# Patient Record
Sex: Female | Born: 1960 | Race: Black or African American | Hispanic: No | Marital: Married | Smoking: Never smoker
Health system: Southern US, Community
[De-identification: ages and names within clinical notes are randomized; demographics above are authoritative.]

## PROBLEM LIST (undated history)

## (undated) ENCOUNTER — Ambulatory Visit (HOSPITAL_COMMUNITY): Disposition: A | Discharge status: Other Acute Inpatient Hospital | Payer: BC Managed Care – PPO

## (undated) DIAGNOSIS — R9431 Abnormal electrocardiogram [ECG] [EKG]: Secondary | ICD-10-CM

## (undated) DIAGNOSIS — E669 Obesity, unspecified: Secondary | ICD-10-CM

## (undated) DIAGNOSIS — J45909 Unspecified asthma, uncomplicated: Secondary | ICD-10-CM

## (undated) DIAGNOSIS — K219 Gastro-esophageal reflux disease without esophagitis: Secondary | ICD-10-CM

## (undated) DIAGNOSIS — E785 Hyperlipidemia, unspecified: Secondary | ICD-10-CM

## (undated) DIAGNOSIS — J189 Pneumonia, unspecified organism: Secondary | ICD-10-CM

## (undated) DIAGNOSIS — F32A Depression, unspecified: Secondary | ICD-10-CM

## (undated) DIAGNOSIS — Z91199 Patient's noncompliance with other medical treatment and regimen due to unspecified reason: Secondary | ICD-10-CM

## (undated) DIAGNOSIS — I10 Essential (primary) hypertension: Secondary | ICD-10-CM

## (undated) DIAGNOSIS — F329 Major depressive disorder, single episode, unspecified: Secondary | ICD-10-CM

## (undated) DIAGNOSIS — Z9119 Patient's noncompliance with other medical treatment and regimen: Secondary | ICD-10-CM

## (undated) DIAGNOSIS — G4730 Sleep apnea, unspecified: Secondary | ICD-10-CM

## (undated) HISTORY — DX: Patient's noncompliance with other medical treatment and regimen due to unspecified reason: Z91.199

## (undated) HISTORY — PX: EYE SURGERY: SHX253

## (undated) HISTORY — PX: KNEE ARTHROSCOPY W/ MENISCAL REPAIR: SHX1877

## (undated) HISTORY — DX: Abnormal electrocardiogram (ECG) (EKG): R94.31

## (undated) HISTORY — DX: Obesity, unspecified: E66.9

## (undated) HISTORY — PX: CARDIAC CATHETERIZATION: SHX172

## (undated) HISTORY — DX: Patient's noncompliance with other medical treatment and regimen: Z91.19

## (undated) HISTORY — DX: Hyperlipidemia, unspecified: E78.5

---

## 1997-08-20 ENCOUNTER — Ambulatory Visit (HOSPITAL_COMMUNITY): Admission: RE | Admit: 1997-08-20 | Discharge: 1997-08-20 | Payer: Self-pay | Admitting: Family Medicine

## 1997-10-04 ENCOUNTER — Encounter: Admission: RE | Admit: 1997-10-04 | Discharge: 1998-01-02 | Payer: Self-pay | Admitting: Obstetrics

## 1997-10-31 ENCOUNTER — Ambulatory Visit (HOSPITAL_COMMUNITY): Admission: RE | Admit: 1997-10-31 | Discharge: 1997-10-31 | Payer: Self-pay | Admitting: Obstetrics

## 1997-12-01 ENCOUNTER — Inpatient Hospital Stay (HOSPITAL_COMMUNITY): Admission: AD | Admit: 1997-12-01 | Discharge: 1997-12-03 | Payer: Self-pay | Admitting: *Deleted

## 1997-12-19 ENCOUNTER — Inpatient Hospital Stay (HOSPITAL_COMMUNITY): Admission: AD | Admit: 1997-12-19 | Discharge: 1997-12-19 | Payer: Self-pay | Admitting: Obstetrics & Gynecology

## 1997-12-25 ENCOUNTER — Encounter: Admission: RE | Admit: 1997-12-25 | Discharge: 1998-03-25 | Payer: Self-pay | Admitting: Obstetrics & Gynecology

## 1998-01-01 ENCOUNTER — Encounter: Admission: RE | Admit: 1998-01-01 | Discharge: 1998-04-01 | Payer: Self-pay | Admitting: Obstetrics & Gynecology

## 1998-01-07 ENCOUNTER — Ambulatory Visit (HOSPITAL_COMMUNITY): Admission: RE | Admit: 1998-01-07 | Discharge: 1998-01-07 | Payer: Self-pay

## 1998-01-15 ENCOUNTER — Encounter: Admission: RE | Admit: 1998-01-15 | Discharge: 1998-04-15 | Payer: Self-pay | Admitting: Obstetrics & Gynecology

## 1998-01-24 ENCOUNTER — Encounter (HOSPITAL_COMMUNITY): Admission: RE | Admit: 1998-01-24 | Discharge: 1998-03-06 | Payer: Self-pay | Admitting: Obstetrics & Gynecology

## 1998-01-31 ENCOUNTER — Ambulatory Visit (HOSPITAL_COMMUNITY): Admission: RE | Admit: 1998-01-31 | Discharge: 1998-01-31 | Payer: Self-pay | Admitting: Obstetrics & Gynecology

## 1998-02-05 ENCOUNTER — Inpatient Hospital Stay (HOSPITAL_COMMUNITY): Admission: AD | Admit: 1998-02-05 | Discharge: 1998-02-08 | Payer: Self-pay | Admitting: Obstetrics

## 1998-02-10 ENCOUNTER — Inpatient Hospital Stay (HOSPITAL_COMMUNITY): Admission: AD | Admit: 1998-02-10 | Discharge: 1998-02-10 | Payer: Self-pay | Admitting: Obstetrics

## 1998-02-16 ENCOUNTER — Inpatient Hospital Stay (HOSPITAL_COMMUNITY): Admission: AD | Admit: 1998-02-16 | Discharge: 1998-02-16 | Payer: Self-pay | Admitting: Obstetrics

## 1998-02-21 ENCOUNTER — Inpatient Hospital Stay (HOSPITAL_COMMUNITY): Admission: AD | Admit: 1998-02-21 | Discharge: 1998-02-24 | Payer: Self-pay | Admitting: Obstetrics & Gynecology

## 1998-03-03 ENCOUNTER — Inpatient Hospital Stay (HOSPITAL_COMMUNITY): Admission: AD | Admit: 1998-03-03 | Discharge: 1998-03-08 | Payer: Self-pay | Admitting: Obstetrics & Gynecology

## 1998-03-09 ENCOUNTER — Inpatient Hospital Stay (HOSPITAL_COMMUNITY): Admission: AD | Admit: 1998-03-09 | Discharge: 1998-03-09 | Payer: Self-pay | Admitting: Obstetrics

## 1998-03-10 ENCOUNTER — Inpatient Hospital Stay (HOSPITAL_COMMUNITY): Admission: AD | Admit: 1998-03-10 | Discharge: 1998-03-13 | Payer: Self-pay | Admitting: Obstetrics

## 1998-10-13 ENCOUNTER — Emergency Department (HOSPITAL_COMMUNITY): Admission: EM | Admit: 1998-10-13 | Discharge: 1998-10-13 | Payer: Self-pay | Admitting: Emergency Medicine

## 1999-12-03 ENCOUNTER — Emergency Department (HOSPITAL_COMMUNITY): Admission: EM | Admit: 1999-12-03 | Discharge: 1999-12-03 | Payer: Self-pay | Admitting: Emergency Medicine

## 2000-07-13 ENCOUNTER — Other Ambulatory Visit: Admission: RE | Admit: 2000-07-13 | Discharge: 2000-07-13 | Payer: Self-pay | Admitting: *Deleted

## 2001-07-03 ENCOUNTER — Encounter: Payer: Self-pay | Admitting: Emergency Medicine

## 2001-07-03 ENCOUNTER — Emergency Department (HOSPITAL_COMMUNITY): Admission: EM | Admit: 2001-07-03 | Discharge: 2001-07-03 | Payer: Self-pay

## 2004-03-02 ENCOUNTER — Other Ambulatory Visit: Admission: RE | Admit: 2004-03-02 | Discharge: 2004-03-02 | Payer: Self-pay | Admitting: Obstetrics and Gynecology

## 2004-09-29 ENCOUNTER — Emergency Department (HOSPITAL_COMMUNITY): Admission: EM | Admit: 2004-09-29 | Discharge: 2004-09-29 | Payer: Self-pay | Admitting: Emergency Medicine

## 2004-12-03 ENCOUNTER — Encounter: Admission: RE | Admit: 2004-12-03 | Discharge: 2004-12-03 | Payer: Self-pay | Admitting: Internal Medicine

## 2006-07-06 ENCOUNTER — Emergency Department (HOSPITAL_COMMUNITY): Admission: EM | Admit: 2006-07-06 | Discharge: 2006-07-06 | Payer: Self-pay | Admitting: Emergency Medicine

## 2008-03-14 ENCOUNTER — Emergency Department (HOSPITAL_COMMUNITY): Admission: EM | Admit: 2008-03-14 | Discharge: 2008-03-15 | Payer: Self-pay | Admitting: Family Medicine

## 2008-03-14 IMAGING — CT CT HEAD W/O CM
1 of 2 series · 13 of 30 positions shown, 17 images · non-contrast
Comparison: None

CLINICAL DATA: Headache.

CT HEAD WITHOUT CONTRAST
TECHNIQUE: Contiguous axial images were obtained from the base of
the skull through the vertex without contrast.

[Series 2: brain · axial · 0.47mm/px · z∈[+128,+258]mm · 13 of 40 slices shown, 17 images]
[im 3/40  brain]
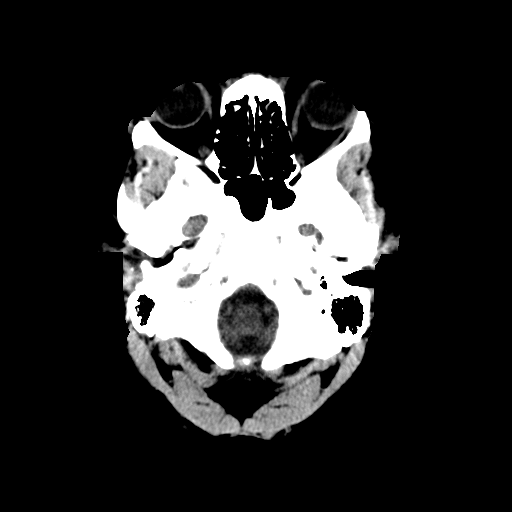
[im 3/40  bone]
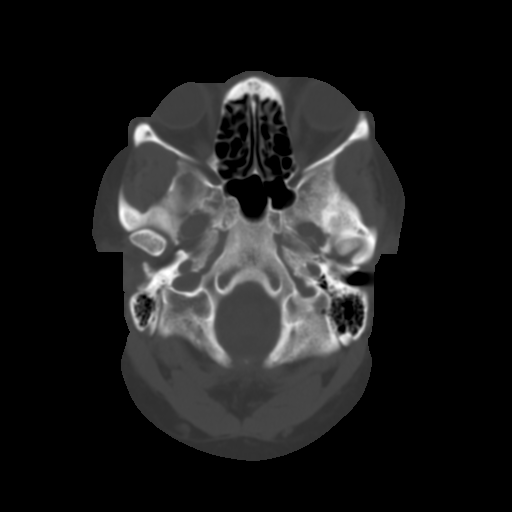
[im 6/40  brain]
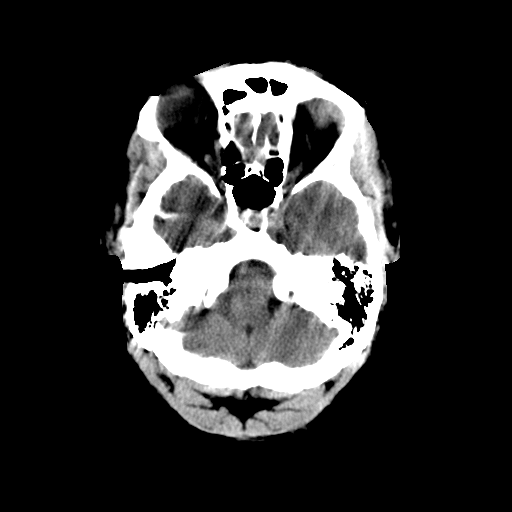
[im 9/40  brain]
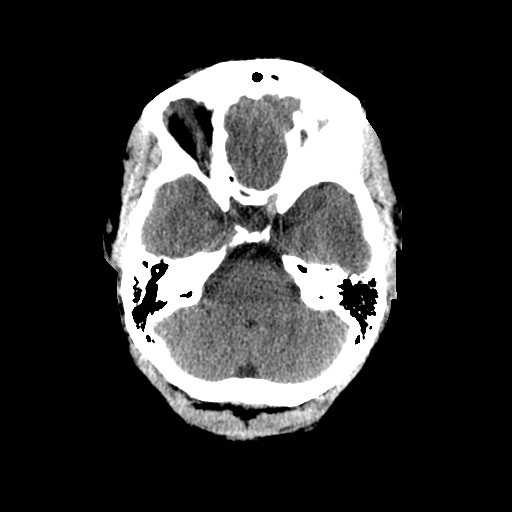
[im 12/40  brain]
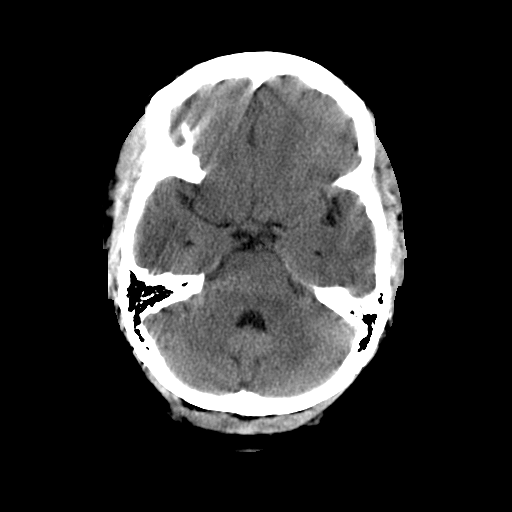
[im 14/40  brain]
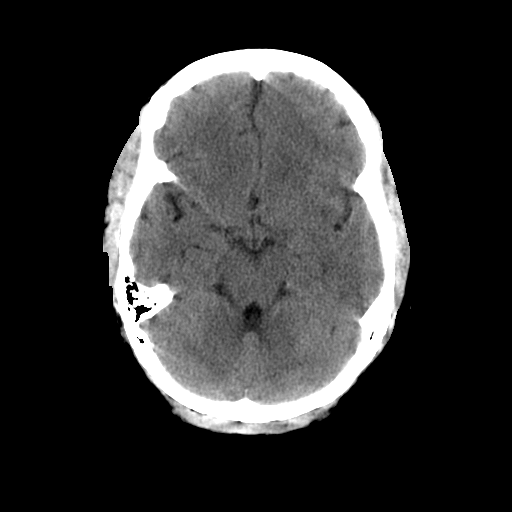
[im 14/40  bone]
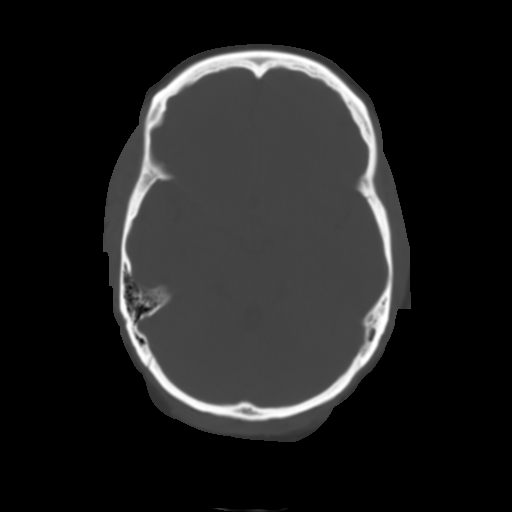
[im 17/40  brain]
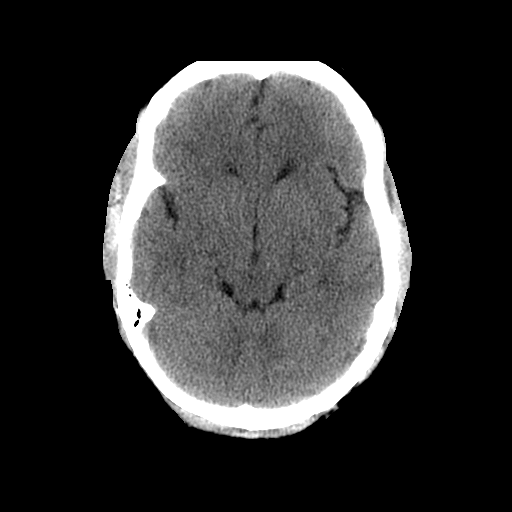
[im 20/40  brain]
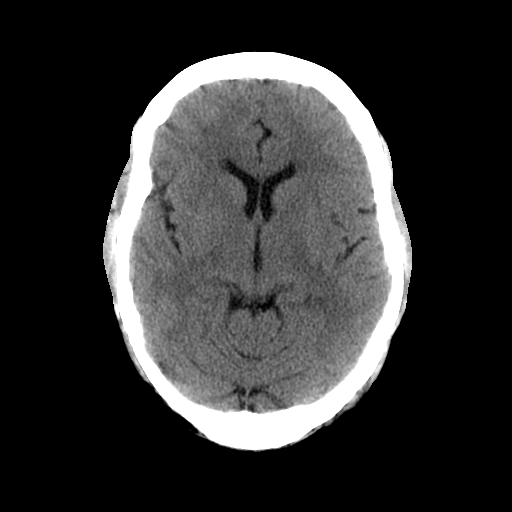
[im 23/40  brain]
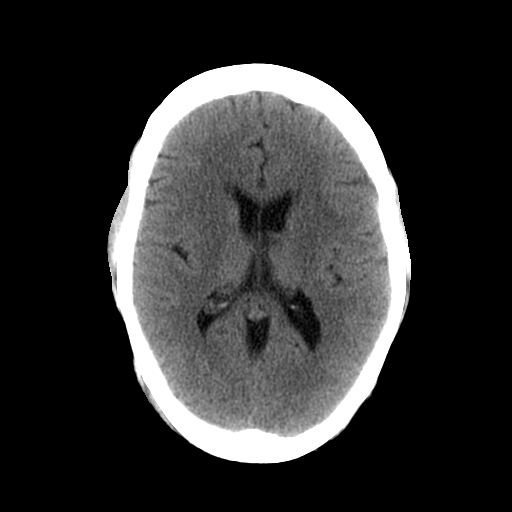
[im 26/40  brain]
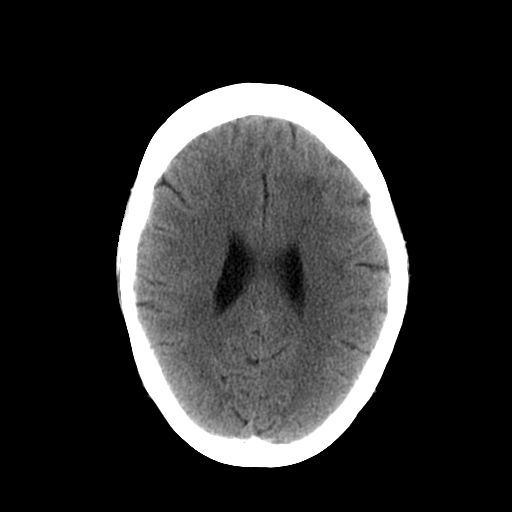
[im 26/40  bone]
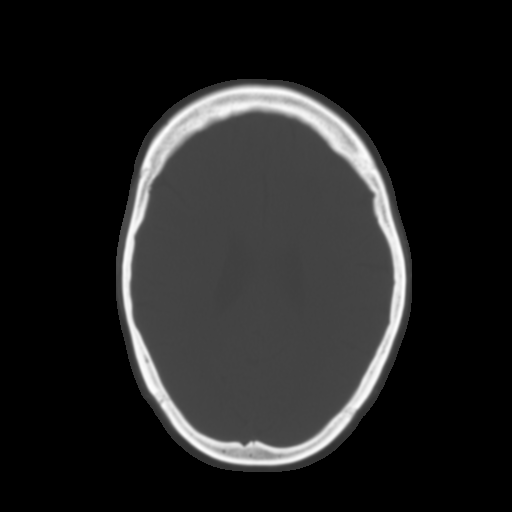
[im 28/40  brain]
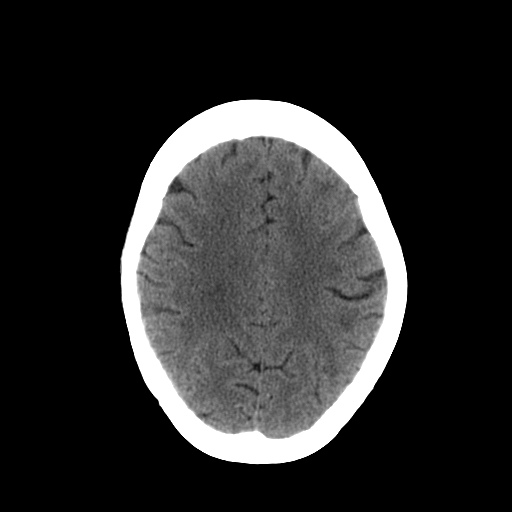
[im 31/40  brain]
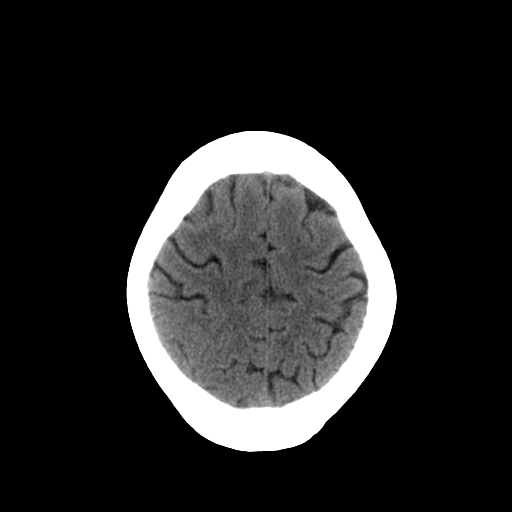
[im 34/40  brain]
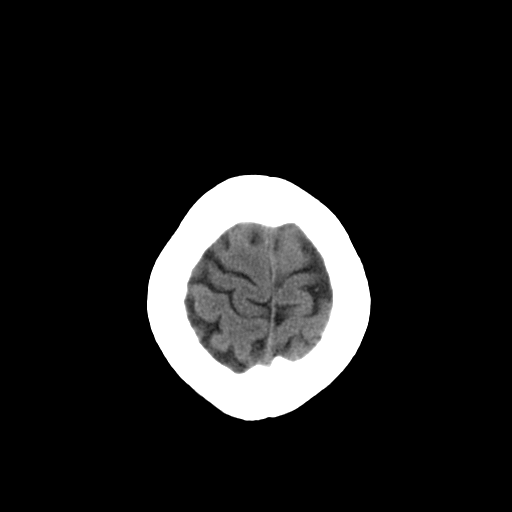
[im 37/40  brain]
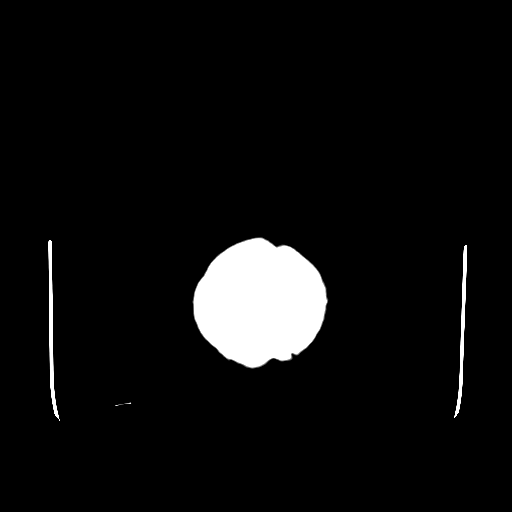
[im 37/40  bone]
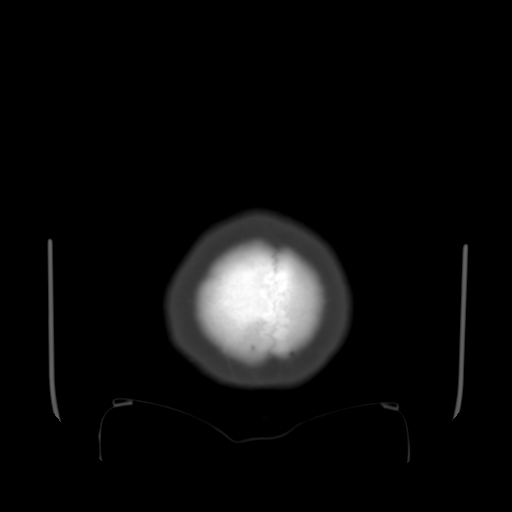

[13 of 30 positions shown; findings below may reference images not displayed]

FINDINGS: Mild scattered white matter hypodensities bilaterally are
indeterminate.

No acute intracranial abnormalities are identified, including mass
lesion or mass effect, hydrocephalus, extra-axial fluid collection,
midline shift, hemorrhage, or acute infarction.  Please note that
acute infarction may be occult on CT for 24-48 hours.

The visualized bony calvarium is unremarkable.
IMPRESSION: No evidence of acute intracranial abnormality.

Mild bilateral white matter hypodensities - likely chronic small
vessel white matter ischemic changes.    Differential also includes
inflammatory processes and demyelinating lesions.  Consider
elective MRI for further evaluation as indicated.

## 2008-03-15 IMAGING — CR DG CHEST 2V
2 series · 2 of 2 positions shown · non-contrast
Comparison: None

CLINICAL DATA: Chest pain

CHEST - 2 VIEW

[w chest pa]
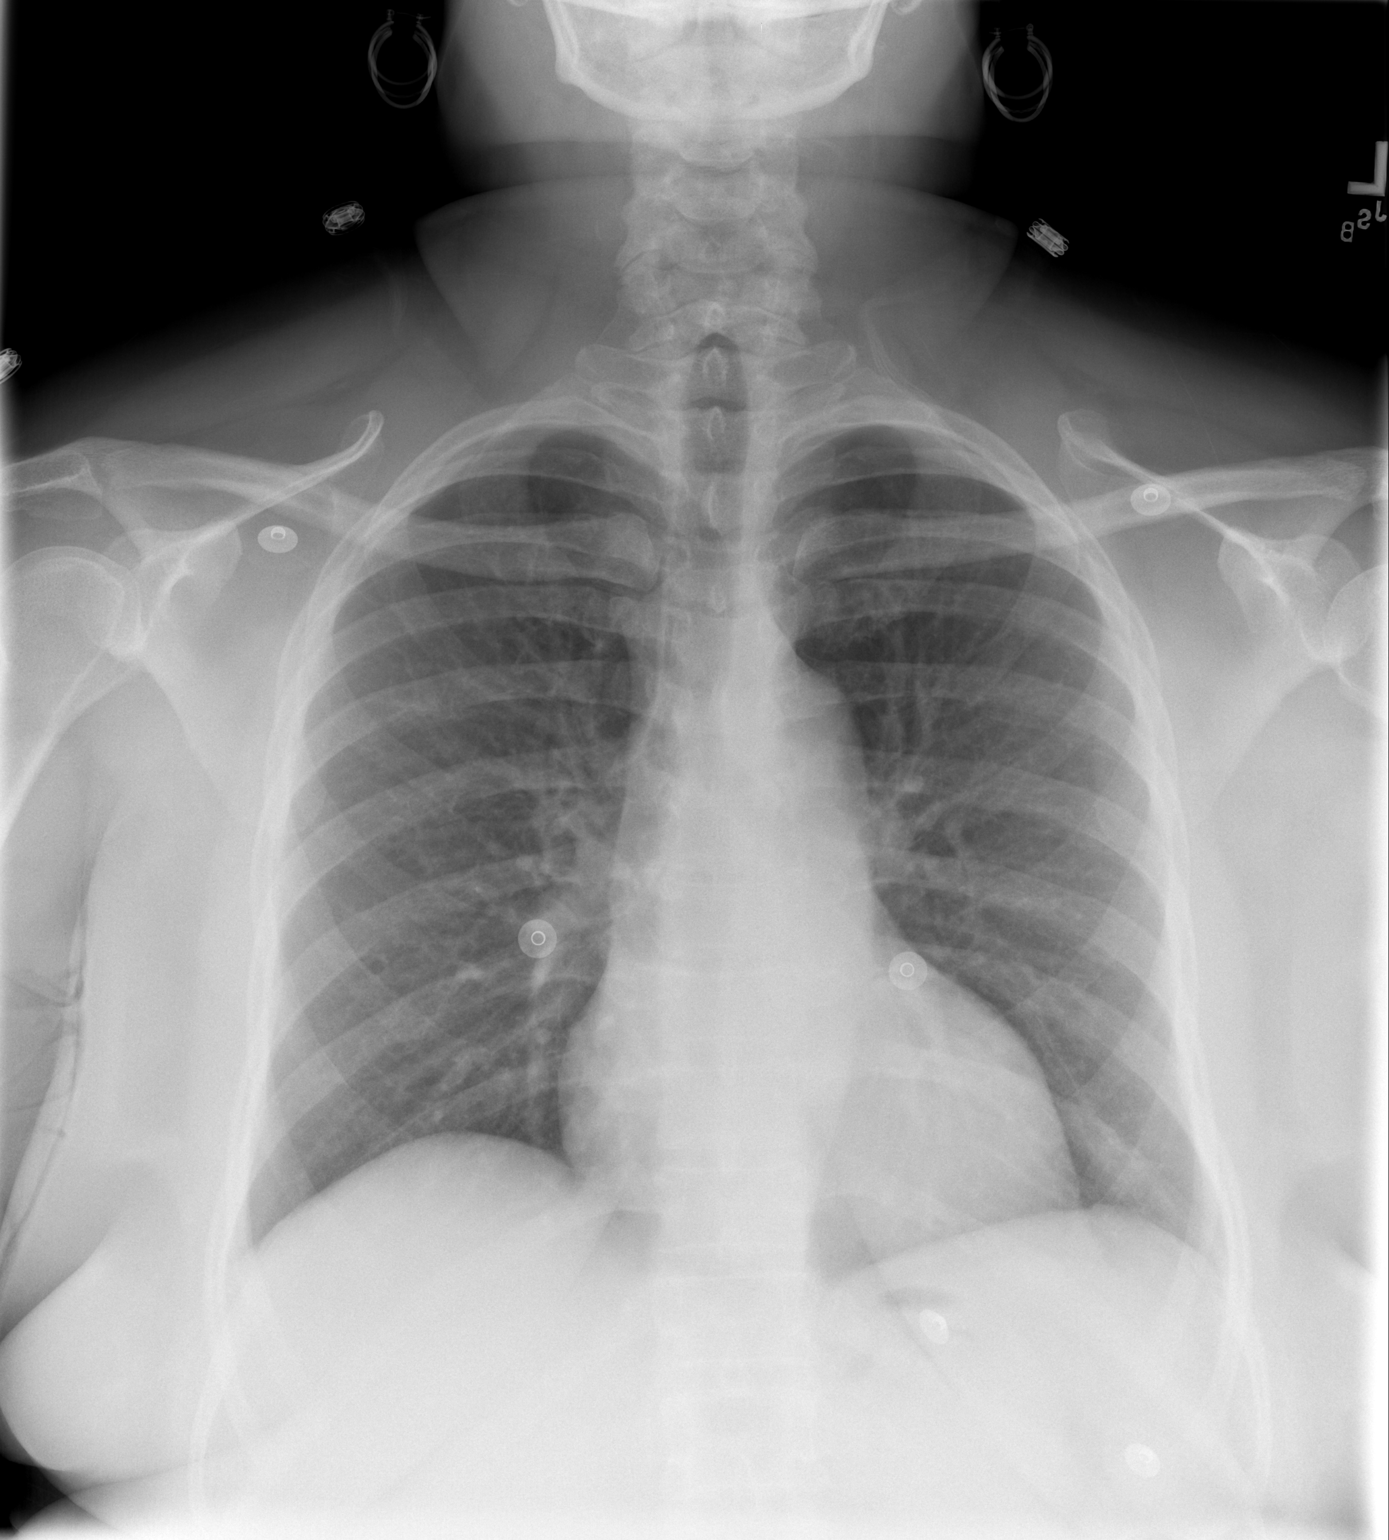

[w chest lat]
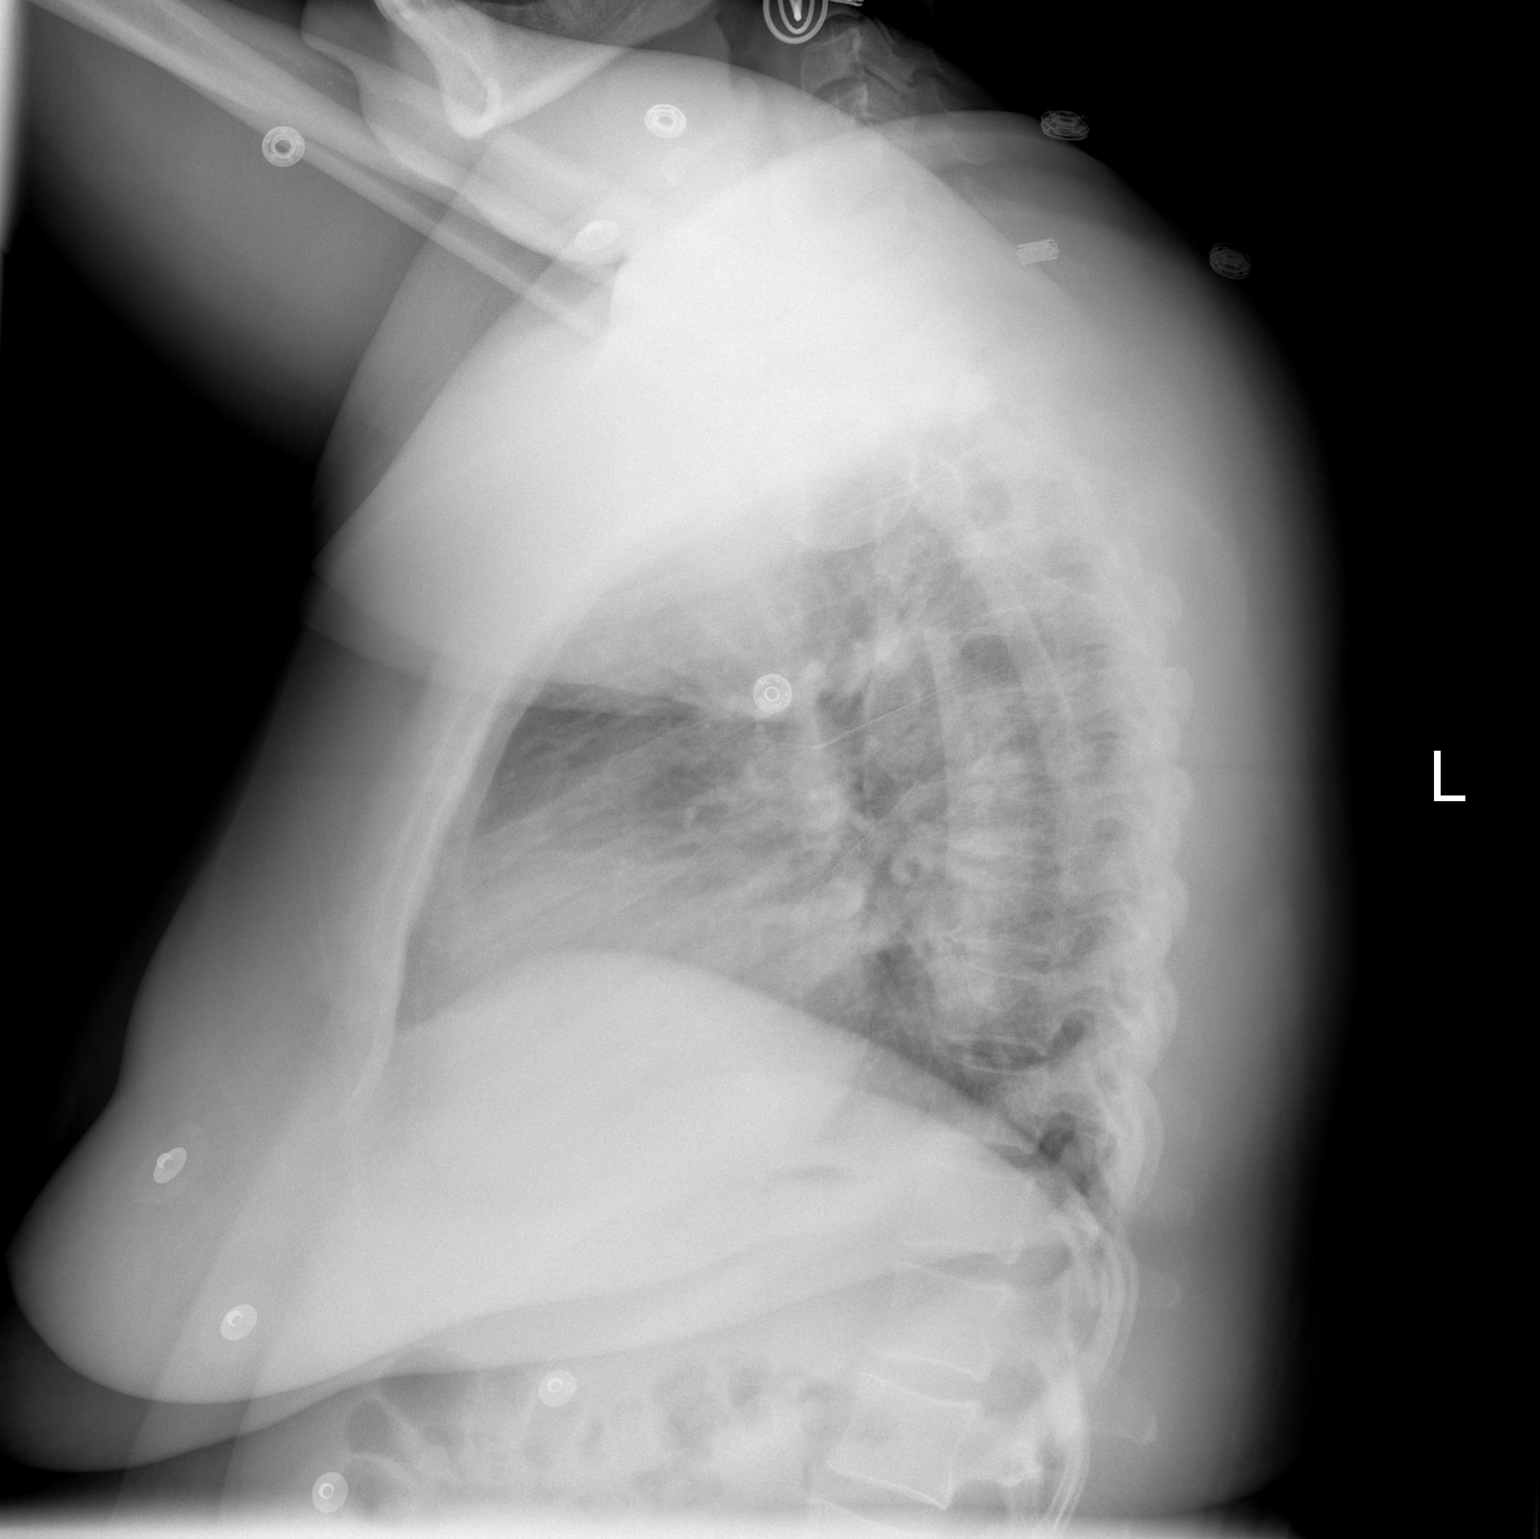

[2 of 2 positions shown; findings below may reference images not displayed]

FINDINGS: The cardiac and mediastinal contours are within normal.
The lungs are clear.  The osseous  structures are unremarkable.
IMPRESSION: No active cardiopulmonary disease.

## 2008-03-16 ENCOUNTER — Inpatient Hospital Stay (HOSPITAL_COMMUNITY): Admission: EM | Admit: 2008-03-16 | Discharge: 2008-03-16 | Payer: Self-pay | Admitting: Emergency Medicine

## 2008-04-19 ENCOUNTER — Encounter: Admission: RE | Admit: 2008-04-19 | Discharge: 2008-04-19 | Payer: Self-pay | Admitting: Internal Medicine

## 2008-10-07 ENCOUNTER — Emergency Department (HOSPITAL_COMMUNITY): Admission: EM | Admit: 2008-10-07 | Discharge: 2008-10-07 | Payer: Self-pay | Admitting: Emergency Medicine

## 2008-11-27 ENCOUNTER — Emergency Department (HOSPITAL_COMMUNITY): Admission: EM | Admit: 2008-11-27 | Discharge: 2008-11-27 | Payer: Self-pay | Admitting: Family Medicine

## 2009-08-09 ENCOUNTER — Emergency Department (HOSPITAL_COMMUNITY): Admission: EM | Admit: 2009-08-09 | Discharge: 2009-08-09 | Payer: Self-pay | Admitting: Emergency Medicine

## 2009-08-09 IMAGING — CR DG KNEE COMPLETE 4+V*R*
4 series · 4 of 4 positions shown · non-contrast
Comparison: None

CLINICAL DATA: Pain

RIGHT KNEE - COMPLETE 4+ VIEW

[view not recorded (1 of 4)]
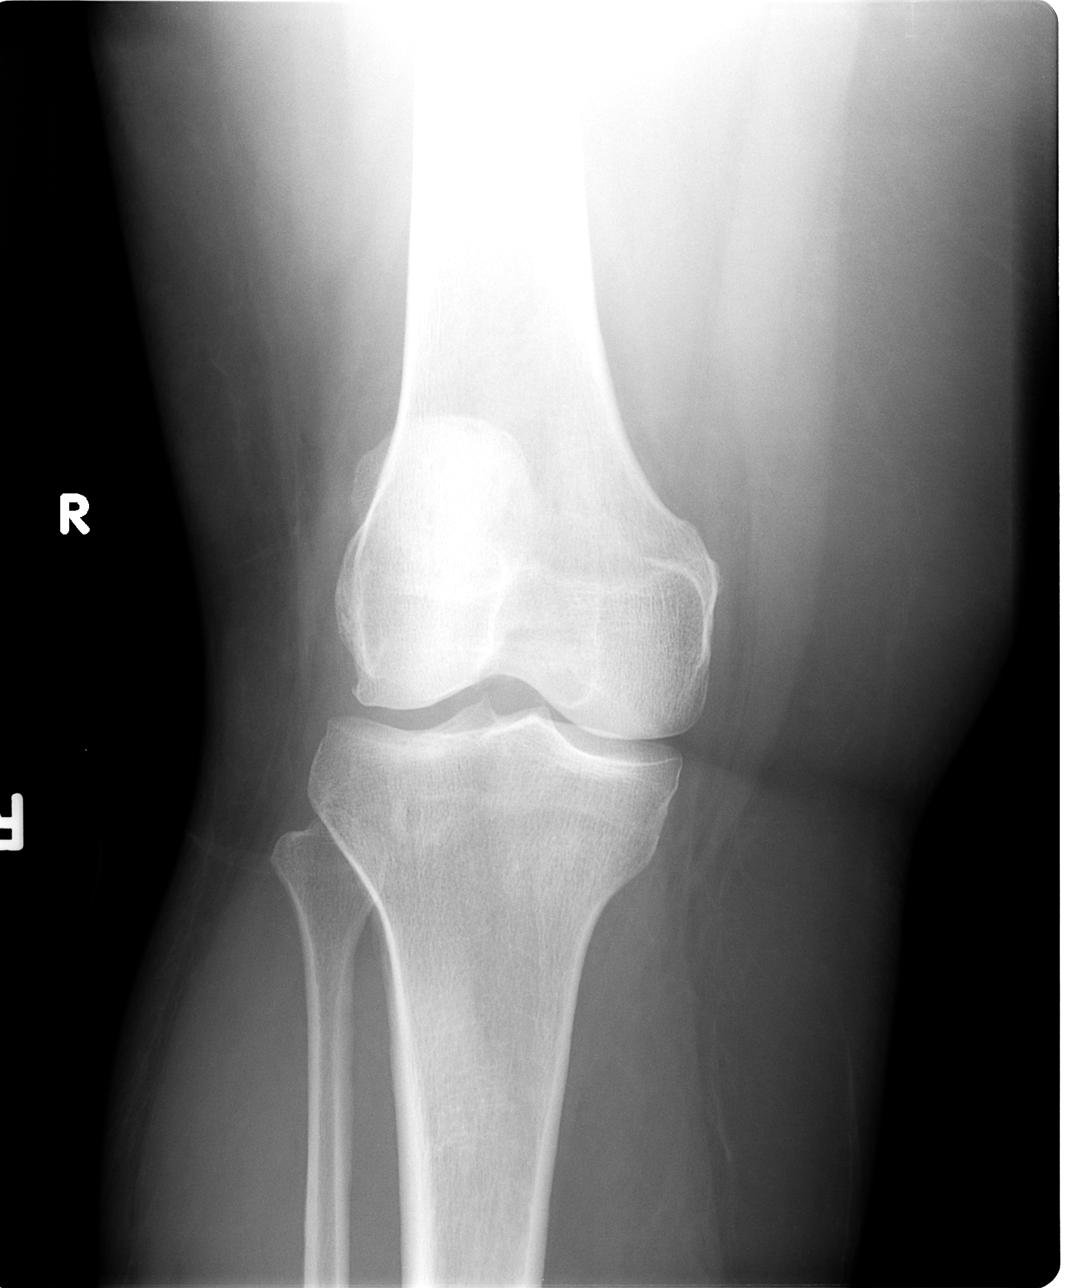

[view not recorded (2 of 4)]
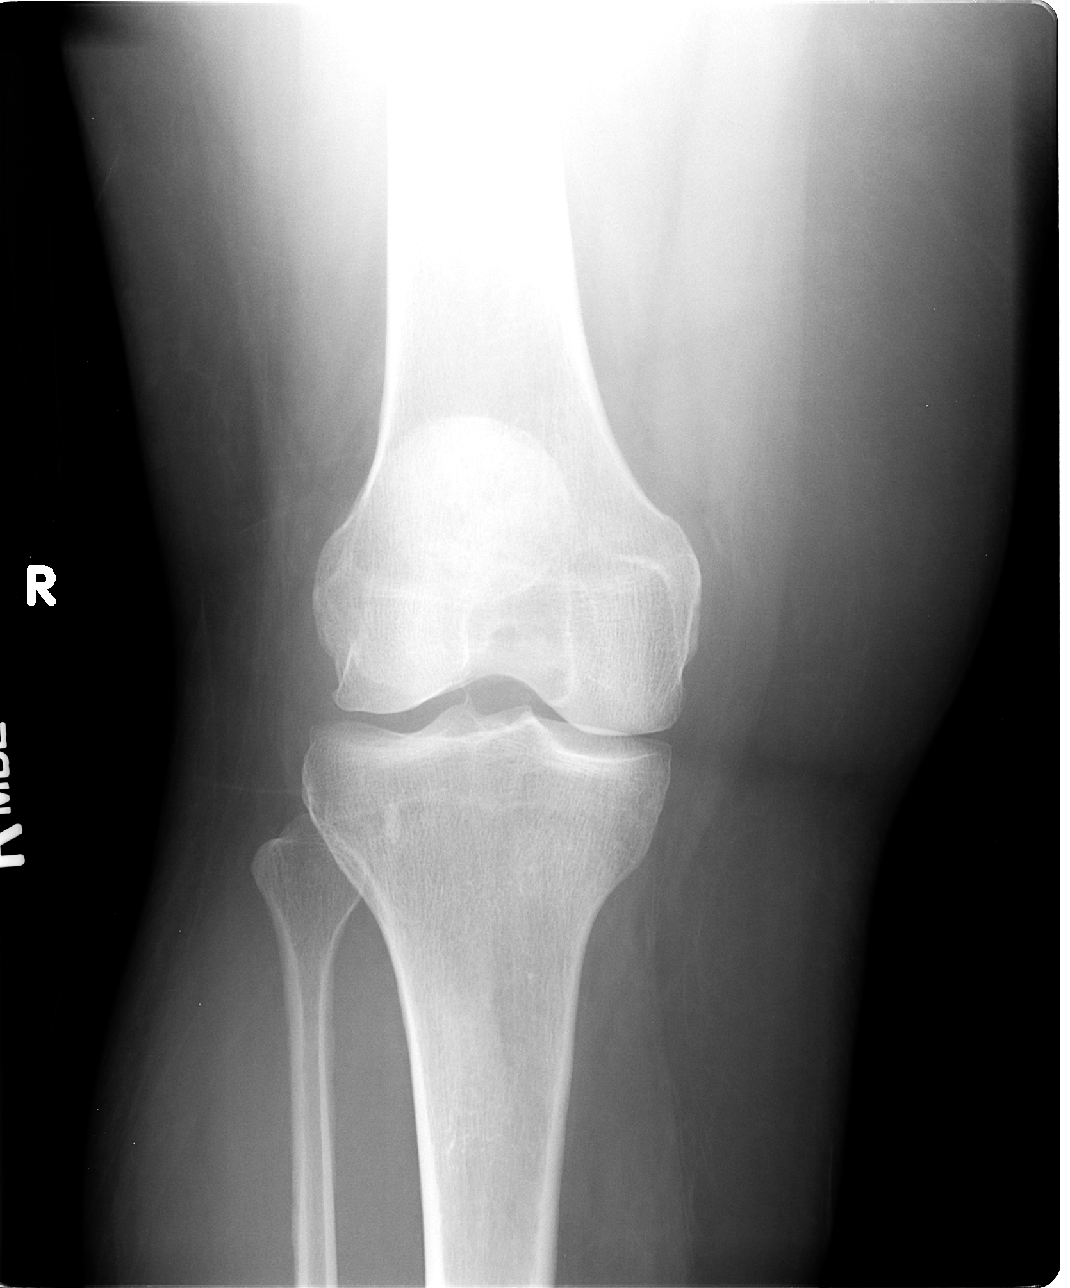

[view not recorded (3 of 4)]
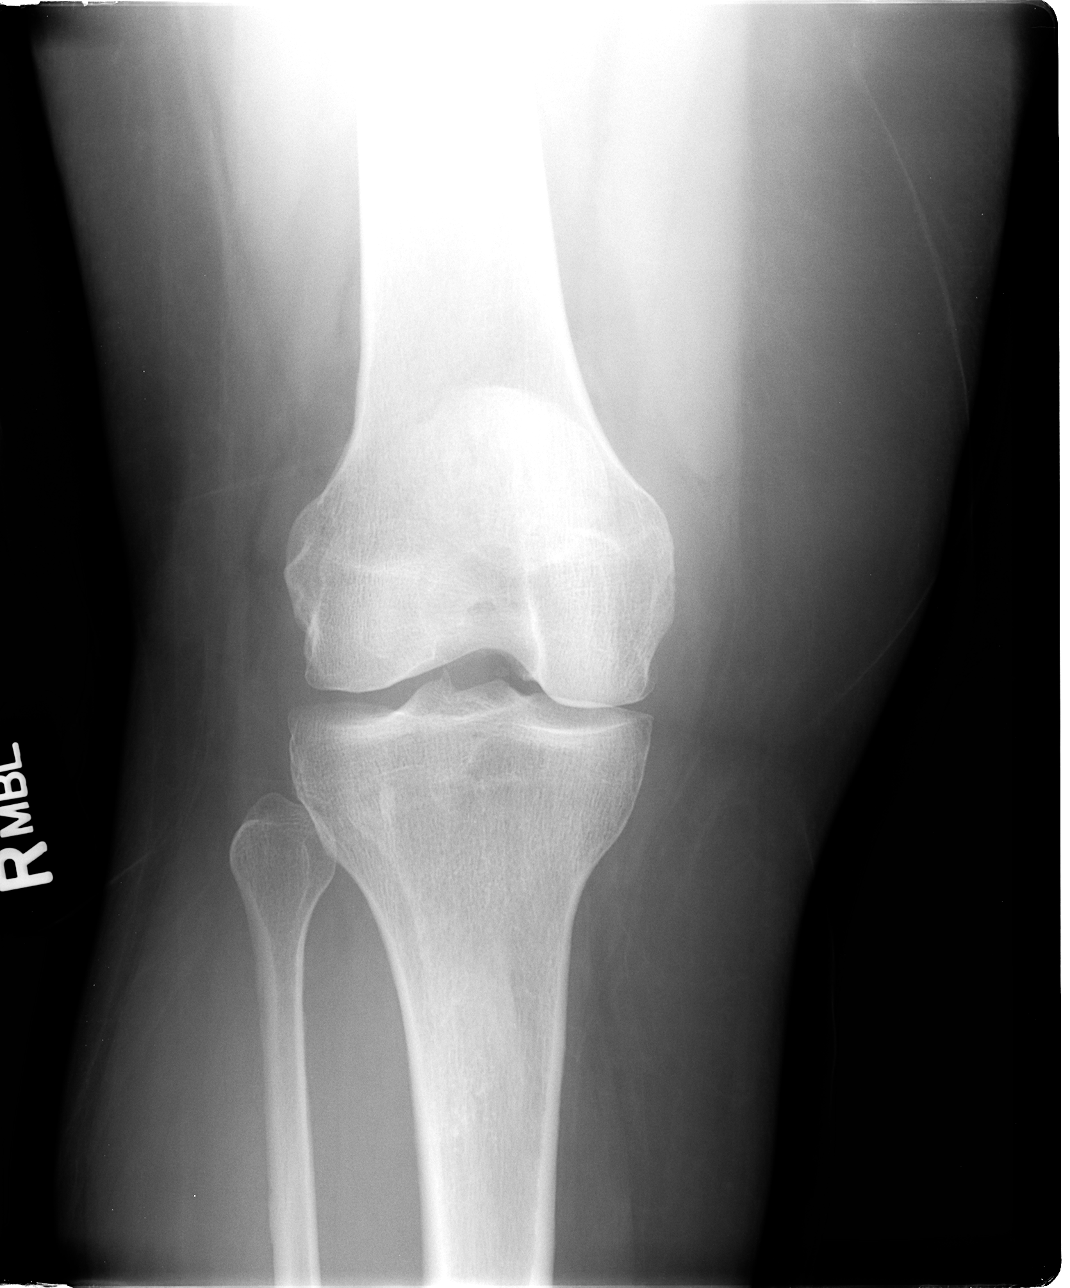

[view not recorded (4 of 4)]
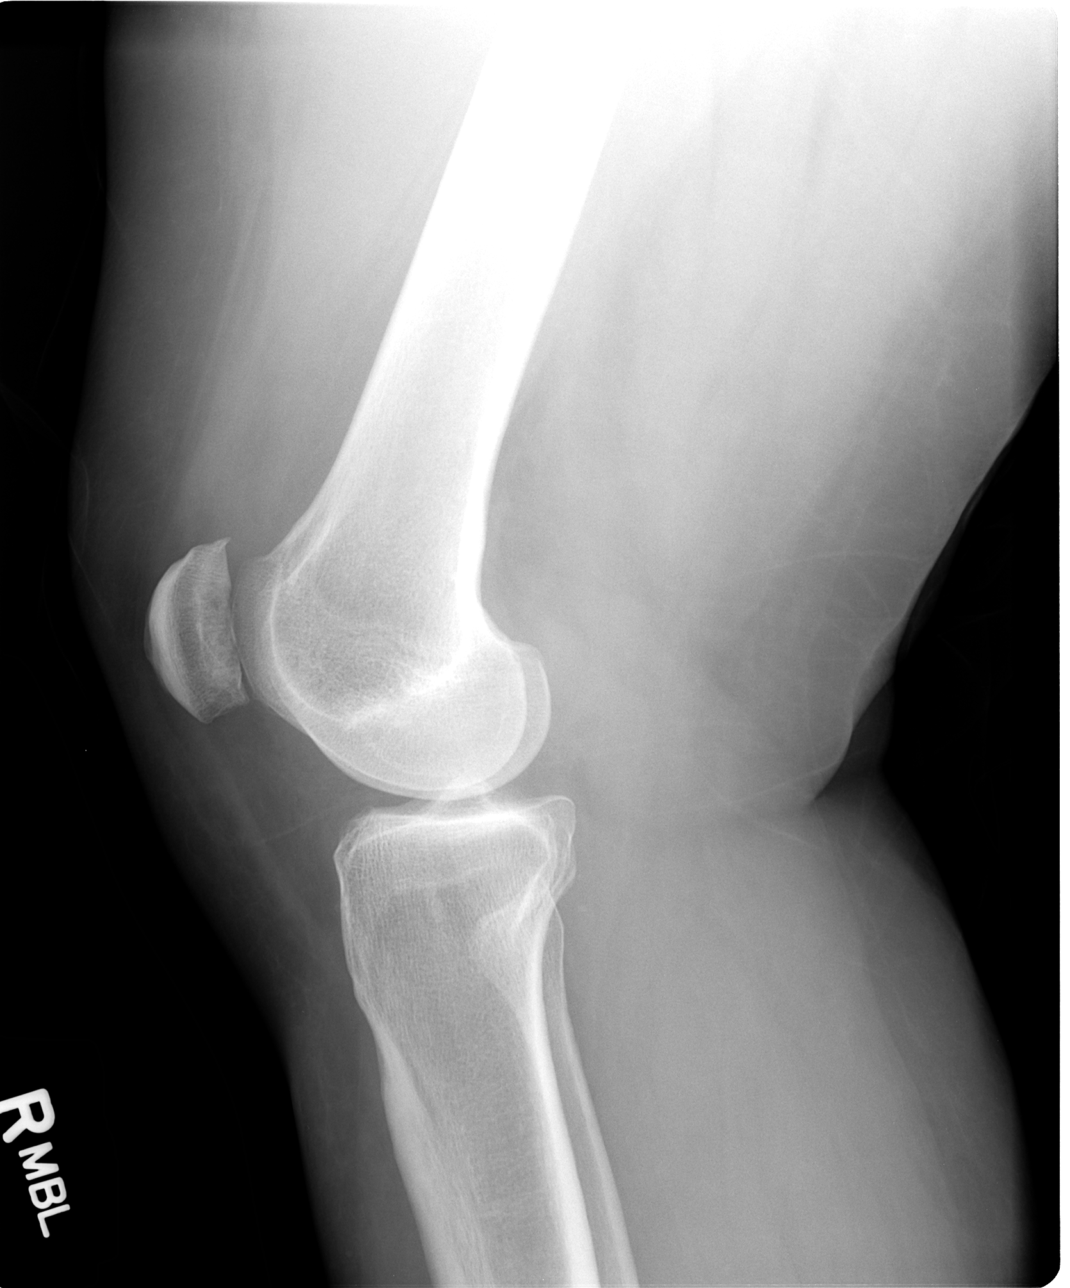

[4 of 4 positions shown; findings below may reference images not displayed]

FINDINGS: There is a small knee joint effusion.  There are changes
of chondromalacia of the patellofemoral joint.  There is
preservation of the medial and lateral weightbearing compartments.
There are small lateral compartment osteophytes.
IMPRESSION: Small joint effusion.  Changes of chondromalacia of the
patellofemoral joint with irregularity of the posterior surface of
the patella.

## 2010-05-16 ENCOUNTER — Emergency Department (HOSPITAL_COMMUNITY): Admission: EM | Admit: 2010-05-16 | Discharge: 2010-05-16 | Payer: Self-pay | Admitting: Emergency Medicine

## 2010-08-13 ENCOUNTER — Emergency Department (HOSPITAL_COMMUNITY)
Admission: EM | Admit: 2010-08-13 | Discharge: 2010-08-13 | Disposition: A | Discharge status: Home / Self Care | Payer: 59 | Attending: Emergency Medicine | Admitting: Emergency Medicine

## 2010-08-13 DIAGNOSIS — G44209 Tension-type headache, unspecified, not intractable: Secondary | ICD-10-CM | POA: Insufficient documentation

## 2010-08-13 DIAGNOSIS — Z79899 Other long term (current) drug therapy: Secondary | ICD-10-CM | POA: Insufficient documentation

## 2010-08-13 DIAGNOSIS — I10 Essential (primary) hypertension: Secondary | ICD-10-CM | POA: Insufficient documentation

## 2010-08-13 DIAGNOSIS — R109 Unspecified abdominal pain: Secondary | ICD-10-CM | POA: Insufficient documentation

## 2010-08-13 DIAGNOSIS — N898 Other specified noninflammatory disorders of vagina: Secondary | ICD-10-CM | POA: Insufficient documentation

## 2010-08-13 LAB — WET PREP, GENITAL
Clue Cells Wet Prep HPF POC: NONE SEEN
Trich, Wet Prep: NONE SEEN
WBC, Wet Prep HPF POC: NONE SEEN
Yeast Wet Prep HPF POC: NONE SEEN

## 2010-08-13 LAB — POCT I-STAT, CHEM 8
Chloride: 104 mEq/L (ref 96–112)
HCT: 35 % — ABNORMAL LOW (ref 36.0–46.0)
Potassium: 3.2 mEq/L — ABNORMAL LOW (ref 3.5–5.1)
Sodium: 140 mEq/L (ref 135–145)

## 2010-10-13 LAB — POCT URINALYSIS DIP (DEVICE)
Glucose, UA: NEGATIVE mg/dL
Hgb urine dipstick: NEGATIVE
Protein, ur: 100 mg/dL — AB
Specific Gravity, Urine: 1.02 (ref 1.005–1.030)
Urobilinogen, UA: 0.2 mg/dL (ref 0.0–1.0)
pH: 6.5 (ref 5.0–8.0)

## 2010-10-13 LAB — WET PREP, GENITAL

## 2010-10-13 LAB — GC/CHLAMYDIA PROBE AMP, GENITAL: Chlamydia, DNA Probe: NEGATIVE

## 2010-10-13 LAB — URINE CULTURE: Colony Count: 9000

## 2010-10-14 LAB — GLUCOSE, CAPILLARY

## 2011-01-15 ENCOUNTER — Inpatient Hospital Stay (INDEPENDENT_AMBULATORY_CARE_PROVIDER_SITE_OTHER)
Admission: RE | Admit: 2011-01-15 | Discharge: 2011-01-15 | Disposition: A | Discharge status: Home / Self Care | Payer: 59 | Source: Ambulatory Visit | Attending: Family Medicine | Admitting: Family Medicine

## 2011-01-15 DIAGNOSIS — J069 Acute upper respiratory infection, unspecified: Secondary | ICD-10-CM

## 2011-04-07 LAB — POCT CARDIAC MARKERS
CKMB, poc: 1.6
Troponin i, poc: <0.05

## 2011-04-07 LAB — ANA: Anti Nuclear Antibody(ANA): POSITIVE — AB

## 2011-04-07 LAB — POCT I-STAT, CHEM 8
BUN: 14
Calcium, Ion: 1.23
Chloride: 104
Creatinine, Ser: 0.9
Glucose, Bld: 116 — ABNORMAL HIGH
HCT: 30 — ABNORMAL LOW
Potassium: 3.4 — ABNORMAL LOW
TCO2: 24

## 2011-04-07 LAB — CBC
HCT: 31.1 — ABNORMAL LOW
Hemoglobin: 9.8 — ABNORMAL LOW
WBC: 12.2 — ABNORMAL HIGH

## 2011-04-07 LAB — CARDIAC PANEL(CRET KIN+CKTOT+MB+TROPI): CK, MB: 1.6

## 2011-04-07 LAB — DIFFERENTIAL
Eosinophils Relative: 0
Lymphocytes Relative: 11 — ABNORMAL LOW
Lymphs Abs: 1.4
Monocytes Absolute: 0.7

## 2011-04-07 LAB — TSH: TSH: 1.079

## 2011-04-07 LAB — CK TOTAL AND CKMB (NOT AT ARMC)
CK, MB: 1.4
Relative Index: 0.7

## 2011-04-07 LAB — ANTI-NUCLEAR AB-TITER (ANA TITER)

## 2011-10-08 ENCOUNTER — Other Ambulatory Visit: Payer: Self-pay

## 2011-10-08 ENCOUNTER — Encounter (HOSPITAL_COMMUNITY): Payer: Self-pay | Admitting: Emergency Medicine

## 2011-10-08 ENCOUNTER — Emergency Department (HOSPITAL_COMMUNITY)
Admission: EM | Admit: 2011-10-08 | Discharge: 2011-10-08 | Disposition: A | Discharge status: Home / Self Care | Payer: 59 | Attending: Emergency Medicine | Admitting: Emergency Medicine

## 2011-10-08 DIAGNOSIS — I10 Essential (primary) hypertension: Secondary | ICD-10-CM | POA: Insufficient documentation

## 2011-10-08 DIAGNOSIS — E119 Type 2 diabetes mellitus without complications: Secondary | ICD-10-CM | POA: Insufficient documentation

## 2011-10-08 DIAGNOSIS — R51 Headache: Secondary | ICD-10-CM | POA: Insufficient documentation

## 2011-10-08 HISTORY — DX: Essential (primary) hypertension: I10

## 2011-10-08 HISTORY — DX: Gastro-esophageal reflux disease without esophagitis: K21.9

## 2011-10-08 LAB — GLUCOSE, CAPILLARY: Glucose-Capillary: 166 mg/dL — ABNORMAL HIGH (ref 70–99)

## 2011-10-08 MED ORDER — GLUCOSE BLOOD VI STRP: ORAL_STRIP | Status: AC

## 2011-10-08 NOTE — ED Provider Notes (Signed)
History     CSN: [id number]  Arrival date & time 10/08/11  6295   First MD Initiated Contact with Patient 10/08/11 [phone]      Chief Complaint  Patient presents with  . Headache    (Consider location/radiation/quality/duration/timing/severity/associated sxs/prior treatment) HPI History provided by pt.   Pt has h/o HTN for which she is compliant w/ her benicar-hct.  Had a pre-employment physical yesterday and BP was 188/110.  Checked BP at home yesterday evening and 210/109.  She woke w/ mild pain on top of her head as well as nausea and concerned that it may be related to BP.  Denies vision changes, abd pain, diarrhea, SOB, peripheral edema.  Occasionally and chronically experiences chest pain when she gets up to use the bathroom in the middle of the night.  Has been riding an exercise bike recently and never experiences CP/SOB at that time.    Past Medical History  Diagnosis Date  . Hypertension   . Diabetes mellitus   . Acid reflux     No past surgical history on file.  No family history on file.  History  Substance Use Topics  . Smoking status: Never Smoker   . Smokeless tobacco: Not on file  . Alcohol Use: No    OB History    Grav Para Term Preterm Abortions TAB SAB Ect Mult Living                  Review of Systems  All other systems reviewed and are negative.    Allergies  Review of patient's allergies indicates no known allergies.  Home Medications   Current Outpatient Rx  Name Route Sig Dispense Refill  . METFORMIN HCL 500 MG PO TABS Oral Take 500 mg by mouth 2 (two) times daily with a meal.    . ADULT MULTIVITAMIN W/MINERALS CH Oral Take 1 tablet by mouth daily.    Marland Kitchen OLMESARTAN MEDOXOMIL-HCTZ 40-25 MG PO TABS Oral Take 1 tablet by mouth daily.      BP 158/81  Pulse 97  Temp(Src) 98.4 F (36.9 C) (Oral)  Resp 16  SpO2 99%  Physical Exam  Nursing note and vitals reviewed. Constitutional: She is oriented to person, place, and time. She appears  well-developed and well-nourished. No distress.  HENT:  Head: Normocephalic.  Eyes:       Normal appearance  Neck: Normal range of motion.  Cardiovascular: Normal rate, regular rhythm and intact distal pulses.   Pulmonary/Chest: Effort normal and breath sounds normal.  Abdominal: Soft. Bowel sounds are normal. She exhibits no distension and no mass. There is no tenderness. There is no rebound and no guarding.  Musculoskeletal: Normal range of motion.       No peripheral edema  Neurological: She is alert and oriented to person, place, and time. No cranial nerve deficit or sensory deficit. Coordination normal.       5/5 and equal upper and lower extremity strength.  No past pointing.    Skin: Skin is warm and dry. No rash noted.  Psychiatric: She has a normal mood and affect. Her behavior is normal.    ED Course  Procedures (including critical care time)   Date: 10/08/2011  Rate: 87   Rhythm: normal sinus rhythm  QRS Axis: normal  Intervals: normal  ST/T Wave abnormalities: nonspecific T wave changes  Conduction Disutrbances:none  Narrative Interpretation:   Old EKG Reviewed: unchanged   Labs Reviewed  GLUCOSE, CAPILLARY - Abnormal; Notable for the following:  Glucose-Capillary 166 (*)    All other components within normal limits   No results found.   1. Hypertension       MDM  Pt w/ h/o HTN presents w/ elevated BP.  Has had mild headache since waking this am and also c/o chronic, occasional, atypical CP.  Triage BP 158/81 and no signs of hypertensive emergency on exam.  Will obtain an EKG and then d/c home to f/u with her PCP for BP recheck.  Return precautions discussed.        Otilio Miu, [place] 10/08/11 [phone]

## 2011-10-08 NOTE — ED Notes (Signed)
Took BP this am before taking BP med & stated was very high. No voiced complaints.Denies pain, h/a presently

## 2011-10-08 NOTE — Discharge Instructions (Signed)
Follow up with Dr. Concepcion Elk next week for blood pressure recheck.  He may wish to change dose of your benicar or add a second medication.  You should return to the ER if you develop severe headache, vision changes, change in chest pain or shortness of breath.Hypertension Information As your heart beats, it forces blood through your arteries. This force is your blood pressure. If the pressure is too high, it is called hypertension (HTN) or high blood pressure. HTN is dangerous because you may have it and not know it. High blood pressure may mean that your heart has to work harder to pump blood. Your arteries may be narrow or stiff. The extra work puts you at risk for heart disease, stroke, and other problems.  Blood pressure consists of two numbers, a higher number over a lower, 110/72, for example. It is stated as "110 over 72." The ideal is below 120 for the top number (systolic) and under 80 for the bottom (diastolic).  You should pay close attention to your blood pressure if you have certain conditions such as:  Heart failure.   Prior heart attack.   Diabetes   Chronic kidney disease.   Prior stroke.   Multiple risk factors for heart disease.  To see if you have HTN, your blood pressure should be measured while you are seated with your arm held at the level of the heart. It should be measured at least twice. A one-time elevated blood pressure reading (especially in the Emergency Department) does not mean that you need treatment. There may be conditions in which the blood pressure is different between your right and left arms. It is important to see your caregiver soon for a recheck. Most people have essential hypertension which means that there is not a specific cause. This type of high blood pressure may be lowered by changing lifestyle factors such as:  Stress.   Smoking.   Lack of exercise.   Excessive weight.   Drug/tobacco/alcohol use.   Eating less salt.  Most people do not have  symptoms from high blood pressure until it has caused damage to the body. Effective treatment can often prevent, delay or reduce that damage. TREATMENT  Treatment for high blood pressure, when a cause has been identified, is directed at the cause. There are a large number of medications to treat HTN. These fall into several categories, and your caregiver will help you select the medicines that are best for you. Medications may have side effects. You should review side effects with your caregiver. If your blood pressure stays high after you have made lifestyle changes or started on medicines,   Your medication(s) may need to be changed.   Other problems may need to be addressed.   Be certain you understand your prescriptions, and know how and when to take your medicine.   Be sure to follow up with your caregiver within the time frame advised (usually within two weeks) to have your blood pressure rechecked and to review your medications.   If you are taking more than one medicine to lower your blood pressure, make sure you know how and at what times they should be taken. Taking two medicines at the same time can result in blood pressure that is too low.  Document Released: 08/24/2005 Document Revised: 03/03/2011 Document Reviewed: 08/31/2007 [facility] Patient Information 2012 [place], [place].

## 2011-10-08 NOTE — ED Notes (Signed)
Has been having h/a and she checked her bp and it was high checking it on her sisters bp cuff 210/109 pt is concerned has been taking her bp meds

## 2011-10-09 NOTE — ED Provider Notes (Signed)
Medical screening examination/treatment/procedure(s) were performed by non-physician practitioner and as supervising physician I was immediately available for consultation/collaboration.   Anwitha Mapes Y. Jafet Wissing, MD 10/09/11 1028 

## 2012-07-24 ENCOUNTER — Encounter (HOSPITAL_COMMUNITY): Payer: Self-pay | Admitting: *Deleted

## 2012-07-24 DIAGNOSIS — E119 Type 2 diabetes mellitus without complications: Secondary | ICD-10-CM | POA: Insufficient documentation

## 2012-07-24 DIAGNOSIS — Z3202 Encounter for pregnancy test, result negative: Secondary | ICD-10-CM | POA: Insufficient documentation

## 2012-07-24 DIAGNOSIS — I10 Essential (primary) hypertension: Secondary | ICD-10-CM | POA: Insufficient documentation

## 2012-07-24 DIAGNOSIS — Z79899 Other long term (current) drug therapy: Secondary | ICD-10-CM | POA: Insufficient documentation

## 2012-07-24 DIAGNOSIS — Z8719 Personal history of other diseases of the digestive system: Secondary | ICD-10-CM | POA: Insufficient documentation

## 2012-07-24 DIAGNOSIS — R51 Headache: Secondary | ICD-10-CM | POA: Insufficient documentation

## 2012-07-24 LAB — CBC WITH DIFFERENTIAL/PLATELET
Eosinophils Absolute: 0.1 10*3/uL (ref 0.0–0.7)
Hemoglobin: 13 g/dL (ref 12.0–15.0)
Lymphocytes Relative: 40 % (ref 12–46)
Lymphs Abs: 2.2 10*3/uL (ref 0.7–4.0)
MCH: 29.5 pg (ref 26.0–34.0)
MCV: 85.7 fL (ref 78.0–100.0)
Monocytes Relative: 8 % (ref 3–12)
Neutrophils Relative %: 49 % (ref 43–77)
RBC: 4.41 MIL/uL (ref 3.87–5.11)

## 2012-07-24 LAB — URINALYSIS, ROUTINE W REFLEX MICROSCOPIC
Bilirubin Urine: NEGATIVE
Ketones, ur: NEGATIVE mg/dL
Nitrite: NEGATIVE
Protein, ur: NEGATIVE mg/dL
Urobilinogen, UA: 0.2 mg/dL (ref 0.0–1.0)

## 2012-07-24 LAB — COMPREHENSIVE METABOLIC PANEL
Alkaline Phosphatase: 47 U/L (ref 39–117)
BUN: 14 mg/dL (ref 6–23)
CO2: 26 mEq/L (ref 19–32)
GFR calc Af Amer: >90 mL/min (ref >90)
GFR calc non Af Amer: >90 mL/min (ref >90)
Glucose, Bld: 139 mg/dL — ABNORMAL HIGH (ref 70–99)
Potassium: 3.1 mEq/L — ABNORMAL LOW (ref 3.5–5.1)
Total Bilirubin: 0.3 mg/dL (ref 0.3–1.2)
Total Protein: 8.3 g/dL (ref 6.0–8.3)

## 2012-07-24 LAB — TROPONIN I: Troponin I: <0.3 ng/mL (ref <0.30)

## 2012-07-24 NOTE — ED Notes (Signed)
The pt is c/o a headache chest pain and body aches with sinus congestion

## 2012-07-25 ENCOUNTER — Emergency Department (HOSPITAL_COMMUNITY): Payer: 59

## 2012-07-25 ENCOUNTER — Emergency Department (HOSPITAL_COMMUNITY)
Admission: EM | Admit: 2012-07-25 | Discharge: 2012-07-25 | Disposition: A | Discharge status: Home / Self Care | Payer: 59 | Attending: Emergency Medicine | Admitting: Emergency Medicine

## 2012-07-25 DIAGNOSIS — R51 Headache: Secondary | ICD-10-CM

## 2012-07-25 DIAGNOSIS — I10 Essential (primary) hypertension: Secondary | ICD-10-CM

## 2012-07-25 IMAGING — CT CT HEAD W/O CM
1 of 2 series · 16 of 30 positions shown, 20 images · non-contrast
Comparison: [DATE]

CLINICAL DATA: Headache and sinus pressure.

CT HEAD WITHOUT CONTRAST
TECHNIQUE: Contiguous axial images were obtained from the base of
the skull through the vertex without contrast.

[Series 2: brain · axial · 0.47mm/px · z∈[-152,-11]mm · 16 of 32 slices shown, 20 images]
[im 2/32  brain]
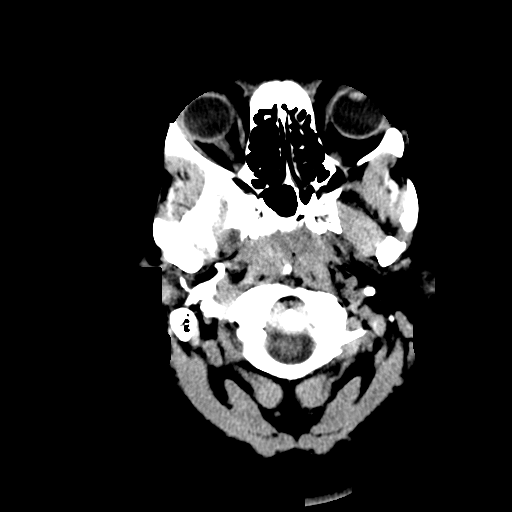
[im 2/32  bone]
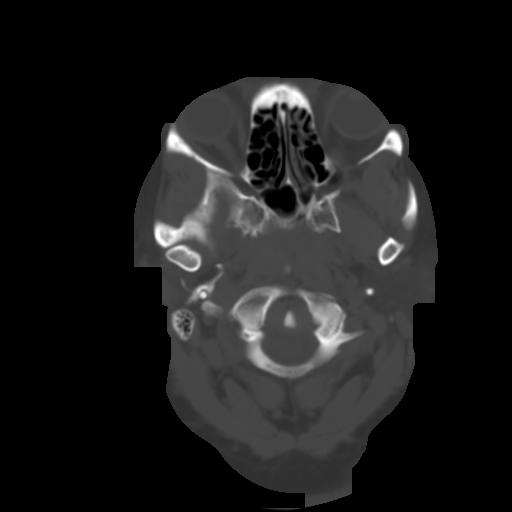
[im 4/32  brain]
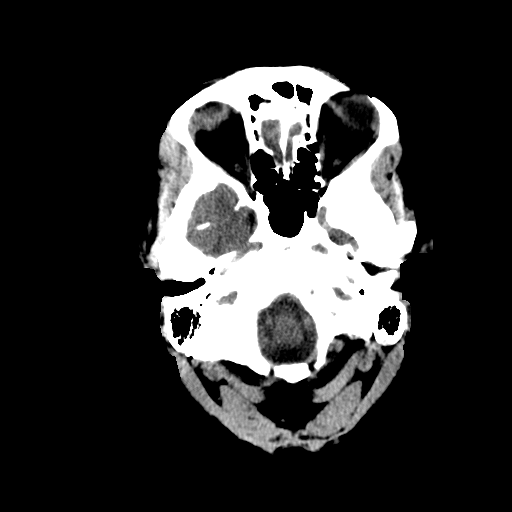
[im 6/32  brain]
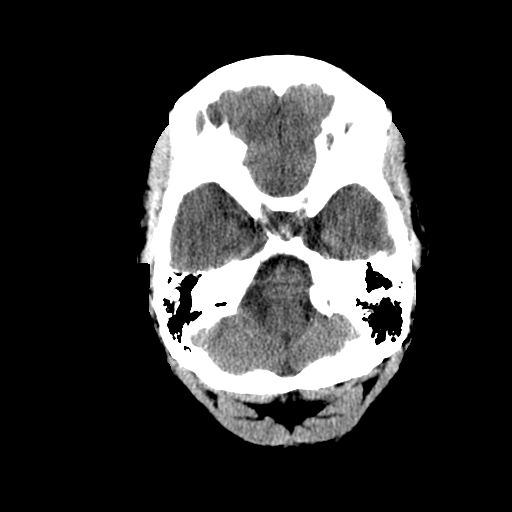
[im 8/32  brain]
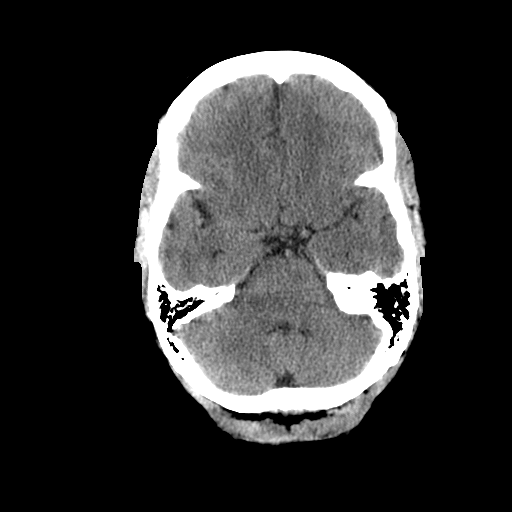
[im 10/32  brain]
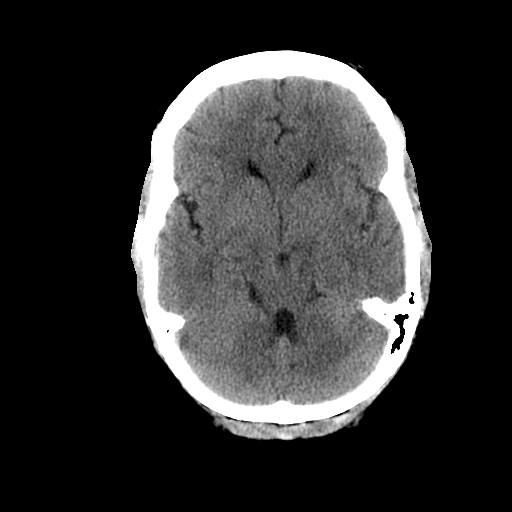
[im 10/32  bone]
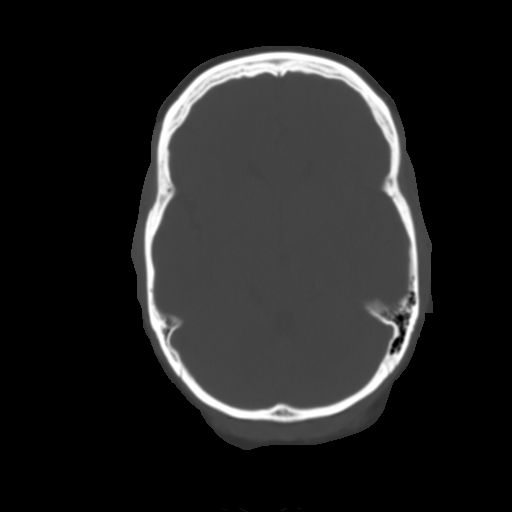
[im 11/32  brain]
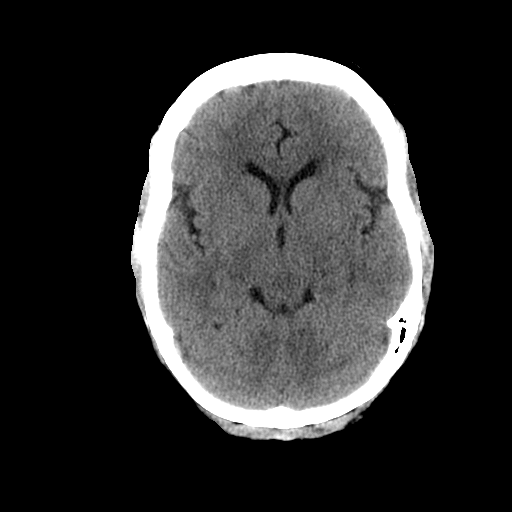
[im 13/32  brain]
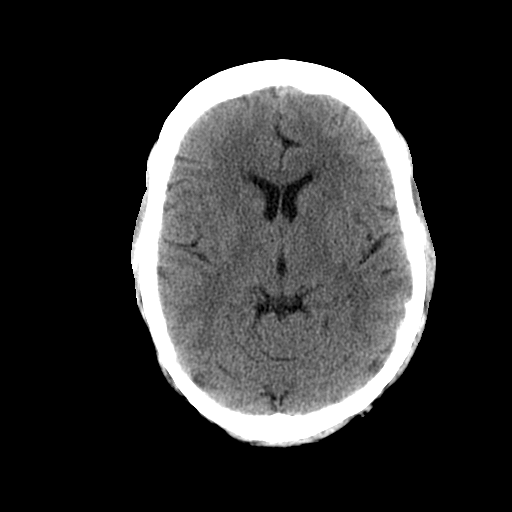
[im 15/32  brain]
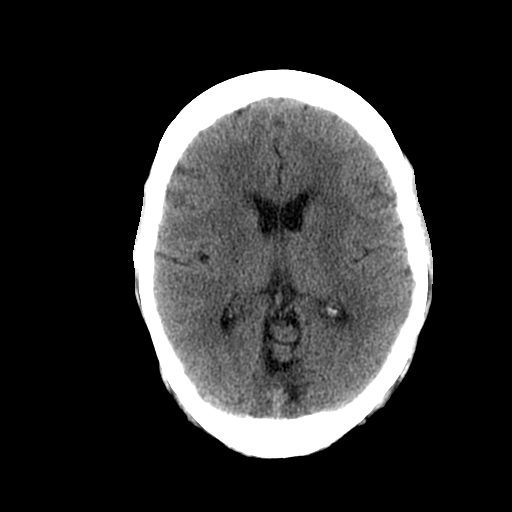
[im 17/32  brain]
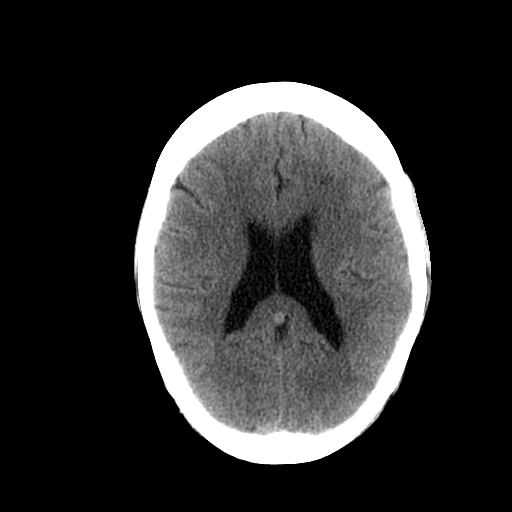
[im 17/32  bone]
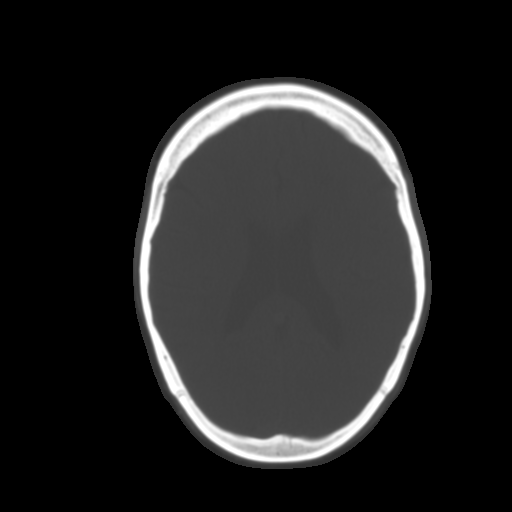
[im 19/32  brain]
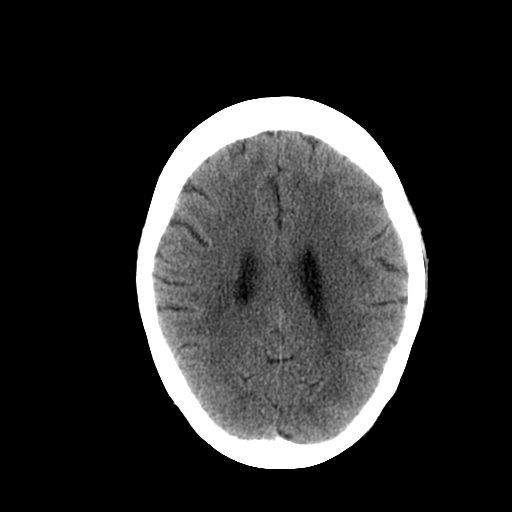
[im 21/32  brain]
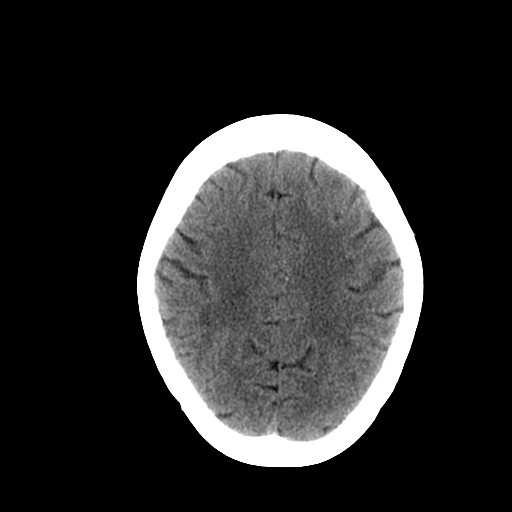
[im 22/32  brain]
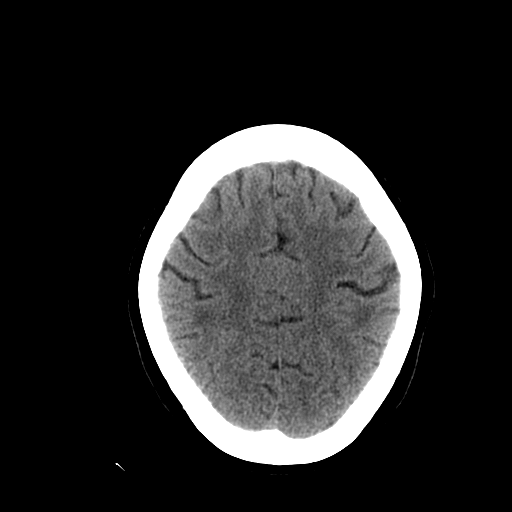
[im 24/32  brain]
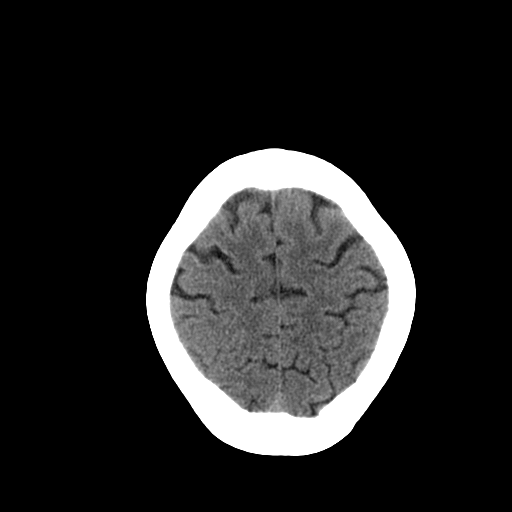
[im 24/32  bone]
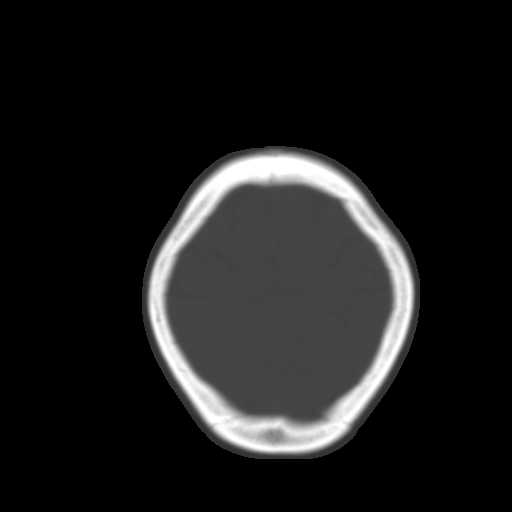
[im 26/32  brain]
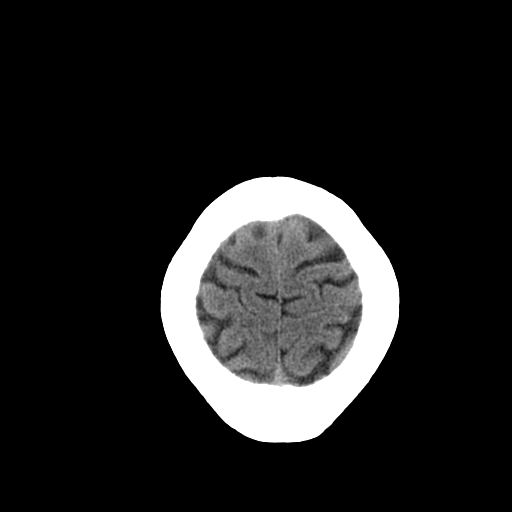
[im 28/32  brain]
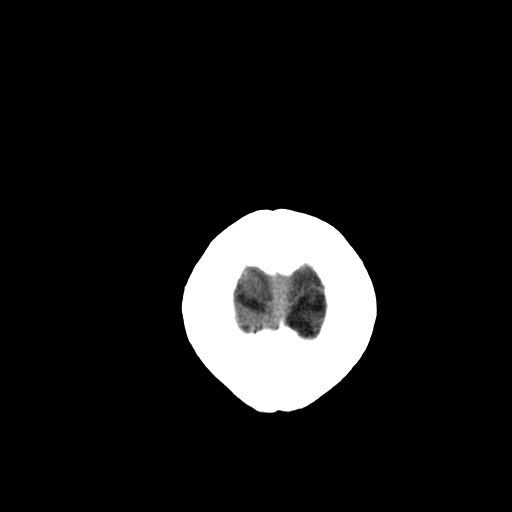
[im 30/32  brain]
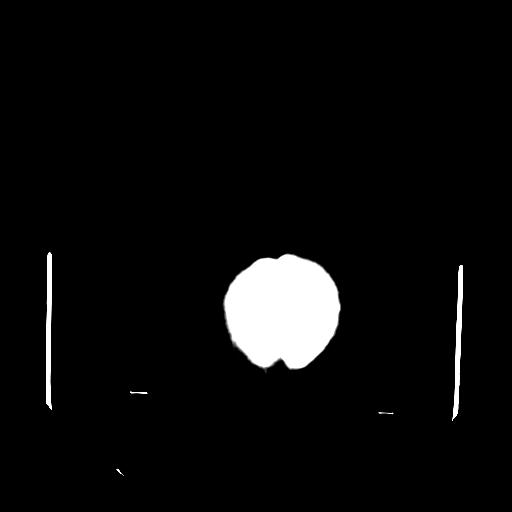

[16 of 30 positions shown; findings below may reference images not displayed]

FINDINGS: There are patchy areas of low density within the
subcortical white matter.  These findings are similar to the
previous examination.  There is no evidence for acute hemorrhage,
mass lesion, midline shift, hydrocephalus or large infarct.  There
is no significant paranasal sinus disease.
IMPRESSION: No acute intracranial abnormalities.

Scattered low density areas in the white matter are nonspecific.
Findings may represent chronic small vessel ischemic changes.

## 2012-07-25 MED ORDER — DIPHENHYDRAMINE HCL 50 MG/ML IJ SOLN
25.0000 mg | Freq: Once | INTRAMUSCULAR | Status: AC
  Administered 2012-07-25: 25 mg via INTRAMUSCULAR
  Filled 2012-07-25: qty 1

## 2012-07-25 MED ORDER — HYDROCODONE-ACETAMINOPHEN 5-325 MG PO TABS
1.0000 | ORAL_TABLET | Freq: Once | ORAL | Status: AC
  Administered 2012-07-25: 1 via ORAL
  Filled 2012-07-25: qty 1

## 2012-07-25 MED ORDER — ONDANSETRON HCL 4 MG PO TABS: 4.0000 mg | ORAL_TABLET | Freq: Four times a day (QID) | ORAL | Status: DC

## 2012-07-25 MED ORDER — METOCLOPRAMIDE HCL 5 MG/ML IJ SOLN
10.0000 mg | Freq: Once | INTRAMUSCULAR | Status: AC
  Administered 2012-07-25: 10 mg via INTRAMUSCULAR
  Filled 2012-07-25: qty 2

## 2012-07-25 MED ORDER — POTASSIUM CHLORIDE CRYS ER 20 MEQ PO TBCR
40.0000 meq | EXTENDED_RELEASE_TABLET | Freq: Once | ORAL | Status: AC
  Administered 2012-07-25: 40 meq via ORAL
  Filled 2012-07-25: qty 2

## 2012-07-25 MED ORDER — HYDROCODONE-ACETAMINOPHEN 5-325 MG PO TABS: 2.0000 | ORAL_TABLET | ORAL | Status: DC | PRN

## 2012-07-25 NOTE — ED Provider Notes (Addendum)
History     CSN: [id number]  Arrival date & time 07/24/12  2020   First MD Initiated Contact with Patient 07/25/12 0006      Chief Complaint  Patient presents with  . 2 complaints     (Consider location/radiation/quality/duration/timing/severity/associated sxs/prior treatment) Patient is a 52 y.o. female presenting with headaches.  Headache  This is a new problem. The current episode started yesterday. The problem occurs constantly. The problem has been gradually worsening. The headache is associated with nothing. The pain is located in the frontal region. The pain is at a severity of 8/10. The pain is moderate. The pain does not radiate. She has tried nothing for the symptoms.    Past Medical History  Diagnosis Date  . Hypertension   . Diabetes mellitus   . Acid reflux     History reviewed. No pertinent past surgical history.  No family history on file.  History  Substance Use Topics  . Smoking status: Never Smoker   . Smokeless tobacco: Not on file  . Alcohol Use: No    OB History    Grav Para Term Preterm Abortions TAB SAB Ect Mult Living                  Review of Systems  Neurological: Positive for headaches.  All other systems reviewed and are negative.    Allergies  Review of patient's allergies indicates no known allergies.  Home Medications   Current Outpatient Rx  Name  Route  Sig  Dispense  Refill  . GLUCOSE BLOOD VI STRP      Use as instructed   100 each   12   . METFORMIN HCL 500 MG PO TABS   Oral   Take 500 mg by mouth 2 (two) times daily with a meal.         . ADULT MULTIVITAMIN W/MINERALS CH   Oral   Take 1 tablet by mouth daily.         Marland Kitchen OLMESARTAN MEDOXOMIL-HCTZ 40-25 MG PO TABS   Oral   Take 1 tablet by mouth daily.           BP 150/92  Pulse 88  Temp 98.2 F (36.8 C) (Oral)  Resp 20  SpO2 97%  LMP 05/24/2012  Physical Exam  Constitutional: She is oriented to person, place, and time. She appears  well-developed and well-nourished.  HENT:  Head: Normocephalic and atraumatic.  Eyes: Conjunctivae normal and EOM are normal. Pupils are equal, round, and reactive to light.  Neck: Normal range of motion.  Cardiovascular: Normal rate, regular rhythm and normal heart sounds.   Pulmonary/Chest: Effort normal and breath sounds normal.  Abdominal: Soft. Bowel sounds are normal.  Musculoskeletal: Normal range of motion.  Neurological: She is alert and oriented to person, place, and time.  Skin: Skin is warm and dry.  Psychiatric: She has a normal mood and affect. Her behavior is normal.    ED Course  Procedures (including critical care time)  Labs Reviewed  COMPREHENSIVE METABOLIC PANEL - Abnormal; Notable for the following:    Potassium 3.1 (*)     Glucose, Bld 139 (*)     All other components within normal limits  URINALYSIS, ROUTINE W REFLEX MICROSCOPIC - Abnormal; Notable for the following:    APPearance CLOUDY (*)     All other components within normal limits  CBC WITH DIFFERENTIAL  TROPONIN I  PREGNANCY, URINE   No results found.   No diagnosis found.  MDM  + frontal ha, not worse of life,  Associated with sinus congestion, nausea.  Will analgesia, ct head, reassess   Ct neg. Improved.  Will dx to [facility] oupt,  Ret new/worsening sxs     Goldman Birchall Lytle Michaels, MD 07/25/12 0013  Rosanne Ashing, MD 07/25/12 1610

## 2012-07-25 NOTE — ED Notes (Addendum)
This RN escorted pt back to room: Pt alert, NAD, calm, interactive, resps e/u, speaking in clear complete sentences, ambulatory with slow steady gait, wait explained, EDP into room upon pt arrival to room.

## 2012-07-25 NOTE — ED Notes (Signed)
Pt returned from xray

## 2012-07-25 NOTE — ED Notes (Signed)
Patient transported to CT 

## 2013-01-08 ENCOUNTER — Emergency Department (HOSPITAL_COMMUNITY)
Admission: EM | Admit: 2013-01-08 | Discharge: 2013-01-08 | Disposition: A | Discharge status: Home / Self Care | Payer: BC Managed Care – PPO | Source: Home / Self Care | Attending: Emergency Medicine | Admitting: Emergency Medicine

## 2013-01-08 ENCOUNTER — Encounter (HOSPITAL_COMMUNITY): Payer: Self-pay | Admitting: *Deleted

## 2013-01-08 DIAGNOSIS — M2550 Pain in unspecified joint: Secondary | ICD-10-CM

## 2013-01-08 DIAGNOSIS — F32A Depression, unspecified: Secondary | ICD-10-CM

## 2013-01-08 DIAGNOSIS — R5383 Other fatigue: Secondary | ICD-10-CM

## 2013-01-08 DIAGNOSIS — E119 Type 2 diabetes mellitus without complications: Secondary | ICD-10-CM

## 2013-01-08 DIAGNOSIS — G4730 Sleep apnea, unspecified: Secondary | ICD-10-CM

## 2013-01-08 DIAGNOSIS — R5381 Other malaise: Secondary | ICD-10-CM

## 2013-01-08 DIAGNOSIS — R9431 Abnormal electrocardiogram [ECG] [EKG]: Secondary | ICD-10-CM

## 2013-01-08 DIAGNOSIS — F329 Major depressive disorder, single episode, unspecified: Secondary | ICD-10-CM

## 2013-01-08 LAB — POCT URINALYSIS DIP (DEVICE)
Bilirubin Urine: NEGATIVE
Ketones, ur: NEGATIVE mg/dL
Protein, ur: 30 mg/dL — AB
Specific Gravity, Urine: 1.025 (ref 1.005–1.030)
pH: 5.5 (ref 5.0–8.0)

## 2013-01-08 LAB — POCT I-STAT, CHEM 8
BUN: 18 mg/dL (ref 6–23)
Calcium, Ion: 1.1 mmol/L — ABNORMAL LOW (ref 1.12–1.23)
Chloride: 102 mEq/L (ref 96–112)
Creatinine, Ser: 0.8 mg/dL (ref 0.50–1.10)
TCO2: 25 mmol/L (ref 0–100)

## 2013-01-08 MED ORDER — MELOXICAM 15 MG PO TABS: 15.0000 mg | ORAL_TABLET | Freq: Every day | ORAL | Status: DC

## 2013-01-08 NOTE — ED Notes (Signed)
Pt  Reports  Symptoms  Of weakness          r  Sided  Jaw    Facial pain     For  sev  Weeks      Pt  Reports  Glucose  Was  Elevated  This  Am   - She  states  She is  Taking  Her  meds    She  Ambulated  To  Room with a  Slow  Steady  Gait         Pt     Alert  However  Somewhat sheepish

## 2013-01-08 NOTE — ED Provider Notes (Signed)
Chief Complaint:   Chief Complaint  Patient presents with  . Weakness    History of Present Illness:   Shannon Oliver is a 52 year old female with hypertension, diabetes, and acid reflux who presents with a 2 month history of fatigue, generalized weakness, tiredness, sleepiness, and lack of energy. She states she fatigues easily on the job. 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 The patient states that she does not sleep well at night. Sometimes joint pain keeps her awake. She's had episodes of apnea at nighttime and awakening choking or gasping. She wakes in the morning with fatigue and notes unrefreshing sleep. She's also been under a lot of stress recently. Her mother just died leaving 4 children. She is the primary support for brother who doesn't have a job and also for her husband was out of work. In order to make ends meet she has to work long hours and several jobs. She also notes aching in her joints including her hips, lower back, and shoulders. Her blood sugars have not been well controlled. They have been running around 450, and she's been trying to get her diabetes under control. She was on metformin 500 mg twice a day and her primary care physician ordered glimepiride 2 mg, but did cause some hypoglycemia so she cut down to 1 mg. Recently she's not been taking it at all. She also reports blurry vision, double vision, and palpitations. She has had some right jaw pain. The patient states she sleeps poorly at night because of joint pain. She's had some swollen glands in her neck and dizzy spells. Her chest hurts when her blood sugar gets high. She's had some urinary frequency, urgency, abnormal menses. Her feet feel numb and her fingers tingle. She's felt depressed and stressed. She denies any suicidal or homicidal ideation. She's had no anxiety or panic attacks. She does not use alcohol or recreational drugs. No hallucinations or delusions. She denies any  fever, chills, weight loss, shortness of breath, coughing, wheezing, syncope, abdominal pain, nausea, vomiting, diarrhea, constipation, blood in the stool, or urinary symptoms.   Review of Systems:  Other than noted above, the patient denies any of the following symptoms. Systemic:  No fever, chills, sweats, myalgias, headache, or anorexia. Eye:  No redness, pain or drainage. ENT:  No earache, nasal congestion, rhinorrhea, sinus pressure, or sore throat. Lungs:  No cough, sputum production, wheezing, shortness of breath. No loud snoring, choking or gasping at night, or unrefreshing sleep. Cardiovascular:  No chest pain, palpitations, or syncope. GI:  No nausea, vomiting, abdominal pain or diarrhea. GU:  No dysuria, frequency, or hematuria. Skin:  No rash or pruritis. Psych:  No history of depression or anxiety.  PMFSH:  Past medical history, family history, social history, meds, and allergies were reviewed. She has high blood pressure and diabetes and takes metformin, glimepiride, Benicar/HCTZ and amlodipine.  Physical Exam:   Vital signs:  BP 174/93  Pulse 91  Temp(Src) 97.3 F (36.3 C) (Oral)  Resp 16  SpO2 97% General:  Alert, in no distress. Eye:  PERRL, full EOMs.  Lids and conjunctivas were normal. ENT:  TMs and canals were normal, without erythema or inflammation.  Nasal mucosa was clear and uncongested, without drainage.  Mucous membranes were moist.  Pharynx was clear, without exudate or drainage.  There were no oral ulcerations or lesions. Neck:  Supple, no adenopathy, tenderness or mass. Thyroid was normal. Lungs:  No respiratory distress.  Lungs were clear to auscultation, without wheezes, rales or rhonchi.  Breath sounds were clear and equal bilaterally. Heart:  Regular rhythm, without gallops, murmers or rubs. Abdomen:  Soft, flat, and non-tender to palpation.  No hepatosplenomagaly or mass. Skin:  Clear, warm, and dry, without rash or lesions. Psych:  Normal mood and  affect.  Labs:   Results for orders placed during the hospital encounter of 01/08/13  GLUCOSE, CAPILLARY      Result Value Range   Glucose-Capillary 197 (*) 70 - 99 mg/dL   Comment 1 Notify RN    POCT URINALYSIS DIP (DEVICE)      Result Value Range   Glucose, UA NEGATIVE  NEGATIVE mg/dL   Bilirubin Urine NEGATIVE  NEGATIVE   Ketones, ur NEGATIVE  NEGATIVE mg/dL   Specific Gravity, Urine 1.025  1.005 - 1.030   Hgb urine dipstick NEGATIVE  NEGATIVE   pH 5.5  5.0 - 8.0   Protein, ur 30 (*) NEGATIVE mg/dL   Urobilinogen, UA 0.2  0.0 - 1.0 mg/dL   Nitrite NEGATIVE  NEGATIVE   Leukocytes, UA NEGATIVE  NEGATIVE  POCT I-STAT, CHEM 8      Result Value Range   Sodium 139  135 - 145 mEq/L   Potassium 3.4 (*) 3.5 - 5.1 mEq/L   Chloride 102  96 - 112 mEq/L   BUN 18  6 - 23 mg/dL   Creatinine, Ser 1.61  0.50 - 1.10 mg/dL   Glucose, Bld 096 (*) 70 - 99 mg/dL   Calcium, Ion 0.45 (*) 1.12 - 1.23 mmol/L   TCO2 25  0 - 100 mmol/L   Hemoglobin 14.6  12.0 - 15.0 g/dL   HCT 40.9  81.1 - 91.4 %    EKG Results:  Date: 01/08/2013  Rate: 82  Rhythm: normal sinus rhythm  QRS Axis: normal  Intervals: normal  ST/T Wave abnormalities: normal  Conduction Disutrbances:none  Narrative Interpretation: Normal sinus rhythm, septal infarct, age undetermined. She has QS waves in leads V1 and V2. Also she has biphasic T.'s in leads 2, 3, aVF, and V6. This is all unchanged from previous EKGs going back to April 2013.  Old EKG Reviewed: unchanged  Assessment:  The primary encounter diagnosis was Fatigue. Diagnoses of Sleep apnea, Depression, Type 2 diabetes mellitus, Abnormal EKG, and Arthralgia were also pertinent to this visit.  I think her fatigue is multifactorial. Probably do in most part to sleep apnea, also in part due to depression and 2 poorly controlled diabetes. I suggested that she see a sleep specialist, a cardiologist to followup on the changes on her EKG, and followup with her primary care  physician to try to get her blood sugars under control. I suggested she start back on the glimepiride 1 mg per day. She was given the meloxicam for the joint pain.  Plan:   1.  The following meds were prescribed:   Discharge Medication List as of 01/08/2013  2:41 PM    START taking these medications   Details  meloxicam (MOBIC) 15 MG tablet Take 1 tablet (15 mg total) by mouth daily., Starting 01/08/2013, Until Discontinued, Normal       2.  The patient was instructed in symptomatic care and handouts were given. 3.  The patient was told to return if becoming worse in any way, if no better in 3 or 4 days, and given some red flag symptoms such as any acute chest pain or shortness of breath that would indicate earlier  return. 4.  Follow up with Dr. Marcelyn Bruins for sleep apnea, Dr. Tonny Bollman for her abnormal EKG, and her primary care physician for her diabetes.   Reuben Likes, MD 01/08/13 [phone]

## 2013-01-23 ENCOUNTER — Encounter: Payer: Self-pay | Admitting: Cardiovascular Disease

## 2013-01-23 ENCOUNTER — Encounter: Payer: Self-pay | Admitting: *Deleted

## 2013-01-23 DIAGNOSIS — I10 Essential (primary) hypertension: Secondary | ICD-10-CM | POA: Insufficient documentation

## 2013-01-23 DIAGNOSIS — K219 Gastro-esophageal reflux disease without esophagitis: Secondary | ICD-10-CM | POA: Insufficient documentation

## 2013-01-25 ENCOUNTER — Encounter: Payer: Self-pay | Admitting: Cardiovascular Disease

## 2013-01-25 ENCOUNTER — Ambulatory Visit (INDEPENDENT_AMBULATORY_CARE_PROVIDER_SITE_OTHER): Payer: BC Managed Care – PPO | Admitting: Cardiovascular Disease

## 2013-01-25 VITALS — BP 134/96 | HR 92 | Resp 18 | Ht 66.0 in | Wt 255.0 lb

## 2013-01-25 DIAGNOSIS — I10 Essential (primary) hypertension: Secondary | ICD-10-CM

## 2013-01-25 DIAGNOSIS — R0600 Dyspnea, unspecified: Secondary | ICD-10-CM | POA: Insufficient documentation

## 2013-01-25 DIAGNOSIS — E119 Type 2 diabetes mellitus without complications: Secondary | ICD-10-CM

## 2013-01-25 DIAGNOSIS — R9431 Abnormal electrocardiogram [ECG] [EKG]: Secondary | ICD-10-CM

## 2013-01-25 DIAGNOSIS — R0989 Other specified symptoms and signs involving the circulatory and respiratory systems: Secondary | ICD-10-CM

## 2013-01-25 DIAGNOSIS — R0602 Shortness of breath: Secondary | ICD-10-CM

## 2013-01-25 DIAGNOSIS — R0609 Other forms of dyspnea: Secondary | ICD-10-CM

## 2013-01-25 DIAGNOSIS — R609 Edema, unspecified: Secondary | ICD-10-CM | POA: Insufficient documentation

## 2013-01-25 NOTE — Assessment & Plan Note (Signed)
Multiple poorly controlled RF;s with dyspnea as possible anginal equivalent.  F/U ETT

## 2013-01-25 NOTE — Assessment & Plan Note (Signed)
Insig Q in 3 and lead placement with body habitus V12  I think her ECG is relatively normal F/U echo to assess LVH and EF

## 2013-01-25 NOTE — Assessment & Plan Note (Signed)
Well controlled.  Continue current medications and low sodium Dash type diet.    

## 2013-01-25 NOTE — Assessment & Plan Note (Signed)
Low sodium diet Dependant from obesity.  Continue low dose diuretic

## 2013-01-25 NOTE — Patient Instructions (Addendum)
Your physician recommends that you schedule a follow-up appointment as needed with Dr Eden Emms   Your physician has requested that you have an echocardiogram. Echocardiography is a painless test that uses sound waves to create images of your heart. It provides your doctor with information about the size and shape of your heart and how well your heart's chambers and valves are working. This procedure takes approximately one hour. There are no restrictions for this procedure.  Your physician has requested that you have an exercise tolerance test. For further information please visit [email or web address] Please also follow instruction sheet, as given.

## 2013-01-25 NOTE — Progress Notes (Signed)
Patient ID: Shannon Oliver, female   DOB: [birth year], 52 y.o.   MRN: [id number] 52 yo obese female with exertional dyspnea, edema, blurry vision and multiple CRF;s including DM, HTN and elevated lipids Referred by primary for abnormal ECG.  Reviewed ECG from 01/08/13 SR rate 82 Q in lead 3 and V12  No change from ECG 07/25/12  In fact she had more LVH with strain and worse ECG in 2013.  She has poor insight into low carb diet. Her A1c is in 9 range. She lost her mother 2 years ago and helps care for 4 adopted kids.  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  BP meds also for 2 years but not always compliant. Blurry vision last 6 months and has appt with eye doctor this month.  Exertional dyspnea since weight gone No PND, orthopnea.  Chronic mild dependant LE edema. No history of DVT.  No previous ETT.    ROS: Denies fever, malais, weight loss, blurry vision, decreased visual acuity, cough, sputum, SOB, hemoptysis, pleuritic pain, palpitaitons, heartburn, abdominal pain, melena, lower extremity edema, claudication, or rash.  All other systems reviewed and negative   General: Affect appropriate Obese black female  HEENT: normal Neck supple with no adenopathy JVP normal no bruits no thyromegaly Lungs clear with no wheezing and good diaphragmatic motion Heart:  S1/S2 no murmur,rub, gallop or click PMI normal Abdomen: benighn, BS positve, no tenderness, no AAA no bruit.  No HSM or HJR Distal pulses intact with no bruits No edema Neuro non-focal Skin warm and dry No muscular weakness  Medications Current Outpatient Prescriptions  Medication Sig Dispense Refill  . amLODipine (NORVASC) 5 MG tablet Take 5 mg by mouth daily.      Marland Kitchen glimepiride (AMARYL) 2 MG tablet Take 2 mg by mouth daily before breakfast.      . HYDROcodone-acetaminophen (NORCO/VICODIN) 5-325 MG per tablet Take 2 tablets by mouth every 4 (four) hours as needed for pain.  6 tablet  0  . meloxicam (MOBIC)  15 MG tablet Take 15 mg by mouth as needed.      . metFORMIN (GLUCOPHAGE) 500 MG tablet Take 500 mg by mouth 2 (two) times daily with a meal.      . olmesartan-hydrochlorothiazide (BENICAR HCT) 40-25 MG per tablet Take 1 tablet by mouth daily.      . ondansetron (ZOFRAN) 4 MG tablet Take 1 tablet (4 mg total) by mouth every 6 (six) hours.  12 tablet  0   No current facility-administered medications for this visit.    Allergies Review of patient's allergies indicates no known allergies.  Family History: No family history on file.  Social History: History   Social History  . Marital Status: Married    Spouse Name: N/A    Number of Children: N/A  . Years of Education: N/A   Occupational History  . Not on file.   Social History Main Topics  . Smoking status: Never Smoker   . Smokeless tobacco: Not on file  . Alcohol Use: No  . Drug Use:   . Sexually Active:    Other Topics Concern  . Not on file   Social History Narrative  . No narrative on file    Electrocardiogram:  See HPI for comparison of 3 ECGs  Assessment and Plan

## 2013-01-31 ENCOUNTER — Ambulatory Visit (HOSPITAL_COMMUNITY): Payer: BC Managed Care – PPO | Attending: Cardiovascular Disease

## 2013-01-31 DIAGNOSIS — R609 Edema, unspecified: Secondary | ICD-10-CM | POA: Insufficient documentation

## 2013-01-31 DIAGNOSIS — R0609 Other forms of dyspnea: Secondary | ICD-10-CM | POA: Insufficient documentation

## 2013-01-31 DIAGNOSIS — E119 Type 2 diabetes mellitus without complications: Secondary | ICD-10-CM

## 2013-01-31 DIAGNOSIS — R9431 Abnormal electrocardiogram [ECG] [EKG]: Secondary | ICD-10-CM | POA: Insufficient documentation

## 2013-01-31 DIAGNOSIS — I10 Essential (primary) hypertension: Secondary | ICD-10-CM | POA: Insufficient documentation

## 2013-01-31 DIAGNOSIS — R0602 Shortness of breath: Secondary | ICD-10-CM

## 2013-01-31 DIAGNOSIS — R0989 Other specified symptoms and signs involving the circulatory and respiratory systems: Secondary | ICD-10-CM | POA: Insufficient documentation

## 2013-01-31 NOTE — Progress Notes (Signed)
Echocardiogram performed.  

## 2013-02-12 ENCOUNTER — Ambulatory Visit (INDEPENDENT_AMBULATORY_CARE_PROVIDER_SITE_OTHER): Payer: BC Managed Care – PPO | Admitting: Nurse Practitioner

## 2013-02-12 ENCOUNTER — Encounter: Payer: Self-pay | Admitting: Nurse Practitioner

## 2013-02-12 DIAGNOSIS — R9431 Abnormal electrocardiogram [ECG] [EKG]: Secondary | ICD-10-CM

## 2013-02-12 DIAGNOSIS — E119 Type 2 diabetes mellitus without complications: Secondary | ICD-10-CM

## 2013-02-12 DIAGNOSIS — R0602 Shortness of breath: Secondary | ICD-10-CM

## 2013-02-12 NOTE — Progress Notes (Signed)
Exercise Treadmill Test  Pre-Exercise Testing Evaluation Rhythm: sinus tachycardia  Rate: 102     Test  Exercise Tolerance Test Ordering MD: Charlton Haws, MD  Interpreting MD: Norma Fredrickson, NP  Unique Test No: 1  Treadmill:  1  Indication for ETT: SOB, Abnormal EKG, Diabetes  Contraindication to ETT: No   Stress Modality: exercise - treadmill  Cardiac Imaging Performed: non   Protocol: standard Bruce - maximal  Max BP:  145/149  Max MPHR (bpm):  169 85% MPR (bpm):  144  MPHR obtained (bpm):  148 % MPHR obtained:  88%  Reached 85% MPHR (min:sec):  1 minute Total Exercise Time (min-sec):  1:06  Workload in METS:  3 Borg Scale:   Reason ETT Terminated:  patient's desire to stop    ST Segment Analysis At Rest: significant ST depression making ST analysis non-diagnostic With Exercise: only walked for 1 minute.   Other Information Arrhythmia:  No Angina during ETT:  absent (0) Quality of ETT:  non-diagnostic  ETT Interpretation:  Non-diagnostic  Comments: Patient was referred for GXT in light of abnormal EKG and multiple CV risk factors. Has had shortness of breath and reports chest pain. Has had echo showing normal EF and no wall motion abnormalities. She does not exercise regularly and is limited by pain in her feet.   Today, she exercised on the standard Bruce protocol for 1:06. She has quite poor exercise tolerance. Target heart rate reached at 1 minutes and was limited by pain in her feet. No chest pain. EKG with diffuse resting changes - inferior ST depression, poor R wave progression and was tachycardic prior to starting to exercise.   Recommendations: Test aborted. Will arrange for [facility].  Patient is agreeable to this plan and will call if any problems develop in the interim.   Rosalio Macadamia, RN, ANP-C [facility] [address] [place], [place]  [address]

## 2013-02-13 ENCOUNTER — Emergency Department (INDEPENDENT_AMBULATORY_CARE_PROVIDER_SITE_OTHER)
Admission: EM | Admit: 2013-02-13 | Discharge: 2013-02-13 | Disposition: A | Discharge status: Home / Self Care | Payer: BC Managed Care – PPO | Source: Home / Self Care | Attending: Emergency Medicine | Admitting: Emergency Medicine

## 2013-02-13 ENCOUNTER — Encounter (HOSPITAL_COMMUNITY): Payer: Self-pay | Admitting: Emergency Medicine

## 2013-02-13 DIAGNOSIS — K122 Cellulitis and abscess of mouth: Secondary | ICD-10-CM

## 2013-02-13 MED ORDER — AMOXICILLIN 500 MG PO CAPS: 500.0000 mg | ORAL_CAPSULE | Freq: Three times a day (TID) | ORAL | Status: AC

## 2013-02-13 MED ORDER — PREDNISONE 20 MG PO TABS: 40.0000 mg | ORAL_TABLET | Freq: Every day | ORAL | Status: AC

## 2013-02-13 NOTE — ED Provider Notes (Addendum)
CSN: [id number]     Arrival date & time 02/13/13  0804 History     First MD Initiated Contact with Patient 02/13/13 [phone]     Chief Complaint  Patient presents with  . Sore Throat   (Consider location/radiation/quality/duration/timing/severity/associated sxs/prior Treatment) HPI Comments: Patient presents urgent care complaining of a sore throat and postnasal dripping with sinus congestion for last 2-3 days. She feels that since yesterday she's having more difficulty swallowing. Hasn't pain and discomfort and constant dripping behind her throat. She feels that her lymph nodes were swollen in her neck yesterday they have improved today. She has also had a productive cough since yesterday. Denies any shortness of breath or fevers or shortness of breath.  She also described having some soreness in the back of her neck.( Patient points to right trapezium region.), And exacerbates with activity movement. When she lies flat.   Denies any nausea vomiting headache or fevers.  Patient is a 52 y.o. female presenting with pharyngitis. The history is provided by the patient.  Sore Throat This is a new problem. The current episode started 2 days ago. The problem occurs constantly. The problem has been gradually worsening. Pertinent negatives include no abdominal pain, no headaches and no shortness of breath. The symptoms are aggravated by swallowing. The treatment provided no relief.    Past Medical History  Diagnosis Date  . Hypertension   . Diabetes mellitus     A1C over 9  . Acid reflux   . Obesity   . HLD (hyperlipidemia)   . Abnormal EKG     LVH with strain  . Noncompliance    Past Surgical History  Procedure Laterality Date  . Knee arthroscopy w/ meniscal repair Right    No family history on file. History  Substance Use Topics  . Smoking status: Never Smoker   . Smokeless tobacco: Not on file  . Alcohol Use: No   OB History   Grav Para Term Preterm Abortions TAB SAB Ect Mult  Living                 Review of Systems  Constitutional: Negative for fever, activity change and fatigue.  HENT: Positive for congestion, sore throat, rhinorrhea, trouble swallowing, postnasal drip and sinus pressure. Negative for hearing loss, ear pain, nosebleeds, sneezing, neck pain and neck stiffness.   Respiratory: Negative for shortness of breath.   Gastrointestinal: Negative for nausea, vomiting and abdominal pain.  Skin: Negative for color change, rash and wound.  Neurological: Negative for dizziness and headaches.    Allergies  Review of patient's allergies indicates no known allergies.  Home Medications   Current Outpatient Rx  Name  Route  Sig  Dispense  Refill  . amLODipine (NORVASC) 5 MG tablet   Oral   Take 5 mg by mouth daily.         Marland Kitchen amoxicillin (AMOXIL) 500 MG capsule   Oral   Take 1 capsule (500 mg total) by mouth 3 (three) times daily.   30 capsule   0   . glimepiride (AMARYL) 2 MG tablet   Oral   Take 2 mg by mouth daily before breakfast.         . HYDROcodone-acetaminophen (NORCO/VICODIN) 5-325 MG per tablet   Oral   Take 2 tablets by mouth every 4 (four) hours as needed for pain.   6 tablet   0   . meloxicam (MOBIC) 15 MG tablet   Oral   Take 15 mg by  mouth as needed.         . metFORMIN (GLUCOPHAGE) 500 MG tablet   Oral   Take 500 mg by mouth 2 (two) times daily with a meal.         . olmesartan-hydrochlorothiazide (BENICAR HCT) 40-25 MG per tablet   Oral   Take 1 tablet by mouth daily.         . ondansetron (ZOFRAN) 4 MG tablet   Oral   Take 1 tablet (4 mg total) by mouth every 6 (six) hours.   12 tablet   0   . predniSONE (DELTASONE) 20 MG tablet   Oral   Take 2 tablets (40 mg total) by mouth daily. 2 tablets daily for 5 days   10 tablet   0    BP 125/85  Pulse 98  Temp(Src) 98.5 F (36.9 C) (Oral)  Resp 22  SpO2 98%  LMP 05/24/2012 Physical Exam  Nursing note and vitals reviewed. Constitutional: She  appears well-developed and well-nourished.  HENT:  Head: Normocephalic.  Mouth/Throat: Uvula is midline and mucous membranes are normal. Posterior oropharyngeal erythema present. No oropharyngeal exudate or tonsillar abscesses.    Eyes: Conjunctivae are normal. No scleral icterus.  Neck: Normal range of motion. Neck supple. No JVD present. No tracheal deviation present. No Brudzinski's sign and no Kernig's sign noted. No thyromegaly present.  Pulmonary/Chest: Effort normal and breath sounds normal.  Musculoskeletal:       Back:  Lymphadenopathy:    She has no cervical adenopathy.  Neurological: She is alert.  Skin: No erythema.    ED Course   Procedures (including critical care time)  Labs Reviewed  POCT RAPID STREP A ([facility] ONLY)   No results found. 1. Uvulitis     MDM  Uvulitis-and postnasal dripping.  Discharge Medication List as of 02/13/2013  8:57 AM    START taking these medications   Details  amoxicillin (AMOXIL) 500 MG capsule Take 1 capsule (500 mg total) by mouth 3 (three) times daily., Starting 02/13/2013, Last dose on Fri 02/23/13, Normal    predniSONE (DELTASONE) 20 MG tablet Take 2 tablets (40 mg total) by mouth daily. 2 tablets daily for 5 days, Starting 02/13/2013, Last dose on Mon 02/19/13, Print          Jimmie Molly, MD 02/13/13 2956 and  Jimmie Molly, MD 02/13/13 1042

## 2013-02-13 NOTE — ED Notes (Signed)
Patient complains of sore throat, feels like "strep throat".  Reports symptoms for 6 days.  Patient has tried tylenol, salt water gargles.

## 2013-02-15 ENCOUNTER — Telehealth (HOSPITAL_COMMUNITY): Payer: Self-pay | Admitting: Radiology

## 2013-02-15 ENCOUNTER — Ambulatory Visit (HOSPITAL_COMMUNITY): Payer: BC Managed Care – PPO | Attending: Cardiovascular Disease | Admitting: Radiology

## 2013-02-15 VITALS — BP 129/89 | HR 84 | Ht 66.0 in | Wt 253.0 lb

## 2013-02-15 DIAGNOSIS — R0989 Other specified symptoms and signs involving the circulatory and respiratory systems: Secondary | ICD-10-CM | POA: Insufficient documentation

## 2013-02-15 DIAGNOSIS — E669 Obesity, unspecified: Secondary | ICD-10-CM | POA: Insufficient documentation

## 2013-02-15 DIAGNOSIS — I10 Essential (primary) hypertension: Secondary | ICD-10-CM | POA: Insufficient documentation

## 2013-02-15 DIAGNOSIS — R9431 Abnormal electrocardiogram [ECG] [EKG]: Secondary | ICD-10-CM

## 2013-02-15 DIAGNOSIS — R0609 Other forms of dyspnea: Secondary | ICD-10-CM | POA: Insufficient documentation

## 2013-02-15 DIAGNOSIS — E119 Type 2 diabetes mellitus without complications: Secondary | ICD-10-CM

## 2013-02-15 DIAGNOSIS — E785 Hyperlipidemia, unspecified: Secondary | ICD-10-CM | POA: Insufficient documentation

## 2013-02-15 DIAGNOSIS — R002 Palpitations: Secondary | ICD-10-CM | POA: Insufficient documentation

## 2013-02-15 DIAGNOSIS — R5381 Other malaise: Secondary | ICD-10-CM | POA: Insufficient documentation

## 2013-02-15 DIAGNOSIS — R0602 Shortness of breath: Secondary | ICD-10-CM

## 2013-02-15 DIAGNOSIS — R000 Tachycardia, unspecified: Secondary | ICD-10-CM | POA: Insufficient documentation

## 2013-02-15 DIAGNOSIS — R079 Chest pain, unspecified: Secondary | ICD-10-CM | POA: Insufficient documentation

## 2013-02-15 LAB — CULTURE, GROUP A STREP

## 2013-02-15 MED ORDER — TECHNETIUM TC 99M SESTAMIBI GENERIC - CARDIOLITE
30.0000 | Freq: Once | INTRAVENOUS | Status: AC | PRN
  Administered 2013-02-15: 30 via INTRAVENOUS

## 2013-02-15 MED ORDER — REGADENOSON 0.4 MG/5ML IV SOLN
0.4000 mg | Freq: Once | INTRAVENOUS | Status: AC
  Administered 2013-02-15: 0.4 mg via INTRAVENOUS

## 2013-02-15 NOTE — Progress Notes (Signed)
[facility] SITE 3 NUCLEAR MED [address] [place], [place] [address] [phone]    Cardiology Nuclear Med Study  Shannon Oliver is a 52 y.o. female     MRN : [id number]     DOB: 11-01-1960  Procedure Date: 02/15/2013  Nuclear Med Background Indication for Stress Test:  Evaluation for Ischemia History:  7/14 Echo:EF=65%; 02/12/13 OZH:YQMV exercise tolerance, aborted Cardiac Risk Factors: Hypertension, Lipids, NIDDM and Obesity  Symptoms:  Chest Pain with and without Exertion (last episode of chest discomfort was about 2-weeks ago), DOE, Fatigue, Palpitations and Rapid HR   Nuclear Pre-Procedure Caffeine/Decaff Intake:  None NPO After: 8:00pm   Lungs:  Clear. O2 Sat: 96% on room air. IV 0.9% NS with Angio Cath:  22g  IV Site: R Antecubital  IV Started by:  Bonnita Levan, RN  Chest Size (in):  42 Cup Size: B  Height: 5\' 6"  (1.676 m)  Weight:  253 lb (114.76 kg)  BMI:  Body mass index is 40.85 kg/(m^2). Tech Comments:  N/A    Nuclear Med Study 1 or 2 day study: 2 day  Stress Test Type:  Lexiscan  Reading MD: Charlton Haws, MD  Order Authorizing Provider:  Charlton Haws, MD  Resting Radionuclide: Technetium 28m Sestamibi  Resting Radionuclide Dose: 33.0 mCi on 02/21/13   Stress Radionuclide:  Technetium 28m Sestamibi  Stress Radionuclide Dose: 33.0 mCi on 02/15/13           Stress Protocol Rest HR: 84 Stress HR: 131  Rest BP: 129/89 Stress BP: 160/110  Exercise Time (min): 2:00 METS: n/a   Predicted Max HR: 169 bpm % Max HR: 77.51 bpm Rate Pressure Product: 78469   Dose of Adenosine (mg):  n/a Dose of Lexiscan: 0.4 mg  Dose of Atropine (mg): n/a Dose of Dobutamine: n/a mcg/kg/min (at max HR)  Stress Test Technologist: Smiley Houseman, CMA-N  Nuclear Technologist:  Doyne Keel, CNMT     Rest Procedure:  Myocardial perfusion imaging was performed at rest 45 minutes following the intravenous administration of Technetium 1m Sestamibi.  Rest ECG: NSR with  non-specific ST-T wave changes  Stress Procedure:  The patient received IV Lexiscan 0.4 mg over 15-seconds with concurrent low level exercise and then Technetium 17m Sestamibi was injected at 30-seconds while the patient continued walking one more minute.  She c/o chest tightness, nausea and stomach cramps with Lexiscan.  Quantitative spect images were obtained after a 45-minute delay.  Stress ECG: No significant change from baseline ECG  QPS Raw Data Images:  Normal; no motion artifact; normal heart/lung ratio. Stress Images:  Normal homogeneous uptake in all areas of the myocardium. Rest Images:  Normal homogeneous uptake in all areas of the myocardium. Subtraction (SDS):  Normal Transient Ischemic Dilatation (Normal <1.22):  n/a Lung/Heart Ratio (Normal <0.45):  0.27  Quantitative Gated Spect Images QGS EDV:  81 ml QGS ESV:  28 ml  Impression Exercise Capacity:  Lexiscan with low level exercise. BP Response:  Normal blood pressure response. Clinical Symptoms:  No significant symptoms noted. ECG Impression:  No significant ST segment change suggestive of ischemia. Comparison with Prior Nuclear Study: No previous nuclear study performed  Overall Impression:  Normal stress nuclear study.  LV Ejection Fraction: 66%.  LV Wall Motion:  NL LV Function; NL Wall Motion  Charlton Haws

## 2013-02-21 ENCOUNTER — Ambulatory Visit (HOSPITAL_COMMUNITY): Payer: BC Managed Care – PPO | Attending: Cardiovascular Disease

## 2013-02-21 ENCOUNTER — Encounter (HOSPITAL_COMMUNITY): Payer: BC Managed Care – PPO

## 2013-02-21 DIAGNOSIS — R0989 Other specified symptoms and signs involving the circulatory and respiratory systems: Secondary | ICD-10-CM

## 2013-02-21 MED ORDER — TECHNETIUM TC 99M SESTAMIBI GENERIC - CARDIOLITE
33.0000 | Freq: Once | INTRAVENOUS | Status: AC | PRN
  Administered 2013-02-21: 33 via INTRAVENOUS

## 2013-02-23 ENCOUNTER — Telehealth: Payer: Self-pay | Admitting: Cardiovascular Disease

## 2013-02-23 NOTE — Telephone Encounter (Signed)
lmtcb ./cy 

## 2013-02-23 NOTE — Telephone Encounter (Signed)
F/up   Pt returning call// pt not sure of the nature of call.

## 2013-02-23 NOTE — Telephone Encounter (Signed)
PT AWARE OF   STRESS  MYOVIEW RESULTS./CY

## 2013-04-03 ENCOUNTER — Ambulatory Visit (INDEPENDENT_AMBULATORY_CARE_PROVIDER_SITE_OTHER): Payer: BC Managed Care – PPO | Admitting: Cardiovascular Disease

## 2013-04-03 ENCOUNTER — Encounter: Payer: Self-pay | Admitting: Cardiovascular Disease

## 2013-04-03 VITALS — BP 170/118 | HR 92 | Ht 66.0 in | Wt 252.0 lb

## 2013-04-03 DIAGNOSIS — I10 Essential (primary) hypertension: Secondary | ICD-10-CM

## 2013-04-03 DIAGNOSIS — R0609 Other forms of dyspnea: Secondary | ICD-10-CM

## 2013-04-03 DIAGNOSIS — R9431 Abnormal electrocardiogram [ECG] [EKG]: Secondary | ICD-10-CM

## 2013-04-03 DIAGNOSIS — R0600 Dyspnea, unspecified: Secondary | ICD-10-CM

## 2013-04-03 NOTE — Patient Instructions (Addendum)
Your physician recommends that you schedule a follow-up appointment in: as needed basis  

## 2013-04-03 NOTE — Assessment & Plan Note (Signed)
Functional  Normal EF by echo and myovue with no valve disease or signs of pulmonary hypertension

## 2013-04-03 NOTE — Assessment & Plan Note (Signed)
Insignificant Q in lead 3 possible LVH no ischemia on myovue  Stable

## 2013-04-03 NOTE — Assessment & Plan Note (Signed)
She tends to adjust her meds by herself.  Told her to go back to taking a full 5mg  of norvasc and take this at different time than benicar / HCTZ  Discussed low sodium diet F/U primary

## 2013-04-03 NOTE — Progress Notes (Signed)
Patient ID: Shannon Oliver, female   DOB: [birth year], 52 y.o.   MRN: [id number] 52 yo obese female with exertional dyspnea, edema, blurry vision and multiple CRF;s including DM, HTN and elevated lipids Referred by primary for abnormal ECG. Reviewed ECG from 01/08/13 SR rate 82 Q in lead 3 and V12 No change from ECG 07/25/12 In fact she had more LVH with strain and worse ECG in 2013. She has poor insight into low carb diet. Her A1c is in 9 range. She lost her mother 2 years ago and helps care for 4 adopted kids. 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 BP meds also for 2 years but not always compliant. Blurry vision last 6 months and has appt with eye doctor this month. Exertional dyspnea since weight gone No PND, orthopnea. Chronic mild dependant LE edema. No history of DVT. No previous ETT.   Echo 01/31/13 reviewed normal EF 55-60% 02/12/13  myovue normal EF 66%  Doing a bit better with diet F/U A1c with primary next week.  Only taking 1/2 of her norvasc and taking it with benicar in am   ROS: Denies fever, malais, weight loss, blurry vision, decreased visual acuity, cough, sputum, SOB, hemoptysis, pleuritic pain, palpitaitons, heartburn, abdominal pain, melena, lower extremity edema, claudication, or rash.  All other systems reviewed and negative  General: Affect appropriate Obese black female HEENT: normal Neck supple with no adenopathy JVP normal no bruits no thyromegaly Lungs clear with no wheezing and good diaphragmatic motion Heart:  S1/S2 no murmur, no rub, gallop or click PMI normal Abdomen: benighn, BS positve, no tenderness, no AAA no bruit.  No HSM or HJR Distal pulses intact with no bruits No edema Neuro non-focal Skin warm and dry No muscular weakness   Current Outpatient Prescriptions  Medication Sig Dispense Refill  . amLODipine (NORVASC) 5 MG tablet Take 5 mg by mouth daily.      Marland Kitchen glimepiride (AMARYL) 2 MG tablet Take 2 mg by mouth daily  before breakfast.      . HYDROcodone-acetaminophen (NORCO/VICODIN) 5-325 MG per tablet Take 2 tablets by mouth every 4 (four) hours as needed for pain.  6 tablet  0  . meloxicam (MOBIC) 15 MG tablet Take 15 mg by mouth as needed.      . metFORMIN (GLUCOPHAGE) 500 MG tablet Take 500 mg by mouth 2 (two) times daily with a meal.      . olmesartan-hydrochlorothiazide (BENICAR HCT) 40-25 MG per tablet Take 1 tablet by mouth daily.      . ondansetron (ZOFRAN) 4 MG tablet Take 1 tablet (4 mg total) by mouth every 6 (six) hours.  12 tablet  0   No current facility-administered medications for this visit.    Allergies  Review of patient's allergies indicates no known allergies.  Electrocardiogram:  01/08/13  SR rate 82 Q lead 3 possible LVH in precordial leads   Assessment and Plan

## 2013-05-10 ENCOUNTER — Other Ambulatory Visit: Payer: Self-pay

## 2013-06-14 ENCOUNTER — Encounter (HOSPITAL_COMMUNITY): Payer: Self-pay | Admitting: Emergency Medicine

## 2013-06-14 ENCOUNTER — Emergency Department (HOSPITAL_COMMUNITY)
Admission: EM | Admit: 2013-06-14 | Discharge: 2013-06-14 | Disposition: A | Discharge status: Home / Self Care | Payer: BC Managed Care – PPO | Source: Home / Self Care | Attending: Family Medicine | Admitting: Family Medicine

## 2013-06-14 DIAGNOSIS — J40 Bronchitis, not specified as acute or chronic: Secondary | ICD-10-CM

## 2013-06-14 MED ORDER — PREDNISONE 20 MG PO TABS: 40.0000 mg | ORAL_TABLET | Freq: Every day | ORAL | Status: DC

## 2013-06-14 MED ORDER — AZITHROMYCIN 250 MG PO TABS: 250.0000 mg | ORAL_TABLET | Freq: Every day | ORAL | Status: DC

## 2013-06-14 MED ORDER — ALBUTEROL SULFATE (5 MG/ML) 0.5% IN NEBU
INHALATION_SOLUTION | RESPIRATORY_TRACT | Status: AC
  Filled 2013-06-14: qty 1

## 2013-06-14 MED ORDER — ALBUTEROL SULFATE HFA 108 (90 BASE) MCG/ACT IN AERS: 2.0000 | INHALATION_SPRAY | Freq: Four times a day (QID) | RESPIRATORY_TRACT | Status: DC | PRN

## 2013-06-14 MED ORDER — IPRATROPIUM BROMIDE 0.02 % IN SOLN
0.5000 mg | Freq: Once | RESPIRATORY_TRACT | Status: AC
  Administered 2013-06-14: 0.5 mg via RESPIRATORY_TRACT

## 2013-06-14 MED ORDER — GUAIFENESIN-CODEINE 100-10 MG/5ML PO SOLN: 5.0000 mL | Freq: Every evening | ORAL | Status: DC | PRN

## 2013-06-14 MED ORDER — IPRATROPIUM BROMIDE 0.02 % IN SOLN
RESPIRATORY_TRACT | Status: AC
  Filled 2013-06-14: qty 2.5

## 2013-06-14 MED ORDER — ALBUTEROL SULFATE (5 MG/ML) 0.5% IN NEBU
5.0000 mg | INHALATION_SOLUTION | Freq: Once | RESPIRATORY_TRACT | Status: AC
  Administered 2013-06-14: 5 mg via RESPIRATORY_TRACT

## 2013-06-14 MED ORDER — IPRATROPIUM BROMIDE 0.06 % NA SOLN: 2.0000 | Freq: Four times a day (QID) | NASAL | Status: DC

## 2013-06-14 NOTE — ED Notes (Signed)
Pt  Reports  Symptoms  Of being  Hoarse      With  Congested  Wheezing         Cough  And  Shortness  Of  Breath    Symptoms  X  2  Days

## 2013-06-14 NOTE — ED Provider Notes (Signed)
Shannon Oliver is a 52 y.o. female who presents to Urgent Care today for coarse voice cough congestion and wheezing. This is been present for about 3 days. Patient has used over-the-counter cough medication; but somewhat helpful. Denies vomiting diarrhea fevers or chills. He denies any history of asthma but does note history of frequent bronchitis. She notes a mild subjective shortness of breath. No nausea vomiting diarrhea or chest pain.   Past Medical History  Diagnosis Date  . Hypertension   . Diabetes mellitus     A1C over 9  . Acid reflux   . Obesity   . HLD (hyperlipidemia)   . Abnormal EKG     LVH with strain  . Noncompliance    History  Substance Use Topics  . Smoking status: Never Smoker   . Smokeless tobacco: Not on file  . Alcohol Use: No   ROS as above Medications reviewed. No current facility-administered medications for this encounter.   Current Outpatient Prescriptions  Medication Sig Dispense Refill  . albuterol (PROVENTIL HFA;VENTOLIN HFA) 108 (90 BASE) MCG/ACT inhaler Inhale 2 puffs into the lungs every 6 (six) hours as needed for wheezing or shortness of breath.  1 Inhaler  2  . amLODipine (NORVASC) 5 MG tablet Take 5 mg by mouth daily.      Marland Kitchen azithromycin (ZITHROMAX) 250 MG tablet Take 1 tablet (250 mg total) by mouth daily. Take first 2 tablets together, then 1 every day until finished.  6 tablet  0  . glimepiride (AMARYL) 2 MG tablet Take 2 mg by mouth daily before breakfast.      . guaiFENesin-codeine 100-10 MG/5ML syrup Take 5 mLs by mouth at bedtime as needed for cough.  120 mL  0  . ipratropium (ATROVENT) 0.06 % nasal spray Place 2 sprays into both nostrils 4 (four) times daily.  15 mL  1  . meloxicam (MOBIC) 15 MG tablet Take 15 mg by mouth as needed.      . metFORMIN (GLUCOPHAGE) 500 MG tablet Take 500 mg by mouth 2 (two) times daily with a meal.      . olmesartan-hydrochlorothiazide (BENICAR HCT) 40-25 MG per tablet Take 1 tablet by mouth daily.       . predniSONE (DELTASONE) 20 MG tablet Take 2 tablets (40 mg total) by mouth daily.  10 tablet  0    Exam:  BP 164/93  Pulse 91  Temp(Src) 99.4 F (37.4 C) (Oral)  Resp 16  SpO2 98%  LMP 05/24/2012 Gen: Well NAD HEENT: EOMI,  MMM, tympanic membranes are normal appearing bilaterally. Posterior pharynx with mild erythema and cobblestoning. Lungs: Normal work of breathing. Prolonged expiratory phase bilaterally. Heart: RRR no MRG Abd: NABS, Soft. NT, ND Exts: Non edematous BL  LE, warm and well perfused.   Patient was given a DuoNeb nebulizer treatment and had considerable improvement in symptoms.   Assessment and Plan: 52 y.o. female with bronchitis. Plan for treatment with prednisone, albuterol, Atrovent, azithromycin nasal spray, and codeine containing cough medication. Work note provided. Followup with primary care provider Discussed warning signs or symptoms. Please see discharge instructions. Patient expresses understanding.    '  Rodolph Bong, MD 06/14/13 [phone]

## 2013-08-02 ENCOUNTER — Emergency Department (HOSPITAL_COMMUNITY)
Admission: EM | Admit: 2013-08-02 | Discharge: 2013-08-02 | Disposition: A | Discharge status: Home / Self Care | Payer: BC Managed Care – PPO | Source: Home / Self Care

## 2013-08-02 ENCOUNTER — Encounter (HOSPITAL_COMMUNITY): Payer: Self-pay | Admitting: Emergency Medicine

## 2013-08-02 DIAGNOSIS — R509 Fever, unspecified: Secondary | ICD-10-CM

## 2013-08-02 DIAGNOSIS — N3289 Other specified disorders of bladder: Secondary | ICD-10-CM

## 2013-08-02 DIAGNOSIS — N390 Urinary tract infection, site not specified: Secondary | ICD-10-CM

## 2013-08-02 LAB — POCT URINALYSIS DIP (DEVICE)
BILIRUBIN URINE: NEGATIVE
GLUCOSE, UA: 500 mg/dL — AB
KETONES UR: NEGATIVE mg/dL
Nitrite: NEGATIVE
Protein, ur: 100 mg/dL — AB
SPECIFIC GRAVITY, URINE: 1.025 (ref 1.005–1.030)
Urobilinogen, UA: 0.2 mg/dL (ref 0.0–1.0)
pH: 6 (ref 5.0–8.0)

## 2013-08-02 MED ORDER — CEFTRIAXONE SODIUM 1 G IJ SOLR
1.0000 g | Freq: Once | INTRAMUSCULAR | Status: AC
  Administered 2013-08-02: 1 g via INTRAMUSCULAR

## 2013-08-02 MED ORDER — CEFTRIAXONE SODIUM 1 G IJ SOLR
INTRAMUSCULAR | Status: AC
  Filled 2013-08-02: qty 10

## 2013-08-02 MED ORDER — CEPHALEXIN 500 MG PO CAPS: 500.0000 mg | ORAL_CAPSULE | Freq: Four times a day (QID) | ORAL | Status: DC

## 2013-08-02 MED ORDER — LIDOCAINE HCL (PF) 1 % IJ SOLN
INTRAMUSCULAR | Status: AC
  Filled 2013-08-02: qty 5

## 2013-08-02 NOTE — Discharge Instructions (Signed)
Dysuria AZO at the drug store for symptom relief Urinary Tract Infection Urinary tract infections (UTIs) can develop anywhere along your urinary tract. Your urinary tract is your body's drainage system for removing wastes and extra water. Your urinary tract includes two kidneys, two ureters, a bladder, and a urethra. Your kidneys are a pair of bean-shaped organs. Each kidney is about the size of your fist. They are located below your ribs, one on each side of your spine. CAUSES Infections are caused by microbes, which are microscopic organisms, including fungi, viruses, and bacteria. These organisms are so small that they can only be seen through a microscope. Bacteria are the microbes that most commonly cause UTIs. SYMPTOMS  Symptoms of UTIs may vary by age and gender of the patient and by the location of the infection. Symptoms in young women typically include a frequent and intense urge to urinate and a painful, burning feeling in the bladder or urethra during urination. Older women and men are more likely to be tired, shaky, and weak and have muscle aches and abdominal pain. A fever may mean the infection is in your kidneys. Other symptoms of a kidney infection include pain in your back or sides below the ribs, nausea, and vomiting. DIAGNOSIS To diagnose a UTI, your caregiver will ask you about your symptoms. Your caregiver also will ask to provide a urine sample. The urine sample will be tested for bacteria and white blood cells. White blood cells are made by your body to help fight infection. TREATMENT  Typically, UTIs can be treated with medication. Because most UTIs are caused by a bacterial infection, they usually can be treated with the use of antibiotics. The choice of antibiotic and length of treatment depend on your symptoms and the type of bacteria causing your infection. HOME CARE INSTRUCTIONS  If you were prescribed antibiotics, take them exactly as your caregiver instructs you. Finish  the medication even if you feel better after you have only taken some of the medication.  Drink enough water and fluids to keep your urine clear or pale yellow.  Avoid caffeine, tea, and carbonated beverages. They tend to irritate your bladder.  Empty your bladder often. Avoid holding urine for long periods of time.  Empty your bladder before and after sexual intercourse.  After a bowel movement, women should cleanse from front to back. Use each tissue only once. SEEK MEDICAL CARE IF:   You have back pain.  You develop a fever.  Your symptoms do not begin to resolve within 3 days. SEEK IMMEDIATE MEDICAL CARE IF:   You have severe back pain or lower abdominal pain.  You develop chills.  You have nausea or vomiting.  You have continued burning or discomfort with urination. MAKE SURE YOU:   Understand these instructions.  Will watch your condition.  Will get help right away if you are not doing well or get worse. Document Released: 03/31/2005 Document Revised: 12/21/2011 Document Reviewed: 07/30/2011 [facility] Patient Information 2014 [place].  Overactive Bladder, Adult The bladder has two functions that are totally opposite of the other. One is to relax and stretch out so it can store urine (fills like a balloon), and the other is to contract and squeeze down so that it can empty the urine that it has stored. Proper functioning of the bladder is a complex mixing of these two functions. The filling and emptying of the bladder can be influenced by:  The bladder.  The spinal cord.  The brain.  The nerves going to the bladder.  Other organs that are closely related to the bladder such as prostate in males and the vagina in females. As your bladder fills with urine, nerve signals are sent from the bladder to the brain to tell you that you may need to urinate. Normal urination requires that the bladder squeeze down with sufficient strength to empty the bladder, but  this also requires that the bladder squeeze down sufficiently long to finish the job. In addition the sphincter muscles, which normally keep you from leaking urine, must also relax so that the urine can pass. Coordination between the bladder muscle squeezing down and the sphincter muscles relaxing is required to make everything happen normally. With an overactive bladder sometimes the muscles of the bladder contract unexpectedly and involuntarily and this causes an urgent need to urinate. The normal response is to try to hold urine in by contracting the sphincter muscles. Sometimes the bladder contracts so strongly that the sphincter muscles cannot stop the urine from passing out and incontinence occurs. This kind of incontinence is called urge incontinence. Having an overactive bladder can be embarrassing and awkward. It can keep you from living life the way you want to. Many people think it is just something you have to put up with as you grow older or have certain health conditions. In fact, there are treatments that can help make your life easier and more pleasant. CAUSES  Many things can cause an overactive bladder. Possibilities include:  Urinary tract infection or infection of nearby tissues such as the prostate.  Prostate enlargement.  In women, multiple pregnancies or surgery on the uterus or urethra.  Bladder stones, inflammation or tumors.  Caffeine.  Alcohol.  Medications. For example, diuretics (drugs that help the body get rid of extra fluid) increase urine production. Some other medicines must be taken with lots of fluids.  Muscle or nerve weakness. This might be the result of a spinal cord injury, a stroke, multiple sclerosis or Parkinson's disease.  Diabetes can cause a high urine volume which fills the bladder so quickly that the normal urge to urinate is triggered very strongly. SYMPTOMS   Loss of bladder control. You feel the need to urinate and cannot make your body  wait.  Sudden, strong urges to urinate.  Urinating 8 or more times a day.  Waking up to urinate two or more times a night. DIAGNOSIS  To decide if you have overactive bladder, your healthcare provider will probably:  Ask about symptoms you have noticed.  Ask about your overall health. This will include questions about any medications you are taking.  Do a physical examination. This will help determine if there are obvious blockages or other problems.  Order some tests. These might include:  A blood test to check for diabetes or other health issues that could be contributing to the problem.  Urine testing. This could measure the flow of urine and the pressure on the bladder.  A test of your neurological system (the brain, spinal cord and nerves). This is the system that senses the need to urinate. Some of these tests are called flow tests, bladder pressure tests and electrical measurements of the sphincter muscle.  A bladder test to check whether it is emptying completely when you urinate.  Cytoscopy. This test uses a thin tube with a tiny camera on it. It offers a look inside your urethra and bladder to see if there are problems.  Imaging tests. You might be given  a contrast dye and then asked to urinate. X-rays are taken to see how your bladder is working. TREATMENT  An overactive bladder can be treated in many ways. The treatment will depend on the cause. Whether you have a mild or severe case also makes a difference. Often, treatment can be given in your healthcare provider's office or clinic. Be sure to discuss the different options with your caregiver. They include:  Behavioral treatments. These do not involve medication or surgery:  Bladder training. For this, you would follow a schedule to urinate at regular intervals. This helps you learn to control the urge to urinate. At first, you might be asked to wait a few minutes after feeling the urge. In time, you should be able to  schedule bathroom visits an hour or more apart.  Kegel exercises. These exercises strengthen the pelvic floor muscles, which support the bladder. By toning these muscles, they can help control urination, even if the bladder muscles are overactive. A specialist will teach you how to do these exercises correctly. They will require daily practice.  Weight loss. If you are obese or overweight, losing weight might stop your bladder from being overactive. Talk to your healthcare provider about how many pounds you should lose. Also ask if there is a specific program or method that would work best for you.  Diet change. This might be suggested if constipation is making your overactive bladder worse. Your healthcare provider or a nutritionist can explain ways to change what you eat to ease constipation. Other people might need to take in less caffeine or alcohol. Sometimes drinking fewer fluids is needed, too.  Protection. This is not an actual treatment. But, you could wear special pads to take care of any leakage while you wait for other treatments to take effect. This will help you avoid embarrassment.  Physical treatments.  Electrical stimulation. Electrodes will send gentle pulses to the nerves or muscles that help control the bladder. The goal is to strengthen them. Sometimes this is done with the electrodes outside of the body. Or, they might be placed inside the body (implanted). This treatment can take several months to have an effect.  Medications. These are usually used along with other treatments. Several medicines are available. Some are injected into the muscles involved in urination. Others come in pill form. Medications sometimes prescribed include:  Anticholinergics. These drugs block the signals that the nerves deliver to the bladder. This keeps it from releasing urine at the wrong time. Researchers think the drugs might help in other ways, too.  Imipramine. This is an antidepressant. But,  it relaxes bladder muscles.  Botox. This is still experimental. Some people believe that injecting it into the bladder muscles will relax them so they work more normally. It has also been injected into the sphincter muscle when the sphincter muscle does not open properly. This is a temporary fix, however. Also, it might make matters worse, especially in older people.  Surgery.  A device might be implanted to help manage your nerves. It works on the nerves that signal when you need to urinate.  Surgery is sometimes needed with electrical stimulation. If the electrodes are implanted, this is done through surgery.  Sometimes repairs need to be made through surgery. For example, the size of the bladder can be changed. This is usually done in severe cases only. HOME CARE INSTRUCTIONS   Take any medications your healthcare provider prescribed or suggested. Follow the directions carefully.  Practice any lifestyle changes  that are recommended. These might include:  Drinking less fluid or drinking at different times of the day. If you need to urinate often during the night, for example, you may need to stop drinking fluids early in the evening.  Cutting down on caffeine or alcohol. They can both make an overactive bladder worse. Caffeine is found in coffee, tea and sodas.  Doing Kegel exercises to strengthen muscles.  Losing weight, if that is recommended.  Eating a healthy and balanced diet. This will help you avoid constipation.  Keep a journal or a log. You might be asked to record how much you drink and when, and also when you feel the need to urinate.  Learn how to care for implants or other devices, such as pessaries. SEEK MEDICAL CARE IF:   Your overactive bladder gets worse.  You feel increased pain or irritation when you urinate.  You notice blood in your urine.  You have questions about any medications or devices that your healthcare provider recommended.  You notice blood,  pus or swelling at the site of any test or treatment procedure.  You have an oral temperature above 102 F (38.9 C). SEEK IMMEDIATE MEDICAL CARE IF:  You have an oral temperature above 102 F (38.9 C), not controlled by medicine. Document Released: 04/17/2009 Document Revised: 09/13/2011 Document Reviewed: 04/17/2009 [facility] Patient Information 2014 [place].  Dysuria is the medical term for pain with urination. There are many causes for dysuria, but urinary tract infection is the most common. If a urinalysis was performed it can show that there is a urinary tract infection. A urine culture confirms that you or your child is sick. You will need to follow up with a healthcare provider because:  If a urine culture was done you will need to know the culture results and treatment recommendations.  If the urine culture was positive, you or your child will need to be put on antibiotics or know if the antibiotics prescribed are the right antibiotics for your urinary tract infection.  If the urine culture is negative (no urinary tract infection), then other causes may need to be explored or antibiotics need to be stopped. Today laboratory work may have been done and there does not seem to be an infection. If cultures were done they will take at least 24 to 48 hours to be completed. Today x-rays may have been taken and they read as normal. No cause can be found for the problems. The x-rays may be re-read by a radiologist and you will be contacted if additional findings are made. You or your child may have been put on medications to help with this problem until you can see your primary caregiver. If the problems get better, see your primary caregiver if the problems return. If you were given antibiotics (medications which kill germs), take all of the mediations as directed for the full course of treatment.  If laboratory work was done, you need to find the results. Leave a telephone number where  you can be reached. If this is not possible, make sure you find out how you are to get test results. HOME CARE INSTRUCTIONS   Drink lots of fluids. For adults, drink eight, 8 ounce glasses of clear juice or water a day. For children, replace fluids as suggested by your caregiver.  Empty the bladder often. Avoid holding urine for long periods of time.  After a bowel movement, women should cleanse front to back, using each tissue only once.  Empty your bladder before and after sexual intercourse.  Take all the medicine given to you until it is gone. You may feel better in a few days, but TAKE ALL MEDICINE.  Avoid caffeine, tea, alcohol and carbonated beverages, because they tend to irritate the bladder.  In men, alcohol may irritate the prostate.  Only take over-the-counter or prescription medicines for pain, discomfort, or fever as directed by your caregiver.  If your caregiver has given you a follow-up appointment, it is very important to keep that appointment. Not keeping the appointment could result in a chronic or permanent injury, pain, and disability. If there is any problem keeping the appointment, you must call back to this facility for assistance. SEEK IMMEDIATE MEDICAL CARE IF:   Back pain develops.  A fever develops.  There is nausea (feeling sick to your stomach) or vomiting (throwing up).  Problems are no better with medications or are getting worse. MAKE SURE YOU:   Understand these instructions.  Will watch your condition.  Will get help right away if you are not doing well or get worse. Document Released: 03/19/2004 Document Revised: 09/13/2011 Document Reviewed: 01/25/2008 [facility] Patient Information 2014 [place].

## 2013-08-02 NOTE — ED Provider Notes (Signed)
Medical screening examination/treatment/procedure(s) were performed by non-physician practitioner and as supervising physician I was immediately available for consultation/collaboration.  Lennie Vasco, M.D.   Ruby Logiudice C Laverna Dossett, MD 08/02/13 1859 

## 2013-08-02 NOTE — ED Provider Notes (Signed)
CSN: [id number]     Arrival date & time 08/02/13  0920 History   First MD Initiated Contact with Patient 08/02/13 1014     Chief Complaint  Patient presents with  . Generalized Body Aches  . Fever   (Consider location/radiation/quality/duration/timing/severity/associated sxs/prior Treatment) HPI Comments: Obese 53 year old female presents with complaints of fever and body aches in association with urinary urgency, poor bladder control and mild dysuria. She is a type II diabetic. The symptoms are new with an onset in the past 2-3 days. Denies upper respiratory congestion only she does clear her throat frequently.   Past Medical History  Diagnosis Date  . Hypertension   . Diabetes mellitus     A1C over 9  . Acid reflux   . Obesity   . HLD (hyperlipidemia)   . Abnormal EKG     LVH with strain  . Noncompliance    Past Surgical History  Procedure Laterality Date  . Knee arthroscopy w/ meniscal repair Right    History reviewed. No pertinent family history. History  Substance Use Topics  . Smoking status: Never Smoker   . Smokeless tobacco: Not on file  . Alcohol Use: No   OB History   Grav Para Term Preterm Abortions TAB SAB Ect Mult Living                 Review of Systems  Constitutional: Positive for fever and activity change.  HENT: Negative.   Respiratory: Positive for cough. Negative for shortness of breath and wheezing.   Cardiovascular: Negative.   Gastrointestinal: Negative.   Genitourinary: Positive for dysuria, urgency, frequency and hematuria. Negative for vaginal bleeding, vaginal discharge and vaginal pain.  Musculoskeletal: Negative.   Neurological: Negative.     Allergies  Review of patient's allergies indicates no known allergies.  Home Medications   Current Outpatient Rx  Name  Route  Sig  Dispense  Refill  . albuterol (PROVENTIL HFA;VENTOLIN HFA) 108 (90 BASE) MCG/ACT inhaler   Inhalation   Inhale 2 puffs into the lungs every 6 (six) hours as  needed for wheezing or shortness of breath.   1 Inhaler   2   . amLODipine (NORVASC) 5 MG tablet   Oral   Take 5 mg by mouth daily.         Marland Kitchen azithromycin (ZITHROMAX) 250 MG tablet   Oral   Take 1 tablet (250 mg total) by mouth daily. Take first 2 tablets together, then 1 every day until finished.   6 tablet   0   . cephALEXin (KEFLEX) 500 MG capsule   Oral   Take 1 capsule (500 mg total) by mouth 4 (four) times daily.   40 capsule   0   . glimepiride (AMARYL) 2 MG tablet   Oral   Take 2 mg by mouth daily before breakfast.         . guaiFENesin-codeine 100-10 MG/5ML syrup   Oral   Take 5 mLs by mouth at bedtime as needed for cough.   120 mL   0   . ipratropium (ATROVENT) 0.06 % nasal spray   Each Nare   Place 2 sprays into both nostrils 4 (four) times daily.   15 mL   1   . meloxicam (MOBIC) 15 MG tablet   Oral   Take 15 mg by mouth as needed.         . metFORMIN (GLUCOPHAGE) 500 MG tablet   Oral   Take 500 mg by mouth  2 (two) times daily with a meal.         . olmesartan-hydrochlorothiazide (BENICAR HCT) 40-25 MG per tablet   Oral   Take 1 tablet by mouth daily.         . predniSONE (DELTASONE) 20 MG tablet   Oral   Take 2 tablets (40 mg total) by mouth daily.   10 tablet   0    BP 117/82  Pulse 97  Temp(Src) 98.8 F (37.1 C) (Oral)  Resp 18  SpO2 97%  LMP 08/02/2013 Physical Exam  Nursing note and vitals reviewed. Constitutional: She is oriented to person, place, and time. She appears well-developed and well-nourished. No distress.  HENT:  Mouth/Throat: Oropharynx is clear and moist.  Clear PND  Eyes: Conjunctivae and EOM are normal.  Neck: Normal range of motion. Neck supple.  Cardiovascular: Normal rate and normal heart sounds.   Pulmonary/Chest: Effort normal and breath sounds normal. No respiratory distress. She has no wheezes. She has no rales.  Abdominal: Soft. Bowel sounds are normal. She exhibits no distension and no mass.  There is no tenderness. There is no rebound and no guarding.  Lymphadenopathy:    She has no cervical adenopathy.  Neurological: She is alert and oriented to person, place, and time.  Skin: Skin is warm and dry.  Psychiatric: She has a normal mood and affect.    ED Course  Procedures (including critical care time) Labs Review Labs Reviewed  POCT URINALYSIS DIP (DEVICE) - Abnormal; Notable for the following:    Glucose, UA 500 (*)    Hgb urine dipstick LARGE (*)    Protein, ur 100 (*)    Leukocytes, UA TRACE (*)    All other components within normal limits  URINE CULTURE   Imaging Review No results found. Results for orders placed during the hospital encounter of 08/02/13  POCT URINALYSIS DIP (DEVICE)      Result Value Range   Glucose, UA 500 (*) NEGATIVE mg/dL   Bilirubin Urine NEGATIVE  NEGATIVE   Ketones, ur NEGATIVE  NEGATIVE mg/dL   Specific Gravity, Urine 1.025  1.005 - 1.030   Hgb urine dipstick LARGE (*) NEGATIVE   pH 6.0  5.0 - 8.0   Protein, ur 100 (*) NEGATIVE mg/dL   Urobilinogen, UA 0.2  0.0 - 1.0 mg/dL   Nitrite NEGATIVE  NEGATIVE   Leukocytes, UA TRACE (*) NEGATIVE      MDM   1. UTI (lower urinary tract infection)   2. Bladder spasms   3. Fever      The patient's history, symptoms and urinalysis strongly suggest a urinary tract infection. She may be having a fever and body aches associated with this, or she may have a UTI simultaneously with a viral syndrome.  Keflex Tylenol Plenty of fluids AZO Urine culture results pending Return or see your PCP if getting worse any symptoms or problems.  Janne Napoleon, NP 08/02/13 1133  Janne Napoleon, NP 08/02/13 1135

## 2013-08-02 NOTE — ED Notes (Signed)
C/o body aches and fever States feels as if its the flu States she is dehydrated States she has back pain down her neck

## 2013-08-03 LAB — URINE CULTURE

## 2013-10-16 ENCOUNTER — Ambulatory Visit: Payer: BC Managed Care – PPO

## 2013-10-16 ENCOUNTER — Ambulatory Visit (INDEPENDENT_AMBULATORY_CARE_PROVIDER_SITE_OTHER): Payer: BC Managed Care – PPO | Admitting: Family Medicine

## 2013-10-16 VITALS — BP 160/100 | HR 82 | Temp 98.3°F | Resp 16 | Ht 65.5 in | Wt 248.0 lb

## 2013-10-16 DIAGNOSIS — M542 Cervicalgia: Secondary | ICD-10-CM

## 2013-10-16 DIAGNOSIS — S139XXA Sprain of joints and ligaments of unspecified parts of neck, initial encounter: Secondary | ICD-10-CM

## 2013-10-16 DIAGNOSIS — I10 Essential (primary) hypertension: Secondary | ICD-10-CM

## 2013-10-16 DIAGNOSIS — S161XXA Strain of muscle, fascia and tendon at neck level, initial encounter: Secondary | ICD-10-CM

## 2013-10-16 DIAGNOSIS — E119 Type 2 diabetes mellitus without complications: Secondary | ICD-10-CM

## 2013-10-16 LAB — POCT GLYCOSYLATED HEMOGLOBIN (HGB A1C): Hemoglobin A1C: 8.4

## 2013-10-16 LAB — GLUCOSE, POCT (MANUAL RESULT ENTRY): POC Glucose: 171 mg/dl — AB (ref 70–99)

## 2013-10-16 IMAGING — CR DG CERVICAL SPINE COMPLETE 4+V
5 series · 5 of 5 positions shown · non-contrast
Comparison: None.

CLINICAL DATA: Neck pain

EXAM:
CERVICAL SPINE  4+ VIEWS

[lpo]
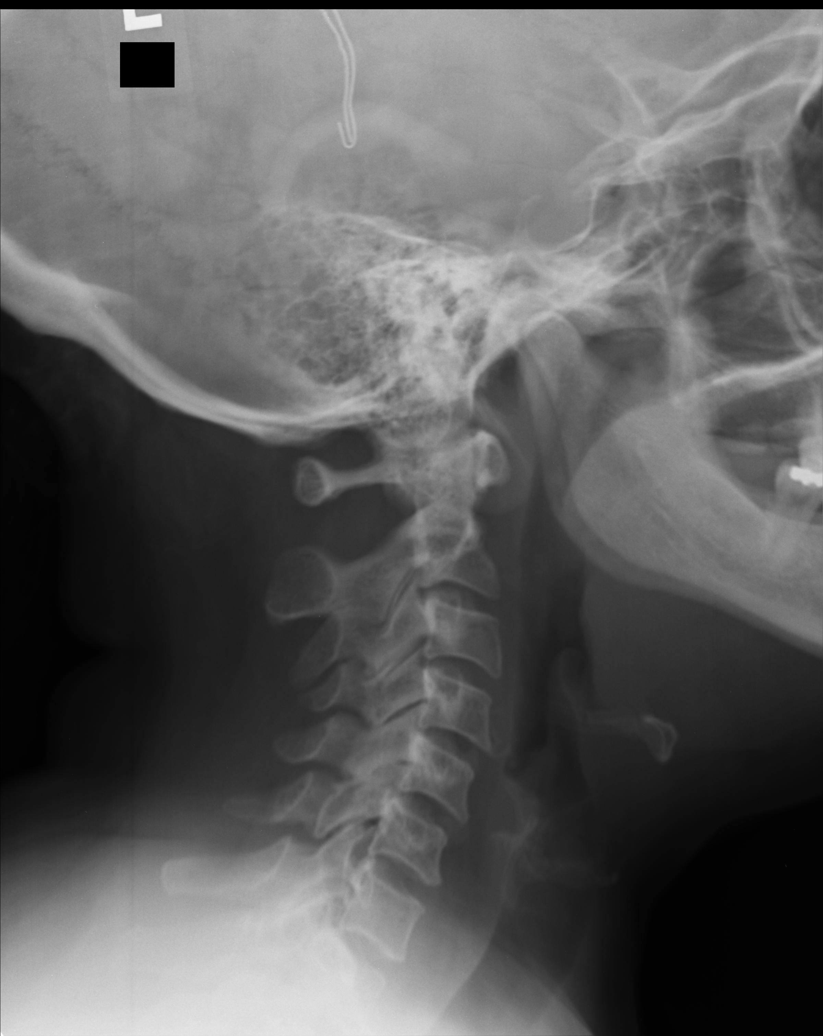

[lateral]
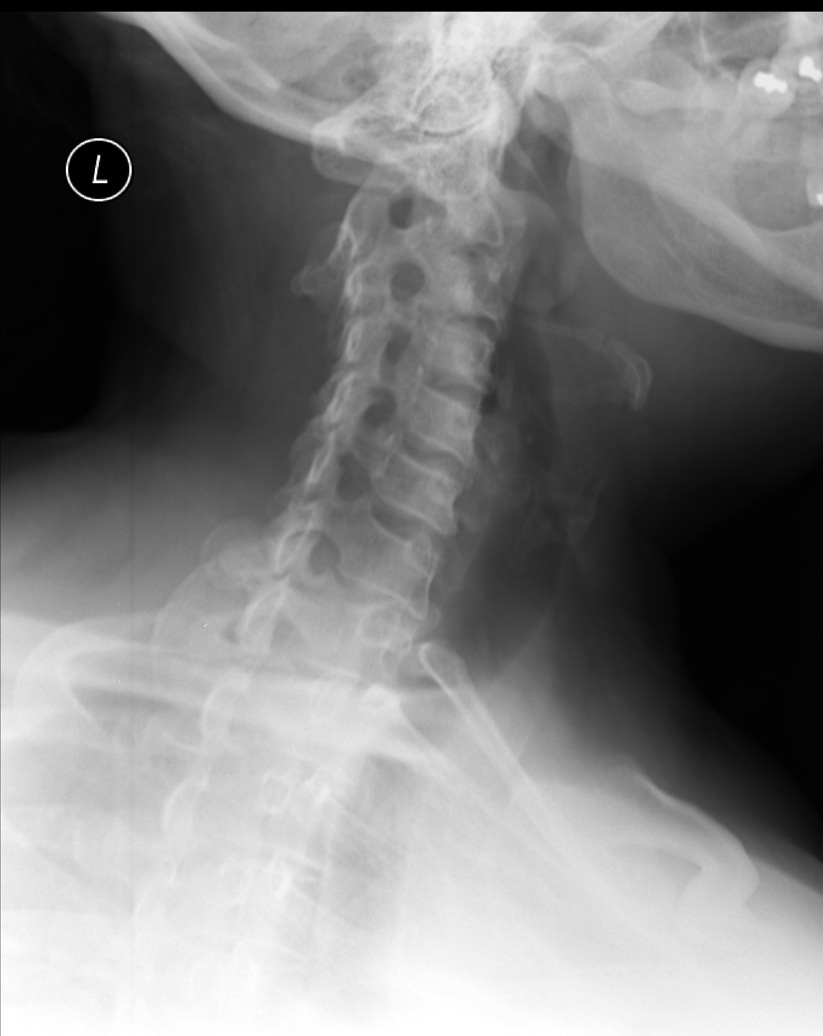

[rpo]
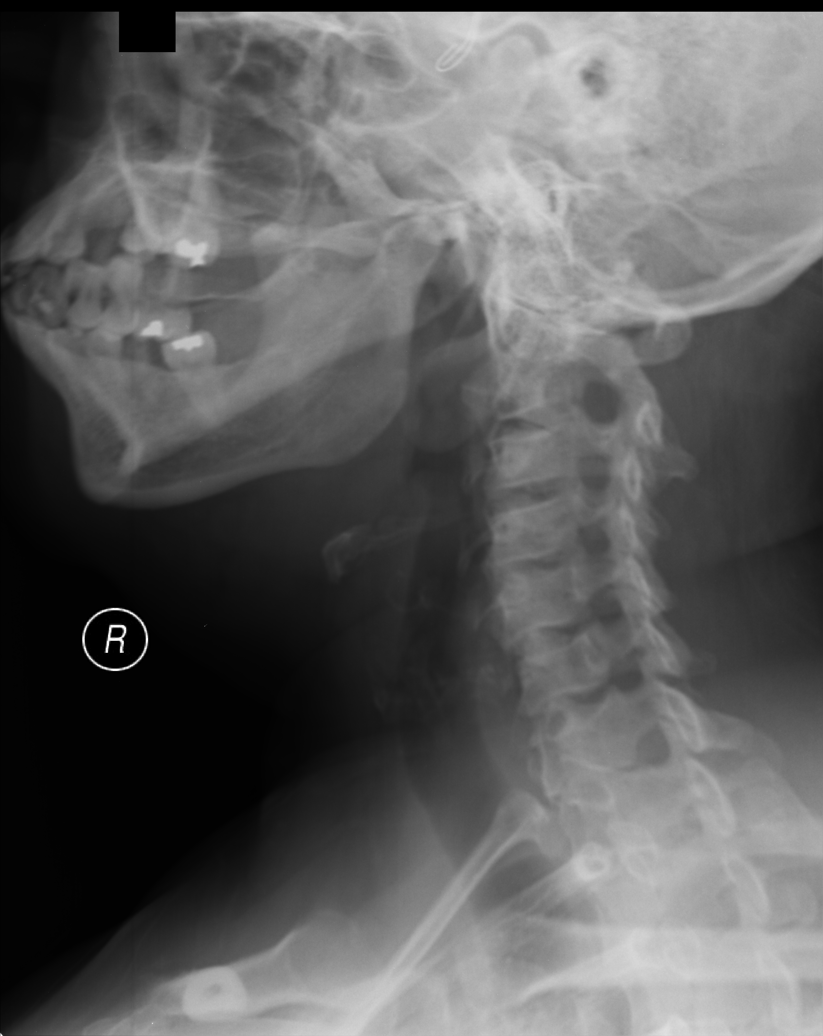

[AP]
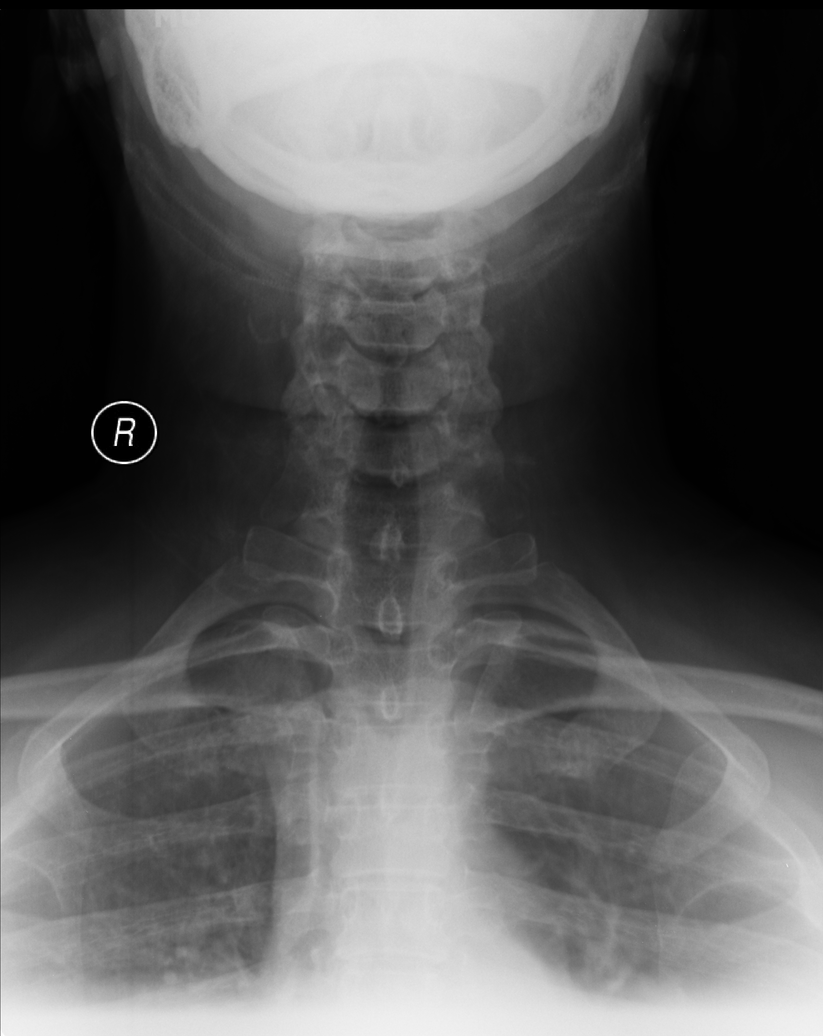

[ap open mouth]
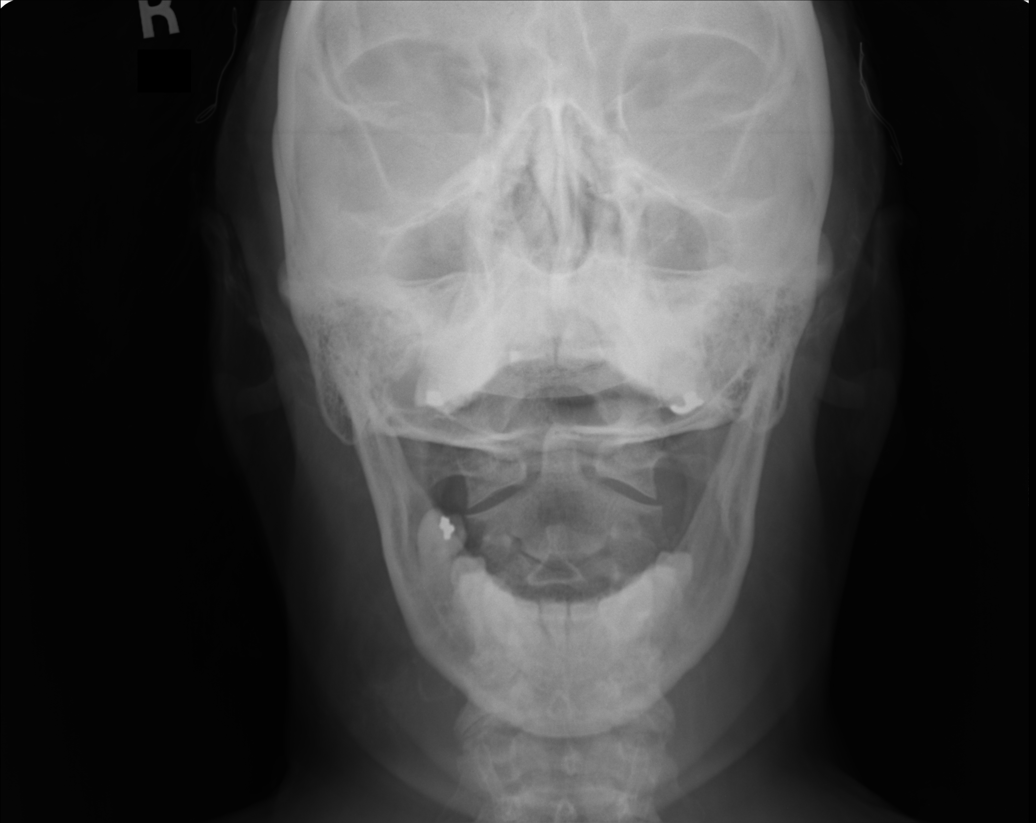

[5 of 5 positions shown; findings below may reference images not displayed]

FINDINGS: No acute fracture or malalignment. No prevertebral soft tissue
swelling. No significant foraminal stenosis on the obliques views.
Very mild C5-C6 facet arthropathy. The visualized upper lungs are
unremarkable. No focal soft tissue abnormality.
IMPRESSION: Negative cervical spine radiographs.

## 2013-10-16 MED ORDER — MELOXICAM 15 MG PO TABS: 15.0000 mg | ORAL_TABLET | Freq: Every day | ORAL | Status: DC

## 2013-10-16 MED ORDER — METHOCARBAMOL 500 MG PO TABS: 500.0000 mg | ORAL_TABLET | Freq: Every evening | ORAL | Status: DC | PRN

## 2013-10-16 NOTE — Progress Notes (Addendum)
Subjective:  This chart was scribed for Reginia Forts, MD by Donato Schultz, Medical Scribe. This patient was seen in Room 8 and the patient's care was started at 5:14 PM.   Patient ID: Shannon Oliver, female    DOB: 1961-01-25, 53 y.o.   MRN: [id number]  HPI HPI Comments: Shannon Oliver is a 53 y.o. female with a history of DM and Hypertension who presents to the [facility] complaining of waxing and waning neck pain radiating to her shoulders bilaterally that started a few weeks ago.  She states that the pain will start when she wakes up in the morning.  The patient states that turning her neck aggravates the pain.  The patient lists intermittent numbness and tingling in her pinkies bilaterally.  The patient denies weakness in her arms as an associated symptom.  She states that she has taken Tylenol and Ibuprofen a couple of times a day with mild relief to her symptoms.  She states that she has applied heat to her neck with no relief to her pain.  She denies having an x-ray performed of her neck.  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  The patient states that her PCP is Dr. Jeanie Cooks.  The patient states that her blood pressure is usually high and the bottom number is always high.  She states that the bottom number has been as high as 110.  The patient states that when she is not experiencing neck pain, the bottom number has been in the 70s or 80s.  She states that she has been under a lot of stress as she is working 3 jobs to support herself and her husband.  She denies checking her sugars regularly.  She states that she does not know what her last A1c was and does not remember when the last visit to her doctor was.  She states that she takes Lisinopril but she does not take her Amlodipine or Amaryl regularly.  She is taking Metformin once daily only.   Past Medical History  Diagnosis Date   Hypertension    Diabetes mellitus     A1C over 9     Acid reflux    Obesity    HLD (hyperlipidemia)    Abnormal EKG     LVH with strain   Noncompliance    Past Surgical History  Procedure Laterality Date   Knee arthroscopy w/ meniscal repair Right    History reviewed. No pertinent family history. History   Social History   Marital Status: Married    Spouse Name: N/A    Number of Children: N/A   Years of Education: N/A   Occupational History   Not on file.   Social History Main Topics   Smoking status: Never Smoker    Smokeless tobacco: Not on file   Alcohol Use: No   Drug Use: No   Sexual Activity: Not on file   Other Topics Concern   Not on file   Social History Narrative   No narrative on file   Allergies  Allergen Reactions   Hydrocodone Hypertension   History   Social History   Marital Status: Married    Spouse Name: N/A    Number of Children: N/A   Years of Education: N/A   Occupational History   Not on file.   Social History Main Topics   Smoking status: Never Smoker    Smokeless tobacco: Not on file  Alcohol Use: No   Drug Use: No   Sexual Activity: Not on file   Other Topics Concern   Not on file   Social History Narrative   No narrative on file     Review of Systems  Constitutional: Negative for fever, chills, diaphoresis and fatigue.  Eyes: Negative for visual disturbance.  Respiratory: Negative for shortness of breath.   Cardiovascular: Negative for chest pain, palpitations and leg swelling.  Endocrine: Negative for polydipsia, polyphagia and polyuria.  Musculoskeletal: Positive for neck pain.  Neurological: Positive for numbness (pinkies bilaterally.). Negative for dizziness, tremors, seizures, syncope, facial asymmetry, speech difficulty, weakness, light-headedness and headaches.  All other systems reviewed and are negative.    Objective:  Physical Exam  Nursing note and vitals reviewed. Constitutional: She is oriented to person, place, and  time. She appears well-developed and well-nourished.  HENT:  Head: Normocephalic and atraumatic.  Eyes: Conjunctivae and EOM are normal. Pupils are equal, round, and reactive to light.  Neck: Normal range of motion. Neck supple. No thyromegaly present.  Cardiovascular: Normal rate, regular rhythm and normal heart sounds.  Exam reveals no gallop and no friction rub.   No murmur heard. Pulmonary/Chest: Effort normal and breath sounds normal. No respiratory distress. She has no wheezes. She has no rales. She exhibits no tenderness.  Musculoskeletal: Normal range of motion.       Right shoulder: Normal.       Left shoulder: Normal.       Thoracic back: Normal.  Pain in right trapezius.  No midline tenderness.  Motor 5/5 BUE.  +paraspinal TTP on R.  Full ROM cervical spine in all directions.  Lymphadenopathy:    She has no cervical adenopathy.  Neurological: She is alert and oriented to person, place, and time.  Skin: Skin is warm and dry.  Psychiatric: She has a normal mood and affect. Her behavior is normal.   [facility] preliminary x-ray report read by Dr. Tamala Julian: CERVICAL SPINE FILMS:  [place].  Results for orders placed in visit on 10/16/13  GLUCOSE, POCT (MANUAL RESULT ENTRY)      Result Value Ref Range   POC Glucose 171 (*) 70 - 99 mg/dl  POCT GLYCOSYLATED HEMOGLOBIN (HGB A1C)      Result Value Ref Range   Hemoglobin A1C 8.4      BP 160/100   Pulse 82   Temp(Src) 98.3 F (36.8 C) (Oral)   Resp 16   Ht 5' 5.5" (1.664 m)   Wt 248 lb (112.492 kg)   BMI 40.63 kg/m2   SpO2 98% Assessment & Plan:  Neck pain - Plan: DG Cervical Spine Complete  Type II or unspecified type diabetes mellitus without mention of complication, not stated as uncontrolled - Plan: POCT glucose (manual entry), POCT glycosylated hemoglobin (Hb A1C), Comprehensive metabolic panel  Essential hypertension, benign  1. Neck pain/strain: New. Rx for Mobic, Robaxin provided to take scheduled for 1-2 weeks  and then prn.  Heat to area for 15-20 minutes bid.  Home exercises provided to perform daily.  Avoid heavy lifting.  Call in two weeks if no improvement; will refer to PT and/or ortho. 2.  DMII: uncontrolled; increase Metformin to bid.  Follow-up with PCP. 3. HTN: uncontrolled; restart Almodipine 5mg  daily.  Follow-up with PCP.   Meds ordered this encounter  Medications   methocarbamol (ROBAXIN) 500 MG tablet    Sig: Take 1-2 tablets (500-1,000 mg total) by mouth at bedtime as needed for muscle spasms.  Dispense:  60 tablet    Refill:  0   meloxicam (MOBIC) 15 MG tablet    Sig: Take 1 tablet (15 mg total) by mouth daily.    Dispense:  30 tablet    Refill:  0   I personally performed the services described in this documentation, which was scribed in my presence.  The recorded information has been reviewed and is accurate.  Reginia Forts, M.D.  [facility] [address] [place], [place]  [phone] phone [phone] fax

## 2013-10-16 NOTE — Patient Instructions (Signed)
1. Take Meloxicam one daily for inflammation.  Do not take Ibuprofen while taking Meloxicam. 2. Take Robaxin/Methocarbimol at bedtime for muscle tension.  3.  You can continue Tylenol as needed for pain. 4.  Apply heat to neck for 15-20 minutes twice daily. 5.  Perform neck exercises daily (3 sets of 10). 6.  Increase Metformin to twice daily. 7. Restart Amlodipine for blood pressure.

## 2013-10-17 LAB — COMPREHENSIVE METABOLIC PANEL
ALT: 17 U/L (ref 0–35)
AST: 17 U/L (ref 0–37)
Albumin: 4.5 g/dL (ref 3.5–5.2)
Alkaline Phosphatase: 53 U/L (ref 39–117)
BILIRUBIN TOTAL: 0.5 mg/dL (ref 0.2–1.2)
BUN: 19 mg/dL (ref 6–23)
CALCIUM: 9.8 mg/dL (ref 8.4–10.5)
CHLORIDE: 102 meq/L (ref 96–112)
CO2: 29 meq/L (ref 19–32)
CREATININE: 1 mg/dL (ref 0.50–1.10)
GLUCOSE: 177 mg/dL — AB (ref 70–99)
Potassium: 3.9 mEq/L (ref 3.5–5.3)
Sodium: 139 mEq/L (ref 135–145)
Total Protein: 8.3 g/dL (ref 6.0–8.3)

## 2013-10-18 ENCOUNTER — Encounter: Payer: Self-pay | Admitting: Family Medicine

## 2014-02-26 ENCOUNTER — Encounter (HOSPITAL_COMMUNITY): Payer: Self-pay | Admitting: Emergency Medicine

## 2014-02-26 ENCOUNTER — Emergency Department (HOSPITAL_COMMUNITY)
Admission: EM | Admit: 2014-02-26 | Discharge: 2014-02-26 | Disposition: A | Discharge status: Home / Self Care | Payer: BC Managed Care – PPO | Attending: Emergency Medicine | Admitting: Emergency Medicine

## 2014-02-26 DIAGNOSIS — E119 Type 2 diabetes mellitus without complications: Secondary | ICD-10-CM | POA: Diagnosis not present

## 2014-02-26 DIAGNOSIS — G243 Spasmodic torticollis: Secondary | ICD-10-CM | POA: Insufficient documentation

## 2014-02-26 DIAGNOSIS — Z79899 Other long term (current) drug therapy: Secondary | ICD-10-CM | POA: Insufficient documentation

## 2014-02-26 DIAGNOSIS — R51 Headache: Secondary | ICD-10-CM | POA: Insufficient documentation

## 2014-02-26 DIAGNOSIS — M62838 Other muscle spasm: Secondary | ICD-10-CM | POA: Insufficient documentation

## 2014-02-26 DIAGNOSIS — I10 Essential (primary) hypertension: Secondary | ICD-10-CM | POA: Diagnosis not present

## 2014-02-26 DIAGNOSIS — E669 Obesity, unspecified: Secondary | ICD-10-CM | POA: Insufficient documentation

## 2014-02-26 DIAGNOSIS — Z8719 Personal history of other diseases of the digestive system: Secondary | ICD-10-CM | POA: Insufficient documentation

## 2014-02-26 MED ORDER — DIAZEPAM 2 MG PO TABS
2.0000 mg | ORAL_TABLET | Freq: Once | ORAL | Status: AC
  Administered 2014-02-26: 2 mg via ORAL
  Filled 2014-02-26: qty 1

## 2014-02-26 MED ORDER — METHOCARBAMOL 750 MG PO TABS: 750.0000 mg | ORAL_TABLET | Freq: Four times a day (QID) | ORAL | Status: DC

## 2014-02-26 MED ORDER — MELOXICAM 15 MG PO TABS: 15.0000 mg | ORAL_TABLET | Freq: Every day | ORAL | Status: DC

## 2014-02-26 MED ORDER — OXYCODONE-ACETAMINOPHEN 5-325 MG PO TABS: 2.0000 | ORAL_TABLET | ORAL | Status: DC | PRN

## 2014-02-26 MED ORDER — METHOCARBAMOL 500 MG PO TABS
1000.0000 mg | ORAL_TABLET | Freq: Once | ORAL | Status: AC
  Administered 2014-02-26: 1000 mg via ORAL
  Filled 2014-02-26: qty 2

## 2014-02-26 MED ORDER — HYDROMORPHONE HCL PF 1 MG/ML IJ SOLN
2.0000 mg | Freq: Once | INTRAMUSCULAR | Status: AC
Start: 2014-02-26 — End: 2014-02-26
  Administered 2014-02-26: 2 mg via INTRAMUSCULAR
  Filled 2014-02-26: qty 2

## 2014-02-26 NOTE — ED Notes (Signed)
Reports muscle spasm in left side of neck and face and head, sts seen pmd for same with muscle relaxer and tramadol and no relief with either med, pt has neck brace from hx and sts it isnt helping either.

## 2014-02-26 NOTE — Discharge Instructions (Signed)
Heat Therapy Heat therapy can help ease sore, stiff, injured, and tight muscles and joints. Heat relaxes your muscles, which may help ease your pain.  RISKS AND COMPLICATIONS If you have any of the following conditions, do not use heat therapy unless your health care provider has approved:  Poor circulation.  Healing wounds or scarred skin in the area being treated.  Diabetes, heart disease, or high blood pressure.  Not being able to feel (numbness) the area being treated.  Unusual swelling of the area being treated.  Active infections.  Blood clots.  Cancer.  Inability to communicate pain. This may include young children and people who have problems with their brain function (dementia).  Pregnancy. Heat therapy should only be used on old, pre-existing, or long-lasting (chronic) injuries. Do not use heat therapy on new injuries unless directed by your health care provider. HOW TO USE HEAT THERAPY There are several different kinds of heat therapy, including:  Moist heat pack.  Warm water bath.  Hot water bottle.  Electric heating pad.  Heated gel pack.  Heated wrap.  Electric heating pad. Use the heat therapy method suggested by your health care provider. Follow your health care provider's instructions on when and how to use heat therapy. GENERAL HEAT THERAPY RECOMMENDATIONS  Do not sleep while using heat therapy. Only use heat therapy while you are awake.  Your skin may turn pink while using heat therapy. Do not use heat therapy if your skin turns red.  Do not use heat therapy if you have new pain.  High heat or long exposure to heat can cause burns. Be careful when using heat therapy to avoid burning your skin.  Do not use heat therapy on areas of your skin that are already irritated, such as with a rash or sunburn. SEEK MEDICAL CARE IF:  You have blisters, redness, swelling, or numbness.  You have new pain.  Your pain is worse. MAKE SURE  YOU:  Understand these instructions.  Will watch your condition.  Will get help right away if you are not doing well or get worse. Document Released: 09/13/2011 Document Revised: 11/05/2013 Document Reviewed: 08/14/2013 [facility] Patient Information 2015 [place], [place]. This information is not intended to replace advice given to you by your health care provider. Make sure you discuss any questions you have with your health care provider.  Muscle Cramps and Spasms Muscle cramps and spasms occur when a muscle or muscles tighten and you have no control over this tightening (involuntary muscle contraction). They are a common problem and can develop in any muscle. The most common place is in the calf muscles of the leg. Both muscle cramps and muscle spasms are involuntary muscle contractions, but they also have differences:   Muscle cramps are sporadic and painful. They may last a few seconds to a quarter of an hour. Muscle cramps are often more forceful and last longer than muscle spasms.  Muscle spasms may or may not be painful. They may also last just a few seconds or much longer. CAUSES  It is uncommon for cramps or spasms to be due to a serious underlying problem. In many cases, the cause of cramps or spasms is unknown. Some common causes are:   Overexertion.   Overuse from repetitive motions (doing the same thing over and over).   Remaining in a certain position for a long period of time.   Improper preparation, form, or technique while performing a sport or activity.   Dehydration.   Injury.  Side effects of some medicines.   Abnormally low levels of the salts and ions in your blood (electrolytes), especially potassium and calcium. This could happen if you are taking water pills (diuretics) or you are pregnant.  Some underlying medical problems can make it more likely to develop cramps or spasms. These include, but are not limited to:   Diabetes.   Parkinson disease.    Hormone disorders, such as thyroid problems.   Alcohol abuse.   Diseases specific to muscles, joints, and bones.   Blood vessel disease where not enough blood is getting to the muscles.  HOME CARE INSTRUCTIONS   Stay well hydrated. Drink enough water and fluids to keep your urine clear or pale yellow.  It may be helpful to massage, stretch, and relax the affected muscle.  For tight or tense muscles, use a warm towel, heating pad, or hot shower water directed to the affected area.  If you are sore or have pain after a cramp or spasm, applying ice to the affected area may relieve discomfort.  Put ice in a plastic bag.  Place a towel between your skin and the bag.  Leave the ice on for 15-20 minutes, 03-04 times a day.  Medicines used to treat a known cause of cramps or spasms may help reduce their frequency or severity. Only take over-the-counter or prescription medicines as directed by your caregiver. SEEK MEDICAL CARE IF:  Your cramps or spasms get more severe, more frequent, or do not improve over time.  MAKE SURE YOU:   Understand these instructions.  Will watch your condition.  Will get help right away if you are not doing well or get worse. Document Released: 12/11/2001 Document Revised: 10/16/2012 Document Reviewed: 06/07/2012 [facility] Patient Information 2015 [place], [place]. This information is not intended to replace advice given to you by your health care provider. Make sure you discuss any questions you have with your health care provider.  Torticollis, Acute You have suddenly (acutely) developed a twisted neck (torticollis). This is usually a self-limited condition. CAUSES  Acute torticollis may be caused by malposition, trauma or infection. Most commonly, acute torticollis is caused by sleeping in an awkward position. Torticollis may also be caused by the flexion, extension or twisting of the neck muscles beyond their normal position. Sometimes, the exact  cause may not be known. SYMPTOMS  Usually, there is pain and limited movement of the neck. Your neck may twist to one side. DIAGNOSIS  The diagnosis is often made by physical examination. X-rays, CT scans or MRIs may be done if there is a history of trauma or concern of infection. TREATMENT  For a common, stiff neck that develops during sleep, treatment is focused on relaxing the contracted neck muscle. Medications (including shots) may be used to treat the problem. Most cases resolve in several days. Torticollis usually responds to conservative physical therapy. If left untreated, the shortened and spastic neck muscle can cause deformities in the face and neck. Rarely, surgery is required. HOME CARE INSTRUCTIONS   Use over-the-counter and prescription medications as directed by your caregiver.  Do stretching exercises and massage the neck as directed by your caregiver.  Follow up with physical therapy if needed and as directed by your caregiver. SEEK IMMEDIATE MEDICAL CARE IF:   You develop difficulty breathing or noisy breathing (stridor).  You drool, develop trouble swallowing or have pain with swallowing.  You develop numbness or weakness in the hands or feet.  You have changes in speech or  vision.  You have problems with urination or bowel movements.  You have difficulty walking.  You have a fever.  You have increased pain. MAKE SURE YOU:   Understand these instructions.  Will watch your condition.  Will get help right away if you are not doing well or get worse. Document Released: 06/18/2000 Document Revised: 09/13/2011 Document Reviewed: 07/30/2009 [facility] Patient Information 2015 [place], [place]. This information is not intended to replace advice given to you by your health care provider. Make sure you discuss any questions you have with your health care provider.

## 2014-02-26 NOTE — ED Provider Notes (Signed)
CSN: [id number]     Arrival date & time 02/26/14  0226 History   First MD Initiated Contact with Patient 02/26/14 0535     Chief Complaint  Patient presents with  . Facial Pain     (Consider location/radiation/quality/duration/timing/severity/associated sxs/prior Treatment) HPI 53 year old female presents to emergency apartment from home with complaint of left-sided neck and shoulder pain.  Symptoms have been ongoing for the last 4 days.  Patient reports she is unable to turn her head fully to the left.  She denies any known trauma.  Symptoms began after waking 4 days ago.  She has history of same.  She has some tingling in her left hand.  She reports increased pain with lifting her arm above her head.  Patient has been doing gentle stretching and massage at home without improvement.  Patient was seen yesterday by her doctor and started on Ultram and Flexeril.  She reports no improvement with these medications. Past Medical History  Diagnosis Date  . Hypertension   . Diabetes mellitus     A1C over 9  . Acid reflux   . Obesity   . HLD (hyperlipidemia)   . Abnormal EKG     LVH with strain  . Noncompliance    Past Surgical History  Procedure Laterality Date  . Knee arthroscopy w/ meniscal repair Right    No family history on file. History  Substance Use Topics  . Smoking status: Never Smoker   . Smokeless tobacco: Not on file  . Alcohol Use: No   OB History   Grav Para Term Preterm Abortions TAB SAB Ect Mult Living                 Review of Systems  See History of Present Illness; otherwise all other systems are reviewed and negative   Allergies  Hydrocodone  Home Medications   Prior to Admission medications   Medication Sig Start Date End Date Taking? Authorizing Provider  albuterol (PROVENTIL HFA;VENTOLIN HFA) 108 (90 BASE) MCG/ACT inhaler Inhale 2 puffs into the lungs every 6 (six) hours as needed for wheezing or shortness of breath. 06/14/13   Gregor Hams, MD   amLODipine (NORVASC) 5 MG tablet Take 5 mg by mouth daily.    Historical Provider, MD  glimepiride (AMARYL) 2 MG tablet Take 2 mg by mouth daily before breakfast.    Historical Provider, MD  guaiFENesin-codeine 100-10 MG/5ML syrup Take 5 mLs by mouth at bedtime as needed for cough. 06/14/13   Gregor Hams, MD  ipratropium (ATROVENT) 0.06 % nasal spray Place 2 sprays into both nostrils 4 (four) times daily. 06/14/13   Gregor Hams, MD  meloxicam (MOBIC) 15 MG tablet Take 1 tablet (15 mg total) by mouth daily. 10/16/13   Wardell Honour, MD  metFORMIN (GLUCOPHAGE) 500 MG tablet Take 500 mg by mouth 2 (two) times daily with a meal.    Historical Provider, MD  methocarbamol (ROBAXIN) 500 MG tablet Take 1-2 tablets (500-1,000 mg total) by mouth at bedtime as needed for muscle spasms. 10/16/13   Wardell Honour, MD  olmesartan-hydrochlorothiazide (BENICAR HCT) 40-25 MG per tablet Take 1 tablet by mouth daily.    Historical Provider, MD   BP 146/87  Pulse 76  Temp(Src) 98.2 F (36.8 C) (Oral)  Resp 18  Ht 5\' 6"  (1.676 m)  Wt 248 lb (112.492 kg)  BMI 40.05 kg/m2  SpO2 99% Physical Exam  Nursing note and vitals reviewed. Constitutional: She is oriented to  person, place, and time. She appears well-developed and well-nourished.  HENT:  Head: Normocephalic and atraumatic.  Nose: Nose normal.  Mouth/Throat: Oropharynx is clear and moist.  Eyes: Conjunctivae and EOM are normal. Pupils are equal, round, and reactive to light.  Neck: Normal range of motion. Neck supple. No JVD present. No tracheal deviation present. No thyromegaly present.  Patient has spasm of the left trapezius, left spinal accessory and paraspinal muscles.  Tenderness with palpation of these areas.  Patient able to flex and extend neck without numbness tingling or weakness.  She is able to turn her head to the right easily.  Patient is only able to turn a few degrees off midline to the left before she has severe pain.  Cardiovascular:  Normal rate, regular rhythm, normal heart sounds and intact distal pulses.  Exam reveals no gallop and no friction rub.   No murmur heard. Pulmonary/Chest: Effort normal and breath sounds normal. No stridor. No respiratory distress. She has no wheezes. She has no rales. She exhibits no tenderness.  Abdominal: Soft. Bowel sounds are normal. She exhibits no distension and no mass. There is no tenderness. There is no rebound and no guarding.  Musculoskeletal: Normal range of motion. She exhibits no edema and no tenderness.  Lymphadenopathy:    She has no cervical adenopathy.  Neurological: She is alert and oriented to person, place, and time. She has normal reflexes. No cranial nerve deficit. She exhibits normal muscle tone. Coordination normal.  Skin: Skin is warm and dry. No rash noted. No erythema. No pallor.  Psychiatric: She has a normal mood and affect. Her behavior is normal. Judgment and thought content normal.    ED Course  Procedures (including critical care time) Labs Review Labs Reviewed - No data to display  Imaging Review No results found.   EKG Interpretation None      MDM   Final diagnoses:  Torticollis, spasmodic  Muscle spasms of neck    53 year old female with left-sided neck pain, suspect muscle spasm.  Patient has not had relief from her current regimen of tramadol and Flexeril.  Plan to increase pain and spasm medications.  Once more comfortable we'll discharge home to followup with primary care physician/sports medicine.     Kalman Drape, MD 02/26/14 0630

## 2014-02-26 NOTE — ED Notes (Signed)
Pt requesting pillow, informed pt I would look for pillow and pt rolled her eyes at me. i again acknowledged pt request and informed her I would attempt to find a pillow.

## 2014-02-26 NOTE — ED Notes (Addendum)
Per Patient: Pt reports Left sided head and neck pain. Pt denies injury or trauma. Pt has difficulty rotating neck. Pt was seen PCP today and was prescribed a muscle relaxer and Tramadol, patient has been taking these medication without relief. Ax4, NAD.

## 2014-02-28 ENCOUNTER — Encounter: Payer: Self-pay | Admitting: Family Medicine

## 2014-02-28 ENCOUNTER — Ambulatory Visit (INDEPENDENT_AMBULATORY_CARE_PROVIDER_SITE_OTHER): Payer: BC Managed Care – PPO | Admitting: Family Medicine

## 2014-02-28 VITALS — BP 151/108 | HR 88 | Ht 66.0 in | Wt 248.0 lb

## 2014-02-28 DIAGNOSIS — M542 Cervicalgia: Secondary | ICD-10-CM

## 2014-02-28 DIAGNOSIS — S139XXA Sprain of joints and ligaments of unspecified parts of neck, initial encounter: Secondary | ICD-10-CM

## 2014-02-28 DIAGNOSIS — S161XXA Strain of muscle, fascia and tendon at neck level, initial encounter: Secondary | ICD-10-CM

## 2014-02-28 MED ORDER — OXYCODONE-ACETAMINOPHEN 5-325 MG PO TABS: 1.0000 | ORAL_TABLET | Freq: Four times a day (QID) | ORAL | Status: DC | PRN

## 2014-02-28 MED ORDER — METHOCARBAMOL 750 MG PO TABS: 750.0000 mg | ORAL_TABLET | Freq: Four times a day (QID) | ORAL | Status: DC

## 2014-02-28 MED ORDER — PREDNISONE (PAK) 10 MG PO TABS: ORAL_TABLET | ORAL | Status: DC

## 2014-02-28 NOTE — Patient Instructions (Addendum)
You have severe trapezius/cervical spasms. Prednisone 6 day dose pack to relieve irritation/inflammation. Robaxin 3-4 times a day as needed for muscle spasms (can make you sleepy - if so do not drive while taking this). Percocet for severe pain (no driving on this medicine). Consider cervical collar if severely painful. Simple range of motion exercises within limits of pain to prevent further stiffness. Start physical therapy for stretching, exercises, traction, and modalities. Heat 15 minutes at a time 3-4 times a day to help with spasms. Watch head position when on computers, texting, when sleeping in bed - should in line with back to prevent further nerve traction and irritation. Consider home traction unit if you get benefit with this in physical therapy. Follow up with me in 4 weeks for reevaluation.

## 2014-03-01 ENCOUNTER — Encounter: Payer: Self-pay | Admitting: Family Medicine

## 2014-03-01 ENCOUNTER — Ambulatory Visit: Payer: BC Managed Care – PPO | Admitting: Family Medicine

## 2014-03-01 DIAGNOSIS — M542 Cervicalgia: Secondary | ICD-10-CM | POA: Insufficient documentation

## 2014-03-01 NOTE — Assessment & Plan Note (Signed)
2/2 severe trapezius/cervical spasms.  Start with prednisone dose pack, robaxin and percocet as needed.  Has a cervical collar - use as needed.  Start PT and ROM exercises.  Heat for spasms.  Ergonomics discussed.  F/u in 4 weeks.

## 2014-03-01 NOTE — Progress Notes (Signed)
Patient ID: Shannon Oliver, female   DOB: [birth year], 53 y.o.   MRN: [id number]  PCP: Cloyd Stagers, MD  Subjective:   HPI: Patient is a 53 y.o. female here for neck pain.  Patient reports she had similar problems back in April that improved. Current issues started 1 week ago. Unsure if she slept wrong to start this. Pain severe with turning head side to side. Left seems worse than right. Radiographs in April showed only mild C5-6 facet arthropathy. Taking percocet, robaxin which have helped.  Not taking nsaid because increases blood pressure. No bowel/bladder dysfunction. No radiation into arms though gets waves of pain in shoulders in evenings.  Past Medical History  Diagnosis Date  . Hypertension   . Diabetes mellitus     A1C over 9  . Acid reflux   . Obesity   . HLD (hyperlipidemia)   . Abnormal EKG     LVH with strain  . Noncompliance     Current Outpatient Prescriptions on File Prior to Visit  Medication Sig Dispense Refill  . amLODipine (NORVASC) 5 MG tablet Take 5 mg by mouth daily.      Marland Kitchen glimepiride (AMARYL) 2 MG tablet Take 2 mg by mouth daily before breakfast.      . losartan-hydrochlorothiazide (HYZAAR) 100-25 MG per tablet Take 1 tablet by mouth daily.      . metFORMIN (GLUCOPHAGE) 500 MG tablet Take 500 mg by mouth 2 (two) times daily with a meal.      . metoprolol tartrate (LOPRESSOR) 25 MG tablet Take 25 mg by mouth 2 (two) times daily.      . Multiple Vitamins-Calcium (ONE-A-DAY WOMENS PO) Take 1 tablet by mouth daily.      . simvastatin (ZOCOR) 10 MG tablet Take 10 mg by mouth daily.       No current facility-administered medications on file prior to visit.    Past Surgical History  Procedure Laterality Date  . Knee arthroscopy w/ meniscal repair Right     Allergies  Allergen Reactions  . Hydrocodone Hypertension    History   Social History  . Marital Status: Married    Spouse Name: N/A    Number of Children: N/A  . Years  of Education: N/A   Occupational History  . Not on file.   Social History Main Topics  . Smoking status: Never Smoker   . Smokeless tobacco: Not on file  . Alcohol Use: No  . Drug Use: No  . Sexual Activity: Not on file   Other Topics Concern  . Not on file   Social History Narrative  . No narrative on file    No family history on file.  BP 151/108  Pulse 88  Ht 5\' 6"  (1.676 m)  Wt 248 lb (112.492 kg)  BMI 40.05 kg/m2  Review of Systems: See HPI above.    Objective:  Physical Exam:  Gen: NAD  Neck: No gross deformity, swelling, bruising.  Spasms bilateral paraspinal regions. TTP bilateral cervical paraspinal muscles.  No midline/bony TTP. Full flexion.  15 degrees extension.  10 degrees right lateral rotation, 20 degrees left - both painful. BUE strength 5/5.   Sensation intact to light touch.   2+ equal reflexes in triceps, biceps, brachioradialis tendons. Negative spurlings. NV intact distal BUEs.    Assessment & Plan:  1. Neck pain - 2/2 severe trapezius/cervical spasms.  Start with prednisone dose pack, robaxin and percocet as needed.  Has a cervical collar - use  as needed.  Start PT and ROM exercises.  Heat for spasms.  Ergonomics discussed.  F/u in 4 weeks.

## 2014-03-18 ENCOUNTER — Encounter: Payer: Self-pay | Admitting: *Deleted

## 2014-03-28 ENCOUNTER — Ambulatory Visit: Payer: BC Managed Care – PPO | Admitting: Family Medicine

## 2014-06-18 ENCOUNTER — Emergency Department (HOSPITAL_COMMUNITY)
Admission: EM | Admit: 2014-06-18 | Discharge: 2014-06-18 | Disposition: A | Discharge status: Home / Self Care | Payer: BC Managed Care – PPO | Source: Home / Self Care | Attending: Family Medicine | Admitting: Family Medicine

## 2014-06-18 ENCOUNTER — Encounter (HOSPITAL_COMMUNITY): Payer: Self-pay | Admitting: Emergency Medicine

## 2014-06-18 DIAGNOSIS — H109 Unspecified conjunctivitis: Secondary | ICD-10-CM

## 2014-06-18 DIAGNOSIS — H00013 Hordeolum externum right eye, unspecified eyelid: Secondary | ICD-10-CM

## 2014-06-18 MED ORDER — ERYTHROMYCIN 5 MG/GM OP OINT: TOPICAL_OINTMENT | OPHTHALMIC | Status: DC

## 2014-06-18 NOTE — Discharge Instructions (Signed)

## 2014-06-18 NOTE — ED Notes (Signed)
Right eye itching and crusty drainage in am, painful and swelling of lower lid .  Has been using warm compresses, but left eye is starting to itch.

## 2014-06-18 NOTE — ED Provider Notes (Signed)
CSN: [id number]     Arrival date & time 06/18/14  1524 History   First MD Initiated Contact with Patient 06/18/14 1529     Chief Complaint  Patient presents with  . Stye   (Consider location/radiation/quality/duration/timing/severity/associated sxs/prior Treatment) HPI Comments: 53 year old morbidly obese female with hypertension and type 2 diabetes mellitus presents with the complaint of right eye soreness and pain starting about one week ago. She states there is draining critically overnight and crusting in the morning. She points to a pustule to the lower [place] and, in her canthus of the right eye. She also states there is some itching and redness to the left eye. She has been applying warm compresses which helps with comfort but has not resolved the pustule.   Past Medical History  Diagnosis Date  . Hypertension   . Diabetes mellitus     A1C over 9  . Acid reflux   . Obesity   . HLD (hyperlipidemia)   . Abnormal EKG     LVH with strain  . Noncompliance    Past Surgical History  Procedure Laterality Date  . Knee arthroscopy w/ meniscal repair Right    No family history on file. History  Substance Use Topics  . Smoking status: Never Smoker   . Smokeless tobacco: Not on file  . Alcohol Use: No   OB History    No data available     Review of Systems  Constitutional: Negative.   HENT: Negative.   Eyes: Positive for pain, discharge and itching. Negative for photophobia, redness and visual disturbance.    Allergies  Hydrocodone  Home Medications   Prior to Admission medications   Medication Sig Start Date End Date Taking? Authorizing Provider  amLODipine (NORVASC) 5 MG tablet Take 5 mg by mouth daily.    Historical Provider, MD  erythromycin ophthalmic ointment Place a 1/2 inch ribbon of ointment into the lower lids of both eyes qid 06/18/14   Janne Napoleon, NP  glimepiride (AMARYL) 2 MG tablet Take 2 mg by mouth daily before breakfast.    Historical Provider, MD   losartan-hydrochlorothiazide (HYZAAR) 100-25 MG per tablet Take 1 tablet by mouth daily.    Historical Provider, MD  metFORMIN (GLUCOPHAGE) 500 MG tablet Take 500 mg by mouth 2 (two) times daily with a meal.    Historical Provider, MD  methocarbamol (ROBAXIN-750) 750 MG tablet Take 1 tablet (750 mg total) by mouth 4 (four) times daily. 02/28/14   Dene Gentry, MD  metoprolol tartrate (LOPRESSOR) 25 MG tablet Take 25 mg by mouth 2 (two) times daily.    Historical Provider, MD  Multiple Vitamins-Calcium (ONE-A-DAY WOMENS PO) Take 1 tablet by mouth daily.    Historical Provider, MD  oxyCODONE-acetaminophen (PERCOCET/ROXICET) 5-325 MG per tablet Take 1 tablet by mouth every 6 (six) hours as needed for severe pain. 02/28/14   Dene Gentry, MD  predniSONE (STERAPRED UNI-PAK) 10 MG tablet 6 tabs po day 1, 5 tabs po day 2, 4 tabs po day 3, 3 tabs po day 4, 2 tabs po day 5, 1 tab po day 6 02/28/14   Dene Gentry, MD  simvastatin (ZOCOR) 10 MG tablet Take 10 mg by mouth daily.    Historical Provider, MD   BP 145/69 mmHg  Pulse 93  Temp(Src) 98.3 F (36.8 C) (Oral)  Resp 16  SpO2 100% Physical Exam  Constitutional: She is oriented to person, place, and time. She appears well-developed and well-nourished. No distress.  Eyes:  EOM are normal. Pupils are equal, round, and reactive to light.  Right lower lid with swelling, minor conjunctival erythema and a pustule towards the inner canthus. Left with minor lower conjunctiva erythema otherwise normal.  Neck: Normal range of motion. Neck supple.  Pulmonary/Chest: Effort normal. No respiratory distress.  Neurological: She is alert and oriented to person, place, and time.  Skin: Skin is warm and dry.  Psychiatric: She has a normal mood and affect.  Nursing note and vitals reviewed.   ED Course  Procedures (including critical care time) Labs Review Labs Reviewed - No data to display  Imaging Review No results found.   MDM   1. Hordeolum  external, right   2. Bilateral conjunctivitis    See Dr. Katy Fitch if not resolving, call for appt] Erythromycin oint as dir Warm compresses     Janne Napoleon, NP 06/18/14 1601

## 2014-12-24 ENCOUNTER — Other Ambulatory Visit (HOSPITAL_COMMUNITY)
Admission: RE | Admit: 2014-12-24 | Discharge: 2014-12-24 | Disposition: A | Discharge status: Home / Self Care | Payer: BLUE CROSS/BLUE SHIELD | Source: Ambulatory Visit | Attending: Internal Medicine | Admitting: Internal Medicine

## 2014-12-24 DIAGNOSIS — Z1151 Encounter for screening for human papillomavirus (HPV): Secondary | ICD-10-CM | POA: Diagnosis present

## 2014-12-24 DIAGNOSIS — Z01419 Encounter for gynecological examination (general) (routine) without abnormal findings: Secondary | ICD-10-CM | POA: Diagnosis present

## 2014-12-30 ENCOUNTER — Other Ambulatory Visit: Payer: Self-pay

## 2015-02-06 ENCOUNTER — Other Ambulatory Visit: Payer: Self-pay | Admitting: Gastroenterology

## 2015-02-19 ENCOUNTER — Ambulatory Visit
Admission: RE | Admit: 2015-02-19 | Discharge: 2015-02-19 | Disposition: A | Discharge status: Home / Self Care | Payer: BLUE CROSS/BLUE SHIELD | Source: Ambulatory Visit | Attending: Internal Medicine | Admitting: Internal Medicine

## 2015-02-19 ENCOUNTER — Other Ambulatory Visit: Payer: Self-pay | Admitting: Internal Medicine

## 2015-02-19 DIAGNOSIS — R079 Chest pain, unspecified: Secondary | ICD-10-CM

## 2015-02-19 IMAGING — CR DG CHEST 2V
2 series · 2 of 2 positions shown · non-contrast
Comparison: [DATE]

CLINICAL DATA: Chest pain

EXAM:
CHEST  2 VIEW

[w chest pa]
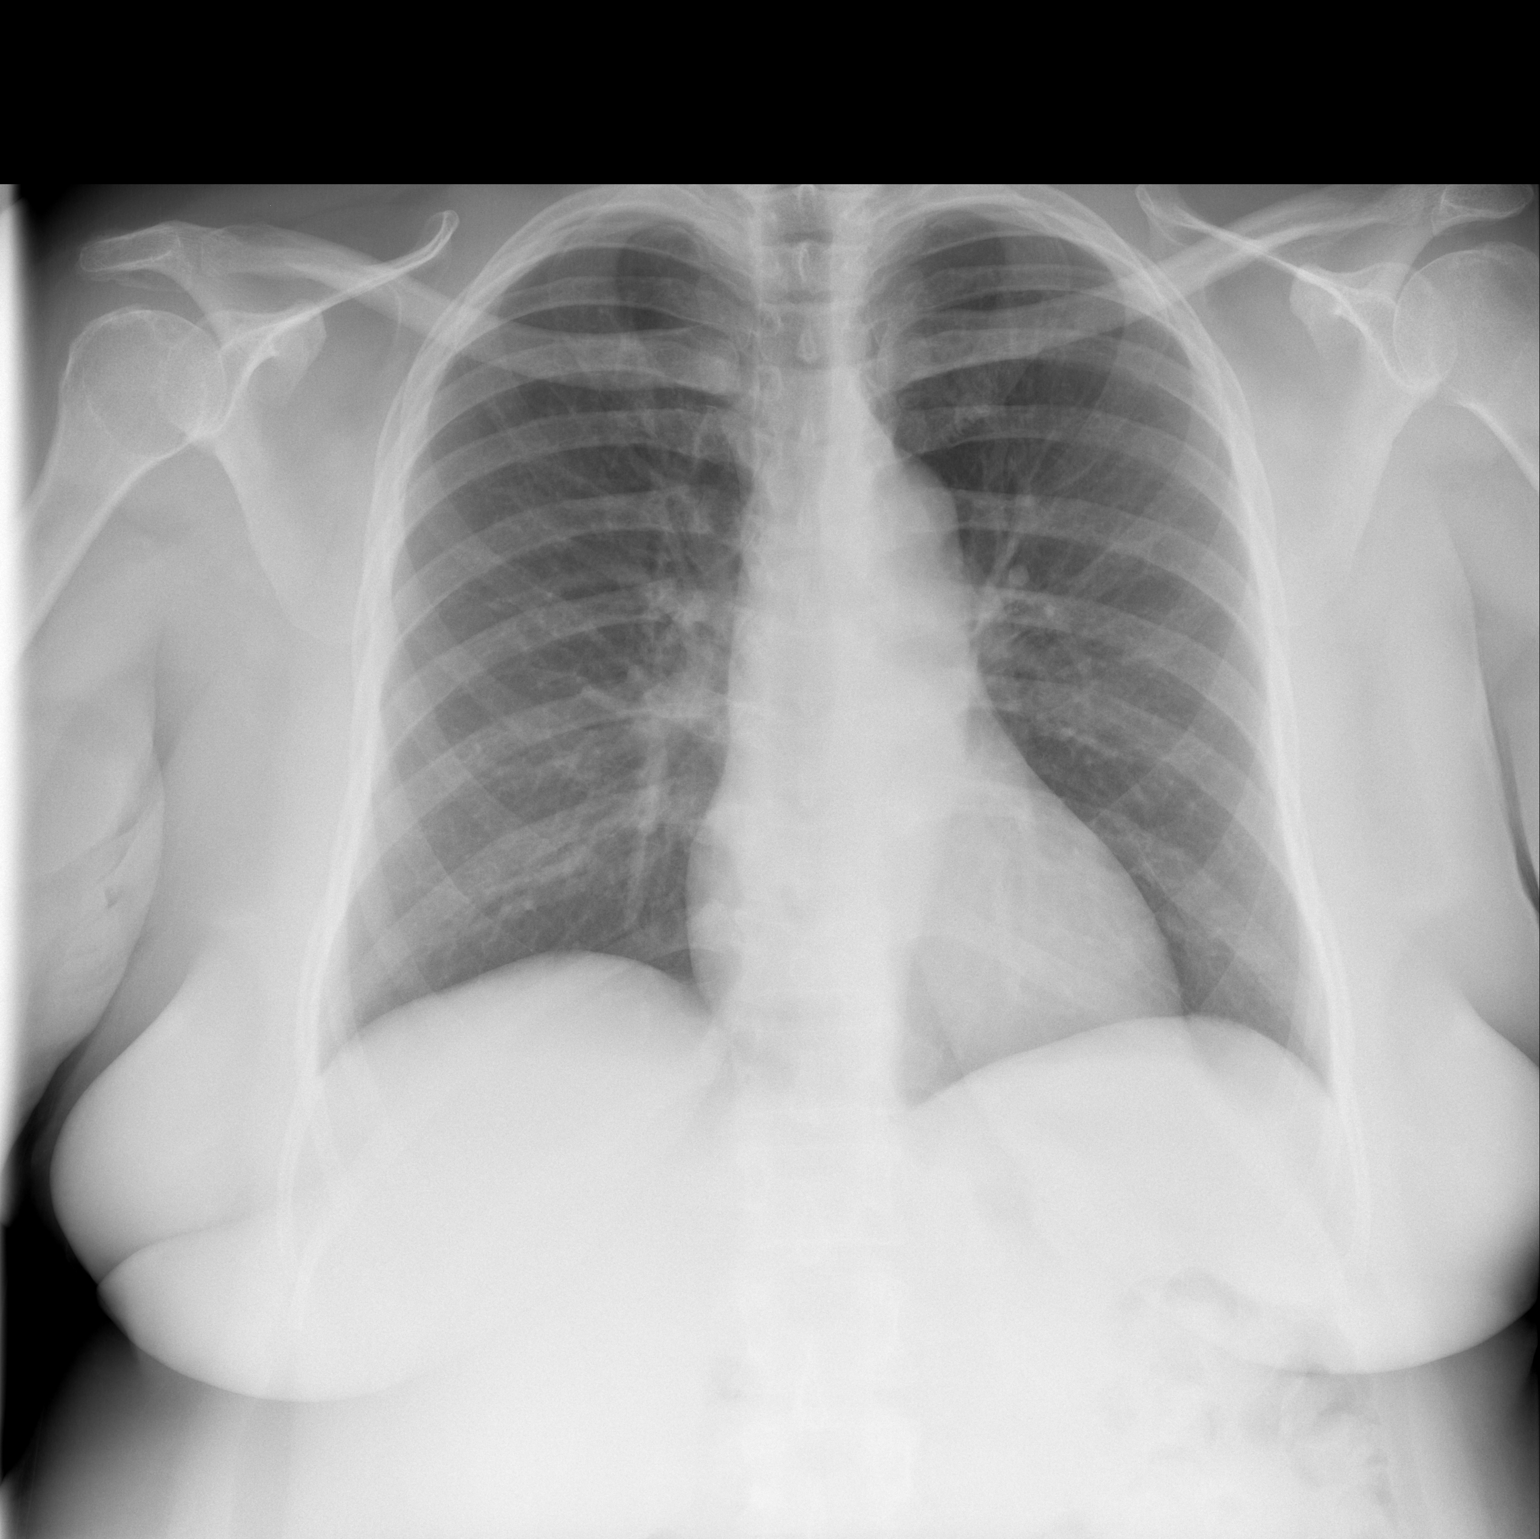

[w chest lat]
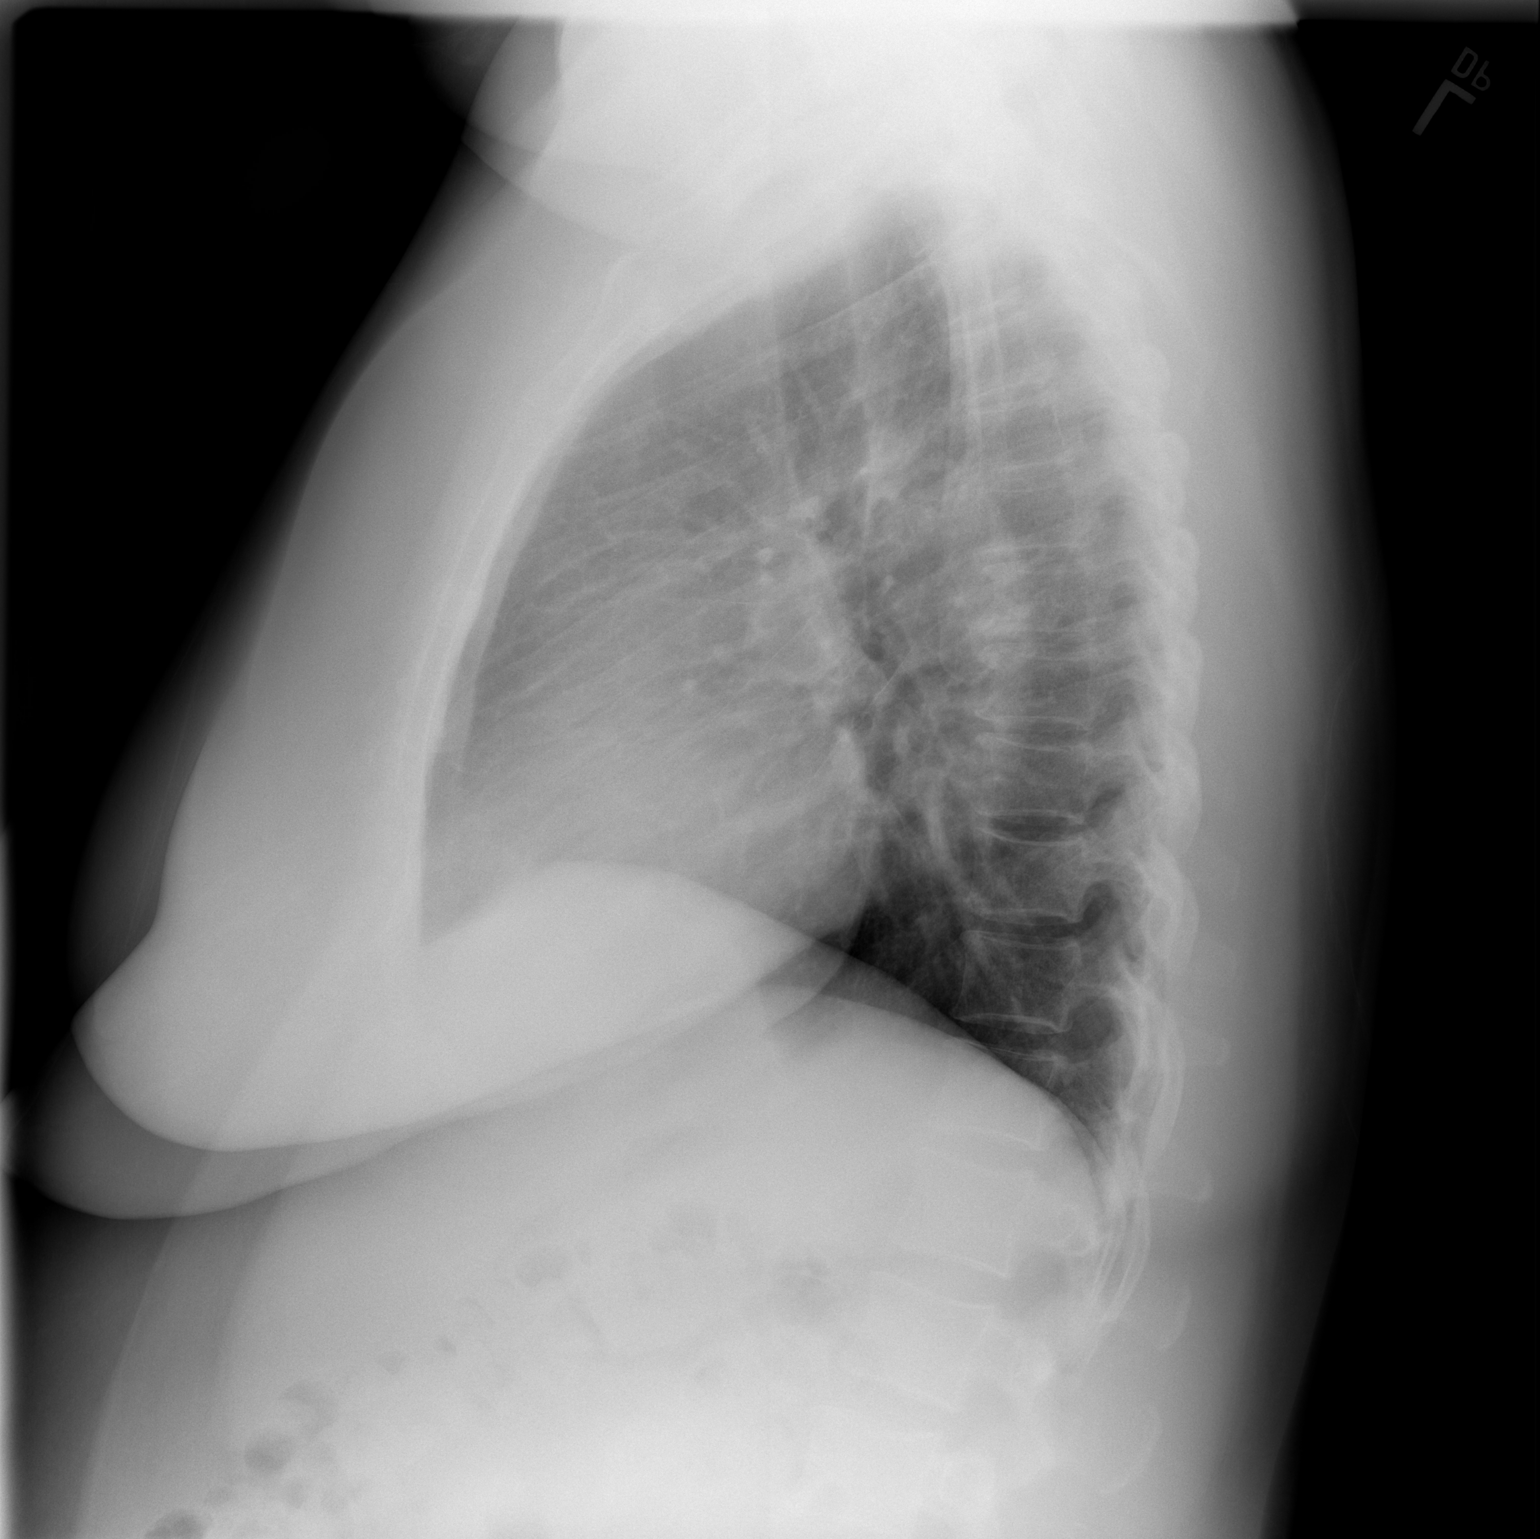

[2 of 2 positions shown; findings below may reference images not displayed]

FINDINGS: Lungs are clear. Heart size and pulmonary vascularity are normal. No
adenopathy. No bone lesions. No pneumothorax.
IMPRESSION: No abnormality noted.

## 2015-04-21 ENCOUNTER — Encounter (HOSPITAL_COMMUNITY): Payer: Self-pay | Admitting: *Deleted

## 2015-04-29 ENCOUNTER — Ambulatory Visit (HOSPITAL_COMMUNITY): Payer: BLUE CROSS/BLUE SHIELD | Admitting: Certified Registered Nurse Anesthetist

## 2015-04-29 ENCOUNTER — Encounter (HOSPITAL_COMMUNITY): Payer: Self-pay | Admitting: *Deleted

## 2015-04-29 ENCOUNTER — Encounter (HOSPITAL_COMMUNITY)
Admission: RE | Disposition: A | Discharge status: Home / Self Care | Payer: Self-pay | Source: Ambulatory Visit | Attending: Gastroenterology

## 2015-04-29 ENCOUNTER — Ambulatory Visit (HOSPITAL_COMMUNITY)
Admission: RE | Admit: 2015-04-29 | Discharge: 2015-04-29 | Disposition: A | Discharge status: Home / Self Care | Payer: BLUE CROSS/BLUE SHIELD | Source: Ambulatory Visit | Attending: Gastroenterology | Admitting: Gastroenterology

## 2015-04-29 DIAGNOSIS — K219 Gastro-esophageal reflux disease without esophagitis: Secondary | ICD-10-CM | POA: Diagnosis not present

## 2015-04-29 DIAGNOSIS — Z7984 Long term (current) use of oral hypoglycemic drugs: Secondary | ICD-10-CM | POA: Diagnosis not present

## 2015-04-29 DIAGNOSIS — Z6841 Body Mass Index (BMI) 40.0 and over, adult: Secondary | ICD-10-CM | POA: Insufficient documentation

## 2015-04-29 DIAGNOSIS — Z1211 Encounter for screening for malignant neoplasm of colon: Secondary | ICD-10-CM | POA: Diagnosis present

## 2015-04-29 DIAGNOSIS — E119 Type 2 diabetes mellitus without complications: Secondary | ICD-10-CM | POA: Diagnosis not present

## 2015-04-29 DIAGNOSIS — E7800 Pure hypercholesterolemia, unspecified: Secondary | ICD-10-CM | POA: Diagnosis not present

## 2015-04-29 DIAGNOSIS — I10 Essential (primary) hypertension: Secondary | ICD-10-CM | POA: Insufficient documentation

## 2015-04-29 HISTORY — PX: COLONOSCOPY WITH PROPOFOL: SHX5780

## 2015-04-29 LAB — GLUCOSE, CAPILLARY: Glucose-Capillary: 173 mg/dL — ABNORMAL HIGH (ref 65–99)

## 2015-04-29 SURGERY — COLONOSCOPY WITH PROPOFOL
Anesthesia: Monitor Anesthesia Care

## 2015-04-29 MED ORDER — PROPOFOL 10 MG/ML IV BOLUS
INTRAVENOUS | Status: AC
  Filled 2015-04-29: qty 20

## 2015-04-29 MED ORDER — LACTATED RINGERS IV SOLN
INTRAVENOUS | Status: DC
  Administered 2015-04-29: 1000 mL via INTRAVENOUS

## 2015-04-29 MED ORDER — PROPOFOL 10 MG/ML IV BOLUS
INTRAVENOUS | Status: DC | PRN
  Administered 2015-04-29: 20 mg via INTRAVENOUS
  Administered 2015-04-29: 40 mg via INTRAVENOUS
  Administered 2015-04-29: 20 mg via INTRAVENOUS
  Administered 2015-04-29: 70 mg via INTRAVENOUS
  Administered 2015-04-29: 30 mg via INTRAVENOUS

## 2015-04-29 MED ORDER — SODIUM CHLORIDE 0.9 % IV SOLN: INTRAVENOUS | Status: DC

## 2015-04-29 SURGICAL SUPPLY — 22 items

## 2015-04-29 NOTE — Discharge Instructions (Signed)

## 2015-04-29 NOTE — H&P (Signed)
  Procedure: Baseline screening colonoscopy. No family history of colon cancer. Mother diagnosed with pancreatic cancer.  History: The patient is a 54 year old female born 1960-12-20. She is scheduled to undergo her first screening colonoscopy with polypectomy to prevent colon cancer.  Medication allergies: None  Past medical history: Type 2 diabetes mellitus. Hypertension. Hypercholesterolemia. 2014 cardiac echo showed a left ventricular ejection fraction 55% and left ventricular hypertrophy. Plantar fasciitis. Knee surgery for torn meniscus  Exam: The patient is alert and lying comfortably on the endoscopy stretcher. Abdomen is soft and nontender to palpation. Cardiac exam reveals a regular rhythm. Lungs are clear to auscultation.  Plan: Proceed with baseline screening colonoscopy

## 2015-04-29 NOTE — Anesthesia Postprocedure Evaluation (Signed)
  Anesthesia Post-op Note  Patient: Shannon Oliver  Procedure(s) Performed: Procedure(s) (LRB): COLONOSCOPY WITH PROPOFOL (N/A)  Patient Location: PACU  Anesthesia Type: MAC  Level of Consciousness: awake and alert   Airway and Oxygen Therapy: Patient Spontanous Breathing  Post-op Pain: mild  Post-op Assessment: Post-op Vital signs reviewed, Patient's Cardiovascular Status Stable, Respiratory Function Stable, Patent Airway and No signs of Nausea or vomiting  Last Vitals:  Filed Vitals:   04/29/15 1020  BP: 145/74  Pulse:   Temp:   Resp: 16    Post-op Vital Signs: stable   Complications: No apparent anesthesia complications

## 2015-04-29 NOTE — Transfer of Care (Signed)
Immediate Anesthesia Transfer of Care Note  Patient: Shannon Oliver  Procedure(s) Performed: Procedure(s): COLONOSCOPY WITH PROPOFOL (N/A)  Patient Location: PACU  Anesthesia Type:MAC  Level of Consciousness: awake, alert  and oriented  Airway & Oxygen Therapy: Patient Spontanous Breathing and Patient connected to face mask oxygen  Post-op Assessment: Report given to RN and Post -op Vital signs reviewed and stable  Post vital signs: Reviewed and stable  Last Vitals:  Filed Vitals:   04/29/15 0846  BP: 145/75  Pulse: 66  Temp: 36.7 C  Resp: 14    Complications: No apparent anesthesia complications

## 2015-04-29 NOTE — Op Note (Signed)
Procedure: Baseline screening colonoscopy  Endoscopist: Earle Gell  Premedication: Propofol administered by anesthesia  Procedure: The patient was placed in the left lateral decubitus position. Anal inspection and digital rectal exam were normal. The Pentax pediatric colonoscope was introduced into the rectum and advanced to the cecum. A normal-appearing appendiceal orifice and ileocecal valve were identified. Colonic preparation for the exam today was good. Withdrawal time was 9 minutes  Rectum. Normal. Retroflexed view of the distal rectum was normal  Sigmoid colon and descending colon. Normal  Splenic flexure. Normal  Transverse colon. Normal  Hepatic flexure. Normal  Ascending colon. Normal  Cecum and ileocecal valve. Normal  Assessment: Normal screening colonoscopy  Recommendation: Schedule repeat screening colonoscopy in 10 years

## 2015-04-29 NOTE — Anesthesia Preprocedure Evaluation (Addendum)
Anesthesia Evaluation  Patient identified by MRN, date of birth, ID band Patient awake    Reviewed: Allergy & Precautions, NPO status , Patient's Chart, lab work & pertinent test results  Airway Mallampati: II  TM Distance: >3 FB Neck ROM: Full    Dental no notable dental hx.    Pulmonary shortness of breath,    Pulmonary exam normal breath sounds clear to auscultation       Cardiovascular hypertension, Pt. on medications and Pt. on home beta blockers Normal cardiovascular exam Rhythm:Regular Rate:Normal     Neuro/Psych negative neurological ROS  negative psych ROS   GI/Hepatic Neg liver ROS, GERD  ,  Endo/Other  diabetes, Type 2, Oral Hypoglycemic AgentsMorbid obesity  Renal/GU negative Renal ROS  negative genitourinary   Musculoskeletal negative musculoskeletal ROS (+)   Abdominal (+) + obese,   Peds negative pediatric ROS (+)  Hematology negative hematology ROS (+)   Anesthesia Other Findings   Reproductive/Obstetrics negative OB ROS                            Anesthesia Physical Anesthesia Plan  ASA: III  Anesthesia Plan: MAC   Post-op Pain Management:    Induction: Intravenous  Airway Management Planned: Natural Airway  Additional Equipment:   Intra-op Plan:   Post-operative Plan:   Informed Consent: I have reviewed the patients History and Physical, chart, labs and discussed the procedure including the risks, benefits and alternatives for the proposed anesthesia with the patient or authorized representative who has indicated his/her understanding and acceptance.   Dental advisory given  Plan Discussed with: CRNA  Anesthesia Plan Comments:         Anesthesia Quick Evaluation

## 2015-04-30 ENCOUNTER — Encounter (HOSPITAL_COMMUNITY): Payer: Self-pay | Admitting: Gastroenterology

## 2015-09-10 ENCOUNTER — Ambulatory Visit
Admission: RE | Admit: 2015-09-10 | Discharge: 2015-09-10 | Disposition: A | Discharge status: Home / Self Care | Payer: BLUE CROSS/BLUE SHIELD | Source: Ambulatory Visit | Attending: Internal Medicine | Admitting: Internal Medicine

## 2015-09-10 ENCOUNTER — Other Ambulatory Visit: Payer: Self-pay | Admitting: Internal Medicine

## 2015-09-10 DIAGNOSIS — R2232 Localized swelling, mass and lump, left upper limb: Secondary | ICD-10-CM

## 2015-09-10 IMAGING — CR DG FINGER RING 2+V*L*
3 series · 3 of 3 positions shown · non-contrast
Comparison: None.

CLINICAL DATA: Left fourth finger pain for several months without
injury.

EXAM:
LEFT RING FINGER 2+V

[x finger pa left]
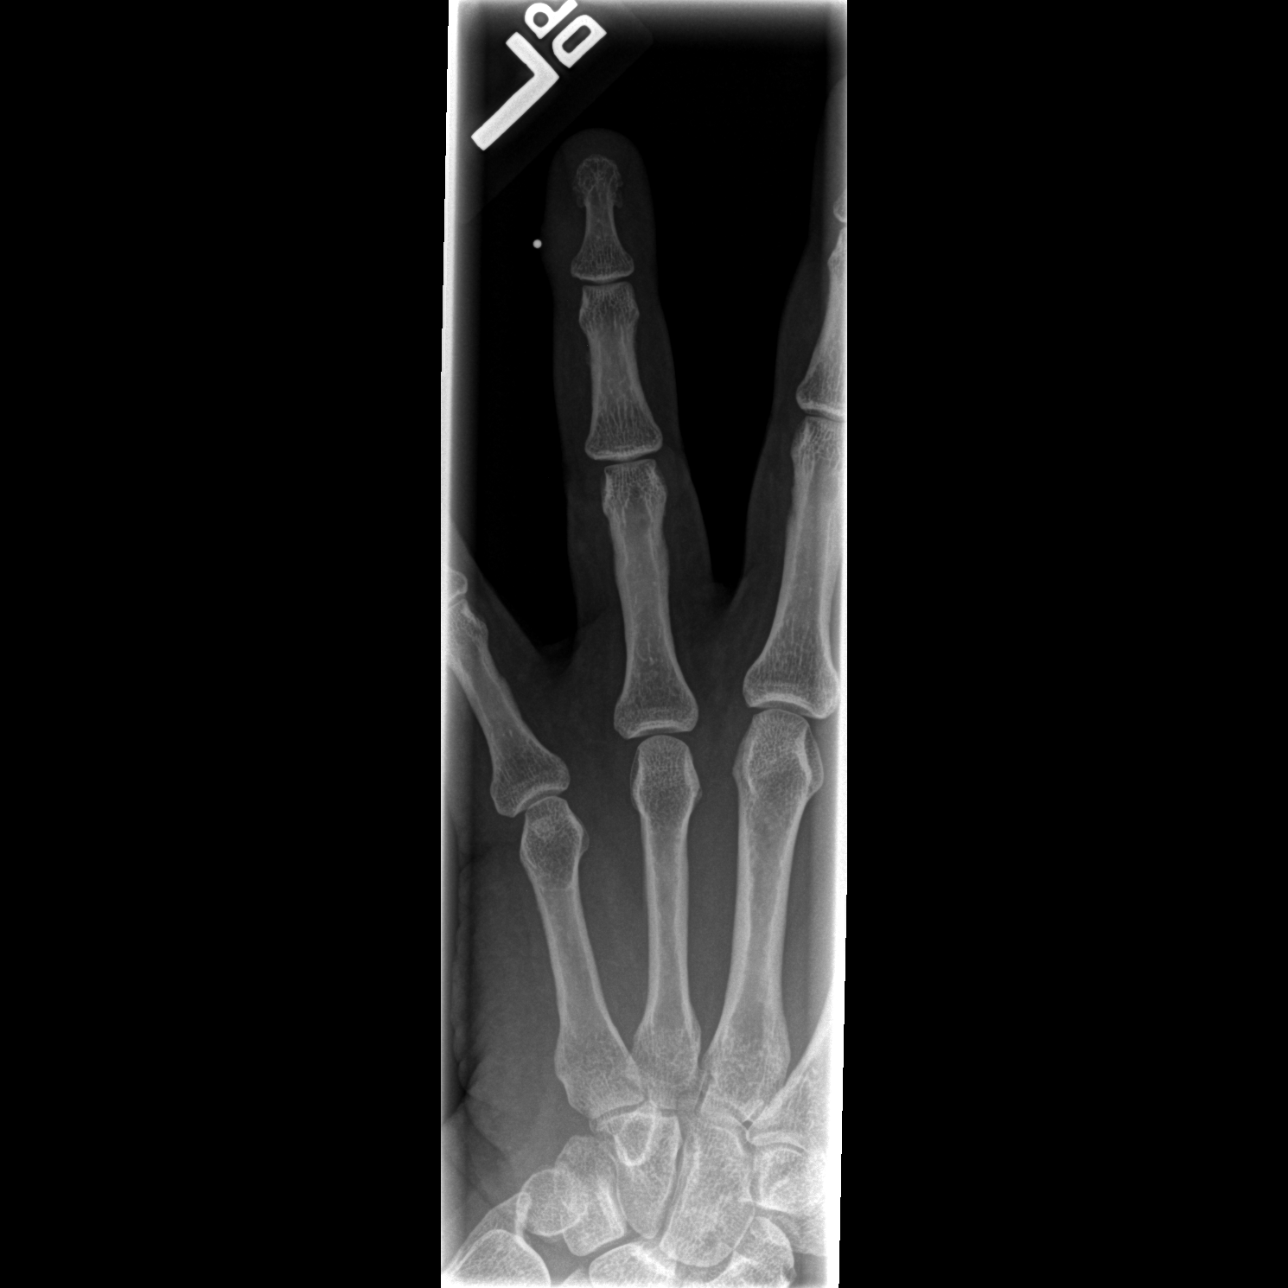

[x finger obl. left]
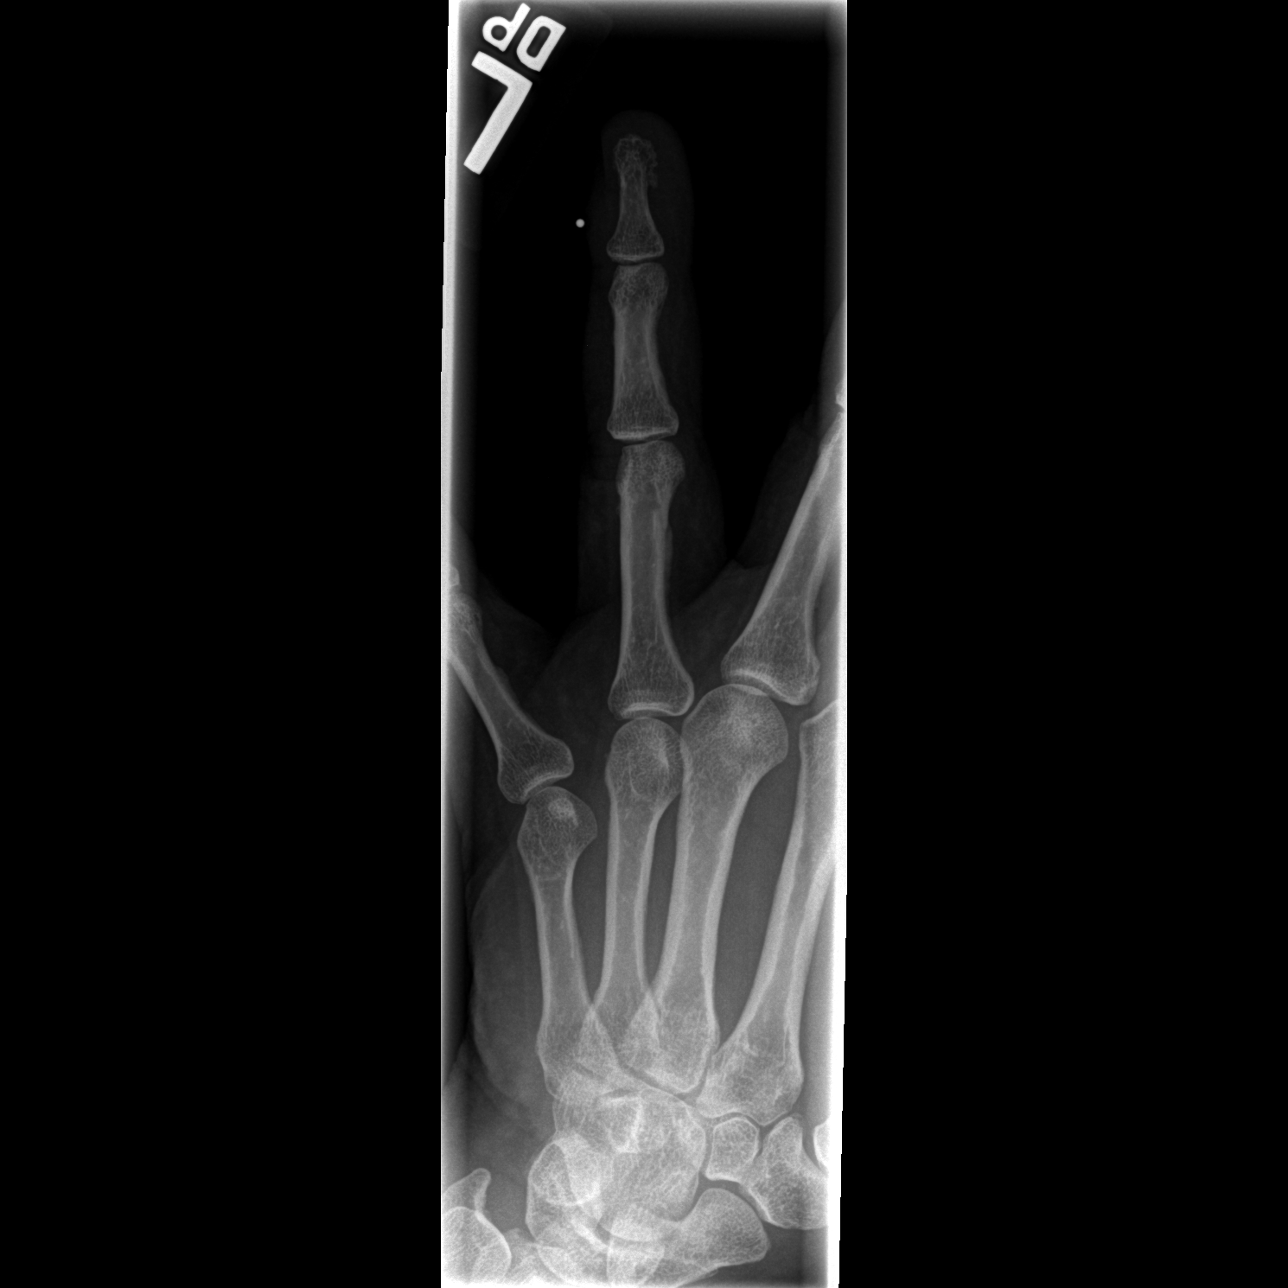

[x finger lateral left]
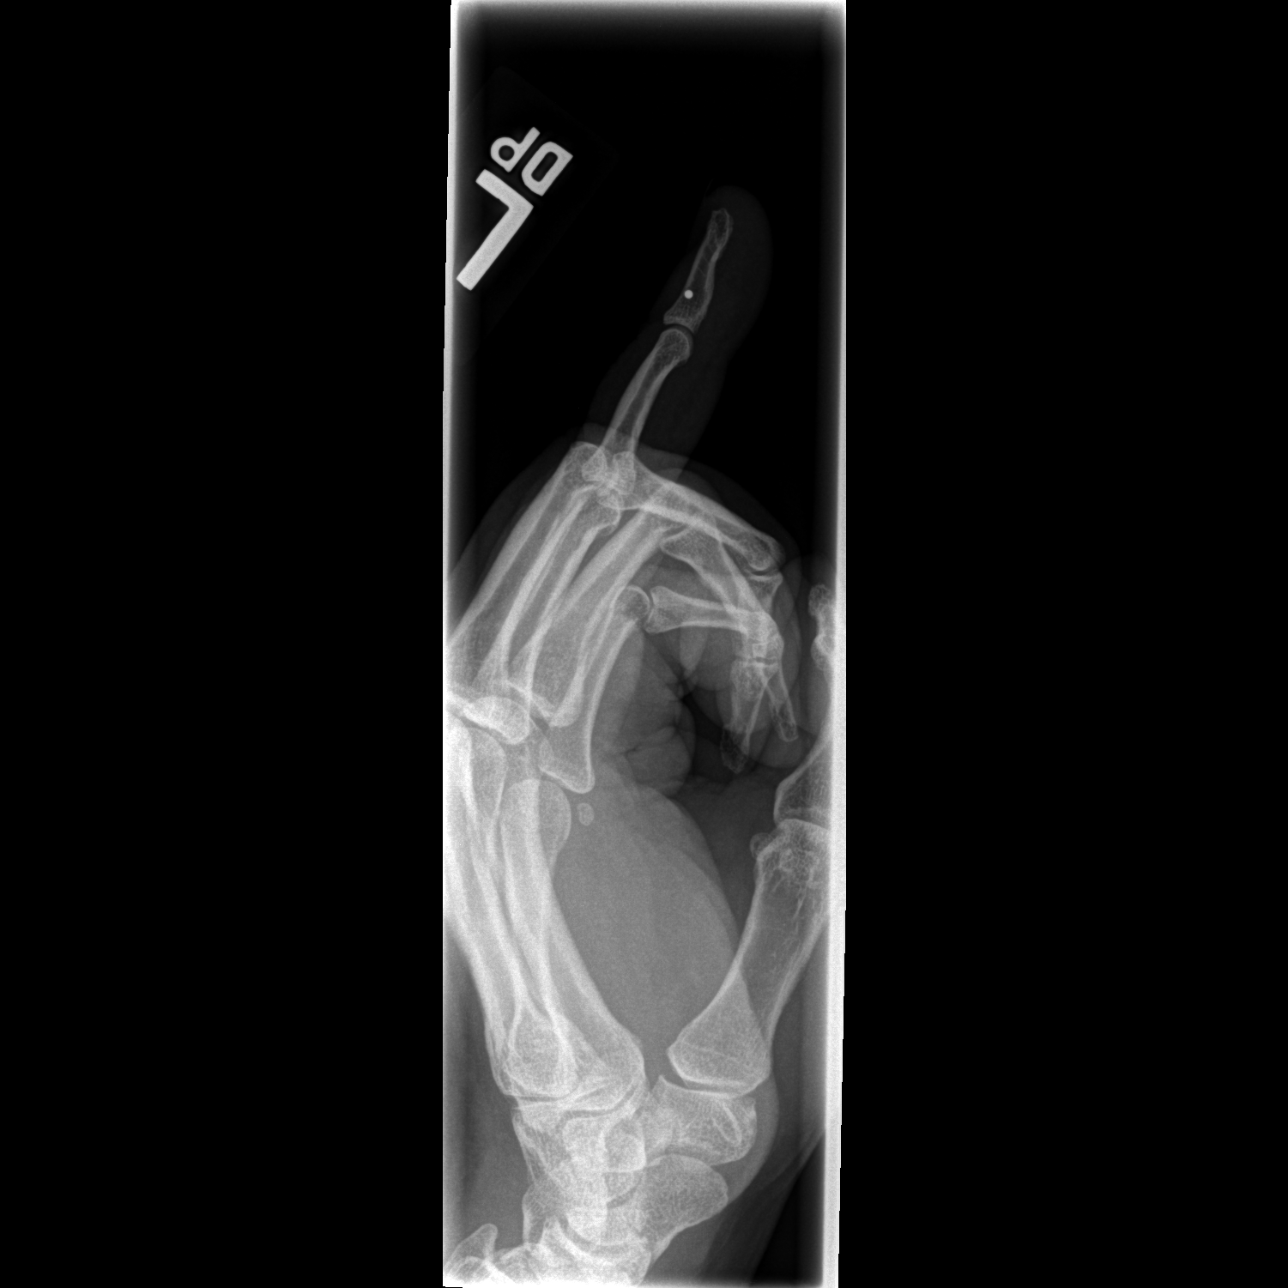

[3 of 3 positions shown; findings below may reference images not displayed]

FINDINGS: There is no evidence of fracture or dislocation. There is no
evidence of arthropathy or other focal bone abnormality. Soft
tissues are unremarkable.
IMPRESSION: Normal left fourth finger.

## 2015-10-29 DIAGNOSIS — R2232 Localized swelling, mass and lump, left upper limb: Secondary | ICD-10-CM | POA: Diagnosis not present

## 2015-11-06 ENCOUNTER — Other Ambulatory Visit: Payer: Self-pay | Admitting: Orthopedic Surgery

## 2015-12-18 ENCOUNTER — Encounter (HOSPITAL_BASED_OUTPATIENT_CLINIC_OR_DEPARTMENT_OTHER): Payer: Self-pay

## 2015-12-18 ENCOUNTER — Ambulatory Visit (HOSPITAL_BASED_OUTPATIENT_CLINIC_OR_DEPARTMENT_OTHER): Admit: 2015-12-18 | Payer: BLUE CROSS/BLUE SHIELD | Admitting: Orthopedic Surgery

## 2015-12-18 SURGERY — EXCISION MASS
Anesthesia: Choice | Site: Finger | Laterality: Left

## 2016-03-03 DIAGNOSIS — H2513 Age-related nuclear cataract, bilateral: Secondary | ICD-10-CM | POA: Diagnosis not present

## 2016-03-03 DIAGNOSIS — E119 Type 2 diabetes mellitus without complications: Secondary | ICD-10-CM | POA: Diagnosis not present

## 2016-03-11 ENCOUNTER — Encounter (HOSPITAL_COMMUNITY): Payer: Self-pay | Admitting: Emergency Medicine

## 2016-03-11 ENCOUNTER — Emergency Department (HOSPITAL_COMMUNITY): Payer: BLUE CROSS/BLUE SHIELD

## 2016-03-11 ENCOUNTER — Emergency Department (HOSPITAL_COMMUNITY)
Admission: EM | Admit: 2016-03-11 | Discharge: 2016-03-11 | Disposition: A | Discharge status: Home / Self Care | Payer: BLUE CROSS/BLUE SHIELD | Attending: Emergency Medicine | Admitting: Emergency Medicine

## 2016-03-11 DIAGNOSIS — M79674 Pain in right toe(s): Secondary | ICD-10-CM | POA: Diagnosis not present

## 2016-03-11 DIAGNOSIS — Z7984 Long term (current) use of oral hypoglycemic drugs: Secondary | ICD-10-CM | POA: Diagnosis not present

## 2016-03-11 DIAGNOSIS — Z79899 Other long term (current) drug therapy: Secondary | ICD-10-CM | POA: Diagnosis not present

## 2016-03-11 DIAGNOSIS — M109 Gout, unspecified: Secondary | ICD-10-CM | POA: Diagnosis not present

## 2016-03-11 DIAGNOSIS — E119 Type 2 diabetes mellitus without complications: Secondary | ICD-10-CM | POA: Diagnosis not present

## 2016-03-11 DIAGNOSIS — I10 Essential (primary) hypertension: Secondary | ICD-10-CM | POA: Diagnosis not present

## 2016-03-11 LAB — I-STAT CHEM 8, ED
BUN: 18 mg/dL (ref 6–20)
CALCIUM ION: 1.21 mmol/L (ref 1.15–1.40)
Chloride: 102 mmol/L (ref 101–111)
Creatinine, Ser: 0.8 mg/dL (ref 0.44–1.00)
GLUCOSE: 188 mg/dL — AB (ref 65–99)
HCT: 38 % (ref 36.0–46.0)
HEMOGLOBIN: 12.9 g/dL (ref 12.0–15.0)
POTASSIUM: 3.6 mmol/L (ref 3.5–5.1)
SODIUM: 141 mmol/L (ref 135–145)
TCO2: 28 mmol/L (ref 0–100)

## 2016-03-11 LAB — URIC ACID: URIC ACID, SERUM: 6.7 mg/dL — AB (ref 2.3–6.6)

## 2016-03-11 IMAGING — DX DG TOE GREAT 2+V*R*
3 series · 3 of 3 positions shown · non-contrast
Comparison: None.

CLINICAL DATA: Worsening great toe pain and inability to bear
weight. No known injury.

EXAM:
RIGHT GREAT TOE -THREE VIEW

[x toes ap right]
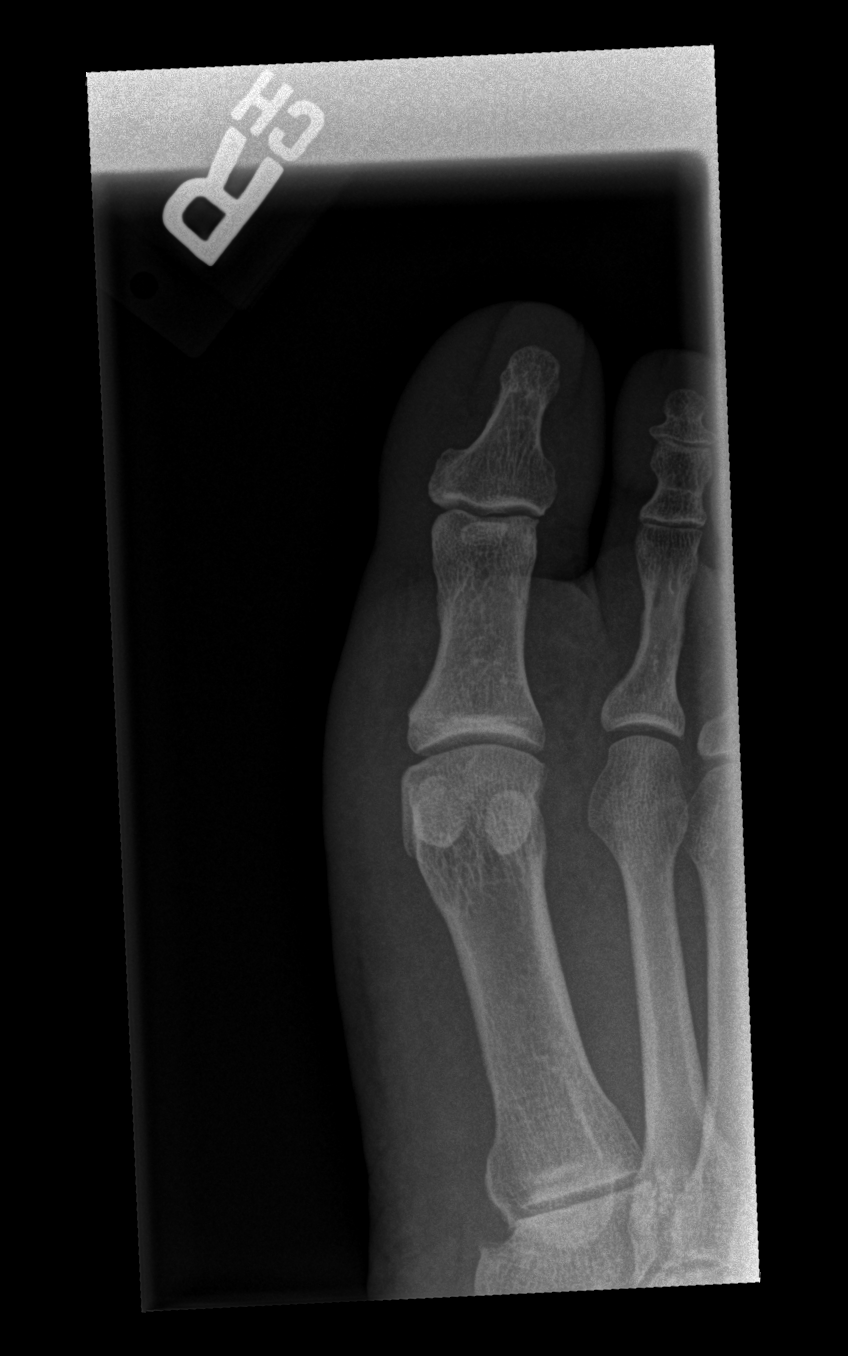

[x toes obl right]
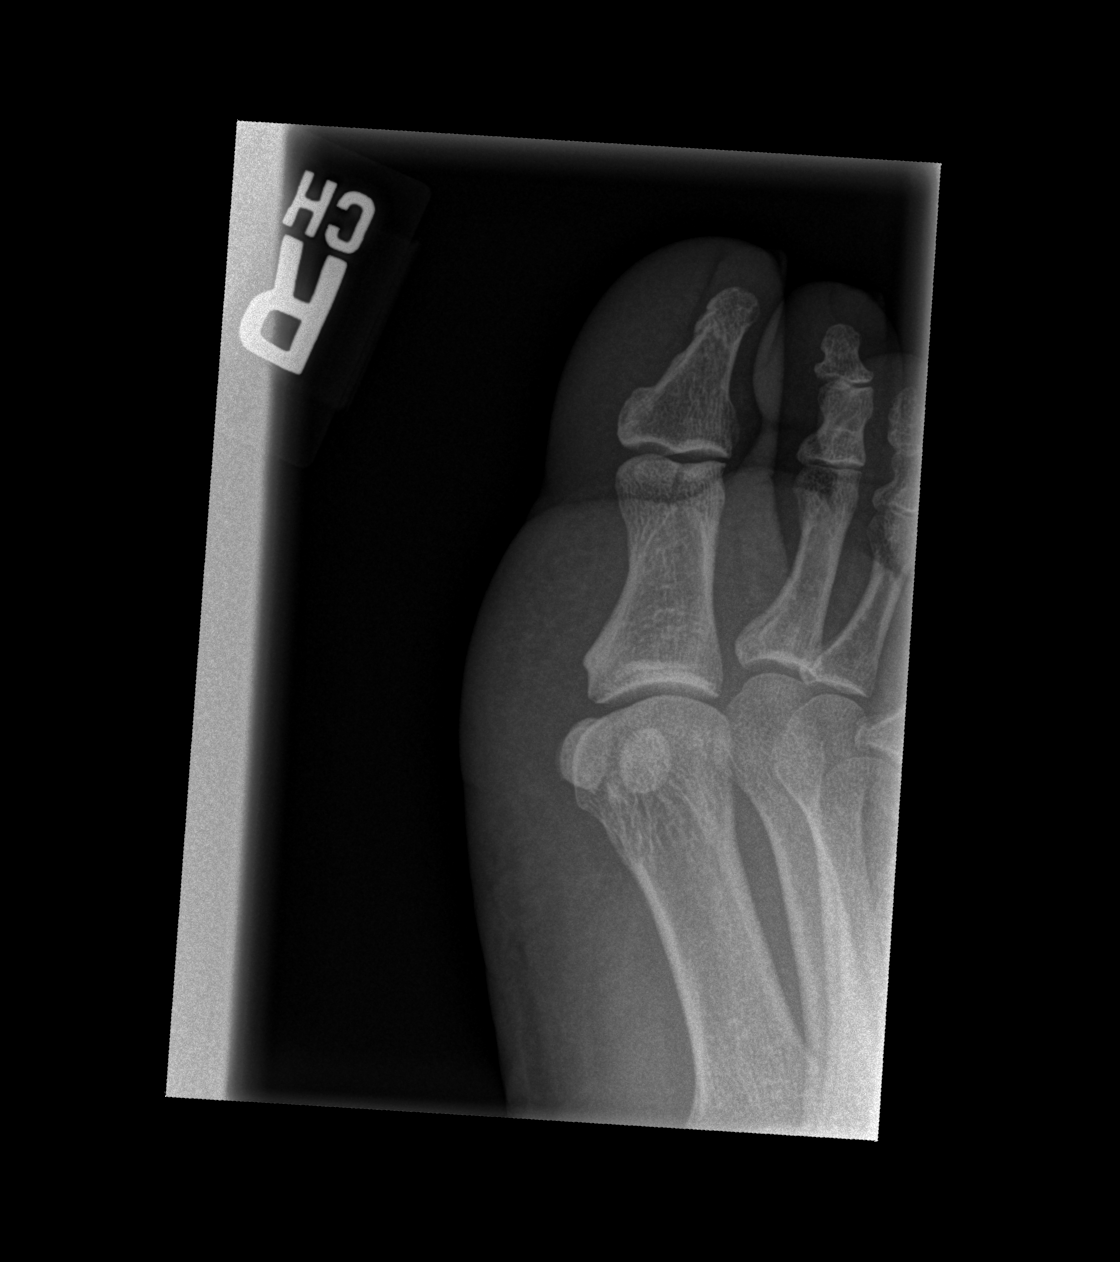

[x toes lat right]
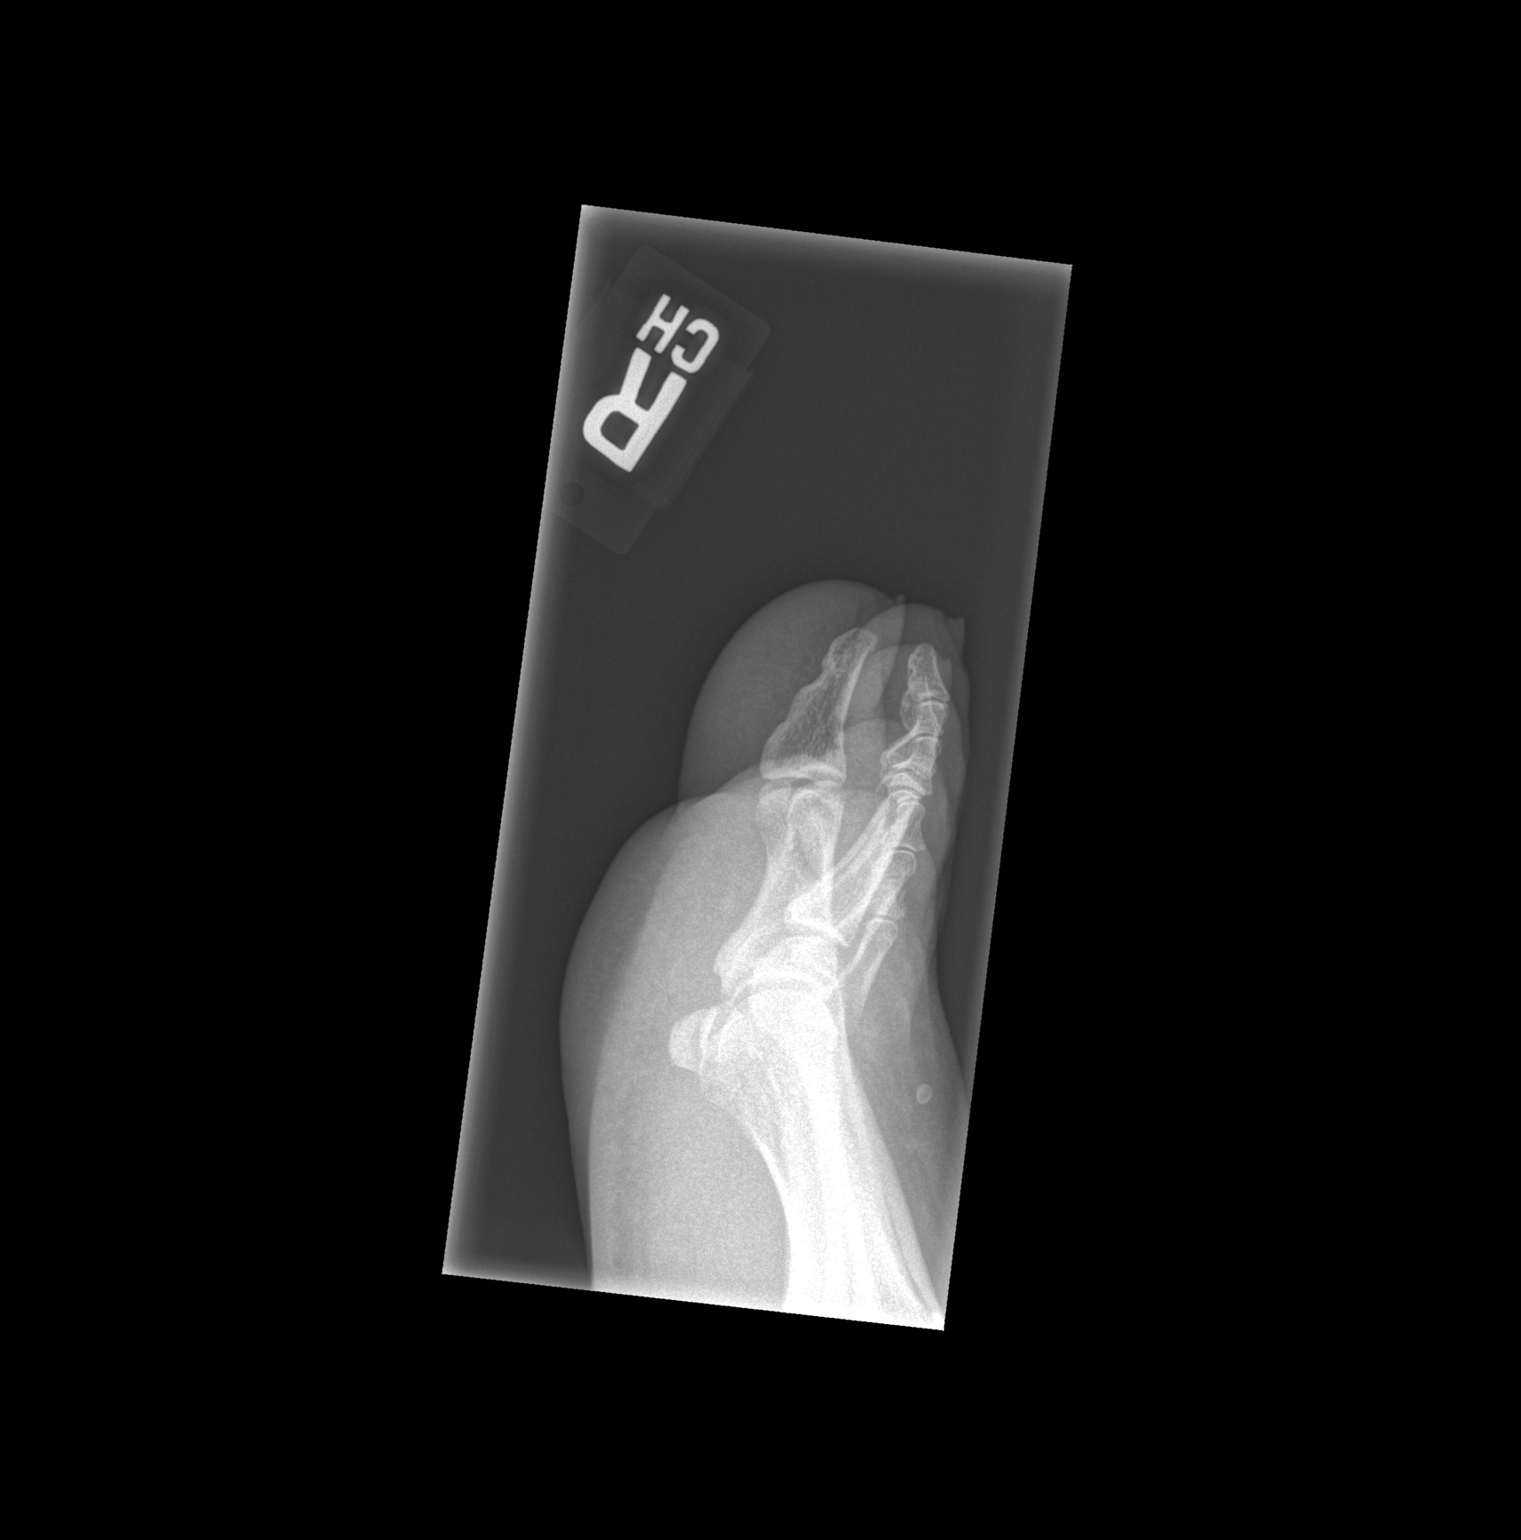

[3 of 3 positions shown; findings below may reference images not displayed]

FINDINGS: There is no evidence of fracture or dislocation. There is no
evidence of arthropathy or other focal bone abnormality. Soft
tissues are unremarkable.
IMPRESSION: Negative.

## 2016-03-11 MED ORDER — OXYCODONE-ACETAMINOPHEN 5-325 MG PO TABS: 1.0000 | ORAL_TABLET | Freq: Four times a day (QID) | ORAL | 0 refills | Status: DC | PRN

## 2016-03-11 MED ORDER — OXYCODONE-ACETAMINOPHEN 5-325 MG PO TABS
1.0000 | ORAL_TABLET | Freq: Once | ORAL | Status: AC
  Administered 2016-03-11: 1 via ORAL
  Filled 2016-03-11: qty 1

## 2016-03-11 MED ORDER — PREDNISONE 10 MG PO TABS: 20.0000 mg | ORAL_TABLET | Freq: Two times a day (BID) | ORAL | 0 refills | Status: DC

## 2016-03-11 MED ORDER — IBUPROFEN 400 MG PO TABS
600.0000 mg | ORAL_TABLET | Freq: Once | ORAL | Status: AC
  Administered 2016-03-11: 600 mg via ORAL
  Filled 2016-03-11: qty 1

## 2016-03-11 NOTE — ED Provider Notes (Signed)
[facility] DEPT Provider Note   CSN: [id number] Arrival date & time: 03/11/16  1547  By signing my name below, I, Emmanuella Mensah, attest that this documentation has been prepared under the direction and in the presence of Debroah Baller, NP. Electronically Signed: Judithann Sauger, ED Scribe. 03/11/16. 5:08 PM.   History   Chief Complaint Chief Complaint  Patient presents with  . Toe Pain    HPI Comments: Shannon Oliver is a 55 y.o. female with a hx of DM and hypertension who presents to the Emergency Department complaining of gradually worsening moderate throbbing pain to her right great toe with swelling onset this morning. No alleviating factors noted. She reports that she has tried Tylenol with no significant relief. She denies any recent falls, injuries, or trauma to the toe but states that she works on her feet. She denies a hx of gout. No fever, chills, generalized rash, or any open wounds.   The history is provided by the patient. No language interpreter was used.    Past Medical History:  Diagnosis Date  . Abnormal EKG    LVH with strain  . Acid reflux   . Diabetes mellitus    A1C over 9  . HLD (hyperlipidemia)   . Hypertension   . Noncompliance   . Obesity     Patient Active Problem List   Diagnosis Date Noted  . Neck pain 03/01/2014  . Edema 01/25/2013  . DM (diabetes mellitus) ([place]) 01/25/2013  . Dyspnea 01/25/2013  . Abnormal ECG 01/25/2013  . Hypertension   . Acid reflux     Past Surgical History:  Procedure Laterality Date  . COLONOSCOPY WITH PROPOFOL N/A 04/29/2015   Procedure: COLONOSCOPY WITH PROPOFOL;  Surgeon: Garlan Fair, MD;  Location: [facility] ENDOSCOPY;  Service: Endoscopy;  Laterality: N/A;  . KNEE ARTHROSCOPY W/ MENISCAL REPAIR Right     OB History    No data available       Home Medications    Prior to Admission medications   Medication Sig Start Date End Date Taking? Authorizing Provider  acetaminophen (TYLENOL) 500 MG  tablet Take 500-1,000 mg by mouth every 6 (six) hours as needed for mild pain.    Historical Provider, MD  amLODipine (NORVASC) 5 MG tablet Take 5 mg by mouth at bedtime.     Historical Provider, MD  glimepiride (AMARYL) 2 MG tablet Take 2 mg by mouth daily before breakfast.    Historical Provider, MD  losartan-hydrochlorothiazide (HYZAAR) 100-25 MG per tablet Take 1 tablet by mouth every morning.     Historical Provider, MD  MELATONIN PO Take by mouth.    Historical Provider, MD  metFORMIN (GLUCOPHAGE) 500 MG tablet Take 500 mg by mouth 2 (two) times daily with a meal.    Historical Provider, MD  methocarbamol (ROBAXIN-750) 750 MG tablet Take 1 tablet (750 mg total) by mouth 4 (four) times daily. 02/28/14   Dene Gentry, MD  metoprolol tartrate (LOPRESSOR) 25 MG tablet Take 25 mg by mouth 2 (two) times daily.    Historical Provider, MD  Multiple Vitamins-Calcium (ONE-A-DAY WOMENS PO) Take 1 tablet by mouth daily.    Historical Provider, MD  oxyCODONE-acetaminophen (ROXICET) 5-325 MG tablet Take 1 tablet by mouth every 6 (six) hours as needed for severe pain. 03/11/16   Tylin Stradley Bunnie Pion, NP  predniSONE (DELTASONE) 10 MG tablet Take 2 tablets (20 mg total) by mouth 2 (two) times daily with a meal. 03/11/16   Gowri Suchan Bunnie Pion, NP  simvastatin (ZOCOR) 10 MG tablet Take 10 mg by mouth daily.    Historical Provider, MD    Family History History reviewed. No pertinent family history.  Social History Social History  Substance Use Topics  . Smoking status: Never Smoker  . Smokeless tobacco: Not on file  . Alcohol use No     Allergies   Hydrocodone   Review of Systems Review of Systems  Constitutional: Negative for chills and fever.  Musculoskeletal: Positive for arthralgias and joint swelling.  Skin: Negative for rash and wound.     Physical Exam Updated Vital Signs BP (!) 192/108 (BP Location: Left Arm)   Pulse 73   Temp 98.3 F (36.8 C) (Oral)   Resp 20   SpO2 100%   Physical Exam    Constitutional: She is oriented to person, place, and time. She appears well-developed and well-nourished. No distress.  HENT:  Head: Normocephalic and atraumatic.  Eyes: Conjunctivae and EOM are normal.  Neck: Neck supple. No tracheal deviation present.  Cardiovascular: Normal rate.   Pulmonary/Chest: Effort normal. No respiratory distress.  Musculoskeletal: Normal range of motion. She exhibits tenderness.  Tenderness to her right great toe at the DIP, extending to the tip. Slightly increased warmth. No red streaking or signs of infection.   Neurological: She is alert and oriented to person, place, and time.  Skin: Skin is warm and dry.  Psychiatric: She has a normal mood and affect. Her behavior is normal.  Nursing note and vitals reviewed.    ED Treatments / Results  DIAGNOSTIC STUDIES: Oxygen Saturation is 100% on RA, normal by my interpretation.    COORDINATION OF CARE: 4:35 PM- Pt advised of plan for treatment and pt agrees. Pt will receive x-ray for further evaluation. Explained that pt will be treated for Gout if her x-rays are normal.   Lab Labs Reviewed  URIC ACID - Abnormal; Notable for the following:       Result Value   Uric Acid, Serum 6.7 (*)    All other components within normal limits  I-STAT CHEM 8, ED - Abnormal; Notable for the following:    Glucose, Bld 188 (*)    All other components within normal limits    Radiology No results found.  Procedures Procedures (including critical care time)  Medications Ordered in ED Medications  ibuprofen (ADVIL,MOTRIN) tablet 600 mg (600 mg Oral Given 03/11/16 1649)  oxyCODONE-acetaminophen (PERCOCET/ROXICET) 5-325 MG per tablet 1 tablet (1 tablet Oral Given 03/11/16 1922)     Initial Impression / Assessment and Plan / ED Course  Debroah Baller, NP has reviewed the triage vital signs and the nursing notes.  Pertinent labs & imaging results that were available during my care of the patient were reviewed by me and  considered in my medical decision making (see chart for details).  Clinical Course    Final Clinical Impressions(s) / ED Diagnoses  55 y.o. female with right great toe pain stable for d/c without abnormal findings on x-ray and no focal neuro deficits. Will treat for pain and inflammation and she will f/u with her PCP. Discussed with the patient clinical and x-ray findings and plan of careand all questioned fully answered. She will return here if any problems arise.  Discussed close monitoring of glucose while taking the prednisone. Patient has pain medication at home.  Final diagnoses:  Gout of big toe    New Prescriptions Discharge Medication List as of 03/11/2016  7:07 PM    START taking these  medications   Details  predniSONE (DELTASONE) 10 MG tablet Take 2 tablets (20 mg total) by mouth 2 (two) times daily with a meal., Starting Thu 03/11/2016, Print       *I personally performed the services described in this documentation, which was scribed in my presence. The recorded information has been reviewed and is accurate.    [place], [place] 03/14/16 2042    Duffy Bruce, MD 03/15/16 [phone]

## 2016-03-11 NOTE — ED Triage Notes (Signed)
Pt here for pain to right great toe starting this am

## 2016-03-12 DIAGNOSIS — I10 Essential (primary) hypertension: Secondary | ICD-10-CM | POA: Diagnosis not present

## 2016-03-12 DIAGNOSIS — E1121 Type 2 diabetes mellitus with diabetic nephropathy: Secondary | ICD-10-CM | POA: Diagnosis not present

## 2016-03-12 DIAGNOSIS — E785 Hyperlipidemia, unspecified: Secondary | ICD-10-CM | POA: Diagnosis not present

## 2016-03-12 DIAGNOSIS — Z23 Encounter for immunization: Secondary | ICD-10-CM | POA: Diagnosis not present

## 2016-03-12 DIAGNOSIS — M109 Gout, unspecified: Secondary | ICD-10-CM | POA: Diagnosis not present

## 2016-03-26 DIAGNOSIS — I10 Essential (primary) hypertension: Secondary | ICD-10-CM | POA: Diagnosis not present

## 2016-03-26 DIAGNOSIS — E785 Hyperlipidemia, unspecified: Secondary | ICD-10-CM | POA: Diagnosis not present

## 2016-03-26 DIAGNOSIS — E1121 Type 2 diabetes mellitus with diabetic nephropathy: Secondary | ICD-10-CM | POA: Diagnosis not present

## 2016-05-17 DIAGNOSIS — J0110 Acute frontal sinusitis, unspecified: Secondary | ICD-10-CM | POA: Diagnosis not present

## 2016-09-03 DIAGNOSIS — J0120 Acute ethmoidal sinusitis, unspecified: Secondary | ICD-10-CM | POA: Diagnosis not present

## 2016-09-03 DIAGNOSIS — M25552 Pain in left hip: Secondary | ICD-10-CM | POA: Diagnosis not present

## 2016-09-03 DIAGNOSIS — M25551 Pain in right hip: Secondary | ICD-10-CM | POA: Diagnosis not present

## 2016-09-06 DIAGNOSIS — M25551 Pain in right hip: Secondary | ICD-10-CM | POA: Diagnosis not present

## 2016-09-06 DIAGNOSIS — M545 Low back pain: Secondary | ICD-10-CM | POA: Diagnosis not present

## 2016-09-06 DIAGNOSIS — M25552 Pain in left hip: Secondary | ICD-10-CM | POA: Diagnosis not present

## 2016-09-28 DIAGNOSIS — E1121 Type 2 diabetes mellitus with diabetic nephropathy: Secondary | ICD-10-CM | POA: Diagnosis not present

## 2016-09-28 DIAGNOSIS — J209 Acute bronchitis, unspecified: Secondary | ICD-10-CM | POA: Diagnosis not present

## 2016-10-05 DIAGNOSIS — Z23 Encounter for immunization: Secondary | ICD-10-CM | POA: Diagnosis not present

## 2016-10-05 DIAGNOSIS — E1121 Type 2 diabetes mellitus with diabetic nephropathy: Secondary | ICD-10-CM | POA: Diagnosis not present

## 2016-10-05 DIAGNOSIS — E785 Hyperlipidemia, unspecified: Secondary | ICD-10-CM | POA: Diagnosis not present

## 2016-10-05 DIAGNOSIS — Z0000 Encounter for general adult medical examination without abnormal findings: Secondary | ICD-10-CM | POA: Diagnosis not present

## 2016-10-25 ENCOUNTER — Encounter: Payer: Self-pay | Admitting: Endocrinology

## 2016-10-25 ENCOUNTER — Ambulatory Visit (INDEPENDENT_AMBULATORY_CARE_PROVIDER_SITE_OTHER): Payer: BLUE CROSS/BLUE SHIELD | Admitting: Endocrinology

## 2016-10-25 VITALS — BP 142/82 | HR 74 | Ht 65.5 in | Wt 256.6 lb

## 2016-10-25 DIAGNOSIS — E1142 Type 2 diabetes mellitus with diabetic polyneuropathy: Secondary | ICD-10-CM | POA: Diagnosis not present

## 2016-10-25 NOTE — Patient Instructions (Addendum)
good diet and exercise significantly improve the control of your diabetes.  please let me know if you wish to be referred to a dietician.  high blood sugar is very risky to your health.  you should see an eye doctor and dentist every year.  It is very important to get all recommended vaccinations.  Controlling your blood pressure and cholesterol drastically reduces the damage diabetes does to your body.  Those who smoke should quit.  Please discuss these with your doctor.  check your blood sugar once a day.  vary the time of day when you check, between before the 3 meals, and at bedtime.  also check if you have symptoms of your blood sugar being too high or too low.  please keep a record of the readings and bring it to your next appointment here (or you can bring the meter itself).  You can write it on any piece of paper.  please call us sooner if your blood sugar goes below 70, or if you have a lot of readings over 200.  Please continue the same medications for now.   blood tests are requested for you today.  We'll let you know about the results.   If you need insulin based on the blood test, we'll work with the levemir until you use up the 5 pens.  Here is a card, that makes lantus free--we'll change when you are low on levemir.   Please come back for a follow-up appointment in 1 month.     Bariatric Surgery You have so much to gain by losing weight.  You may have already tried every diet and exercise plan imaginable.  And, you may have sought advice from your family physician, too.   Sometimes, in spite of such diligent efforts, you may not be able to achieve long-term results by yourself.  In cases of severe obesity, bariatric or weight loss surgery is a proven method of achieving long-term weight control.  Our Services Our bariatric surgery programs offer our patients new hope and long-term weight-loss solution.  Since introducing our services in 2003, we have conducted more than 2,400 successful  procedures.  Our program is designated as a Programmer, multimedia by the Metabolic and Bariatric Surgery Accreditation and Quality Improvement Program (MBSAQIP), a IT trainer that sets rigorous patient safety and outcome standards.  Our program is also designated as a Ecologist by [employer].   Our exceptional weight-loss surgery team specializes in diagnosis, treatment, follow-up care, and ongoing support for our patients with severe weight loss challenges.  We currently offer laparoscopic sleeve gastrectomy, gastric bypass, and adjustable gastric band (LAP-BAND).    Attend our [place] Choosing to undergo a bariatric procedure is a big decision, and one that should not be taken lightly.  You now have two options in how you learn about weight-loss surgery - in person or online.  Our objective is to ensure you have all of the information that you need to evaluate the advantages and obligations of this life changing procedure.  Please note that you are not alone in this process, and our experienced team is ready to assist and answer all of your questions.  There are several ways to register for a seminar (either on-line or in person): 1)  Call [phone] 2) Go on-line to [facility] and register for either type of seminar.  [email or web address]

## 2016-10-25 NOTE — Progress Notes (Signed)
Subjective:    Patient ID: Shannon Oliver, female    DOB: [birth year], 56 y.o.   MRN: [id number]  HPI pt is referred by Dr Fara Olden, for diabetes.  Pt states DM was dx'ed in 2008 (she had GDM in 1999); she has mild neuropathy of the lower extremities; she is unaware of any associated chronic complications; she has never been on insulin (it was rx'ed, but pt did not take); pt says her diet and exercise are poor; she has never had pancreatitis, pancreatic surgery, severe hypoglycemia or DKA.  She says depression complicates the rx of her DM. She says cbg's are much lower (70's) for the past few weeks.    Past Medical History:  Diagnosis Date  . Abnormal EKG    LVH with strain  . Acid reflux   . Diabetes mellitus    A1C over 9  . HLD (hyperlipidemia)   . Hypertension   . Noncompliance   . Obesity     Past Surgical History:  Procedure Laterality Date  . COLONOSCOPY WITH PROPOFOL N/A 04/29/2015   Procedure: COLONOSCOPY WITH PROPOFOL;  Surgeon: Garlan Fair, MD;  Location: [facility];  Service: Endoscopy;  Laterality: N/A;  . KNEE ARTHROSCOPY W/ MENISCAL REPAIR Right     Social History   Social History  . Marital status: Married    Spouse name: N/A  . Number of children: N/A  . Years of education: N/A   Occupational History  . Not on file.   Social History Main Topics  . Smoking status: Never Smoker  . Smokeless tobacco: Never Used  . Alcohol use No  . Drug use: No  . Sexual activity: Not on file   Other Topics Concern  . Not on file   Social History Narrative  . No narrative on file    Current Outpatient Prescriptions on File Prior to Visit  Medication Sig Dispense Refill  . acetaminophen (TYLENOL) 500 MG tablet Take 500-1,000 mg by mouth every 6 (six) hours as needed for mild pain.    Marland Kitchen amLODipine (NORVASC) 5 MG tablet Take 5 mg by mouth at bedtime.     Marland Kitchen glimepiride (AMARYL) 2 MG tablet Take 2 mg by mouth daily before breakfast.    .  losartan-hydrochlorothiazide (HYZAAR) 100-25 MG per tablet Take 1 tablet by mouth every morning.     Marland Kitchen MELATONIN PO Take by mouth.    . metFORMIN (GLUCOPHAGE) 500 MG tablet Take 500 mg by mouth 2 (two) times daily with a meal.    . metoprolol tartrate (LOPRESSOR) 25 MG tablet Take 25 mg by mouth 2 (two) times daily.    . Multiple Vitamins-Calcium (ONE-A-DAY WOMENS PO) Take 1 tablet by mouth daily.    . simvastatin (ZOCOR) 10 MG tablet Take 10 mg by mouth daily.    . methocarbamol (ROBAXIN-750) 750 MG tablet Take 1 tablet (750 mg total) by mouth 4 (four) times daily. (Patient not taking: Reported on 10/25/2016) 60 tablet 0   No current facility-administered medications on file prior to visit.     Allergies  Allergen Reactions  . Hydrocodone Hypertension    Family History  Problem Relation Age of Onset  . Diabetes Neg Hx     BP (!) 142/82   Pulse 74   Ht 5' 5.5" (1.664 m)   Wt 256 lb 9.6 oz (116.4 kg)   SpO2 98%   BMI 42.05 kg/m   Review of Systems denies blurry vision, headache, chest pain, sob, n/v, muscle  cramps, excessive diaphoresis, memory loss, cold intolerance, rhinorrhea, and easy bruising.  She has lost a few lbs.  She has frequent urination.       Objective:   Physical Exam  VS: see vs page GEN: no distress HEAD: head: no deformity eyes: no periorbital swelling, no proptosis external nose and ears are normal mouth: no lesion seen NECK: supple, thyroid is not enlarged CHEST WALL: no deformity LUNGS: clear to auscultation CV: reg rate and rhythm, no murmur ABD: abdomen is soft, nontender.  no hepatosplenomegaly.  not distended.  no hernia MUSCULOSKELETAL: muscle bulk and strength are grossly normal.  no obvious joint swelling.  gait is normal and steady EXTEMITIES: no deformity.  no ulcer on the feet.  feet are of normal color and temp.  no edema.  PULSES: dorsalis pedis intact bilat.  no carotid bruit.  NEURO:  cn 2-12 grossly intact.   readily moves all 4's.   sensation is intact to touch on the feet, but decreased from normal SKIN:  Normal texture and temperature.  No rash or suspicious lesion is visible.   NODES:  None palpable at the neck.   PSYCH: alert, well-oriented.  Does not appear anxious nor depressed.   a1c=10.3%  I have reviewed outside records, and summarized: Pt was noted to have severely elevated a1c, and referred here.  She did not tolerate full dosage of metformin.  Medication noncompliance was also noted  I personally reviewed electrocardiogram tracing (01/08/13): Indication: chest pain Impression: NSR.  Anterior QS complexes.  No hypertrophy. Compared to 2013: no change     Assessment & Plan:  Insulin-requiring type 2 DM, with polyneuropathy: severe exacerbation. Obesity, new to me.   Depression: this complicates the rx of DM.  I told pt rx of this is important to optimal rx of DM.   HTN: we'll recheck next time.     Patient Instructions  good diet and exercise significantly improve the control of your diabetes.  please let me know if you wish to be referred to a dietician.  high blood sugar is very risky to your health.  you should see an eye doctor and dentist every year.  It is very important to get all recommended vaccinations.  Controlling your blood pressure and cholesterol drastically reduces the damage diabetes does to your body.  Those who smoke should quit.  Please discuss these with your doctor.  check your blood sugar once a day.  vary the time of day when you check, between before the 3 meals, and at bedtime.  also check if you have symptoms of your blood sugar being too high or too low.  please keep a record of the readings and bring it to your next appointment here (or you can bring the meter itself).  You can write it on any piece of paper.  please call us sooner if your blood sugar goes below 70, or if you have a lot of readings over 200.  Please continue the same medications for now.   blood tests are  requested for you today.  We'll let you know about the results.   If you need insulin based on the blood test, we'll work with the levemir until you use up the 5 pens.  Here is a card, that makes lantus free--we'll change when you are low on levemir.   Please come back for a follow-up appointment in 1 month.     Bariatric Surgery You have so much to gain by losing weight.  You may have already tried every diet and exercise plan imaginable.  And, you may have sought advice from your family physician, too.   Sometimes, in spite of such diligent efforts, you may not be able to achieve long-term results by yourself.  In cases of severe obesity, bariatric or weight loss surgery is a proven method of achieving long-term weight control.  Our Services Our bariatric surgery programs offer our patients new hope and long-term weight-loss solution.  Since introducing our services in 2003, we have conducted more than 2,400 successful procedures.  Our program is designated as a Programmer, multimedia by the Metabolic and Bariatric Surgery Accreditation and Quality Improvement Program (MBSAQIP), a IT trainer that sets rigorous patient safety and outcome standards.  Our program is also designated as a Ecologist by [employer].   Our exceptional weight-loss surgery team specializes in diagnosis, treatment, follow-up care, and ongoing support for our patients with severe weight loss challenges.  We currently offer laparoscopic sleeve gastrectomy, gastric bypass, and adjustable gastric band (LAP-BAND).    Attend our [place] Choosing to undergo a bariatric procedure is a big decision, and one that should not be taken lightly.  You now have two options in how you learn about weight-loss surgery - in person or online.  Our objective is to ensure you have all of the information that you need to evaluate the advantages and obligations of this life changing procedure.  Please  note that you are not alone in this process, and our experienced team is ready to assist and answer all of your questions.  There are several ways to register for a seminar (either on-line or in person): 1)  Call [phone] 2) Go on-line to [facility] and register for either type of seminar.  [email or web address]

## 2016-10-27 LAB — FRUCTOSAMINE: FRUCTOSAMINE: 336 umol/L — AB (ref 190–270)

## 2016-11-24 ENCOUNTER — Ambulatory Visit: Payer: BLUE CROSS/BLUE SHIELD | Admitting: Endocrinology

## 2016-11-24 DIAGNOSIS — Z0289 Encounter for other administrative examinations: Secondary | ICD-10-CM

## 2016-12-29 ENCOUNTER — Encounter: Payer: Self-pay | Admitting: Endocrinology

## 2016-12-29 ENCOUNTER — Ambulatory Visit (INDEPENDENT_AMBULATORY_CARE_PROVIDER_SITE_OTHER): Payer: BLUE CROSS/BLUE SHIELD | Admitting: Endocrinology

## 2016-12-29 VITALS — BP 132/84 | HR 80 | Ht 65.5 in | Wt 258.0 lb

## 2016-12-29 DIAGNOSIS — E1142 Type 2 diabetes mellitus with diabetic polyneuropathy: Secondary | ICD-10-CM

## 2016-12-29 LAB — POCT GLYCOSYLATED HEMOGLOBIN (HGB A1C): HEMOGLOBIN A1C: 9.6

## 2016-12-29 MED ORDER — INSULIN GLARGINE 100 UNIT/ML SOLOSTAR PEN: 20.0000 [IU] | PEN_INJECTOR | Freq: Every day | SUBCUTANEOUS | 99 refills | Status: DC

## 2016-12-29 NOTE — Progress Notes (Addendum)
Subjective:    Patient ID: Shannon Oliver, female    DOB: 08-24-1960, 56 y.o.   MRN: [id number]  HPI  Pt returns for f/u of diabetes mellitus: DM type: Insulin-requiring type 2. Dx'ed: 7062 Complications: polyneuropathy. Therapy: insulin since 2018, and 2 oral meds GDM: 1999 DKA: never Severe hypoglycemia: never Pancreatitis: never Pancreatic imaging: never Other: she works 3rd shift; she says depression complicates the rx of her DM; she is on qd insulin, at least for now.   Interval history: She takes levemir, amaryl, and metformin.  no cbg record, but states cbg's are in the 200's.  She has 2 more levemir pens left.   Past Medical History:  Diagnosis Date  . Abnormal EKG    LVH with strain  . Acid reflux   . Diabetes mellitus    A1C over 9  . HLD (hyperlipidemia)   . Hypertension   . Noncompliance   . Obesity     Past Surgical History:  Procedure Laterality Date  . COLONOSCOPY WITH PROPOFOL N/A 04/29/2015   Procedure: COLONOSCOPY WITH PROPOFOL;  Surgeon: Garlan Fair, MD;  Location: [facility];  Service: Endoscopy;  Laterality: N/A;  . KNEE ARTHROSCOPY W/ MENISCAL REPAIR Right     Social History   Social History  . Marital status: Married    Spouse name: N/A  . Number of children: N/A  . Years of education: N/A   Occupational History  . Not on file.   Social History Main Topics  . Smoking status: Never Smoker  . Smokeless tobacco: Never Used  . Alcohol use No  . Drug use: No  . Sexual activity: Not on file   Other Topics Concern  . Not on file   Social History Narrative  . No narrative on file    Current Outpatient Prescriptions on File Prior to Visit  Medication Sig Dispense Refill  . acetaminophen (TYLENOL) 500 MG tablet Take 500-1,000 mg by mouth every 6 (six) hours as needed for mild pain.    Marland Kitchen amLODipine (NORVASC) 5 MG tablet Take 5 mg by mouth at bedtime.     Marland Kitchen glimepiride (AMARYL) 2 MG tablet Take 2 mg by mouth daily before  breakfast.    . losartan-hydrochlorothiazide (HYZAAR) 100-25 MG per tablet Take 1 tablet by mouth every morning.     Marland Kitchen MELATONIN PO Take by mouth.    . metFORMIN (GLUCOPHAGE) 500 MG tablet Take 500 mg by mouth 2 (two) times daily with a meal.    . methocarbamol (ROBAXIN-750) 750 MG tablet Take 1 tablet (750 mg total) by mouth 4 (four) times daily. 60 tablet 0  . metoprolol tartrate (LOPRESSOR) 25 MG tablet Take 25 mg by mouth 2 (two) times daily.    . Multiple Vitamins-Calcium (ONE-A-DAY WOMENS PO) Take 1 tablet by mouth daily.    . simvastatin (ZOCOR) 10 MG tablet Take 10 mg by mouth daily.     No current facility-administered medications on file prior to visit.     Allergies  Allergen Reactions  . Hydrocodone Hypertension    Family History  Problem Relation Age of Onset  . Diabetes Neg Hx     BP 132/84   Pulse 80   Ht 5' 5.5" (1.664 m)   Wt 258 lb (117 kg)   SpO2 97%   BMI 42.28 kg/m   Review of Systems She denies hypoglycemia.     Objective:   Physical Exam VITAL SIGNS:  See vs page GENERAL: no distress Pulses:  dorsalis pedis intact bilat.   MSK: no deformity of the feet CV: no leg edema Skin:  no ulcer on the feet.  normal color and temp on the feet. Neuro: sensation is intact to touch on the feet.   A1c=9.5%    Assessment & Plan:  Insulin-requiring type 2 DM, with polyneuropathy.  She needs increased rx. Shift work.  She needs a different insulin (glargine), which has a flatter time-action profile.    Patient Instructions  check your blood sugar once a day.  vary the time of day when you check, between before the 3 meals, and at bedtime.  also check if you have symptoms of your blood sugar being too high or too low.  please keep a record of the readings and bring it to your next appointment here (or you can bring the meter itself).  You can write it on any piece of paper.  please call us sooner if your blood sugar goes below 70, or if you have a lot of readings  over 200.  Please increase the levemir to 20 units daily, until you use it up.  Then please change to lantus 20 units daily. continue the same diabetes pills  Please come back for a follow-up appointment in 2 months.

## 2016-12-29 NOTE — Patient Instructions (Addendum)
check your blood sugar once a day.  vary the time of day when you check, between before the 3 meals, and at bedtime.  also check if you have symptoms of your blood sugar being too high or too low.  please keep a record of the readings and bring it to your next appointment here (or you can bring the meter itself).  You can write it on any piece of paper.  please call us sooner if your blood sugar goes below 70, or if you have a lot of readings over 200.  Please increase the levemir to 20 units daily, until you use it up.  Then please change to lantus 20 units daily. continue the same diabetes pills  Please come back for a follow-up appointment in 2 months.

## 2017-01-11 ENCOUNTER — Other Ambulatory Visit: Payer: Self-pay | Admitting: Internal Medicine

## 2017-01-11 ENCOUNTER — Other Ambulatory Visit (HOSPITAL_COMMUNITY)
Admission: RE | Admit: 2017-01-11 | Discharge: 2017-01-11 | Disposition: A | Discharge status: Home / Self Care | Payer: BLUE CROSS/BLUE SHIELD | Source: Ambulatory Visit | Attending: Internal Medicine | Admitting: Internal Medicine

## 2017-01-11 DIAGNOSIS — E1121 Type 2 diabetes mellitus with diabetic nephropathy: Secondary | ICD-10-CM | POA: Diagnosis not present

## 2017-01-11 DIAGNOSIS — Z01419 Encounter for gynecological examination (general) (routine) without abnormal findings: Secondary | ICD-10-CM | POA: Insufficient documentation

## 2017-01-11 DIAGNOSIS — Z1231 Encounter for screening mammogram for malignant neoplasm of breast: Secondary | ICD-10-CM

## 2017-01-11 DIAGNOSIS — Z124 Encounter for screening for malignant neoplasm of cervix: Secondary | ICD-10-CM | POA: Diagnosis not present

## 2017-01-11 DIAGNOSIS — M791 Myalgia: Secondary | ICD-10-CM | POA: Diagnosis not present

## 2017-01-11 DIAGNOSIS — M1990 Unspecified osteoarthritis, unspecified site: Secondary | ICD-10-CM | POA: Diagnosis not present

## 2017-01-11 DIAGNOSIS — E785 Hyperlipidemia, unspecified: Secondary | ICD-10-CM | POA: Diagnosis not present

## 2017-01-13 LAB — CYTOLOGY - PAP: Diagnosis: NEGATIVE

## 2017-01-18 ENCOUNTER — Ambulatory Visit
Admission: RE | Admit: 2017-01-18 | Discharge: 2017-01-18 | Disposition: A | Discharge status: Home / Self Care | Payer: BLUE CROSS/BLUE SHIELD | Source: Ambulatory Visit | Attending: Internal Medicine | Admitting: Internal Medicine

## 2017-01-18 DIAGNOSIS — Z1231 Encounter for screening mammogram for malignant neoplasm of breast: Secondary | ICD-10-CM | POA: Diagnosis not present

## 2017-02-28 ENCOUNTER — Ambulatory Visit: Payer: BLUE CROSS/BLUE SHIELD | Admitting: Endocrinology

## 2017-03-30 ENCOUNTER — Encounter: Payer: Self-pay | Admitting: Endocrinology

## 2017-03-30 ENCOUNTER — Ambulatory Visit (INDEPENDENT_AMBULATORY_CARE_PROVIDER_SITE_OTHER): Payer: BLUE CROSS/BLUE SHIELD | Admitting: Endocrinology

## 2017-03-30 VITALS — BP 122/80 | HR 77 | Wt 258.0 lb

## 2017-03-30 DIAGNOSIS — Z23 Encounter for immunization: Secondary | ICD-10-CM | POA: Diagnosis not present

## 2017-03-30 DIAGNOSIS — E1142 Type 2 diabetes mellitus with diabetic polyneuropathy: Secondary | ICD-10-CM | POA: Diagnosis not present

## 2017-03-30 LAB — POCT GLYCOSYLATED HEMOGLOBIN (HGB A1C): Hemoglobin A1C: 10.1

## 2017-03-30 MED ORDER — INSULIN GLARGINE 100 UNIT/ML SOLOSTAR PEN: 20.0000 [IU] | PEN_INJECTOR | Freq: Every day | SUBCUTANEOUS | 99 refills | Status: DC

## 2017-03-30 NOTE — Patient Instructions (Addendum)
check your blood sugar once a day.  vary the time of day when you check, between before the 3 meals, and at bedtime.  also check if you have symptoms of your blood sugar being too high or too low.  please keep a record of the readings and bring it to your next appointment here (or you can bring the meter itself).  You can write it on any piece of paper.  please call us sooner if your blood sugar goes below 70, or if you have a lot of readings over 200.  Please increase the levemir to 20 units daily, until you use it up.  Then please change to lantus 20 units daily.  Here is a prescription.   continue the same diabetes pills.   Please come back for a follow-up appointment in 2 months.

## 2017-03-30 NOTE — Progress Notes (Signed)
Subjective:    Patient ID: Shannon Oliver, female    DOB: [birth year], 56 y.o.   MRN: [id number]  HPI Pt returns for f/u of diabetes mellitus: DM type: Insulin-requiring type 2. Dx'ed: 8921 Complications: polyneuropathy. Therapy: insulin since 2018, and 2 oral meds GDM: 1999 DKA: never Severe hypoglycemia: never Pancreatitis: never Pancreatic imaging: never Other: she works 3rd shift; she says depression complicates the rx of her DM; she is on qd insulin, at least for now.   Interval history: She takes levemir, amaryl, and metformin.  no cbg record, but states cbg's are in the 200's.  She sometimes misses the levemir.  Ins no longer pays for levemir.  She has 1 pen remaining.   Past Medical History:  Diagnosis Date  . Abnormal EKG    LVH with strain  . Acid reflux   . Diabetes mellitus    A1C over 9  . HLD (hyperlipidemia)   . Hypertension   . Noncompliance   . Obesity     Past Surgical History:  Procedure Laterality Date  . COLONOSCOPY WITH PROPOFOL N/A 04/29/2015   Procedure: COLONOSCOPY WITH PROPOFOL;  Surgeon: Garlan Fair, MD;  Location: [facility];  Service: Endoscopy;  Laterality: N/A;  . KNEE ARTHROSCOPY W/ MENISCAL REPAIR Right     Social History   Social History  . Marital status: Married    Spouse name: N/A  . Number of children: N/A  . Years of education: N/A   Occupational History  . Not on file.   Social History Main Topics  . Smoking status: Never Smoker  . Smokeless tobacco: Never Used  . Alcohol use No  . Drug use: No  . Sexual activity: Not on file   Other Topics Concern  . Not on file   Social History Narrative  . No narrative on file    Current Outpatient Prescriptions on File Prior to Visit  Medication Sig Dispense Refill  . acetaminophen (TYLENOL) 500 MG tablet Take 500-1,000 mg by mouth every 6 (six) hours as needed for mild pain.    Marland Kitchen amLODipine (NORVASC) 5 MG tablet Take 5 mg by mouth at bedtime.     Marland Kitchen glimepiride  (AMARYL) 2 MG tablet Take 2 mg by mouth daily before breakfast.    . losartan-hydrochlorothiazide (HYZAAR) 100-25 MG per tablet Take 1 tablet by mouth every morning.     Marland Kitchen MELATONIN PO Take by mouth.    . metFORMIN (GLUCOPHAGE) 500 MG tablet Take 500 mg by mouth 2 (two) times daily with a meal.    . methocarbamol (ROBAXIN-750) 750 MG tablet Take 1 tablet (750 mg total) by mouth 4 (four) times daily. 60 tablet 0  . metoprolol tartrate (LOPRESSOR) 25 MG tablet Take 25 mg by mouth 2 (two) times daily.    . Multiple Vitamins-Calcium (ONE-A-DAY WOMENS PO) Take 1 tablet by mouth daily.    . simvastatin (ZOCOR) 10 MG tablet Take 10 mg by mouth daily.     No current facility-administered medications on file prior to visit.     Allergies  Allergen Reactions  . Hydrocodone Hypertension    Family History  Problem Relation Age of Onset  . Diabetes Neg Hx     BP 122/80   Pulse 77   Wt 258 lb (117 kg)   SpO2 96%   BMI 42.28 kg/m    Review of Systems She denies hypoglycemia    Objective:   Physical Exam VITAL SIGNS:  See vs page GENERAL:  no distress Pulses: foot pulses are intact bilaterally.   MSK: no deformity of the feet or ankles.  CV: trace bilat edema of the legs.  Skin:  no ulcer on the feet or ankles.  normal color and temp on the feet and ankles.   Neuro: sensation is intact to touch on the feet and ankles, but decreased from normal.      Lab Results  Component Value Date   HGBA1C 10.1 03/30/2017      Assessment & Plan:  Insulin-requiring type 2 DM, with polyneuropathy: worse Noncompliance with cbg recording and insulin: she is not a candidate for multiple daily injections.  Patient Instructions  check your blood sugar once a day.  vary the time of day when you check, between before the 3 meals, and at bedtime.  also check if you have symptoms of your blood sugar being too high or too low.  please keep a record of the readings and bring it to your next appointment here  (or you can bring the meter itself).  You can write it on any piece of paper.  please call us sooner if your blood sugar goes below 70, or if you have a lot of readings over 200.  Please increase the levemir to 20 units daily, until you use it up.  Then please change to lantus 20 units daily.  Here is a prescription.   continue the same diabetes pills.   Please come back for a follow-up appointment in 2 months.

## 2017-05-30 ENCOUNTER — Encounter (HOSPITAL_COMMUNITY): Payer: Self-pay | Admitting: *Deleted

## 2017-05-30 ENCOUNTER — Inpatient Hospital Stay (HOSPITAL_COMMUNITY)
Admission: AD | Admit: 2017-05-30 | Discharge: 2017-05-30 | Disposition: A | Discharge status: Home / Self Care | Payer: BLUE CROSS/BLUE SHIELD | Source: Ambulatory Visit | Attending: Obstetrics & Gynecology | Admitting: Obstetrics & Gynecology

## 2017-05-30 ENCOUNTER — Other Ambulatory Visit (HOSPITAL_COMMUNITY)
Admission: RE | Admit: 2017-05-30 | Discharge: 2017-05-30 | Disposition: A | Discharge status: Home / Self Care | Payer: BLUE CROSS/BLUE SHIELD | Source: Ambulatory Visit | Attending: Family Medicine | Admitting: Family Medicine

## 2017-05-30 ENCOUNTER — Ambulatory Visit (HOSPITAL_COMMUNITY)
Admission: EM | Admit: 2017-05-30 | Discharge: 2017-05-30 | Disposition: A | Discharge status: Home / Self Care | Payer: BLUE CROSS/BLUE SHIELD | Source: Home / Self Care

## 2017-05-30 ENCOUNTER — Other Ambulatory Visit: Payer: Self-pay

## 2017-05-30 ENCOUNTER — Ambulatory Visit (INDEPENDENT_AMBULATORY_CARE_PROVIDER_SITE_OTHER): Payer: BLUE CROSS/BLUE SHIELD | Admitting: Family Medicine

## 2017-05-30 VITALS — BP 153/90 | HR 65 | Wt 256.0 lb

## 2017-05-30 DIAGNOSIS — Z113 Encounter for screening for infections with a predominantly sexual mode of transmission: Secondary | ICD-10-CM

## 2017-05-30 DIAGNOSIS — N950 Postmenopausal bleeding: Secondary | ICD-10-CM | POA: Insufficient documentation

## 2017-05-30 DIAGNOSIS — N939 Abnormal uterine and vaginal bleeding, unspecified: Secondary | ICD-10-CM | POA: Diagnosis not present

## 2017-05-30 NOTE — Progress Notes (Signed)
Endometrial biopsy done and pt tolerated well.

## 2017-05-30 NOTE — MAU Note (Signed)
Urine in lab 

## 2017-05-30 NOTE — MAU Note (Signed)
Hasn't had a menstrual cycl in over 2 yrs.  Started bleeding last Wed, heavy with clots.  Having bad cramps and nausea.

## 2017-05-30 NOTE — Progress Notes (Signed)
Almost 3 yrs since last period. Last Weds started having heavy bleeding. Some days bleeding heavier than others.  Today has been heavier day. Used 3 heavier pads for incontinence today. Mostly smaller clots but "liver" size on occ. Cramping and lower back pain

## 2017-05-30 NOTE — Progress Notes (Signed)
GYNECOLOGY OFFICE VISIT NOTE  History:  56 y.o.  G3P3 female with here today for postmenopausal bleeding. She reports it had been almost 3 years since her LMP, then started having vaginal bleeding 5 days ago, which got heavier, and since then it has been on/off heavy. Also reports cramping, low back pain, and nausea. Denies fever. Endorses abnormal vaginal discharge prior to onset of bleeding. She reports being sexually active with her husband of 18 years. She reports that prior to menopause, her bleeding was somewhat irregular and heavy.    Past Medical History:  Diagnosis Date  . Abnormal EKG    LVH with strain  . Acid reflux   . Diabetes mellitus    A1C over 9  . HLD (hyperlipidemia)   . Hypertension   . Noncompliance   . Obesity     Past Surgical History:  Procedure Laterality Date  . COLONOSCOPY WITH PROPOFOL N/A 04/29/2015   Procedure: COLONOSCOPY WITH PROPOFOL;  Surgeon: Garlan Fair, MD;  Location: [facility];  Service: Endoscopy;  Laterality: N/A;  . KNEE ARTHROSCOPY W/ MENISCAL REPAIR Right     The following portions of the patient's history were reviewed and updated as appropriate: allergies, current medications, past family history, past medical history, past social history, past surgical history and problem list.   Health Maintenance:  Normal pap on 01/11/2017.  Normal mammogram on 01/18/2017.   Review of Systems:  Pertinent items noted in HPI and remainder of comprehensive ROS otherwise negative.   Objective:  Physical Exam BP (!) 153/90 (BP Location: Left Arm, Patient Position: Sitting, Cuff Size: Large)   Pulse 65   Wt 256 lb (116.1 kg)   LMP 08/02/2013 Comment: spotting  BMI 41.95 kg/m  CONSTITUTIONAL: Well-developed, well-nourished female in no acute distress.  HENT:  Normocephalic, atraumatic. External right and left ear normal. Oropharynx is clear and moist EYES: Conjunctivae and EOM are normal. Pupils are equal, round, and reactive to light. No  scleral icterus.  NECK: Normal range of motion, supple, no masses SKIN: Skin is warm and dry. No rash noted. Not diaphoretic. No erythema. No pallor. NEUROLOGIC: Alert and oriented to person, place, and time. Normal reflexes, muscle tone coordination. No cranial nerve deficit noted. PSYCHIATRIC: Normal mood and affect. Normal behavior. Normal judgment and thought content. CARDIOVASCULAR: Normal heart rate noted RESPIRATORY: Effort and breath sounds normal, no problems with respiration noted ABDOMEN: Soft, no distention noted.   PELVIC: Normal appearing external genitalia; normal appearing vaginal mucosa and cervix. Initially small amount of blood in vaginal vault. With manipulation during endometrial biopsy, moderate amount of blood coming from cervical os.  Normal uterine size, no other palpable masses, no uterine or adnexal tenderness. MUSCULOSKELETAL: Normal range of motion. No edema noted.  Labs and Imaging No results found.  Assessment & Plan:  Postmenopausal bleeding - Dicussed with patient potential causes and ddx - Endometrial biopsy done today. See procedure note - Last pap smear reviewed (NILM on 01/11/2017) - Cultures collected to r/o infection - Check CBC and TSH - GYN ultrasound ordered  Routine preventative health maintenance measures emphasized. Please refer to After Visit Summary for other counseling recommendations.   Return in about 2 weeks (around 06/13/2017) for Follow up on Biopsy and U/S results.  Total face-to-face time with patient: over 30 minutes. Over 50% of encounter was spent on counseling and coordination of care.  Almyra Free P. Degele, MD OB Fellow [facility]   PROCEDURE NOTE ENDOMETRIAL BIOPSY  The indications for endometrial biopsy were reviewed.   Risks of the biopsy including cramping, bleeding, infection, uterine perforation, inadequate specimen and need for additional procedures  were discussed. The patient states she  understands and agrees to undergo procedure today. Consent was signed. Time out was performed. Urine HCG was negative. During the pelvic exam, the cervix was prepped with Betadine. A single-toothed tenaculum was placed on the anterior lip of the cervix to stabilize it. The 3 mm pipelle was introduced into the endometrial cavity with some difficulty to a depth of 5 cm; sound was used as well and unable to get past 5 cm; small amount of tissue was obtained and sent to pathology. The instruments were removed from the patient's vagina. Minimal bleeding from the cervix was noted. The patient tolerated the procedure well. Routine post-procedure instructions were given to the patient.    Almyra Free P. Degele, MD OB Fellow

## 2017-05-30 NOTE — MAU Provider Note (Signed)
Shannon Oliver is a non pregnant female here in MAU with heavy vaginal bleeding. States she has not had a period in over 3 years and started bleeding 2 weeks ago. States she is bleeding heavily. Has occasional dizziness, none now while standing.  Patient has waited in the triage for over and hour. Discussed flow of MAU and offered patient to walk down to the clinic for a walk in GYN visit. Patient states she does not want to wait any longer in the MAU and agreed to walk down to the walk in GYN clinic. Patient escorted down to the clinic by NP.   Vitals:   05/30/17 1525  BP: (!) 158/90  Pulse: 66  Resp: 18  Temp: 97.8 F (36.6 C)  SpO2: 99%    Rasch, Artist Pais, NP 05/30/2017 4:42 PM

## 2017-05-31 LAB — CBC WITH DIFFERENTIAL/PLATELET
Basophils Absolute: 0 10*3/uL (ref 0.0–0.2)
Basos: 1 %
EOS (ABSOLUTE): 0.2 10*3/uL (ref 0.0–0.4)
Eos: 2 %
Hematocrit: 36.3 % (ref 34.0–46.6)
Hemoglobin: 12 g/dL (ref 11.1–15.9)
Immature Grans (Abs): 0 10*3/uL (ref 0.0–0.1)
Immature Granulocytes: 0 %
Lymphocytes Absolute: 3.2 10*3/uL — ABNORMAL HIGH (ref 0.7–3.1)
Lymphs: 45 %
MCH: 29.7 pg (ref 26.6–33.0)
MCHC: 33.1 g/dL (ref 31.5–35.7)
MCV: 90 fL (ref 79–97)
Monocytes Absolute: 0.7 10*3/uL (ref 0.1–0.9)
Monocytes: 10 %
Neutrophils Absolute: 2.9 10*3/uL (ref 1.4–7.0)
Neutrophils: 42 %
Platelets: 248 10*3/uL (ref 150–379)
RBC: 4.04 x10E6/uL (ref 3.77–5.28)
RDW: 13.7 % (ref 12.3–15.4)
WBC: 7 10*3/uL (ref 3.4–10.8)

## 2017-05-31 LAB — TSH: TSH: 2.14 u[IU]/mL (ref 0.450–4.500)

## 2017-06-01 ENCOUNTER — Ambulatory Visit: Payer: BLUE CROSS/BLUE SHIELD | Admitting: Endocrinology

## 2017-06-01 LAB — CERVICOVAGINAL ANCILLARY ONLY
Bacterial vaginitis: NEGATIVE
CHLAMYDIA, DNA PROBE: NEGATIVE
Candida vaginitis: NEGATIVE
NEISSERIA GONORRHEA: NEGATIVE
Trichomonas: NEGATIVE

## 2017-06-05 DIAGNOSIS — N950 Postmenopausal bleeding: Secondary | ICD-10-CM | POA: Insufficient documentation

## 2017-06-05 DIAGNOSIS — N939 Abnormal uterine and vaginal bleeding, unspecified: Secondary | ICD-10-CM | POA: Insufficient documentation

## 2017-06-08 ENCOUNTER — Telehealth: Payer: Self-pay | Admitting: *Deleted

## 2017-06-08 ENCOUNTER — Encounter: Payer: Self-pay | Admitting: *Deleted

## 2017-06-08 NOTE — Telephone Encounter (Signed)
-----   Message from Gailen Shelter, MD sent at 06/05/2017 11:09 PM EST ----- Regarding: GYN Ultrasound I saw this patient last week and meant to order pelvic ultrasound. I was reviewing her chart tonight and realized this has not been done. I have placed the order. Can someone please call and schedule? Thank you!

## 2017-06-08 NOTE — Telephone Encounter (Signed)
Scheduled [place] and called patient home number , left message with female we were trying to reach [place] with information, please ask her to call us. Also called mobile number and left message we have scheduled [place] for 06/13/17 3:00 - please call us for details or questions. Will send my chart message.

## 2017-06-13 ENCOUNTER — Ambulatory Visit (HOSPITAL_COMMUNITY): Payer: BLUE CROSS/BLUE SHIELD

## 2017-06-15 ENCOUNTER — Ambulatory Visit (INDEPENDENT_AMBULATORY_CARE_PROVIDER_SITE_OTHER): Payer: BLUE CROSS/BLUE SHIELD | Admitting: Family Medicine

## 2017-06-15 ENCOUNTER — Telehealth: Payer: Self-pay | Admitting: *Deleted

## 2017-06-15 ENCOUNTER — Ambulatory Visit (HOSPITAL_COMMUNITY)
Admission: RE | Admit: 2017-06-15 | Discharge: 2017-06-15 | Disposition: A | Discharge status: Home / Self Care | Payer: BLUE CROSS/BLUE SHIELD | Source: Ambulatory Visit | Attending: Family Medicine | Admitting: Family Medicine

## 2017-06-15 ENCOUNTER — Encounter: Payer: Self-pay | Admitting: Family Medicine

## 2017-06-15 VITALS — BP 182/98 | HR 79 | Ht 66.0 in | Wt 274.0 lb

## 2017-06-15 DIAGNOSIS — N950 Postmenopausal bleeding: Secondary | ICD-10-CM

## 2017-06-15 DIAGNOSIS — I10 Essential (primary) hypertension: Secondary | ICD-10-CM | POA: Diagnosis not present

## 2017-06-15 DIAGNOSIS — D259 Leiomyoma of uterus, unspecified: Secondary | ICD-10-CM | POA: Diagnosis not present

## 2017-06-15 IMAGING — US US PELVIS COMPLETE
1 series · 15 of 25 positions shown · non-contrast
Comparison: None

CLINICAL DATA: Postmenopausal bleeding.

EXAM:
TRANSABDOMINAL AND TRANSVAGINAL ULTRASOUND OF PELVIS
TECHNIQUE: Both transabdominal and transvaginal ultrasound examinations of the
pelvis were performed. Transabdominal technique was performed for
global imaging of the pelvis including uterus, ovaries, adnexal
regions, and pelvic cul-de-sac. It was necessary to proceed with
endovaginal exam following the transabdominal exam to visualize the
endometrium and ovaries.

[Series 1: us pelvis complete · 15 of 77 slices shown]
[im 1/77]
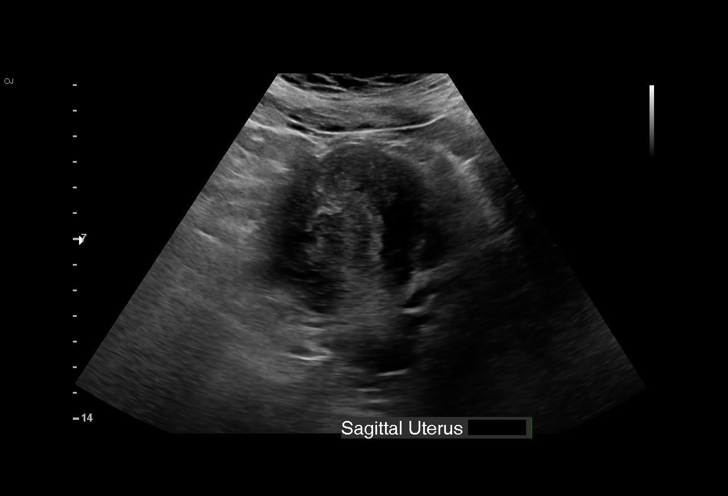
[im 7/77]
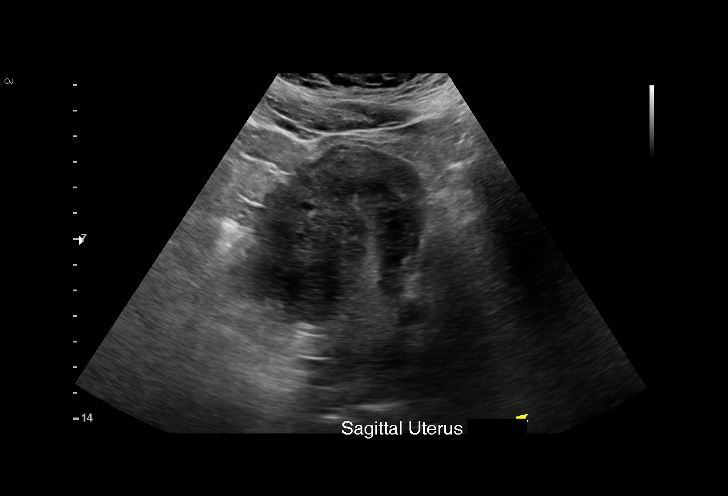
[im 13/77]
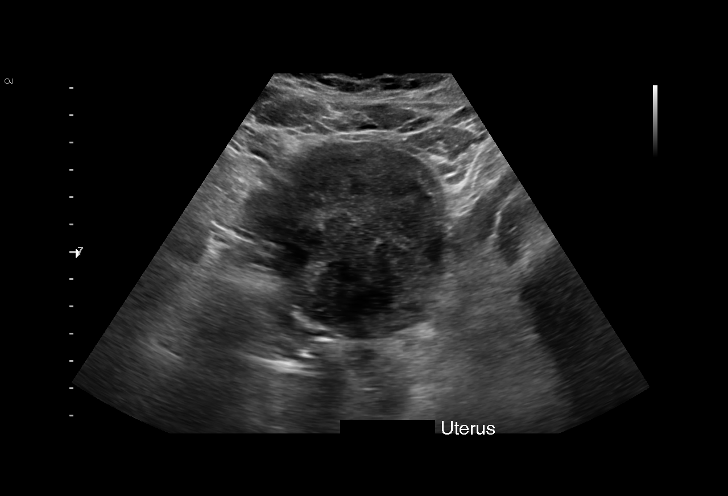
[im 16/77]
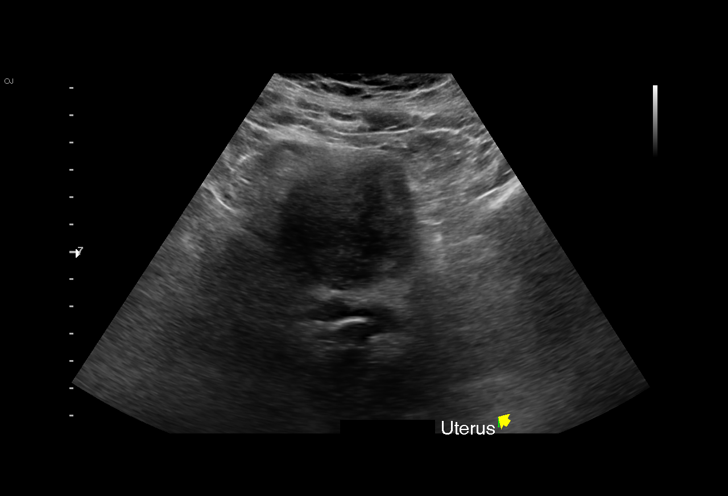
[im 23/77]
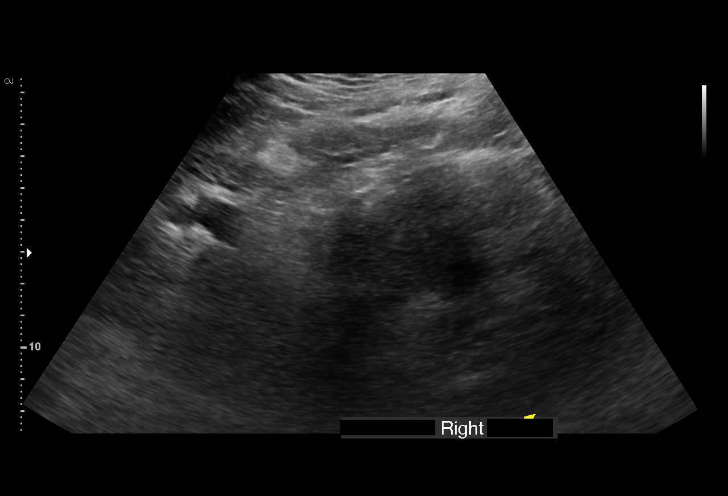
[im 29/77]
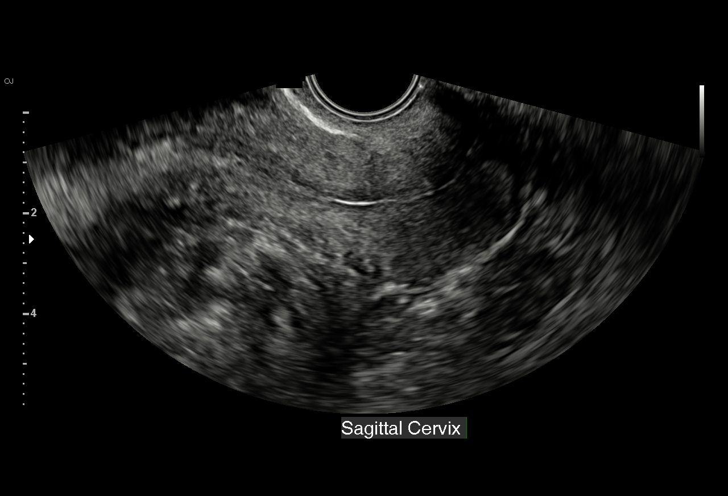
[im 32/77]
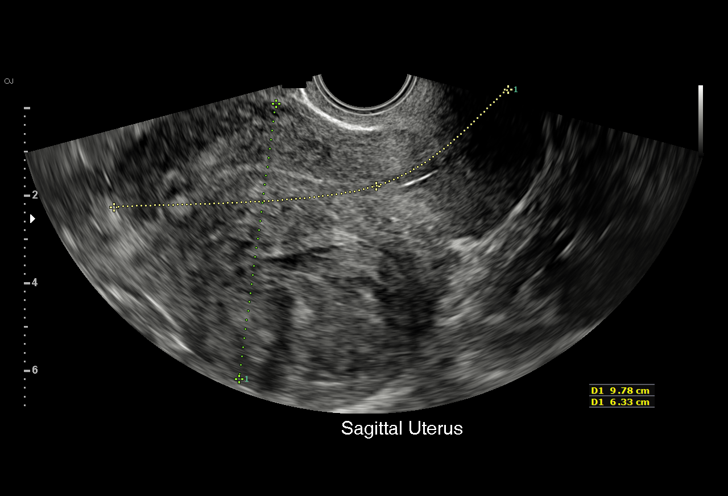
[im 39/77]
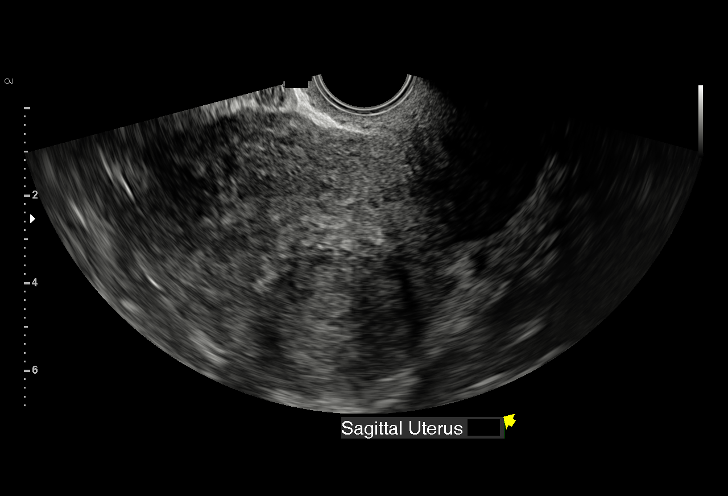
[im 45/77]
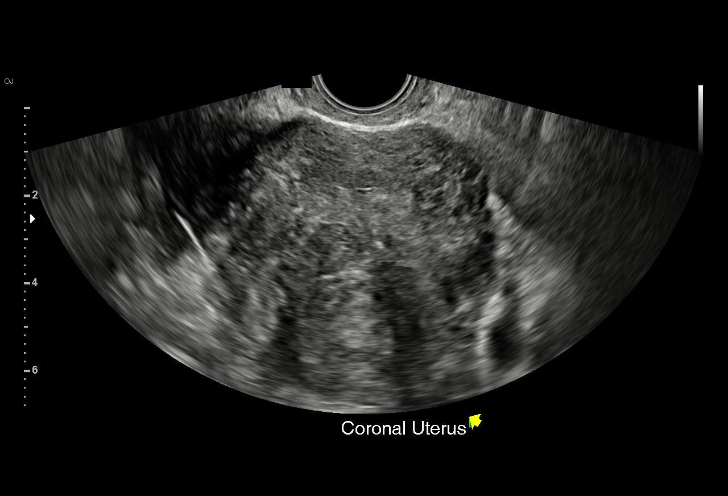
[im 48/77]
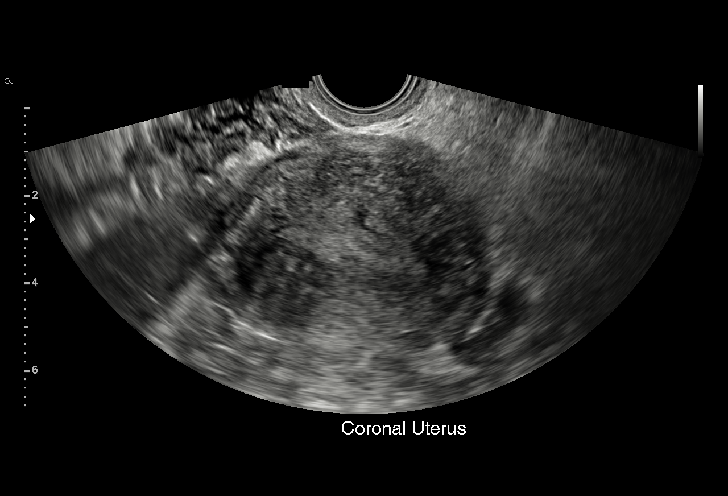
[im 54/77]
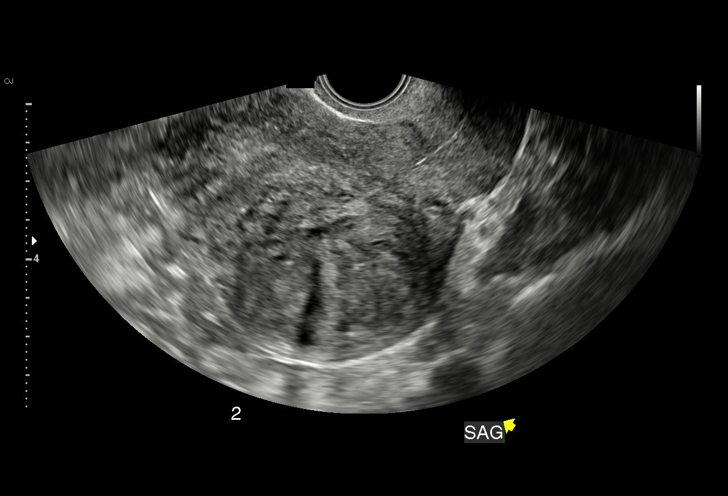
[im 61/77]
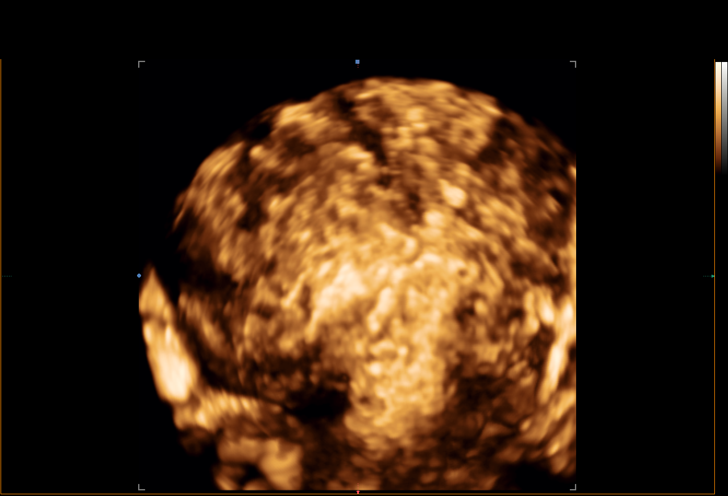
[im 64/77]
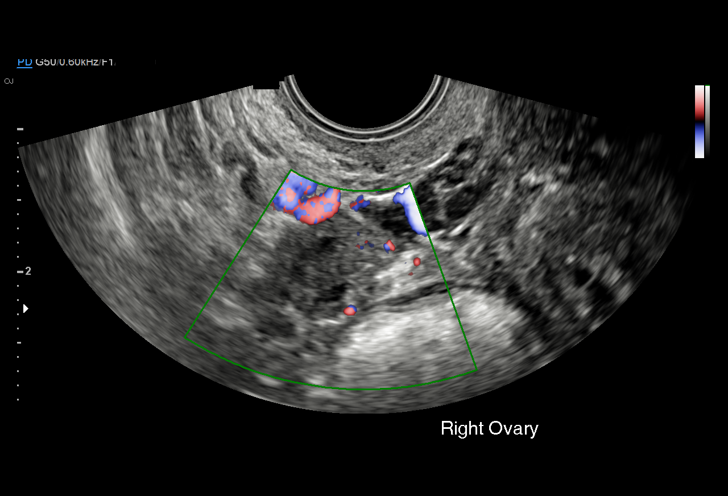
[im 70/77]
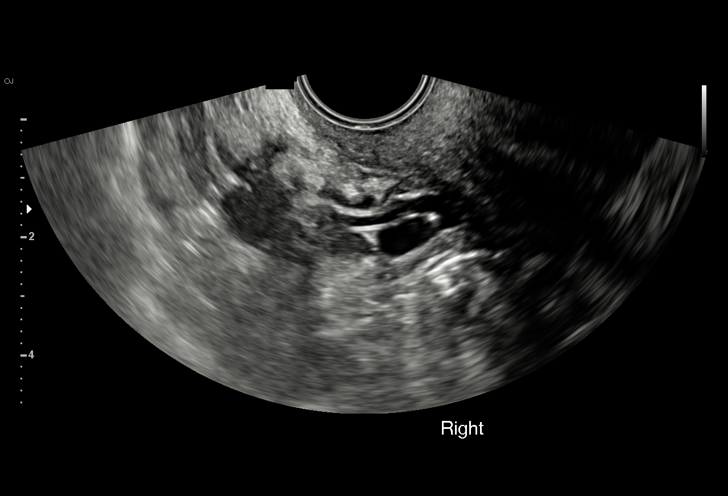
[im 77/77]
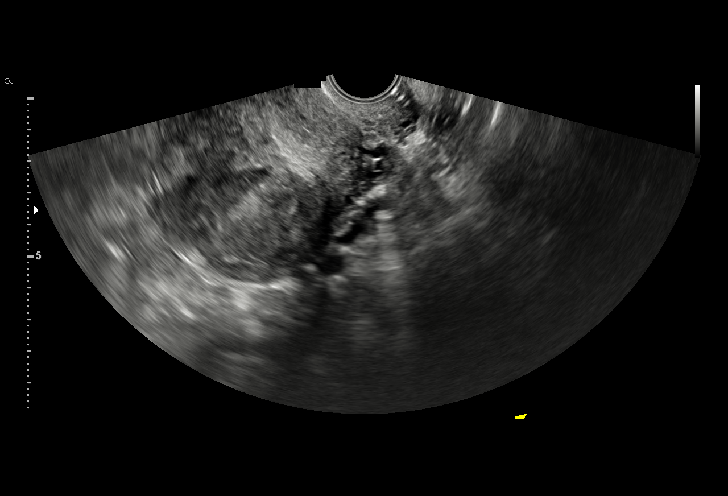

[15 of 25 positions shown; findings below may reference images not displayed]

FINDINGS: Uterus

Measurements: 9.8 x 6.1 x 6.7 cm. A subserosal fibroid is seen in
the posterior corpus measuring 5.7 cm. A smaller intramural fibroid
is seen in the right posterior fundus measuring 2.1 cm.

Endometrium

Thickness: 8 mm.  No focal abnormality visualized.

Right ovary

Measurements: 1.8 x 1.4 x 1.2 cm. Normal appearance/no adnexal mass.

Left ovary

Measurements: 1.9 x 1.3 x 1.6 cm. Normal appearance/no adnexal mass.

Other findings

No abnormal free fluid.
IMPRESSION: Two posterior uterine fibroids, largest measuring 5.7 cm.

Endometrial thickness measures 8 mm. In the setting of
post-menopausal bleeding, endometrial sampling is indicated to
exclude carcinoma. If results are benign, sonohysterogram should be
considered for focal lesion work-up. (Ref: Radiological Reasoning:
Algorithmic Workup of Abnormal Vaginal Bleeding with Endovaginal
Sonography and Sonohysterography. AJR [A2]; 191:S68-73).

Normal appearance of both ovaries.

## 2017-06-15 NOTE — Telephone Encounter (Signed)
Phyliss left  A voice message 06/14/17 pm stating she needs to know test results and when her MRI is scheduled. Per chart review was seen in office this am. Discussed with Dr. Lambert Keto and she reviewed test results and when MRI is scheduled with the patient.

## 2017-06-16 ENCOUNTER — Encounter: Payer: Self-pay | Admitting: Family Medicine

## 2017-06-16 NOTE — Addendum Note (Signed)
Addended by: Gailen Shelter on: 06/16/2017 02:44 PM   Modules accepted: Level of Service

## 2017-06-16 NOTE — Progress Notes (Addendum)
GYNECOLOGY OFFICE VISIT NOTE  History:  56 y.o.  G3P3 female with here today for follow up. Seen 2 weeks ago for postmenopausal bleeding. She is to follow up on endometrial biopsy and ultrasound results. However, she re-schedule her ultrasound and this won't be done until later this afternoon. She reports her bleeding has resolved. Denies any new concerns today.   Past Medical History:  Diagnosis Date  . Abnormal EKG    LVH with strain  . Acid reflux   . Diabetes mellitus    A1C over 9  . HLD (hyperlipidemia)   . Hypertension   . Noncompliance   . Obesity     Past Surgical History:  Procedure Laterality Date  . COLONOSCOPY WITH PROPOFOL N/A 04/29/2015   Procedure: COLONOSCOPY WITH PROPOFOL;  Surgeon: Garlan Fair, MD;  Location: [facility];  Service: Endoscopy;  Laterality: N/A;  . KNEE ARTHROSCOPY W/ MENISCAL REPAIR Right     The following portions of the patient's history were reviewed and updated as appropriate: allergies, current medications, past family history, past medical history, past social history, past surgical history and problem list.   Health Maintenance:  Normal pap on 01/11/2017.  Normal mammogram on 01/18/2017.   Review of Systems:  Pertinent items noted in HPI and remainder of comprehensive ROS otherwise negative. Denies headache, visual disturbances, N/V, chest pain, SOB, dizziness, lightheadedness, or focal weakness.   Objective:  Physical Exam BP (!) 182/98   Pulse 79   Ht 5\' 6"  (1.676 m)   Wt 274 lb (124.3 kg)   LMP 08/02/2013 Comment: spotting  BMI 44.22 kg/m  CONSTITUTIONAL: Well-developed, well-nourished female in no acute distress.  HENT:  Normocephalic, atraumatic.Oropharynx is clear and moist EYES: Conjunctivae and EOM are normal. No scleral icterus.  NECK: Normal range of motion SKIN: Skin is warm and dry.Marland Kitchen NEUROLOGIC: Alert and oriented to person, place, and time. Normal reflexes, muscle tone coordination. No cranial nerve deficit  noted. PSYCHIATRIC: Normal mood and affect. Normal behavior. Normal judgment and thought content. CARDIOVASCULAR: Normal heart rate noted RESPIRATORY: Effort and breath sounds normal, no problems with respiration noted MUSCULOSKELETAL: Normal range of motion. No edema noted.  Labs reviewed: Results for orders placed or performed in visit on 05/30/17  CBC w/Diff  Result Value Ref Range   WBC 7.0 3.4 - 10.8 x10E3/uL   RBC 4.04 3.77 - 5.28 x10E6/uL   Hemoglobin 12.0 11.1 - 15.9 g/dL   Hematocrit 36.3 34.0 - 46.6 %   MCV 90 79 - 97 fL   MCH 29.7 26.6 - 33.0 pg   MCHC 33.1 31.5 - 35.7 g/dL   RDW 13.7 12.3 - 15.4 %   Platelets 248 150 - 379 x10E3/uL   Neutrophils 42 Not Estab. %   Lymphs 45 Not Estab. %   Monocytes 10 Not Estab. %   Eos 2 Not Estab. %   Basos 1 Not Estab. %   Neutrophils Absolute 2.9 1.4 - 7.0 x10E3/uL   Lymphocytes Absolute 3.2 (H) 0.7 - 3.1 x10E3/uL   Monocytes Absolute 0.7 0.1 - 0.9 x10E3/uL   EOS (ABSOLUTE) 0.2 0.0 - 0.4 x10E3/uL   Basophils Absolute 0.0 0.0 - 0.2 x10E3/uL   Immature Granulocytes 0 Not Estab. %   Immature Grans (Abs) 0.0 0.0 - 0.1 x10E3/uL  TSH  Result Value Ref Range   TSH 2.140 0.450 - 4.500 uIU/mL  Cervicovaginal ancillary only  Result Value Ref Range   Bacterial vaginitis Negative for Bacterial Vaginitis Microorganisms    Candida  vaginitis Negative for Candida species    Chlamydia Negative    Neisseria gonorrhea Negative    Trichomonas Negative    Pathology results reviewed; Endometrial biopsy (05/30/17): - SCANTY ENDOMETRIUM WITH ATYPIA AND FEATURES OF BREAKDOWN.  Assessment & Plan:  Postmenopausal bleeding - Dicussed results with patient, and that endometrial biopsy yielded scanty endometrium tissue (likely due to cervical stenosis), but noted atypia. Discussed needs to further obtain better biopsy. Discussed with Dr. Elly Modena, who recommends proceeding with D&C. Discussed this with patient and she is agreeable. Will schedule  pre-op visit with a GYN provider - U/S to be completed this afternoon (ufortunately patient re-scheduled it and it was not done prior to visit today).  Hypertension BP significantly elevate here, but patient is asymptomatic. Pt reports she has not taken her BP meds this morning. She is on 4 BP meds.  - Emphasize importance of compliance with BP meds and getting BP under control.  - Discussed precautions to go to an emergency department for any symptoms of hypertensive emergency.   Total face-to-face time with patient: over 25 minutes. Over 50% of encounter was spent on counseling and coordination of care.  Almyra Free P. Mar Walmer, MD [facility] for [employer]

## 2017-07-18 ENCOUNTER — Ambulatory Visit: Payer: BLUE CROSS/BLUE SHIELD | Admitting: Obstetrics and Gynecology

## 2017-07-18 ENCOUNTER — Encounter: Payer: Self-pay | Admitting: Obstetrics and Gynecology

## 2017-07-18 VITALS — BP 157/77 | HR 74 | Wt 262.0 lb

## 2017-07-18 DIAGNOSIS — N950 Postmenopausal bleeding: Secondary | ICD-10-CM

## 2017-07-18 NOTE — Progress Notes (Signed)
GYNECOLOGY OFFICE VISIT NOTE  History:  57 y.o. [id number] who is here for post menopausal bleeding. LMP 3 years ago. Had heavy bleeding in 05/2017 which has since tapered off. She is now having a little bit of daily spotting. No other complaints.  Past Medical History:  Diagnosis Date  . Abnormal EKG    LVH with strain  . Acid reflux   . Diabetes mellitus    A1C over 9  . HLD (hyperlipidemia)   . Hypertension   . Noncompliance   . Obesity    Past Surgical History:  Procedure Laterality Date  . COLONOSCOPY WITH PROPOFOL N/A 04/29/2015   Procedure: COLONOSCOPY WITH PROPOFOL;  Surgeon: Garlan Fair, MD;  Location: [facility];  Service: Endoscopy;  Laterality: N/A;  . KNEE ARTHROSCOPY W/ MENISCAL REPAIR Right     Current Outpatient Medications:  .  acetaminophen (TYLENOL) 500 MG tablet, Take 500-1,000 mg by mouth every 6 (six) hours as needed for mild pain., Disp: , Rfl:  .  amLODipine (NORVASC) 5 MG tablet, Take 2.5 mg by mouth at bedtime. , Disp: , Rfl:  .  glimepiride (AMARYL) 2 MG tablet, Take 2 mg by mouth daily before breakfast., Disp: , Rfl:  .  insulin detemir (LEVEMIR) 100 UNIT/ML injection, Inject 15 Units into the skin every morning., Disp: , Rfl:  .  losartan-hydrochlorothiazide (HYZAAR) 100-25 MG per tablet, Take 1 tablet by mouth every morning. , Disp: , Rfl:  .  metFORMIN (GLUCOPHAGE) 500 MG tablet, Take 500 mg by mouth 2 (two) times daily with a meal., Disp: , Rfl:  .  metoprolol tartrate (LOPRESSOR) 25 MG tablet, Take 25 mg by mouth 2 (two) times daily., Disp: , Rfl:  .  Multiple Vitamins-Calcium (ONE-A-DAY WOMENS PO), Take 1 tablet by mouth daily., Disp: , Rfl:  .  simvastatin (ZOCOR) 10 MG tablet, Take 10 mg by mouth daily., Disp: , Rfl:  .  MELATONIN PO, Take by mouth., Disp: , Rfl:  .  methocarbamol (ROBAXIN-750) 750 MG tablet, Take 1 tablet (750 mg total) by mouth 4 (four) times daily. (Patient not taking: Reported on 05/30/2017), Disp: 60 tablet, Rfl:  0  Social History   Tobacco Use  . Smoking status: Never Smoker  . Smokeless tobacco: Never Used  Substance Use Topics  . Alcohol use: No  . Drug use: No   The following portions of the patient's history were reviewed and updated as appropriate: allergies, current medications, past family history, past medical history, past social history, past surgical history and problem list.   Has taken oxycodone with no issues.  Health Maintenance:  Last pap: normal 01/2017 Last mammogram: normal 01/2017  Review of Systems:  Pertinent items noted in HPI and remainder of comprehensive ROS otherwise negative.   Objective:  Physical Exam BP (!) 157/77   Pulse 74   Wt 262 lb (118.8 kg)   LMP 08/02/2013 Comment: spotting  BMI 42.29 kg/m  CONSTITUTIONAL: Well-developed, well-nourished female in no acute distress.  HENT:  Normocephalic, atraumatic. External right and left ear normal. Oropharynx is clear and moist EYES: Conjunctivae and EOM are normal. Pupils are equal, round, and reactive to light. No scleral icterus.  NECK: Normal range of motion, supple, no masses SKIN: Skin is warm and dry. No rash noted. Not diaphoretic. No erythema. No pallor. NEUROLOGIC: Alert and oriented to person, place, and time. Normal reflexes, muscle tone coordination. No cranial nerve deficit noted. PSYCHIATRIC: Normal mood and affect. Normal behavior. Normal judgment and thought  content. CARDIOVASCULAR: Normal heart rate noted RESPIRATORY: Effort and breath sounds normal, no problems with respiration noted ABDOMEN: Soft, no distention noted.   PELVIC: Deferred MUSCULOSKELETAL: Normal range of motion. No edema noted.  Labs and Imaging No results found.  Assessment & Plan:  1. Postmenopausal bleeding Reviewed imaging and results of EMB with patient. Reviewed that there is increased concern for endometrial cancer given the thickened endometrial lining and atypia noted on EMB although it was not a sufficient  sample. Would recommend further sampling. Reviewed that this would be done via hysteroscopy D&C in the OR. Reviewed procedure, risks/benefits associated with procedure. Reviewed that this is next step to sample endometrium and this will provide improved assessment to rule out cancer. She is agreeable to proceeding with same. She will need pre-op clearance. Will have surgical scheduler call to schedule hysteroscopy D&C. She verbalized understanding of all of the above, answered all questions.    Routine preventative health maintenance measures emphasized. Please refer to After Visit Summary for other counseling recommendations.    Feliz Beam, M.D. Attending [place], [facility] for [employer], [place]

## 2017-07-18 NOTE — Progress Notes (Signed)
Offered pt opportunity to speak with [facility] personnel based on  Screening numbers. Pt declined

## 2017-07-18 NOTE — Patient Instructions (Signed)
Call your primary care doctor to tell them you need pre-op clearance for a Hysteroscopy Dilation & Curettage and they should send it to our office.

## 2017-07-21 ENCOUNTER — Encounter: Payer: Self-pay | Admitting: Obstetrics and Gynecology

## 2017-07-22 ENCOUNTER — Encounter (HOSPITAL_COMMUNITY): Payer: Self-pay

## 2017-07-29 DIAGNOSIS — R0609 Other forms of dyspnea: Secondary | ICD-10-CM | POA: Diagnosis not present

## 2017-07-29 DIAGNOSIS — E118 Type 2 diabetes mellitus with unspecified complications: Secondary | ICD-10-CM | POA: Diagnosis not present

## 2017-08-01 ENCOUNTER — Telehealth: Payer: Self-pay | Admitting: General Practice

## 2017-08-01 DIAGNOSIS — E1165 Type 2 diabetes mellitus with hyperglycemia: Secondary | ICD-10-CM | POA: Diagnosis not present

## 2017-08-01 DIAGNOSIS — R0609 Other forms of dyspnea: Secondary | ICD-10-CM | POA: Diagnosis not present

## 2017-08-01 DIAGNOSIS — E118 Type 2 diabetes mellitus with unspecified complications: Secondary | ICD-10-CM | POA: Diagnosis not present

## 2017-08-01 DIAGNOSIS — M62830 Muscle spasm of back: Secondary | ICD-10-CM | POA: Diagnosis not present

## 2017-08-01 NOTE — Telephone Encounter (Signed)
Patient called into front office stating she is at her primary care doctor and doesn't know what she should be getting done for surgery clearance. Called patient and discussed there aren't any forms we give her for that or specific tests that need to be done. She just needs to let her doctor know Dr Rosana Hoes is wanting to do "____ type of surgery" and if her PCP is clearing her health-wise for that surgery. Her doctor will make a note regarding that and you will bring a copy of your doctor's note to [place] for reference. Patient verbalized understanding & had no questions

## 2017-08-24 IMAGING — US US ABDOMEN COMPLETE
1 series · 14 of 25 positions shown · non-contrast
Comparison: None.

CLINICAL DATA: Right-sided flank pain

EXAM:
ABDOMEN ULTRASOUND COMPLETE

[Series 1: us abdomen complete · 0.15mm/px · 14 of 120 slices shown]
[im 1/120]
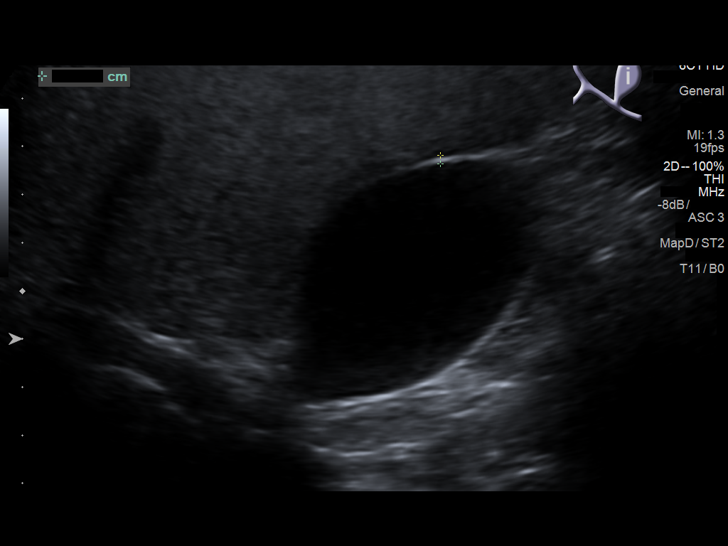
[im 10/120]
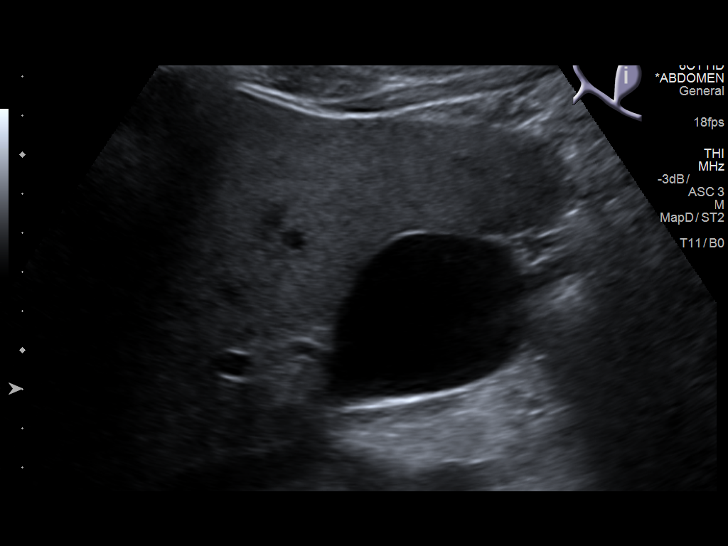
[im 20/120]
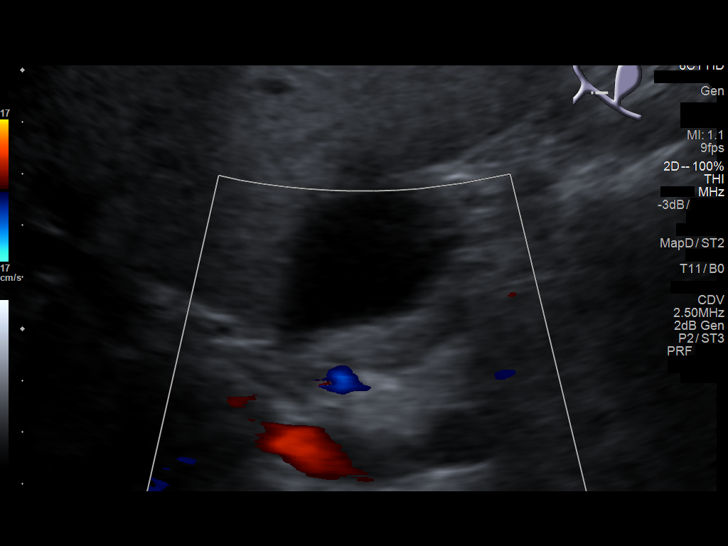
[im 30/120]
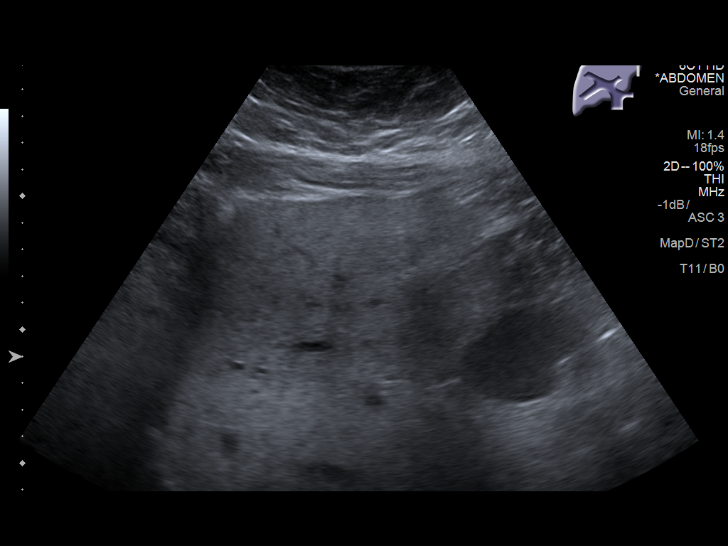
[im 40/120]
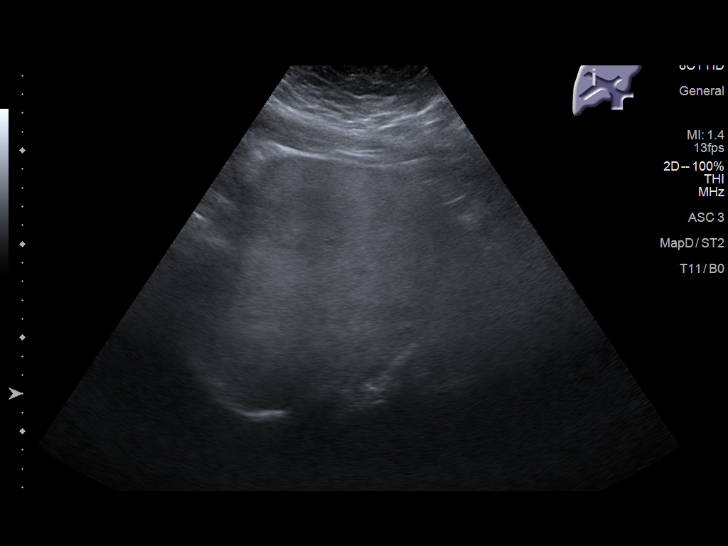
[im 45/120]
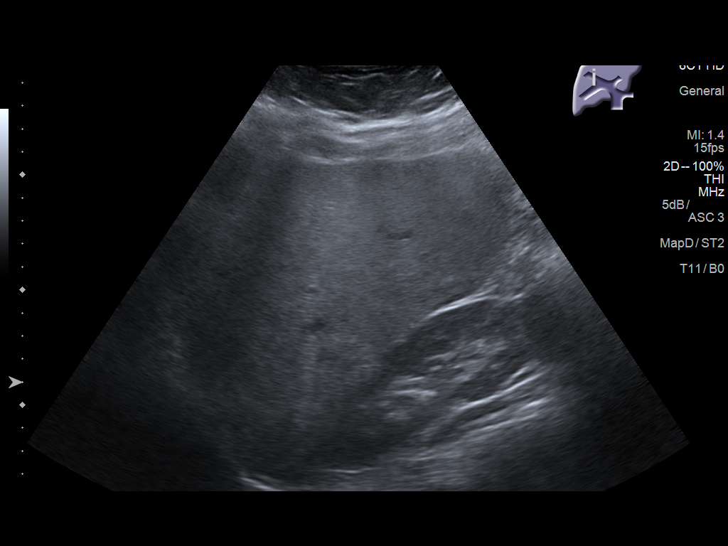
[im 55/120]
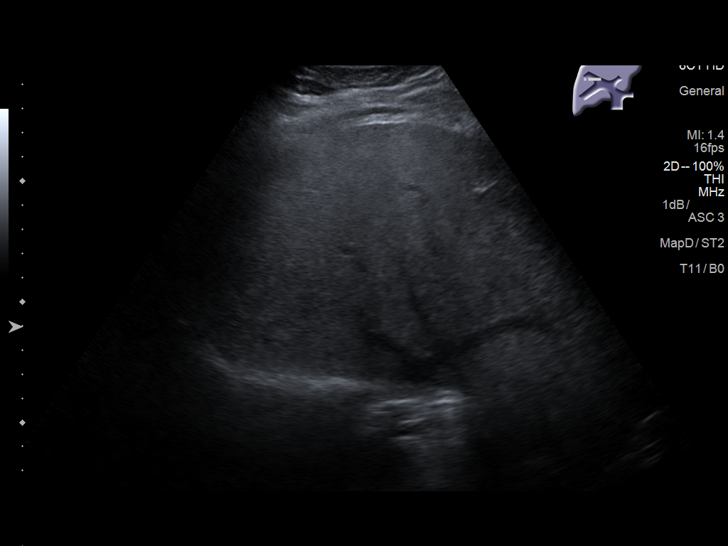
[im 65/120]
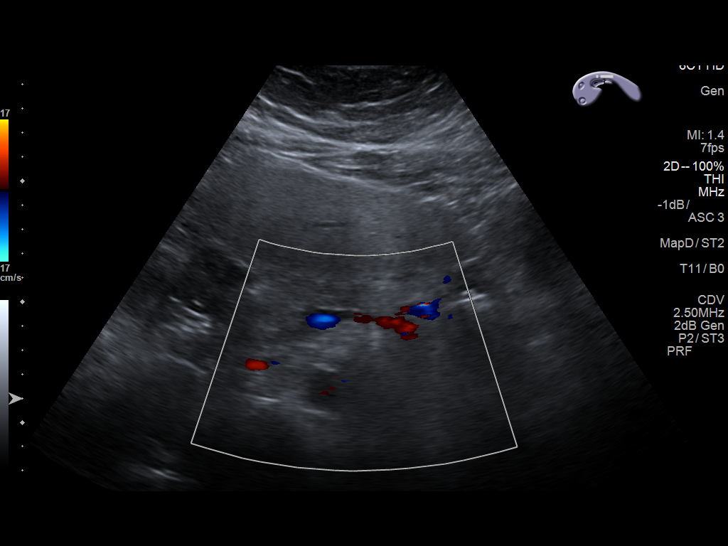
[im 75/120]
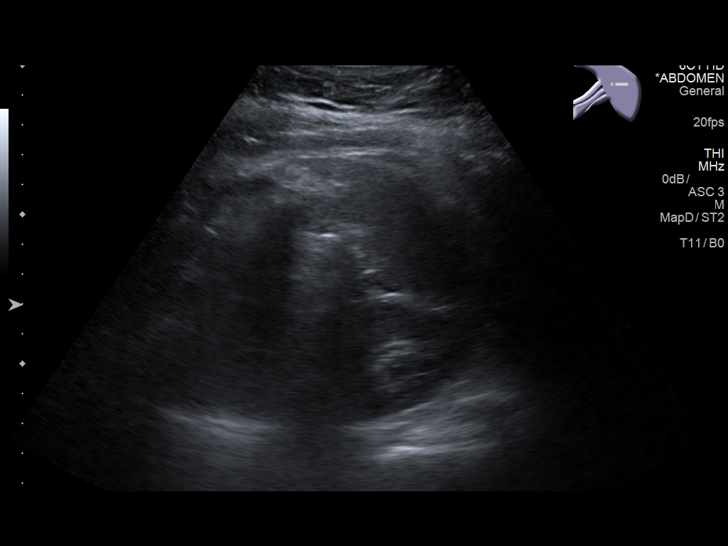
[im 80/120]
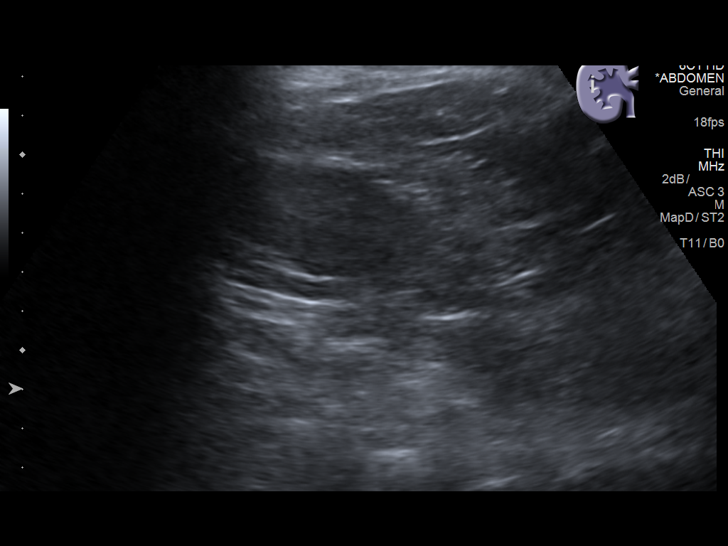
[im 90/120]
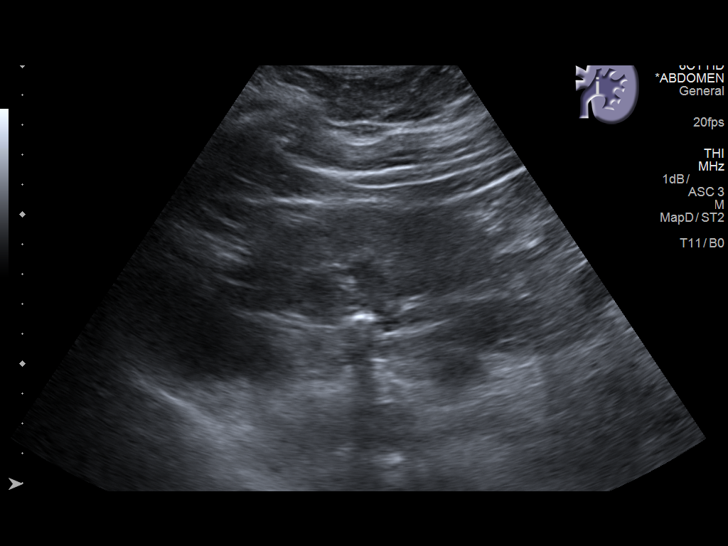
[im 100/120]
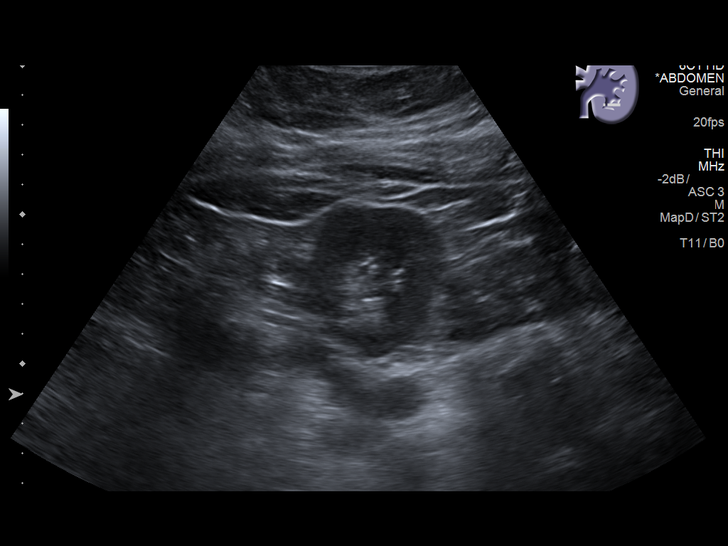
[im 110/120]
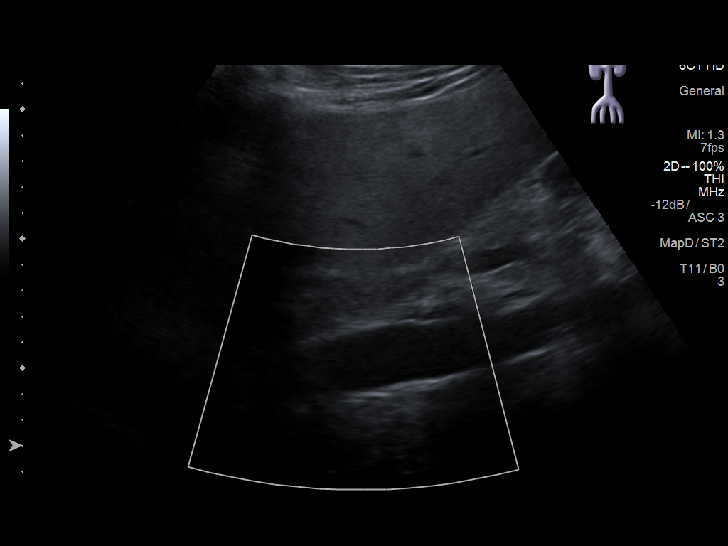
[im 120/120]
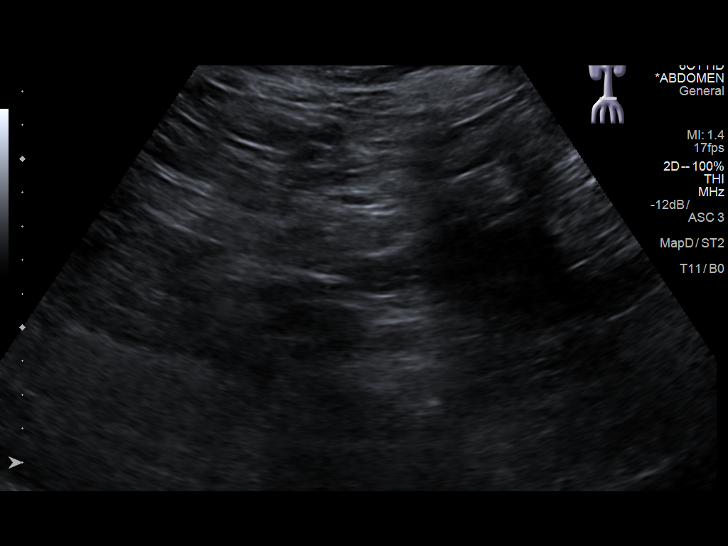

[14 of 25 positions shown; findings below may reference images not displayed]

FINDINGS: Gallbladder: No gallstones or wall thickening visualized. No
sonographic Murphy sign noted by sonographer.

Common bile duct: Diameter: 3.3 mm.

Liver: No focal lesion identified. Within normal limits in
parenchymal echogenicity. Portal vein is patent on color Doppler
imaging with normal direction of blood flow towards the liver.

IVC: No abnormality visualized.

Pancreas: Visualized portion unremarkable.

Spleen: Size and appearance within normal limits.

Right Kidney: Length: 10.8 cm.. Echogenicity within normal limits.
No mass or hydronephrosis visualized.

Left Kidney: Length: 14.0 cm.. Echogenic focus is noted with mild
posterior acoustical shadowing measuring 1 cm. This is consistent
with a nonobstructing stone.

Abdominal aorta: Mild atherosclerotic changes are noted without
aneurysmal dilatation.

Other findings: None.
IMPRESSION: Nonobstructing left renal stone.

No other focal abnormality is seen.

## 2017-09-01 ENCOUNTER — Encounter (HOSPITAL_BASED_OUTPATIENT_CLINIC_OR_DEPARTMENT_OTHER)
Admission: RE | Admit: 2017-09-01 | Discharge: 2017-09-01 | Disposition: A | Discharge status: Home / Self Care | Payer: BLUE CROSS/BLUE SHIELD | Source: Ambulatory Visit | Attending: Obstetrics and Gynecology | Admitting: Obstetrics and Gynecology

## 2017-09-01 ENCOUNTER — Encounter (HOSPITAL_BASED_OUTPATIENT_CLINIC_OR_DEPARTMENT_OTHER): Payer: Self-pay | Admitting: *Deleted

## 2017-09-01 ENCOUNTER — Other Ambulatory Visit: Payer: Self-pay

## 2017-09-01 DIAGNOSIS — E119 Type 2 diabetes mellitus without complications: Secondary | ICD-10-CM | POA: Diagnosis not present

## 2017-09-01 DIAGNOSIS — E785 Hyperlipidemia, unspecified: Secondary | ICD-10-CM | POA: Diagnosis not present

## 2017-09-01 DIAGNOSIS — K219 Gastro-esophageal reflux disease without esophagitis: Secondary | ICD-10-CM | POA: Diagnosis not present

## 2017-09-01 DIAGNOSIS — E669 Obesity, unspecified: Secondary | ICD-10-CM | POA: Diagnosis not present

## 2017-09-01 DIAGNOSIS — N950 Postmenopausal bleeding: Secondary | ICD-10-CM | POA: Diagnosis not present

## 2017-09-01 DIAGNOSIS — Z885 Allergy status to narcotic agent status: Secondary | ICD-10-CM | POA: Diagnosis not present

## 2017-09-01 DIAGNOSIS — Z6841 Body Mass Index (BMI) 40.0 and over, adult: Secondary | ICD-10-CM | POA: Diagnosis not present

## 2017-09-01 DIAGNOSIS — I10 Essential (primary) hypertension: Secondary | ICD-10-CM | POA: Diagnosis not present

## 2017-09-01 DIAGNOSIS — Z79899 Other long term (current) drug therapy: Secondary | ICD-10-CM | POA: Diagnosis not present

## 2017-09-01 DIAGNOSIS — F329 Major depressive disorder, single episode, unspecified: Secondary | ICD-10-CM | POA: Diagnosis not present

## 2017-09-01 DIAGNOSIS — Z794 Long term (current) use of insulin: Secondary | ICD-10-CM | POA: Diagnosis not present

## 2017-09-01 DIAGNOSIS — R9389 Abnormal findings on diagnostic imaging of other specified body structures: Secondary | ICD-10-CM | POA: Diagnosis not present

## 2017-09-01 LAB — BASIC METABOLIC PANEL
ANION GAP: 11 (ref 5–15)
BUN: 18 mg/dL (ref 6–20)
CALCIUM: 9.3 mg/dL (ref 8.9–10.3)
CO2: 25 mmol/L (ref 22–32)
Chloride: 103 mmol/L (ref 101–111)
Creatinine, Ser: 0.92 mg/dL (ref 0.44–1.00)
GFR calc Af Amer: >60 mL/min (ref >60)
GFR calc non Af Amer: >60 mL/min (ref >60)
GLUCOSE: 166 mg/dL — AB (ref 65–99)
POTASSIUM: 3.5 mmol/L (ref 3.5–5.1)
Sodium: 139 mmol/L (ref 135–145)

## 2017-09-01 LAB — CBC
HCT: 37.5 % (ref 36.0–46.0)
Hemoglobin: 12.6 g/dL (ref 12.0–15.0)
MCH: 30.1 pg (ref 26.0–34.0)
MCHC: 33.6 g/dL (ref 30.0–36.0)
MCV: 89.5 fL (ref 78.0–100.0)
PLATELETS: 224 10*3/uL (ref 150–400)
RBC: 4.19 MIL/uL (ref 3.87–5.11)
RDW: 12.8 % (ref 11.5–15.5)
WBC: 7.1 10*3/uL (ref 4.0–10.5)

## 2017-09-07 ENCOUNTER — Ambulatory Visit (HOSPITAL_BASED_OUTPATIENT_CLINIC_OR_DEPARTMENT_OTHER)
Admission: RE | Admit: 2017-09-07 | Discharge: 2017-09-07 | Disposition: A | Discharge status: Home / Self Care | Payer: BLUE CROSS/BLUE SHIELD | Source: Ambulatory Visit | Attending: Obstetrics and Gynecology | Admitting: Obstetrics and Gynecology

## 2017-09-07 ENCOUNTER — Encounter (HOSPITAL_BASED_OUTPATIENT_CLINIC_OR_DEPARTMENT_OTHER)
Admission: RE | Disposition: A | Discharge status: Home / Self Care | Payer: Self-pay | Source: Ambulatory Visit | Attending: Obstetrics and Gynecology

## 2017-09-07 ENCOUNTER — Ambulatory Visit (HOSPITAL_BASED_OUTPATIENT_CLINIC_OR_DEPARTMENT_OTHER): Payer: BLUE CROSS/BLUE SHIELD | Admitting: Certified Registered"

## 2017-09-07 ENCOUNTER — Encounter (HOSPITAL_BASED_OUTPATIENT_CLINIC_OR_DEPARTMENT_OTHER): Payer: Self-pay | Admitting: *Deleted

## 2017-09-07 ENCOUNTER — Other Ambulatory Visit: Payer: Self-pay

## 2017-09-07 DIAGNOSIS — Z885 Allergy status to narcotic agent status: Secondary | ICD-10-CM | POA: Diagnosis not present

## 2017-09-07 DIAGNOSIS — I10 Essential (primary) hypertension: Secondary | ICD-10-CM | POA: Diagnosis not present

## 2017-09-07 DIAGNOSIS — N939 Abnormal uterine and vaginal bleeding, unspecified: Secondary | ICD-10-CM | POA: Diagnosis present

## 2017-09-07 DIAGNOSIS — E119 Type 2 diabetes mellitus without complications: Secondary | ICD-10-CM | POA: Diagnosis not present

## 2017-09-07 DIAGNOSIS — E785 Hyperlipidemia, unspecified: Secondary | ICD-10-CM | POA: Insufficient documentation

## 2017-09-07 DIAGNOSIS — N950 Postmenopausal bleeding: Secondary | ICD-10-CM | POA: Diagnosis not present

## 2017-09-07 DIAGNOSIS — Z79899 Other long term (current) drug therapy: Secondary | ICD-10-CM | POA: Diagnosis not present

## 2017-09-07 DIAGNOSIS — F329 Major depressive disorder, single episode, unspecified: Secondary | ICD-10-CM | POA: Insufficient documentation

## 2017-09-07 DIAGNOSIS — Z6841 Body Mass Index (BMI) 40.0 and over, adult: Secondary | ICD-10-CM | POA: Diagnosis not present

## 2017-09-07 DIAGNOSIS — K219 Gastro-esophageal reflux disease without esophagitis: Secondary | ICD-10-CM | POA: Diagnosis not present

## 2017-09-07 DIAGNOSIS — Z794 Long term (current) use of insulin: Secondary | ICD-10-CM | POA: Diagnosis not present

## 2017-09-07 DIAGNOSIS — R0600 Dyspnea, unspecified: Secondary | ICD-10-CM | POA: Diagnosis not present

## 2017-09-07 DIAGNOSIS — R9389 Abnormal findings on diagnostic imaging of other specified body structures: Secondary | ICD-10-CM | POA: Insufficient documentation

## 2017-09-07 DIAGNOSIS — E669 Obesity, unspecified: Secondary | ICD-10-CM | POA: Insufficient documentation

## 2017-09-07 HISTORY — DX: Depression, unspecified: F32.A

## 2017-09-07 HISTORY — PX: HYSTEROSCOPY WITH D & C: SHX1775

## 2017-09-07 HISTORY — DX: Major depressive disorder, single episode, unspecified: F32.9

## 2017-09-07 LAB — GLUCOSE, CAPILLARY
Glucose-Capillary: 150 mg/dL — ABNORMAL HIGH (ref 65–99)
Glucose-Capillary: 128 mg/dL — ABNORMAL HIGH (ref 65–99)

## 2017-09-07 SURGERY — DILATATION AND CURETTAGE /HYSTEROSCOPY
Anesthesia: General | Site: Uterus

## 2017-09-07 MED ORDER — FENTANYL CITRATE (PF) 100 MCG/2ML IJ SOLN
INTRAMUSCULAR | Status: AC
  Filled 2017-09-07: qty 2

## 2017-09-07 MED ORDER — LACTATED RINGERS IV SOLN
INTRAVENOUS | Status: DC
  Administered 2017-09-07 (×2): via INTRAVENOUS

## 2017-09-07 MED ORDER — FENTANYL CITRATE (PF) 100 MCG/2ML IJ SOLN
50.0000 ug | INTRAMUSCULAR | Status: AC | PRN
  Administered 2017-09-07 (×2): 25 ug via INTRAVENOUS
  Administered 2017-09-07: 50 ug via INTRAVENOUS

## 2017-09-07 MED ORDER — PROPOFOL 10 MG/ML IV BOLUS
INTRAVENOUS | Status: DC | PRN
  Administered 2017-09-07: 200 mg via INTRAVENOUS

## 2017-09-07 MED ORDER — EPHEDRINE SULFATE 50 MG/ML IJ SOLN
INTRAMUSCULAR | Status: DC | PRN
  Administered 2017-09-07 (×2): 10 mg via INTRAVENOUS
  Administered 2017-09-07: 5 mg via INTRAVENOUS

## 2017-09-07 MED ORDER — FENTANYL CITRATE (PF) 100 MCG/2ML IJ SOLN: 25.0000 ug | INTRAMUSCULAR | Status: DC | PRN

## 2017-09-07 MED ORDER — ONDANSETRON HCL 4 MG/2ML IJ SOLN
INTRAMUSCULAR | Status: AC
  Filled 2017-09-07: qty 2

## 2017-09-07 MED ORDER — OXYCODONE HCL 5 MG/5ML PO SOLN: 5.0000 mg | Freq: Once | ORAL | Status: DC | PRN

## 2017-09-07 MED ORDER — ONDANSETRON HCL 4 MG/2ML IJ SOLN: 4.0000 mg | Freq: Four times a day (QID) | INTRAMUSCULAR | Status: DC | PRN

## 2017-09-07 MED ORDER — LIDOCAINE HCL (CARDIAC) 20 MG/ML IV SOLN
INTRAVENOUS | Status: DC | PRN
  Administered 2017-09-07: 100 mg via INTRAVENOUS

## 2017-09-07 MED ORDER — KETOROLAC TROMETHAMINE 30 MG/ML IJ SOLN
INTRAMUSCULAR | Status: DC | PRN
  Administered 2017-09-07: 30 mg via INTRAVENOUS

## 2017-09-07 MED ORDER — MIDAZOLAM HCL 2 MG/2ML IJ SOLN
INTRAMUSCULAR | Status: AC
  Filled 2017-09-07: qty 2

## 2017-09-07 MED ORDER — PROPOFOL 10 MG/ML IV BOLUS
INTRAVENOUS | Status: AC
  Filled 2017-09-07: qty 20

## 2017-09-07 MED ORDER — ONDANSETRON HCL 4 MG/2ML IJ SOLN
INTRAMUSCULAR | Status: DC | PRN
  Administered 2017-09-07: 4 mg via INTRAVENOUS

## 2017-09-07 MED ORDER — SCOPOLAMINE 1 MG/3DAYS TD PT72: 1.0000 | MEDICATED_PATCH | Freq: Once | TRANSDERMAL | Status: DC | PRN

## 2017-09-07 MED ORDER — SODIUM CHLORIDE 0.9 % IV SOLN: INTRAVENOUS | Status: DC

## 2017-09-07 MED ORDER — MIDAZOLAM HCL 2 MG/2ML IJ SOLN
1.0000 mg | INTRAMUSCULAR | Status: DC | PRN
  Administered 2017-09-07: 1 mg via INTRAVENOUS

## 2017-09-07 MED ORDER — OXYCODONE HCL 5 MG PO TABS: 5.0000 mg | ORAL_TABLET | Freq: Once | ORAL | Status: DC | PRN

## 2017-09-07 MED ORDER — DEXAMETHASONE SODIUM PHOSPHATE 10 MG/ML IJ SOLN
INTRAMUSCULAR | Status: AC
  Filled 2017-09-07: qty 1

## 2017-09-07 SURGICAL SUPPLY — 21 items
BRIEF STRETCH FOR OB PAD XXL (UNDERPADS AND DIAPERS) ×2 IMPLANT
CANISTER SUCT 3000ML PPV (MISCELLANEOUS) IMPLANT
CATH ROBINSON RED A/P 16FR (CATHETERS) ×2 IMPLANT
DEVICE MYOSURE LITE (MISCELLANEOUS) IMPLANT
DEVICE MYOSURE REACH (MISCELLANEOUS) IMPLANT
FILTER ARTHROSCOPY CONVERTOR (FILTER) ×2 IMPLANT
GLOVE BIOGEL PI IND STRL 6.5 (GLOVE) ×1 IMPLANT
GLOVE BIOGEL PI IND STRL 7.0 (GLOVE) ×1 IMPLANT
GLOVE BIOGEL PI INDICATOR 6.5 (GLOVE) ×1
GLOVE BIOGEL PI INDICATOR 7.0 (GLOVE) ×1
GLOVE ORTHOPEDIC STR SZ6.5 (GLOVE) ×2 IMPLANT
GLOVE SURG SS PI 7.0 STRL IVOR (GLOVE) ×1 IMPLANT
GOWN STRL REUS W/TWL LRG LVL3 (GOWN DISPOSABLE) ×4 IMPLANT
IV NS IRRIG 3000ML ARTHROMATIC (IV SOLUTION) ×2 IMPLANT
PACK VAGINAL MINOR WOMEN LF (CUSTOM PROCEDURE TRAY) ×2 IMPLANT
PAD OB MATERNITY 4.3X12.25 (PERSONAL CARE ITEMS) ×2 IMPLANT
SEAL ROD LENS SCOPE MYOSURE (ABLATOR) ×1 IMPLANT
SLEEVE SCD COMPRESS KNEE MED (MISCELLANEOUS) ×2 IMPLANT
TOWEL OR 17X24 6PK STRL BLUE (TOWEL DISPOSABLE) ×4 IMPLANT
TUBING AQUILEX INFLOW (TUBING) ×2 IMPLANT
TUBING AQUILEX OUTFLOW (TUBING) ×2 IMPLANT

## 2017-09-07 NOTE — Progress Notes (Signed)
   09/07/17 1226  OBSTRUCTIVE SLEEP APNEA  Have you ever been diagnosed with sleep apnea through a sleep study? No  Do you snore loudly (loud enough to be heard through closed doors)?  1  Do you often feel tired, fatigued, or sleepy during the daytime (such as falling asleep during driving or talking to someone)? 1  Has anyone observed you stop breathing during your sleep? 0  Do you have, or are you being treated for high blood pressure? 1  BMI more than 35 kg/m2? 1  Age > 50 (1-yes) 1  Neck circumference greater than:Female 16 inches or larger, Female 17inches or larger? 0  Female Gender (Yes=1) 0  Obstructive Sleep Apnea Score 5

## 2017-09-07 NOTE — Op Note (Signed)
Shannon Oliver PROCEDURE DATE: 09/07/2017   PREOPERATIVE DIAGNOSIS:  Post menopausal uterine bleeding  POSTOPERATIVE DIAGNOSIS: same  PROCEDURE: Hysteroscopic dilation and curettage  SURGEON:  Dr. Feliz Beam  ANESTHESIOLOGY TEAM: Anesthesiologist: Albertha Ghee, MD CRNA: Verita Lamb, CRNA  FINDINGS: INDICATIONS: 57 y.o.  [phone] presenting with post menopausal bleeding recommended for [employer] after inadequate endometrial biopsy.  Risks of surgery were discussed with the patient including but not limited to: bleeding which may require transfusion; infection which may require antibiotics; injury to uterus or surrounding organs; need for additional procedures including laparotomy or laparoscopy; and other postoperative/anesthesia complications. Written informed consent was obtained.  FINDINGS:   Normal appearing minimally fluffy endometrial tissue, no significant lesions/polyps noted, left ostia not visible, right ostia normal appearing   ANESTHESIA:   General INTRAVENOUS FLUIDS:  1000 ml of LR FLUID DEFICITS:  100 ml of normal saline ESTIMATED BLOOD LOSS:  Less than 20 ml SPECIMENS: endometrial curettings sent to pathology COMPLICATIONS:  None immediate.  PROCEDURE DETAILS:  The patient was seen in the pre-op area where the plan was reviewed and she again verbalized understanding and consent.  She was then taken to the operating room where general anesthesia was administered.  After an adequate timeout was performed, she was placed in the dorsal lithotomy position and examined; then prepped and draped in the sterile manner. A straight catheter was inserted into the bladder sterilely and the bladder was drained.  A weighted speculum was then placed in the patient's vagina and a Sims retractor used to visualize the cervix. A single toothed tenaculum was applied to the anterior lip of the cervix.  The cervix then dilated manually with metal dilators to accommodate the 6 mm operative  hysteroscope. The half-Pratt dilators were required to begin dilation as the tract was slight stenotic. Once the cervix was dilated, the hysteroscope was inserted under direct visualization using normal saline as a suspension medium.  The uterine cavity was carefully examined with the findings as noted above. Some fluffy endometrial tissue noted with no significant lesions noted. Cervix visualized while removing hysteroscope, no perforation was visualized and no bleeding noted. A sharp curettage was then performed to obtain a small amount of endometrial curettings. One final curettage was performed with a serrated curette and the products sent to pathology.  The tenaculum was removed from the anterior lip of the cervix with good hemostasis noted at the tenaculum site. All instruments were remvoed from the vagina.  The patient tolerated the procedure well and was taken to the recovery area awake, extubated and in stable condition.  The patient will be discharged to home as per PACU criteria.  Routine postoperative instructions given.  She was prescribed Percocet, Ibuprofen and Colace.  She will follow up in the clinic in 2 weeks  for postoperative evaluation.    Feliz Beam, M.D. Attending [place], [facility] for [employer], [place]

## 2017-09-07 NOTE — Anesthesia Procedure Notes (Signed)
Procedure Name: LMA Insertion Performed by: Reshunda Strider M, CRNA Pre-anesthesia Checklist: Patient identified, Emergency Drugs available, Suction available, Patient being monitored and Timeout performed Patient Re-evaluated:Patient Re-evaluated prior to induction Oxygen Delivery Method: Circle system utilized Preoxygenation: Pre-oxygenation with 100% oxygen Induction Type: IV induction LMA: LMA inserted LMA Size: 4.0 Tube type: Oral Number of attempts: 1 Placement Confirmation: CO2 detector,  positive ETCO2 and breath sounds checked- equal and bilateral Tube secured with: Tape Dental Injury: Teeth and Oropharynx as per pre-operative assessment        

## 2017-09-07 NOTE — H&P (Signed)
OB/GYN History and Physical  Shannon Oliver is a 57 y.o. [id number] presenting for D&C hysteroscopy for post menopausal bleeding with inadequate sampling on endometrial biopsy.   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 - SEE MICROSCOPIC DESCRIPTION.      Past Medical History:  Diagnosis Date  . Abnormal EKG    LVH with strain  . Acid reflux   . Depression   . Diabetes mellitus    A1C over 9  . HLD (hyperlipidemia)   . Hypertension   . Noncompliance   . Obesity     Past Surgical History:  Procedure Laterality Date  . COLONOSCOPY WITH PROPOFOL N/A 04/29/2015   Procedure: COLONOSCOPY WITH PROPOFOL;  Surgeon: Garlan Fair, MD;  Location: [facility];  Service: Endoscopy;  Laterality: N/A;  . KNEE ARTHROSCOPY W/ MENISCAL REPAIR Right     OB History  Gravida Para Term Preterm AB Living  3 3 3     3   SAB TAB Ectopic Multiple Live Births          3    # Outcome Date GA Lbr Len/2nd Weight Sex Delivery Anes PTL Lv  3 Term      Vag-Spont   LIV  2 Term      Vag-Spont   LIV  1 Term      Vag-Spont   LIV      Social History   Socioeconomic History  . Marital status: Married    Spouse name: None  . Number of children: None  . Years of education: None  . Highest education level: None  Social Needs  . Financial resource strain: None  . Food insecurity - worry: None  . Food insecurity - inability: None  . Transportation needs - medical: None  . Transportation needs - non-medical: None  Occupational History  . None  Tobacco Use  . Smoking status: Never Smoker  . Smokeless tobacco: Never Used  Substance and Sexual Activity  . Alcohol use: No  . Drug use: No  . Sexual activity: None  Other Topics Concern  . None  Social History Narrative  . None    Family History  Problem Relation Age of Onset  . Diabetes Neg Hx     Medications Prior to Admission  Medication Sig Dispense Refill Last Dose  .  acetaminophen (TYLENOL) 500 MG tablet Take 500-1,000 mg by mouth every 6 (six) hours as needed for mild pain.   09/06/2017 at Unknown time  . amLODipine (NORVASC) 5 MG tablet Take 2.5 mg by mouth at bedtime.    09/06/2017 at Unknown time  . glimepiride (AMARYL) 2 MG tablet Take 2 mg by mouth daily before breakfast.   09/06/2017 at Unknown time  . insulin detemir (LEVEMIR) 100 UNIT/ML injection Inject 15 Units into the skin every morning.   09/06/2017 at Unknown time  . losartan-hydrochlorothiazide (HYZAAR) 100-25 MG per tablet Take 1 tablet by mouth every morning.    09/06/2017 at Unknown time  . MELATONIN PO Take by mouth.   Past Month at Unknown time  . metFORMIN (GLUCOPHAGE) 500 MG tablet Take 500 mg by mouth 2 (two) times daily with a meal.   09/06/2017 at Unknown time  . metoprolol tartrate (LOPRESSOR) 25 MG tablet Take 25 mg by mouth 2 (two) times daily.   09/07/2017 at 0700  . Multiple Vitamins-Calcium (ONE-A-DAY WOMENS PO) Take 1 tablet by mouth daily.   09/06/2017 at Unknown time  . simvastatin (ZOCOR)  10 MG tablet Take 10 mg by mouth daily.   09/06/2017 at Unknown time    Allergies  Allergen Reactions  . Hydrocodone Hypertension    Review of Systems: Negative except for what is mentioned in HPI.     Physical Exam: BP (!) 145/78   Pulse 60   Temp 97.8 F (36.6 C) (Oral)   Resp 18   Ht 5\' 6"  (1.676 m)   Wt 253 lb (114.8 kg)   LMP 08/02/2013 Comment: spotting  SpO2 98%   BMI 40.84 kg/m  CONSTITUTIONAL: Well-developed, well-nourished female in no acute distress.  HENT:  Normocephalic, atraumatic, External right and left ear normal. Oropharynx is clear and moist EYES: Conjunctivae and EOM are normal. Pupils are equal, round, and reactive to light. No scleral icterus.  NECK: Normal range of motion, supple, no masses SKIN: Skin is warm and dry. No rash noted. Not diaphoretic. No erythema. No pallor. [place]: Alert and oriented to person, place, and time. Normal reflexes, muscle tone  coordination. No cranial nerve deficit noted. PSYCHIATRIC: Normal mood and affect. Normal behavior. Normal judgment and thought content. CARDIOVASCULAR: Normal heart rate noted, regular rhythm RESPIRATORY: Effort and breath sounds normal, no problems with respiration noted ABDOMEN: Soft, nontender, nondistended, gravid.  PELVIC: Deferred MUSCULOSKELETAL: Normal range of motion. No edema and no tenderness. 2+ distal pulses.   Pertinent Labs/Studies:   No results found for this or any previous visit (from the past 72 hour(s)).     Assessment and Plan :Shannon Oliver is a 57 y.o. [phone] presenting for D&C hysteroscopy for post menopausal bleeding with inadequate sampling with atypia noted on endometrial biopsy. The risks of hysteroscopic D&C were reviewed with the patient; including but not limited to: infection which may require antibiotics; bleeding which may require transfusion or re-operation; injury to bowel, bladder, ureters or other surrounding organs; need for additional procedures including hysterectomy in the event of a life-threatening hemorrhage; thromboembolic phenomenon and other postoperative/anesthesia complications. Reviewed that all sampling will be sent to pathology and further management based on findings. The patient concurred with the proposed plan, giving informed consent for the procedure. She is agreeable to a blood transfusion in the event of emergency.   Plan for hysteroscopy D&C NPO Admission labs ordered VS Q4 SCDs   K. Arvilla Meres, M.D. Attending [place], [facility] for [employer], [place]

## 2017-09-07 NOTE — Anesthesia Postprocedure Evaluation (Signed)
Anesthesia Post Note  Patient: Shannon Oliver  Procedure(s) Performed: DILATATION AND CURETTAGE /HYSTEROSCOPY (N/A Uterus)     Patient location during evaluation: PACU Anesthesia Type: General Level of consciousness: awake and alert Pain management: pain level controlled Vital Signs Assessment: post-procedure vital signs reviewed and stable Respiratory status: spontaneous breathing, nonlabored ventilation, respiratory function stable and patient connected to nasal cannula oxygen Cardiovascular status: blood pressure returned to baseline and stable Postop Assessment: no apparent nausea or vomiting Anesthetic complications: no    Last Vitals:  Vitals:   09/07/17 1430 09/07/17 1500  BP: (!) 160/89 (!) 149/75  Pulse: (!) 53 62  Resp: 10 16  Temp:  36.4 C  SpO2: 100% 100%    Last Pain:  Vitals:   09/07/17 1500  TempSrc:   PainSc: 0-No pain                 Georgeana Oertel S

## 2017-09-07 NOTE — Transfer of Care (Signed)
Immediate Anesthesia Transfer of Care Note  Patient: Shannon Oliver  Procedure(s) Performed: DILATATION AND CURETTAGE /HYSTEROSCOPY (N/A Uterus)  Patient Location: PACU  Anesthesia Type:General  Level of Consciousness: awake, alert  and oriented  Airway & Oxygen Therapy: Patient Spontanous Breathing and Patient connected to face mask oxygen  Post-op Assessment: Report given to RN and Post -op Vital signs reviewed and stable  Post vital signs: Reviewed and stable  Last Vitals:  Vitals:   09/07/17 1204  BP: (!) 145/78  Pulse: 60  Resp: 18  Temp: 36.6 C  SpO2: 98%    Last Pain:  Vitals:   09/07/17 1204  TempSrc: Oral  PainSc: 3       Patients Stated Pain Goal: 3 (50/03/70 4888)  Complications: No apparent anesthesia complications

## 2017-09-07 NOTE — Discharge Instructions (Signed)
No Ibuprofen until 7:41pm if needed.  Dilation and Curettage, Care After These instructions give you information about caring for yourself after your procedure. Your doctor may also give you more specific instructions. Call your doctor if you have any problems or questions after your procedure. Follow these instructions at home: Activity  Do not drive or use heavy machinery while taking prescription pain medicine.  For 24 hours after your procedure, avoid driving.  Take short walks often, followed by rest periods. Ask your doctor what activities are safe for you. After one or two days, you may be able to return to your normal activities.  Do not lift anything that is heavier than 10 lb (4.5 kg) until your doctor approves.  For at least 2 weeks, or as long as told by your doctor: ? Do not douche. ? Do not use tampons. ? Do not have sex. General instructions  Take over-the-counter and prescription medicines only as told by your doctor. This is very important if you take blood thinning medicine.  Do not take baths, swim, or use a hot tub until your doctor approves. Take showers instead of baths.  Wear compression stockings as told by your doctor.  It is up to you to get the results of your procedure. Ask your doctor when your results will be ready.  Keep all follow-up visits as told by your doctor. This is important. Contact a doctor if:  You have very bad cramps that get worse or do not get better with medicine.  You have very bad pain in your belly (abdomen).  You cannot drink fluids without throwing up (vomiting).  You get pain in a different part of the area between your belly and thighs (pelvis).  You have bad-smelling discharge from your vagina.  You have a rash. Get help right away if:  You are bleeding a lot from your vagina. A lot of bleeding means soaking more than one sanitary pad in an hour, for 2 hours in a row.  You have clumps of blood (blood clots) coming  from your vagina.  You have a fever or chills.  Your belly feels very tender or hard.  You have chest pain.  You have trouble breathing.  You cough up blood.  You feel dizzy.  You feel light-headed.  You pass out (faint).  You have pain in your neck or shoulder area. Summary  Take short walks often, followed by rest periods. Ask your doctor what activities are safe for you. After one or two days, you may be able to return to your normal activities.  Do not lift anything that is heavier than 10 lb (4.5 kg) until your doctor approves.  Do not take baths, swim, or use a hot tub until your doctor approves. Take showers instead of baths.  Contact your doctor if you have any symptoms of infection, like bad-smelling discharge from your vagina. This information is not intended to replace advice given to you by your health care provider. Make sure you discuss any questions you have with your health care provider. Document Released: 03/30/2008 Document Revised: 03/08/2016 Document Reviewed: 03/08/2016 Elsevier Interactive Patient Education  2017 [facility] Instructions  Activity: Get plenty of rest for the remainder of the day. A responsible individual must stay with you for 24 hours following the procedure.  For the next 24 hours, DO NOT: -Drive a car -Paediatric nurse -Drink alcoholic beverages -Take any medication unless instructed by your physician -Make  any legal decisions or sign important papers.  Meals: Start with liquid foods such as gelatin or soup. Progress to regular foods as tolerated. Avoid greasy, spicy, heavy foods. If nausea and/or vomiting occur, drink only clear liquids until the nausea and/or vomiting subsides. Call your physician if vomiting continues.  Special Instructions/Symptoms: Your throat may feel dry or sore from the anesthesia or the breathing tube placed in your throat during surgery. If this causes discomfort,  gargle with warm salt water. The discomfort should disappear within 24 hours.  If you had a scopolamine patch placed behind your ear for the management of post- operative nausea and/or vomiting:  1. The medication in the patch is effective for 72 hours, after which it should be removed.  Wrap patch in a tissue and discard in the trash. Wash hands thoroughly with soap and water. 2. You may remove the patch earlier than 72 hours if you experience unpleasant side effects which may include dry mouth, dizziness or visual disturbances. 3. Avoid touching the patch. Wash your hands with soap and water after contact with the patch.

## 2017-09-07 NOTE — Anesthesia Preprocedure Evaluation (Addendum)
Anesthesia Evaluation  Patient identified by MRN, date of birth, ID band Patient awake    Reviewed: Allergy & Precautions, H&P , NPO status , Patient's Chart, lab work & pertinent test results  Airway Mallampati: II   Neck ROM: full    Dental   Pulmonary neg pulmonary ROS,    breath sounds clear to auscultation       Cardiovascular hypertension, Pt. on medications  Rhythm:regular Rate:Normal     Neuro/Psych PSYCHIATRIC DISORDERS Depression negative neurological ROS     GI/Hepatic Neg liver ROS, GERD  ,  Endo/Other  diabetes, Type 2, Oral Hypoglycemic Agents  Renal/GU      Musculoskeletal   Abdominal   Peds  Hematology   Anesthesia Other Findings - HLD  Reproductive/Obstetrics                            Lab Results  Component Value Date   WBC 7.1 09/01/2017   HGB 12.6 09/01/2017   HCT 37.5 09/01/2017   MCV 89.5 09/01/2017   PLT 224 09/01/2017   Lab Results  Component Value Date   CREATININE 0.92 09/01/2017   BUN 18 09/01/2017   NA 139 09/01/2017   K 3.5 09/01/2017   CL 103 09/01/2017   CO2 25 09/01/2017   No results found for: INR, PROTIME  EKG: normal sinus rhythm.  Anesthesia Physical Anesthesia Plan  ASA: III  Anesthesia Plan: General   Post-op Pain Management:    Induction: Intravenous  PONV Risk Score and Plan: 4 or greater and Ondansetron, Dexamethasone, Midazolam and Scopolamine patch - Pre-op  Airway Management Planned: LMA  Additional Equipment: None  Intra-op Plan:   Post-operative Plan: Extubation in OR  Informed Consent: I have reviewed the patients History and Physical, chart, labs and discussed the procedure including the risks, benefits and alternatives for the proposed anesthesia with the patient or authorized representative who has indicated his/her understanding and acceptance.   Dental advisory given  Plan Discussed with: CRNA  Anesthesia  Plan Comments:         Anesthesia Quick Evaluation

## 2017-09-08 ENCOUNTER — Encounter (HOSPITAL_BASED_OUTPATIENT_CLINIC_OR_DEPARTMENT_OTHER): Payer: Self-pay | Admitting: Obstetrics and Gynecology

## 2017-09-14 DIAGNOSIS — F419 Anxiety disorder, unspecified: Secondary | ICD-10-CM | POA: Diagnosis not present

## 2017-09-14 DIAGNOSIS — E1165 Type 2 diabetes mellitus with hyperglycemia: Secondary | ICD-10-CM | POA: Diagnosis not present

## 2017-09-14 DIAGNOSIS — Z0000 Encounter for general adult medical examination without abnormal findings: Secondary | ICD-10-CM | POA: Diagnosis not present

## 2017-09-14 DIAGNOSIS — E785 Hyperlipidemia, unspecified: Secondary | ICD-10-CM | POA: Diagnosis not present

## 2017-09-15 ENCOUNTER — Ambulatory Visit: Payer: BLUE CROSS/BLUE SHIELD | Admitting: Endocrinology

## 2017-10-17 ENCOUNTER — Ambulatory Visit: Payer: BLUE CROSS/BLUE SHIELD | Admitting: Endocrinology

## 2017-10-17 ENCOUNTER — Encounter: Payer: Self-pay | Admitting: Endocrinology

## 2017-10-17 VITALS — BP 142/86 | HR 77 | Ht 66.0 in | Wt 252.2 lb

## 2017-10-17 DIAGNOSIS — E1165 Type 2 diabetes mellitus with hyperglycemia: Secondary | ICD-10-CM | POA: Diagnosis not present

## 2017-10-17 DIAGNOSIS — I10 Essential (primary) hypertension: Secondary | ICD-10-CM

## 2017-10-17 DIAGNOSIS — Z794 Long term (current) use of insulin: Secondary | ICD-10-CM

## 2017-10-17 MED ORDER — INSULIN NPH ISOPHANE & REGULAR (70-30) 100 UNIT/ML ~~LOC~~ SUSP: 12.0000 [IU] | Freq: Two times a day (BID) | SUBCUTANEOUS | 1 refills | Status: DC

## 2017-10-17 MED ORDER — "INSULIN SYRINGE-NEEDLE U-100 30G X 1/2"" 0.3 ML MISC": 0 refills | Status: DC

## 2017-10-17 MED ORDER — CANAGLIFLOZIN 300 MG PO TABS: 300.0000 mg | ORAL_TABLET | Freq: Every day | ORAL | 3 refills | Status: DC

## 2017-10-17 NOTE — Progress Notes (Signed)
Patient ID: Shannon Oliver, female   DOB: 14-May-1961, 56 y.o.   MRN: [id number]          Reason for Appointment: Consultation for Type 2 Diabetes  Referring physician: Dr. Fara Olden   History of Present Illness:          Date of diagnosis of type 2 diabetes mellitus: 2008       Background history:   She thinks she has been on mostly oral hypoglycemic drugs notably metformin and Amaryl for most of the duration of her diabetes She was probably started on insulin in 2018 with Levemir insulin when her A1c was 9.6 She has not had an endocrinology follow-up since 03/2017  Recent history:   INSULIN regimen is:  Levemir 12-15 units once a day      Non-insulin hypoglycemic drugs the patient is taking are: Metformin ER 500 mg once or twice a day, Amaryl 2 mg in a.m.  Current management, blood sugar patterns and problems identified:  She is checking her blood sugars somewhat erratically and mostly in the morning, no readings for the last 2 weeks   For quite some time she has not been taking her Levemir consistently as directed and may not take it daily  This is because of her out-of-pocket expense for this is about $300  She is occasionally not taking her insulin also  Although she is supposed to take metformin twice a day she may not take this daily  However she has never been on more than 1000 mg of metformin ER daily  She thinks she is having difficulty keeping up with her diabetes because of her work schedule  Also not doing any exercise; however since January appears to have lost some weight  She is not making any meal plans for her diet and is eating out with fast food at least 4 days a week        Side effects from medications have been: None  Compliance with the medical regimen: Fair Hypoglycemia:   Never  Glucose monitoring:  done <1 times a day         Glucometer:  [employer] brand     Blood Glucose readings by time of day and averages from meter download:  FASTING  blood sugar range 206-321 with evening readings 142-331 and overall average 258  Self-care: The diet that the patient has been following is: tries to limit sugar drinks.     Meal times are: Usually 2 meals a day, variable times  Typical meal intake: Breakfast is eggs and toast              Dietician visit, most recent: Never               Exercise:  None at this time  Weight history:  Wt Readings from Last 3 Encounters:  10/17/17 252 lb 3.2 oz (114.4 kg)  09/07/17 253 lb (114.8 kg)  07/18/17 262 lb (118.8 kg)    Glycemic control:   Lab Results  Component Value Date   HGBA1C 10.1 03/30/2017   HGBA1C 9.6 12/29/2016   HGBA1C 8.4 10/16/2013   Lab Results  Component Value Date   CREATININE 0.92 09/01/2017   No results found for: [facility]  Lab Results  Component Value Date   FRUCTOSAMINE 336 (H) 10/25/2016      Allergies as of 10/17/2017      Reactions   Hydrocodone Hypertension      Medication List  Accurate as of 10/17/17  9:33 PM. Always use your most recent med list.          acetaminophen 500 MG tablet Commonly known as:  TYLENOL Take 500-1,000 mg by mouth every 6 (six) hours as needed for mild pain.   amLODipine 5 MG tablet Commonly known as:  NORVASC Take 2.5 mg by mouth at bedtime.   amLODipine 10 MG tablet Commonly known as:  NORVASC TAKE 1 TABLET EVERY DAY ONCE A DAY ORALLY 90   canagliflozin 300 MG Tabs tablet Commonly known as:  INVOKANA Take 1 tablet (300 mg total) by mouth daily before breakfast.   escitalopram 10 MG tablet Commonly known as:  LEXAPRO   glimepiride 2 MG tablet Commonly known as:  AMARYL Take 2 mg by mouth daily before breakfast.   insulin detemir 100 UNIT/ML injection Commonly known as:  LEVEMIR Inject 12 Units into the skin every morning.   insulin NPH-regular Human (70-30) 100 UNIT/ML injection Commonly known as:  NOVOLIN 70/30 Inject 12 Units into the skin 2 (two) times daily with a meal.   Insulin  Syringe-Needle U-100 30G X 1/2" 0.3 ML Misc Use twice a day with insulin   KLOR-CON M20 20 MEQ tablet Generic drug:  potassium chloride SA   losartan-hydrochlorothiazide 100-25 MG tablet Commonly known as:  HYZAAR Take 1 tablet by mouth every morning.   MELATONIN PO Take by mouth.   metFORMIN 500 MG 24 hr tablet Commonly known as:  GLUCOPHAGE-XR 2 TABLETS ONCE A DAY   metoprolol succinate 100 MG 24 hr tablet Commonly known as:  TOPROL-XL   ONE-A-DAY WOMENS PO Take 1 tablet by mouth daily.   simvastatin 20 MG tablet Commonly known as:  ZOCOR TAKE 1 TABLET BY MOUTH EVERY EVENING AT BEDTIME       Allergies:  Allergies  Allergen Reactions  . Hydrocodone Hypertension    Past Medical History:  Diagnosis Date  . Abnormal EKG    LVH with strain  . Acid reflux   . Depression   . Diabetes mellitus    A1C over 9  . HLD (hyperlipidemia)   . Hypertension   . Noncompliance   . Obesity     Past Surgical History:  Procedure Laterality Date  . COLONOSCOPY WITH PROPOFOL N/A 04/29/2015   Procedure: COLONOSCOPY WITH PROPOFOL;  Surgeon: Garlan Fair, MD;  Location: [facility];  Service: Endoscopy;  Laterality: N/A;  . HYSTEROSCOPY W/D&C N/A 09/07/2017   Procedure: DILATATION AND CURETTAGE /HYSTEROSCOPY;  Surgeon: Sloan Leiter, MD;  Location: [facility];  Service: Gynecology;  Laterality: N/A;  . KNEE ARTHROSCOPY W/ MENISCAL REPAIR Right     Family History  Problem Relation Age of Onset  . Diabetes Neg Hx     Social History:  reports that she has never smoked. She has never used smokeless tobacco. She reports that she does not drink alcohol or use drugs.   Review of Systems  Constitutional: Negative for reduced appetite.  Eyes: Negative for visual disturbance.  Respiratory: Negative for shortness of breath.   Cardiovascular: Positive for leg swelling.  Gastrointestinal: Negative for diarrhea.  Endocrine: Negative for fatigue.  Genitourinary:  Positive for frequency.  Musculoskeletal: Negative for joint pain.  Neurological:       Has mild numbness in her toes     Lipid history:  Treated with Zocor 20 mg daily Her last LDL was 91 done in March 2019   No results found for: CHOL, HDL, LDLCALC, LDLDIRECT,  TRIG, CHOLHDL         Hypertension: Currently on losartan HCT, metoprolol and Norvasc from PCP  BP Readings from Last 3 Encounters:  10/17/17 (!) 142/86  09/07/17 (!) 149/75  07/18/17 (!) 157/77    Most recent eye exam was in 2017  Most recent foot exam: 10/2017   LABS:  No visits with results within 1 Week(s) from this visit.  Latest known visit with results is:  Admission on 09/07/2017, Discharged on 09/07/2017  Component Date Value Ref Range Status  . WBC 09/01/2017 7.1  4.0 - 10.5 K/uL Final  . RBC 09/01/2017 4.19  3.87 - 5.11 MIL/uL Final  . Hemoglobin 09/01/2017 12.6  12.0 - 15.0 g/dL Final  . HCT 09/01/2017 37.5  36.0 - 46.0 % Final  . MCV 09/01/2017 89.5  78.0 - 100.0 fL Final  . MCH 09/01/2017 30.1  26.0 - 34.0 pg Final  . MCHC 09/01/2017 33.6  30.0 - 36.0 g/dL Final  . RDW 09/01/2017 12.8  11.5 - 15.5 % Final  . Platelets 09/01/2017 224  150 - 400 K/uL Final   Performed at [facility], [place] [address] [place], [place] [address]  . Sodium 09/01/2017 139  135 - 145 mmol/L Final  . Potassium 09/01/2017 3.5  3.5 - 5.1 mmol/L Final  . Chloride 09/01/2017 103  101 - 111 mmol/L Final  . CO2 09/01/2017 25  22 - 32 mmol/L Final  . Glucose, Bld 09/01/2017 166* 65 - 99 mg/dL Final  . BUN 09/01/2017 18  6 - 20 mg/dL Final  . Creatinine, Ser 09/01/2017 0.92  0.44 - 1.00 mg/dL Final  . Calcium 09/01/2017 9.3  8.9 - 10.3 mg/dL Final  . GFR calc non Af Amer 09/01/2017 >60  >60 mL/min Final  . GFR calc Af Amer 09/01/2017 >60  >60 mL/min Final   Comment: (NOTE) The eGFR has been calculated using the CKD EPI equation. This calculation has not been validated in all clinical situations. eGFR's persistently <60  mL/min signify possible Chronic Kidney Disease.   Georgiann Hahn gap 09/01/2017 11  5 - 15 Final   Performed at [facility], [place] [address] [place], [place] [address]  . Glucose-Capillary 09/07/2017 128* 65 - 99 mg/dL Final  . Glucose-Capillary 09/07/2017 150* 65 - 99 mg/dL Final    Physical Examination:  BP (!) 142/86 (BP Location: Left Arm, Patient Position: Sitting, Cuff Size: Normal)   Pulse 77   Ht 5' 6" (1.676 m)   Wt 252 lb 3.2 oz (114.4 kg)   LMP 08/02/2013 Comment: spotting  SpO2 97%   BMI 40.71 kg/m   GENERAL:         Patient has generalized obesity.    HEENT:         Eye exam shows normal external appearance.  Fundus exam shows no retinopathy.  Oral exam shows normal mucosa .  NECK:   There is no lymphadenopathy  Thyroid is not enlarged and no nodules felt.  Carotids are normal to palpation and no bruit heard  LUNGS:         Chest is symmetrical. Lungs are clear to auscultation.Marland Kitchen   HEART:         Heart sounds:  S1 and S2 are normal. No murmur or click heard., no S3 or S4.   ABDOMEN:   There is no distention present. Liver and spleen are not palpable.  No other mass or tenderness present.    NEUROLOGICAL:  Ankle jerks are 1+ bilaterally.    Diabetic Foot Exam - Simple   Simple Foot Form Diabetic Foot exam was performed with the following findings:  Yes   Visual Inspection No deformities, no ulcerations, no other skin breakdown bilaterally:  Yes Sensation Testing Intact to touch and monofilament testing bilaterally:  Yes Pulse Check Posterior Tibialis and Dorsalis pulse intact bilaterally:  Yes Comments            Vibration sense is mildly reduced in distal first toes. MUSCULOSKELETAL:  There is no swelling or deformity of the peripheral joints.     EXTREMITIES:     There is no edema.  SKIN:       No rash or lesions of concern.        ASSESSMENT:  Diabetes type 2, uncontrolled with recent A1c 11.8  She is significantly obese and is taking  relatively small doses of insulin and Metformin and Amaryl is likely to be ineffective She is also not taking her medications consistently Currently checking blood sugars only erratically and mostly fasting and these are averaging about 250 But does need more improvement in her lifestyle and weight loss  Complications of diabetes: None evident but needs to have formal eye exam Last microalbumin was normal in 3/19 from PCP  PLAN:    To treat the glucose toxicity from her hyperglycemia we will continue insulin but switch her to 70/30 insulin  This is also to provide mealtime coverage which she is not currently getting  Since her insurance likely covers only syringes and vials will not start her on the FlexPen as yet but discussed use of the syringes to drop her insulin  She needs to draw this up to 30 minutes before eating  She will start with 12 units twice daily and will adjust further on the next visit  We will make referrals for dietitian and nurse educator as she needs significant help with meal planning and knowledge about day-to-day management of diabetes and insulin  Recommended that she start an exercise program and she think she can do it is better when she is able to start working first shift  She is a good candidate for a weight loss inducing diabetes medications such as Invokana  Discussed action of SGLT 2 drugs on lowering glucose by decreasing kidney absorption of glucose, benefits of weight loss and lower blood pressure, possible side effects including candidiasis and dosage regimen.  Given patient information and co-pay card  Reassured her that the benefits of far exceeding any possible side effects  She will start with 100 mg daily, discussed possible need for treatment periodically with Diflucan if she has candidiasis  Since her blood pressure is been consistently high even with her PCP will not change her antihypertensives as yet  METFORMIN will be increased up  to 3 tablets daily for the next 3-4 weeks and consider 4 tablets daily  Needs short-term follow-up in 4 weeks  She will need to check with her insurance company about the preferred glucose meter and switch to this  There are no Patient Instructions on file for this visit.  Counseling time on subjects discussed in assessment and plan sections is over 50% of today's 40 minute visit  Consultation note has been sent to the referring physician  Elayne Snare 10/17/2017, 9:33 PM   Note: This office note was prepared with Dragon voice recognition system technology. Any transcriptional errors that result from this process are unintentional.

## 2017-11-03 DIAGNOSIS — I10 Essential (primary) hypertension: Secondary | ICD-10-CM | POA: Diagnosis not present

## 2017-11-03 DIAGNOSIS — R110 Nausea: Secondary | ICD-10-CM | POA: Diagnosis not present

## 2017-11-14 ENCOUNTER — Other Ambulatory Visit (INDEPENDENT_AMBULATORY_CARE_PROVIDER_SITE_OTHER): Payer: BLUE CROSS/BLUE SHIELD

## 2017-11-14 DIAGNOSIS — E1165 Type 2 diabetes mellitus with hyperglycemia: Secondary | ICD-10-CM

## 2017-11-14 DIAGNOSIS — Z794 Long term (current) use of insulin: Secondary | ICD-10-CM

## 2017-11-14 LAB — BASIC METABOLIC PANEL
BUN: 18 mg/dL (ref 6–23)
CALCIUM: 9.2 mg/dL (ref 8.4–10.5)
CHLORIDE: 104 meq/L (ref 96–112)
CO2: 29 meq/L (ref 19–32)
Creatinine, Ser: 0.77 mg/dL (ref 0.40–1.20)
GFR: 99.47 mL/min (ref >60.00)
Glucose, Bld: 172 mg/dL — ABNORMAL HIGH (ref 70–99)
Potassium: 3.2 mEq/L — ABNORMAL LOW (ref 3.5–5.1)
Sodium: 140 mEq/L (ref 135–145)

## 2017-11-15 LAB — FRUCTOSAMINE: Fructosamine: 361 umol/L — ABNORMAL HIGH (ref 0–285)

## 2017-11-15 NOTE — Progress Notes (Signed)
Patient ID: Shannon Oliver, female   DOB: [birth year], 57 y.o.   MRN: [id number]          Reason for Appointment: Follow-up for Type 2 Diabetes  Referring physician: Dr. Fara Olden   History of Present Illness:          Date of diagnosis of type 2 diabetes mellitus: 2008       Background history:   She thinks she has been on mostly oral hypoglycemic drugs notably metformin and Amaryl for most of the duration of her diabetes She was probably started on insulin in 2018 with Levemir insulin when her A1c was 9.6 She has not had an endocrinology follow-up since 03/2017  Recent history:   INSULIN regimen is:  Novolin 70/30, 12 units bid   Non-insulin hypoglycemic drugs the patient is taking are: Metformin ER 500 mg once or twice a day, Amaryl 2 mg in a.m.  Patient works third shift  Her last A1c was 11.0 done by her PCP in March  Fructosamine now is 361  Current management, blood sugar patterns and problems identified:  Her glucose monitor has the wrong date and time and not clear what her recent blood sugars are  On her initial visit she was switched from Levemir to Garza twice a day and she has taken this regularly before meals as directed  She is checking her blood sugars mostly in the morning when she comes back from work and not after meals  Previously her blood sugars were averaging 258 prior to her last visit  She was tried on Invokana but she thinks that it made her feel dizzy and she was having sharp pains in her side, she stopped this after about a week or 2 and did not notify us, also was not checking her blood pressure at that time  She has however increase her metformin to 3 times a day without side effects  Review of recent blood sugars indicate that her blood sugars are probably mostly in the 150-200 range with some lower readings  She has had hypoglycemic symptoms only once during the day when she was late for her meals  She was told to see the dietitian  and nurse educator but she did not want to make appointments and is trying on her own to cut back on portions and high-fat foods  She also has started doing a little walking as recommended  Her weight has leveled off even with better blood sugar levels overall        Side effects from medications have been: Dizziness and sharp site pains from Invokana  Compliance with the medical regimen: Fair Hypoglycemia:   As above  Glucose monitoring:  done <1 times a day         Glucometer:  [employer] brand     Blood Glucose readings by review of monitor as above  Self-care: The diet that the patient has been following is: tries to limit sugar drinks.     Meal times are: Usually 2 meals a day, variable times  Typical meal intake: Breakfast is eggs and toast              Dietician visit, most recent: Never               Exercise:  walking pumps  Weight history:  Wt Readings from Last 3 Encounters:  11/16/17 253 lb 6.4 oz (114.9 kg)  10/17/17 252 lb 3.2 oz (114.4 kg)  09/07/17 253 lb (114.8 kg)  Glycemic control:   Lab Results  Component Value Date   HGBA1C 10.1 03/30/2017   HGBA1C 9.6 12/29/2016   HGBA1C 8.4 10/16/2013   Lab Results  Component Value Date   CREATININE 0.77 11/14/2017   No results found for: MICRALBCREAT  Lab Results  Component Value Date   FRUCTOSAMINE 361 (H) 11/14/2017   FRUCTOSAMINE 336 (H) 10/25/2016      Allergies as of 11/16/2017      Reactions   Hydrocodone Hypertension      Medication List        Accurate as of 11/16/17  4:33 PM. Always use your most recent med list.          acetaminophen 500 MG tablet Commonly known as:  TYLENOL Take 500-1,000 mg by mouth every 6 (six) hours as needed for mild pain.   amLODipine 5 MG tablet Commonly known as:  NORVASC Take 2.5 mg by mouth at bedtime.   amLODipine 10 MG tablet Commonly known as:  NORVASC TAKE 1 TABLET EVERY DAY ONCE A DAY ORALLY 90   escitalopram 10 MG tablet Commonly known as:   LEXAPRO   glimepiride 2 MG tablet Commonly known as:  AMARYL Take 2 mg by mouth daily before breakfast.   insulin aspart protamine - aspart (70-30) 100 UNIT/ML FlexPen Commonly known as:  NOVOLOG MIX 70/30 FLEXPEN Inject 0.2 mLs (20 Units total) into the skin 2 (two) times daily.   insulin NPH-regular Human (70-30) 100 UNIT/ML injection Commonly known as:  NOVOLIN 70/30 Inject 12 Units into the skin 2 (two) times daily with a meal.   Insulin Syringe-Needle U-100 30G X 1/2" 0.3 ML Misc Use twice a day with insulin   KLOR-CON M20 20 MEQ tablet Generic drug:  potassium chloride SA 1 tab daily   losartan-hydrochlorothiazide 100-25 MG tablet Commonly known as:  HYZAAR Take 1 tablet by mouth every morning.   MELATONIN PO Take by mouth.   metFORMIN 500 MG 24 hr tablet Commonly known as:  GLUCOPHAGE-XR 2 TABLETS ONCE A DAY   metoprolol succinate 100 MG 24 hr tablet Commonly known as:  TOPROL-XL   ONE-A-DAY WOMENS PO Take 1 tablet by mouth daily.   simvastatin 20 MG tablet Commonly known as:  ZOCOR TAKE 1 TABLET BY MOUTH EVERY EVENING AT BEDTIME       Allergies:  Allergies  Allergen Reactions  . Hydrocodone Hypertension    Past Medical History:  Diagnosis Date  . Abnormal EKG    LVH with strain  . Acid reflux   . Depression   . Diabetes mellitus    A1C over 9  . HLD (hyperlipidemia)   . Hypertension   . Noncompliance   . Obesity     Past Surgical History:  Procedure Laterality Date  . COLONOSCOPY WITH PROPOFOL N/A 04/29/2015   Procedure: COLONOSCOPY WITH PROPOFOL;  Surgeon: Garlan Fair, MD;  Location: [facility];  Service: Endoscopy;  Laterality: N/A;  . HYSTEROSCOPY W/D&C N/A 09/07/2017   Procedure: DILATATION AND CURETTAGE /HYSTEROSCOPY;  Surgeon: Sloan Leiter, MD;  Location: [facility];  Service: Gynecology;  Laterality: N/A;  . KNEE ARTHROSCOPY W/ MENISCAL REPAIR Right     Family History  Problem Relation Age of Onset  .  Diabetes Neg Hx     Social History:  reports that she has never smoked. She has never used smokeless tobacco. She reports that she does not drink alcohol or use drugs.   Review of Systems   Lipid history:  Treated with Zocor 20 mg daily Her last LDL was 91 done in March 2019   No results found for: CHOL, HDL, LDLCALC, LDLDIRECT, TRIG, CHOLHDL         Hypertension: Currently on losartan HCT, metoprolol and Norvasc from PCP Blood pressure appears improved  BP Readings from Last 3 Encounters:  11/16/17 140/80  10/17/17 (!) 142/86  09/07/17 (!) 149/75    Hypokalemia:  She has a previous prescription for potassium but she has not taken it and is having some muscle cramping Potassium is low now  Lab Results  Component Value Date   K 3.2 (L) 11/14/2017     Most recent eye exam was in 2017  Most recent foot exam: 10/2017   LABS:  Lab on 11/14/2017  Component Date Value Ref Range Status  . Fructosamine 11/14/2017 361* 0 - 285 umol/L Final   Comment: Published reference interval for apparently healthy subjects between age 50 and 22 is 74 - 285 umol/L and in a poorly controlled diabetic population is 228 - 563 umol/L with a mean of 396 umol/L.   Marland Kitchen Sodium 11/14/2017 140  135 - 145 mEq/L Final  . Potassium 11/14/2017 3.2* 3.5 - 5.1 mEq/L Final  . Chloride 11/14/2017 104  96 - 112 mEq/L Final  . CO2 11/14/2017 29  19 - 32 mEq/L Final  . Glucose, Bld 11/14/2017 172* 70 - 99 mg/dL Final  . BUN 11/14/2017 18  6 - 23 mg/dL Final  . Creatinine, Ser 11/14/2017 0.77  0.40 - 1.20 mg/dL Final  . Calcium 11/14/2017 9.2  8.4 - 10.5 mg/dL Final  . GFR 11/14/2017 99.47  >60.00 mL/min Final    Physical Examination:  BP 140/80 (BP Location: Left Arm, Patient Position: Sitting, Cuff Size: Normal)   Pulse 86   Ht 5\' 6"  (1.676 m)   Wt 253 lb 6.4 oz (114.9 kg)   LMP 08/02/2013 Comment: spotting  SpO2 97%   BMI 40.90 kg/m         ASSESSMENT:  Diabetes type 2, insulin  requiring  See history of present illness for detailed discussion of current diabetes management, blood sugar patterns and problems identified  Currently on premixed insulin, metformin and Amaryl  Although difficult to know what her blood sugars are doing since her monitor has an correct date and time her blood sugars appear to be overall better at home with readings mostly below 200 compared to previous average of 258 Fructosamine also indicates some improvement in control  She is benefiting from using premixed insulin instead of only basal insulin as well as increasing metformin by 500 mg  She has been reluctant to see the dietitian but she thinks she is trying to make changes in her diet as discussed before with cutting back on portions and high-fat foods Has no recent weight gain despite improved blood sugars  Although she should benefit from [place] as she is reluctant to take it as she is convinced that she had pains in her sides from this and dizziness which was possibly from lower blood pressure   PLAN:    Discussed in detail the need for more accurate glucose monitoring and use of a brand-name meter that will be accurate, affordable and also allow [place] to download the readings Since she is not clear if her insurance is covering any glucose meter she can use the Accu-Chek guide which was given to her Discussed timing of glucose monitoring with some readings after meals and some fasting when she  wakes up She needs to eat on time Since she is wanting to switch to an insulin pen and does not like the syringes she will switch to NovoLog mix 70/30 but not clear if she will have adequate coverage for this She also needs to be scheduled to see nutritionist but she is concerned about the cost  For now will not change her insulin dose but review her blood sugar patterns when she comes in for follow-up A1c to be done on the next visit Needs regular walking for exercise  Also reminded her  about getting eye exam  HYPOKALEMIA: She will start potassium supplement which was prescribed again and needs to continue this as long as she is on losartan and HCTZ from her PCP   Patient Instructions  Check blood sugars on waking up  2-3/7  Also check blood sugars about 2 hours after a meal and do this after different meals by rotation  Recommended blood sugar levels on waking up is 90-130 and about 2 hours after meal is 130-160  Please bring your blood sugar monitor to each visit, thank you     Counseling time on subjects discussed in assessment and plan sections is over 50% of today's 25 minute visit    Elayne Snare 11/16/2017, 4:33 PM   Note: This office note was prepared with Dragon voice recognition system technology. Any transcriptional errors that result from this process are unintentional.

## 2017-11-16 ENCOUNTER — Encounter: Payer: Self-pay | Admitting: Endocrinology

## 2017-11-16 ENCOUNTER — Ambulatory Visit (INDEPENDENT_AMBULATORY_CARE_PROVIDER_SITE_OTHER): Payer: BLUE CROSS/BLUE SHIELD | Admitting: Endocrinology

## 2017-11-16 ENCOUNTER — Other Ambulatory Visit: Payer: Self-pay

## 2017-11-16 VITALS — BP 140/80 | HR 86 | Ht 66.0 in | Wt 253.4 lb

## 2017-11-16 DIAGNOSIS — I10 Essential (primary) hypertension: Secondary | ICD-10-CM

## 2017-11-16 DIAGNOSIS — Z794 Long term (current) use of insulin: Secondary | ICD-10-CM

## 2017-11-16 DIAGNOSIS — E1165 Type 2 diabetes mellitus with hyperglycemia: Secondary | ICD-10-CM | POA: Diagnosis not present

## 2017-11-16 DIAGNOSIS — E876 Hypokalemia: Secondary | ICD-10-CM

## 2017-11-16 MED ORDER — INSULIN ASPART PROT & ASPART (70-30 MIX) 100 UNIT/ML PEN: 20.0000 [IU] | PEN_INJECTOR | Freq: Two times a day (BID) | SUBCUTANEOUS | 1 refills | Status: DC

## 2017-11-16 MED ORDER — GLUCOSE BLOOD VI STRP: ORAL_STRIP | 12 refills | Status: DC

## 2017-11-16 MED ORDER — KLOR-CON M20 20 MEQ PO TBCR: EXTENDED_RELEASE_TABLET | ORAL | 3 refills | Status: DC

## 2017-11-16 MED ORDER — INSULIN PEN NEEDLE 29G X 5MM MISC: 1.0000 | Freq: Every day | 3 refills | Status: DC

## 2017-11-16 NOTE — Patient Instructions (Signed)
Check blood sugars on waking up  2-3/7  Also check blood sugars about 2 hours after a meal and do this after different meals by rotation  Recommended blood sugar levels on waking up is 90-130 and about 2 hours after meal is 130-160  Please bring your blood sugar monitor to each visit, thank you   

## 2017-11-21 DIAGNOSIS — M62830 Muscle spasm of back: Secondary | ICD-10-CM | POA: Diagnosis not present

## 2017-11-21 DIAGNOSIS — E118 Type 2 diabetes mellitus with unspecified complications: Secondary | ICD-10-CM | POA: Diagnosis not present

## 2018-01-13 ENCOUNTER — Other Ambulatory Visit: Payer: BLUE CROSS/BLUE SHIELD

## 2018-01-16 ENCOUNTER — Ambulatory Visit: Payer: BLUE CROSS/BLUE SHIELD | Admitting: Endocrinology

## 2018-03-08 ENCOUNTER — Other Ambulatory Visit (INDEPENDENT_AMBULATORY_CARE_PROVIDER_SITE_OTHER): Payer: BLUE CROSS/BLUE SHIELD

## 2018-03-08 DIAGNOSIS — E1165 Type 2 diabetes mellitus with hyperglycemia: Secondary | ICD-10-CM | POA: Diagnosis not present

## 2018-03-08 DIAGNOSIS — Z794 Long term (current) use of insulin: Secondary | ICD-10-CM

## 2018-03-08 LAB — COMPREHENSIVE METABOLIC PANEL
ALT: 18 U/L (ref 0–35)
AST: 17 U/L (ref 0–37)
Albumin: 4.2 g/dL (ref 3.5–5.2)
Alkaline Phosphatase: 61 U/L (ref 39–117)
BILIRUBIN TOTAL: 0.4 mg/dL (ref 0.2–1.2)
BUN: 19 mg/dL (ref 6–23)
CALCIUM: 9.5 mg/dL (ref 8.4–10.5)
CHLORIDE: 101 meq/L (ref 96–112)
CO2: 30 mEq/L (ref 19–32)
CREATININE: 0.97 mg/dL (ref 0.40–1.20)
GFR: 76.12 mL/min (ref >60.00)
Glucose, Bld: 449 mg/dL — ABNORMAL HIGH (ref 70–99)
Potassium: 3.7 mEq/L (ref 3.5–5.1)
Sodium: 136 mEq/L (ref 135–145)
Total Protein: 7.7 g/dL (ref 6.0–8.3)

## 2018-03-08 LAB — HEMOGLOBIN A1C: Hgb A1c MFr Bld: 10.5 % — ABNORMAL HIGH (ref 4.6–6.5)

## 2018-03-13 ENCOUNTER — Encounter: Payer: Self-pay | Admitting: Endocrinology

## 2018-03-13 ENCOUNTER — Ambulatory Visit: Payer: BLUE CROSS/BLUE SHIELD | Admitting: Endocrinology

## 2018-03-13 VITALS — BP 150/108 | HR 75 | Ht 66.0 in | Wt 251.0 lb

## 2018-03-13 DIAGNOSIS — Z794 Long term (current) use of insulin: Secondary | ICD-10-CM

## 2018-03-13 DIAGNOSIS — I10 Essential (primary) hypertension: Secondary | ICD-10-CM | POA: Diagnosis not present

## 2018-03-13 DIAGNOSIS — E1165 Type 2 diabetes mellitus with hyperglycemia: Secondary | ICD-10-CM

## 2018-03-13 MED ORDER — FREESTYLE LIBRE 14 DAY READER DEVI: 1.0000 | Freq: Once | 0 refills | Status: AC

## 2018-03-13 MED ORDER — FREESTYLE LIBRE 14 DAY SENSOR MISC: 1.0000 [IU] | 4 refills | Status: DC

## 2018-03-13 MED ORDER — AMLODIPINE BESYLATE 10 MG PO TABS: ORAL_TABLET | ORAL | 1 refills | Status: DC

## 2018-03-13 MED ORDER — INSULIN ASPART PROT & ASPART (70-30 MIX) 100 UNIT/ML PEN: 20.0000 [IU] | PEN_INJECTOR | Freq: Two times a day (BID) | SUBCUTANEOUS | 1 refills | Status: DC

## 2018-03-13 MED ORDER — INSULIN PEN NEEDLE 31G X 5 MM MISC: 1 refills | Status: DC

## 2018-03-13 NOTE — Patient Instructions (Addendum)
Check blood sugars on waking up  daily   Also check blood sugars about 2 hours after a meal and do this after different meals by rotation  Recommended blood sugar levels on waking up is 90-130 and about 2 hours after meal is 130-160  Please bring your blood sugar monitor to each visit, thank you  Take 2 metformin at dinner or 3 daily  start with 15 units twice a day before breakfast and suppertime and after 1 week if your sugars are still more than 200 go to 20 units  Take 10mg  amlodipine

## 2018-03-13 NOTE — Progress Notes (Signed)
Patient ID: Shannon Oliver, female   DOB: [birth year], 57 y.o.   MRN: [id number]          Reason for Appointment: Follow-up for Type 2 Diabetes  Referring physician: Dr. Fara Olden   History of Present Illness:          Date of diagnosis of type 2 diabetes mellitus: 2008       Background history:   She thinks she has been on mostly oral hypoglycemic drugs notably metformin and Amaryl for most of the duration of her diabetes She was probably started on insulin in 2018 with Levemir insulin when her A1c was 9.6 She has not had an endocrinology follow-up since 03/2017  Recent history:   INSULIN regimen is:  Novolin 70/30, 10 units occasionally  Non-insulin hypoglycemic drugs the patient is taking are: Metformin ER 500 mg bid, Amaryl 2 mg in a.m.  Patient works third shift  A1c is now 10.5, previously 11   Current management, blood sugar patterns and problems identified:  She did not follow-up after her visit in 5/19  Although she was prescribed a FlexPen for her insulin she says her pharmacy did not give this to her  She does not like to do the injections with her syringe because the needle is too large and painful  She stopped taking her insulin and is taking that only rarely when blood sugar is very high  She did not report her problems with insulin and blood sugars appear to be well over 300 now  She is also not taking her Amaryl except last few days  She has started using the Accu-Chek recently  She was told to see the dietitian and nurse educator and still has not made appointments  Her weight has leveled off        Side effects from medications have been: Dizziness and sharp side pains from Invokana  Compliance with the medical regimen: Poor Hypoglycemia: Never  Glucose monitoring:  done <1 times a day         Glucometer:  Accu-Chek brand     Blood Glucose readings by review of monitor show average blood sugar 325 with 6 readings  Morning 382 Midday  282 4 PM = 265-350 and 7 PM = 322  Self-care: The diet that the patient has been following is: tries to limit sugar drinks.     Meal times are: Usually 2 meals a day, variable times  Typical meal intake: Breakfast is eggs and toast              Dietician visit, most recent: Never               Exercise:  walking   Weight history:  Wt Readings from Last 3 Encounters:  03/13/18 251 lb (113.9 kg)  11/16/17 253 lb 6.4 oz (114.9 kg)  10/17/17 252 lb 3.2 oz (114.4 kg)    Glycemic control:   Lab Results  Component Value Date   HGBA1C 10.5 (H) 03/08/2018   HGBA1C 10.1 03/30/2017   HGBA1C 9.6 12/29/2016   Lab Results  Component Value Date   CREATININE 0.97 03/08/2018   No results found for: [facility]  Lab Results  Component Value Date   FRUCTOSAMINE 361 (H) 11/14/2017   FRUCTOSAMINE 336 (H) 10/25/2016      Allergies as of 03/13/2018      Reactions   Hydrocodone Hypertension      Medication List        Accurate as of 03/13/18  5:02 PM. Always use your most recent med list.          acetaminophen 500 MG tablet Commonly known as:  TYLENOL Take 500-1,000 mg by mouth every 6 (six) hours as needed for mild pain.   amLODipine 10 MG tablet Commonly known as:  NORVASC TAKE 1 TABLET EVERY DAY ONCE A DAY ORALLY 90   escitalopram 10 MG tablet Commonly known as:  LEXAPRO   FREESTYLE LIBRE 14 DAY READER Devi 1 Device by Does not apply route once for 1 dose.   FREESTYLE LIBRE 14 DAY SENSOR Misc 1 Units by Does not apply route every 14 (fourteen) days.   glimepiride 2 MG tablet Commonly known as:  AMARYL Take 2 mg by mouth daily before breakfast.   glucose blood test strip Use as instructed to check blood sugar one time daily.   insulin aspart protamine - aspart (70-30) 100 UNIT/ML FlexPen Commonly known as:  NOVOLOG 70/30 MIX Inject 0.2 mLs (20 Units total) into the skin 2 (two) times daily.   Insulin Pen Needle 31G X 5 MM Misc Use bid on pens   Insulin  Syringe-Needle U-100 30G X 1/2" 0.3 ML Misc Use twice a day with insulin   KLOR-CON M20 20 MEQ tablet Generic drug:  potassium chloride SA 1 tab daily   losartan-hydrochlorothiazide 100-25 MG tablet Commonly known as:  HYZAAR Take 1 tablet by mouth every morning.   MELATONIN PO Take by mouth.   metFORMIN 500 MG 24 hr tablet Commonly known as:  GLUCOPHAGE-XR 2 TABLETS ONCE A DAY   metoprolol succinate 100 MG 24 hr tablet Commonly known as:  TOPROL-XL   ONE-A-DAY WOMENS PO Take 1 tablet by mouth daily.   simvastatin 20 MG tablet Commonly known as:  ZOCOR TAKE 1 TABLET BY MOUTH EVERY EVENING AT BEDTIME       Allergies:  Allergies  Allergen Reactions  . Hydrocodone Hypertension    Past Medical History:  Diagnosis Date  . Abnormal EKG    LVH with strain  . Acid reflux   . Depression   . Diabetes mellitus    A1C over 9  . HLD (hyperlipidemia)   . Hypertension   . Noncompliance   . Obesity     Past Surgical History:  Procedure Laterality Date  . COLONOSCOPY WITH PROPOFOL N/A 04/29/2015   Procedure: COLONOSCOPY WITH PROPOFOL;  Surgeon: Garlan Fair, MD;  Location: [facility];  Service: Endoscopy;  Laterality: N/A;  . HYSTEROSCOPY W/D&C N/A 09/07/2017   Procedure: DILATATION AND CURETTAGE /HYSTEROSCOPY;  Surgeon: Sloan Leiter, MD;  Location: [place];  Service: Gynecology;  Laterality: N/A;  . KNEE ARTHROSCOPY W/ MENISCAL REPAIR Right     Family History  Problem Relation Age of Onset  . Diabetes Neg Hx     Social History:  reports that she has never smoked. She has never used smokeless tobacco. She reports that she does not drink alcohol or use drugs.   Review of Systems   Lipid history:  Treated with Zocor 20 mg daily Her last LDL was 91 done in March 2019   No results found for: CHOL, HDL, LDLCALC, LDLDIRECT, TRIG, CHOLHDL         Hypertension: Currently on losartan HCT, metoprolol and Norvasc from PCP Blood pressure appears  markedly increased After questioning a couple of times patient admits not refilling her Norvasc but was taking only 5 mg She has not checked blood pressure at home lately  BP Readings  from Last 3 Encounters:  03/13/18 (!) 150/108  11/16/17 140/80  10/17/17 (!) 142/86    Hypokalemia:    Potassium is ok now without supplement  Lab Results  Component Value Date   K 3.7 03/08/2018     Most recent eye exam was in 2017  Most recent foot exam: 10/2017   LABS:  Lab on 03/08/2018  Component Date Value Ref Range Status  . Sodium 03/08/2018 136  135 - 145 mEq/L Final  . Potassium 03/08/2018 3.7  3.5 - 5.1 mEq/L Final  . Chloride 03/08/2018 101  96 - 112 mEq/L Final  . CO2 03/08/2018 30  19 - 32 mEq/L Final  . Glucose, Bld 03/08/2018 449* 70 - 99 mg/dL Final  . BUN 03/08/2018 19  6 - 23 mg/dL Final  . Creatinine, Ser 03/08/2018 0.97  0.40 - 1.20 mg/dL Final  . Total Bilirubin 03/08/2018 0.4  0.2 - 1.2 mg/dL Final  . Alkaline Phosphatase 03/08/2018 61  39 - 117 U/L Final  . AST 03/08/2018 17  0 - 37 U/L Final  . ALT 03/08/2018 18  0 - 35 U/L Final  . Total Protein 03/08/2018 7.7  6.0 - 8.3 g/dL Final  . Albumin 03/08/2018 4.2  3.5 - 5.2 g/dL Final  . Calcium 03/08/2018 9.5  8.4 - 10.5 mg/dL Final  . GFR 03/08/2018 76.12  >60.00 mL/min Final  . Hgb A1c MFr Bld 03/08/2018 10.5* 4.6 - 6.5 % Final   Glycemic Control Guidelines for People with Diabetes:Non Diabetic:  <6%Goal of Therapy: <7%Additional Action Suggested:  >8%     Physical Examination:  BP (!) 150/108 (Cuff Size: Large)   Pulse 75   Ht 5\' 6"  (1.676 m)   Wt 251 lb (113.9 kg)   LMP 08/02/2013 Comment: spotting  SpO2 96%   BMI 40.51 kg/m         ASSESSMENT:  Diabetes type 2, uncontrolled  See history of present illness for detailed discussion of current diabetes management, blood sugar patterns and problems identified  Her A1c is consistently over 10% and now 10.5  She has not taking her insulin and her  blood sugars are as high as 449 and also over 300 at home frequently Although she was supposed to take her insulin with the FlexPen she has not taken an insulin and has taken mostly metformin since her last visit Discussed what her blood sugar targets should be and she is needing to be much more motivated to improve her control, check her sugar and consistently watch her diet and exercise  HYPERTENSION: This is poorly controlled and not clear why, may be from her not taking amlodipine, not clear if she is taking her other medication regularly as blood pressure is higher than usual   PLAN:   Will need to start her on consistent doses of insulin to relieve glucose toxicity She will start with 15 units of the 70/30 insulin twice daily for convenience New prescription for the FlexPen and 31-gauge pen needles given After 1 week she will go up to 20 units if needed twice a day at least She will continue Amaryl and also try to take an extra metformin daily to increase the dose to 1500 mg For better compliance with glucose monitoring which is totally inadequate currently she will try to use the freestyle libre sensor and discussed how this works She will ask the nurse educator if she has any difficulty starting this Compliance and progress will be reviewed by nurse educator  She will need to start taking her amlodipine regularly and prescription sent for 10 mg dosage   Patient Instructions  Check blood sugars on waking up  daily   Also check blood sugars about 2 hours after a meal and do this after different meals by rotation  Recommended blood sugar levels on waking up is 90-130 and about 2 hours after meal is 130-160  Please bring your blood sugar monitor to each visit, thank you  Take 2 metformin at dinner or 3 daily  start with 15 units twice a day before breakfast and suppertime and after 1 week if your sugars are still more than 200 go to 20 units  Take 10mg   amlodipine    Counseling time on subjects discussed in assessment and plan sections is over 50% of today's 25 minute visit    Elayne Snare 03/13/2018, 5:02 PM   Note: This office note was prepared with Dragon voice recognition system technology. Any transcriptional errors that result from this process are unintentional.

## 2018-03-16 DIAGNOSIS — Z23 Encounter for immunization: Secondary | ICD-10-CM | POA: Diagnosis not present

## 2018-03-16 DIAGNOSIS — I10 Essential (primary) hypertension: Secondary | ICD-10-CM | POA: Diagnosis not present

## 2018-04-04 ENCOUNTER — Other Ambulatory Visit: Payer: Self-pay | Admitting: Endocrinology

## 2018-04-06 DIAGNOSIS — Z1159 Encounter for screening for other viral diseases: Secondary | ICD-10-CM | POA: Diagnosis not present

## 2018-04-13 ENCOUNTER — Other Ambulatory Visit (INDEPENDENT_AMBULATORY_CARE_PROVIDER_SITE_OTHER): Payer: BLUE CROSS/BLUE SHIELD

## 2018-04-13 DIAGNOSIS — E1165 Type 2 diabetes mellitus with hyperglycemia: Secondary | ICD-10-CM | POA: Diagnosis not present

## 2018-04-13 DIAGNOSIS — Z794 Long term (current) use of insulin: Secondary | ICD-10-CM

## 2018-04-13 LAB — LIPID PANEL
Cholesterol: 156 mg/dL (ref 0–200)
HDL: 46.3 mg/dL (ref >39.00)
LDL Cholesterol: 82 mg/dL (ref 0–99)
NonHDL: 109.75
TRIGLYCERIDES: 139 mg/dL (ref 0.0–149.0)
Total CHOL/HDL Ratio: 3
VLDL: 27.8 mg/dL (ref 0.0–40.0)

## 2018-04-13 LAB — BASIC METABOLIC PANEL
BUN: 20 mg/dL (ref 6–23)
CO2: 31 meq/L (ref 19–32)
Calcium: 9.7 mg/dL (ref 8.4–10.5)
Chloride: 100 mEq/L (ref 96–112)
Creatinine, Ser: 0.85 mg/dL (ref 0.40–1.20)
GFR: 88.62 mL/min (ref >60.00)
Glucose, Bld: 296 mg/dL — ABNORMAL HIGH (ref 70–99)
POTASSIUM: 3.4 meq/L — AB (ref 3.5–5.1)
Sodium: 137 mEq/L (ref 135–145)

## 2018-04-14 LAB — FRUCTOSAMINE: FRUCTOSAMINE: 394 umol/L — AB (ref 0–285)

## 2018-04-17 ENCOUNTER — Other Ambulatory Visit: Payer: Self-pay | Admitting: Nurse Practitioner

## 2018-04-17 ENCOUNTER — Ambulatory Visit
Admission: RE | Admit: 2018-04-17 | Discharge: 2018-04-17 | Disposition: A | Discharge status: Home / Self Care | Payer: BLUE CROSS/BLUE SHIELD | Source: Ambulatory Visit | Attending: Nurse Practitioner | Admitting: Nurse Practitioner

## 2018-04-17 DIAGNOSIS — J322 Chronic ethmoidal sinusitis: Secondary | ICD-10-CM | POA: Diagnosis not present

## 2018-04-17 DIAGNOSIS — R05 Cough: Secondary | ICD-10-CM | POA: Diagnosis not present

## 2018-04-17 DIAGNOSIS — R0683 Snoring: Secondary | ICD-10-CM | POA: Diagnosis not present

## 2018-04-17 DIAGNOSIS — R599 Enlarged lymph nodes, unspecified: Secondary | ICD-10-CM

## 2018-04-17 DIAGNOSIS — I10 Essential (primary) hypertension: Secondary | ICD-10-CM | POA: Diagnosis not present

## 2018-04-17 DIAGNOSIS — J020 Streptococcal pharyngitis: Secondary | ICD-10-CM | POA: Diagnosis not present

## 2018-04-17 IMAGING — CR DG CHEST 2V
2 series · 2 of 2 positions shown · non-contrast
Comparison: PA and lateral chest x-ray [DATE]

CLINICAL DATA: Chronic chest congestion which has increased in
severity over the past several days with development of an
intermittent productive cough and wheezing. Also pleuritic chest
pain and night sweats.

EXAM:
CHEST - 2 VIEW

[w chest pa]
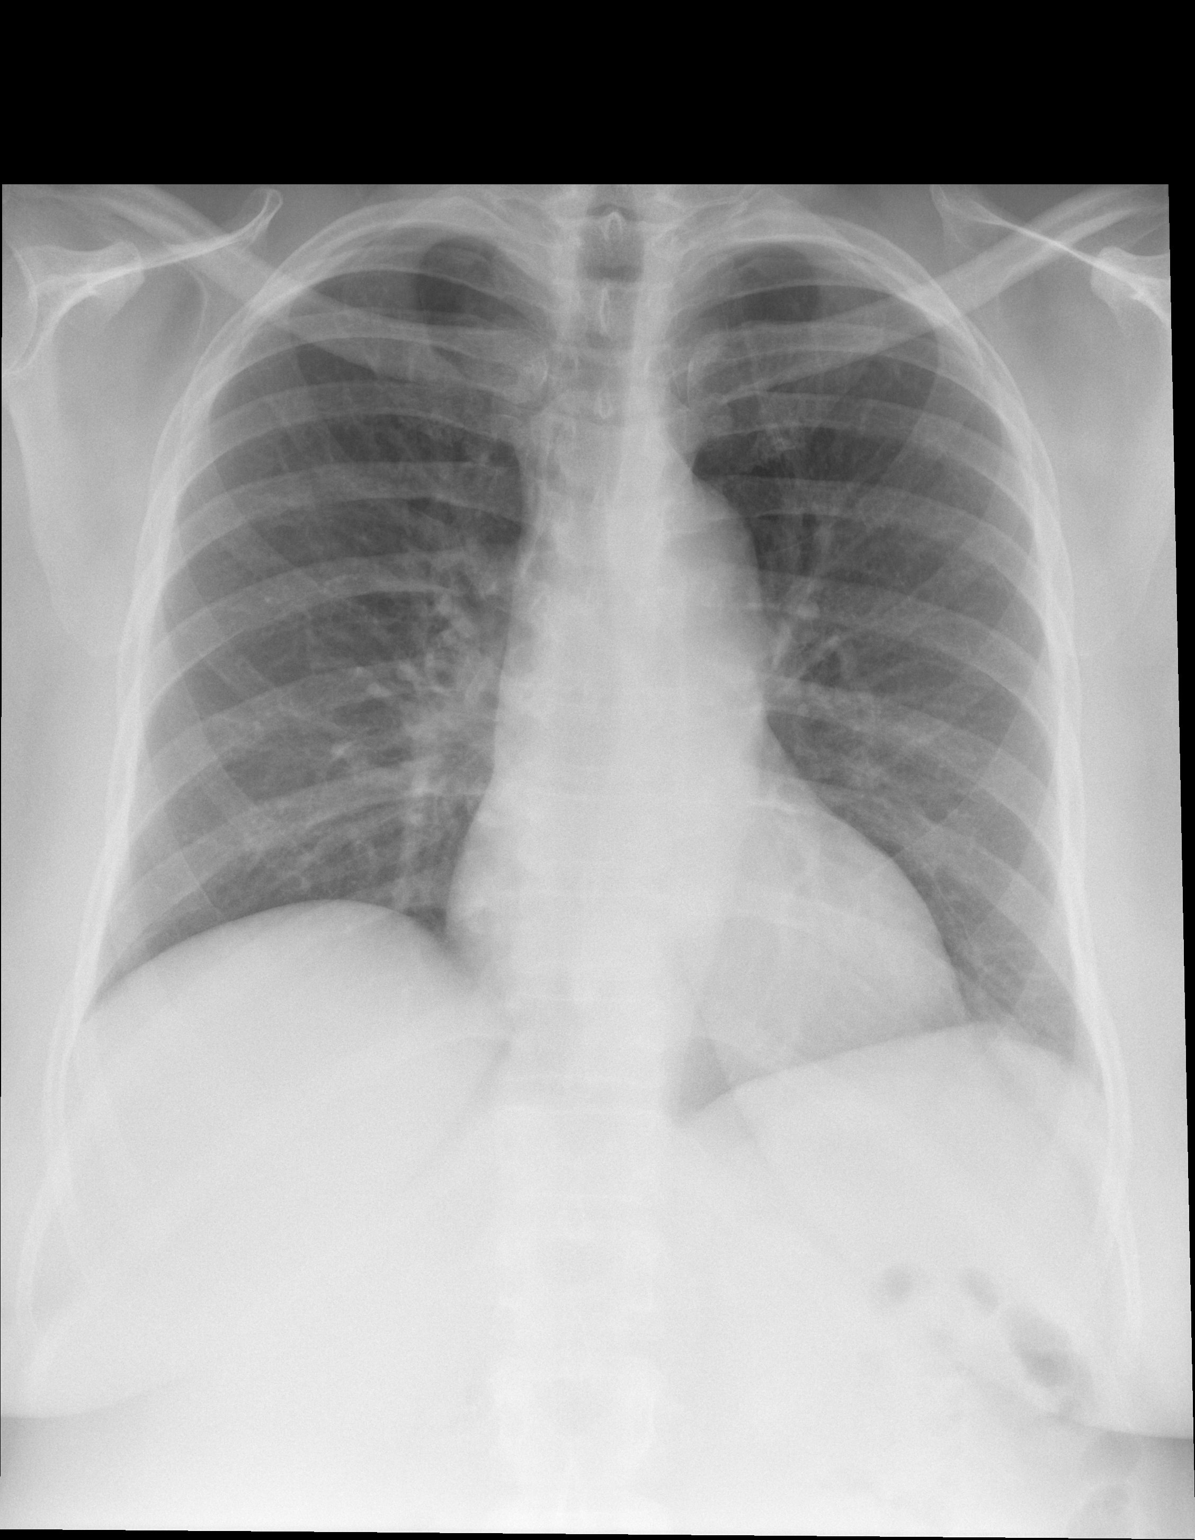

[w chest lat]
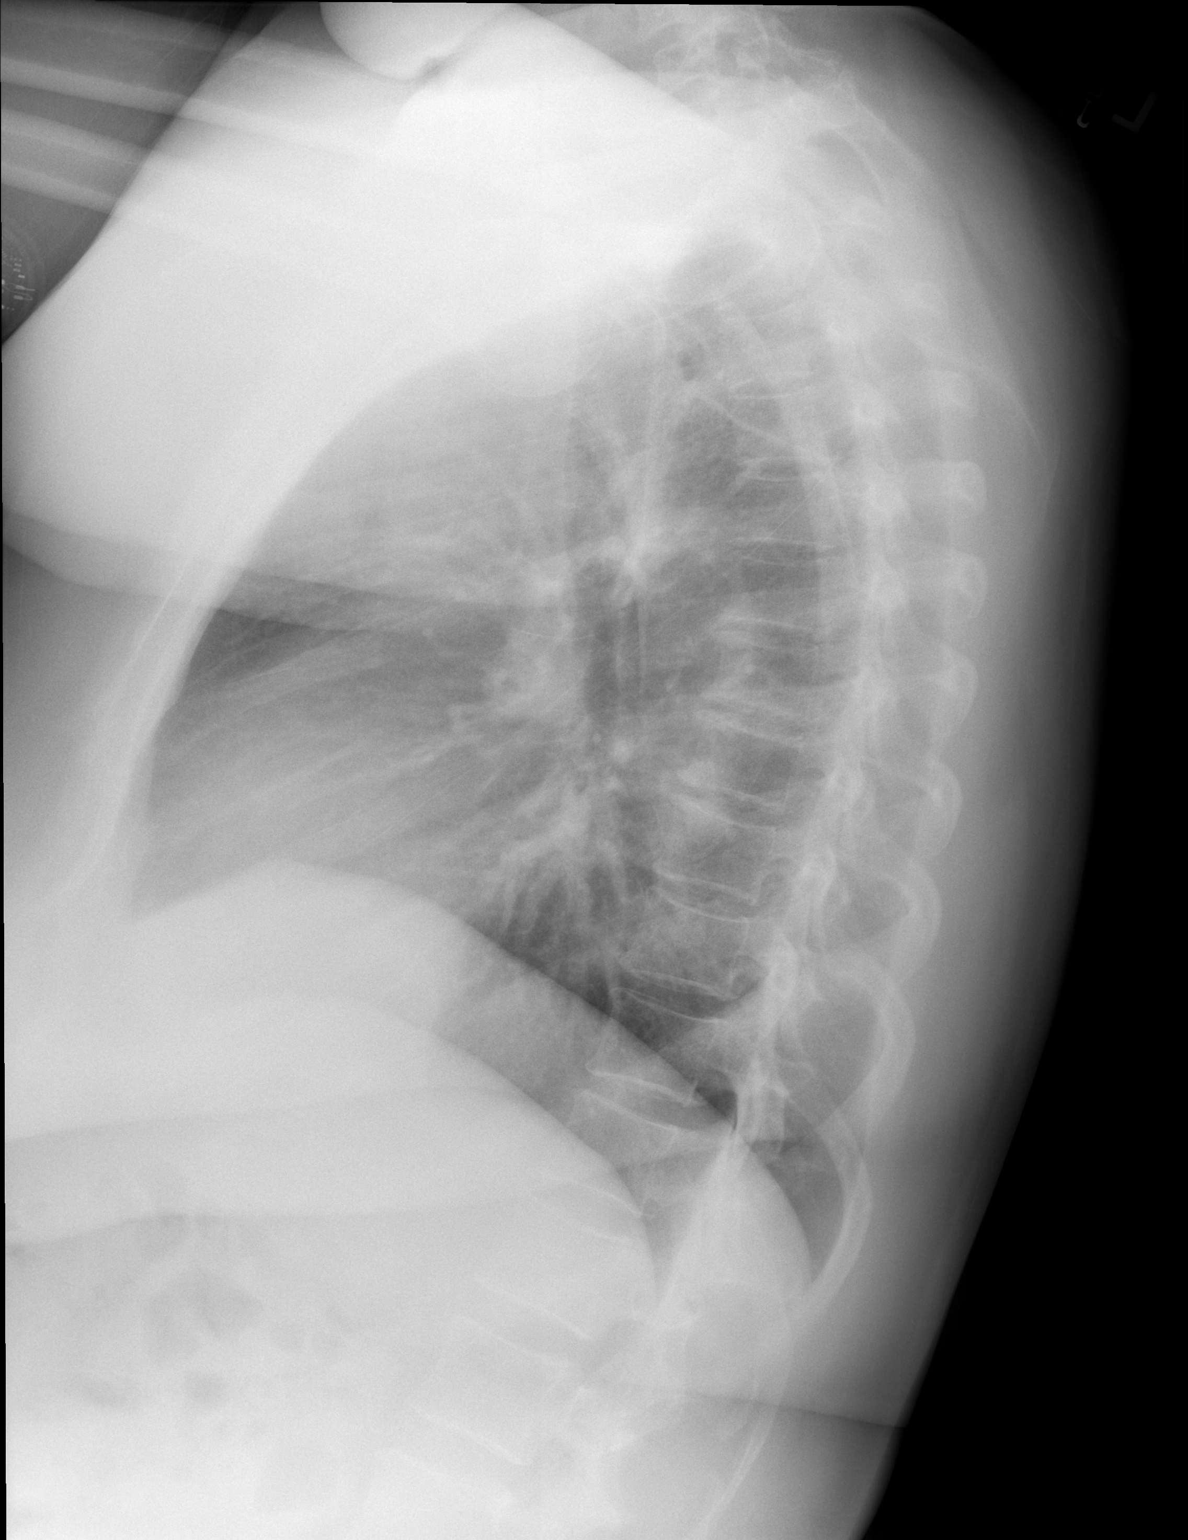

[2 of 2 positions shown; findings below may reference images not displayed]

FINDINGS: The lungs are adequately inflated and clear. The heart and pulmonary
vascularity are normal. The mediastinum is normal in width. The
trachea is midline. The bony thorax is unremarkable.
IMPRESSION: There is no active cardiopulmonary disease.

## 2018-04-18 ENCOUNTER — Ambulatory Visit: Payer: BLUE CROSS/BLUE SHIELD | Admitting: Endocrinology

## 2018-05-01 ENCOUNTER — Ambulatory Visit
Admission: RE | Admit: 2018-05-01 | Discharge: 2018-05-01 | Disposition: A | Discharge status: Home / Self Care | Payer: BLUE CROSS/BLUE SHIELD | Source: Ambulatory Visit | Attending: Nurse Practitioner | Admitting: Nurse Practitioner

## 2018-05-01 ENCOUNTER — Other Ambulatory Visit: Payer: Self-pay | Admitting: Nurse Practitioner

## 2018-05-01 DIAGNOSIS — M19012 Primary osteoarthritis, left shoulder: Secondary | ICD-10-CM | POA: Diagnosis not present

## 2018-05-01 DIAGNOSIS — R590 Localized enlarged lymph nodes: Secondary | ICD-10-CM | POA: Diagnosis not present

## 2018-05-01 DIAGNOSIS — M19011 Primary osteoarthritis, right shoulder: Secondary | ICD-10-CM | POA: Diagnosis not present

## 2018-05-01 DIAGNOSIS — M25511 Pain in right shoulder: Secondary | ICD-10-CM

## 2018-05-01 DIAGNOSIS — M25512 Pain in left shoulder: Secondary | ICD-10-CM | POA: Diagnosis not present

## 2018-05-01 IMAGING — DX DG SHOULDER 2+V*R*
3 series · 3 of 3 positions shown · non-contrast
Comparison: None.

CLINICAL DATA: 57-year-old female with chronic bilateral shoulder
pain greater on right with decreased range of motion. No known
injury. Initial encounter.

EXAM:
RIGHT SHOULDER - 2+ VIEW

[dg shoulder right (1 of 3)]
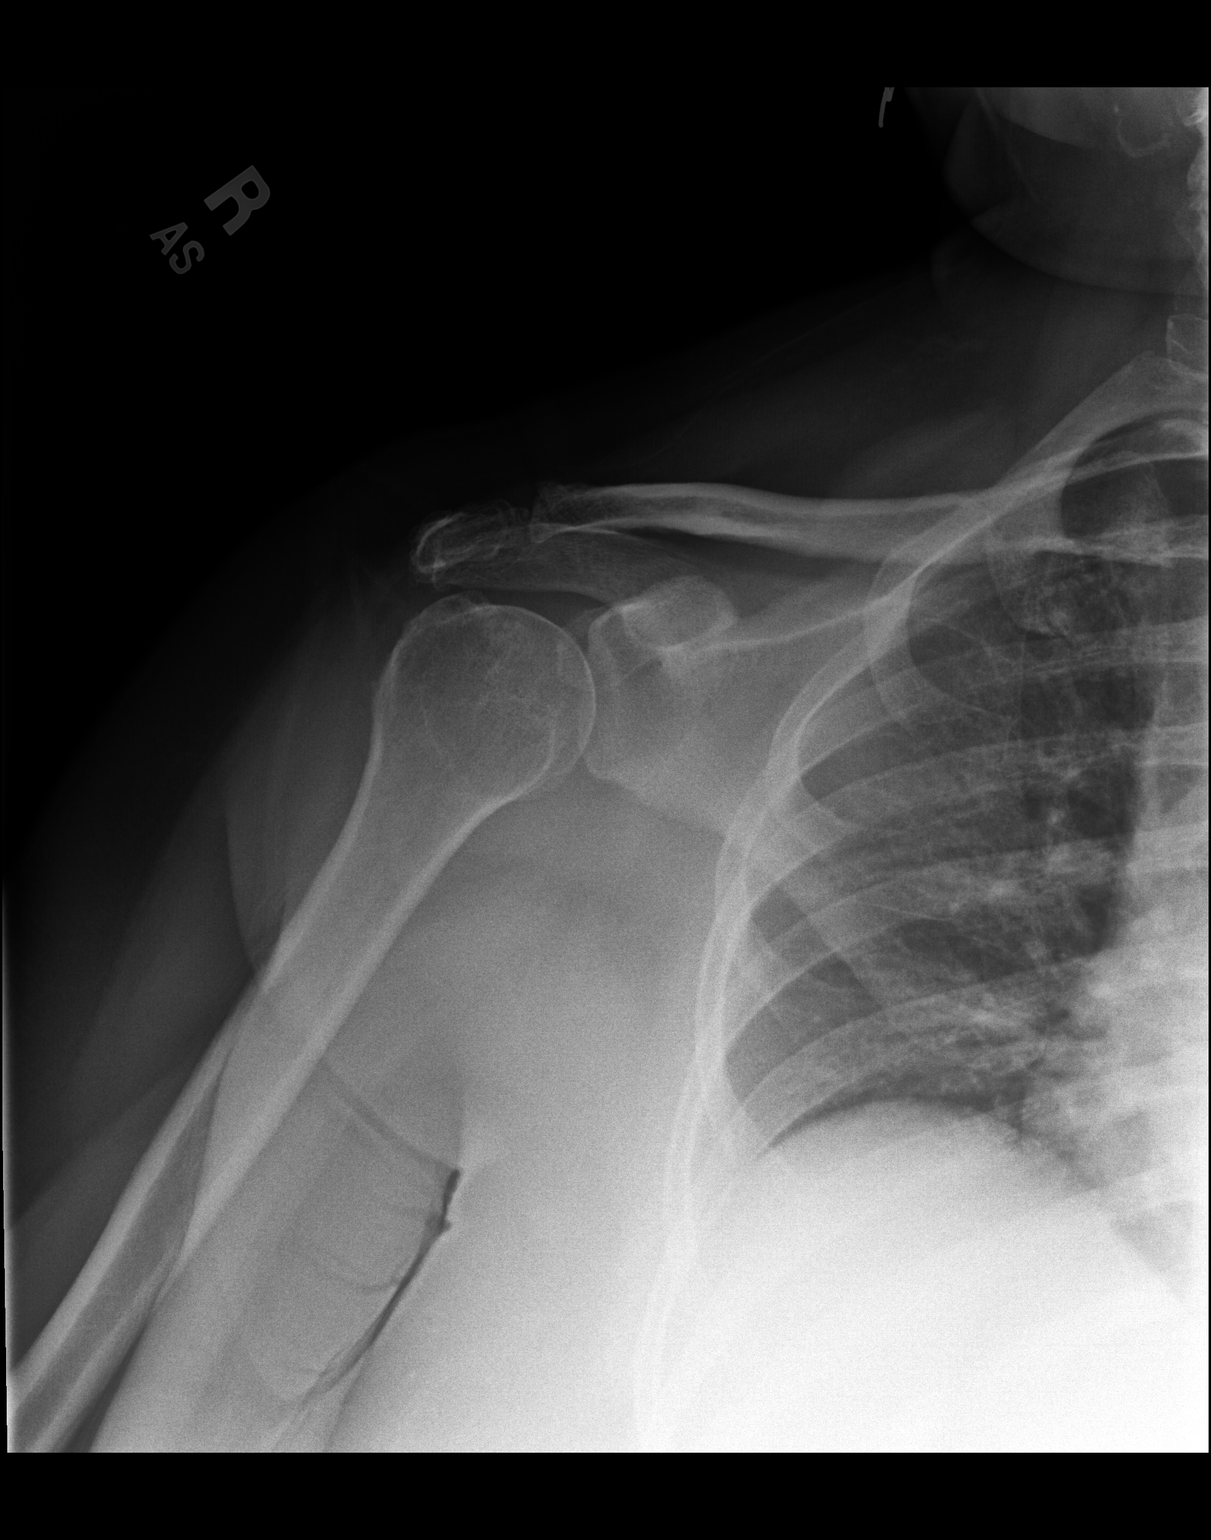

[dg shoulder right (2 of 3)]
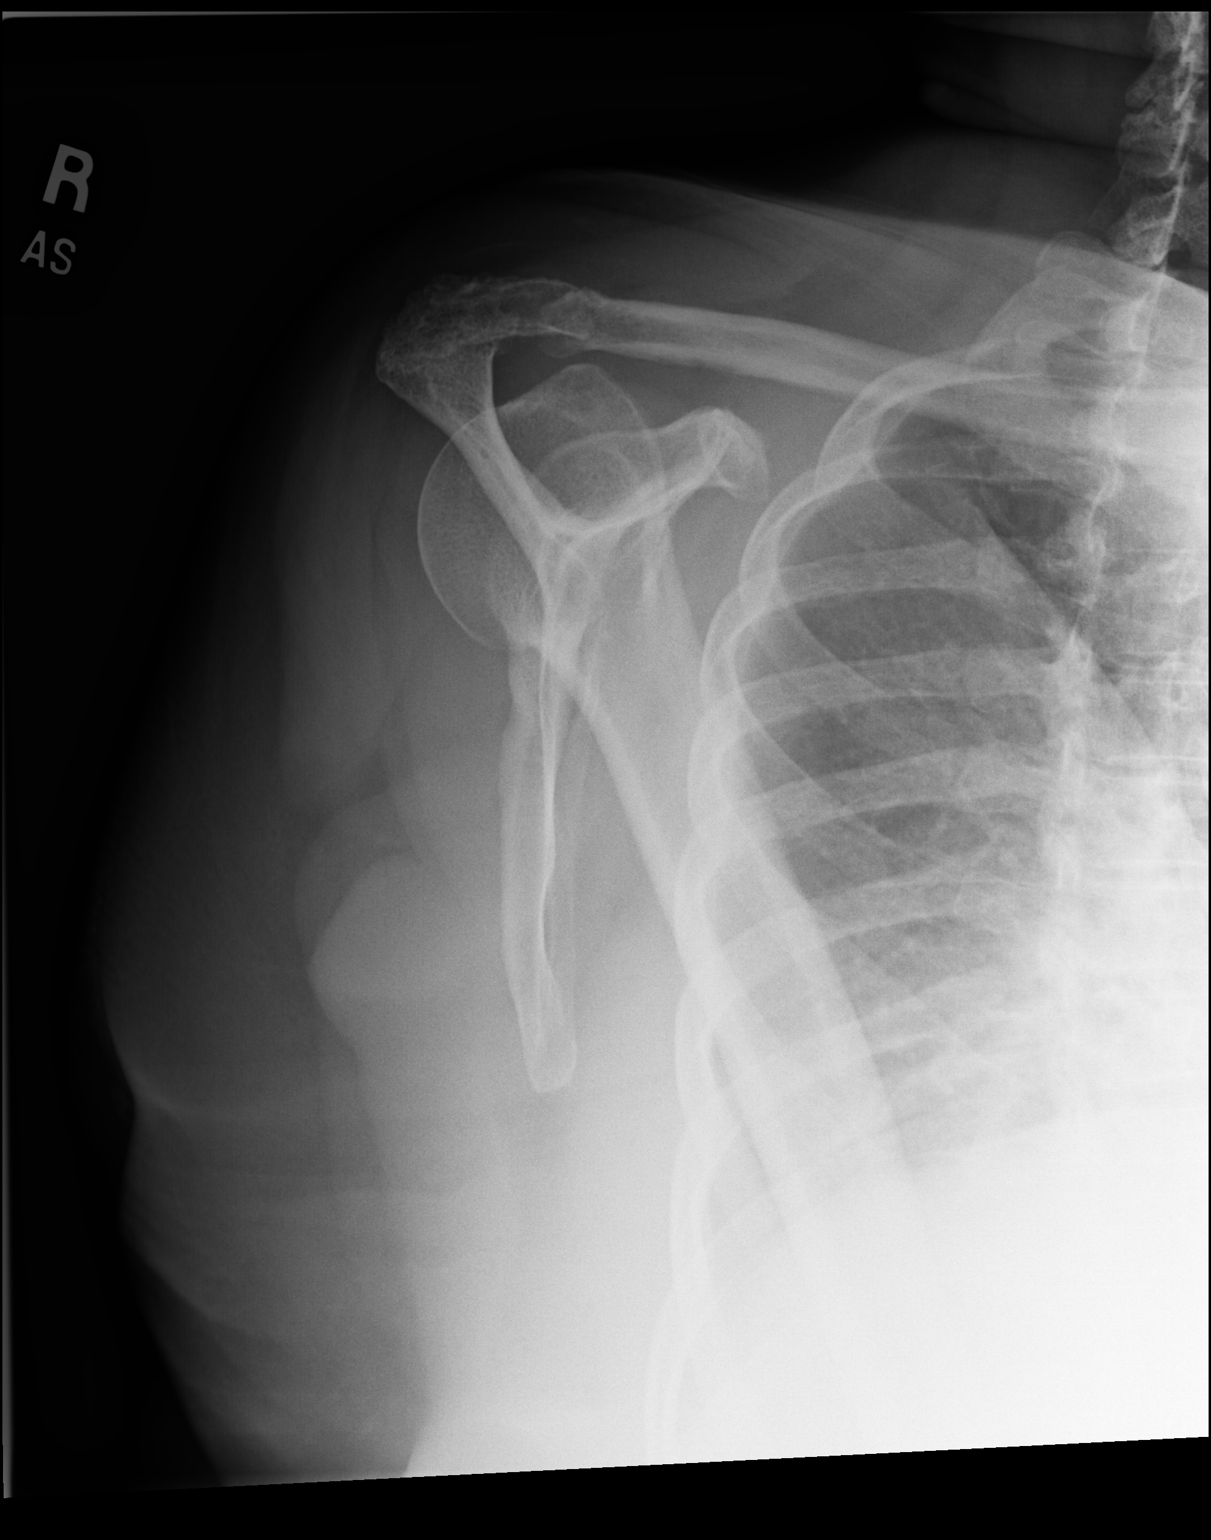

[dg shoulder right (3 of 3)]
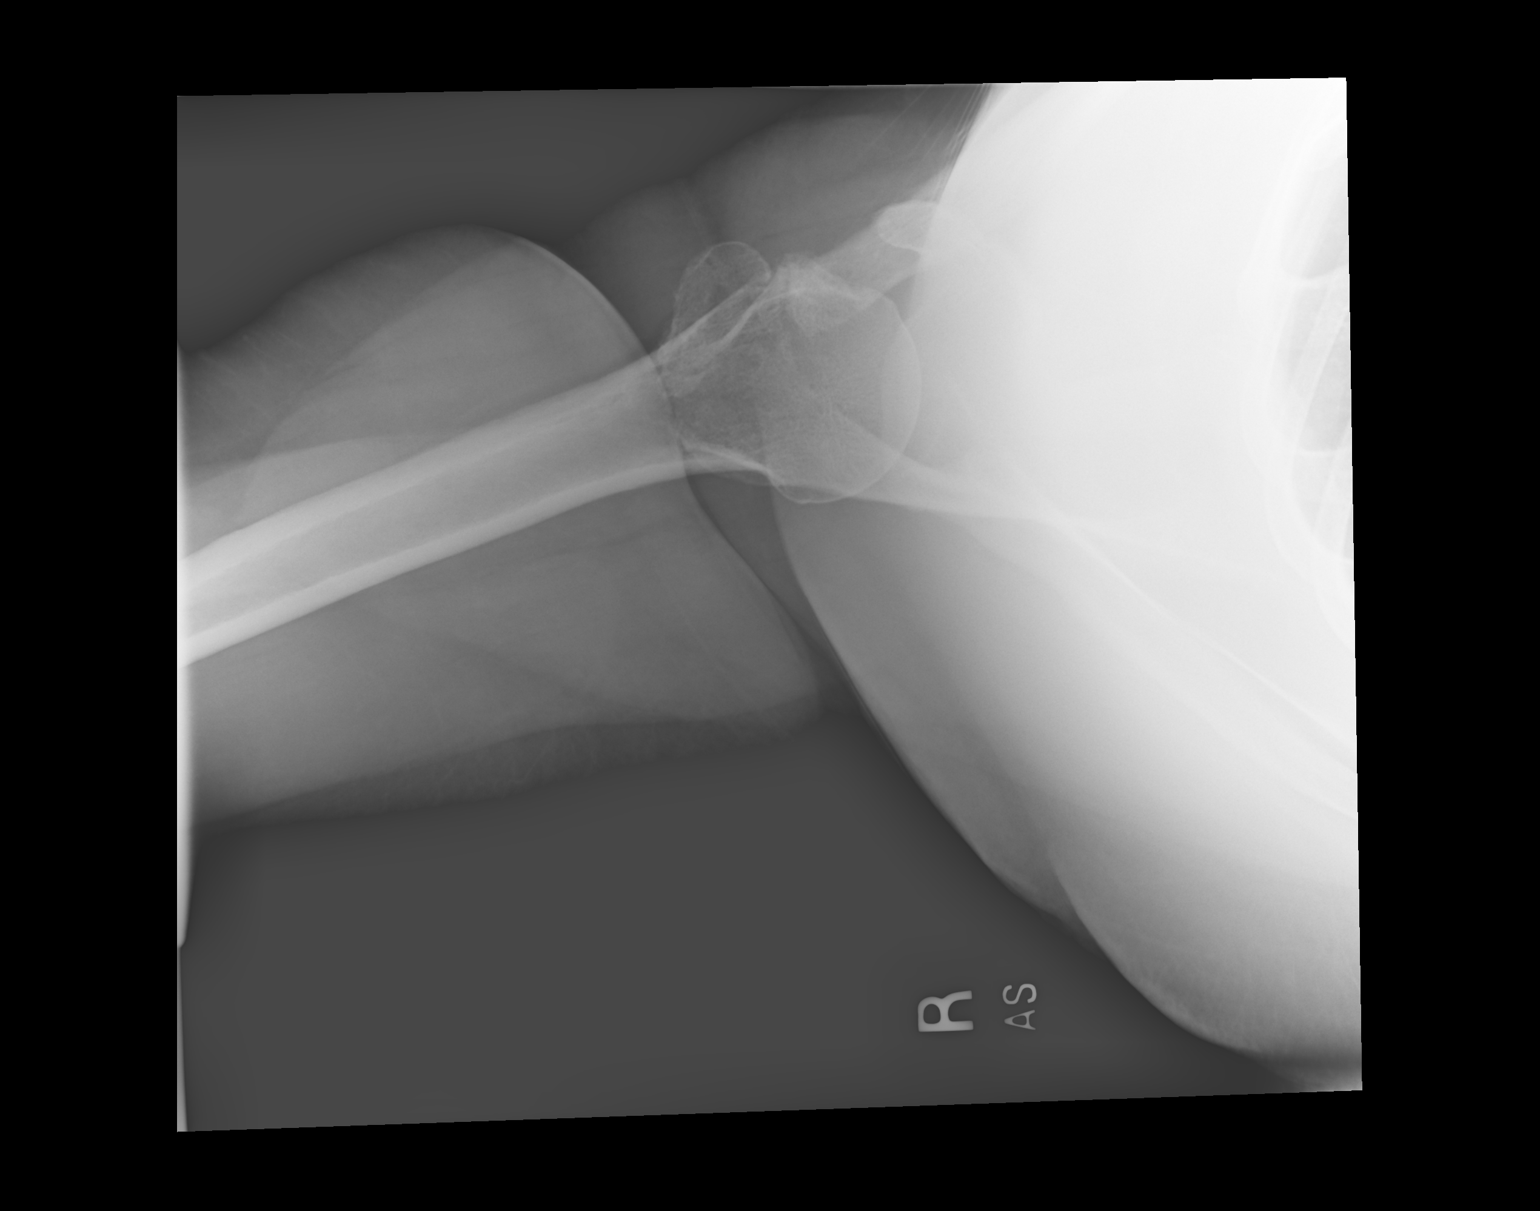

[3 of 3 positions shown; findings below may reference images not displayed]

FINDINGS: Mild acromioclavicular joint degenerative changes. Spurring
undersurface of the acromion. No fracture or dislocation. Visualized
lungs clear.
IMPRESSION: 1. Mild right acromioclavicular joint degenerative changes.
2. Spurring undersurface of the acromion.

## 2018-05-01 IMAGING — DX DG SHOULDER 2+V*L*
3 series · 3 of 3 positions shown · non-contrast
Comparison: None.

CLINICAL DATA: 57-year-old female with chronic bilateral shoulder
pain greater on right with decreased range of motion. No known
injury. Initial encounter.

EXAM:
LEFT SHOULDER - 2+ VIEW

[dg shoulder left (1 of 3)]
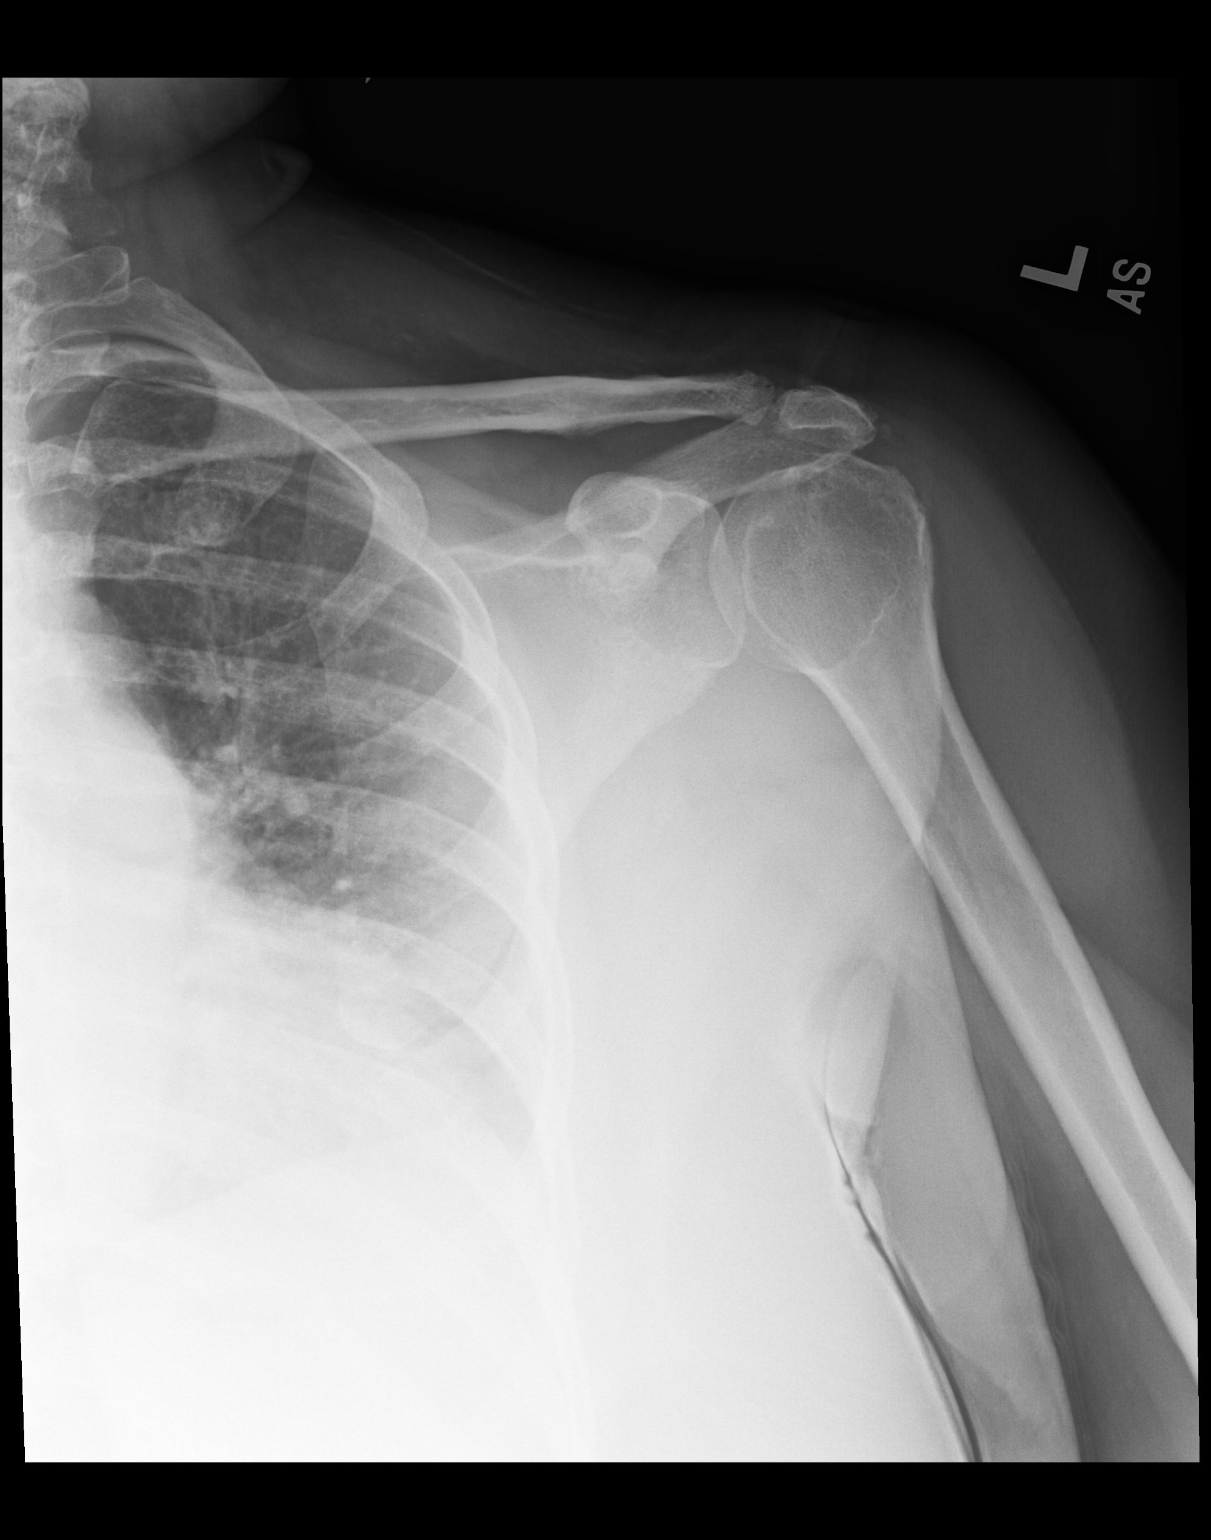

[dg shoulder left (2 of 3)]
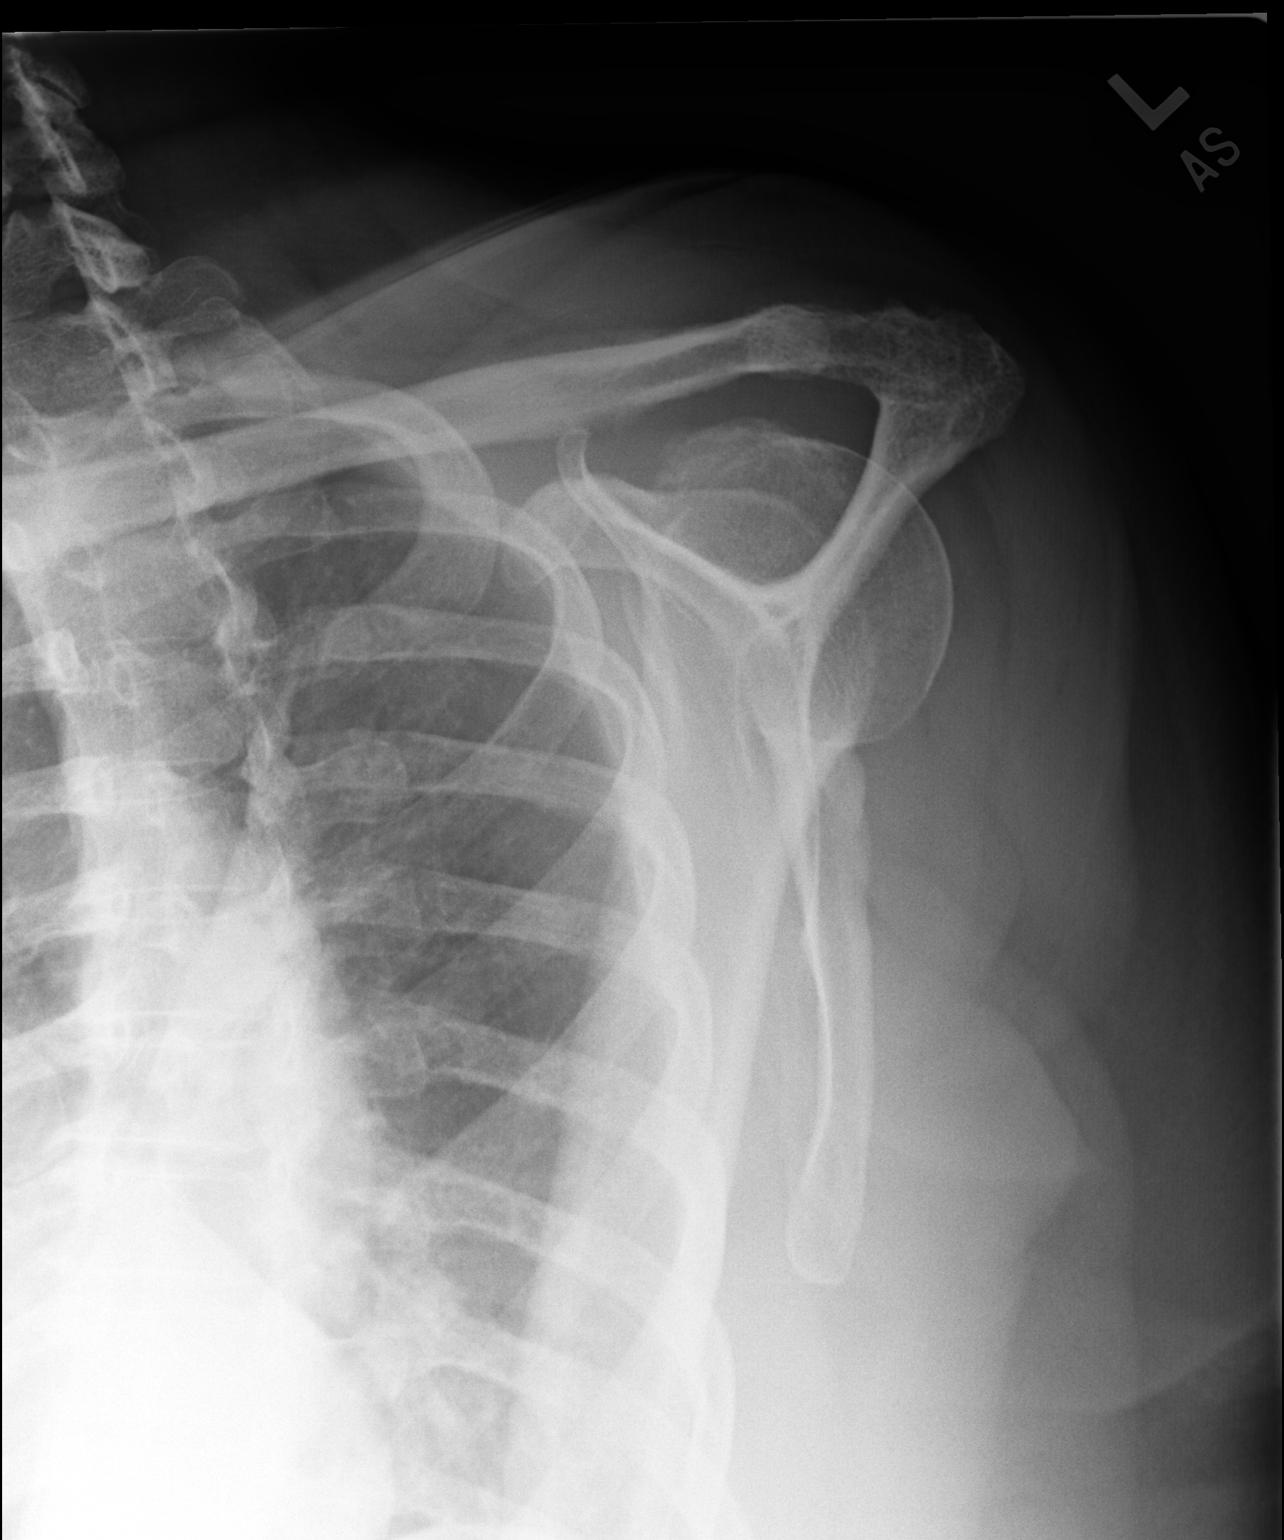

[dg shoulder left (3 of 3)]
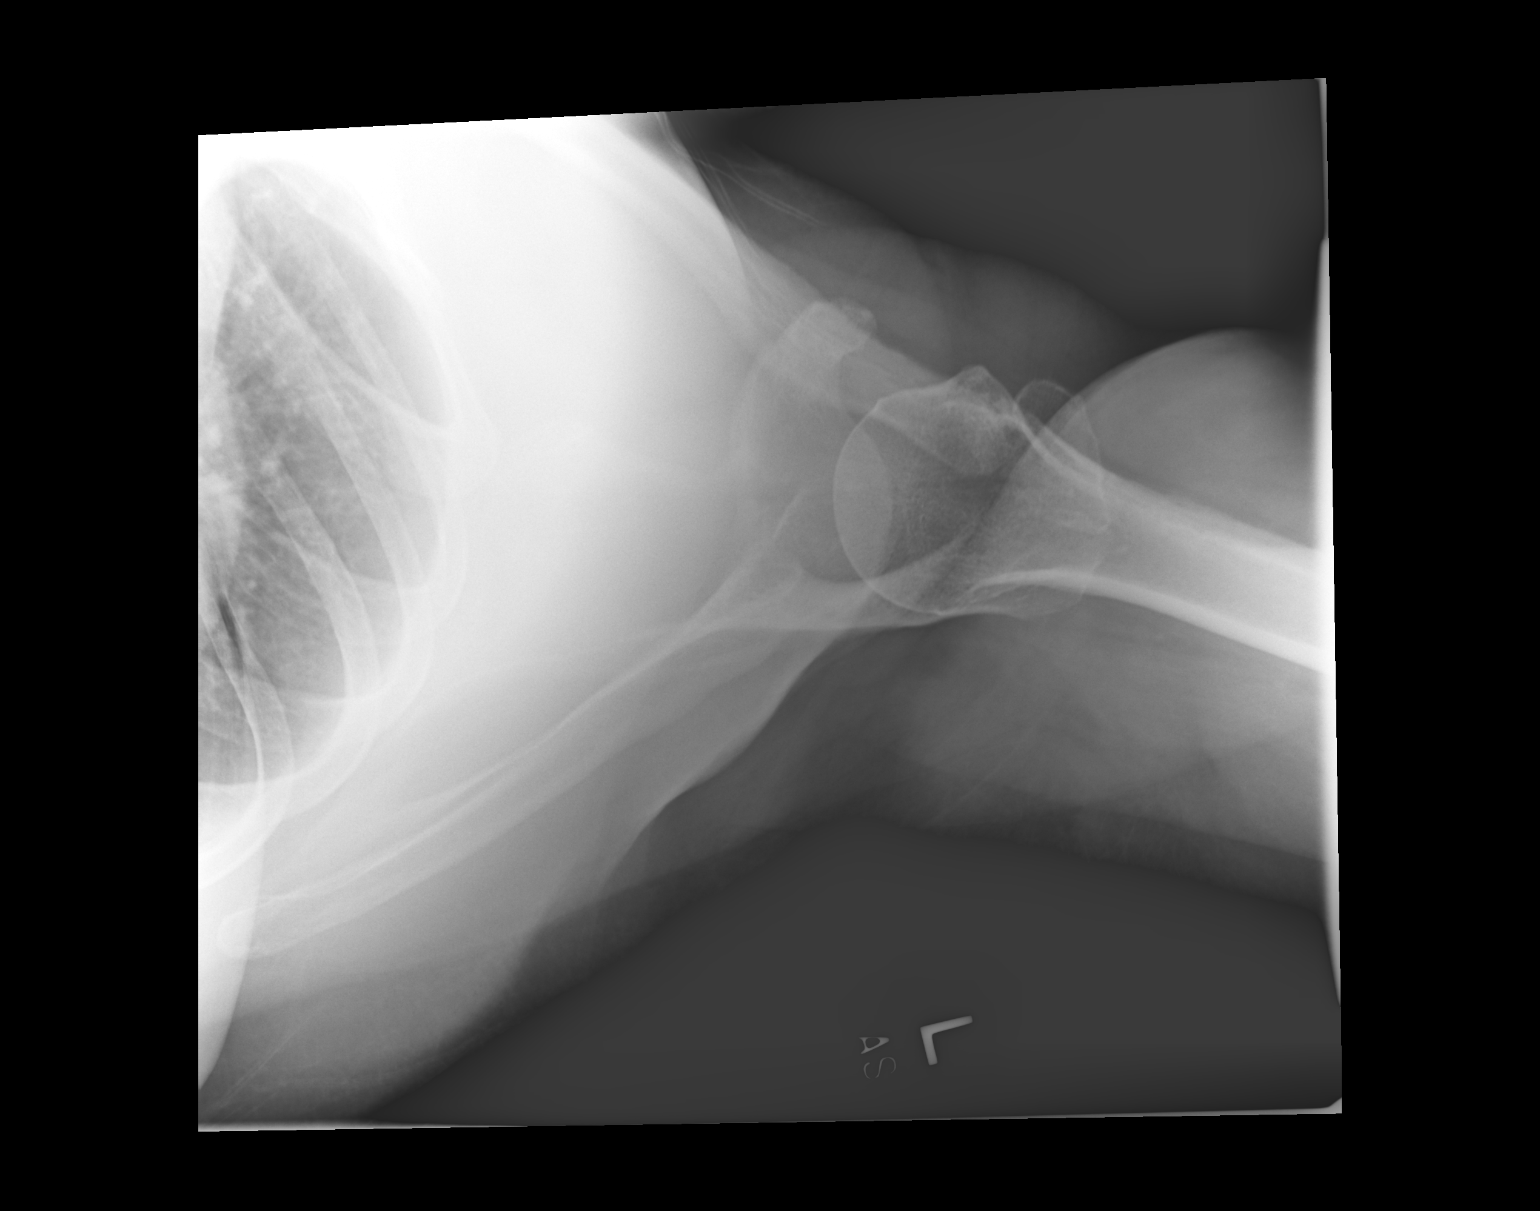

[3 of 3 positions shown; findings below may reference images not displayed]

FINDINGS: Very mild left acromioclavicular joint degenerative changes. Tiny
calcification lateral to the acromion (not along the course of the
rotator cuff). No fracture or dislocation. Visualized lungs clear.
IMPRESSION: Very mild left acromioclavicular joint degenerative changes.

## 2018-05-04 DIAGNOSIS — M25511 Pain in right shoulder: Secondary | ICD-10-CM | POA: Diagnosis not present

## 2018-05-04 DIAGNOSIS — M25512 Pain in left shoulder: Secondary | ICD-10-CM | POA: Diagnosis not present

## 2018-05-08 DIAGNOSIS — J209 Acute bronchitis, unspecified: Secondary | ICD-10-CM | POA: Diagnosis not present

## 2018-05-16 DIAGNOSIS — J45909 Unspecified asthma, uncomplicated: Secondary | ICD-10-CM | POA: Diagnosis not present

## 2018-05-16 DIAGNOSIS — E1169 Type 2 diabetes mellitus with other specified complication: Secondary | ICD-10-CM | POA: Diagnosis not present

## 2018-05-30 ENCOUNTER — Ambulatory Visit: Payer: BLUE CROSS/BLUE SHIELD | Admitting: Endocrinology

## 2018-07-19 ENCOUNTER — Other Ambulatory Visit: Payer: Self-pay

## 2018-07-19 ENCOUNTER — Encounter: Payer: Self-pay | Admitting: Endocrinology

## 2018-07-19 ENCOUNTER — Ambulatory Visit: Payer: BLUE CROSS/BLUE SHIELD | Admitting: Endocrinology

## 2018-07-19 VITALS — BP 132/84 | HR 82 | Ht 66.0 in | Wt 253.4 lb

## 2018-07-19 DIAGNOSIS — Z794 Long term (current) use of insulin: Secondary | ICD-10-CM

## 2018-07-19 DIAGNOSIS — I10 Essential (primary) hypertension: Secondary | ICD-10-CM | POA: Diagnosis not present

## 2018-07-19 DIAGNOSIS — E1165 Type 2 diabetes mellitus with hyperglycemia: Secondary | ICD-10-CM | POA: Diagnosis not present

## 2018-07-19 LAB — POCT GLYCOSYLATED HEMOGLOBIN (HGB A1C): HEMOGLOBIN A1C: 7.8 % — AB (ref 4.0–5.6)

## 2018-07-19 MED ORDER — SEMAGLUTIDE 7 MG PO TABS: 1.0000 | ORAL_TABLET | Freq: Every day | ORAL | 11 refills | Status: DC

## 2018-07-19 NOTE — Patient Instructions (Addendum)
Check blood sugars on waking up 7 days a week  Also check blood sugars about 2 hours after meals and do this after different meals by rotation  Recommended blood sugar levels on waking up are 90-130 and about 2 hours after meal is 130-160  Please bring your blood sugar monitor to each visit, thank you   

## 2018-07-19 NOTE — Progress Notes (Signed)
Patient ID: Shannon Oliver, female   DOB: 09-16-1960, 57 y.o.   MRN: [id number]          Reason for Appointment: Follow-up for Type 2 Diabetes  Referring physician: Dr. Fara Olden   History of Present Illness:          Date of diagnosis of type 2 diabetes mellitus: 2008       Background history:   She thinks she has been on mostly oral hypoglycemic drugs notably metformin and Amaryl for most of the duration of her diabetes She was probably started on insulin in 2018 with Levemir insulin when her A1c was 9.6 She has not had an endocrinology follow-up since 03/2017  Recent history:   INSULIN regimen is:  Novolin 70/30, 15 units at times  Non-insulin hypoglycemic drugs the patient is taking are: Metformin ER 500 mg bid, Amaryl 2 mg in a.m.  She has not been seen in follow-up since 9/19  Patient works third shift; meals 2 am, 12 Noon, 7 pm  A1c is now 7.8, previously 10.5  Current management, blood sugar patterns and problems identified:  She did not follow-up after her visit in 9/19  Although she was advised to take insulin before breakfast and dinnertime she says because of tendency to low sugars she only takes it when her blood sugar goes up  However she is checking her blood sugar very infrequently  Her PCP has given her the freestyle libre sensor which she has used better monitoring with this has been extremely irregular with data available only for the 13% of the last 2 weeks  She has been repeatedly advised to see nutritionist and diabetes educator.  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  Sugars on the previous visit had been well over 200 and frequently in the 300 range  Her monitor indicates she has postprandial hyperglycemia at lunch, after her meals at work during the night and occasionally in the evening at dinnertime but not clear if  she is having fasting hyperglycemia  No hypoglycemia documented  Walking: at work       Side effects from medications have been: Dizziness and sharp side pains from Invokana  Compliance with the medical regimen: Poor Hypoglycemia: Never  Glucose monitoring:  done <1 times a day         Glucometer:  Accu-Chek brand     Blood Glucose readings by download of freestyle [employer] available only 13% of the time Overall average 206 Insufficient data available to get blood sugar patterns   Self-care: The diet that the patient has been following is: tries to limit sugar drinks.     Meal times are: Usually 2 meals a day, variable times  Typical meal intake: Breakfast is eggs and toast              Dietician visit, most recent: Never  Weight history:  Wt Readings from Last 3 Encounters:  07/19/18 253 lb 6.4 oz (114.9 kg)  03/13/18 251 lb (113.9 kg)  11/16/17 253 lb 6.4 oz (114.9 kg)    Glycemic control:   Lab Results  Component Value Date   HGBA1C 7.8 (A) 07/19/2018   HGBA1C 10.5 (H) 03/08/2018   HGBA1C 10.1 03/30/2017   Lab Results  Component Value Date   LDLCALC 82 04/13/2018  CREATININE 0.85 04/13/2018   No results found for: [facility]  Lab Results  Component Value Date   FRUCTOSAMINE 394 (H) 04/13/2018   FRUCTOSAMINE 361 (H) 11/14/2017   FRUCTOSAMINE 336 (H) 10/25/2016      Allergies as of 07/19/2018      Reactions   Hydrocodone Hypertension      Medication List       Accurate as of July 19, 2018  8:46 PM. Always use your most recent med list.        acetaminophen 500 MG tablet Commonly known as:  TYLENOL Take 500-1,000 mg by mouth every 6 (six) hours as needed for mild pain.   amLODipine 10 MG tablet Commonly known as:  NORVASC TAKE 1 TABLET EVERY DAY ONCE A DAY ORALLY 90   escitalopram 10 MG tablet Commonly known as:  LEXAPRO   FREESTYLE LIBRE 14 DAY SENSOR Misc 1 Units by Does not apply route every 14 (fourteen) days.     glimepiride 2 MG tablet Commonly known as:  AMARYL Take 2 mg by mouth daily before breakfast.   glucose blood test strip Commonly known as:  ACCU-CHEK GUIDE Use as instructed to check blood sugar one time daily.   Insulin Syringe-Needle U-100 30G X 1/2" 0.3 ML Misc Use twice a day with insulin   losartan-hydrochlorothiazide 100-25 MG tablet Commonly known as:  HYZAAR Take 1 tablet by mouth every morning.   MELATONIN PO Take by mouth.   metFORMIN 500 MG 24 hr tablet Commonly known as:  GLUCOPHAGE-XR 500 mg 2 (two) times daily. TAKE 1 TABLET (500MG ) BY MOUTH TWICE DAILY.   metoprolol succinate 100 MG 24 hr tablet Commonly known as:  TOPROL-XL   NOVOLOG MIX 70/30 FLEXPEN (70-30) 100 UNIT/ML FlexPen Generic drug:  insulin aspart protamine - aspart INJECT 20 UNITS INTO THE SKIN TWICE A DAY   ONE-A-DAY WOMENS PO Take 1 tablet by mouth daily.   Semaglutide 7 MG Tabs Commonly known as:  RYBELSUS Take 1 tablet by mouth daily. TAKE 1 (7MG ) TABLET BY MOUTH ONCE DAILY.   simvastatin 20 MG tablet Commonly known as:  ZOCOR TAKE 1 TABLET BY MOUTH EVERY EVENING AT BEDTIME       Allergies:  Allergies  Allergen Reactions  . Hydrocodone Hypertension    Past Medical History:  Diagnosis Date  . Abnormal EKG    LVH with strain  . Acid reflux   . Depression   . Diabetes mellitus    A1C over 9  . HLD (hyperlipidemia)   . Hypertension   . Noncompliance   . Obesity     Past Surgical History:  Procedure Laterality Date  . COLONOSCOPY WITH PROPOFOL N/A 04/29/2015   Procedure: COLONOSCOPY WITH PROPOFOL;  Surgeon: Garlan Fair, MD;  Location: [facility];  Service: Endoscopy;  Laterality: N/A;  . HYSTEROSCOPY W/D&C N/A 09/07/2017   Procedure: DILATATION AND CURETTAGE /HYSTEROSCOPY;  Surgeon: Sloan Leiter, MD;  Location: [place];  Service: Gynecology;  Laterality: N/A;  . KNEE ARTHROSCOPY W/ MENISCAL REPAIR Right     Family History  Problem Relation  Age of Onset  . Diabetes Neg Hx     Social History:  reports that she has never smoked. She has never used smokeless tobacco. She reports that she does not drink alcohol or use drugs.   Review of Systems   Lipid history:  Treated with Zocor 20 mg daily Her last LDL was below 100    Lab Results  Component Value  Date   CHOL 156 04/13/2018   HDL 46.30 04/13/2018   LDLCALC 82 04/13/2018   TRIG 139.0 04/13/2018   CHOLHDL 3 04/13/2018           Hypertension: She is on losartan HCT, metoprolol and Norvasc from PCP Blood pressure relatively better today Overall has been inconsistent with her treatment regimen in the past  BP Readings from Last 3 Encounters:  07/19/18 132/84  03/13/18 (!) 150/108  11/16/17 140/80    Hypokalemia: Followed by PCP, no recent labs available  Lab Results  Component Value Date   K 3.4 (L) 04/13/2018     Most recent eye exam was in 2017  Most recent foot exam: 10/2017   LABS:  Office Visit on 07/19/2018  Component Date Value Ref Range Status  . Hemoglobin A1C 07/19/2018 7.8* 4.0 - 5.6 % Final    Physical Examination:  BP 132/84 (BP Location: Left Arm, Patient Position: Sitting, Cuff Size: Normal)   Pulse 82   Ht 5\' 6"  (1.676 m)   Wt 253 lb 6.4 oz (114.9 kg)   LMP 08/02/2013 Comment: spotting  SpO2 96%   BMI 40.90 kg/m         ASSESSMENT:  Diabetes type 2, uncontrolled  See history of present illness for detailed discussion of current diabetes management, blood sugar patterns and problems identified  Her A1c is 7.8 which is better than her usual reading of over 10%  Although she has taken insulin since she was last seen in 9/19 she is not understanding how insulin works, time course of action of premixed insulin and timing of taking the insulin Her blood sugar monitoring is very sporadic even with using the CGM She only takes her insulin when her blood sugar goes up but if this is postprandial and she may have low sugar  tendency later on  She does not have any meal plan and has not seen the dietitian Again having difficulty losing weight   HYPERTENSION: This is recently somewhat better with improved compliance with her medications   PLAN:    Since she is not highly insulin resistant and needs to lose weight she may do better with a GLP-1 drug  She had some nonspecific side effects from [place] and does not want to try this  Discussed with the patient the nature of GLP-1 drugs, the actions on various organ systems, how they benefit blood glucose control, as well as the benefit of weight loss and  increase satiety .  Explained possible side effects especially nausea and vomiting initially; discussed safety information  For convenience she can take Rybelsus before her first meal on waking up  Explained to her how this is taken with water only and the need to titrate this after 30 days to the 7 mg dose, 30-day sample given of 3 mg  She was given the co-pay card and activation of this promotional program given  She will be sent to 7 mg prescription  She can hold off on insulin for now and continue Amaryl and metformin  Discussed need to check her sugars 3-4 times a day and since she wants to continue the CGM discussed importance of using this consistently  She will see the dietitian for meal planning  Needs more regular follow-up    Patient Instructions  Check blood sugars on waking up 7 days a week  Also check blood sugars about 2 hours after meals and do this after different meals by rotation  Recommended blood sugar  levels on waking up are 90-130 and about 2 hours after meal is 130-160  Please bring your blood sugar monitor to each visit, thank you               Elayne Snare 07/19/2018, 8:46 PM   Note: This office note was prepared with Dragon voice recognition system technology. Any transcriptional errors that result from this process are unintentional.

## 2018-07-25 ENCOUNTER — Encounter: Payer: Self-pay | Admitting: Dietician

## 2018-07-25 ENCOUNTER — Encounter: Payer: BLUE CROSS/BLUE SHIELD | Attending: Internal Medicine | Admitting: Dietician

## 2018-07-25 DIAGNOSIS — E1142 Type 2 diabetes mellitus with diabetic polyneuropathy: Secondary | ICD-10-CM

## 2018-07-25 NOTE — Progress Notes (Signed)
Diabetes Self-Management Education  Visit Type: First/Initial  Appt. Start Time: 0810 Appt. End Time: 0940  07/25/2018  [name], identified by name and date of birth, is a 58 y.o. female with a diagnosis of Diabetes: Type 2.   ASSESSMENT History includes type 2 Diabetes since 2008, HLD, HTN, depression, GERD. Labs noted and include A1C of 7.8% 07/19/2018 decreased from 10.5% 03/2018.  She states that she did this by reducing her Coke intake to 1 daily and also taking her medication.  Medications include:  Rybelsus, Amaryl, and Metformin.  She was told that she could hold on the Novolin for now.  She does not like needles.  She has the [employer] but has not been swiping this every 8 hours and has a lot of gaps and little noted sensor readings.    She only can sleep 4 hours at a time and then wakes.  She used to work 3rd shift and has never been able to sleep well.  Her husband watches TV in the bedroom while she sleeps.  Her BG was better controlled this January compared to September due to September due to taking her medication more.  She states that she has had a very bad attitude about both the diabetes and taking medication.  Patient lives with her husband and son.  Her husband has diabetes.  Jersee does most of the shopping and they cook individually. 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  Mostly first and second shift but occasional 3rd shift.  Height 5\' 6"  (1.676 m), weight 253 lb (114.8 kg), last menstrual period 08/02/2013. Body mass index is 40.84 kg/m.  Diabetes Self-Management Education - 07/25/18 0826      Visit Information   Visit Type  First/Initial      Initial Visit   Diabetes Type  Type 2    Are you currently following a meal plan?  No    Are you taking your medications as prescribed?  Yes    Date Diagnosed  2008      Health Coping   How would you rate your overall health?  Fair      Psychosocial Assessment   Patient Belief/Attitude about Diabetes  Other (comment)   frustrated   Self-care barriers  Other (comment)   depression and frustration about diabetes   Self-management support  Doctor's office    Other persons present  Patient    Patient Concerns  Nutrition/Meal planning;Glycemic Control;Weight Control    Special Needs  None    Preferred Learning Style  No preference indicated    Learning Readiness  Ready    How often do you need to have someone help you when you read instructions, pamphlets, or other written materials from your doctor or pharmacy?  1 - Never    What is the last grade level you completed in school?  some college      Pre-Education Assessment   Patient understands the diabetes disease and treatment process.  Needs Instruction    Patient understands incorporating nutritional management into lifestyle.  Needs Instruction    Patient undertands incorporating physical activity into lifestyle.  Needs Instruction    Patient understands using medications safely.  Needs Instruction    Patient understands monitoring blood glucose, interpreting and using results  Needs Instruction    Patient understands prevention, detection, and treatment of acute complications.  Needs Instruction    Patient understands prevention,  detection, and treatment of chronic complications.  Needs Instruction    Patient understands how to develop strategies to address psychosocial issues.  Needs Instruction    Patient understands how to develop strategies to promote health/change behavior.  Needs Instruction      Complications   Last HgB A1C per patient/outside source  7.8 %   07/19/2018 decreased from 10.5 03/08/18   How often do you check your blood sugar?  3-4 times/day   FreeStyle Libre   Fasting Blood glucose range (mg/dL)  70-129    Number of hypoglycemic episodes per month  3    Can you tell when your blood sugar is low?  Yes    What do you do if your blood sugar is low?   eats crackers or candy or something else    Number of hyperglycemic episodes per week  2    Can you tell when your blood sugar is high?  Yes    What do you do if your blood sugar is high?  remembers to take her medication    Have you had a dilated eye exam in the past 12 months?  No    Have you had a dental exam in the past 12 months?  No   No dental insurance   Are you checking your feet?  Yes    How many days per week are you checking your feet?  7      Dietary Intake   Breakfast  occasional instant oatmeal mixed with a little regular oats   10   Lunch  bacon, eggs, grits    12-1   Snack (afternoon)  chips or cheese popcorn    Dinner  meat, pasta or rice, vegetables    Snack (evening)  leftovers occasionally   3 am   Beverage(s)  water, regular coke (1 bottle per day), diluted apple juice, 3 shots vodka mixed with cranberry juice 2 times per week      Exercise   Exercise Type  Light (walking / raking leaves)   walks a lot with her jobs     Patient Education   Previous Diabetes Education  No    Disease state   Definition of diabetes, type 1 and 2, and the diagnosis of diabetes    Nutrition management   Food label reading, portion sizes and measuring food.;Meal options for control of blood glucose level and chronic complications.;Role of diet in the treatment of diabetes and the relationship between the three main macronutrients and blood glucose level;Effects of alcohol on blood glucose and safety factors with consumption of alcohol.    Physical activity and exercise   Role of exercise on diabetes management, blood pressure control and cardiac health.    Medications  Reviewed patients medication for diabetes, action, purpose, timing of dose and side effects.    Monitoring  Purpose and frequency of SMBG.;Identified appropriate SMBG and/or A1C goals.;Daily foot exams;Yearly dilated eye exam    Acute complications  Taught treatment of hypoglycemia - the 15 rule.;Discussed and identified  patients' treatment of hyperglycemia.    Chronic complications  Assessed and discussed foot care and prevention of foot problems;Dental care;Relationship between chronic complications and blood glucose control    Psychosocial adjustment  Worked with patient to identify barriers to care and solutions;Role of stress on diabetes;Identified and addressed patients feelings and concerns about diabetes    Personal strategies to promote health  Lifestyle issues that need to be addressed for better diabetes care  Individualized Goals (developed by patient)   Nutrition  General guidelines for healthy choices and portions discussed    Physical Activity  Exercise 3-5 times per week;15 minutes per day    Medications  take my medication as prescribed    Monitoring   test my blood glucose as discussed    Problem Solving  regularly scheduled meals    Reducing Risk  get labs drawn;increase portions of healthy fats    Health Coping  discuss diabetes with (comment)   MD, RD, CDE     Post-Education Assessment   Patient understands the diabetes disease and treatment process.  Demonstrates understanding / competency    Patient understands incorporating nutritional management into lifestyle.  Needs Review    Patient undertands incorporating physical activity into lifestyle.  Demonstrates understanding / competency    Patient understands using medications safely.  Demonstrates understanding / competency    Patient understands monitoring blood glucose, interpreting and using results  Demonstrates understanding / competency    Patient understands prevention, detection, and treatment of acute complications.  Demonstrates understanding / competency    Patient understands prevention, detection, and treatment of chronic complications.  Demonstrates understanding / competency    Patient understands how to develop strategies to address psychosocial issues.  Needs Review    Patient understands how to develop strategies  to promote health/change behavior.  Needs Review      Outcomes   Expected Outcomes  Demonstrated interest in learning. Expect positive outcomes    Future DMSE  PRN    Program Status  Completed       Individualized Plan for Diabetes Self-Management Training:   Learning Objective:  Patient will have a greater understanding of diabetes self-management. Patient education plan is to attend individual and/or group sessions per assessed needs and concerns.   Plan:   Patient Instructions  Be sure to swipe the Libre at least every 8 hours.  Look for "Freestyle Libre Link" (Yellow) Calorie [employer] job on the changes made!  Changing your beverages- continue to be mindful of this   Consider using lime, lemon, or orange slices in your water rather than juice.  Continue to increase your vegetable intake  Adding fruit to your diet  Decreasing your fat intake   Use the air fryer   Berniece Salines and other high fat meats less often     Small portions of lean meat or use beans instead Aim for Breakfast, lunch, and dinner daily.   Aim for 30 minutes of walking or other exercise most days. What can you do to improve sleep?    Expected Outcomes:  Demonstrated interest in learning. Expect positive outcomes, breakfast ideas  Education material provided: ADA Diabetes: Your Take Control Guide, Food label handouts, Meal plan card and Snack sheet  If problems or questions, patient to contact team via:  Phone  Future DSME appointment: PRN

## 2018-07-25 NOTE — Patient Instructions (Addendum)
Be sure to [place] at least every 8 hours.  Look for "Freestyle Libre Link" (Yellow) Calorie [employer] job on the changes made!  Changing your beverages- continue to be mindful of this   Consider using lime, lemon, or orange slices in your water rather than juice.  Continue to increase your vegetable intake  Adding fruit to your diet  Decreasing your fat intake   Use the air fryer   Berniece Salines and other high fat meats less often     Small portions of lean meat or use beans instead Aim for Breakfast, lunch, and dinner daily.   Aim for 30 minutes of walking or other exercise most days. What can you do to improve sleep?

## 2018-08-09 ENCOUNTER — Emergency Department (HOSPITAL_BASED_OUTPATIENT_CLINIC_OR_DEPARTMENT_OTHER): Payer: BLUE CROSS/BLUE SHIELD

## 2018-08-09 ENCOUNTER — Encounter (HOSPITAL_BASED_OUTPATIENT_CLINIC_OR_DEPARTMENT_OTHER): Payer: Self-pay | Admitting: Emergency Medicine

## 2018-08-09 ENCOUNTER — Emergency Department (HOSPITAL_BASED_OUTPATIENT_CLINIC_OR_DEPARTMENT_OTHER)
Admission: EM | Admit: 2018-08-09 | Discharge: 2018-08-09 | Disposition: A | Discharge status: Home / Self Care | Payer: BLUE CROSS/BLUE SHIELD | Attending: Emergency Medicine | Admitting: Emergency Medicine

## 2018-08-09 ENCOUNTER — Other Ambulatory Visit: Payer: Self-pay

## 2018-08-09 DIAGNOSIS — I10 Essential (primary) hypertension: Secondary | ICD-10-CM | POA: Insufficient documentation

## 2018-08-09 DIAGNOSIS — R1011 Right upper quadrant pain: Secondary | ICD-10-CM

## 2018-08-09 DIAGNOSIS — E119 Type 2 diabetes mellitus without complications: Secondary | ICD-10-CM | POA: Diagnosis not present

## 2018-08-09 DIAGNOSIS — N200 Calculus of kidney: Secondary | ICD-10-CM | POA: Diagnosis not present

## 2018-08-09 DIAGNOSIS — D259 Leiomyoma of uterus, unspecified: Secondary | ICD-10-CM | POA: Diagnosis not present

## 2018-08-09 DIAGNOSIS — M545 Low back pain: Secondary | ICD-10-CM | POA: Diagnosis not present

## 2018-08-09 DIAGNOSIS — M549 Dorsalgia, unspecified: Secondary | ICD-10-CM | POA: Diagnosis not present

## 2018-08-09 DIAGNOSIS — R109 Unspecified abdominal pain: Secondary | ICD-10-CM | POA: Diagnosis not present

## 2018-08-09 DIAGNOSIS — Z79899 Other long term (current) drug therapy: Secondary | ICD-10-CM | POA: Diagnosis not present

## 2018-08-09 DIAGNOSIS — Z794 Long term (current) use of insulin: Secondary | ICD-10-CM | POA: Diagnosis not present

## 2018-08-09 DIAGNOSIS — K8050 Calculus of bile duct without cholangitis or cholecystitis without obstruction: Secondary | ICD-10-CM

## 2018-08-09 DIAGNOSIS — M533 Sacrococcygeal disorders, not elsewhere classified: Secondary | ICD-10-CM | POA: Diagnosis not present

## 2018-08-09 LAB — CBC WITH DIFFERENTIAL/PLATELET
Abs Immature Granulocytes: 0.01 10*3/uL (ref 0.00–0.07)
BASOS ABS: 0 10*3/uL (ref 0.0–0.1)
BASOS PCT: 1 %
EOS ABS: 0.1 10*3/uL (ref 0.0–0.5)
Eosinophils Relative: 3 %
HCT: 36.2 % (ref 36.0–46.0)
Hemoglobin: 11.7 g/dL — ABNORMAL LOW (ref 12.0–15.0)
IMMATURE GRANULOCYTES: 0 %
Lymphocytes Relative: 32 %
Lymphs Abs: 1.7 10*3/uL (ref 0.7–4.0)
MCH: 30.2 pg (ref 26.0–34.0)
MCHC: 32.3 g/dL (ref 30.0–36.0)
MCV: 93.5 fL (ref 80.0–100.0)
Monocytes Absolute: 0.5 10*3/uL (ref 0.1–1.0)
Monocytes Relative: 10 %
NEUTROS PCT: 54 %
NRBC: 0 % (ref 0.0–0.2)
Neutro Abs: 2.8 10*3/uL (ref 1.7–7.7)
PLATELETS: 223 10*3/uL (ref 150–400)
RBC: 3.87 MIL/uL (ref 3.87–5.11)
RDW: 12.7 % (ref 11.5–15.5)
WBC: 5.2 10*3/uL (ref 4.0–10.5)

## 2018-08-09 LAB — COMPREHENSIVE METABOLIC PANEL
ALBUMIN: 3.9 g/dL (ref 3.5–5.0)
ALT: 18 U/L (ref 0–44)
ANION GAP: 5 (ref 5–15)
AST: 19 U/L (ref 15–41)
Alkaline Phosphatase: 45 U/L (ref 38–126)
BUN: 12 mg/dL (ref 6–20)
CHLORIDE: 103 mmol/L (ref 98–111)
CO2: 28 mmol/L (ref 22–32)
Calcium: 9.1 mg/dL (ref 8.9–10.3)
Creatinine, Ser: 0.56 mg/dL (ref 0.44–1.00)
GFR calc Af Amer: >60 mL/min (ref >60)
Glucose, Bld: 122 mg/dL — ABNORMAL HIGH (ref 70–99)
POTASSIUM: 3 mmol/L — AB (ref 3.5–5.1)
Sodium: 136 mmol/L (ref 135–145)
Total Bilirubin: 0.5 mg/dL (ref 0.3–1.2)
Total Protein: 7.7 g/dL (ref 6.5–8.1)

## 2018-08-09 LAB — URINALYSIS, ROUTINE W REFLEX MICROSCOPIC
Bilirubin Urine: NEGATIVE
GLUCOSE, UA: NEGATIVE mg/dL
Hgb urine dipstick: NEGATIVE
Ketones, ur: NEGATIVE mg/dL
Leukocytes, UA: NEGATIVE
Nitrite: NEGATIVE
PH: 6 (ref 5.0–8.0)
PROTEIN: NEGATIVE mg/dL
SPECIFIC GRAVITY, URINE: 1.02 (ref 1.005–1.030)

## 2018-08-09 LAB — LIPASE, BLOOD: LIPASE: 35 U/L (ref 11–51)

## 2018-08-09 LAB — PREGNANCY, URINE: PREG TEST UR: NEGATIVE

## 2018-08-09 LAB — TROPONIN I: Troponin I: <0.03 ng/mL (ref <0.03)

## 2018-08-09 IMAGING — CT CT L SPINE W/O CM
4 of 7 series · 16 of 33 positions shown, 17 images · non-contrast
Comparison: Abdomen and pelvis CT obtained at the same time.

CLINICAL DATA: Right flank pain since yesterday.

EXAM:
CT LUMBAR SPINE WITHOUT CONTRAST
TECHNIQUE: Multidetector CT imaging of the lumbar spine was performed without
intravenous contrast administration. Multiplanar CT image
reconstructions were also generated.

[Series 2: axial st · axial · 0.98mm/px · z∈[-412,-127]mm · 4 of 95 slices shown, 5 images]
[im 19/95  soft-tissue]
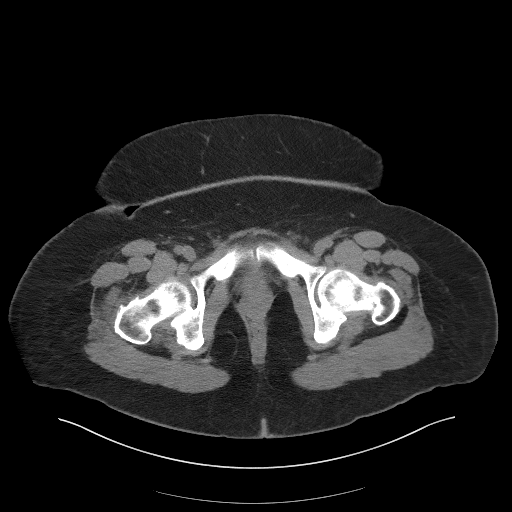
[im 19/95  bone]
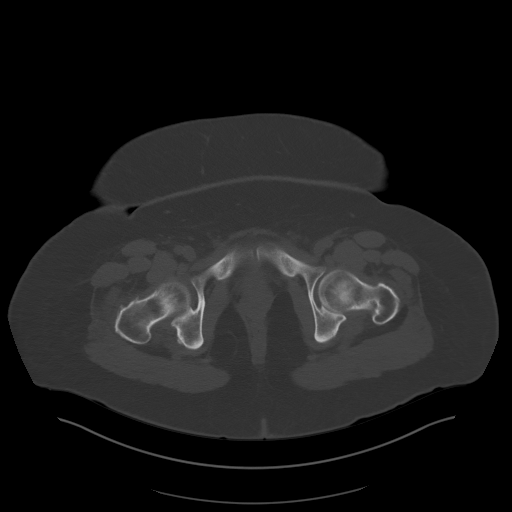
[im 38/95  bone]
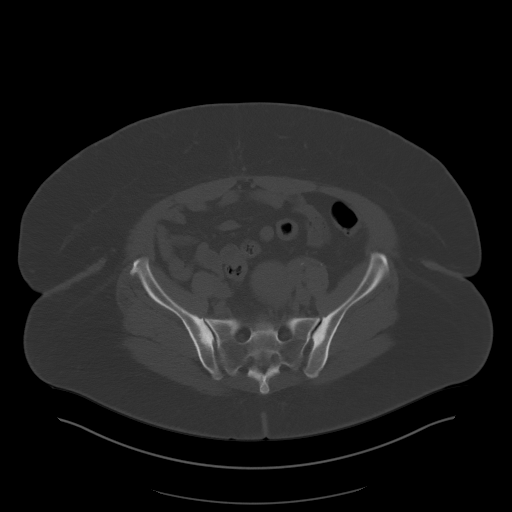
[im 57/95  bone]
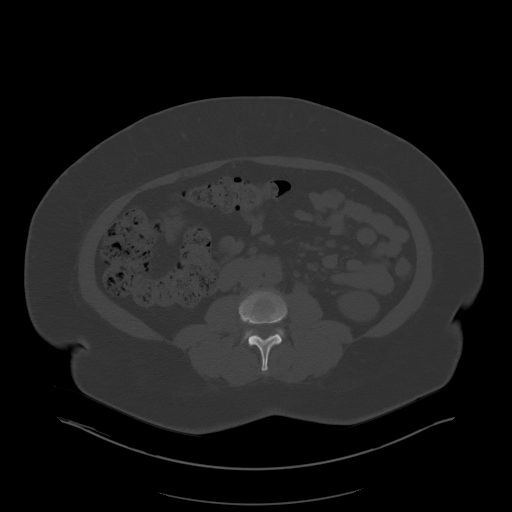
[im 76/95  bone]
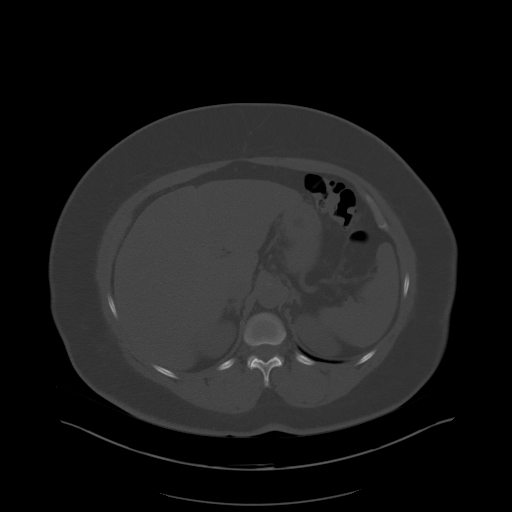

[Series 4: coronal st · coronal · 0.99mm/px · 2 of 114 slices shown]
[im 38/114  bone]
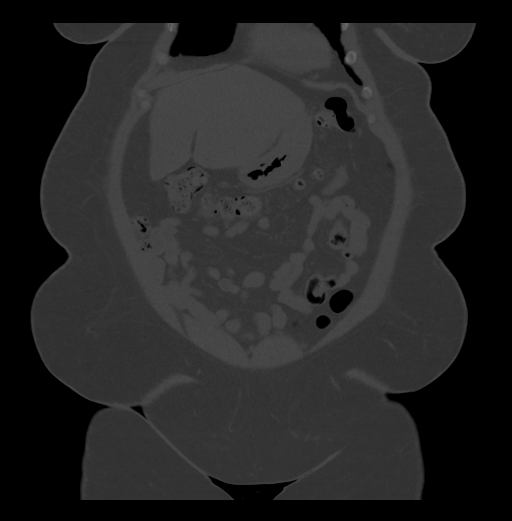
[im 76/114  bone]
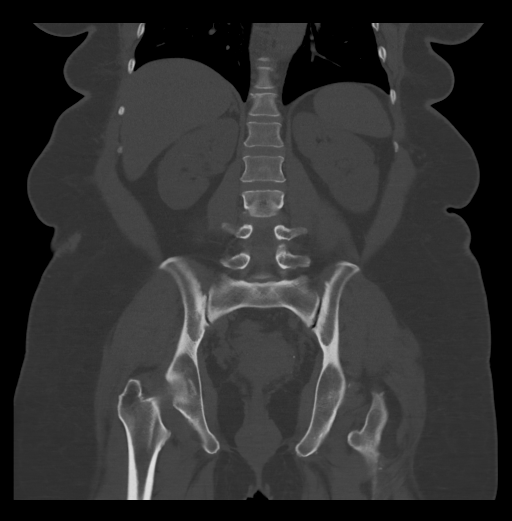

[Series 5: sagittal st · sagittal · 0.73mm/px · 5 of 153 slices shown]
[im 34/153  bone]
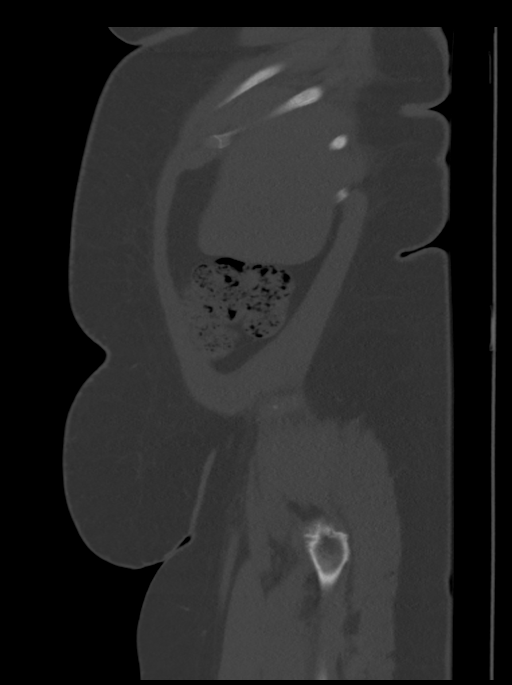
[im 51/153  bone]
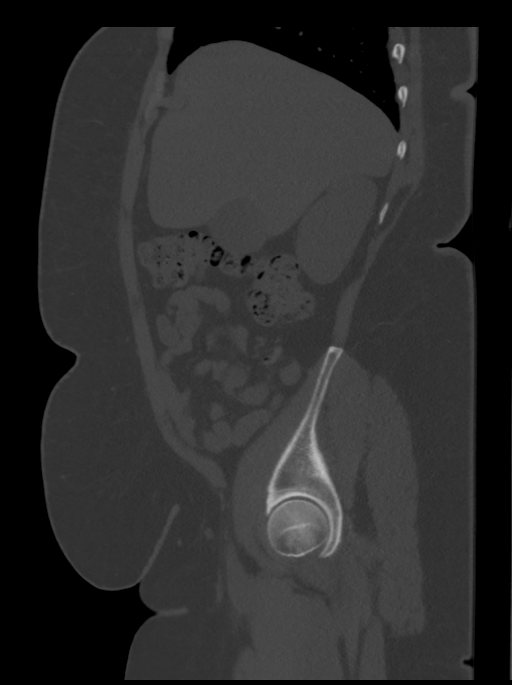
[im 68/153  bone]
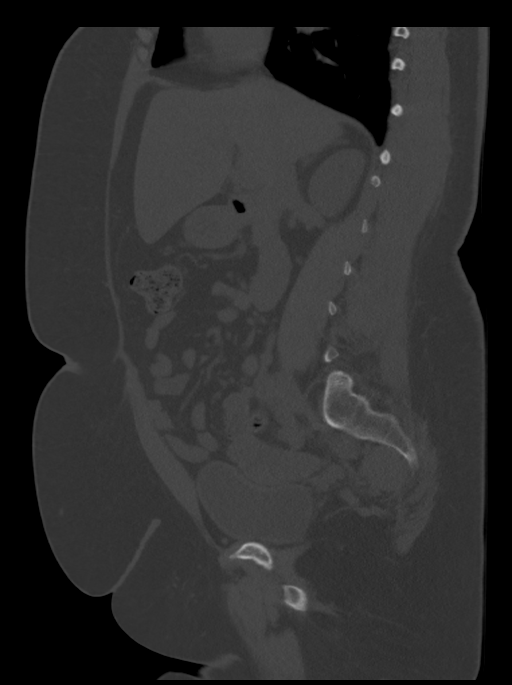
[im 85/153  bone]
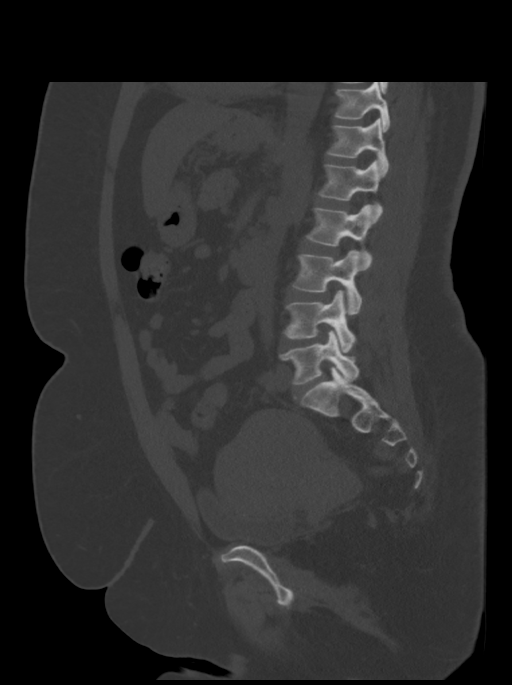
[im 102/153  bone]
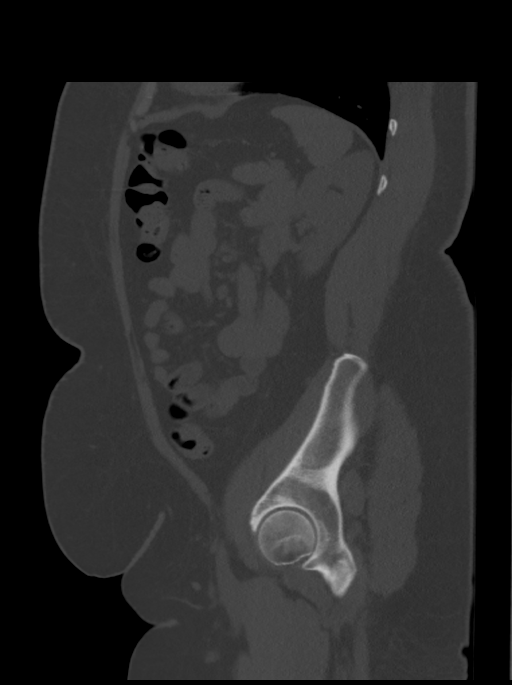

[Series 11: axial l/s soft · axial · 0.32mm/px · z∈[-280,-128]mm · 5 of 115 slices shown]
[im 20/115  soft-tissue]
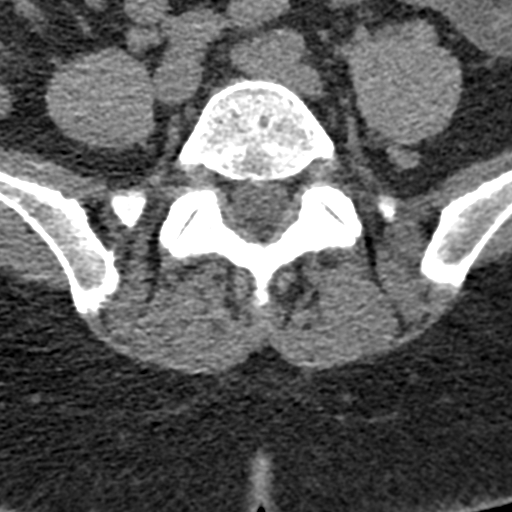
[im 39/115  soft-tissue]
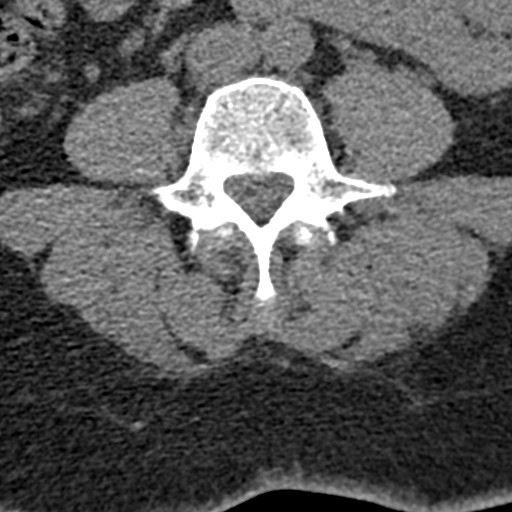
[im 58/115  soft-tissue]
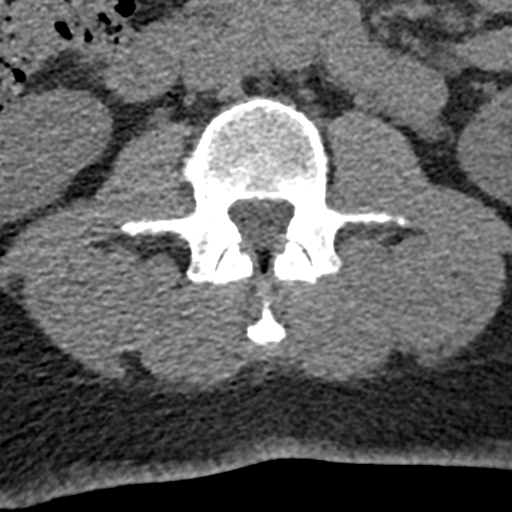
[im 77/115  soft-tissue]
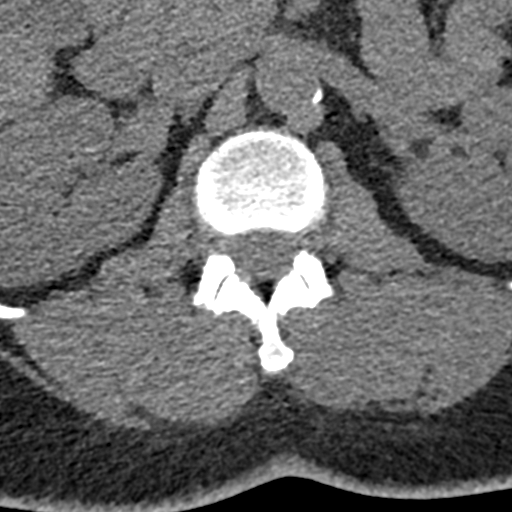
[im 96/115  soft-tissue]
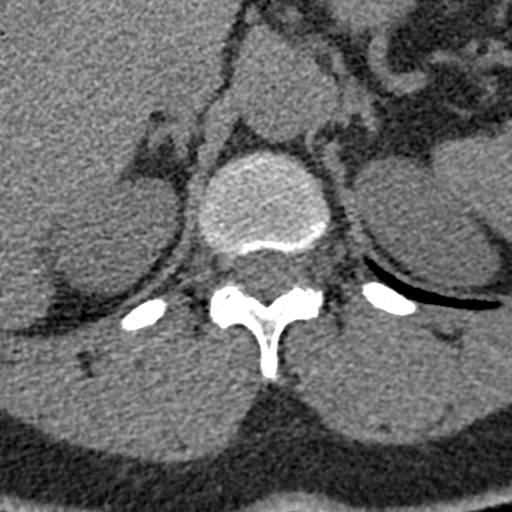

[16 of 33 positions shown; findings below may reference images not displayed]

FINDINGS: Segmentation: 5 non-rib-bearing lumbar vertebrae.

Alignment: Normal.

Vertebrae: No acute fracture or focal pathologic process.

Paraspinal and other soft tissues: Negative.

Disc levels: T11-12: Mild anterior spur formation.

T12-L1: Minimal anterior spur formation.

L1-2: Minimal anterior spur formation.

L2-3: Minimal anterior spur formation.

L3-4: Mild anterior spur formation. Mild diffuse disc protrusion and
mild bilateral facet and ligamentum flavum hypertrophy. These
changes are producing mild canal stenosis and mild bilateral
foraminal stenosis.

L4-5: Mild diffuse disc protrusion and mild bilateral facet and
ligamentum flavum hypertrophy. These changes are producing mild
canal stenosis and mild bilateral foraminal stenosis.

L5-S1: Mild anterior disc calcification. No significant disc
protrusion or spinal stenosis.

Other: Sclerosis on both sides of the sacroiliac joints with vacuum
phenomena in the joints.
IMPRESSION: 1. No fracture or subluxation.
2. Mild degenerative changes, as described above.
3. Bilateral sacroiliitis.

## 2018-08-09 MED ORDER — OXYCODONE-ACETAMINOPHEN 5-325 MG PO TABS: 1.0000 | ORAL_TABLET | Freq: Four times a day (QID) | ORAL | 0 refills | Status: DC | PRN

## 2018-08-09 MED ORDER — ONDANSETRON HCL 4 MG/2ML IJ SOLN
4.0000 mg | Freq: Once | INTRAMUSCULAR | Status: AC
  Administered 2018-08-09: 4 mg via INTRAVENOUS
  Filled 2018-08-09: qty 2

## 2018-08-09 MED ORDER — ONDANSETRON 4 MG PO TBDP: ORAL_TABLET | ORAL | 0 refills | Status: DC

## 2018-08-09 MED ORDER — MORPHINE SULFATE (PF) 4 MG/ML IV SOLN
4.0000 mg | Freq: Once | INTRAVENOUS | Status: AC
  Administered 2018-08-09: 4 mg via INTRAVENOUS
  Filled 2018-08-09: qty 1

## 2018-08-09 MED ORDER — SODIUM CHLORIDE 0.9 % IV BOLUS
1000.0000 mL | Freq: Once | INTRAVENOUS | Status: AC
  Administered 2018-08-09: 1000 mL via INTRAVENOUS

## 2018-08-09 MED FILL — ONDANSETRON ODT 4 MG TABLET: 4 | 2 days supply | Qty: 10 | Fill #0

## 2018-08-09 MED FILL — OXYCODONE-ACETAMINOPHEN 5-3: 5-325 | 2 days supply | Qty: 8 | Fill #0

## 2018-08-09 NOTE — Discharge Instructions (Addendum)
Take motrin, tylenol for pain. You likely passed a gallstone.   Eat low fat diet   Take percocet for severe pain. Take zofran for nausea.   See your doctor  Return to ER if you have worse abdominal pain, vomiting, fever, trouble urinating.

## 2018-08-09 NOTE — ED Triage Notes (Signed)
Reports right sided flank pain that began last night.  Denies injury, dysuria, hematuria.  Reports the pain as a "constant jabbing".

## 2018-08-09 NOTE — ED Provider Notes (Signed)
[facility] EMERGENCY DEPARTMENT Provider Note   CSN: [id number] Arrival date & time: 08/09/18  0930     History   Chief Complaint Chief Complaint  Patient presents with  . Flank Pain    HPI Shannon Oliver is a 58 y.o. female history of diabetes, hypertension, hyperlipidemia, here presenting with right upper quadrant and right flank pain.  Patient states that she has right flank pain and right-sided back pain starting yesterday.  She states it was a stabbing constant pain.  She states that she was nauseated but had no vomiting.  No associated fevers.  She denies any dysuria or hematuria.  She went to urgent care had a normal urinalysis and had right upper quadrant tenderness so sent here for possible biliary colic or renal colic.  Patient states that she had no history of gallstones or kidney stones in the past.  Denies any chest pain or shortness of breath.  The history is provided by the patient.    Past Medical History:  Diagnosis Date  . Abnormal EKG    LVH with strain  . Acid reflux   . Depression   . Diabetes mellitus    A1C over 9  . HLD (hyperlipidemia)   . Hypertension   . Noncompliance   . Obesity     Patient Active Problem List   Diagnosis Date Noted  . Postmenopausal bleeding 06/05/2017  . Neck pain 03/01/2014  . Edema 01/25/2013  . DM (diabetes mellitus) ([place]) 01/25/2013  . Dyspnea 01/25/2013  . Abnormal ECG 01/25/2013  . Hypertension   . Acid reflux     Past Surgical History:  Procedure Laterality Date  . COLONOSCOPY WITH PROPOFOL N/A 04/29/2015   Procedure: COLONOSCOPY WITH PROPOFOL;  Surgeon: Garlan Fair, MD;  Location: [facility];  Service: Endoscopy;  Laterality: N/A;  . HYSTEROSCOPY W/D&C N/A 09/07/2017   Procedure: DILATATION AND CURETTAGE /HYSTEROSCOPY;  Surgeon: Sloan Leiter, MD;  Location: [facility];  Service: Gynecology;  Laterality: N/A;  . KNEE ARTHROSCOPY W/ MENISCAL REPAIR Right      OB History    Gravida  3   Para  3   Term  3   Preterm      AB      Living  3     SAB      TAB      Ectopic      Multiple      Live Births  3            Home Medications    Prior to Admission medications   Medication Sig Start Date End Date Taking? Authorizing Provider  acetaminophen (TYLENOL) 500 MG tablet Take 500-1,000 mg by mouth every 6 (six) hours as needed for mild pain.    [provider]  amLODipine (NORVASC) 10 MG tablet TAKE 1 TABLET EVERY DAY ONCE A DAY ORALLY 90 03/13/18   Elayne Snare, MD  Continuous Blood Gluc Sensor (FREESTYLE LIBRE 14 DAY SENSOR) MISC 1 Units by Does not apply route every 14 (fourteen) days. 03/13/18   Elayne Snare, MD  escitalopram (LEXAPRO) 10 MG tablet  09/14/17   [provider]  glimepiride (AMARYL) 2 MG tablet Take 2 mg by mouth daily before breakfast.    [provider]  glucose blood (ACCU-CHEK GUIDE) test strip Use as instructed to check blood sugar one time daily. 11/16/17   Elayne Snare, MD  Insulin Syringe-Needle U-100 30G X 1/2" 0.3 ML MISC Use twice  a day with insulin 10/17/17   Elayne Snare, MD  losartan-hydrochlorothiazide (HYZAAR) 100-25 MG per tablet Take 1 tablet by mouth every morning.     [provider]  MELATONIN PO Take by mouth.    [provider]  metFORMIN (GLUCOPHAGE-XR) 500 MG 24 hr tablet 500 mg 2 (two) times daily. TAKE 1 TABLET (500MG ) BY MOUTH TWICE DAILY. 08/14/17   [provider]  metoprolol succinate (TOPROL-XL) 100 MG 24 hr tablet  09/14/17   [provider]  Multiple Vitamins-Calcium (ONE-A-DAY WOMENS PO) Take 1 tablet by mouth daily.    [provider]  NOVOLOG MIX 70/30 FLEXPEN (70-30) 100 UNIT/ML FlexPen INJECT 20 UNITS INTO THE SKIN TWICE A DAY Patient not taking: INJECT 15 UNITS UNDER THE SKIN ONCE DAILY AFTER DINNER. 04/05/18   Elayne Snare, MD  Semaglutide (RYBELSUS) 7 MG TABS Take 1 tablet by mouth daily. TAKE 1 (7MG ) TABLET BY MOUTH ONCE DAILY.  07/19/18   Elayne Snare, MD  simvastatin (ZOCOR) 20 MG tablet TAKE 1 TABLET BY MOUTH EVERY EVENING AT BEDTIME 07/28/17   [provider]    Family History Family History  Problem Relation Age of Onset  . Diabetes Neg Hx     Social History Social History   Tobacco Use  . Smoking status: Never Smoker  . Smokeless tobacco: Never Used  Substance Use Topics  . Alcohol use: No  . Drug use: No     Allergies   Hydrocodone   Review of Systems Review of Systems  Genitourinary: Positive for flank pain.  All other systems reviewed and are negative.    Physical Exam Updated Vital Signs BP (!) 141/76 (BP Location: Left Arm)   Pulse 74   Temp 97.6 F (36.4 C) (Oral)   Resp 18   Ht 5\' 6"  (1.676 m)   Wt 114.8 kg   LMP 08/02/2013 Comment: spotting  SpO2 99%   BMI 40.84 kg/m   Physical Exam Vitals signs and nursing note reviewed.  Constitutional:      Comments: Uncomfortable   HENT:     Head: Normocephalic.     Mouth/Throat:     Mouth: Mucous membranes are moist.  Eyes:     Extraocular Movements: Extraocular movements intact.     Pupils: Pupils are equal, round, and reactive to light.  Neck:     Musculoskeletal: Normal range of motion.  Cardiovascular:     Rate and Rhythm: Normal rate and regular rhythm.  Pulmonary:     Effort: Pulmonary effort is normal.     Breath sounds: Normal breath sounds.  Abdominal:     General: Abdomen is flat.     Palpations: Abdomen is soft.     Comments: Mild RUQ tenderness, mild murphy's. Mild R CVAT   Musculoskeletal: Normal range of motion.  Skin:    General: Skin is warm.     Capillary Refill: Capillary refill takes less than 2 seconds.  Neurological:     General: No focal deficit present.     Mental Status: She is alert and oriented to person, place, and time.  Psychiatric:        Mood and Affect: Mood normal.        Behavior: Behavior normal.      ED Treatments / Results  Labs (all labs ordered are listed, but  only abnormal results are displayed) Labs Reviewed  CBC WITH DIFFERENTIAL/PLATELET - Abnormal; Notable for the following components:      Result Value  Hemoglobin 11.7 (*)    All other components within normal limits  COMPREHENSIVE METABOLIC PANEL - Abnormal; Notable for the following components:   Potassium 3.0 (*)    Glucose, Bld 122 (*)    All other components within normal limits  URINALYSIS, ROUTINE W REFLEX MICROSCOPIC  PREGNANCY, URINE  LIPASE, BLOOD  TROPONIN I    EKG None  Radiology US Abdomen Complete  Result Date: 08/09/2018 CLINICAL DATA:  Right-sided flank pain EXAM: ABDOMEN ULTRASOUND COMPLETE COMPARISON:  None. FINDINGS: Gallbladder: No gallstones or wall thickening visualized. No sonographic Murphy sign noted by sonographer. Common bile duct: Diameter: 3.3 mm. Liver: No focal lesion identified. Within normal limits in parenchymal echogenicity. Portal vein is patent on color Doppler imaging with normal direction of blood flow towards the liver. IVC: No abnormality visualized. Pancreas: Visualized portion unremarkable. Spleen: Size and appearance within normal limits. Right Kidney: Length: 10.8 cm. Echogenicity within normal limits. No mass or hydronephrosis visualized. Left Kidney: Length: 14.0 cm. Echogenic focus is noted with mild posterior acoustical shadowing measuring 1 cm. This is consistent with a nonobstructing stone. Abdominal aorta: Mild atherosclerotic changes are noted without aneurysmal dilatation. Other findings: None. IMPRESSION: Nonobstructing left renal stone. No other focal abnormality is seen. Electronically Signed   By: Inez Catalina M.D.   On: 08/09/2018 12:40   Ct L-spine No Charge  Result Date: 08/09/2018 CLINICAL DATA:  Right flank pain since yesterday. EXAM: CT LUMBAR SPINE WITHOUT CONTRAST TECHNIQUE: Multidetector CT imaging of the lumbar spine was performed without intravenous contrast administration. Multiplanar CT image reconstructions were also  generated. COMPARISON:  Abdomen and pelvis CT obtained at the same time. FINDINGS: Segmentation: 5 non-rib-bearing lumbar vertebrae. Alignment: Normal. Vertebrae: No acute fracture or focal pathologic process. Paraspinal and other soft tissues: Negative. Disc levels: T11-12: Mild anterior spur formation. T12-L1: Minimal anterior spur formation. L1-2: Minimal anterior spur formation. L2-3: Minimal anterior spur formation. L3-4: Mild anterior spur formation. Mild diffuse disc protrusion and mild bilateral facet and ligamentum flavum hypertrophy. These changes are producing mild canal stenosis and mild bilateral foraminal stenosis. L4-5: Mild diffuse disc protrusion and mild bilateral facet and ligamentum flavum hypertrophy. These changes are producing mild canal stenosis and mild bilateral foraminal stenosis. L5-S1: Mild anterior disc calcification. No significant disc protrusion or spinal stenosis. Other: Sclerosis on both sides of the sacroiliac joints with vacuum phenomena in the joints. IMPRESSION: 1. No fracture or subluxation. 2. Mild degenerative changes, as described above. 3. Bilateral sacroiliitis. Electronically Signed   By: Claudie Revering M.D.   On: 08/09/2018 14:41   Ct Renal Stone Study  Result Date: 08/09/2018 CLINICAL DATA:  Acute right flank pain. EXAM: CT ABDOMEN AND PELVIS WITHOUT CONTRAST TECHNIQUE: Multidetector CT imaging of the abdomen and pelvis was performed following the standard protocol without IV contrast. COMPARISON:  Pelvic ultrasound of June 15, 2017. FINDINGS: Lower chest: No acute abnormality. Hepatobiliary: No focal liver abnormality is seen. No gallstones, gallbladder wall thickening, or biliary dilatation. Pancreas: Unremarkable. No pancreatic ductal dilatation or surrounding inflammatory changes. Spleen: Normal in size without focal abnormality. Adrenals/Urinary Tract: Adrenal glands are unremarkable. Kidneys are normal, without renal calculi, focal lesion, or hydronephrosis.  Bladder is unremarkable. Stomach/Bowel: Stomach is within normal limits. Appendix appears normal. No evidence of bowel wall thickening, distention, or inflammatory changes. Vascular/Lymphatic: No significant vascular findings are present. No enlarged abdominal or pelvic lymph nodes. Reproductive: Enlarged fibroid uterus is noted. No adnexal abnormality is noted. Other: No abdominal wall hernia or abnormality. No abdominopelvic ascites.  Musculoskeletal: No acute or significant osseous findings. IMPRESSION: No hydronephrosis or renal obstruction is noted. No renal or ureteral calculi are noted. Enlarged fibroid uterus is noted. Electronically Signed   By: Marijo Conception, M.D.   On: 08/09/2018 14:07    Procedures Procedures (including critical care time)  Medications Ordered in ED Medications  sodium chloride 0.9 % bolus 1,000 mL (0 mLs Intravenous Stopped 08/09/18 1241)  morphine 4 MG/ML injection 4 mg (4 mg Intravenous Given 08/09/18 1014)  ondansetron (ZOFRAN) injection 4 mg (4 mg Intravenous Given 08/09/18 1014)  morphine 4 MG/ML injection 4 mg (4 mg Intravenous Given 08/09/18 1302)     Initial Impression / Assessment and Plan / ED Course  I have reviewed the triage vital signs and the nursing notes.  Pertinent labs & imaging results that were available during my care of the patient were reviewed by me and considered in my medical decision making (see chart for details).    Shannon Oliver is a 58 y.o. female here with RUQ pain, R flank pain. Consider renal colic vs biliary colic. Will get labs, UA, LFTs, lipase, abdominal US.   3:09 PM US showed no gallstones, just incidental L intra renal stone and no hydro. Still had pain so CT renal stone performed and was unremarkable. There is incidental fibroid but she has no lower abdominal tenderness. CT lumbar spine showed some stenosis but no obvious compression. I wonder if she passed a gallstone. Will give pain meds, zofran. Told her to follow up with  PCP and eat low fat diet.    Final Clinical Impressions(s) / ED Diagnoses   Final diagnoses:  Back pain    ED Discharge Orders    None       Drenda Freeze, MD 08/09/18 1510

## 2018-08-24 ENCOUNTER — Other Ambulatory Visit: Payer: Self-pay

## 2018-08-25 ENCOUNTER — Other Ambulatory Visit: Payer: Self-pay

## 2018-08-25 MED ORDER — FREESTYLE LIBRE 14 DAY SENSOR MISC: 1.0000 [IU] | 4 refills | Status: DC

## 2018-08-28 ENCOUNTER — Other Ambulatory Visit: Payer: BLUE CROSS/BLUE SHIELD

## 2018-08-30 ENCOUNTER — Ambulatory Visit: Payer: BLUE CROSS/BLUE SHIELD | Admitting: Endocrinology

## 2018-09-01 ENCOUNTER — Other Ambulatory Visit: Payer: Self-pay

## 2018-09-01 MED ORDER — FREESTYLE LIBRE 14 DAY SENSOR MISC: 1.0000 [IU] | 4 refills | Status: DC

## 2018-09-04 ENCOUNTER — Encounter: Payer: Self-pay | Admitting: Obstetrics and Gynecology

## 2018-09-04 ENCOUNTER — Ambulatory Visit: Payer: BLUE CROSS/BLUE SHIELD | Admitting: Obstetrics and Gynecology

## 2018-09-04 VITALS — BP 164/95 | HR 103 | Wt 255.0 lb

## 2018-09-04 DIAGNOSIS — N950 Postmenopausal bleeding: Secondary | ICD-10-CM | POA: Diagnosis not present

## 2018-09-04 DIAGNOSIS — R102 Pelvic and perineal pain: Secondary | ICD-10-CM | POA: Diagnosis not present

## 2018-09-04 MED ORDER — MISOPROSTOL 200 MCG PO TABS: 200.0000 ug | ORAL_TABLET | Freq: Four times a day (QID) | ORAL | 0 refills | Status: DC

## 2018-09-04 MED ORDER — MISOPROSTOL 200 MCG PO TABS: 600.0000 ug | ORAL_TABLET | Freq: Once | ORAL | 0 refills | Status: DC

## 2018-09-04 NOTE — Progress Notes (Signed)
GYNECOLOGY OFFICE FOLLOW UP NOTE  History:  58 y.o. [id number] here today for follow up for post menopausal bleeding.  Started having very heavy bleeding again in January, lasted about a week. Had very heavy bleeding including large clots and had to stay home from work for two days due to bleeding. Started having pain in February, states she had pelvic pain so intense she stayed up all night. She went to ED and had full workup, no source for pain but patient felt it may be due to period, then had spotting again. Spotting was 2-3 days.    Past Medical History:  Diagnosis Date  . Abnormal EKG    LVH with strain  . Acid reflux   . Depression   . Diabetes mellitus    A1C over 9  . HLD (hyperlipidemia)   . Hypertension   . Noncompliance   . Obesity     Past Surgical History:  Procedure Laterality Date  . COLONOSCOPY WITH PROPOFOL N/A 04/29/2015   Procedure: COLONOSCOPY WITH PROPOFOL;  Surgeon: Garlan Fair, MD;  Location: [facility];  Service: Endoscopy;  Laterality: N/A;  . HYSTEROSCOPY W/D&C N/A 09/07/2017   Procedure: DILATATION AND CURETTAGE /HYSTEROSCOPY;  Surgeon: Sloan Leiter, MD;  Location: [facility];  Service: Gynecology;  Laterality: N/A;  . KNEE ARTHROSCOPY W/ MENISCAL REPAIR Right      Current Outpatient Medications:  .  acetaminophen (TYLENOL) 500 MG tablet, Take 500-1,000 mg by mouth every 6 (six) hours as needed for mild pain., Disp: , Rfl:  .  amLODipine (NORVASC) 10 MG tablet, TAKE 1 TABLET EVERY DAY ONCE A DAY ORALLY 90, Disp: 30 tablet, Rfl: 1 .  glimepiride (AMARYL) 2 MG tablet, Take 2 mg by mouth daily before breakfast., Disp: , Rfl:  .  losartan-hydrochlorothiazide (HYZAAR) 100-25 MG per tablet, Take 1 tablet by mouth every morning. , Disp: , Rfl:  .  MELATONIN PO, Take by mouth., Disp: , Rfl:  .  metFORMIN (GLUCOPHAGE-XR) 500 MG 24 hr tablet, 500 mg 2 (two) times daily. TAKE 1 TABLET (500MG ) BY MOUTH TWICE DAILY., Disp: , Rfl: 5 .   metoprolol succinate (TOPROL-XL) 100 MG 24 hr tablet, , Disp: , Rfl: 0 .  Multiple Vitamins-Calcium (ONE-A-DAY WOMENS PO), Take 1 tablet by mouth daily., Disp: , Rfl:  .  ondansetron (ZOFRAN ODT) 4 MG disintegrating tablet, 4mg  ODT q4 hours prn nausea/vomit, Disp: 10 tablet, Rfl: 0 .  Continuous Blood Gluc Sensor (FREESTYLE LIBRE 14 DAY SENSOR) MISC, 1 Units by Does not apply route every 14 (fourteen) days. Apply 1 sensor to body once every 14 days for blood sugar monitoring., Disp: 2 each, Rfl: 4 .  escitalopram (LEXAPRO) 10 MG tablet, , Disp: , Rfl: 0 .  glucose blood (ACCU-CHEK GUIDE) test strip, Use as instructed to check blood sugar one time daily., Disp: 50 each, Rfl: 12 .  Insulin Syringe-Needle U-100 30G X 1/2" 0.3 ML MISC, Use twice a day with insulin, Disp: 100 each, Rfl: 0 .  misoprostol (CYTOTEC) 200 MCG tablet, Take 3 tablets (600 mcg total) by mouth once for 1 dose. Take three pills at once by mouth. Take them around 10 pm the night before your appointment., Disp: 3 tablet, Rfl: 0 .  NOVOLOG MIX 70/30 FLEXPEN (70-30) 100 UNIT/ML FlexPen, INJECT 20 UNITS INTO THE SKIN TWICE A DAY (Patient not taking: INJECT 15 UNITS UNDER THE SKIN ONCE DAILY AFTER DINNER.), Disp: 15 pen, Rfl: 1 .  oxyCODONE-acetaminophen (PERCOCET) 5-325  MG tablet, Take 1 tablet by mouth every 6 (six) hours as needed. (Patient not taking: Reported on 09/04/2018), Disp: 8 tablet, Rfl: 0 .  Semaglutide (RYBELSUS) 7 MG TABS, Take 1 tablet by mouth daily. TAKE 1 (7MG ) TABLET BY MOUTH ONCE DAILY. (Patient not taking: Reported on 09/04/2018), Disp: 30 tablet, Rfl: 11 .  simvastatin (ZOCOR) 20 MG tablet, TAKE 1 TABLET BY MOUTH EVERY EVENING AT BEDTIME, Disp: , Rfl: 2  The following portions of the patient's history were reviewed and updated as appropriate: allergies, current medications, past family history, past medical history, past social history, past surgical history and problem list.   Review of Systems:  Pertinent items  noted in HPI and remainder of comprehensive ROS otherwise negative.   Objective:  Physical Exam BP (!) 164/95   Pulse (!) 103   Wt 255 lb (115.7 kg)   LMP 08/02/2013 Comment: spotting  BMI 41.16 kg/m  CONSTITUTIONAL: Well-developed, well-nourished female in no acute distress.  HENT:  Normocephalic, atraumatic. External right and left ear normal. Oropharynx is clear and moist EYES: Conjunctivae and EOM are normal. Pupils are equal, round, and reactive to light. No scleral icterus.  NECK: Normal range of motion, supple, no masses SKIN: Skin is warm and dry. No rash noted. Not diaphoretic. No erythema. No pallor. NEUROLOGIC: Alert and oriented to person, place, and time. Normal reflexes, muscle tone coordination. No cranial nerve deficit noted. PSYCHIATRIC: Normal mood and affect. Normal behavior. Normal judgment and thought content. CARDIOVASCULAR: Normal heart rate noted RESPIRATORY: Effort normal, no problems with respiration noted ABDOMEN: Soft, no distention noted.   PELVIC: deferred MUSCULOSKELETAL: Normal range of motion. No edema noted.  Labs and Imaging   Assessment & Plan:   1. Postmenopausal bleeding Recurrent post menopausal bleeding, reviewed recommendation for repeat sampling despite negative path last year. Reviewed need to rule out malignancy. Recommended sampling in office and using cytotec as last EMB was scant sample. Patient agreeable, will return for EMB with cytotec.   2. Pelvic pain Likely secondary to bleeding/hormones  Routine preventative health maintenance measures emphasized. Please refer to After Visit Summary for other counseling recommendations.   Return in about 2 weeks (around 09/18/2018) for Followup.   Feliz Beam, M.D. Attending Center for [employer] farm manager)

## 2018-09-04 NOTE — Progress Notes (Signed)
Had [facility] March 2019 and no bleeding since. Started bleeding again this January with large clots around 07/18/18.  Light bleeding in Feb with a lot of cramping. Screening scores high. Offered Pt to speak with Roselyn Reef if interested. Pt states she has had anxiety/depression for awhile is does not want to speak with Roselyn Reef

## 2018-09-11 DIAGNOSIS — Z8739 Personal history of other diseases of the musculoskeletal system and connective tissue: Secondary | ICD-10-CM | POA: Diagnosis not present

## 2018-09-11 DIAGNOSIS — Z8639 Personal history of other endocrine, nutritional and metabolic disease: Secondary | ICD-10-CM | POA: Diagnosis not present

## 2018-09-11 DIAGNOSIS — Z0189 Encounter for other specified special examinations: Secondary | ICD-10-CM | POA: Diagnosis not present

## 2018-09-11 DIAGNOSIS — Z8679 Personal history of other diseases of the circulatory system: Secondary | ICD-10-CM | POA: Diagnosis not present

## 2018-09-20 ENCOUNTER — Telehealth: Payer: Self-pay | Admitting: Obstetrics & Gynecology

## 2018-09-20 NOTE — Telephone Encounter (Signed)
Called to ask if there were any changes to her appointment due to the virus. Informed the patient as of right now her appointment is as is on schedule. Our clinic will contact patients if any change are required in the future.

## 2018-09-27 ENCOUNTER — Telehealth: Payer: Self-pay | Admitting: Obstetrics and Gynecology

## 2018-09-27 NOTE — Telephone Encounter (Signed)
Called the patient and left a detailed voicemail about the COV19 restrictions.

## 2018-09-28 ENCOUNTER — Encounter: Payer: Self-pay | Admitting: Obstetrics and Gynecology

## 2018-09-28 ENCOUNTER — Other Ambulatory Visit (HOSPITAL_COMMUNITY)
Admission: RE | Admit: 2018-09-28 | Discharge: 2018-09-28 | Disposition: A | Discharge status: Home / Self Care | Payer: BLUE CROSS/BLUE SHIELD | Source: Ambulatory Visit | Attending: Obstetrics and Gynecology | Admitting: Obstetrics and Gynecology

## 2018-09-28 ENCOUNTER — Ambulatory Visit (INDEPENDENT_AMBULATORY_CARE_PROVIDER_SITE_OTHER): Payer: BLUE CROSS/BLUE SHIELD | Admitting: Obstetrics and Gynecology

## 2018-09-28 ENCOUNTER — Other Ambulatory Visit: Payer: Self-pay

## 2018-09-28 VITALS — BP 142/90 | HR 90 | Ht 66.0 in | Wt 254.8 lb

## 2018-09-28 DIAGNOSIS — N950 Postmenopausal bleeding: Secondary | ICD-10-CM

## 2018-09-28 LAB — POCT PREGNANCY, URINE: Preg Test, Ur: NEGATIVE

## 2018-09-28 NOTE — Progress Notes (Signed)
ENDOMETRIAL BIOPSY      Shannon Oliver is a 58 y.o. [id number] here for endometrial biopsy.  The indications for endometrial biopsy were reviewed.  Risks of the biopsy including cramping, bleeding, infection, uterine perforation, inadequate specimen and need for additional procedures were discussed. The patient states she understands and agrees to undergo procedure today. Consent was signed. Time out was performed.   Indications: post menopausal bleeding Urine HCG: negative  A bivalve speculum was placed into the vagina and the cervix was easily visualized and was prepped with Betadine x2. A single-toothed tenaculum was placed on the anterior lip of the cervix to stabilize it. The 3 mm pipelle was introduced into the endometrial cavity without difficulty to a depth of 7.5 cm, and a moderate amount of tissue was obtained and sent to pathology. This was repeated for a total of 3 passes. The instruments were removed from the patient's vagina. Minimal bleeding from the cervix at the tenaculum was noted.   The patient tolerated the procedure well. Routine post-procedure instructions were given to the patient.     Feliz Beam, M.D. Attending Center for [employer] farm manager)

## 2018-09-29 ENCOUNTER — Encounter: Payer: Self-pay | Admitting: *Deleted

## 2018-10-31 DIAGNOSIS — H538 Other visual disturbances: Secondary | ICD-10-CM | POA: Diagnosis not present

## 2018-10-31 DIAGNOSIS — H2511 Age-related nuclear cataract, right eye: Secondary | ICD-10-CM | POA: Diagnosis not present

## 2018-10-31 DIAGNOSIS — E1165 Type 2 diabetes mellitus with hyperglycemia: Secondary | ICD-10-CM | POA: Diagnosis not present

## 2018-12-11 DIAGNOSIS — E669 Obesity, unspecified: Secondary | ICD-10-CM | POA: Diagnosis not present

## 2018-12-11 DIAGNOSIS — E1140 Type 2 diabetes mellitus with diabetic neuropathy, unspecified: Secondary | ICD-10-CM | POA: Diagnosis not present

## 2018-12-11 DIAGNOSIS — E785 Hyperlipidemia, unspecified: Secondary | ICD-10-CM | POA: Diagnosis not present

## 2018-12-11 DIAGNOSIS — G4700 Insomnia, unspecified: Secondary | ICD-10-CM | POA: Diagnosis not present

## 2018-12-11 DIAGNOSIS — I10 Essential (primary) hypertension: Secondary | ICD-10-CM | POA: Diagnosis not present

## 2018-12-15 DIAGNOSIS — I10 Essential (primary) hypertension: Secondary | ICD-10-CM | POA: Insufficient documentation

## 2018-12-15 DIAGNOSIS — R0789 Other chest pain: Secondary | ICD-10-CM | POA: Diagnosis not present

## 2018-12-15 DIAGNOSIS — M549 Dorsalgia, unspecified: Secondary | ICD-10-CM | POA: Diagnosis present

## 2018-12-15 DIAGNOSIS — R002 Palpitations: Secondary | ICD-10-CM | POA: Diagnosis not present

## 2018-12-15 DIAGNOSIS — E119 Type 2 diabetes mellitus without complications: Secondary | ICD-10-CM | POA: Diagnosis not present

## 2018-12-17 DIAGNOSIS — R002 Palpitations: Secondary | ICD-10-CM | POA: Diagnosis not present

## 2018-12-17 DIAGNOSIS — R0789 Other chest pain: Secondary | ICD-10-CM | POA: Diagnosis not present

## 2018-12-17 DIAGNOSIS — R0602 Shortness of breath: Secondary | ICD-10-CM | POA: Diagnosis not present

## 2018-12-17 DIAGNOSIS — I10 Essential (primary) hypertension: Secondary | ICD-10-CM | POA: Diagnosis not present

## 2018-12-18 DIAGNOSIS — E6609 Other obesity due to excess calories: Secondary | ICD-10-CM | POA: Insufficient documentation

## 2018-12-18 DIAGNOSIS — Z6841 Body Mass Index (BMI) 40.0 and over, adult: Secondary | ICD-10-CM | POA: Insufficient documentation

## 2018-12-18 DIAGNOSIS — I119 Hypertensive heart disease without heart failure: Secondary | ICD-10-CM | POA: Insufficient documentation

## 2018-12-23 ENCOUNTER — Other Ambulatory Visit: Payer: Self-pay

## 2018-12-23 ENCOUNTER — Encounter (HOSPITAL_BASED_OUTPATIENT_CLINIC_OR_DEPARTMENT_OTHER): Payer: Self-pay

## 2018-12-23 ENCOUNTER — Emergency Department (HOSPITAL_BASED_OUTPATIENT_CLINIC_OR_DEPARTMENT_OTHER)
Admission: EM | Admit: 2018-12-23 | Discharge: 2018-12-23 | Disposition: A | Discharge status: Home / Self Care | Payer: BLUE CROSS/BLUE SHIELD | Attending: Emergency Medicine | Admitting: Emergency Medicine

## 2018-12-23 ENCOUNTER — Emergency Department (HOSPITAL_BASED_OUTPATIENT_CLINIC_OR_DEPARTMENT_OTHER): Payer: BLUE CROSS/BLUE SHIELD

## 2018-12-23 DIAGNOSIS — I10 Essential (primary) hypertension: Secondary | ICD-10-CM | POA: Diagnosis not present

## 2018-12-23 DIAGNOSIS — E119 Type 2 diabetes mellitus without complications: Secondary | ICD-10-CM | POA: Insufficient documentation

## 2018-12-23 DIAGNOSIS — M5442 Lumbago with sciatica, left side: Secondary | ICD-10-CM | POA: Diagnosis not present

## 2018-12-23 DIAGNOSIS — M545 Low back pain: Secondary | ICD-10-CM | POA: Diagnosis not present

## 2018-12-23 IMAGING — CR LUMBAR SPINE - 2-3 VIEW
3 series · 3 of 3 positions shown · non-contrast
Comparison: [DATE] CT

CLINICAL DATA: Acute low back pain for 1 day. No known injury.
Initial encounter.

EXAM:
LUMBAR SPINE - 2-3 VIEW

[t l-spine a.p.]
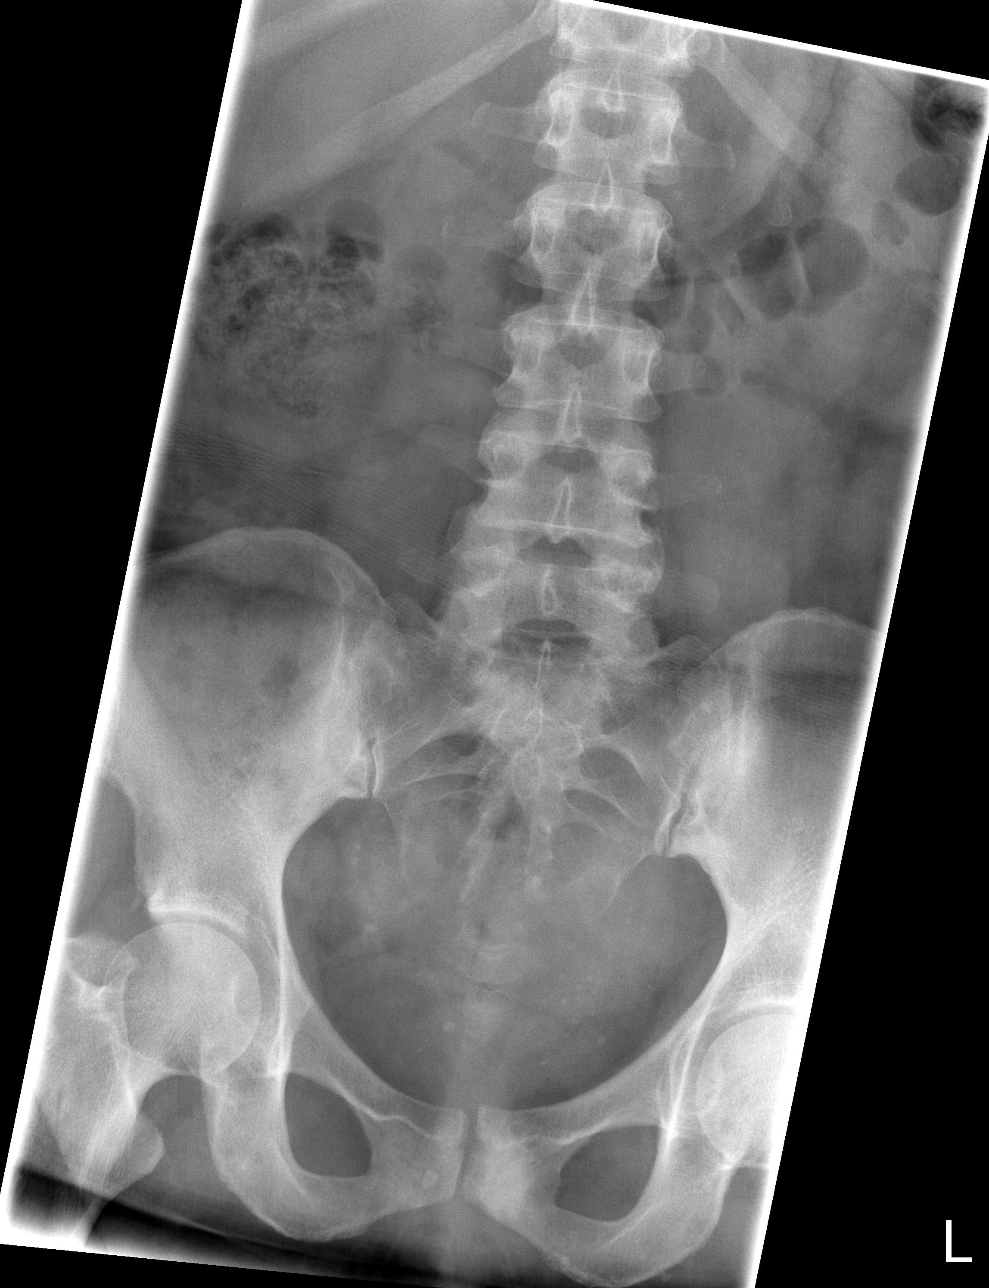

[t l-spine lat *]
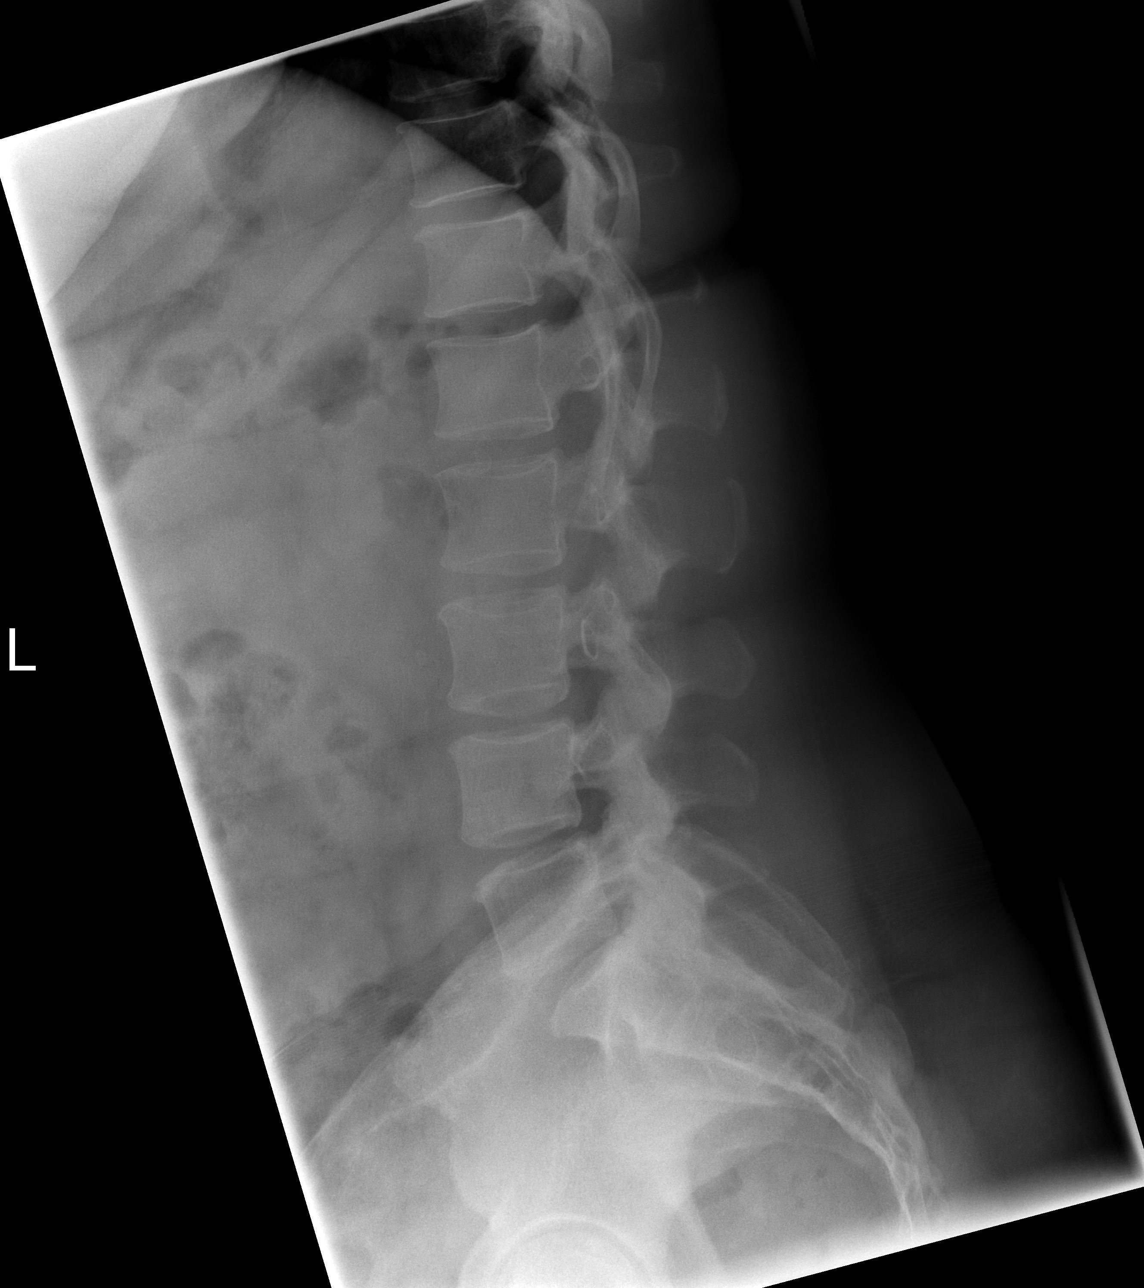

[t l-spine l5-s1 spot *]
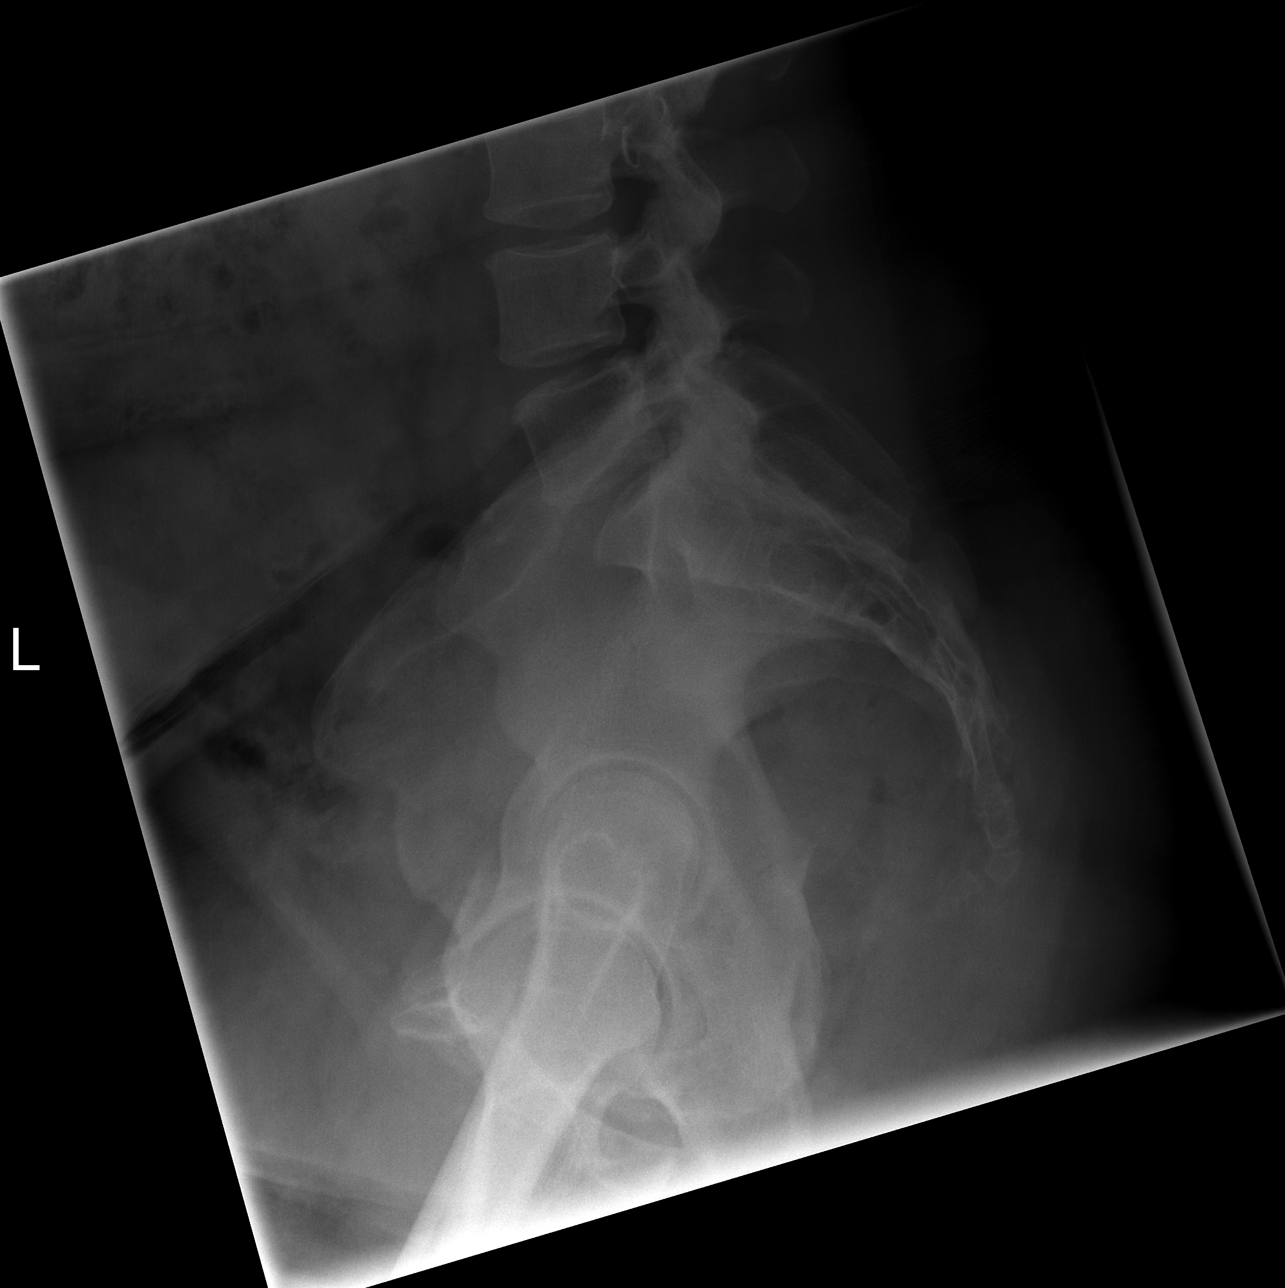

[3 of 3 positions shown; findings below may reference images not displayed]

FINDINGS: No acute fracture or subluxation.

Mild degenerative disc disease and facet arthropathy at L4-L5 and
L5-S1 again noted.

Sclerosis along the MEDIAL LEFT pubis/osteitis pubis again noted.
IMPRESSION: 1. No acute abnormality
2. Mild degenerative changes in the LOWER lumbar spine.

## 2018-12-23 MED ORDER — DIAZEPAM 5 MG PO TABS: 5.0000 mg | ORAL_TABLET | Freq: Three times a day (TID) | ORAL | 0 refills | Status: DC | PRN

## 2018-12-23 MED ORDER — LIDOCAINE 5 % EX OINT: 1.0000 "application " | TOPICAL_OINTMENT | Freq: Three times a day (TID) | CUTANEOUS | 0 refills | Status: DC | PRN

## 2018-12-23 MED ORDER — KETOROLAC TROMETHAMINE 30 MG/ML IJ SOLN
30.0000 mg | Freq: Once | INTRAMUSCULAR | Status: AC
  Administered 2018-12-23: 30 mg via INTRAMUSCULAR
  Filled 2018-12-23: qty 1

## 2018-12-23 MED ORDER — DIAZEPAM 5 MG/ML IJ SOLN
5.0000 mg | Freq: Once | INTRAMUSCULAR | Status: AC
  Administered 2018-12-23: 5 mg via INTRAMUSCULAR
  Filled 2018-12-23: qty 2

## 2018-12-23 NOTE — ED Notes (Signed)
ED Provider at bedside. 

## 2018-12-23 NOTE — ED Provider Notes (Signed)
Emergency Department Provider Note   I have reviewed the triage vital signs and the nursing notes.   HISTORY  Chief Complaint Back Pain   HPI Shannon Oliver is a 58 y.o. female with past medical history reviewed below presents to the emergency department for evaluation of left lower back pain radiating down the left leg.  Symptoms began yesterday.  No apparent injury or other provoking factor.  Patient describes pain which is severe and worse with movement.  Patient states that when she stands she has worsening pain in her knee will buckle but denies focal weakness or numbness in the leg.  No groin numbness.  No fevers.  No history of cancer.  She has chronic urinary incontinence which has continued and not worsened.  No fecal incontinence or urinary retention symptoms.   Past Medical History:  Diagnosis Date  . Abnormal EKG    LVH with strain  . Acid reflux   . Depression   . Diabetes mellitus    A1C over 9  . HLD (hyperlipidemia)   . Hypertension   . Noncompliance   . Obesity     Patient Active Problem List   Diagnosis Date Noted  . Postmenopausal bleeding 06/05/2017  . Neck pain 03/01/2014  . Edema 01/25/2013  . DM (diabetes mellitus) ([place]) 01/25/2013  . Dyspnea 01/25/2013  . Abnormal ECG 01/25/2013  . Hypertension   . Acid reflux     Past Surgical History:  Procedure Laterality Date  . COLONOSCOPY WITH PROPOFOL N/A 04/29/2015   Procedure: COLONOSCOPY WITH PROPOFOL;  Surgeon: Garlan Fair, MD;  Location: [facility];  Service: Endoscopy;  Laterality: N/A;  . HYSTEROSCOPY W/D&C N/A 09/07/2017   Procedure: DILATATION AND CURETTAGE /HYSTEROSCOPY;  Surgeon: Sloan Leiter, MD;  Location: [facility];  Service: Gynecology;  Laterality: N/A;  . KNEE ARTHROSCOPY W/ MENISCAL REPAIR Right     Allergies Hydrocodone  Family History  Problem Relation Age of Onset  . Diabetes Neg Hx     Social History Social History   Tobacco Use  . Smoking  status: Never Smoker  . Smokeless tobacco: Never Used  Substance Use Topics  . Alcohol use: Not Currently    Comment: occ  . Drug use: No    Review of Systems  Constitutional: No fever/chills Eyes: No visual changes. ENT: No sore throat. Cardiovascular: Denies chest pain. Respiratory: Denies shortness of breath. Gastrointestinal: No abdominal pain.  No nausea, no vomiting.  No diarrhea.  No constipation. Genitourinary: Negative for dysuria. Musculoskeletal: Positive for back pain radiating to the left leg.  Skin: Negative for rash. Neurological: Negative for headaches, focal weakness or numbness.  10-point ROS otherwise negative.  ____________________________________________   PHYSICAL EXAM:  VITAL SIGNS: ED Triage Vitals  Enc Vitals Group     BP 12/23/18 1347 (!) 145/84     Pulse Rate 12/23/18 1347 97     Resp 12/23/18 1347 20     Temp 12/23/18 1347 98.9 F (37.2 C)     Temp Source 12/23/18 1347 Oral     SpO2 12/23/18 1347 97 %     Weight 12/23/18 1348 255 lb (115.7 kg)     Height 12/23/18 1348 5\' 6"  (1.676 m)   Constitutional: Alert and oriented. Well appearing and in no acute distress. Eyes: Conjunctivae are normal. Head: Atraumatic. Nose: No congestion/rhinnorhea. Mouth/Throat: Mucous membranes are moist.  Neck: No stridor.  Cardiovascular: Normal rate, regular rhythm. Good peripheral circulation. Grossly normal heart sounds.  Respiratory: Normal respiratory effort.  No retractions. Lungs CTAB. Gastrointestinal: Soft and nontender. No distention.  Musculoskeletal: No lower extremity tenderness nor edema. No gross deformities of extremities. No midline thoracic or lumbar spine tenderness to palpation. Patient with focal tenderness in the left paraspinal musculature at approximately L1 level.  Neurologic:  Normal speech and language. No gross focal neurologic deficits are appreciated. Normal strength and sensation in the bilateral LEs.  Skin:  Skin is warm, dry  and intact. No rash noted.  ____________________________________________  RADIOLOGY  Dg Lumbar Spine 2-3 Views  Result Date: 12/23/2018 CLINICAL DATA:  Acute low back pain for 1 day. No known injury. Initial encounter. EXAM: LUMBAR SPINE - 2-3 VIEW COMPARISON:  08/09/2018 CT FINDINGS: No acute fracture or subluxation. Mild degenerative disc disease and facet arthropathy at L4-L5 and L5-S1 again noted. 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 IMPRESSION: 1. No acute abnormality 2. Mild degenerative changes in the LOWER lumbar spine. Electronically Signed   By: Margarette Canada M.D.   On: 12/23/2018 14:54    ____________________________________________   PROCEDURES  Procedure(s) performed:   Procedures  None ____________________________________________   INITIAL IMPRESSION / ASSESSMENT AND PLAN / ED COURSE  Pertinent labs & imaging results that were available during my care of the patient were reviewed by me and considered in my medical decision making (see chart for details).   Presents to the emergency department for evaluation of back pain worse with movement.  Symptoms radiate down the left leg.  Patient with focal tenderness at or about the L1 level in the paraspinal musculature.  No midline spine tenderness.  Very low suspicion for spinal cord emergency requiring MRI.  No red flag signs or symptoms.  Exam is reassuring.  Plan for Toradol and Valium here along with plain film given the patient's age and no prior history of similar back pain.  No evidence to suspect UTI clinically.   Plain films reviewed. Patient more comfortable on reassessment. Plan for pain mgmt at home and PCP/Neurosurgery follow up if symptoms continue.  ____________________________________________  FINAL CLINICAL IMPRESSION(S) / ED DIAGNOSES  Final diagnoses:  Acute left-sided low back pain with left-sided sciatica     MEDICATIONS GIVEN DURING THIS VISIT:  Medications  ketorolac  (TORADOL) 30 MG/ML injection 30 mg (30 mg Intramuscular Given 12/23/18 1414)  diazepam (VALIUM) injection 5 mg (5 mg Intramuscular Given 12/23/18 1411)     NEW OUTPATIENT MEDICATIONS STARTED DURING THIS VISIT:  Discharge Medication List as of 12/23/2018  3:02 PM    START taking these medications   Details  diazepam (VALIUM) 5 MG tablet Take 1 tablet (5 mg total) by mouth every 8 (eight) hours as needed for muscle spasms., Starting Sat 12/23/2018, Normal    lidocaine (XYLOCAINE) 5 % ointment Apply 1 application topically 3 (three) times daily as needed., Starting Sat 12/23/2018, Normal        Note:  This document was prepared using Dragon voice recognition software and may include unintentional dictation errors.  Nanda Quinton, MD Emergency Medicine    Kolter Reaver, [facility], MD 12/24/18 [phone]

## 2018-12-23 NOTE — ED Notes (Signed)
Patient transported to X-ray 

## 2018-12-23 NOTE — Discharge Instructions (Signed)

## 2018-12-23 NOTE — ED Triage Notes (Signed)
L sided lumbar pain that radiates down L leg x 1 day. Pt states the pain has caused her knee to "buckle" a few times. Denies injury. Denies fever or urinary symptoms.

## 2018-12-25 DIAGNOSIS — Z20828 Contact with and (suspected) exposure to other viral communicable diseases: Secondary | ICD-10-CM | POA: Diagnosis not present

## 2019-01-05 ENCOUNTER — Other Ambulatory Visit: Payer: Self-pay | Admitting: Endocrinology

## 2019-01-11 DIAGNOSIS — G4730 Sleep apnea, unspecified: Secondary | ICD-10-CM | POA: Diagnosis not present

## 2019-02-21 DIAGNOSIS — I10 Essential (primary) hypertension: Secondary | ICD-10-CM | POA: Diagnosis not present

## 2019-02-21 DIAGNOSIS — E669 Obesity, unspecified: Secondary | ICD-10-CM | POA: Diagnosis not present

## 2019-02-21 DIAGNOSIS — E1140 Type 2 diabetes mellitus with diabetic neuropathy, unspecified: Secondary | ICD-10-CM | POA: Diagnosis not present

## 2019-02-21 DIAGNOSIS — Z8659 Personal history of other mental and behavioral disorders: Secondary | ICD-10-CM | POA: Diagnosis not present

## 2019-03-30 ENCOUNTER — Other Ambulatory Visit: Payer: Self-pay | Admitting: Endocrinology

## 2019-05-16 DIAGNOSIS — R002 Palpitations: Secondary | ICD-10-CM | POA: Diagnosis not present

## 2019-05-16 DIAGNOSIS — E119 Type 2 diabetes mellitus without complications: Secondary | ICD-10-CM | POA: Diagnosis not present

## 2019-05-16 DIAGNOSIS — I10 Essential (primary) hypertension: Secondary | ICD-10-CM | POA: Diagnosis not present

## 2019-05-16 DIAGNOSIS — R0789 Other chest pain: Secondary | ICD-10-CM | POA: Diagnosis not present

## 2019-05-17 DIAGNOSIS — R0789 Other chest pain: Secondary | ICD-10-CM | POA: Diagnosis not present

## 2019-05-17 DIAGNOSIS — E119 Type 2 diabetes mellitus without complications: Secondary | ICD-10-CM | POA: Diagnosis not present

## 2019-05-17 DIAGNOSIS — Z7984 Long term (current) use of oral hypoglycemic drugs: Secondary | ICD-10-CM | POA: Diagnosis not present

## 2019-05-17 DIAGNOSIS — R002 Palpitations: Secondary | ICD-10-CM | POA: Diagnosis not present

## 2019-05-19 DIAGNOSIS — I471 Supraventricular tachycardia: Secondary | ICD-10-CM | POA: Diagnosis not present

## 2019-05-19 DIAGNOSIS — I472 Ventricular tachycardia: Secondary | ICD-10-CM | POA: Diagnosis not present

## 2019-06-04 DIAGNOSIS — R0789 Other chest pain: Secondary | ICD-10-CM | POA: Diagnosis not present

## 2019-06-04 DIAGNOSIS — E785 Hyperlipidemia, unspecified: Secondary | ICD-10-CM | POA: Diagnosis not present

## 2019-06-04 DIAGNOSIS — R002 Palpitations: Secondary | ICD-10-CM | POA: Diagnosis not present

## 2019-06-04 DIAGNOSIS — I10 Essential (primary) hypertension: Secondary | ICD-10-CM | POA: Diagnosis not present

## 2019-08-08 ENCOUNTER — Other Ambulatory Visit: Payer: Self-pay | Admitting: Family Medicine

## 2019-08-08 DIAGNOSIS — R202 Paresthesia of skin: Secondary | ICD-10-CM | POA: Diagnosis not present

## 2019-08-08 DIAGNOSIS — E1140 Type 2 diabetes mellitus with diabetic neuropathy, unspecified: Secondary | ICD-10-CM | POA: Diagnosis not present

## 2019-08-08 DIAGNOSIS — Z1231 Encounter for screening mammogram for malignant neoplasm of breast: Secondary | ICD-10-CM

## 2019-08-08 DIAGNOSIS — E785 Hyperlipidemia, unspecified: Secondary | ICD-10-CM | POA: Diagnosis not present

## 2019-08-08 DIAGNOSIS — I10 Essential (primary) hypertension: Secondary | ICD-10-CM | POA: Diagnosis not present

## 2019-09-18 ENCOUNTER — Other Ambulatory Visit: Payer: Self-pay

## 2019-09-18 ENCOUNTER — Ambulatory Visit
Admission: RE | Admit: 2019-09-18 | Discharge: 2019-09-18 | Disposition: A | Discharge status: Home / Self Care | Payer: BC Managed Care – PPO | Source: Ambulatory Visit | Attending: Family Medicine | Admitting: Family Medicine

## 2019-09-18 DIAGNOSIS — Z1231 Encounter for screening mammogram for malignant neoplasm of breast: Secondary | ICD-10-CM

## 2019-09-18 IMAGING — MG DIGITAL SCREENING BILAT W/ TOMO W/ CAD
6 of 12 series · 6 of 36 positions shown · non-contrast
Comparison: Previous exam(s).

CLINICAL DATA: Screening.

EXAM:
DIGITAL SCREENING BILATERAL MAMMOGRAM WITH TOMO AND CAD

[L MLO synth-2D (1 of 2)]
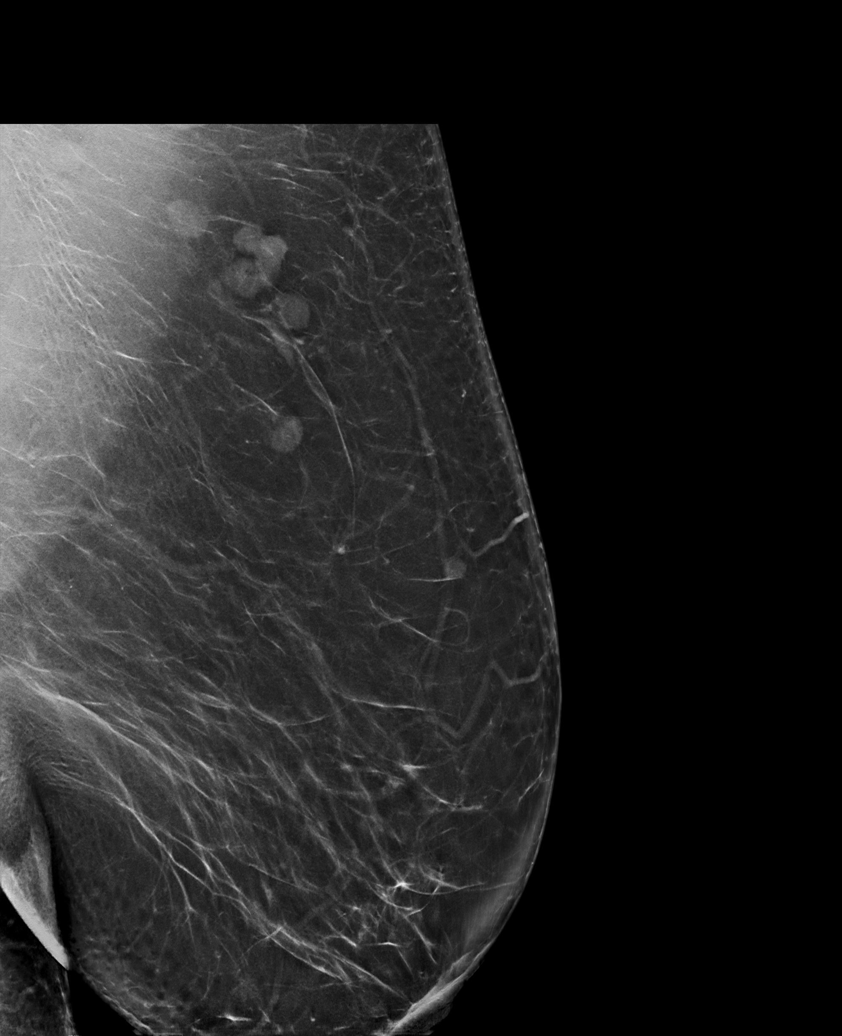

[R CC synth-2D]
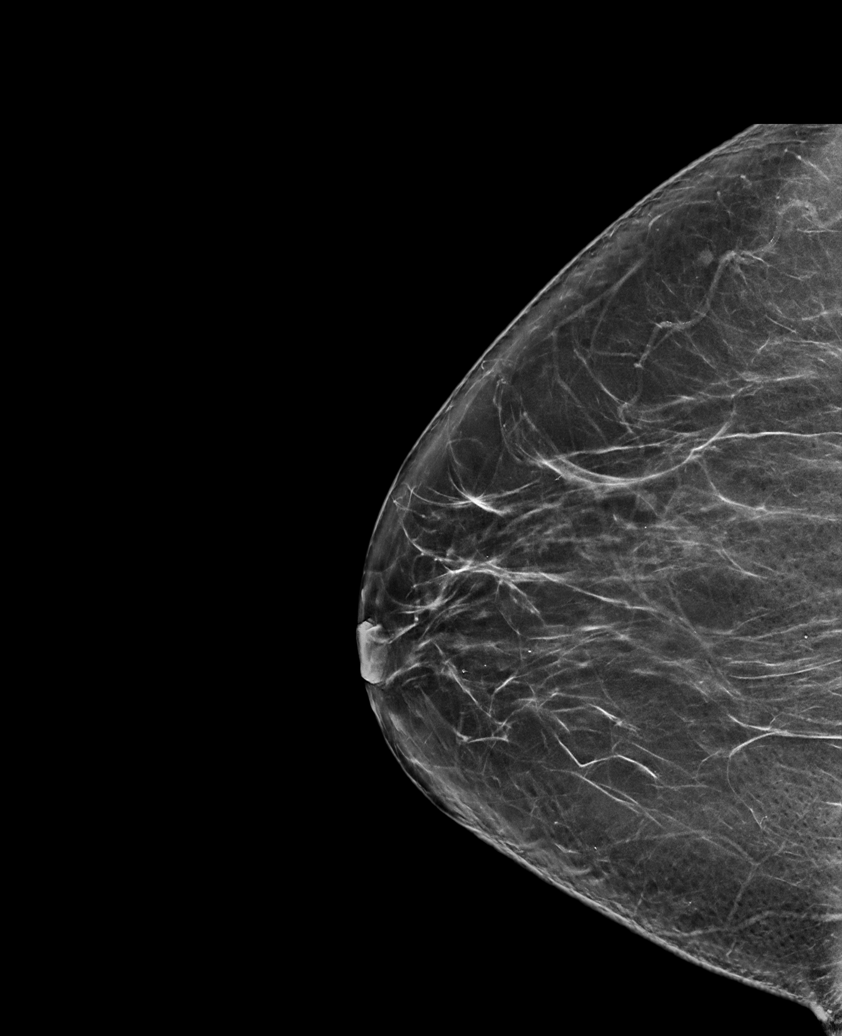

[R MLO synth-2D (1 of 2)]
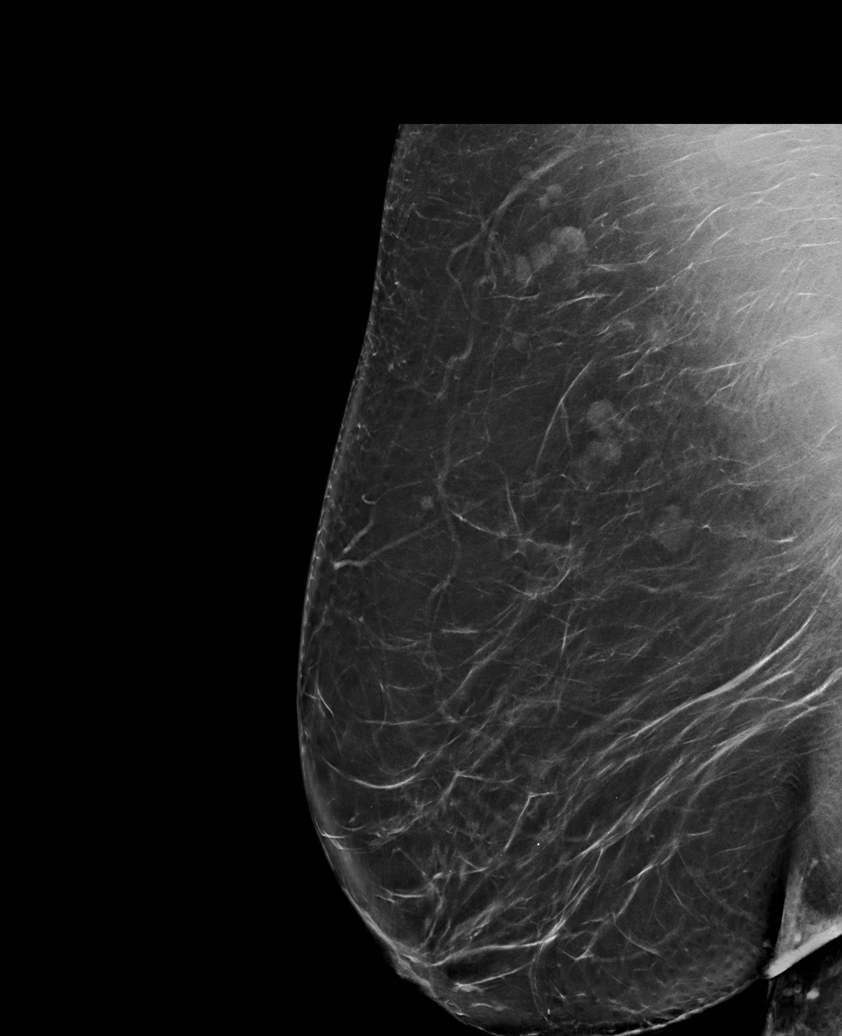

[R MLO synth-2D (2 of 2)]
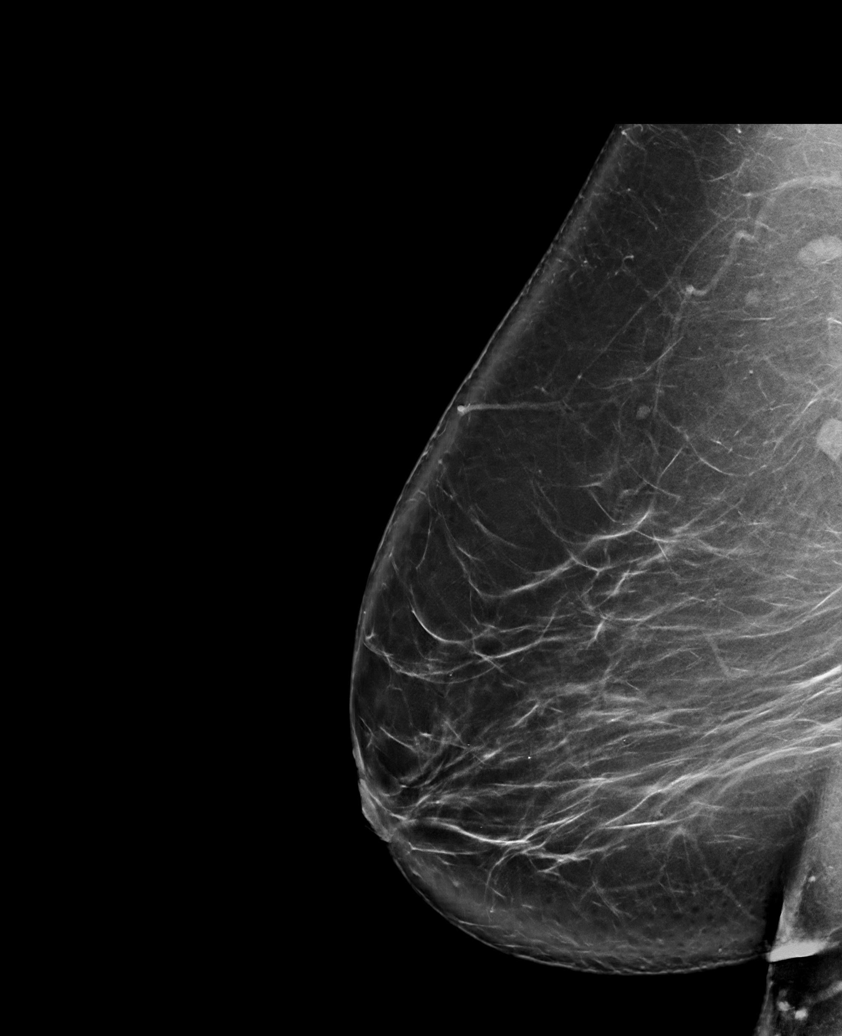

[L MLO synth-2D (2 of 2)]
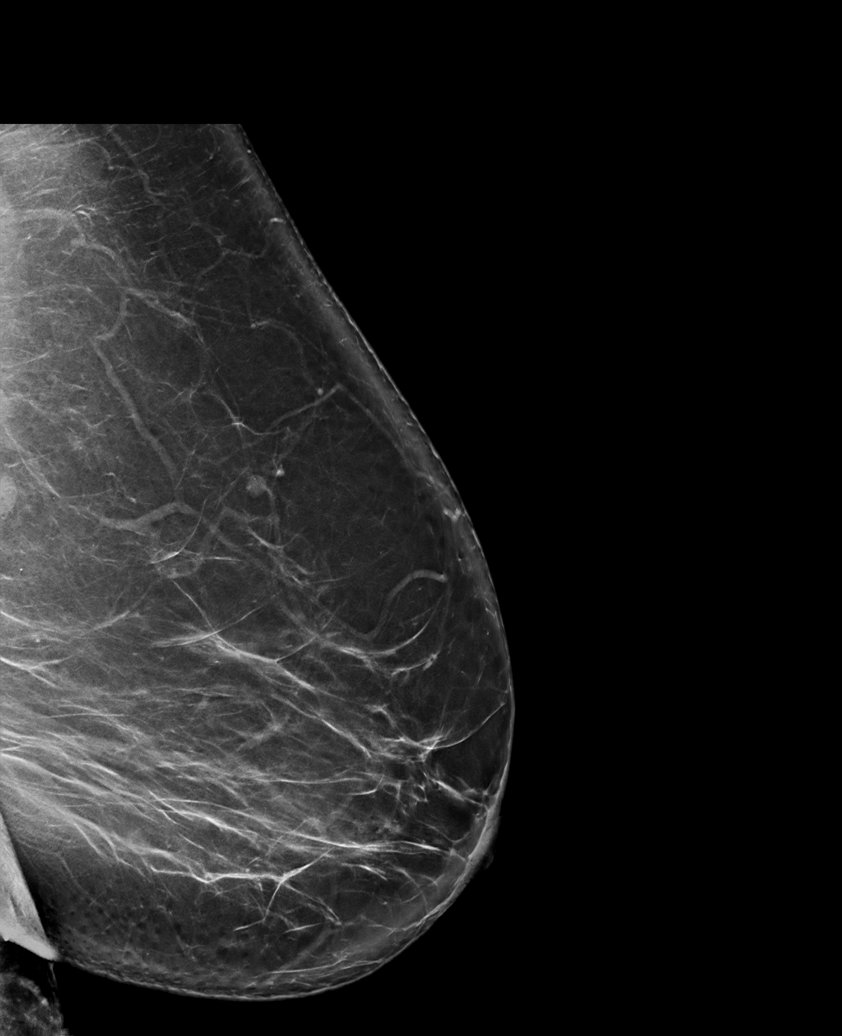

[L CC synth-2D]
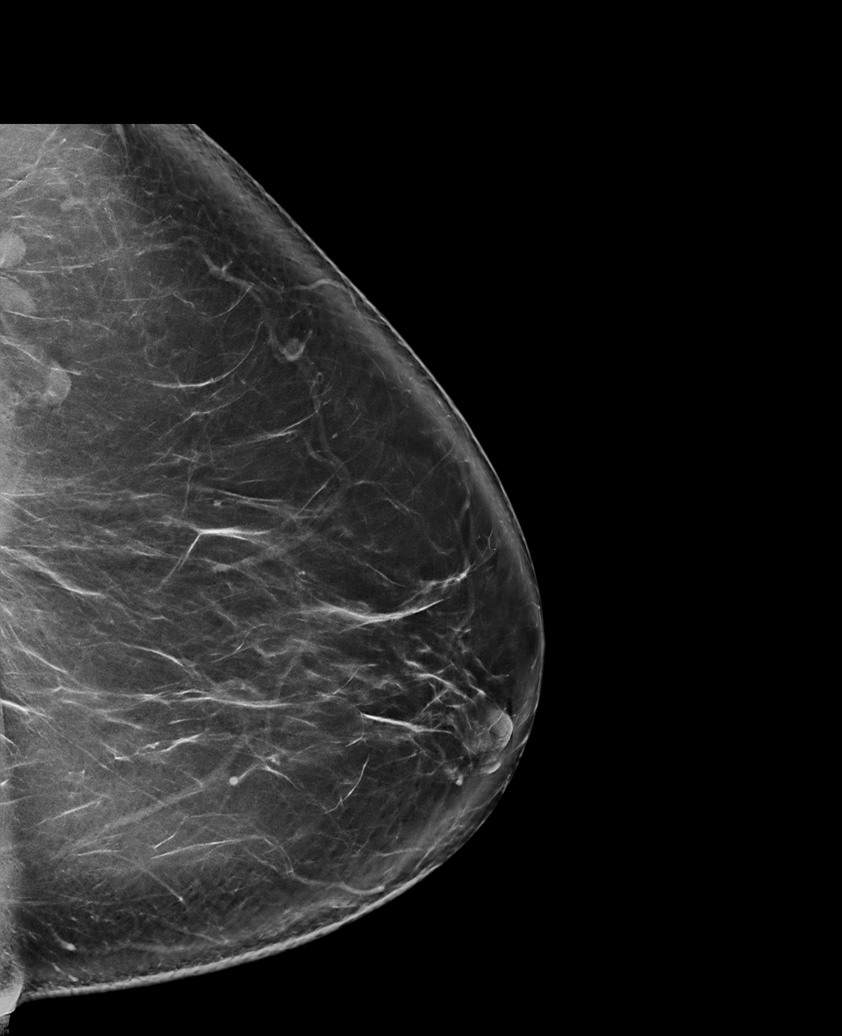

[6 of 36 positions shown; findings below may reference images not displayed]

ACR Breast Density Category b: There are scattered areas of
fibroglandular density.
FINDINGS: There are no findings suspicious for malignancy. Images were
processed with CAD.
IMPRESSION: No mammographic evidence of malignancy. A result letter of this
screening mammogram will be mailed directly to the patient.

RECOMMENDATION:
Screening mammogram in one year. (Code:[TQ])

BI-RADS CATEGORY  1: Negative.

## 2019-10-22 ENCOUNTER — Ambulatory Visit: Payer: BC Managed Care – PPO | Admitting: Obstetrics & Gynecology

## 2019-11-26 DIAGNOSIS — I10 Essential (primary) hypertension: Secondary | ICD-10-CM | POA: Diagnosis not present

## 2019-11-26 DIAGNOSIS — G4700 Insomnia, unspecified: Secondary | ICD-10-CM | POA: Diagnosis not present

## 2019-11-26 DIAGNOSIS — E669 Obesity, unspecified: Secondary | ICD-10-CM | POA: Diagnosis not present

## 2019-11-26 DIAGNOSIS — Z130 Encounter for screening for diseases of the blood and blood-forming organs and certain disorders involving the immune mechanism: Secondary | ICD-10-CM | POA: Diagnosis not present

## 2020-02-22 DIAGNOSIS — Z0189 Encounter for other specified special examinations: Secondary | ICD-10-CM | POA: Diagnosis not present

## 2020-02-22 DIAGNOSIS — E1140 Type 2 diabetes mellitus with diabetic neuropathy, unspecified: Secondary | ICD-10-CM | POA: Diagnosis not present

## 2020-02-22 DIAGNOSIS — I10 Essential (primary) hypertension: Secondary | ICD-10-CM | POA: Diagnosis not present

## 2020-02-22 DIAGNOSIS — E785 Hyperlipidemia, unspecified: Secondary | ICD-10-CM | POA: Diagnosis not present

## 2020-03-22 ENCOUNTER — Other Ambulatory Visit: Payer: Self-pay

## 2020-03-22 ENCOUNTER — Encounter (HOSPITAL_COMMUNITY): Payer: Self-pay | Admitting: Emergency Medicine

## 2020-03-22 DIAGNOSIS — I10 Essential (primary) hypertension: Secondary | ICD-10-CM | POA: Insufficient documentation

## 2020-03-22 DIAGNOSIS — E119 Type 2 diabetes mellitus without complications: Secondary | ICD-10-CM | POA: Diagnosis not present

## 2020-03-22 DIAGNOSIS — M542 Cervicalgia: Secondary | ICD-10-CM | POA: Insufficient documentation

## 2020-03-22 DIAGNOSIS — R519 Headache, unspecified: Secondary | ICD-10-CM | POA: Insufficient documentation

## 2020-03-22 DIAGNOSIS — W19XXXA Unspecified fall, initial encounter: Secondary | ICD-10-CM | POA: Insufficient documentation

## 2020-03-22 DIAGNOSIS — Z79899 Other long term (current) drug therapy: Secondary | ICD-10-CM | POA: Insufficient documentation

## 2020-03-22 DIAGNOSIS — R42 Dizziness and giddiness: Secondary | ICD-10-CM | POA: Diagnosis not present

## 2020-03-22 DIAGNOSIS — Z794 Long term (current) use of insulin: Secondary | ICD-10-CM | POA: Diagnosis not present

## 2020-03-22 NOTE — ED Triage Notes (Signed)
Patient states she was at work when she tripped over a patient's cords and fell. Patient states when she fell she hit the back of her head on a dresser. Patient ambulatory, complaining of neck "strain."

## 2020-03-23 ENCOUNTER — Emergency Department (HOSPITAL_COMMUNITY)
Admission: EM | Admit: 2020-03-23 | Discharge: 2020-03-23 | Disposition: A | Discharge status: Home / Self Care | Payer: No Typology Code available for payment source | Attending: Emergency Medicine | Admitting: Emergency Medicine

## 2020-03-23 ENCOUNTER — Emergency Department (HOSPITAL_COMMUNITY): Payer: No Typology Code available for payment source

## 2020-03-23 DIAGNOSIS — W19XXXA Unspecified fall, initial encounter: Secondary | ICD-10-CM

## 2020-03-23 DIAGNOSIS — M542 Cervicalgia: Secondary | ICD-10-CM

## 2020-03-23 IMAGING — CT CT HEAD W/O CM
3 series · 14 of 47 positions shown, 16 images · non-contrast
Comparison: None.

CLINICAL DATA: Head trauma, fall last night

EXAM:
CT HEAD WITHOUT CONTRAST
CT CERVICAL SPINE WITHOUT CONTRAST
TECHNIQUE: Multidetector CT imaging of the head and cervical spine was
performed following the standard protocol without intravenous
contrast. Multiplanar CT image reconstructions of the cervical spine
were also generated.

[Series 3: head wo · axial · 0.50mm/px · z∈[-120,+20]mm · 8 of 34 slices shown, 10 images]
[im 3/34  brain]
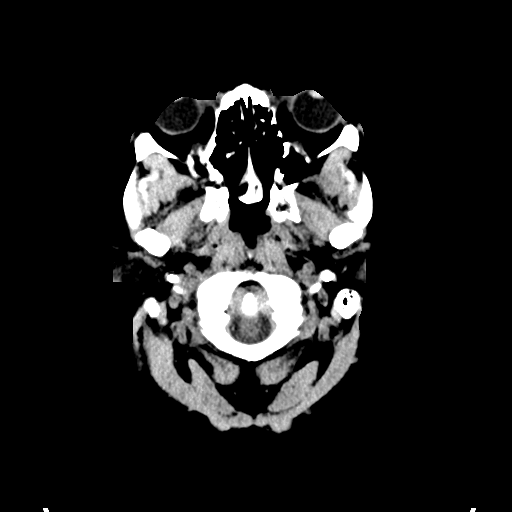
[im 3/34  bone]
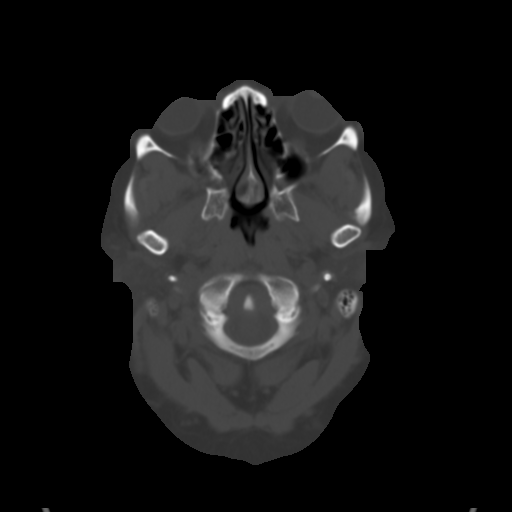
[im 7/34  brain]
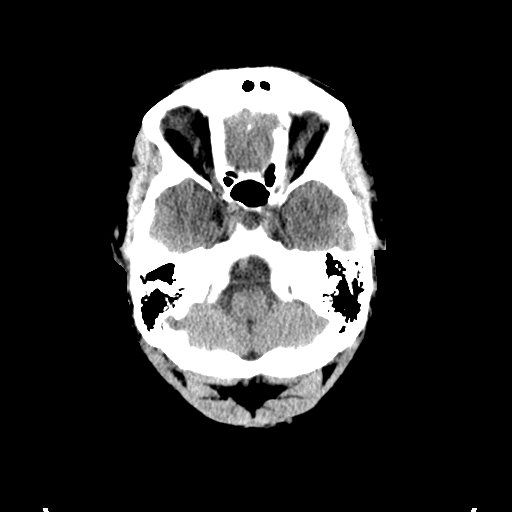
[im 11/34  brain]
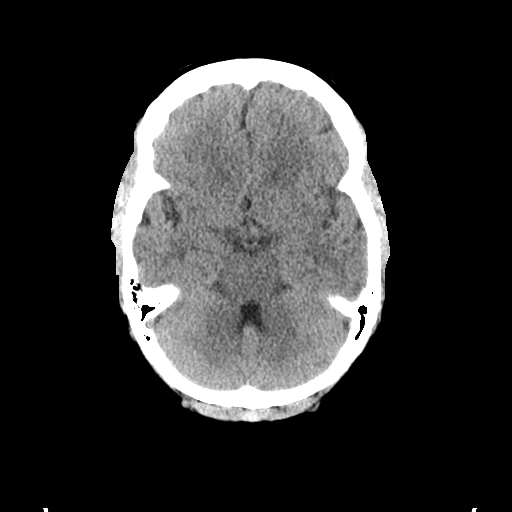
[im 15/34  brain]
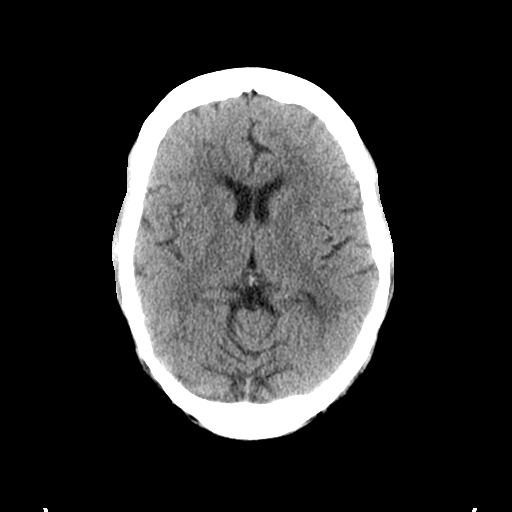
[im 19/34  brain]
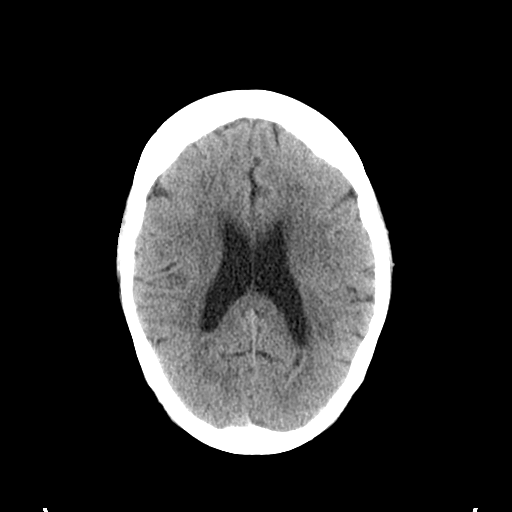
[im 19/34  bone]
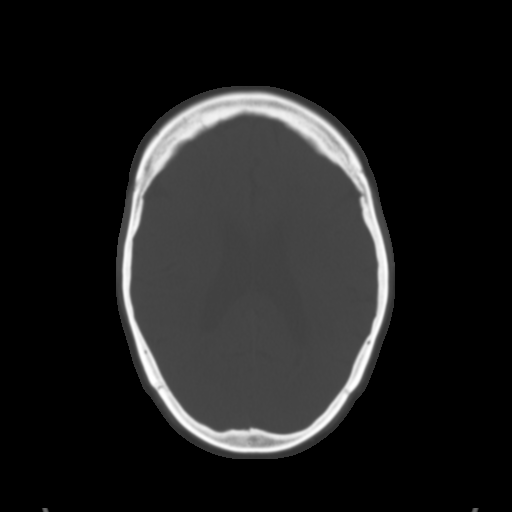
[im 23/34  brain]
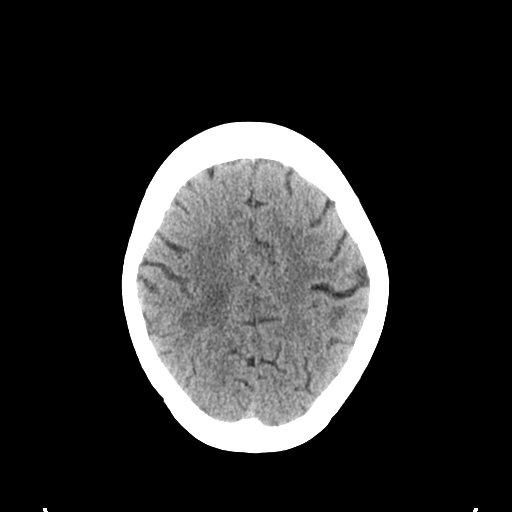
[im 27/34  brain]
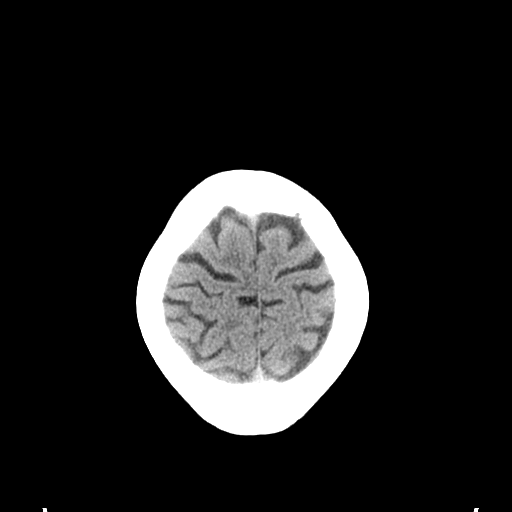
[im 31/34  brain]
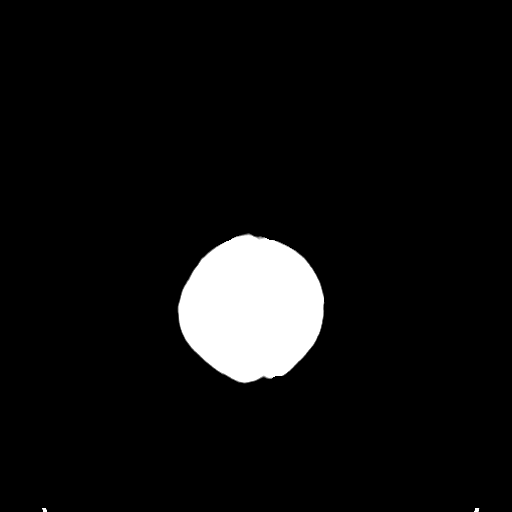

[Series 6: coronal soft tissue · coronal · 0.36mm/px · 3 of 69 slices shown]
[im 23/69  brain]
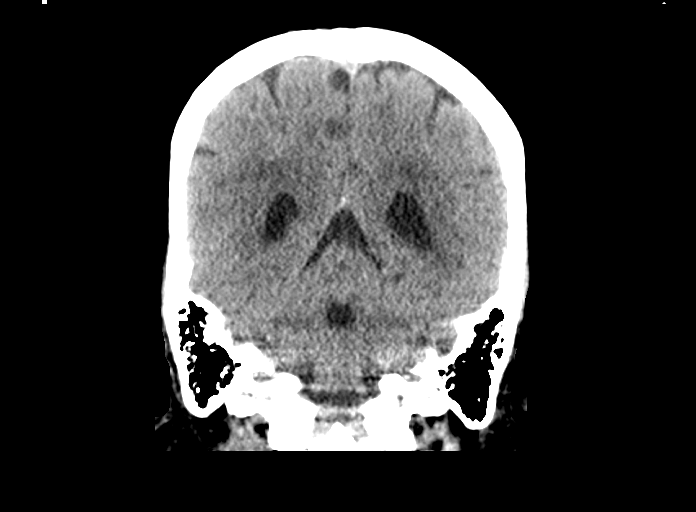
[im 31/69  brain]
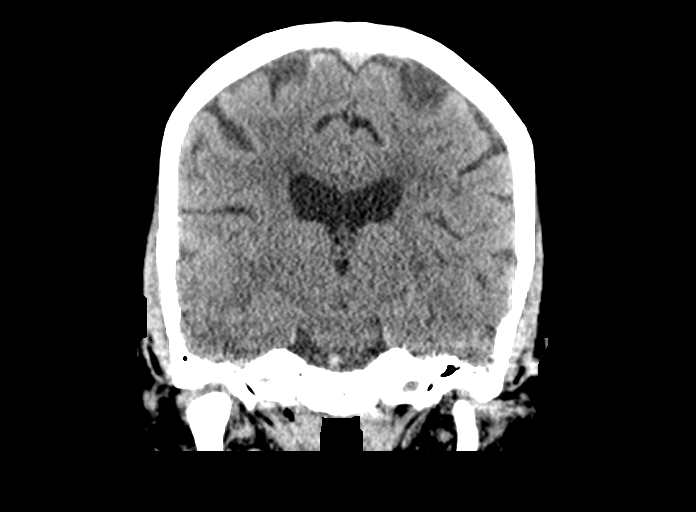
[im 38/69  brain]
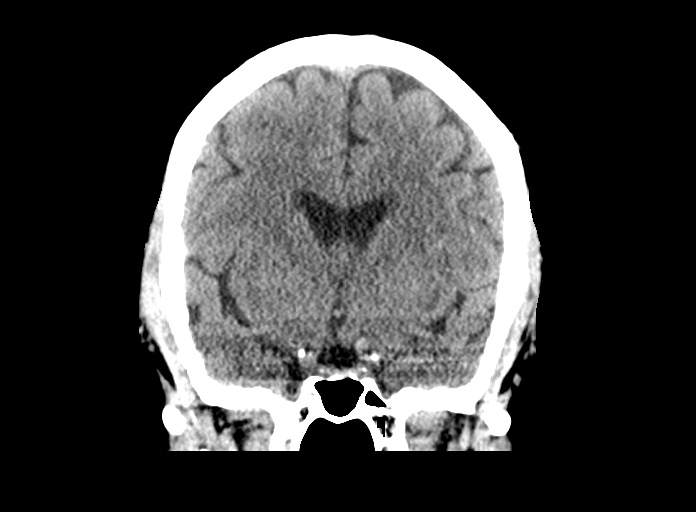

[Series 7: sagittal soft tissue · sagittal · 0.37mm/px · 3 of 52 slices shown]
[im 18/52  brain]
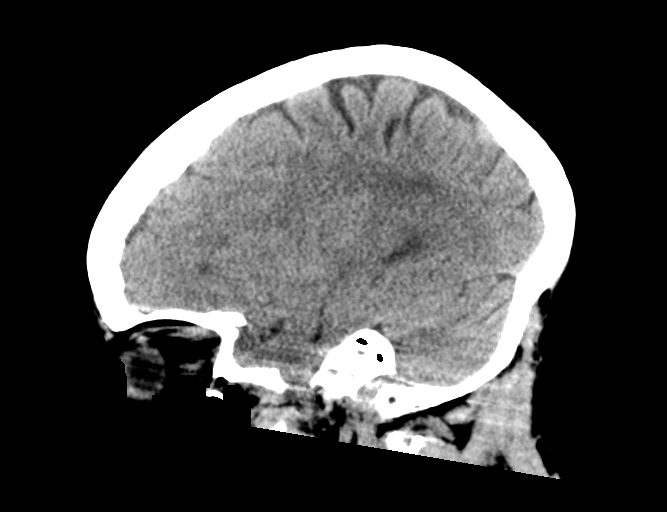
[im 26/52  brain]
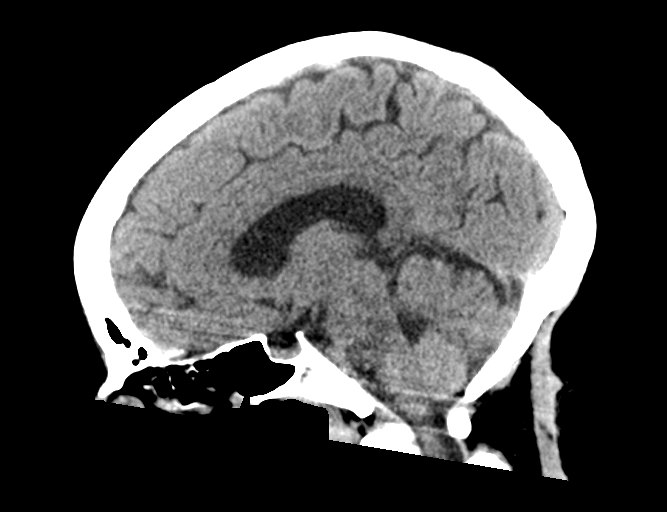
[im 35/52  brain]
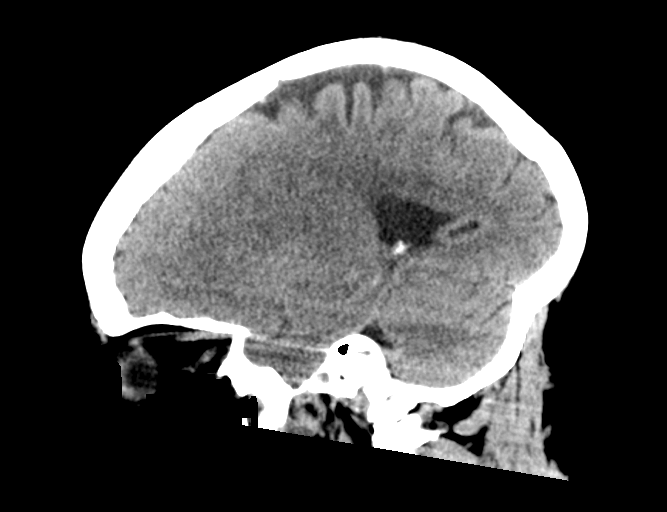

[14 of 47 positions shown; findings below may reference images not displayed]

FINDINGS: CT HEAD FINDINGS

Brain: No evidence of acute infarction, hemorrhage, hydrocephalus,
extra-axial collection or mass lesion/mass effect. Periventricular
and deep white matter hypodensity.

Vascular: No hyperdense vessel or unexpected calcification.

Skull: Normal. Negative for fracture or focal lesion.

Sinuses/Orbits: No acute finding.

Other: None.

CT CERVICAL SPINE FINDINGS

Alignment: Positional and degenerative straightening of the normal
cervical lordosis

Skull base and vertebrae: No acute fracture. No primary bone lesion
or focal pathologic process.

Soft tissues and spinal canal: No prevertebral fluid or swelling. No
visible canal hematoma.

Disc levels:  Intact.

Upper chest: Negative.

Other: None.
IMPRESSION: 1. No acute intracranial pathology. Small-vessel white matter
disease.
2. No fracture or static subluxation of the cervical spine.

## 2020-03-23 IMAGING — CT CT CERVICAL SPINE W/O CM
3 of 4 series · 10 of 33 positions shown, 12 images · non-contrast
Comparison: None.

CLINICAL DATA: Head trauma, fall last night

EXAM:
CT HEAD WITHOUT CONTRAST
CT CERVICAL SPINE WITHOUT CONTRAST
TECHNIQUE: Multidetector CT imaging of the head and cervical spine was
performed following the standard protocol without intravenous
contrast. Multiplanar CT image reconstructions of the cervical spine
were also generated.

[Series 6: orthogonal bone · axial · 0.23mm/px · z∈[-260,-171]mm · 2 of 111 slices shown, 3 images]
[im 32/111  soft-tissue]
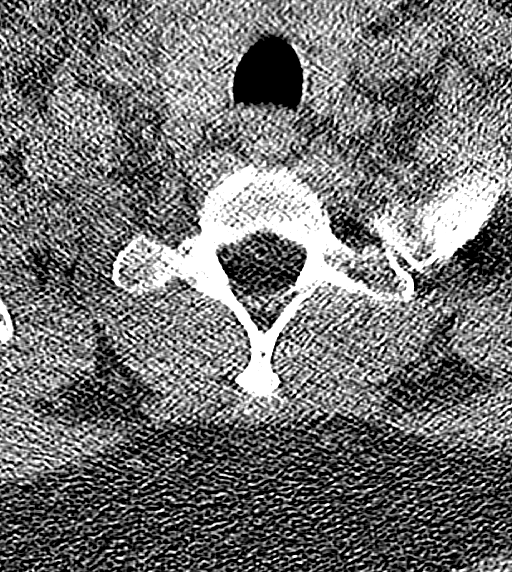
[im 32/111  bone]
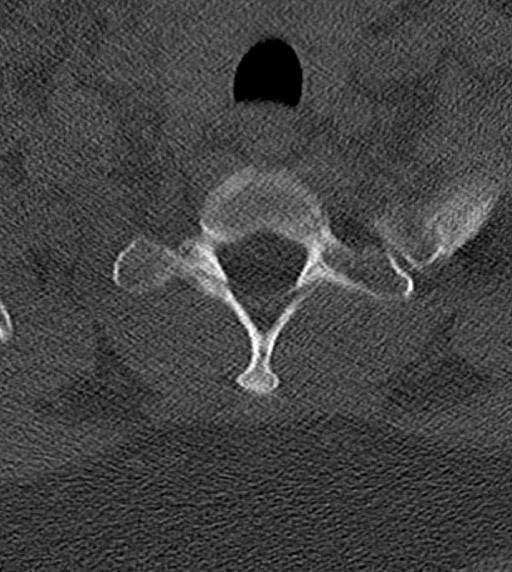
[im 79/111  bone]
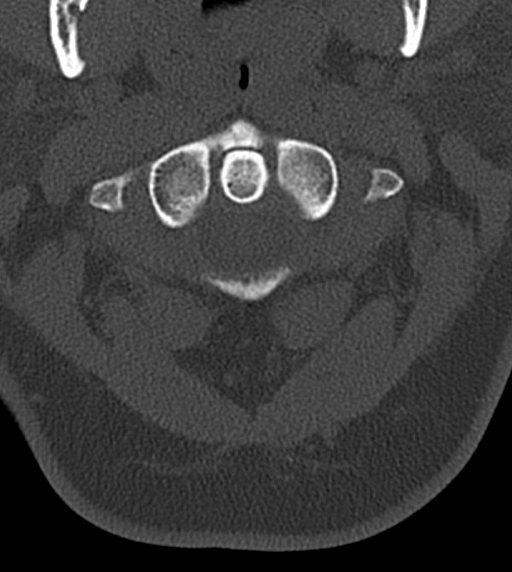

[Series 7: coronal bone · coronal · 0.27mm/px · 3 of 66 slices shown]
[im 14/66  bone]
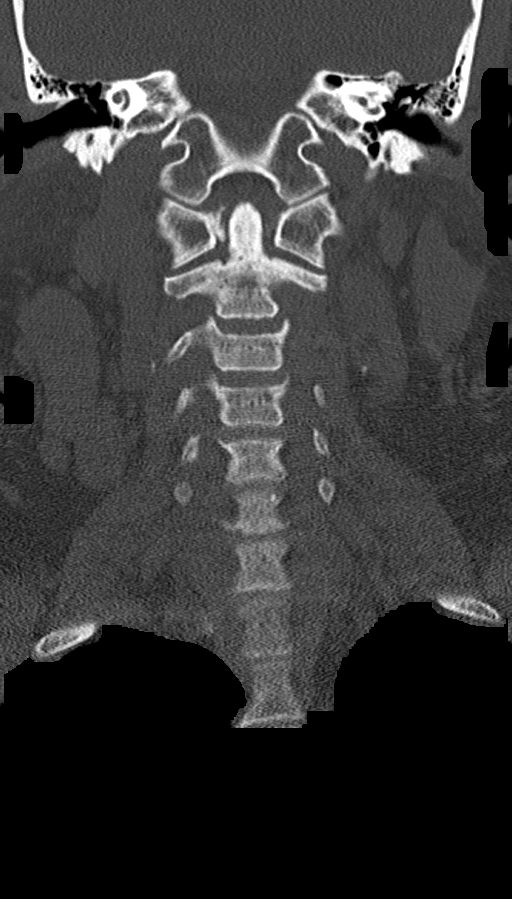
[im 27/66  bone]
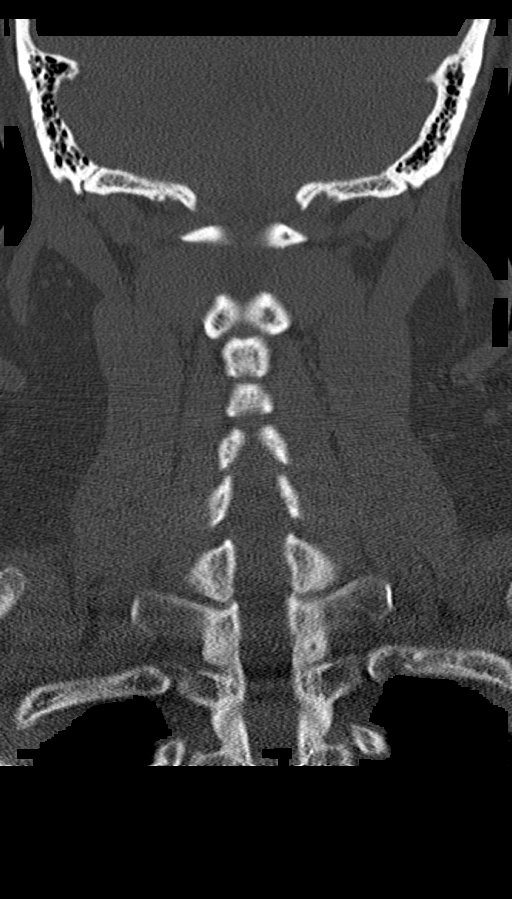
[im 40/66  bone]
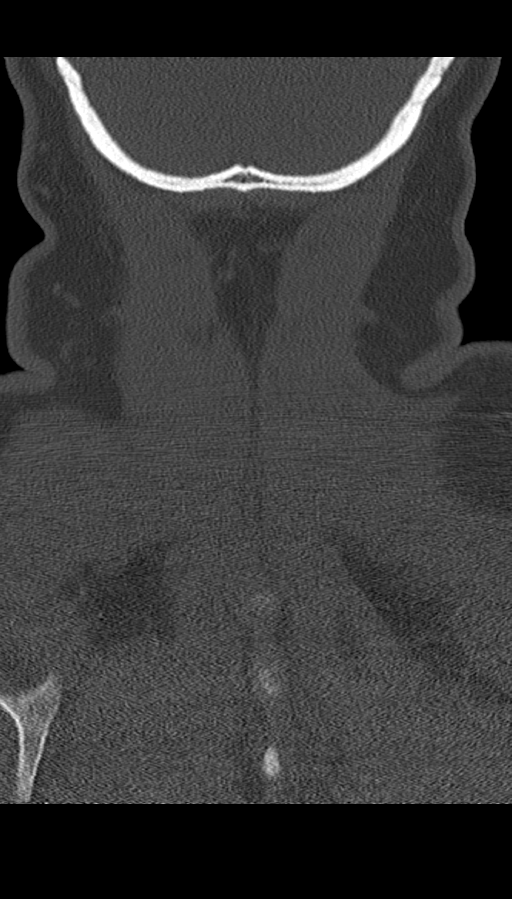

[Series 8: sagittal bone · sagittal · 0.27mm/px · 5 of 61 slices shown, 6 images]
[im 21/61  bone]
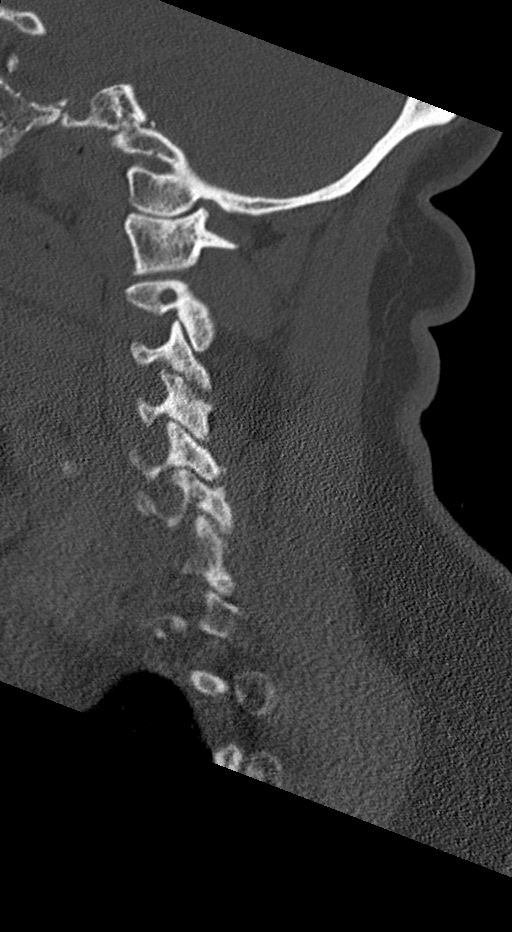
[im 26/61  bone]
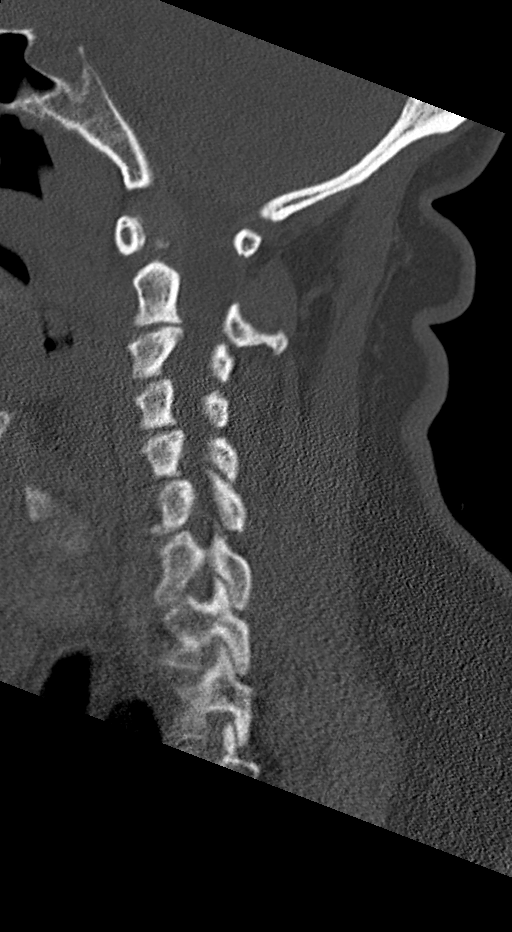
[im 31/61  soft-tissue]
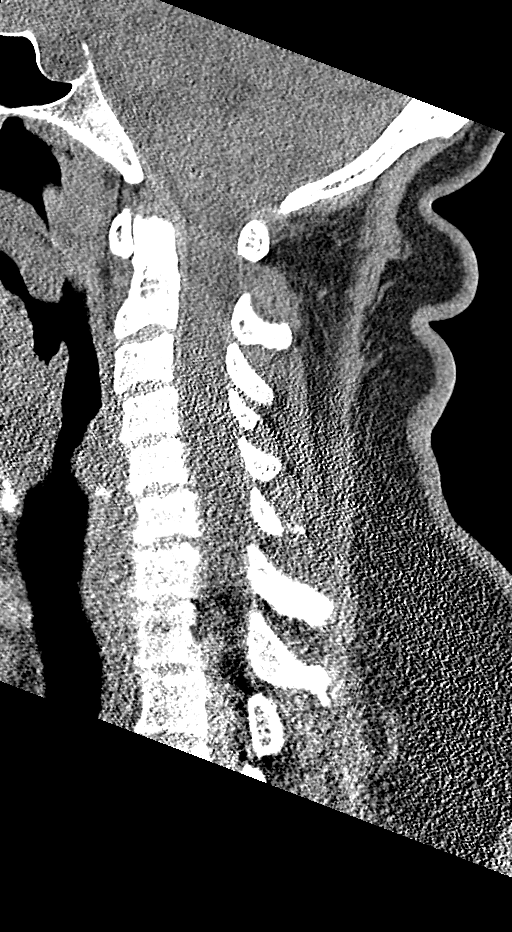
[im 31/61  bone]
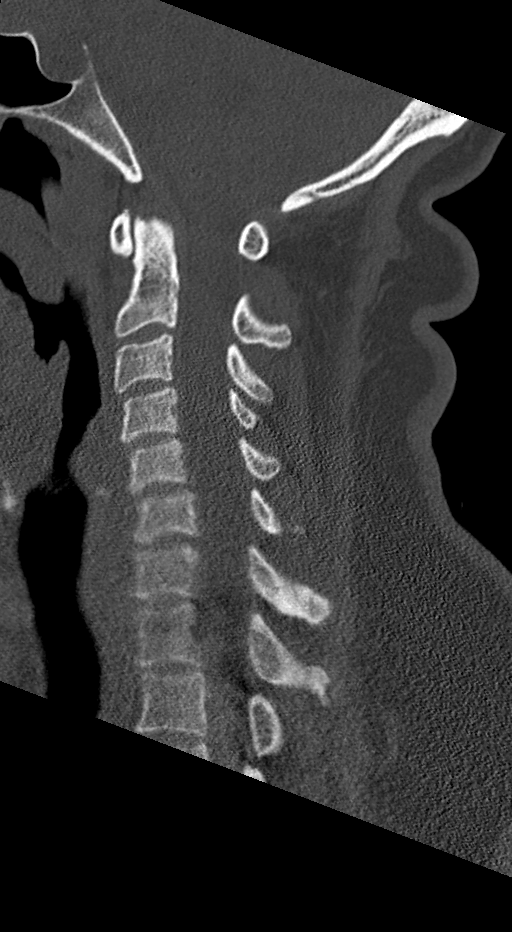
[im 36/61  bone]
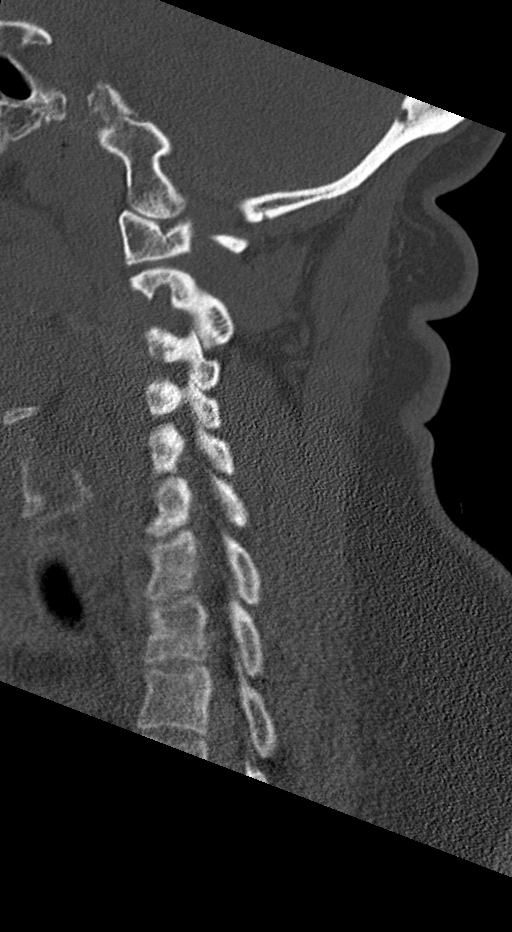
[im 41/61  bone]
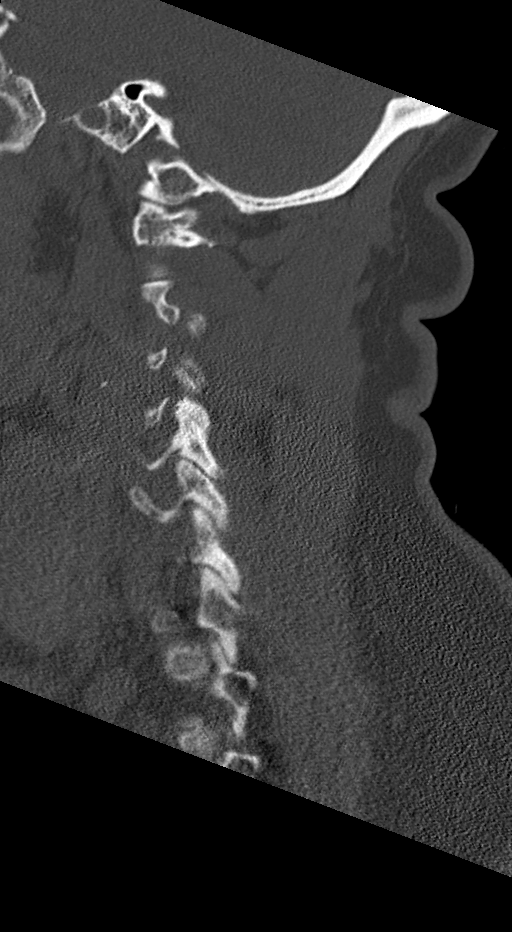

[10 of 33 positions shown; findings below may reference images not displayed]

FINDINGS: CT HEAD FINDINGS

Brain: No evidence of acute infarction, hemorrhage, hydrocephalus,
extra-axial collection or mass lesion/mass effect. Periventricular
and deep white matter hypodensity.

Vascular: No hyperdense vessel or unexpected calcification.

Skull: Normal. Negative for fracture or focal lesion.

Sinuses/Orbits: No acute finding.

Other: None.

CT CERVICAL SPINE FINDINGS

Alignment: Positional and degenerative straightening of the normal
cervical lordosis

Skull base and vertebrae: No acute fracture. No primary bone lesion
or focal pathologic process.

Soft tissues and spinal canal: No prevertebral fluid or swelling. No
visible canal hematoma.

Disc levels:  Intact.

Upper chest: Negative.

Other: None.
IMPRESSION: 1. No acute intracranial pathology. Small-vessel white matter
disease.
2. No fracture or static subluxation of the cervical spine.

## 2020-03-23 MED ORDER — NAPROXEN 375 MG PO TABS: 375.0000 mg | ORAL_TABLET | Freq: Two times a day (BID) | ORAL | 0 refills | Status: AC

## 2020-03-23 MED ORDER — METHOCARBAMOL 500 MG PO TABS: 500.0000 mg | ORAL_TABLET | Freq: Two times a day (BID) | ORAL | 0 refills | Status: DC

## 2020-03-23 NOTE — ED Provider Notes (Signed)
[facility] DEPT Provider Note   CSN: [id number] Arrival date & time: 03/22/20  2113     History Chief Complaint  Patient presents with  . Fall  . Neck Pain    Shannon Oliver is a 59 y.o. female who presents to the ED today with complaint of headache and neck pain s/p mechanical fall that occurred last night around 8 PM. 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 She attempted to catch herself on the pt's bed however missed and fell backwards hitting the back of her head onto a dresser. She states this caused her neck to whiplash forward. She fell to the ground and was dazed for a couple of seconds. She is unsure if she lost consciousness or not. She reports she has been having a headache, dizziness, and neck pain since then. She has been taking Tylenol and Ibuprofen without relief. Pt is not on blood thinners. She denies any worsening blurry vision (has blurry vision from losing her glasses), double vision, nausea, vomiting, weakness, numbness, tingling, or any other associated symptoms.   The history is provided by the patient and medical records.       Past Medical History:  Diagnosis Date  . Abnormal EKG    LVH with strain  . Acid reflux   . Depression   . Diabetes mellitus    A1C over 9  . HLD (hyperlipidemia)   . Hypertension   . Noncompliance   . Obesity     Patient Active Problem List   Diagnosis Date Noted  . Postmenopausal bleeding 06/05/2017  . Neck pain 03/01/2014  . Edema 01/25/2013  . DM (diabetes mellitus) ([place]) 01/25/2013  . Dyspnea 01/25/2013  . Abnormal ECG 01/25/2013  . Hypertension   . Acid reflux     Past Surgical History:  Procedure Laterality Date  . COLONOSCOPY WITH PROPOFOL N/A 04/29/2015   Procedure: COLONOSCOPY WITH PROPOFOL;  Surgeon: Garlan Fair, MD;  Location: [facility];  Service: Endoscopy;  Laterality: N/A;  . HYSTEROSCOPY WITH D & C N/A 09/07/2017    Procedure: DILATATION AND CURETTAGE /HYSTEROSCOPY;  Surgeon: Sloan Leiter, MD;  Location: [facility];  Service: Gynecology;  Laterality: N/A;  . KNEE ARTHROSCOPY W/ MENISCAL REPAIR Right      OB History    Gravida  3   Para  3   Term  3   Preterm      AB      Living  3     SAB      TAB      Ectopic      Multiple      Live Births  3           Family History  Problem Relation Age of Onset  . Diabetes Neg Hx     Social History   Tobacco Use  . Smoking status: Never Smoker  . Smokeless tobacco: Never Used  Vaping Use  . Vaping Use: Never used  Substance Use Topics  . Alcohol use: Not Currently    Comment: occ  . Drug use: No    Home Medications Prior to Admission medications   Medication Sig Start Date End Date Taking? Authorizing Provider  acetaminophen (TYLENOL) 500 MG tablet Take 500-1,000 mg by mouth every 6 (six) hours as needed for mild pain.   Yes [provider]  amLODipine (NORVASC) 10 MG tablet TAKE 1 TABLET EVERY DAY  ONCE A DAY ORALLY 90 Patient taking differently: Take 10 mg by mouth daily.  03/13/18  Yes Elayne Snare, MD  glimepiride (AMARYL) 2 MG tablet Take 2 mg by mouth daily before breakfast.   Yes [provider]  losartan-hydrochlorothiazide (HYZAAR) 100-25 MG per tablet Take 1 tablet by mouth every morning.    Yes [provider]  metFORMIN (GLUCOPHAGE) 1000 MG tablet Take 1,000 mg by mouth 2 (two) times daily. 03/18/20  Yes [provider]  metoprolol succinate (TOPROL-XL) 100 MG 24 hr tablet Take 100 mg by mouth daily.  09/14/17  Yes [provider]  Multiple Vitamins-Calcium (ONE-A-DAY WOMENS PO) Take 1 tablet by mouth daily.   Yes [provider]  NOVOLIN 70/30 FLEXPEN (70-30) 100 UNIT/ML KwikPen Inject 25 Units into the skin in the morning and at bedtime. 03/18/20  Yes [provider]  simvastatin (ZOCOR) 20 MG tablet Take 20 mg by mouth daily at 6 PM.  07/28/17   Yes [provider]  tiZANidine (ZANAFLEX) 2 MG tablet Take 2 mg by mouth every 12 (twelve) hours as needed for muscle pain. 02/22/20  Yes [provider]  Continuous Blood Gluc Sensor (FREESTYLE LIBRE 14 DAY SENSOR) MISC 1 Units by Does not apply route every 14 (fourteen) days. Apply 1 sensor to body once every 14 days for blood sugar monitoring. 09/01/18   Elayne Snare, MD  glucose blood (ACCU-CHEK GUIDE) test strip Use as instructed to check blood sugar one time daily. 11/16/17   Elayne Snare, MD  Insulin Syringe-Needle U-100 30G X 1/2" 0.3 ML MISC Use twice a day with insulin 10/17/17   Elayne Snare, MD  methocarbamol (ROBAXIN) 500 MG tablet Take 1 tablet (500 mg total) by mouth 2 (two) times daily. 03/23/20   Alroy Bailiff, Jammy Plotkin, PA-C  naproxen (NAPROSYN) 375 MG tablet Take 1 tablet (375 mg total) by mouth 2 (two) times daily with a meal for 10 days. 03/23/20 04/02/20  Eustaquio Maize, PA-C    Allergies    Hydrocodone  Review of Systems   Review of Systems  Constitutional: Negative for chills and fever.  Eyes: Negative for visual disturbance.  Gastrointestinal: Negative for nausea and vomiting.  Musculoskeletal: Positive for neck pain.  Neurological: Positive for headaches.  All other systems reviewed and are negative.   Physical Exam Updated Vital Signs BP (!) 162/98 (BP Location: Right Arm)   Pulse 68   Temp 98 F (36.7 C) (Oral)   Resp 16   Ht 5\' 6"  (1.676 m)   LMP 08/02/2013 Comment: spotting  SpO2 94%   BMI 41.16 kg/m   Physical Exam Vitals and nursing note reviewed.  Constitutional:      Appearance: She is not ill-appearing or diaphoretic.  HENT:     Head: Normocephalic.     Comments: Hematoma to right occiput area with TTP; no palpable deformity appreciated No raccoon's sign or battle's sign. Negative hemotympanum bilaterally.    Right Ear: Tympanic membrane normal.     Left Ear: Tympanic membrane normal.  Eyes:     Conjunctiva/sclera: Conjunctivae  normal.  Neck:     Comments: + midline C spine TTP Cardiovascular:     Rate and Rhythm: Normal rate and regular rhythm.     Pulses: Normal pulses.  Pulmonary:     Effort: Pulmonary effort is normal.     Breath sounds: Normal breath sounds. No wheezing, rhonchi or rales.  Abdominal:     Palpations: Abdomen is soft.     Tenderness:  There is no guarding or rebound.  Musculoskeletal:     Cervical back: Neck supple. Tenderness present.     Comments: No T or L midline spinal TTP. No tenderness to remainder of BUE and BLEs. Strength and sensation intact.   Skin:    General: Skin is warm and dry.  Neurological:     Mental Status: She is alert.     Comments: CN 3-12 grossly intact A&O x4 GCS 15 Sensation and strength intact Gait nonataxic including with tandem walking Coordination with finger-to-nose WNL Neg romberg, neg pronator drift     ED Results / Procedures / Treatments   Labs (all labs ordered are listed, but only abnormal results are displayed) Labs Reviewed - No data to display  EKG None  Radiology CT Head Wo Contrast  Result Date: 03/23/2020 CLINICAL DATA:  Head trauma, fall last night EXAM: CT HEAD WITHOUT CONTRAST CT CERVICAL SPINE WITHOUT CONTRAST TECHNIQUE: Multidetector CT imaging of the head and cervical spine was performed following the standard protocol without intravenous contrast. Multiplanar CT image reconstructions of the cervical spine were also generated. COMPARISON:  None. FINDINGS: CT HEAD FINDINGS Brain: No evidence of acute infarction, hemorrhage, hydrocephalus, extra-axial collection or mass lesion/mass effect. Periventricular and deep white matter hypodensity. Vascular: No hyperdense vessel or unexpected calcification. Skull: Normal. Negative for fracture or focal lesion. Sinuses/Orbits: No acute finding. Other: None. CT CERVICAL SPINE FINDINGS Alignment: Positional and degenerative straightening of the normal cervical lordosis Skull base and vertebrae: No  acute fracture. No primary bone lesion or focal pathologic process. Soft tissues and spinal canal: No prevertebral fluid or swelling. No visible canal hematoma. Disc levels:  Intact. Upper chest: Negative. Other: None. IMPRESSION: 1. No acute intracranial pathology. Small-vessel white matter disease. 2. No fracture or static subluxation of the cervical spine. Electronically Signed   By: Eddie Candle M.D.   On: 03/23/2020 11:49   CT Cervical Spine Wo Contrast  Result Date: 03/23/2020 CLINICAL DATA:  Head trauma, fall last night EXAM: CT HEAD WITHOUT CONTRAST CT CERVICAL SPINE WITHOUT CONTRAST TECHNIQUE: Multidetector CT imaging of the head and cervical spine was performed following the standard protocol without intravenous contrast. Multiplanar CT image reconstructions of the cervical spine were also generated. COMPARISON:  None. FINDINGS: CT HEAD FINDINGS Brain: No evidence of acute infarction, hemorrhage, hydrocephalus, extra-axial collection or mass lesion/mass effect. Periventricular and deep white matter hypodensity. Vascular: No hyperdense vessel or unexpected calcification. Skull: Normal. Negative for fracture or focal lesion. Sinuses/Orbits: No acute finding. Other: None. CT CERVICAL SPINE FINDINGS Alignment: Positional and degenerative straightening of the normal cervical lordosis Skull base and vertebrae: No acute fracture. No primary bone lesion or focal pathologic process. Soft tissues and spinal canal: No prevertebral fluid or swelling. No visible canal hematoma. Disc levels:  Intact. Upper chest: Negative. Other: None. IMPRESSION: 1. No acute intracranial pathology. Small-vessel white matter disease. 2. No fracture or static subluxation of the cervical spine. Electronically Signed   By: Eddie Candle M.D.   On: 03/23/2020 11:49    Procedures Procedures (including critical care time)  Medications Ordered in ED Medications - No data to display  ED Course  I have reviewed the triage vital  signs and the nursing notes.  Pertinent labs & imaging results that were available during my care of the patient were reviewed by me and considered in my medical decision making (see chart for details).    MDM Rules/Calculators/A&P  59 year old female who presents to the ED today after tripping over patient's cords at the nursing facility yesterday causing her to fall backwards and hit her head on a dresser.  Is complaining of a headache and neck pain.  She is not anticoagulated.  She denies any nausea, vomiting, blurry vision, double vision, weakness, numbness, tingling.  She has no focal neuro deficits on exam today however she reports feeling dizzy.  She has midline spinal tenderness on exam.  We will plan for CT head and CT C-spine.  If no acute findings will plan to discharge with muscle relaxer.   CTs without acute findings today. Will plan to discharge at this time. Will provide muscle relaxer and antiinflammatory. Pt to follow up with PCP.   This note was prepared using Dragon voice recognition software and may include unintentional dictation errors due to the inherent limitations of voice recognition software.  Final Clinical Impression(s) / ED Diagnoses Final diagnoses:  Fall, initial encounter  Neck pain    Rx / DC Orders ED Discharge Orders         Ordered    methocarbamol (ROBAXIN) 500 MG tablet  2 times daily        03/23/20 1221    naproxen (NAPROSYN) 375 MG tablet  2 times daily with meals        03/23/20 1221           Discharge Instructions     Your images were reassuring today. There were no signs of fractures or brain bleeds. Please pick up medications and take as prescribed. DO NOT DRIVE WHILE ON THE MUSCLE RELAXER AS IT CAN MAKE YOU DROWSY.   Follow up with your PCP regarding your ED visit today  Return to the ED for any worsening symptoms       Eustaquio Maize, PA-C 03/23/20 1222    Pattricia Boss, MD 03/23/20 1255

## 2020-03-23 NOTE — Discharge Instructions (Signed)
Your images were reassuring today. There were no signs of fractures or brain bleeds. Please pick up medications and take as prescribed. DO NOT DRIVE WHILE ON THE MUSCLE RELAXER AS IT CAN MAKE YOU DROWSY.   Follow up with your PCP regarding your ED visit today  Return to the ED for any worsening symptoms

## 2020-03-23 NOTE — ED Notes (Signed)
I called patient to recheck vitals and no one responded 

## 2020-04-16 ENCOUNTER — Ambulatory Visit: Payer: Worker's Compensation

## 2020-04-16 ENCOUNTER — Other Ambulatory Visit: Payer: Self-pay | Admitting: Family Medicine

## 2020-04-16 ENCOUNTER — Other Ambulatory Visit: Payer: Self-pay

## 2020-04-16 DIAGNOSIS — M25512 Pain in left shoulder: Secondary | ICD-10-CM

## 2020-04-16 IMAGING — DX DG SHOULDER 2+V*L*
3 series · 3 of 3 positions shown · non-contrast
Comparison: [DATE].

CLINICAL DATA: Left shoulder pain after fall last month.

EXAM:
LEFT SHOULDER - 2+ VIEW

[shoulder ap]
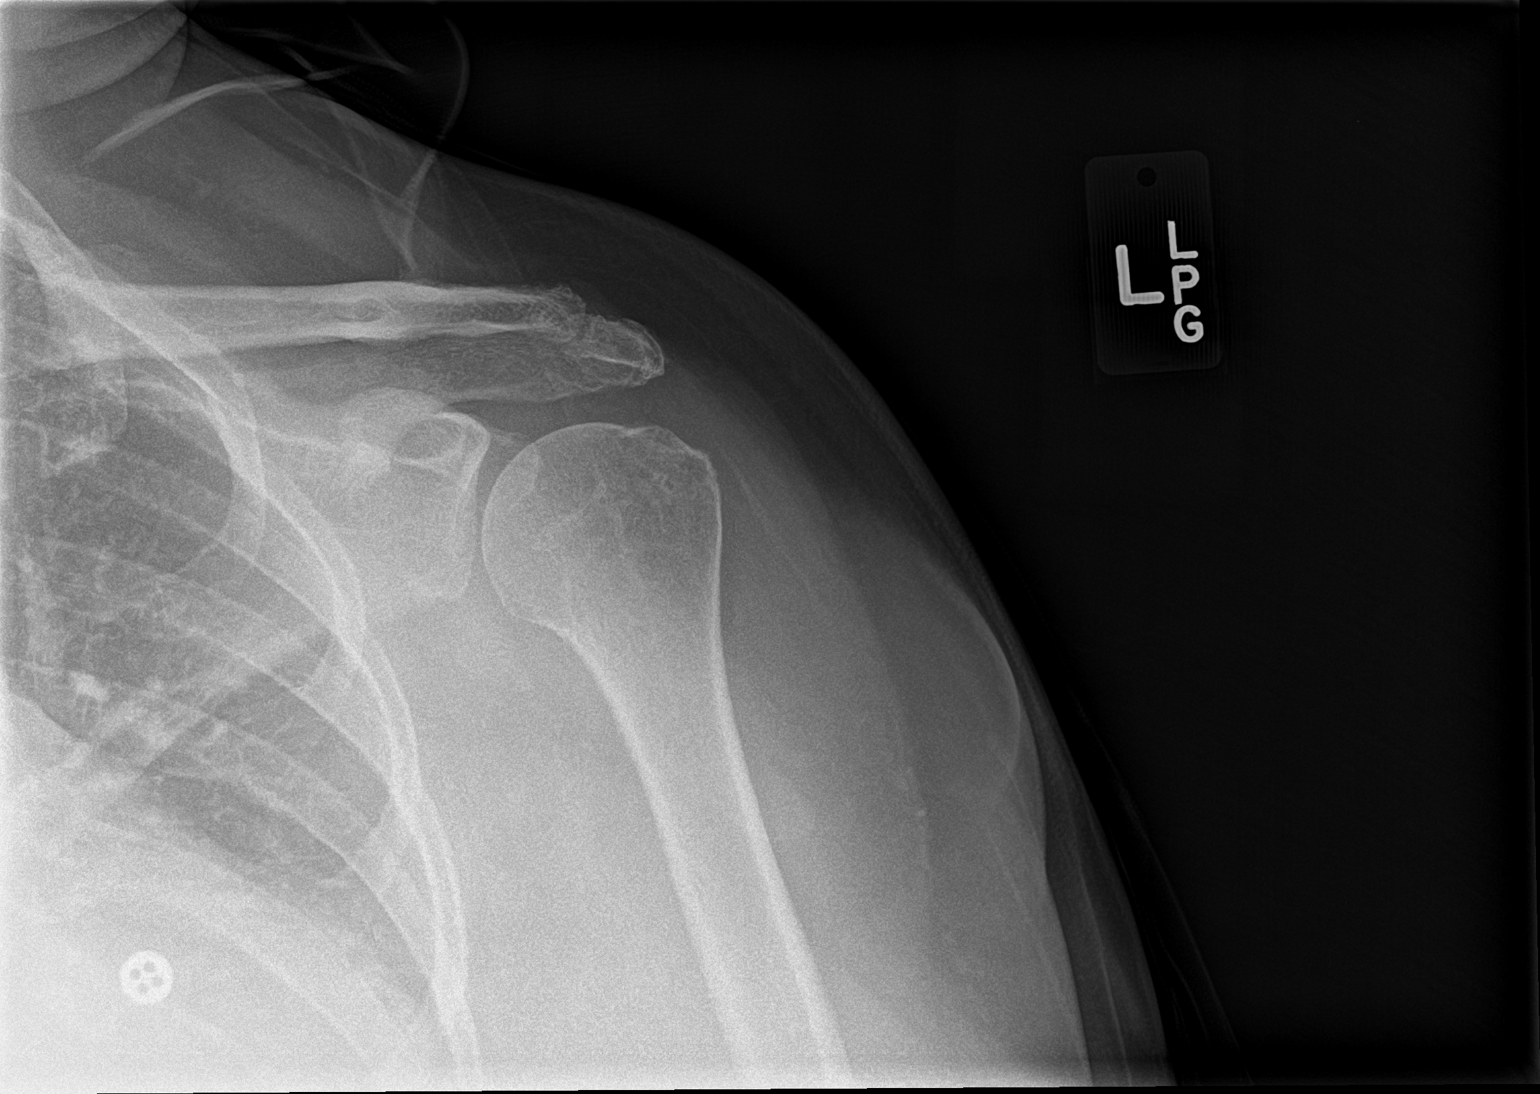

[shoulder y-view]
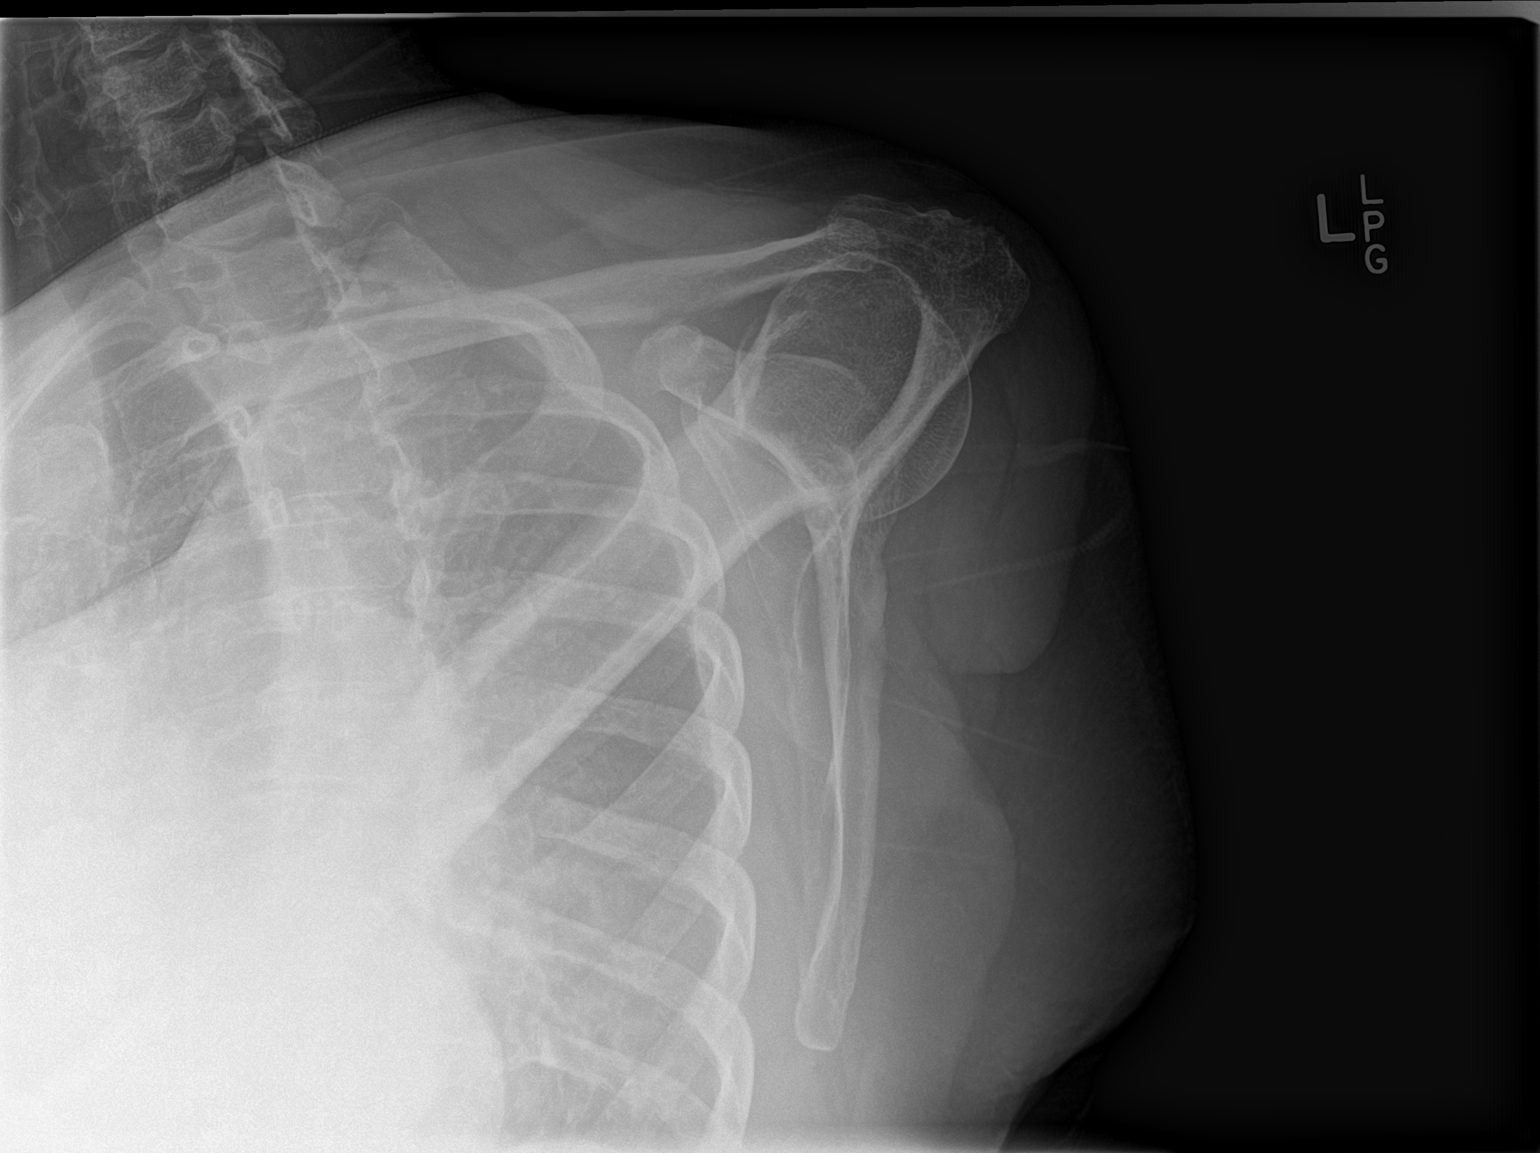

[shoulder axial]
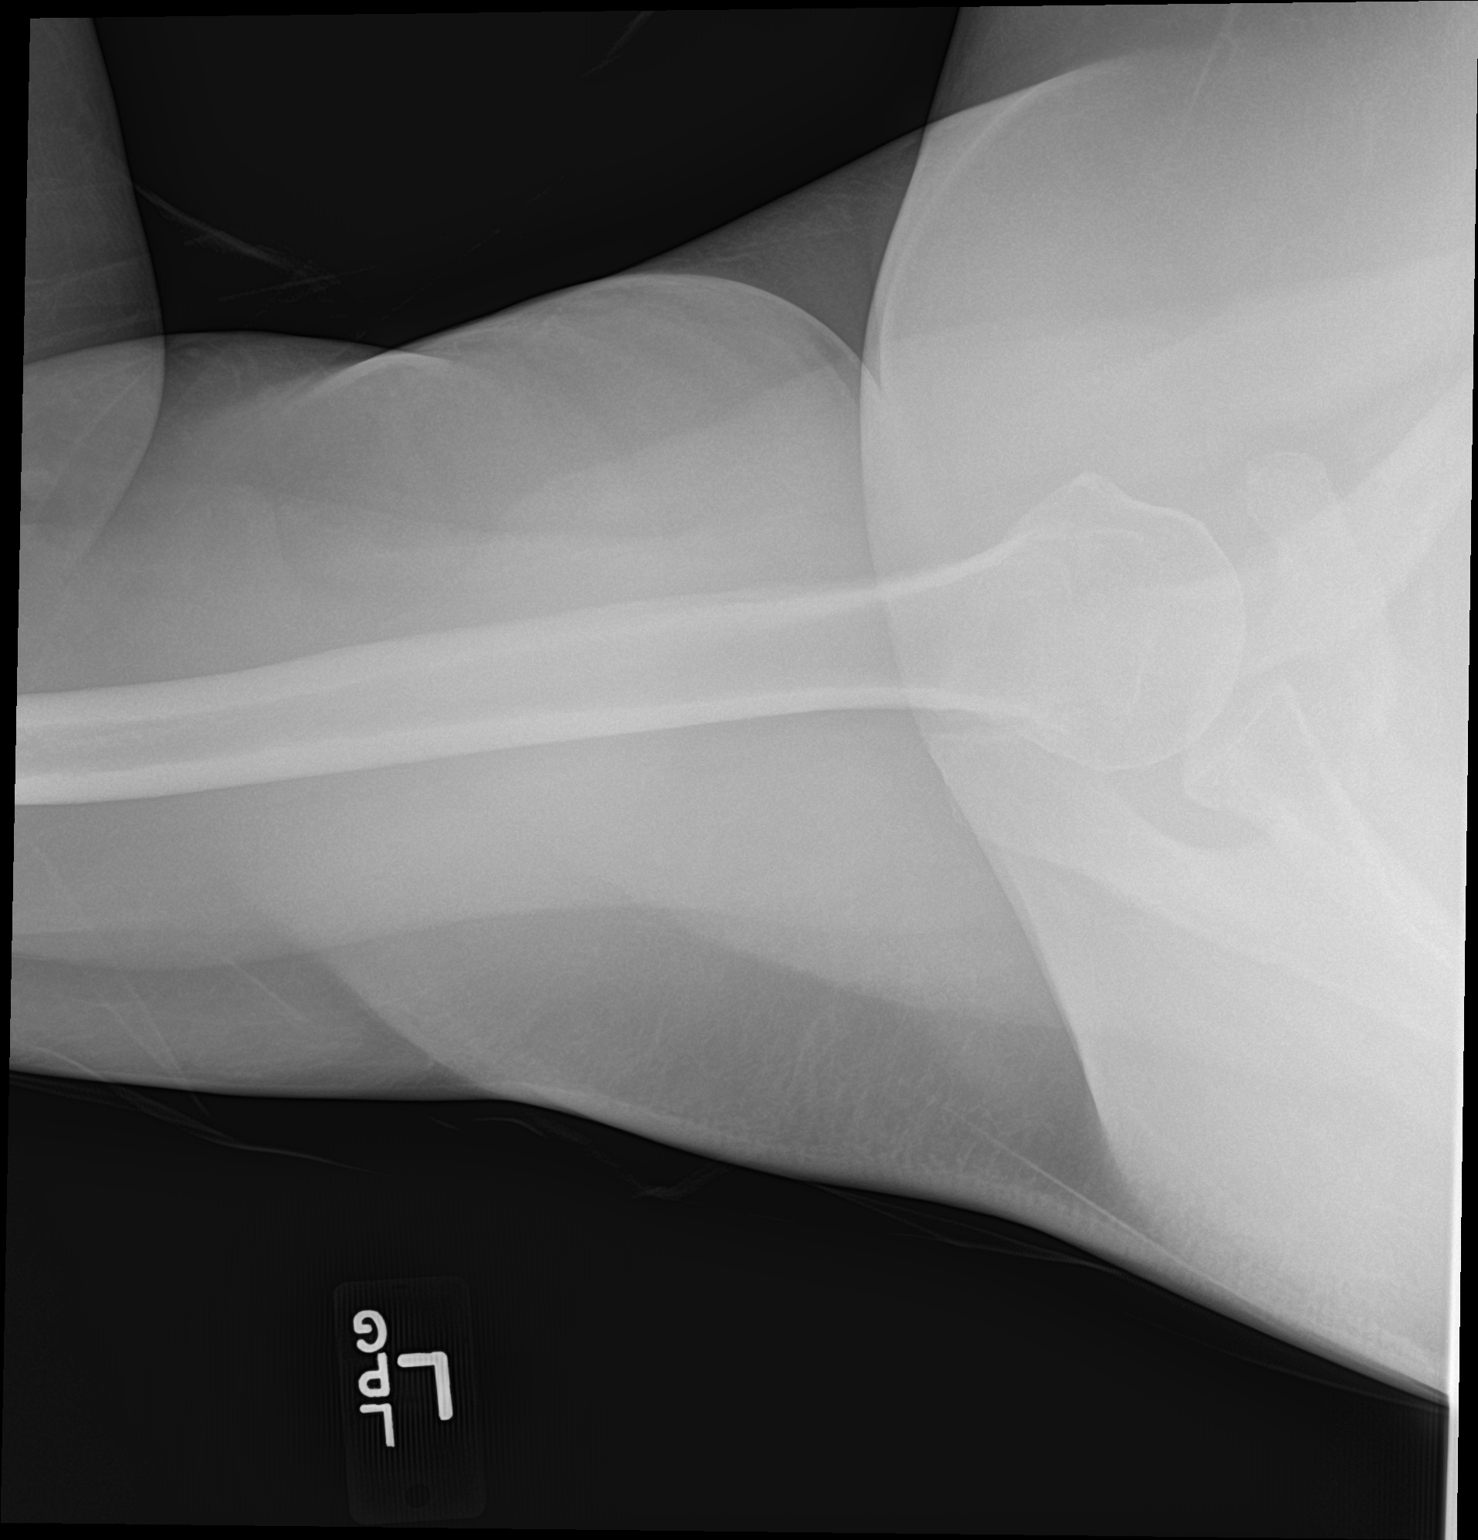

[3 of 3 positions shown; findings below may reference images not displayed]

FINDINGS: There is no evidence of fracture or dislocation. Mild degenerative
changes seen involving the left acromioclavicular joint. Soft
tissues are unremarkable.
IMPRESSION: Mild degenerative joint disease of the left acromioclavicular joint.
No acute abnormality is noted.

## 2020-05-14 DIAGNOSIS — M25512 Pain in left shoulder: Secondary | ICD-10-CM | POA: Insufficient documentation

## 2020-08-01 ENCOUNTER — Encounter (HOSPITAL_COMMUNITY): Payer: Self-pay | Admitting: Emergency Medicine

## 2020-08-19 DIAGNOSIS — Z4789 Encounter for other orthopedic aftercare: Secondary | ICD-10-CM | POA: Insufficient documentation

## 2020-09-04 ENCOUNTER — Other Ambulatory Visit: Payer: Self-pay

## 2020-09-05 ENCOUNTER — Other Ambulatory Visit (INDEPENDENT_AMBULATORY_CARE_PROVIDER_SITE_OTHER): Payer: BC Managed Care – PPO

## 2020-09-05 ENCOUNTER — Other Ambulatory Visit: Payer: Self-pay | Admitting: Endocrinology

## 2020-09-05 DIAGNOSIS — E1165 Type 2 diabetes mellitus with hyperglycemia: Secondary | ICD-10-CM

## 2020-09-05 DIAGNOSIS — Z794 Long term (current) use of insulin: Secondary | ICD-10-CM

## 2020-09-05 LAB — HEMOGLOBIN A1C: Hgb A1c MFr Bld: 10.3 % — ABNORMAL HIGH (ref 4.6–6.5)

## 2020-09-05 LAB — COMPREHENSIVE METABOLIC PANEL
ALT: 20 U/L (ref 0–35)
AST: 21 U/L (ref 0–37)
Albumin: 4 g/dL (ref 3.5–5.2)
Alkaline Phosphatase: 80 U/L (ref 39–117)
BUN: 14 mg/dL (ref 6–23)
CO2: 28 mEq/L (ref 19–32)
Calcium: 9.3 mg/dL (ref 8.4–10.5)
Chloride: 101 mEq/L (ref 96–112)
Creatinine, Ser: 0.71 mg/dL (ref 0.40–1.20)
GFR: 93.01 mL/min (ref >60.00)
Glucose, Bld: 326 mg/dL — ABNORMAL HIGH (ref 70–99)
Potassium: 3.4 mEq/L — ABNORMAL LOW (ref 3.5–5.1)
Sodium: 138 mEq/L (ref 135–145)
Total Bilirubin: 0.4 mg/dL (ref 0.2–1.2)
Total Protein: 7.6 g/dL (ref 6.0–8.3)

## 2020-09-05 LAB — MICROALBUMIN / CREATININE URINE RATIO
Creatinine,U: 147.7 mg/dL
Microalb Creat Ratio: 24.5 mg/g (ref 0.0–30.0)
Microalb, Ur: 36.2 mg/dL — ABNORMAL HIGH (ref 0.0–1.9)

## 2020-09-05 LAB — LIPID PANEL
Cholesterol: 163 mg/dL (ref 0–200)
HDL: 48 mg/dL (ref >39.00)
LDL Cholesterol: 78 mg/dL (ref 0–99)
NonHDL: 115.1
Total CHOL/HDL Ratio: 3
Triglycerides: 187 mg/dL — ABNORMAL HIGH (ref 0.0–149.0)
VLDL: 37.4 mg/dL (ref 0.0–40.0)

## 2020-09-08 ENCOUNTER — Encounter: Payer: Self-pay | Admitting: Endocrinology

## 2020-09-08 ENCOUNTER — Other Ambulatory Visit: Payer: Self-pay

## 2020-09-08 ENCOUNTER — Ambulatory Visit: Payer: BC Managed Care – PPO | Admitting: Endocrinology

## 2020-09-08 VITALS — BP 162/76 | HR 88 | Ht 66.0 in | Wt 258.0 lb

## 2020-09-08 DIAGNOSIS — E1165 Type 2 diabetes mellitus with hyperglycemia: Secondary | ICD-10-CM

## 2020-09-08 DIAGNOSIS — E876 Hypokalemia: Secondary | ICD-10-CM

## 2020-09-08 DIAGNOSIS — I10 Essential (primary) hypertension: Secondary | ICD-10-CM | POA: Diagnosis not present

## 2020-09-08 DIAGNOSIS — Z794 Long term (current) use of insulin: Secondary | ICD-10-CM | POA: Diagnosis not present

## 2020-09-08 MED ORDER — DEXCOM G6 SENSOR MISC: 3 refills | Status: DC

## 2020-09-08 MED ORDER — DAPAGLIFLOZIN PROPANEDIOL 5 MG PO TABS: 5.0000 mg | ORAL_TABLET | Freq: Every day | ORAL | 1 refills | Status: DC

## 2020-09-08 MED ORDER — POTASSIUM CHLORIDE CRYS ER 20 MEQ PO TBCR: 20.0000 meq | EXTENDED_RELEASE_TABLET | Freq: Every day | ORAL | 1 refills | Status: DC

## 2020-09-08 MED ORDER — DEXCOM G6 TRANSMITTER MISC: 1.0000 | Freq: Once | 1 refills | Status: AC

## 2020-09-08 NOTE — Patient Instructions (Addendum)
Check blood sugars on waking up 7 days a week  Also check blood sugars about 2 hours after meals and do this after different meals by rotation  Recommended blood sugar levels on waking up are 90-130 and about 2 hours after meal is 130-160  Please bring your blood sugar monitor to each visit, thank you  Insulin 40 in am and 30 at supper, adjust up/down 4-5 units if sugar stays > 200 or < 100  Stop Glimeperide  Check BP daily

## 2020-09-08 NOTE — Progress Notes (Signed)
Patient ID: Shannon Oliver, female   DOB: 1961/04/22, 60 y.o.   MRN: [id number]          Reason for Appointment: Follow-up for Type 2 Diabetes  Referring physician: Dr. Fara Olden   History of Present Illness:          Date of diagnosis of type 2 diabetes mellitus: 2008       Background history:   She thinks she has been on mostly oral hypoglycemic drugs notably metformin and Amaryl for most of the duration of her diabetes She was probably started on insulin in 2018 with Levemir insulin when her A1c was 9.6 She has not had an endocrinology follow-up since 03/2017  Recent history:   INSULIN regimen is:  Novolin 70/30, 25 units am and 8 pm  Non-insulin hypoglycemic drugs the patient is taking are: Metformin 1000mg  bid, Amaryl 2 mg in a.m.  She has not been seen in follow-up since 1/20  A1c is now 10.3  Current management, blood sugar patterns and problems identified:  She has minimal blood sugar monitoring at home and did not bring her meter  She was being followed elsewhere and she thinks she was able to get the Dexcom at some point  She thinks her A1c was over 14 last year and she had been not taking insulin  Now even with taking up to 50 units a day she still has high sugars, was over 300 in the lab  She may not always take her evening insulin  She was advised to see the dietitian previously but she again does not want to do this  Overall she has been inconsistent with her diet because of depression; also drinking a lot of sweet tea but recently trying to drink only diluted apple juice  Weight has not changed much over the last 2 years  Not able to be consistently active because of recent surgery on her own   Walking: 30 minutes occasionally, just starting       Side effects from medications have been: Dizziness and sharp side pains from Invokana, Rybelsus made her sick  Compliance with the medical regimen: Poor Hypoglycemia: Never  Glucose monitoring:  done  <1 times a day         Glucometer:  Accu-Chek brand     Blood Glucose readings : Does not know recent readings   Self-care: The diet that the patient has been following is: tries to limit sugar drinks.     Meal times are: Usually 2 meals a day, variable times  Typical meal intake: Breakfast is eggs and toast               Dietician visit, most recent: Never  Weight history:  Wt Readings from Last 3 Encounters:  09/08/20 258 lb (117 kg)  12/23/18 255 lb (115.7 kg)  09/28/18 254 lb 12.8 oz (115.6 kg)    Glycemic control:   Lab Results  Component Value Date   HGBA1C 10.3 (H) 09/05/2020   HGBA1C 7.8 (A) 07/19/2018   HGBA1C 10.5 (H) 03/08/2018   Lab Results  Component Value Date   MICROALBUR 36.2 (H) 09/05/2020   LDLCALC 78 09/05/2020   CREATININE 0.71 09/05/2020   Lab Results  Component Value Date   MICRALBCREAT 24.5 09/05/2020    Lab Results  Component Value Date   FRUCTOSAMINE 394 (H) 04/13/2018   FRUCTOSAMINE 361 (H) 11/14/2017   FRUCTOSAMINE 336 (H) 10/25/2016      Allergies as of 09/08/2020  Reactions   Hydrocodone Hypertension      Medication List       Accurate as of September 08, 2020  3:38 PM. If you have any questions, ask your nurse or doctor.        acetaminophen 500 MG tablet Commonly known as: TYLENOL Take 500-1,000 mg by mouth every 6 (six) hours as needed for mild pain.   amLODipine 10 MG tablet Commonly known as: NORVASC TAKE 1 TABLET EVERY DAY ONCE A DAY ORALLY 90 What changed:   how much to take  how to take this  when to take this  additional instructions   dapagliflozin propanediol 5 MG Tabs tablet Commonly known as: Farxiga Take 1 tablet (5 mg total) by mouth daily. Started by: Elayne Snare, MD   Dexcom G6 Sensor Misc Use to monitor blood sugar, change after 10 days What changed:   how much to take  how to take this  when to take this  additional instructions Changed by: Elayne Snare, MD   Dexcom G6 Transmitter  Misc 1 Device by Does not apply route once for 1 dose. Started by: Elayne Snare, MD   glimepiride 2 MG tablet Commonly known as: AMARYL Take 2 mg by mouth daily before breakfast.   glucose blood test strip Commonly known as: Accu-Chek Guide Use as instructed to check blood sugar one time daily.   Insulin Syringe-Needle U-100 30G X 1/2" 0.3 ML Misc Use twice a day with insulin   losartan-hydrochlorothiazide 100-25 MG tablet Commonly known as: HYZAAR Take 1 tablet by mouth every morning.   metFORMIN 1000 MG tablet Commonly known as: GLUCOPHAGE Take 1,000 mg by mouth 2 (two) times daily.   methocarbamol 500 MG tablet Commonly known as: ROBAXIN Take 1 tablet (500 mg total) by mouth 2 (two) times daily.   metoprolol succinate 100 MG 24 hr tablet Commonly known as: TOPROL-XL Take 100 mg by mouth daily.   NovoLIN 70/30 FlexPen (70-30) 100 UNIT/ML KwikPen Generic drug: insulin isophane & regular human Inject 25 Units into the skin in the morning and at bedtime.   ONE-A-DAY WOMENS PO Take 1 tablet by mouth daily.   potassium chloride SA 20 MEQ tablet Commonly known as: KLOR-CON Take 1 tablet (20 mEq total) by mouth daily. Started by: Elayne Snare, MD   simvastatin 20 MG tablet Commonly known as: ZOCOR Take 20 mg by mouth daily at 6 PM.   tiZANidine 2 MG tablet Commonly known as: ZANAFLEX Take 2 mg by mouth every 12 (twelve) hours as needed for muscle pain.       Allergies:  Allergies  Allergen Reactions  . Hydrocodone Hypertension    Past Medical History:  Diagnosis Date  . Abnormal EKG    LVH with strain  . Acid reflux   . Depression   . Diabetes mellitus    A1C over 9  . HLD (hyperlipidemia)   . Hypertension   . Noncompliance   . Obesity     Past Surgical History:  Procedure Laterality Date  . COLONOSCOPY WITH PROPOFOL N/A 04/29/2015   Procedure: COLONOSCOPY WITH PROPOFOL;  Surgeon: Garlan Fair, MD;  Location: [facility];  Service: Endoscopy;   Laterality: N/A;  . HYSTEROSCOPY WITH D & C N/A 09/07/2017   Procedure: DILATATION AND CURETTAGE /HYSTEROSCOPY;  Surgeon: Sloan Leiter, MD;  Location: [facility];  Service: Gynecology;  Laterality: N/A;  . KNEE ARTHROSCOPY W/ MENISCAL REPAIR Right     Family History  Problem Relation  Age of Onset  . Diabetes Neg Hx     Social History:  reports that she has never smoked. She has never used smokeless tobacco. She reports previous alcohol use. She reports that she does not use drugs.   Review of Systems  Psychiatric/Behavioral: Positive for depressed mood.     Lipid history:  Treated with Zocor 20 mg daily Her last LDL was below 100    Lab Results  Component Value Date   CHOL 163 09/05/2020   HDL 48.00 09/05/2020   LDLCALC 78 09/05/2020   TRIG 187.0 (H) 09/05/2020   CHOLHDL 3 09/05/2020           Hypertension: She is on losartan HCT, metoprolol and Norvasc from PCP Blood pressure high, out of losartan in the last couple of weeks  She has been inconsistent with her treatment regimen in the past also She does know how to check blood pressure at home  BP Readings from Last 3 Encounters:  09/08/20 (!) 162/76  03/23/20 (!) 162/98  12/23/18 128/70    Hypokalemia: Has not been taking any potassium supplements lately  Lab Results  Component Value Date   K 3.4 (L) 09/05/2020     Most recent eye exam was in 2017  Most recent foot exam: 10/2017   LABS:  Lab on 09/05/2020  Component Date Value Ref Range Status  . Cholesterol 09/05/2020 163  0 - 200 mg/dL Final   ATP III Classification       Desirable:  < 200 mg/dL               Borderline High:  200 - 239 mg/dL          High:  > = 240 mg/dL  . Triglycerides 09/05/2020 187.0* 0.0 - 149.0 mg/dL Final   Normal:  <150 mg/dLBorderline High:  150 - 199 mg/dL  . HDL 09/05/2020 48.00  >39.00 mg/dL Final  . VLDL 09/05/2020 37.4  0.0 - 40.0 mg/dL Final  . LDL Cholesterol 09/05/2020 78  0 - 99 mg/dL Final  .  Total CHOL/HDL Ratio 09/05/2020 3   Final                  Men          Women1/2 Average Risk     3.4          3.3Average Risk          5.0          4.42X Average Risk          9.6          7.13X Average Risk          15.0          11.0                      . NonHDL 09/05/2020 115.10   Final   NOTE:  Non-HDL goal should be 30 mg/dL higher than patient's LDL goal (i.e. LDL goal of < 70 mg/dL, would have non-HDL goal of < 100 mg/dL)  . Microalb, Ur 09/05/2020 36.2* 0.0 - 1.9 mg/dL Final  . Creatinine,U 09/05/2020 147.7  mg/dL Final  . Microalb Creat Ratio 09/05/2020 24.5  0.0 - 30.0 mg/g Final  . Sodium 09/05/2020 138  135 - 145 mEq/L Final  . Potassium 09/05/2020 3.4* 3.5 - 5.1 mEq/L Final  . Chloride 09/05/2020 101  96 - 112 mEq/L Final  . CO2 09/05/2020 28  19 - 32 mEq/L Final  . Glucose, Bld 09/05/2020 326* 70 - 99 mg/dL Final  . BUN 09/05/2020 14  6 - 23 mg/dL Final  . Creatinine, Ser 09/05/2020 0.71  0.40 - 1.20 mg/dL Final  . Total Bilirubin 09/05/2020 0.4  0.2 - 1.2 mg/dL Final  . Alkaline Phosphatase 09/05/2020 80  39 - 117 U/L Final  . AST 09/05/2020 21  0 - 37 U/L Final  . ALT 09/05/2020 20  0 - 35 U/L Final  . Total Protein 09/05/2020 7.6  6.0 - 8.3 g/dL Final  . Albumin 09/05/2020 4.0  3.5 - 5.2 g/dL Final  . GFR 09/05/2020 93.01  >60.00 mL/min Final   Calculated using the CKD-EPI Creatinine Equation (2021)  . Calcium 09/05/2020 9.3  8.4 - 10.5 mg/dL Final  . Hgb A1c MFr Bld 09/05/2020 10.3* 4.6 - 6.5 % Final   Glycemic Control Guidelines for People with Diabetes:Non Diabetic:  <6%Goal of Therapy: <7%Additional Action Suggested:  >8%     Physical Examination:  BP (!) 162/76   Pulse 88   Ht 5\' 6"  (1.676 m)   Wt 258 lb (117 kg)   LMP 08/02/2013 Comment: spotting  SpO2 97%   BMI 41.64 kg/m         ASSESSMENT:  Diabetes type 2, uncontrolled with morbid obesity  See history of present illness for detailed discussion of current diabetes management, blood sugar  patterns and problems identified  Her A1c is much higher at 10.5  Her blood sugars have been consistently poorly controlled especially recently This is from noncompliance with all aspects of her self-care including monitoring, taking insulin regularly, diet, lack of exercise and poor follow-up She appears to be more insulin resistant now and with up to 50 units of insulin her blood sugars are still as high as 324 in the lab Difficult to know what side effects she had from previously tried medication such as Rybelsus and Invokana Currently likely not benefiting much from Metformin and especially glipizide  Hypercholesterolemia: Controlled with Zocor   HYPERTENSION: She has been out of losartan HCTZ and blood pressure is significantly higher This is recently somewhat better with improved compliance with her medications   PLAN:    Since she thinks she has had significant side effects from Rybelsus it would not be worth trying a GLP-1 drug at least for now  She agrees to try FARXIGA 5 mg daily Discussed action of SGLT 2 drugs on lowering glucose by decreasing kidney absorption of glucose, benefits of weight loss and lower blood pressure, possible side effects including candidiasis and dosage regimen   This will help her with long-term risk reduction also  At the same time she will increase her fluid intake, observe good genital hygiene  Also discussed need to check blood pressure regularly with this  Co-pay card given  She will increase her INSULIN to 40 units in the morning and at least 30 at suppertime  To take insulin more consistently especially at dinnertime  This will be adjusted every 3 to 4 days and discussed principles of adjustment of the dose  For better blood sugar monitoring she can start back on CGM and see if Dexcom is covered, prescription sent  Also potentially may need to consider basal bolus insulin regimen separately or an insulin pump device  Stop  glimepiride and continue Metformin  Needs more regular follow-up  Start potassium supplementation with 20 mg once daily especially since she is just starting back on her HCTZ  Patient Instructions  Check blood sugars on waking up 7 days a week  Also check blood sugars about 2 hours after meals and do this after different meals by rotation  Recommended blood sugar levels on waking up are 90-130 and about 2 hours after meal is 130-160  Please bring your blood sugar monitor to each visit, thank you  Insulin 40 in am and 30 at supper, adjust up/down 4-5 units if sugar stays > 200 or < 100  Stop Glimeperide  Check BP daily        Elayne Snare 09/08/2020, 3:38 PM   Note: This office note was prepared with Dragon voice recognition system technology. Any transcriptional errors that result from this process are unintentional.

## 2020-09-30 ENCOUNTER — Other Ambulatory Visit: Payer: Self-pay | Admitting: Endocrinology

## 2020-10-06 ENCOUNTER — Telehealth: Payer: Self-pay | Admitting: *Deleted

## 2020-10-06 NOTE — Telephone Encounter (Signed)
Dexcom 6 transmitter --PA  [facility]) has been denied.

## 2020-10-29 ENCOUNTER — Encounter (HOSPITAL_COMMUNITY): Payer: Self-pay | Admitting: Emergency Medicine

## 2020-10-29 ENCOUNTER — Observation Stay (HOSPITAL_COMMUNITY)
Admission: EM | Admit: 2020-10-29 | Discharge: 2020-10-30 | Disposition: A | Discharge status: Home / Self Care | Payer: BC Managed Care – PPO | Attending: Internal Medicine | Admitting: Internal Medicine

## 2020-10-29 ENCOUNTER — Emergency Department (HOSPITAL_COMMUNITY): Payer: BC Managed Care – PPO

## 2020-10-29 ENCOUNTER — Other Ambulatory Visit: Payer: Self-pay | Admitting: Internal Medicine

## 2020-10-29 DIAGNOSIS — E119 Type 2 diabetes mellitus without complications: Secondary | ICD-10-CM

## 2020-10-29 DIAGNOSIS — Z20822 Contact with and (suspected) exposure to covid-19: Secondary | ICD-10-CM | POA: Insufficient documentation

## 2020-10-29 DIAGNOSIS — R0789 Other chest pain: Principal | ICD-10-CM | POA: Insufficient documentation

## 2020-10-29 DIAGNOSIS — R778 Other specified abnormalities of plasma proteins: Secondary | ICD-10-CM | POA: Diagnosis not present

## 2020-10-29 DIAGNOSIS — R079 Chest pain, unspecified: Secondary | ICD-10-CM | POA: Diagnosis not present

## 2020-10-29 DIAGNOSIS — Z794 Long term (current) use of insulin: Secondary | ICD-10-CM | POA: Insufficient documentation

## 2020-10-29 DIAGNOSIS — N179 Acute kidney failure, unspecified: Secondary | ICD-10-CM

## 2020-10-29 DIAGNOSIS — I10 Essential (primary) hypertension: Secondary | ICD-10-CM | POA: Diagnosis not present

## 2020-10-29 DIAGNOSIS — R7989 Other specified abnormal findings of blood chemistry: Secondary | ICD-10-CM

## 2020-10-29 DIAGNOSIS — Z79899 Other long term (current) drug therapy: Secondary | ICD-10-CM | POA: Diagnosis not present

## 2020-10-29 DIAGNOSIS — M549 Dorsalgia, unspecified: Secondary | ICD-10-CM | POA: Diagnosis present

## 2020-10-29 DIAGNOSIS — R002 Palpitations: Secondary | ICD-10-CM

## 2020-10-29 DIAGNOSIS — Z7984 Long term (current) use of oral hypoglycemic drugs: Secondary | ICD-10-CM | POA: Insufficient documentation

## 2020-10-29 LAB — BASIC METABOLIC PANEL
Anion gap: 10 (ref 5–15)
BUN: 18 mg/dL (ref 6–20)
CO2: 28 mmol/L (ref 22–32)
Calcium: 9.8 mg/dL (ref 8.9–10.3)
Chloride: 99 mmol/L (ref 98–111)
Creatinine, Ser: 1.02 mg/dL — ABNORMAL HIGH (ref 0.44–1.00)
GFR, Estimated: >60 mL/min (ref >60)
Glucose, Bld: 469 mg/dL — ABNORMAL HIGH (ref 70–99)
Potassium: 4 mmol/L (ref 3.5–5.1)
Sodium: 137 mmol/L (ref 135–145)

## 2020-10-29 LAB — CBC WITH DIFFERENTIAL/PLATELET
Abs Immature Granulocytes: 0.01 10*3/uL (ref 0.00–0.07)
Basophils Absolute: 0 10*3/uL (ref 0.0–0.1)
Basophils Relative: 0 %
Eosinophils Absolute: 0.1 10*3/uL (ref 0.0–0.5)
Eosinophils Relative: 2 %
HCT: 39.1 % (ref 36.0–46.0)
Hemoglobin: 12.9 g/dL (ref 12.0–15.0)
Immature Granulocytes: 0 %
Lymphocytes Relative: 30 %
Lymphs Abs: 1.7 10*3/uL (ref 0.7–4.0)
MCH: 30.1 pg (ref 26.0–34.0)
MCHC: 33 g/dL (ref 30.0–36.0)
MCV: 91.4 fL (ref 80.0–100.0)
Monocytes Absolute: 0.4 10*3/uL (ref 0.1–1.0)
Monocytes Relative: 7 %
Neutro Abs: 3.4 10*3/uL (ref 1.7–7.7)
Neutrophils Relative %: 61 %
Platelets: 225 10*3/uL (ref 150–400)
RBC: 4.28 MIL/uL (ref 3.87–5.11)
RDW: 13.1 % (ref 11.5–15.5)
WBC: 5.6 10*3/uL (ref 4.0–10.5)
nRBC: 0 % (ref 0.0–0.2)

## 2020-10-29 LAB — TROPONIN I (HIGH SENSITIVITY)
Troponin I (High Sensitivity): 30 ng/L — ABNORMAL HIGH (ref <18)
Troponin I (High Sensitivity): 30 ng/L — ABNORMAL HIGH (ref <18)

## 2020-10-29 LAB — CBG MONITORING, ED
Glucose-Capillary: 375 mg/dL — ABNORMAL HIGH (ref 70–99)
Glucose-Capillary: 261 mg/dL — ABNORMAL HIGH (ref 70–99)

## 2020-10-29 LAB — D-DIMER, QUANTITATIVE: D-Dimer, Quant: 0.31 ug/mL-FEU (ref 0.00–0.50)

## 2020-10-29 IMAGING — DX DG CHEST 2V
2 series · 2 of 2 positions shown · non-contrast
Comparison: [DATE]

CLINICAL DATA: Chest pain tachycardia

EXAM:
CHEST - 2 VIEW

[chest pa]
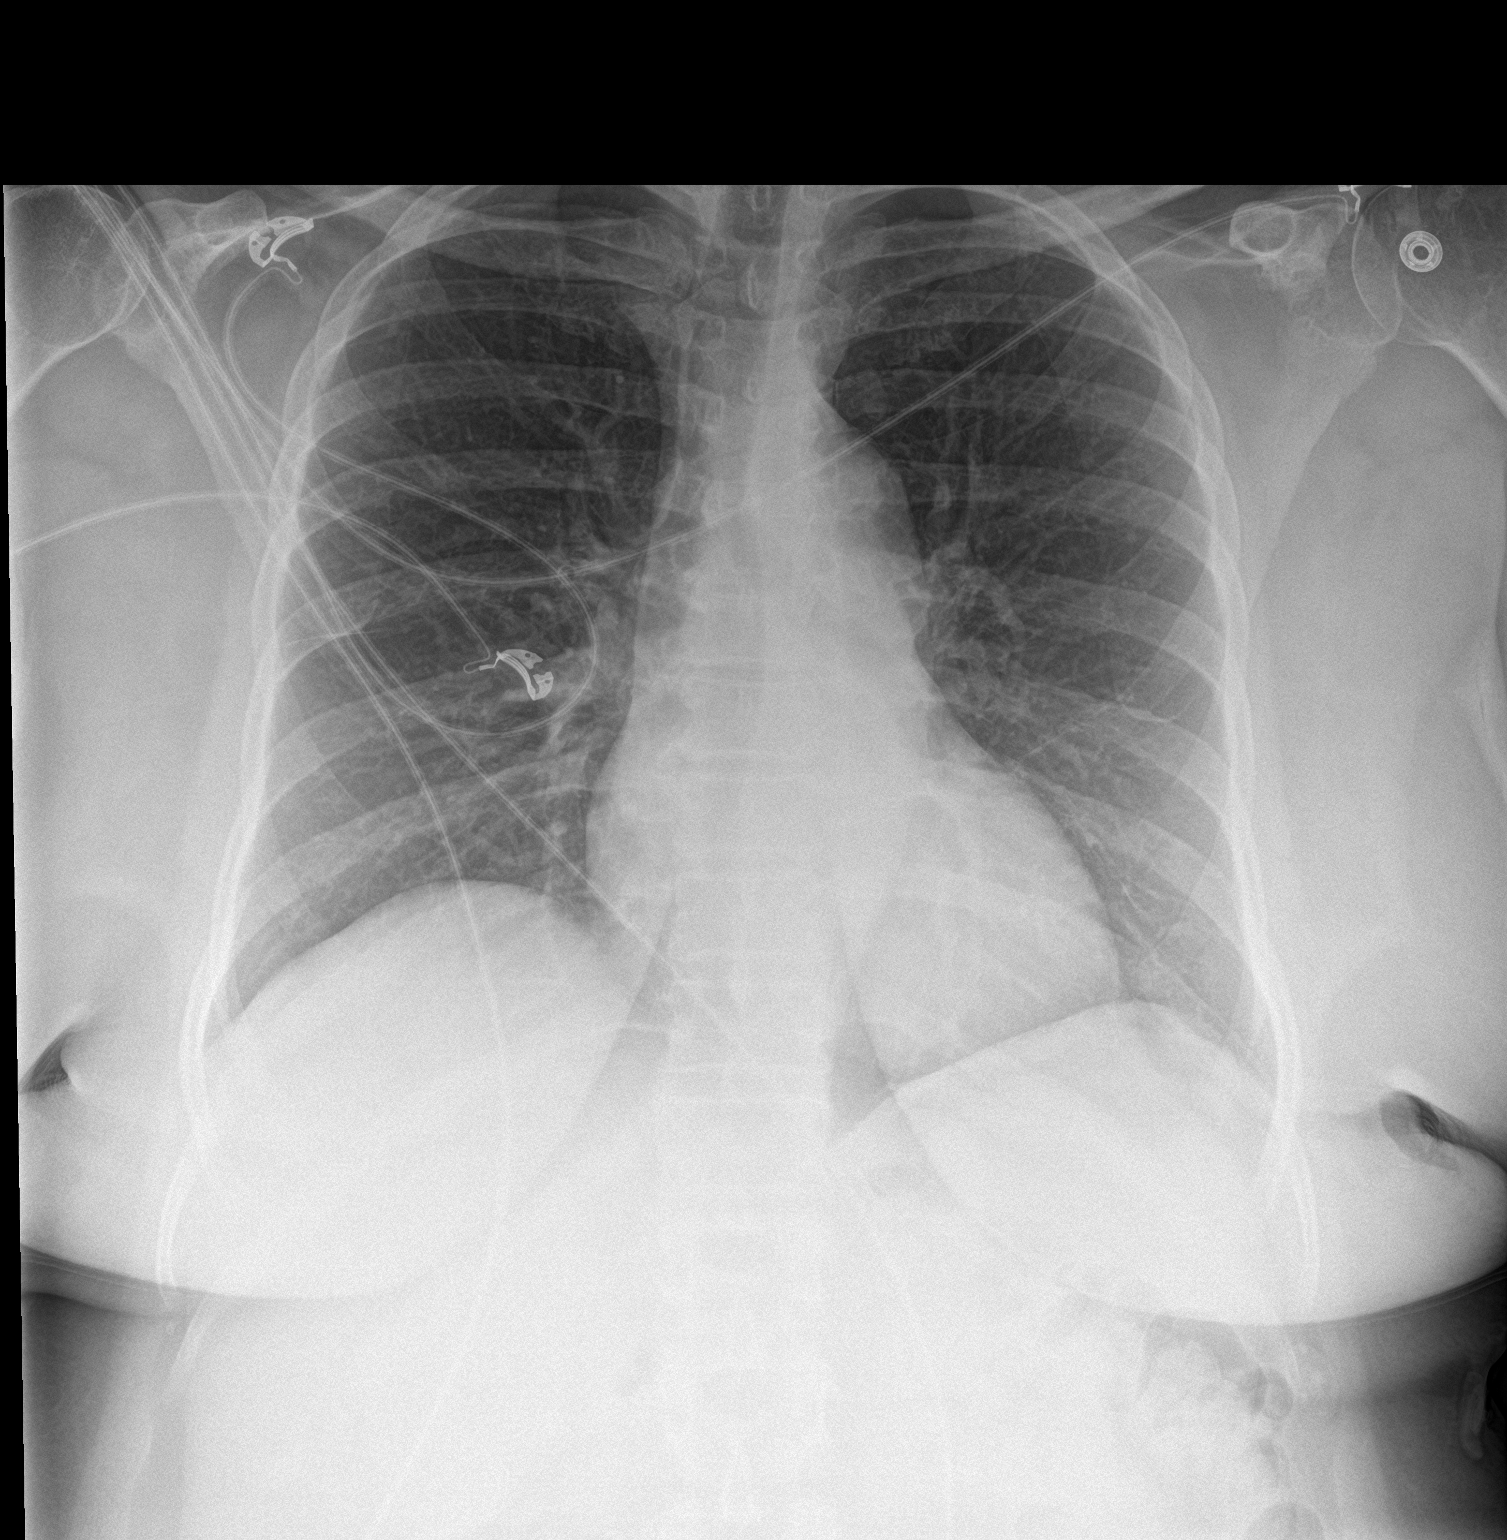

[chest lat]
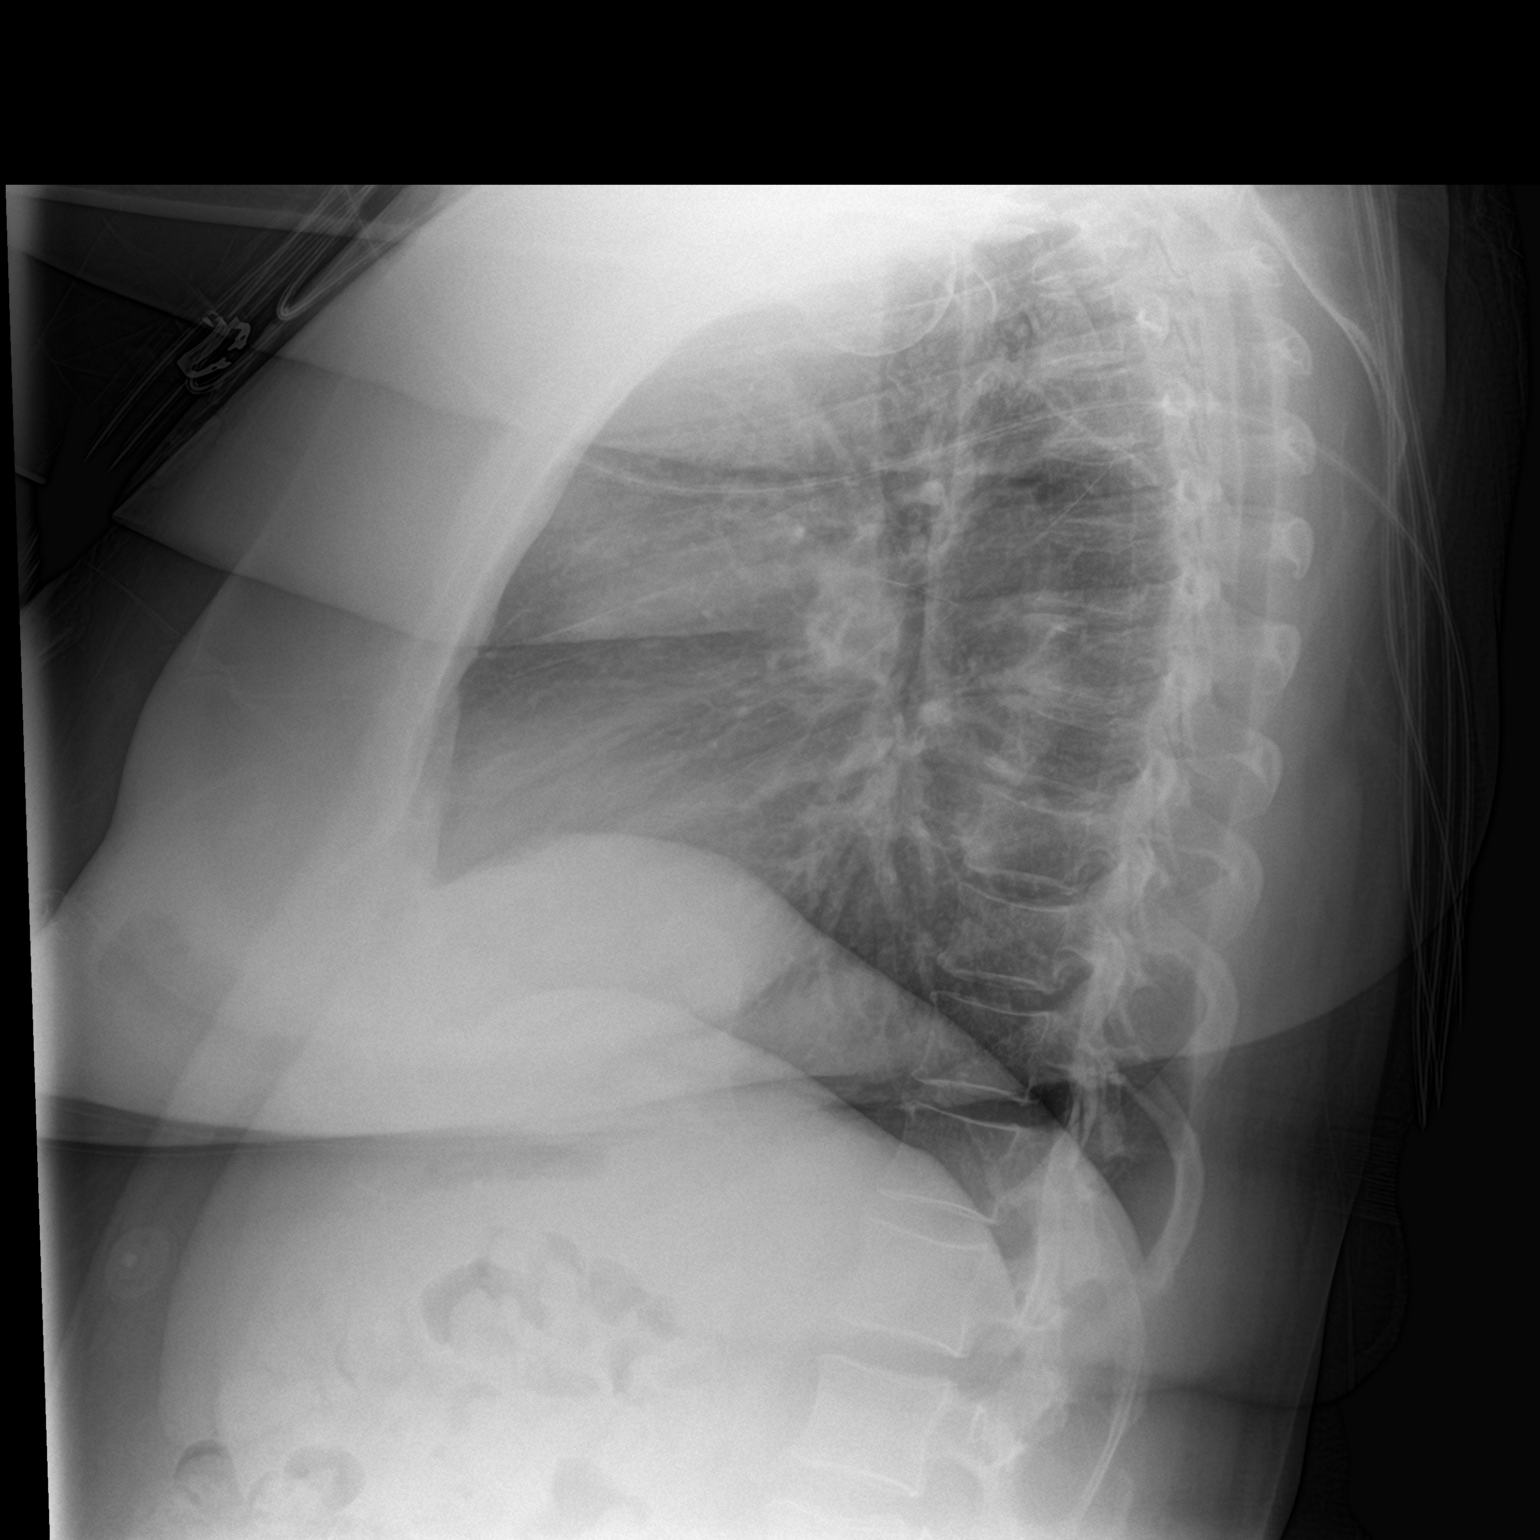

[2 of 2 positions shown; findings below may reference images not displayed]

FINDINGS: The heart size and mediastinal contours are within normal limits. No
focal consolidation. No pleural effusion. No pneumothorax. The
visualized skeletal structures are unremarkable.
IMPRESSION: No active cardiopulmonary disease.

## 2020-10-29 MED ORDER — ACETAMINOPHEN 325 MG PO TABS: 650.0000 mg | ORAL_TABLET | ORAL | Status: DC | PRN

## 2020-10-29 MED ORDER — ENOXAPARIN SODIUM 40 MG/0.4ML ~~LOC~~ SOLN
40.0000 mg | Freq: Every day | SUBCUTANEOUS | Status: DC
  Administered 2020-10-30: 40 mg via SUBCUTANEOUS
  Filled 2020-10-29: qty 0.4

## 2020-10-29 MED ORDER — SODIUM CHLORIDE 0.9 % IV BOLUS
500.0000 mL | Freq: Once | INTRAVENOUS | Status: AC
  Administered 2020-10-29: 500 mL via INTRAVENOUS

## 2020-10-29 MED ORDER — INSULIN ASPART 100 UNIT/ML ~~LOC~~ SOLN
0.0000 [IU] | Freq: Every day | SUBCUTANEOUS | Status: DC
  Administered 2020-10-30: 3 [IU] via SUBCUTANEOUS

## 2020-10-29 MED ORDER — INSULIN ASPART PROT & ASPART (70-30 MIX) 100 UNIT/ML ~~LOC~~ SUSP
20.0000 [IU] | Freq: Two times a day (BID) | SUBCUTANEOUS | Status: DC
  Administered 2020-10-30: 20 [IU] via SUBCUTANEOUS
  Filled 2020-10-29: qty 10

## 2020-10-29 MED ORDER — ONDANSETRON HCL 4 MG/2ML IJ SOLN: 4.0000 mg | Freq: Four times a day (QID) | INTRAMUSCULAR | Status: DC | PRN

## 2020-10-29 MED ORDER — INSULIN ASPART 100 UNIT/ML ~~LOC~~ SOLN
0.0000 [IU] | Freq: Three times a day (TID) | SUBCUTANEOUS | Status: DC
  Administered 2020-10-30: 9 [IU] via SUBCUTANEOUS
  Administered 2020-10-30: 7 [IU] via SUBCUTANEOUS

## 2020-10-29 MED ORDER — AMLODIPINE BESYLATE 5 MG PO TABS
10.0000 mg | ORAL_TABLET | Freq: Every day | ORAL | Status: DC
  Administered 2020-10-30 (×2): 10 mg via ORAL
  Filled 2020-10-29 (×2): qty 2

## 2020-10-29 MED ORDER — METOPROLOL SUCCINATE ER 100 MG PO TB24
100.0000 mg | ORAL_TABLET | Freq: Every day | ORAL | Status: DC
  Administered 2020-10-30 (×2): 100 mg via ORAL
  Filled 2020-10-29 (×2): qty 1

## 2020-10-29 MED ORDER — INSULIN ASPART 100 UNIT/ML ~~LOC~~ SOLN
5.0000 [IU] | Freq: Once | SUBCUTANEOUS | Status: AC
  Administered 2020-10-29: 5 [IU] via INTRAVENOUS

## 2020-10-29 MED ORDER — SIMVASTATIN 20 MG PO TABS: 20.0000 mg | ORAL_TABLET | Freq: Every day | ORAL | Status: DC

## 2020-10-29 NOTE — ED Notes (Signed)
Patient transported to X-ray 

## 2020-10-29 NOTE — ED Notes (Signed)
Yolonda Kida  (pt brother) called to speak to pt, please call back [phone]

## 2020-10-29 NOTE — ED Triage Notes (Signed)
Emergency Medicine Provider Triage Evaluation Note  Shannon Oliver , a 60 y.o. female  was evaluated in triage.  Pt complains of sent by PCP office for unknown abnormal lab value. Patient states she felt her heart racing with c/o her heart feeling tired a few days ago. Patient called her pcp but didn't hear back so she mentioned this to her PCP at her regularly scheduled appointment today. PCP ran unknown labs and called her later to say something was abnormal and to go to the ER. Pt feels fine at this time.  Review of Systems  Positive: No complaints  Negative: CP, SHOB  Physical Exam  BP (!) 140/96 (BP Location: Right Arm)   Pulse 85   Temp 98.4 F (36.9 C) (Oral)   Resp 20   LMP 08/02/2013 Comment: spotting  SpO2 99%  Gen:   Awake, no distress   HEENT:  Atraumatic  Resp:  Normal effort  Cardiac:  Normal rate  Abd:   Nondistended, nontender  MSK:   Moves extremities without difficulty  Neuro:  Speech clear   Medical Decision Making  Medically screening exam initiated at 1:20 PM.  Appropriate orders placed.  Shannon Oliver was informed that the remainder of the evaluation will be completed by another provider, this initial triage assessment does not replace that evaluation, and the importance of remaining in the ED until their evaluation is complete.  Clinical Impression  Advised pt to call her PCP office and ask what was abnormal, no records found in epic at this time. Asked pt to notify staff if symptoms return while waiting.    Tacy Learn, PA-C 10/29/20 1322

## 2020-10-29 NOTE — H&P (Addendum)
Date: 10/29/2020               Patient Name:  Shannon Oliver MRN: [id number]  DOB: 02/06/1961 Age / Sex: 60 y.o., female   PCP: Department, [facility] Service: Internal Medicine Teaching Service         Attending Physician: Dr. Velna Ochs, MD    First Contact: Edison Simon, MD Pager: Lavella Hammock [phone]  Second Contact: Jose Persia, MD Pager: IB [phone]       After Hours (After 5p/  First Contact Pager: [phone]  weekends / holidays): Second Contact Pager: [phone]   SUBJECTIVE   Chief Complaint: Palpitations, chest pain  History of Present Illness:  Shannon Oliver is a 60 year old female with past medical history of hypertension, hyperlipidemia, type 2 diabetes, atypical chest pain, palpitations, recent left shoulder surgery (08/2020) sent to the ED by her PCP after having palpitations and chest pain for 2 days and found to have an elevated troponin of 15.  Patient states she was in her usual state of health 2 days ago when she was laying down and started having palpitations and feeling as though her heart was racing.  She states that during this time she also had centralized non-reproducible chest pain that did not radiate, difficulty taking a deep breath.  She describes the pain as a "hurting" sensation.  She had associated centralized upper back pain, different from her chronic back pain.  Nothing she did made the symptoms worse.  Symptoms improve when she laid flat.  She denies having any symptoms at this time.  She has had the symptoms in the past, most notably in 2020 with a extensive cardiac work-up that included an exercise stress test. At that time nightly alcohol use caused her chest pain to occur, that is why she has decreased her alcohol intake  stress test was not completed as she was fatigued. No abnormalities noted on workup.  She was started on metoprolol succinate 100 mg daily.  She was checking her blood pressure as well as heart rate at home,  blood pressure was in the 150s/90-100s.  Heart rate within the 110s to 130s.  She notes that she did not call 911 or go to the emergency department because she had a follow-up appointment with her PCP in 2 days.  When she were to the PCPs office then the troponins per patient level was 15 and she was then sent to the ED for further evaluation.  She endorses associated headache since she was admitted, headache is on top of her head.  Denies light making it better or worse.  She notes that it has eased up since she has been here.  Denies any change in her vision.  She denies recent illness, lightheadedness, dizziness, syncope.  Fever, chills, neck pain, cough, abdominal pain, vomiting, diarrhea.  She has been out of work recently due to her left shoulder surgery after having a torn biceps tendon after tripping and falling.  She states she has had a lot of stressors since then with getting back to work and dealing with [employer].  She notes most of the time she sits in bed and at times feels down and does not feel like managing her chronic medical conditions because of this.  She notes recently speaking with her primary care provider who provided her with counseling services.  Patient denies feeling as though she wants to harm herself at this time.  ED  Course: Cardiac work-up was initiated by ED provider.  Vital signs were stable, with some hypertension.  CBC was unremarkable.  CMP elevation of creatinine to 1.82, baseline under 1.  She was hyperglycemic at 469.  Troponins flat at 30.  D-dimer 0.31.  EKG without changes from prior.  Chest x-ray no acute cardiopulmonary processes. With elevated troponins and patient's 2-day history of palpitations ED provider requested admission for observation.  Meds:  Metoprolol succinate Losartan Amlodipine [place] Insulin 30/70 Metformin Rosuvastatin Tylenol Ibuprofen  Past Medical History:  Diagnosis Date  . Abnormal EKG    LVH with strain  . Acid reflux    . Depression   . Diabetes mellitus    A1C over 9  . HLD (hyperlipidemia)   . Hypertension   . Noncompliance   . Obesity     Past Surgical History:  Procedure Laterality Date  . COLONOSCOPY WITH PROPOFOL N/A 04/29/2015   Procedure: COLONOSCOPY WITH PROPOFOL;  Surgeon: Garlan Fair, MD;  Location: [facility];  Service: Endoscopy;  Laterality: N/A;  . HYSTEROSCOPY WITH D & C N/A 09/07/2017   Procedure: DILATATION AND CURETTAGE /HYSTEROSCOPY;  Surgeon: Sloan Leiter, MD;  Location: [facility];  Service: Gynecology;  Laterality: N/A;  . 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  Occasional vodka use, 1-2 shots.  Denies illicit substance use.  Notes over the past few weeks she has taken her husband's tramadol occasionally for left shoulder pain  Family History:  Uncertain paternal history.  Uncertain paternal history.  States mother had heart attack she believes this occurred in her 80's. As well as stroke.  Mother also with pancreatic cancer.  States sister had some type of arrhythmia she believes sick sinus syndrome.  Allergies: Allergies as of 10/29/2020 - Review Complete 10/29/2020  Allergen Reaction Noted  . Canagliflozin  10/29/2020  . Hydrocodone Hypertension 10/16/2013   Review of Systems: A complete ROS was negative except as per HPI.   OBJECTIVE:   Physical Exam: Blood pressure 132/88, pulse 93, temperature 98.2 F (36.8 C), temperature source Oral, resp. rate (!) 23, last menstrual period 08/02/2013, SpO2 100 %.  Constitutional: Well-appearing female, resting in bed comfortably. HENT: normocephalic atraumatic, mucous membranes moist Eyes: conjunctiva non-erythematous, PERRL Neck:  supple, no lymphadenopathy or thyromegaly Cardiovascular: regular rate and rhythm, no m/r/g.  No JVD Pulmonary/Chest: normal work of breathing on room air, lungs clear to auscultation bilaterally Abdominal: soft, non-tender, non-distended MSK: normal bulk and tone.  Trace lower extremity edema bilaterally. Chest pain not reproducible with palpation. Neurological: alert & oriented x 3 Skin: warm and dry, 3 well-healed surgical scars of left shoulder from repair Psych: Normal mood, thought process  Labs: CBC    Component Value Date/Time   WBC 5.6 10/29/2020 1335   RBC 4.28 10/29/2020 1335   HGB 12.9 10/29/2020 1335   HGB 12.0 05/30/2017 1858   HCT 39.1 10/29/2020 1335   HCT 36.3 05/30/2017 1858   PLT 225 10/29/2020 1335   PLT 248 05/30/2017 1858   MCV 91.4 10/29/2020 1335   MCV 90 05/30/2017 1858   MCH 30.1 10/29/2020 1335   MCHC 33.0 10/29/2020 1335   RDW 13.1 10/29/2020 1335   RDW 13.7 05/30/2017 1858   LYMPHSABS 1.7  10/29/2020 1335   LYMPHSABS 3.2 (H) 05/30/2017 1858   MONOABS 0.4 10/29/2020 1335   EOSABS 0.1 10/29/2020 1335   EOSABS 0.2 05/30/2017 1858   BASOSABS 0.0 10/29/2020 1335   BASOSABS 0.0 05/30/2017 1858     CMP     Component Value Date/Time   NA 137 10/29/2020 1335   K 4.0 10/29/2020 1335   CL 99 10/29/2020 1335   CO2 28 10/29/2020 1335   GLUCOSE 469 (H) 10/29/2020 1335   BUN 18 10/29/2020 1335   CREATININE 1.02 (H) 10/29/2020 1335   CREATININE 1.00 10/16/2013 1759   CALCIUM 9.8 10/29/2020 1335   PROT 7.6 09/05/2020 1522   ALBUMIN 4.0 09/05/2020 1522   AST 21 09/05/2020 1522   ALT 20 09/05/2020 1522   ALKPHOS 80 09/05/2020 1522   BILITOT 0.4 09/05/2020 1522   GFRNONAA >60 10/29/2020 1335   GFRAA >60 08/09/2018 1017   Troponin - 1335 - 30 ng/L          1730 - 30 ng/L  Imaging: DG Chest 2 View  Result Date: 10/29/2020 CLINICAL DATA:  Chest pain tachycardia EXAM: CHEST - 2 VIEW COMPARISON:  October 16, 2017 FINDINGS: The heart size and mediastinal  contours are within normal limits. No focal consolidation. No pleural effusion. No pneumothorax. The visualized skeletal structures are unremarkable. IMPRESSION: No active cardiopulmonary disease. Electronically Signed   By: Dahlia Bailiff MD   On: 10/29/2020 20:32   EKG: personally reviewed my interpretation is normal sinus rhythm at a rate of 90.  Normal axis normal QRS.  Specific ST wave changes that are unchanged from prior EKG from 09/01/2017.  Echo 2014 -LVEF 55 to 65% ; PW 15 cm and IVS 1.17 cm  [facility] Cardiology Evaluation Results - 2020  Zio Patch - Unable to view results  Exercise Stress Test Results  Rest ECG Normal sinus rhythm. No evidence of resting ischemia or old infarct. Stress Stress Type: Exercise Peak HR: 133 bpm        HR Response: Normal Peak BP: 140/80 mmHg      BP Response: Normal Predicted HR: 162 bpm      HR BP Product: 18620 % of predicted HR: 82      Max Exercise: 5.9 METS Test Duration: 3.15 min Reason for Termination: Fatigue Exercise Effort: Poor Stress Interpretation Normal resting left ventricular function, with no resting segmental abnormality. False chord noted in left ventricle. LVH. Appropriate hemodynamic response to exercise. No significant ST T wave changes with exercise. EKG portion is negative for ischemia by diagnostic criteria. Normal augmentation of LV contractility throughout after stress, with no induced wall motion abnormality. Results Global LVEF (rest): Hyperkinetic (LVEF >70%) Global LVEF (stress): Appears preserved ECG No ST-T wave changes. Arrhythmias No rhythm abnormality. Symptoms Shortness of breath. Fatigue. Symptoms resolved with rest.   ASSESSMENT & PLAN:   Assessment & Plan by Problem: Active Problems:   Chest pain  [place] Nobbe is a 60 y.o. with pertinent PMH of  hypertension, hyperlipidemia, type 2 diabetes, atypical chest pain, palpitations, recent left shoulder surgery  (08/2020) who presented with palpitations, chest pain and admitted for cardiac work-up and observation on hospital day 0  Palpitations Tachycardia Atypical chest pain Back pain Patient with 2 days of palpitations, tachycardia, chest pain as well as back pain that onset while she was at rest and persisted for 2 days straight.  Persisted 2 days, asymptomatic at this time.  Since admission vital signs have been stable without oxygen  supplementation needed. Cardiac work-up thus far has been reassuring; EKG unchanged, troponins flat at 30, symptoms resolved without intervention, patient in normal sinus rhythm.  D-dimer negative, low suspicion for PE.  Back pain does raise a question of dissection however patient's blood pressure has been stable and at baseline.  She was hypertensive we are talking with her, but we will continue to monitor and restart home meds.  Patient with full cardiology work-up as per above, by [facility] cardiology.  Per their documentation her symptoms were associated with normal sinus rhythm she did have 1 short run of V. tach and rare PVCs but patient was on beta-blocker so no further testing was indicated at that time.  She does not continue to follow-up with them due to insurance issues.  She denies missing metoprolol doses recently.  Will observe patient overnight, trend troponins and monitor on telemetry.  -Continue telemetry monitoring -Trend troponins until persistently flat or down-trending -TSH pending -echo today per primary -Continue home metoprolol -Acute pain or change in vital signs, consider further imaging rule out dissection. -Consider sending home on zio patch -Follow-up with PCP/cardiology on outpatient basis if work up unremarkable.  HTN Baseline BP 150s/90s. -held losartan in setting of AKI -continue amlodipine  HLD Lipid panel 1 month ago, cholesterol 163. LDL 78.  Triglycerides 767, however uncertain if these were fasting lipid  panel. -Continue rosuvastatin -Follow-up with PCP for elevated triglycerides  Type II DM A1c 1 month ago 10.3.  Home medication of insulin 70/30, metformin, Farxiga.  Notes that she is felt down has not felt like working on her health recently. -SSI while admitted -restart 70/30  Urinary frequency Endorses increased urination since starting Farxiga.  No suspicion for infection at this time. -Continue to monitor  OSA Patient uses CPAP at home, sleep study done in 2020 at [facility] CPAP during hospitalization, nightly  Symptoms of depression Follow up PCP  Diet: Carb-Modified VTE: Enoxaparin IVF: None,None Code: Full  Prior to Admission Living Arrangement: Home, living husband Anticipated Discharge Location: Home Barriers to Discharge:  Further observation and monitoring  Patient has been in [place] patient for some time, had insurance difficulties and is recently back with them. [facility]. Patient can be transferred to hospitalist service tomorrow if day team wishes to do so.   Dispo: Admit patient to Observation with expected length of stay less than 2 midnights.  Signed: Riesa Pope, MD Internal Medicine Resident PGY-1 Pager: [phone]  10/29/2020, 11:05 PM

## 2020-10-29 NOTE — ED Notes (Signed)
Admitting at bedside 

## 2020-10-29 NOTE — Hospital Course (Addendum)
Admitted 10/29/2020  Allergies: Hydrocodone Pertinent Hx: DM, HTN, HLD, obesity  60 y.o. female p/w heart racing, fatigue, CP, and dyspnea x 2-3 days  *Palpitation, chest pain, dyspnea: For past 3 days.  Saw PCP before admission and was sent to ED because of troponin was elevated at 30.  Currently symptoms free.  EKG with nonspecific ST-T changes but unchanged from before.  Had prior Hx w negative stress echo 05/2019. Admitted for observation and ACS rule out and cardiac monitoring for probable arrhythmia.  *Mild AKI: Cr 1.02 from baseline of 0.7. holding home losartan tonight. s/p 500 ml NS in ED.  *Hyperglycemia: w/o DKA or HHS. Improved w subcu insulin.   Consults: none  Meds: Amlodipine, insulin, metoprolol, crestor VTE ppx: lovenox IVF: s/p 500 ml ns Diet: HHCM  Home: Medications: Amlodipine, Farxiga, glimepiride, insulin NovoLog, potassium supplement, losartan-HCTZ, metformin, methocarbamol, metoprolol, simvastatin, tizanidine urine   EXERCISE STRESS ECHO W/DOPPLER. Procedure Date Date: 05/16/2019

## 2020-10-29 NOTE — ED Provider Notes (Signed)
[facility] EMERGENCY DEPARTMENT Provider Note   CSN: [id number] Arrival date & time: 10/29/20  1251     History No chief complaint on file.   Shannon Oliver is a 60 y.o. female.  HPI     60 year old female with a history of diabetes, hypertension, hyperlipidemia, obesity, presents with concern for heart racing, fatigue, chest pain and dyspnea a few days ago, and lab abnormality from her primary care doctor's office today.  Reports about 3 days ago, she had 2 days of heart racing, with heart rate up to the 140s.  Reports she had associated sensation of chest soreness across the middle of her chest and spreading bilaterally, with associated nausea, diaphoresis, dyspnea and fatigue.  Reports that the symptoms did appear to be worse if she would try to walk or do things.  Reports this lasted for about 2 days and then improved.  She debated coming to be seen at that time, but decided to wait until her regular doctors visit which was scheduled today to discuss it with anyone.  Saw her PCP today and mentioned the symptoms, and her primary care physician ordered a troponin which came back positive and was instructed to go to the emergency department.  She currently denies any chest pain, shortness of breath or palpitations.  Denies history of smoking or other drugs, reports very occasional alcohol use.  She does report a family history of coronary artery disease, but does not believe it was earlier less than 44.  She denies history of DVT or PE.  She had surgery on her left biceps tendon in February, and reports some chronic left-sided shoulder pain.  She has a history of hypertension, diabetes, hyperlipidemia.  Past Medical History:  Diagnosis Date  . Abnormal EKG    LVH with strain  . Acid reflux   . Depression   . Diabetes mellitus    A1C over 9  . HLD (hyperlipidemia)   . Hypertension   . Noncompliance   . Obesity     Patient Active Problem List   Diagnosis  Date Noted  . Elevated troponin 10/30/2020  . AKI (acute kidney injury) ([place]) 10/30/2020  . Chest pain 10/29/2020  . Postmenopausal bleeding 06/05/2017  . Neck pain 03/01/2014  . Edema 01/25/2013  . DM (diabetes mellitus) ([place]) 01/25/2013  . Dyspnea 01/25/2013  . Abnormal ECG 01/25/2013  . Hypertension   . Acid reflux     Past Surgical History:  Procedure Laterality Date  . COLONOSCOPY WITH PROPOFOL N/A 04/29/2015   Procedure: COLONOSCOPY WITH PROPOFOL;  Surgeon: Garlan Fair, MD;  Location: [facility];  Service: Endoscopy;  Laterality: N/A;  . HYSTEROSCOPY WITH D & C N/A 09/07/2017   Procedure: DILATATION AND CURETTAGE /HYSTEROSCOPY;  Surgeon: Sloan Leiter, MD;  Location: [facility];  Service: Gynecology;  Laterality: N/A;  . KNEE ARTHROSCOPY W/ MENISCAL REPAIR Right      OB History    Gravida  3   Para  3   Term  3   Preterm  0   AB  0   Living  3     SAB  0   IAB  0   Ectopic  0   Multiple      Live Births  3           Family History  Problem Relation Age of Onset  . Diabetes Neg Hx     Social History   Tobacco Use  .  Smoking status: Never Smoker  . Smokeless tobacco: Never Used  Vaping Use  . Vaping Use: Never used  Substance Use Topics  . Alcohol use: Not Currently    Comment: occ  . Drug use: No    Home Medications Prior to Admission medications   Medication Sig Start Date End Date Taking? Authorizing Provider  acetaminophen (TYLENOL) 500 MG tablet Take 500-1,000 mg by mouth every 6 (six) hours as needed for mild pain.   Yes [provider]  amLODipine (NORVASC) 10 MG tablet TAKE 1 TABLET EVERY DAY ONCE A DAY ORALLY 90 Patient taking differently: Take 10 mg by mouth daily. 03/13/18  Yes Elayne Snare, MD  clotrimazole (LOTRIMIN) 1 % cream Apply 1 application topically in the morning and at bedtime. 10/29/20  Yes [provider]  Continuous Blood Gluc Sensor (DEXCOM G6 SENSOR) MISC Use to monitor blood  sugar, change after 10 days 09/08/20  Yes Elayne Snare, MD  cyclobenzaprine (FLEXERIL) 10 MG tablet Take 10 mg by mouth every 6 (six) hours as needed for muscle spasms. 08/27/20  Yes [provider]  dapagliflozin propanediol (FARXIGA) 5 MG TABS tablet Take 1 tablet (5 mg total) by mouth daily. 09/08/20  Yes Elayne Snare, MD  gabapentin (NEURONTIN) 300 MG capsule Take 300 mg by mouth 2 (two) times daily. 09/05/20  Yes [provider]  glucose blood (ACCU-CHEK GUIDE) test strip Use as instructed to check blood sugar one time daily. 11/16/17  Yes Elayne Snare, MD  Insulin Syringe-Needle U-100 30G X 1/2" 0.3 ML MISC Use twice a day with insulin 10/17/17  Yes Elayne Snare, MD  KLOR-CON M20 20 MEQ tablet TAKE 1 TABLET BY MOUTH EVERY DAY Patient taking differently: Take 20 mEq by mouth daily. 10/01/20  Yes Philemon Kingdom, MD  loratadine (CLARITIN) 10 MG tablet Take 10 mg by mouth daily as needed for allergies.   Yes [provider]  losartan-hydrochlorothiazide (HYZAAR) 100-25 MG per tablet Take 1 tablet by mouth every morning.    Yes [provider]  metFORMIN (GLUCOPHAGE) 1000 MG tablet Take 1,000 mg by mouth 2 (two) times daily. 03/18/20  Yes [provider]  methocarbamol (ROBAXIN) 500 MG tablet Take 1 tablet (500 mg total) by mouth 2 (two) times daily. 03/23/20  Yes Venter, Margaux, PA-C  metoprolol succinate (TOPROL-XL) 100 MG 24 hr tablet Take 100 mg by mouth daily.  09/14/17  Yes [provider]  Multiple Vitamins-Calcium (ONE-A-DAY WOMENS PO) Take 1 tablet by mouth daily.   Yes [provider]  naproxen (NAPROSYN) 500 MG tablet Take 500 mg by mouth 2 (two) times daily as needed for moderate pain. 08/19/20  Yes [provider]  NOVOLIN 70/30 FLEXPEN (70-30) 100 UNIT/ML KwikPen Inject 30 Units into the skin in the morning and at bedtime. 03/18/20  Yes [provider]  ondansetron (ZOFRAN) 4 MG tablet Take 4 mg by mouth every 6 (six)  hours as needed for vomiting or nausea. 08/19/20  Yes [provider]  rosuvastatin (CRESTOR) 20 MG tablet Take 20 mg by mouth at bedtime. 10/20/20  Yes [provider]  tiZANidine (ZANAFLEX) 2 MG tablet Take 2 mg by mouth every 12 (twelve) hours as needed for muscle pain. 02/22/20  Yes [provider]  traMADol (ULTRAM) 50 MG tablet Take 50 mg by mouth every 6 (six) hours as needed for moderate pain.   Yes [provider]    Allergies    Canagliflozin and Hydrocodone  Review of Systems  Review of Systems  Constitutional: Positive for diaphoresis and fatigue. Negative for fever.  Respiratory: Positive for shortness of breath (did have not current). Negative for cough.   Cardiovascular: Positive for chest pain (did have not current).  Gastrointestinal: Positive for nausea. Negative for abdominal pain, diarrhea and vomiting.  Genitourinary: Negative for difficulty urinating.  Musculoskeletal: Negative for back pain and neck pain.  Skin: Negative for rash.  Neurological: Negative for syncope and headaches.    Physical Exam Updated Vital Signs BP 136/79   Pulse 81   Temp 97.9 F (36.6 C) (Oral)   Resp 19   LMP 08/02/2013 Comment: spotting  SpO2 95%   Physical Exam Vitals and nursing note reviewed.  Constitutional:      General: She is not in acute distress.    Appearance: She is well-developed. She is not diaphoretic.  HENT:     Head: Normocephalic and atraumatic.  Eyes:     Conjunctiva/sclera: Conjunctivae normal.  Cardiovascular:     Rate and Rhythm: Normal rate and regular rhythm.     Heart sounds: Normal heart sounds. No murmur heard. No friction rub. No gallop.   Pulmonary:     Effort: Pulmonary effort is normal. No respiratory distress.     Breath sounds: Normal breath sounds. No wheezing or rales.  Abdominal:     General: There is no distension.     Palpations: Abdomen is soft.     Tenderness: There is no abdominal tenderness.  There is no guarding.  Musculoskeletal:        General: No tenderness.     Cervical back: Normal range of motion.  Skin:    General: Skin is warm and dry.     Findings: No erythema or rash.  Neurological:     Mental Status: She is alert and oriented to person, place, and time.     ED Results / Procedures / Treatments   Labs (all labs ordered are listed, but only abnormal results are displayed) Labs Reviewed  BASIC METABOLIC PANEL - Abnormal; Notable for the following components:      Result Value   Glucose, Bld 469 (*)    Creatinine, Ser 1.02 (*)    All other components within normal limits  BASIC METABOLIC PANEL - Abnormal; Notable for the following components:   Glucose, Bld 262 (*)    All other components within normal limits  CBG MONITORING, ED - Abnormal; Notable for the following components:   Glucose-Capillary 375 (*)    All other components within normal limits  CBG MONITORING, ED - Abnormal; Notable for the following components:   Glucose-Capillary 261 (*)    All other components within normal limits  CBG MONITORING, ED - Abnormal; Notable for the following components:   Glucose-Capillary 267 (*)    All other components within normal limits  CBG MONITORING, ED - Abnormal; Notable for the following components:   Glucose-Capillary 396 (*)    All other components within normal limits  TROPONIN I (HIGH SENSITIVITY) - Abnormal; Notable for the following components:   Troponin I (High Sensitivity) 30 (*)    All other components within normal limits  TROPONIN I (HIGH SENSITIVITY) - Abnormal; Notable for the following components:   Troponin I (High Sensitivity) 30 (*)    All other components within normal limits  TROPONIN I (HIGH SENSITIVITY) - Abnormal; Notable for the following components:   Troponin I (High Sensitivity) 33 (*)    All other components within normal limits  TROPONIN I (  HIGH SENSITIVITY) - Abnormal; Notable for the following components:   Troponin I  (High Sensitivity) 32 (*)    All other components within normal limits  SARS CORONAVIRUS 2 (TAT 6-24 HRS)  CBC WITH DIFFERENTIAL/PLATELET  D-DIMER, QUANTITATIVE  TSH    EKG EKG Interpretation  Date/Time:  Wednesday October 29 2020 13:17:36 EDT Ventricular Rate:  89 PR Interval:  162 QRS Duration: 80 QT Interval:  360 QTC Calculation: 438 R Axis:   37 Text Interpretation: Normal sinus rhythm Nonspecific ST and T wave abnormality No significant change since last tracing Abnormal ECG Confirmed by Carmin Muskrat [phone]) on 10/29/2020 1:32:58 PM   Radiology DG Chest 2 View  Result Date: 10/29/2020 CLINICAL DATA:  Chest pain tachycardia EXAM: CHEST - 2 VIEW COMPARISON:  October 16, 2017 FINDINGS: The heart size and mediastinal contours are within normal limits. No focal consolidation. No pleural effusion. No pneumothorax. The visualized skeletal structures are unremarkable. IMPRESSION: No active cardiopulmonary disease. Electronically Signed   By: Dahlia Bailiff MD   On: 10/29/2020 20:32    Procedures Procedures   Medications Ordered in ED Medications  acetaminophen (TYLENOL) tablet 650 mg (has no administration in time range)  ondansetron (ZOFRAN) injection 4 mg (has no administration in time range)  enoxaparin (LOVENOX) injection 40 mg (40 mg Subcutaneous Given 10/30/20 0052)  insulin aspart (novoLOG) injection 0-5 Units (3 Units Subcutaneous Given 10/30/20 0052)  insulin aspart (novoLOG) injection 0-9 Units (9 Units Subcutaneous Given 10/30/20 0808)  amLODipine (NORVASC) tablet 10 mg (10 mg Oral Given 10/30/20 0940)  simvastatin (ZOCOR) tablet 20 mg (has no administration in time range)  metoprolol succinate (TOPROL-XL) 24 hr tablet 100 mg (100 mg Oral Given 10/30/20 0940)  insulin aspart protamine- aspart (NOVOLOG MIX 70/30) injection 20 Units (20 Units Subcutaneous Given 10/30/20 0809)  sodium chloride 0.9 % bolus 500 mL (0 mLs Intravenous Stopped 10/29/20 2113)  insulin aspart  (novoLOG) injection 5 Units (5 Units Intravenous Given 10/29/20 1956)    ED Course  I have reviewed the triage vital signs and the nursing notes.  Pertinent labs & imaging results that were available during my care of the patient were reviewed by me and considered in my medical decision making (see chart for details).    MDM Rules/Calculators/A&P                          60 year old female with a history of diabetes, hypertension, hyperlipidemia, obesity, presents with concern for heart racing, fatigue, chest pain and dyspnea a few days ago, and lab abnormality from her primary care doctor's office today.  EKG was evaluated by me and showed nonspecific ST and T wave changes which are not changed from previous.  Chest x-ray was evaluated showing no sign of pneumonia, pulmonary edema, or pneumothorax.  D-dimer tested and negative.   Delta troponins are both 30.  She has a heart score of 6, and given troponin elevation, feel admission for observation is most appropriate.  It is possible that the episode of tachycardia that she described may have represented a cardiac arrhythmia, however given her significant symptoms during the episode and troponin elevation at this time, feel continued observation and cardiac evaluation is appropriate.    Final Clinical Impression(s) / ED Diagnoses Final diagnoses:  Chest pain, unspecified type  Palpitations  Elevated troponin    Rx / DC Orders ED Discharge Orders    None       Gareth Morgan, MD 10/30/20  1123  

## 2020-10-29 NOTE — ED Triage Notes (Signed)
Pt here from home with c/o cp and tachycardia from 3 days ago , no complaints at present ,, pt  Was told to come to hospital for an abnormal lab ??

## 2020-10-30 ENCOUNTER — Observation Stay (HOSPITAL_BASED_OUTPATIENT_CLINIC_OR_DEPARTMENT_OTHER): Payer: BC Managed Care – PPO

## 2020-10-30 DIAGNOSIS — R079 Chest pain, unspecified: Secondary | ICD-10-CM

## 2020-10-30 DIAGNOSIS — R778 Other specified abnormalities of plasma proteins: Secondary | ICD-10-CM

## 2020-10-30 DIAGNOSIS — R9431 Abnormal electrocardiogram [ECG] [EKG]: Secondary | ICD-10-CM

## 2020-10-30 DIAGNOSIS — R002 Palpitations: Secondary | ICD-10-CM | POA: Diagnosis not present

## 2020-10-30 DIAGNOSIS — N179 Acute kidney failure, unspecified: Secondary | ICD-10-CM

## 2020-10-30 DIAGNOSIS — R7989 Other specified abnormal findings of blood chemistry: Secondary | ICD-10-CM

## 2020-10-30 LAB — TROPONIN I (HIGH SENSITIVITY)
Troponin I (High Sensitivity): 32 ng/L — ABNORMAL HIGH (ref <18)
Troponin I (High Sensitivity): 33 ng/L — ABNORMAL HIGH (ref <18)

## 2020-10-30 LAB — SARS CORONAVIRUS 2 (TAT 6-24 HRS): SARS Coronavirus 2: NEGATIVE

## 2020-10-30 LAB — BASIC METABOLIC PANEL
Anion gap: 10 (ref 5–15)
BUN: 15 mg/dL (ref 6–20)
CO2: 28 mmol/L (ref 22–32)
Calcium: 9.6 mg/dL (ref 8.9–10.3)
Chloride: 100 mmol/L (ref 98–111)
Creatinine, Ser: 0.85 mg/dL (ref 0.44–1.00)
GFR, Estimated: >60 mL/min (ref >60)
Glucose, Bld: 262 mg/dL — ABNORMAL HIGH (ref 70–99)
Potassium: 3.5 mmol/L (ref 3.5–5.1)
Sodium: 138 mmol/L (ref 135–145)

## 2020-10-30 LAB — ECHOCARDIOGRAM COMPLETE
Area-P 1/2: 3.15 cm2
S' Lateral: 2.5 cm

## 2020-10-30 LAB — CBG MONITORING, ED
Glucose-Capillary: 267 mg/dL — ABNORMAL HIGH (ref 70–99)
Glucose-Capillary: 316 mg/dL — ABNORMAL HIGH (ref 70–99)
Glucose-Capillary: 396 mg/dL — ABNORMAL HIGH (ref 70–99)

## 2020-10-30 LAB — TSH: TSH: 1.881 u[IU]/mL (ref 0.350–4.500)

## 2020-10-30 MED ORDER — PERFLUTREN LIPID MICROSPHERE
1.0000 mL | INTRAVENOUS | Status: DC | PRN
Start: 2020-10-30 — End: 2020-10-30
  Administered 2020-10-30: 2 mL via INTRAVENOUS
  Filled 2020-10-30: qty 10

## 2020-10-30 NOTE — ED Notes (Signed)
Pt informed that she now as a room upstairs, pt states that she was told by provider that she would be going home shortly and have her echo rescheduled as outpatient Provider paged for update on plan of care

## 2020-10-30 NOTE — Progress Notes (Incomplete)
Subjective:   Shannon Oliver denies any acute complaints this AM, including SOB, chest pain or palpitations.   She states the past few months she has been stressed with her shoulder injury. We recommended she follow up outpatient with Cardiology for consideration of long term cardiac monitoring to rule out arrhythmia.   Objective:  Vital signs in last 24 hours: Vitals:   10/30/20 0130 10/30/20 0352 10/30/20 0654 10/30/20 0655  BP:  127/78  (!) 149/96  Pulse: 78 79 88 83  Resp:  18 18 18   Temp:   97.9 F (36.6 C) 97.9 F (36.6 C)  TempSrc:   Oral Oral  SpO2: 100% 97% 97% 96%   ***  Assessment/Plan:  Principal Problem:   Chest pain Active Problems:   Hypertension   DM (diabetes mellitus) ([facility])   Elevated troponin   AKI (acute kidney injury) ([place])  Shannon Oliver is a 60 y.o. with pertinent PMH of  hypertension, hyperlipidemia, type 2 diabetes, atypical chest pain, palpitations, recent left shoulder surgery (08/2020) who presented with palpitations, chest pain and admitted for cardiac work-up and observation on hospital day 0  Palpitations Tachycardia Atypical chest pain Back pain Patient with 2 days of palpitations, tachycardia, chest pain as well as back pain that onset while she was at rest and persisted for 2 days straight.  Persisted 2 days, asymptomatic at this time.  Since admission vital signs have been stable without oxygen supplementation needed. Cardiac work-up thus far has been reassuring; EKG unchanged, troponins flat at 30, symptoms resolved without intervention, patient in normal sinus rhythm.  D-dimer negative, low suspicion for PE.  Back pain does raise a question of dissection however patient's blood pressure has been stable and at baseline.  She was hypertensive we are talking with her, but we will continue to monitor and restart home meds.  Patient with full cardiology work-up as per above, by [facility] cardiology.  Per their documentation her  symptoms were associated with normal sinus rhythm she did have 1 short run of V. tach and rare PVCs but patient was on beta-blocker so no further testing was indicated at that time.  She does not continue to follow-up with them due to insurance issues.  She denies missing metoprolol doses recently.  Will observe patient overnight, trend troponins and monitor on telemetry.  Zio patch reading showed palpitations associated with NSR, one short run of VT and PVCs, per notes from 06/04/2019 Cardiology visit. ***  ***  -Continue telemetry monitoring -Trend troponins until persistently flat or down-trending -TSH pending -echo today per primary -Continue home metoprolol -Acute pain or change in vital signs, consider further imaging rule out dissection. -Consider sending home on zio patch -Follow-up with PCP/cardiology on outpatient basis if work up unremarkable.  HTN Baseline BP 150s/90s. -held losartan in setting of AKI -continue amlodipine  HLD Lipid panel 1 month ago, cholesterol 163. LDL 78.  Triglycerides 944, however uncertain if these were fasting lipid panel. -Continue rosuvastatin -Follow-up with PCP for elevated triglycerides  Type II DM A1c 1 month ago 10.3.  Home medication of insulin 70/30, metformin, Farxiga.  Notes that she is felt down has not felt like working on her health recently. -SSI while admitted -restart 70/30  Urinary frequency Endorses increased urination since starting Farxiga.  No suspicion for infection at this time. -Continue to monitor  OSA Patient uses CPAP at home, sleep study done in 2020 at [facility] CPAP during hospitalization, nightly  Symptoms  of depression Follow up PCP   Prior to Admission Living Arrangement: Anticipated Discharge Location: Barriers to Discharge: Dispo: Anticipated discharge in approximately *** day(s).   ***

## 2020-10-30 NOTE — Progress Notes (Signed)
  Echocardiogram 2D Echocardiogram with defintiy has been performed.  Darlina Sicilian M 10/30/2020, 3:22 PM

## 2020-10-30 NOTE — Discharge Summary (Addendum)
Name: Shannon Oliver MRN: [id number] DOB: [birth year] 60 y.o. PCP: Department, [facility]  Date of Admission: 10/29/2020  1:12 PM Date of Discharge:  10/30/2020 Attending Physician: Shannon Ochs, MD   Discharge Diagnosis: Atypical chest pain Palpitations  Discharge Medications: Allergies as of 10/30/2020       Reactions   Canagliflozin    Other reaction(s): dizziness, stabbing pain in right side of body   Hydrocodone Hypertension        Medication List     TAKE these medications    acetaminophen 500 MG tablet Commonly known as: TYLENOL Take 500-1,000 mg by mouth every 6 (six) hours as needed for mild pain.   amLODipine 10 MG tablet Commonly known as: NORVASC TAKE 1 TABLET EVERY DAY ONCE A DAY ORALLY 90 What changed:  how much to take how to take this when to take this additional instructions   clotrimazole 1 % cream Commonly known as: LOTRIMIN Apply 1 application topically in the morning and at bedtime.   cyclobenzaprine 10 MG tablet Commonly known as: FLEXERIL Take 10 mg by mouth every 6 (six) hours as needed for muscle spasms.   dapagliflozin propanediol 5 MG Tabs tablet Commonly known as: Farxiga Take 1 tablet (5 mg total) by mouth daily.   Dexcom G6 Sensor Misc Use to monitor blood sugar, change after 10 days   gabapentin 300 MG capsule Commonly known as: NEURONTIN Take 300 mg by mouth 2 (two) times daily.   glucose blood test strip Commonly known as: Accu-Chek Guide Use as instructed to check blood sugar one time daily.   Insulin Syringe-Needle U-100 30G X 1/2" 0.3 ML Misc Use twice a day with insulin   Klor-Con M20 20 MEQ tablet Generic drug: potassium chloride SA TAKE 1 TABLET BY MOUTH EVERY DAY What changed: how much to take   loratadine 10 MG tablet Commonly known as: CLARITIN Take 10 mg by mouth daily as needed for allergies.   losartan-hydrochlorothiazide 100-25 MG tablet Commonly known as: HYZAAR Take 1 tablet  by mouth every morning.   metFORMIN 1000 MG tablet Commonly known as: GLUCOPHAGE Take 1,000 mg by mouth 2 (two) times daily.   methocarbamol 500 MG tablet Commonly known as: ROBAXIN Take 1 tablet (500 mg total) by mouth 2 (two) times daily.   metoprolol succinate 100 MG 24 hr tablet Commonly known as: TOPROL-XL Take 100 mg by mouth daily.   naproxen 500 MG tablet Commonly known as: NAPROSYN Take 500 mg by mouth 2 (two) times daily as needed for moderate pain.   NovoLIN 70/30 FlexPen (70-30) 100 UNIT/ML KwikPen Generic drug: insulin isophane & regular human Inject 30 Units into the skin in the morning and at bedtime.   ondansetron 4 MG tablet Commonly known as: ZOFRAN Take 4 mg by mouth every 6 (six) hours as needed for vomiting or nausea.   ONE-A-DAY WOMENS PO Take 1 tablet by mouth daily.   rosuvastatin 20 MG tablet Commonly known as: CRESTOR Take 20 mg by mouth at bedtime.   tiZANidine 2 MG tablet Commonly known as: ZANAFLEX Take 2 mg by mouth every 12 (twelve) hours as needed for muscle pain.   traMADol 50 MG tablet Commonly known as: ULTRAM Take 50 mg by mouth every 6 (six) hours as needed for moderate pain.        Disposition and follow-up:   ShannonShannon Oliver was discharged from [facility] in Good condition.  At the hospital follow up visit please  address:  1.  Follow up: Chest pain: Reassuring work-up here with minimal troponin elevation, echo conducted but pending results to be followed up outpatient Palpitations: Apparently had a Zio patch placed in 2020 but results unavailable in our system and patient is unaware of what that showed, follow-up with cardiology to discuss these results Diabetes: Last A1c was poor, blood sugar elevated on admission and improved with insulin, follow-up outpatient for further control Depression: Patient reports many stressors due to being out of work with her shoulder injury, will need outpatient  follow-up  2.  Labs / imaging needed at time of follow-up: Consider further heart monitoring  3.  Pending labs/ test needing follow-up: Echocardiogram  Follow-up Appointments:  Follow-up Information     Carlis Stable Ang. Go to.   Why: You have an appointment with Dr. Edd Oliver tomorrow, 4/29, at 2pm. Please try to arrive by 1:45. Bring your ID and [employer]. Contact information: [facility] [address] [place], [place] [address] [place] [facility] Course by problem list:  # Atypical chest and palpitations, ACS ruled out Patient presented with atypical chest pain and palpitations going on over past 2 days.  She has had similar symptoms previously, most notably in 2020 had extensive cardiac work-up that included a reassuring exercise echo stress test. At that time nightly alcohol use caused her chest pain to occur, that is why she has decreased her alcohol intake.  Apparently had Zio patch placed at this time the patient does not know what the results are.  She was started on Toprol at that time.  Reported heart rate 110s to 130s at home but was abnormal rate throughout admission with no arrhythmia detected.  She had minimally elevated high-sensitivity troponin which plateaued at 32.  EKG reassuring.  D-dimer negative.  Normal TSH.  Echo conducted.  Given reassuring work-up so far and at this point low suspicion for ACS, plan to discharge. Will call with results of her echocardiogram. Outpatient cardiology follow up.  May need further monitoring for arrhythmia outpatient.  # Uncontrolled diabetes Last outpatient A1c was 10.90-month ago.  Blood sugar was 469 on admission in the setting of non compliance which improved with short acting insulin.  Outpatient regimen was recently adjusted.  Continue Farxiga, insulin 70/30, metformin and follow-up with PCP.  Subjective on day of discharge: Patient states she feels well this morning.  Chest pain and  palpitations have resolved.  Still has a mild headache which has improved.  We discussed her work-up which has been reassuring so far.  She is agreeable with plan to discharge this afternoon after obtaining echocardiogram.  She will follow-up with cardiology closely outpatient.  Appointment has been made for tomorrow at 2 PM.  Discharge Exam:   BP 113/78 (BP Location: Right Arm)   Pulse 81   Temp 99 F (37.2 C) (Oral)   Resp 15   LMP 08/02/2013 Comment: spotting  SpO2 94%  Discharge exam: Physical Exam Vitals and nursing note reviewed.  Constitutional:      Appearance: Normal appearance. She is obese.  HENT:     Head: Normocephalic and atraumatic.     Right Ear: External ear normal.     Left Ear: External ear normal.     Nose: Nose normal.     Mouth/Throat:     Mouth: Mucous membranes are moist.  Eyes:     Extraocular Movements: Extraocular  movements intact.  Cardiovascular:     Rate and Rhythm: Normal rate and regular rhythm.     Heart sounds: Normal heart sounds. No murmur heard. No friction rub. No gallop.   Pulmonary:     Effort: Pulmonary effort is normal. No respiratory distress.     Breath sounds: Normal breath sounds. No wheezing, rhonchi or rales.  Abdominal:     General: Abdomen is flat.  Musculoskeletal:        General: Normal range of motion.     Cervical back: Normal range of motion and neck supple.     Comments: Trace nonpitting edema bilaterally  Skin:    General: Skin is warm and dry.  Neurological:     General: No focal deficit present.     Mental Status: She is alert and oriented to person, place, and time.  Psychiatric:        Mood and Affect: Mood normal.        Behavior: Behavior normal.     Pertinent Labs, Studies, and Procedures:  DG Chest 2 View  Result Date: 10/29/2020 CLINICAL DATA:  Chest pain tachycardia EXAM: CHEST - 2 VIEW COMPARISON:  October 16, 2017 FINDINGS: The heart size and mediastinal contours are within normal limits. No focal  consolidation. No pleural effusion. No pneumothorax. The visualized skeletal structures are unremarkable. IMPRESSION: No active cardiopulmonary disease. Electronically Signed   By: Dahlia Bailiff MD   On: 10/29/2020 20:32   High sensitivity troponin: 30 > 30 > 33 > 32 D-dimer: 0.31 TSH: 1.881  Echocardiogram collected, interpretation pending  Discharge Instructions: Discharge Instructions     Diet - low sodium heart healthy   Complete by: As directed    Discharge instructions   Complete by: As directed    Shannon Oliver, it was a pleasure taking care of you.  You are admitted for chest pain and palpitations.  Our work-up here is reassuring.  There is no sign of a heart attack, and we did not see any arrhythmias on our monitoring.  That being said, he may require longer term monitoring to pick up possible rhythm issues.  An echocardiogram was included, I will call if anything is abnormal.  Otherwise follow-up the results with Dr. Edd Oliver tomorrow.  Here are your discharge instructions.  1.  Follow-up with cardiology, Dr. Edd Oliver, tomorrow at 2 PM in [facility].  Have him discuss your echo results from this admission, and also your prior Zio patch. 2.  Continue all your medications 3.  Follow-up with your primary care doctor about her diabetes and low mood   Increase activity slowly   Complete by: As directed        Signed: Andrew Au, MD 10/30/2020, 2:47 PM   Pager: [phone]

## 2020-11-02 ENCOUNTER — Other Ambulatory Visit: Payer: Self-pay | Admitting: Endocrinology

## 2021-01-01 ENCOUNTER — Other Ambulatory Visit: Payer: Self-pay | Admitting: Endocrinology

## 2021-01-13 ENCOUNTER — Encounter: Payer: Self-pay | Admitting: Emergency Medicine

## 2021-01-13 ENCOUNTER — Other Ambulatory Visit: Payer: Self-pay

## 2021-01-13 ENCOUNTER — Ambulatory Visit
Admission: EM | Admit: 2021-01-13 | Discharge: 2021-01-13 | Disposition: A | Discharge status: Home / Self Care | Payer: BC Managed Care – PPO | Attending: Emergency Medicine | Admitting: Emergency Medicine

## 2021-01-13 DIAGNOSIS — J0100 Acute maxillary sinusitis, unspecified: Secondary | ICD-10-CM | POA: Diagnosis not present

## 2021-01-13 DIAGNOSIS — B373 Candidiasis of vulva and vagina: Secondary | ICD-10-CM

## 2021-01-13 DIAGNOSIS — B3731 Acute candidiasis of vulva and vagina: Secondary | ICD-10-CM

## 2021-01-13 DIAGNOSIS — H1032 Unspecified acute conjunctivitis, left eye: Secondary | ICD-10-CM

## 2021-01-13 MED ORDER — POLYMYXIN B-TRIMETHOPRIM 10000-0.1 UNIT/ML-% OP SOLN: 1.0000 [drp] | Freq: Four times a day (QID) | OPHTHALMIC | 0 refills | Status: AC

## 2021-01-13 MED ORDER — AMOXICILLIN-POT CLAVULANATE 875-125 MG PO TABS: 1.0000 | ORAL_TABLET | Freq: Two times a day (BID) | ORAL | 0 refills | Status: AC

## 2021-01-13 MED ORDER — BENZONATATE 200 MG PO CAPS: 200.0000 mg | ORAL_CAPSULE | Freq: Three times a day (TID) | ORAL | 0 refills | Status: AC | PRN

## 2021-01-13 MED ORDER — FLUCONAZOLE 150 MG PO TABS: 150.0000 mg | ORAL_TABLET | Freq: Once | ORAL | 0 refills | Status: AC

## 2021-01-13 MED ORDER — DM-GUAIFENESIN ER 30-600 MG PO TB12: 1.0000 | ORAL_TABLET | Freq: Two times a day (BID) | ORAL | 0 refills | Status: DC

## 2021-01-13 NOTE — Discharge Instructions (Addendum)
Begin Augmentin twice daily x1 week to treat sinus infection Polytrim eyedrops 1 to 2 drops in eye 4 times daily x1 week Warm compresses to eye Mucinex DM twice daily for cough/congestion-may get over-the-counter if cheaper Tessalon every 8 hours for cough  1 tab of Diflucan today for yeast infection, may repeat after completing course of antibiotics  Please return if any symptoms not improving or worsening

## 2021-01-13 NOTE — ED Triage Notes (Signed)
Patient presentys to [facility] for evaluation of persistent yeast infection with [place].  Also c/o scratchy throat, cough, and sinus pressure since Thursday, has been resolving, but her left eye crusted over last night and she is having pain to the left side of her nose.

## 2021-01-13 NOTE — ED Provider Notes (Signed)
[facility]    CSN: [id number] Arrival date & time: 01/13/21  4270      History   Chief Complaint Chief Complaint  Patient presents with   Cough   Ear Pain   Vaginitis    HPI LASHANNA ANGELO is a 60 y.o. female history of DM, hypertension, presenting today for evaluation of URI symptoms and yeast infection.  Reports that she has had cough, congestion, rhinorrhea, sore throat as well as some eye irritation.  Symptoms began approximately 5 to 6 days ago, but recent worsening causing left-sided sinus pressure as well as left eye redness irritation and drainage.  Occasional blurring.  Denies eye pain.  Denies contact use.  Also reports concern over persistent yeast infection over the past few weeks.  Believes this is related to use of Wilder Glade, stopped taking [place] approximately 1 week ago.  Reports sugars to around 200 this morning.  She was using over-the-counter yeast medicine without full relief.  HPI  Past Medical History:  Diagnosis Date   Abnormal EKG    LVH with strain   Acid reflux    Depression    Diabetes mellitus    A1C over 9   HLD (hyperlipidemia)    Hypertension    Noncompliance    Obesity     Patient Active Problem List   Diagnosis Date Noted   Elevated troponin 10/30/2020   AKI (acute kidney injury) ([place]) 10/30/2020   Palpitations    Chest pain 10/29/2020   Postmenopausal bleeding 06/05/2017   Neck pain 03/01/2014   Edema 01/25/2013   DM (diabetes mellitus) ([place]) 01/25/2013   Dyspnea 01/25/2013   Abnormal ECG 01/25/2013   Hypertension    Acid reflux     Past Surgical History:  Procedure Laterality Date   COLONOSCOPY WITH PROPOFOL N/A 04/29/2015   Procedure: COLONOSCOPY WITH PROPOFOL;  Surgeon: Garlan Fair, MD;  Location: [facility];  Service: Endoscopy;  Laterality: N/A;   HYSTEROSCOPY WITH D & C N/A 09/07/2017   Procedure: DILATATION AND CURETTAGE /HYSTEROSCOPY;  Surgeon: Sloan Leiter, MD;  Location: [place];  Service: Gynecology;  Laterality: N/A;   KNEE ARTHROSCOPY W/ MENISCAL REPAIR Right     OB History     Gravida  3   Para  3   Term  3   Preterm  0   AB  0   Living  3      SAB  0   IAB  0   Ectopic  0   Multiple      Live Births  3            Home Medications    Prior to Admission medications   Medication Sig Start Date End Date Taking? Authorizing Provider  amoxicillin-clavulanate (AUGMENTIN) 875-125 MG tablet Take 1 tablet by mouth every 12 (twelve) hours for 7 days. 01/13/21 01/20/21 Yes Ellisa Devivo C, PA-C  benzonatate (TESSALON) 200 MG capsule Take 1 capsule (200 mg total) by mouth 3 (three) times daily as needed for up to 7 days for cough. 01/13/21 01/20/21 Yes Leeon Makar C, PA-C  dextromethorphan-guaiFENesin (MUCINEX DM) 30-600 MG 12hr tablet Take 1 tablet by mouth 2 (two) times daily. 01/13/21  Yes Nicosha Struve C, PA-C  fluconazole (DIFLUCAN) 150 MG tablet Take 1 tablet (150 mg total) by mouth once for 1 dose. May repeat in 72 hours or after completion of antibiotic 01/13/21 01/13/21 Yes Arcadia Gorgas, [place] C, PA-C  trimethoprim-polymyxin b (POLYTRIM) ophthalmic solution  Place 1-2 drops into the left eye every 6 (six) hours for 7 days. 01/13/21 01/20/21 Yes Zakayla Martinec C, PA-C  acetaminophen (TYLENOL) 500 MG tablet Take 500-1,000 mg by mouth every 6 (six) hours as needed for mild pain.    [provider]  amLODipine (NORVASC) 10 MG tablet TAKE 1 TABLET EVERY DAY ONCE A DAY ORALLY 90 Patient taking differently: Take 10 mg by mouth daily. 03/13/18   Elayne Snare, MD  clotrimazole (LOTRIMIN) 1 % cream Apply 1 application topically in the morning and at bedtime. 10/29/20   [provider]  Continuous Blood Gluc Sensor (DEXCOM G6 SENSOR) MISC Use to monitor blood sugar, change after 10 days 09/08/20   Elayne Snare, MD  cyclobenzaprine (FLEXERIL) 10 MG tablet Take 10 mg by mouth every 6 (six) hours as needed for muscle spasms. 08/27/20    [provider]  FARXIGA 5 MG TABS tablet TAKE 1 TABLET (5 MG TOTAL) BY MOUTH DAILY. 01/01/21   Elayne Snare, MD  gabapentin (NEURONTIN) 300 MG capsule Take 300 mg by mouth 2 (two) times daily. 09/05/20   [provider]  glucose blood (ACCU-CHEK GUIDE) test strip Use as instructed to check blood sugar one time daily. 11/16/17   Elayne Snare, MD  Insulin Syringe-Needle U-100 30G X 1/2" 0.3 ML MISC Use twice a day with insulin 10/17/17   Elayne Snare, MD  KLOR-CON M20 20 MEQ tablet TAKE 1 TABLET BY MOUTH EVERY DAY Patient taking differently: Take 20 mEq by mouth daily. 10/01/20   Philemon Kingdom, MD  loratadine (CLARITIN) 10 MG tablet Take 10 mg by mouth daily as needed for allergies.    [provider]  losartan-hydrochlorothiazide (HYZAAR) 100-25 MG per tablet Take 1 tablet by mouth every morning.     [provider]  metFORMIN (GLUCOPHAGE) 1000 MG tablet Take 1,000 mg by mouth 2 (two) times daily. 03/18/20   [provider]  methocarbamol (ROBAXIN) 500 MG tablet Take 1 tablet (500 mg total) by mouth 2 (two) times daily. 03/23/20   Alroy Bailiff, [place], PA-C  metoprolol succinate (TOPROL-XL) 100 MG 24 hr tablet Take 100 mg by mouth daily.  09/14/17   [provider]  Multiple Vitamins-Calcium (ONE-A-DAY WOMENS PO) Take 1 tablet by mouth daily.    [provider]  naproxen (NAPROSYN) 500 MG tablet Take 500 mg by mouth 2 (two) times daily as needed for moderate pain. 08/19/20   [provider]  NOVOLIN 70/30 FLEXPEN (70-30) 100 UNIT/ML KwikPen Inject 30 Units into the skin in the morning and at bedtime. 03/18/20   [provider]  ondansetron (ZOFRAN) 4 MG tablet Take 4 mg by mouth every 6 (six) hours as needed for vomiting or nausea. 08/19/20   [provider]  rosuvastatin (CRESTOR) 20 MG tablet Take 20 mg by mouth at bedtime. 10/20/20   [provider]  tiZANidine (ZANAFLEX) 2 MG tablet Take 2 mg by mouth every 12  (twelve) hours as needed for muscle pain. 02/22/20   [provider]  traMADol (ULTRAM) 50 MG tablet Take 50 mg by mouth every 6 (six) hours as needed for moderate pain.    [provider]    Family History Family History  Problem Relation Age of Onset   Cancer Mother    Hypertension Mother    Diabetes Mother     Social History Social History   Tobacco Use   Smoking status: Never   Smokeless tobacco: Never  Vaping Use   Vaping  Use: Never used  Substance Use Topics   Alcohol use: Not Currently    Comment: occ   Drug use: No     Allergies   Canagliflozin and Hydrocodone   Review of Systems Review of Systems  Constitutional:  Negative for activity change, appetite change, chills, fatigue and fever.  HENT:  Positive for congestion, rhinorrhea and sore throat. Negative for ear pain, sinus pressure and trouble swallowing.   Eyes:  Positive for redness and itching. Negative for discharge.  Respiratory:  Positive for cough. Negative for chest tightness and shortness of breath.   Cardiovascular:  Negative for chest pain.  Gastrointestinal:  Negative for abdominal pain, diarrhea, nausea and vomiting.  Genitourinary:  Positive for vaginal discharge.  Musculoskeletal:  Negative for myalgias.  Skin:  Negative for rash.  Neurological:  Negative for dizziness, light-headedness and headaches.    Physical Exam Triage Vital Signs ED Triage Vitals  Enc Vitals Group     BP      Pulse      Resp      Temp      Temp src      SpO2      Weight      Height      Head Circumference      Peak Flow      Pain Score      Pain Loc      Pain Edu?      Excl. in [place]?    No data found.  Updated Vital Signs BP 122/85 (BP Location: Left Arm)   Pulse 86   Temp 98.3 F (36.8 C) (Oral)   Resp 18   LMP 08/02/2013 Comment: spotting  SpO2 95%   Visual Acuity Right Eye Distance:   Left Eye Distance:   Bilateral Distance:    Right Eye Near:   Left Eye Near:     Bilateral Near:     Physical Exam Vitals and nursing note reviewed.  Constitutional:      Appearance: She is well-developed.     Comments: No acute distress  HENT:     Head: Normocephalic and atraumatic.     Ears:     Comments: Bilateral ears without tenderness to palpation of external auricle, tragus and mastoid, EAC's without erythema or swelling, TM's with good bony landmarks and cone of light. Non erythematous.      Nose: Nose normal.     Mouth/Throat:     Comments: Oral mucosa pink and moist, no tonsillar enlargement or exudate. Posterior pharynx patent and nonerythematous, no uvula deviation or swelling. Normal phonation.  Eyes:     Extraocular Movements: Extraocular movements intact.     Pupils: Pupils are equal, round, and reactive to light.     Comments: Left eye with minimal periorbital swelling without erythema, conjunctiva appears slightly erythematous with small amount of drainage noted, frequently watering  Cardiovascular:     Rate and Rhythm: Normal rate.  Pulmonary:     Effort: Pulmonary effort is normal. No respiratory distress.     Comments: Breathing comfortably at rest, CTABL, no wheezing, rales or other adventitious sounds auscultated  Abdominal:     General: There is no distension.  Musculoskeletal:        General: Normal range of motion.     Cervical back: Neck supple.  Skin:    General: Skin is warm and dry.  Neurological:     Mental Status: She is alert and oriented to person, place, and time.  UC Treatments / Results  Labs (all labs ordered are listed, but only abnormal results are displayed) Labs Reviewed - No data to display  EKG   Radiology No results found.  Procedures Procedures (including critical care time)  Medications Ordered in UC Medications - No data to display  Initial Impression / Assessment and Plan / UC Course  I have reviewed the triage vital signs and the nursing notes.  Pertinent labs & imaging results that  were available during my care of the patient were reviewed by me and considered in my medical decision making (see chart for details).     Treating for sinusitis given symptoms approximately 1 week with recent worsening and now with associated bacterial conjunctivitis-treating with Augmentin, Polytrim for conjunctivitis, warm compresses.  Continue symptomatic and supportive care of cough and congestion.  Yeast infection-2 tabs of Diflucan provided, encourage patient to restart Wilder Glade, continue to monitor sugars and contact PCP for concerns regarding this medicine.  Discussed strict return precautions. Patient verbalized understanding and is agreeable with plan.  Final Clinical Impressions(s) / UC Diagnoses   Final diagnoses:  Acute non-recurrent maxillary sinusitis  Acute bacterial conjunctivitis of left eye  Yeast vaginitis     Discharge Instructions      Begin Augmentin twice daily x1 week to treat sinus infection Polytrim eyedrops 1 to 2 drops in eye 4 times daily x1 week Warm compresses to eye Mucinex DM twice daily for cough/congestion-may get over-the-counter if cheaper Tessalon every 8 hours for cough  1 tab of Diflucan today for yeast infection, may repeat after completing course of antibiotics  Please return if any symptoms not improving or worsening     ED Prescriptions     Medication Sig Dispense Auth. Provider   fluconazole (DIFLUCAN) 150 MG tablet Take 1 tablet (150 mg total) by mouth once for 1 dose. May repeat in 72 hours or after completion of antibiotic 2 tablet Hatsue Sime C, PA-C   amoxicillin-clavulanate (AUGMENTIN) 875-125 MG tablet Take 1 tablet by mouth every 12 (twelve) hours for 7 days. 14 tablet Taniaya Rudder C, PA-C   dextromethorphan-guaiFENesin (MUCINEX DM) 30-600 MG 12hr tablet Take 1 tablet by mouth 2 (two) times daily. 14 tablet Jameelah Watts C, PA-C   benzonatate (TESSALON) 200 MG capsule Take 1 capsule (200 mg total) by mouth 3  (three) times daily as needed for up to 7 days for cough. 28 capsule Khadar Monger C, PA-C   trimethoprim-polymyxin b (POLYTRIM) ophthalmic solution Place 1-2 drops into the left eye every 6 (six) hours for 7 days. 10 mL Tasnim Balentine, [place] C, PA-C      PDMP not reviewed this encounter.   Janith Lima, [place] 01/13/21 [phone]

## 2021-03-25 ENCOUNTER — Other Ambulatory Visit: Payer: Self-pay | Admitting: Internal Medicine

## 2021-03-25 DIAGNOSIS — Z1231 Encounter for screening mammogram for malignant neoplasm of breast: Secondary | ICD-10-CM

## 2021-03-27 ENCOUNTER — Other Ambulatory Visit: Payer: BC Managed Care – PPO

## 2021-03-27 ENCOUNTER — Other Ambulatory Visit (INDEPENDENT_AMBULATORY_CARE_PROVIDER_SITE_OTHER): Payer: BC Managed Care – PPO

## 2021-03-27 DIAGNOSIS — Z794 Long term (current) use of insulin: Secondary | ICD-10-CM | POA: Diagnosis not present

## 2021-03-27 DIAGNOSIS — E1165 Type 2 diabetes mellitus with hyperglycemia: Secondary | ICD-10-CM | POA: Diagnosis not present

## 2021-03-27 LAB — BASIC METABOLIC PANEL WITH GFR
BUN: 20 mg/dL (ref 6–23)
CO2: 25 meq/L (ref 19–32)
Calcium: 9.4 mg/dL (ref 8.4–10.5)
Chloride: 104 meq/L (ref 96–112)
Creatinine, Ser: 0.91 mg/dL (ref 0.40–1.20)
GFR: 68.78 mL/min
Glucose, Bld: 120 mg/dL — ABNORMAL HIGH (ref 70–99)
Potassium: 3.4 meq/L — ABNORMAL LOW (ref 3.5–5.1)
Sodium: 140 meq/L (ref 135–145)

## 2021-03-28 ENCOUNTER — Ambulatory Visit
Admission: RE | Admit: 2021-03-28 | Discharge: 2021-03-28 | Disposition: A | Discharge status: Home / Self Care | Payer: BC Managed Care – PPO | Source: Ambulatory Visit | Attending: Internal Medicine | Admitting: Internal Medicine

## 2021-03-28 DIAGNOSIS — Z1231 Encounter for screening mammogram for malignant neoplasm of breast: Secondary | ICD-10-CM

## 2021-03-28 LAB — FRUCTOSAMINE: Fructosamine: 431 umol/L — ABNORMAL HIGH (ref 0–285)

## 2021-03-28 IMAGING — MG MM DIGITAL SCREENING BILAT W/ TOMO AND CAD
8 series · 8 of 24 positions shown · non-contrast
Comparison: Previous exam(s).

ACR Breast Density Category a: The breast tissue is almost entirely
fatty.

CLINICAL DATA: Screening.

EXAM:
DIGITAL SCREENING BILATERAL MAMMOGRAM WITH TOMOSYNTHESIS AND CAD
TECHNIQUE: Bilateral screening digital craniocaudal and mediolateral oblique
mammograms were obtained. Bilateral screening digital breast
tomosynthesis was performed. The images were evaluated with
computer-aided detection.

[R CC synth-2D]
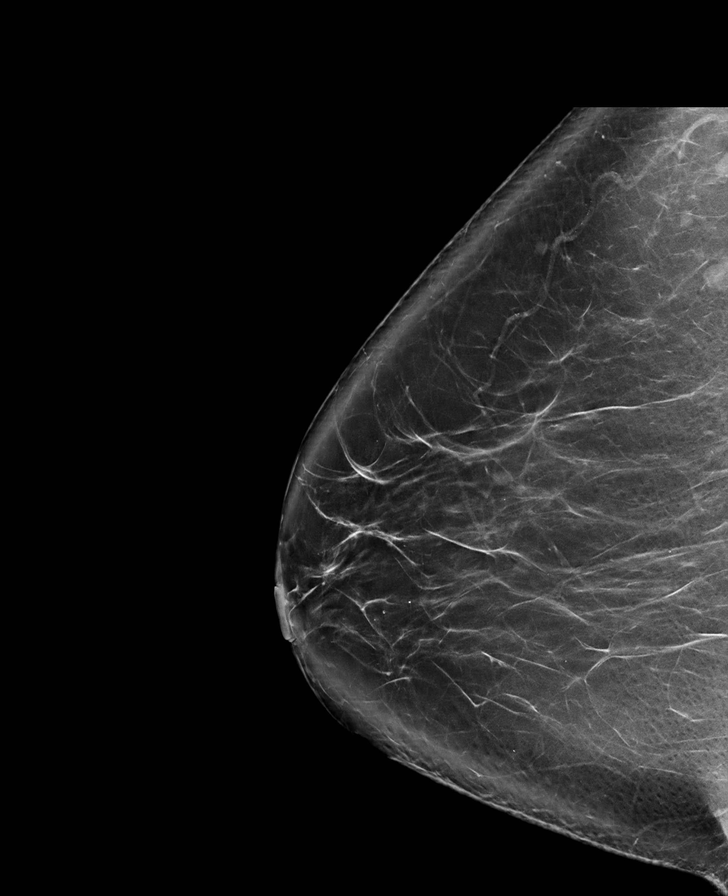

[R MLO synth-2D]
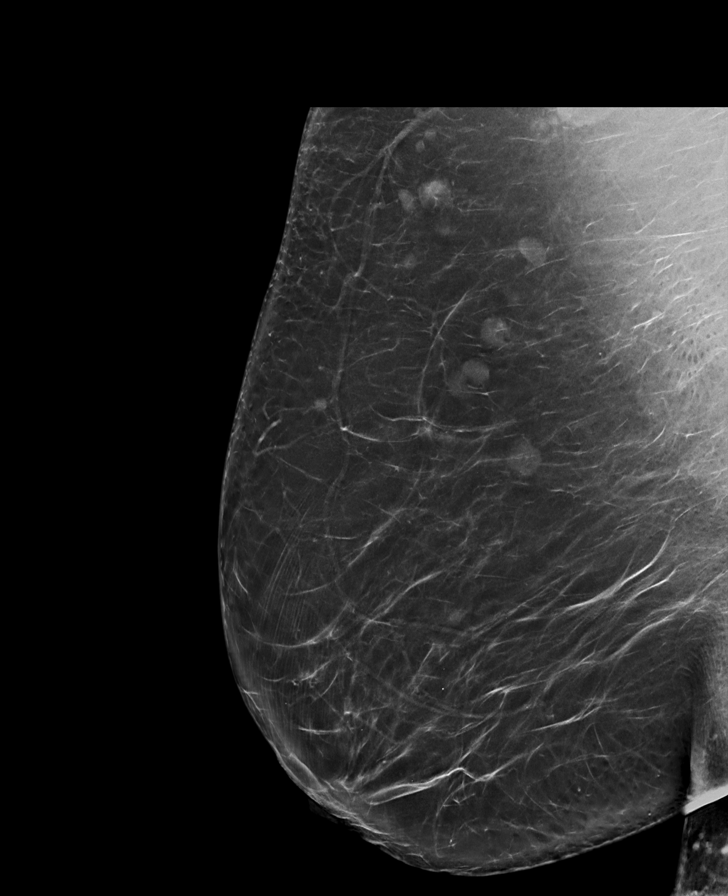

[L MLO synth-2D]
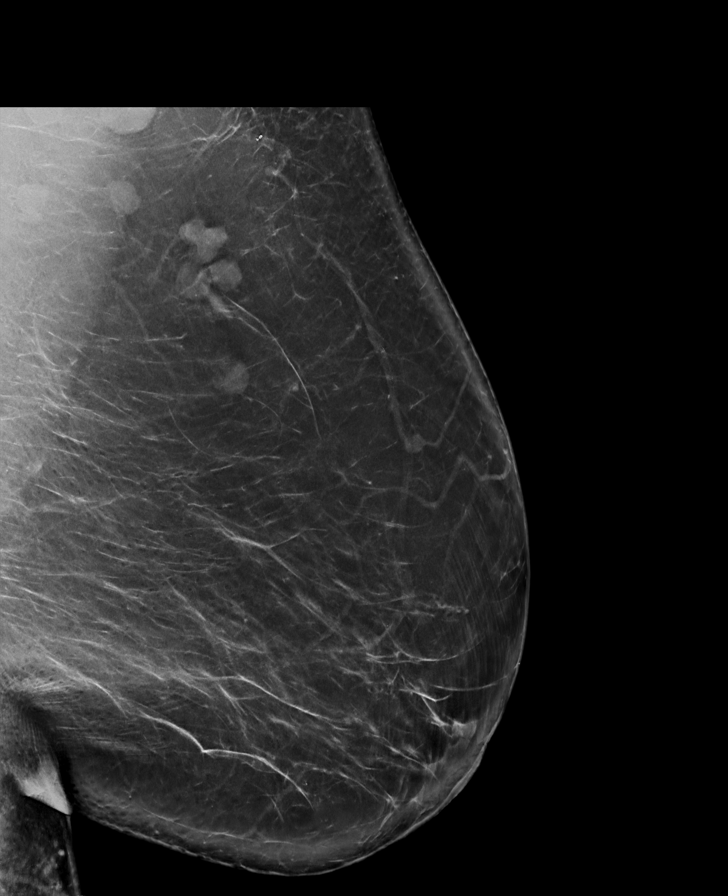

[L CC synth-2D]
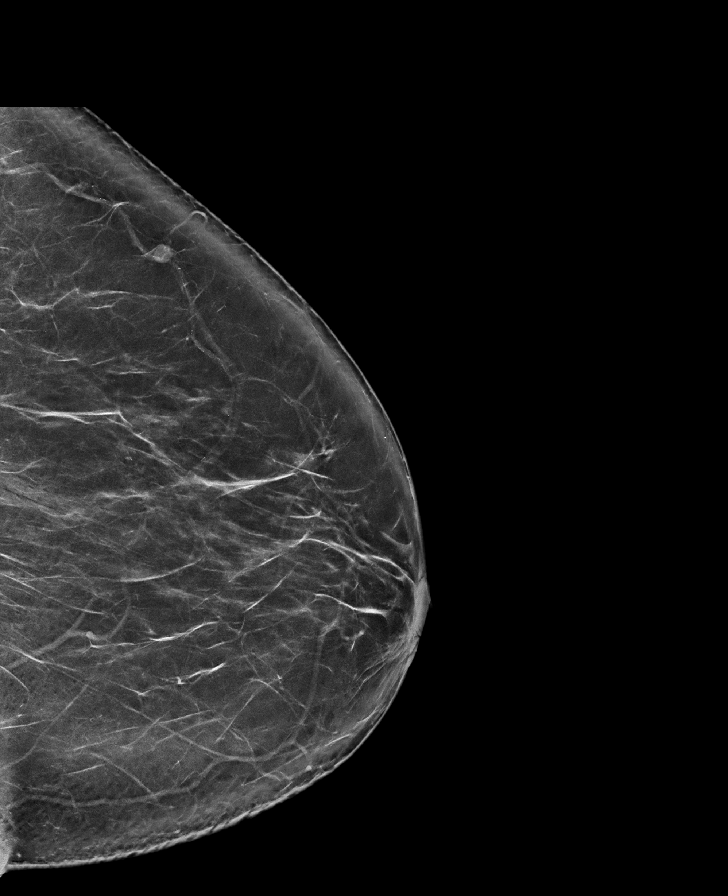

[L MLO tomo · tomo slice 55/108.0]
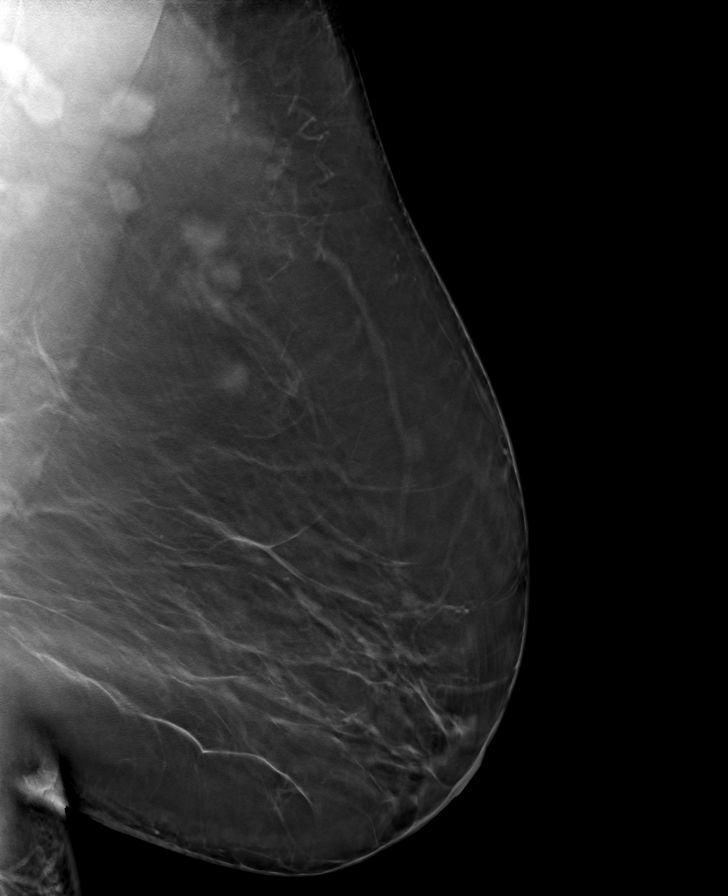

[L CC tomo · tomo slice 47/92.0]
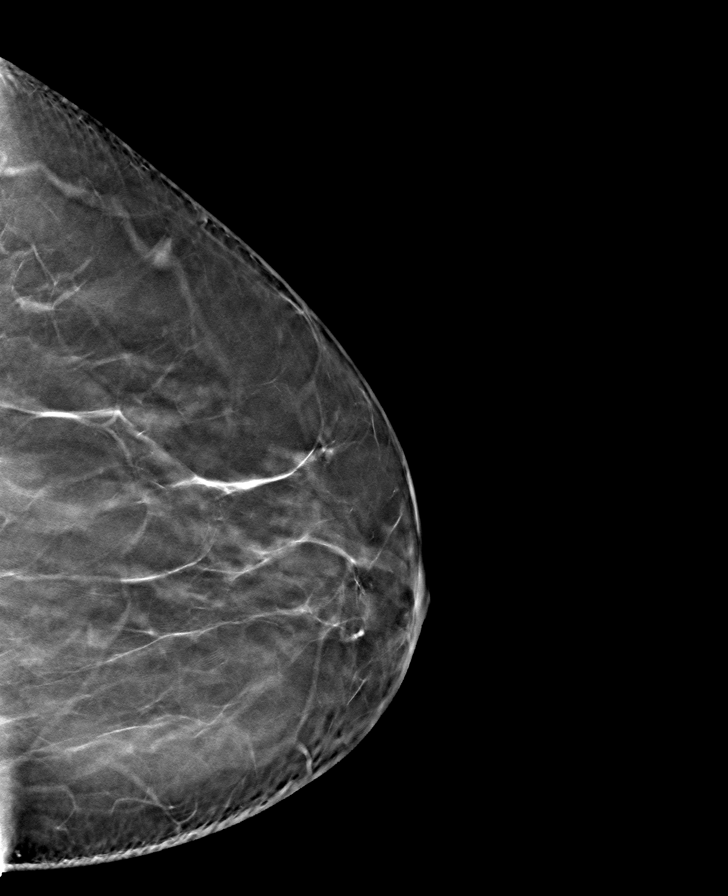

[R MLO tomo · tomo slice 51/102.0]
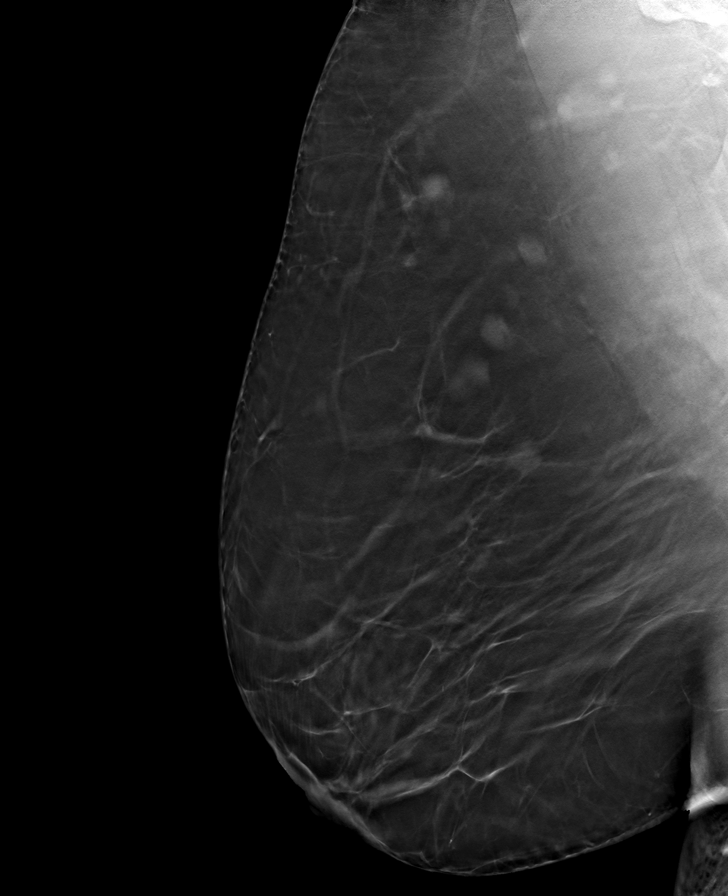

[R CC tomo · tomo slice 51/102.0]
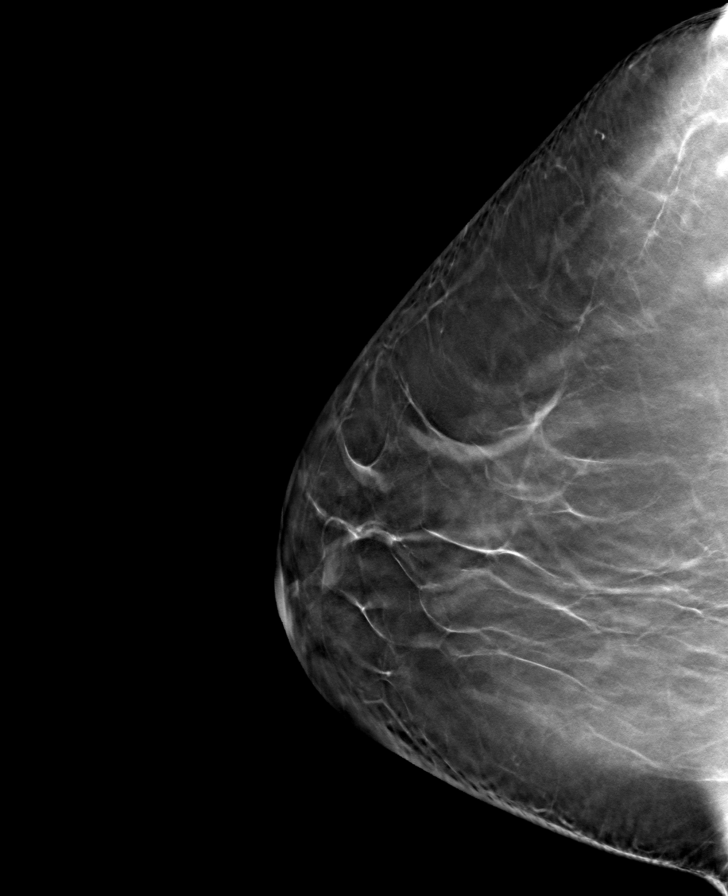

[8 of 24 positions shown; findings below may reference images not displayed]

FINDINGS: There are no findings suspicious for malignancy.
IMPRESSION: No mammographic evidence of malignancy. A result letter of this
screening mammogram will be mailed directly to the patient.

RECOMMENDATION:
Screening mammogram in one year. (Code:[8U])

BI-RADS CATEGORY  1: Negative.

## 2021-03-30 ENCOUNTER — Encounter: Payer: Self-pay | Admitting: Endocrinology

## 2021-03-30 ENCOUNTER — Other Ambulatory Visit: Payer: Self-pay

## 2021-03-30 ENCOUNTER — Ambulatory Visit: Payer: BC Managed Care – PPO | Admitting: Endocrinology

## 2021-03-30 VITALS — BP 130/82 | HR 87 | Ht 66.0 in | Wt 248.2 lb

## 2021-03-30 DIAGNOSIS — E876 Hypokalemia: Secondary | ICD-10-CM | POA: Diagnosis not present

## 2021-03-30 DIAGNOSIS — Z794 Long term (current) use of insulin: Secondary | ICD-10-CM

## 2021-03-30 DIAGNOSIS — I10 Essential (primary) hypertension: Secondary | ICD-10-CM

## 2021-03-30 DIAGNOSIS — E1165 Type 2 diabetes mellitus with hyperglycemia: Secondary | ICD-10-CM

## 2021-03-30 LAB — POCT GLYCOSYLATED HEMOGLOBIN (HGB A1C): Hemoglobin A1C: 11.8 % — AB (ref 4.0–5.6)

## 2021-03-30 LAB — POCT GLUCOSE (DEVICE FOR HOME USE): POC Glucose: 82 mg/dl (ref 70–99)

## 2021-03-30 MED ORDER — POTASSIUM CHLORIDE CRYS ER 20 MEQ PO TBCR: 20.0000 meq | EXTENDED_RELEASE_TABLET | Freq: Every day | ORAL | 1 refills | Status: DC

## 2021-03-30 MED ORDER — DEXCOM G6 TRANSMITTER MISC: 1.0000 | Freq: Once | 1 refills | Status: AC

## 2021-03-30 MED ORDER — DEXCOM G6 SENSOR MISC: 3 refills | Status: DC

## 2021-03-30 NOTE — Patient Instructions (Addendum)
Take 20 units at supper and 10 with 1st meal of day  Check blood sugars on waking up 7 days a week  Also check blood sugars about 2 hours after meals and do this after different meals by rotation  Recommended blood sugar levels on waking up are 90-130 and about 2 hours after meal is 130-160  Please bring your blood sugar monitor to each visit, thank you

## 2021-03-30 NOTE — Progress Notes (Signed)
Patient ID: Shannon Oliver, female   DOB: [birth year], 60 y.o.   MRN: [id number]          Reason for Appointment: Follow-up  Referring physician: Dr. Fara Olden   History of Present Illness:          Date of diagnosis of type 2 diabetes mellitus: 2008       Background history:   She thinks she has been on mostly oral hypoglycemic drugs notably metformin and Amaryl for most of the duration of her diabetes She was probably started on insulin in 2018 with Levemir insulin when her A1c was 9.6 She has not had an endocrinology follow-up since 03/2017  Recent history:   INSULIN regimen is:  Novolin 70/30, 20-30 units acl and bedtime  Non-insulin hypoglycemic drugs the patient is taking are: Metformin 1000mg  bid   She has not been seen in follow-up since 3/22  A1c is now 11.8  Current management, blood sugar patterns and problems identified: She has regularly had blood sugars over 300 fasting until recently  She lost her Accu-Chek and is doing blood sugar monitoring at home with the [employer] meter and did not bring her meter She was not able to get her Dexcom refilled because of insurance issue and only checking her sugar once a day in the morning  She has taken her insulin at variable times in the evening dose usually at bedtime instead of suppertime  Although her A1c is higher she says that the last 10 days she has been on a weight management program from a company called [employer] With this she is apparently cutting back out on her carbohydrates and may only have some carbohydrates in the form of beans or buckwheat and some vegetables However she still is taking variable doses of insulin at different times based on her blood sugars in the mornings only  She does feel like her blood sugars will get low occasionally at Different times Today she has not had any food intake except for a protein drink in the morning and green beans at lunch; without any insulin her blood sugar in the  office was only 82 In the last few days with a target her fasting readings are between 150-190 Weight has come down 10 pounds since her last visit   Walking: 30 minutes 3 to 4 days a week       Side effects from medications have been: Dizziness and sharp side pains from Invokana, Rybelsus made her sick, Farxiga   Glucose monitoring:  done <1 times a day         Glucometer: [employer]      Blood Glucose readings : As above   Self-care: The diet that the patient has been following is: tries to limit sugar drinks.     Meal times are: Usually 2 meals a day, variable times  Typical meal intake: Breakfast is eggs and toast               Dietician visit, most recent: Never  Weight history:  Wt Readings from Last 3 Encounters:  03/30/21 248 lb 3.2 oz (112.6 kg)  09/08/20 258 lb (117 kg)  12/23/18 255 lb (115.7 kg)    Glycemic control:   Lab Results  Component Value Date   HGBA1C 11.8 (A) 03/30/2021   HGBA1C 10.3 (H) 09/05/2020   HGBA1C 7.8 (A) 07/19/2018   Lab Results  Component Value Date   MICROALBUR 36.2 (H) 09/05/2020   LDLCALC 78 09/05/2020   CREATININE  0.91 03/27/2021   Lab Results  Component Value Date   MICRALBCREAT 24.5 09/05/2020    Lab Results  Component Value Date   FRUCTOSAMINE 431 (H) 03/27/2021   FRUCTOSAMINE 394 (H) 04/13/2018   FRUCTOSAMINE 361 (H) 11/14/2017      Allergies as of 03/30/2021       Reactions   Canagliflozin    Other reaction(s): dizziness, stabbing pain in right side of body   Hydrocodone Hypertension        Medication List        Accurate as of March 30, 2021  4:39 PM. If you have any questions, ask your nurse or doctor.          STOP taking these medications    dextromethorphan-guaiFENesin 30-600 MG 12hr tablet Commonly known as: [place] DM Stopped by: Elayne Snare, MD   Farxiga 5 MG Tabs tablet Generic drug: dapagliflozin propanediol Stopped by: Elayne Snare, MD   gabapentin 300 MG capsule Commonly known  as: NEURONTIN Stopped by: Elayne Snare, MD   methocarbamol 500 MG tablet Commonly known as: ROBAXIN Stopped by: Elayne Snare, MD       TAKE these medications    acetaminophen 500 MG tablet Commonly known as: TYLENOL Take 500-1,000 mg by mouth every 6 (six) hours as needed for mild pain.   amLODipine 10 MG tablet Commonly known as: NORVASC TAKE 1 TABLET EVERY DAY ONCE A DAY ORALLY 90 What changed:  how much to take how to take this when to take this additional instructions   clotrimazole 1 % cream Commonly known as: LOTRIMIN Apply 1 application topically in the morning and at bedtime.   cyclobenzaprine 10 MG tablet Commonly known as: FLEXERIL Take 10 mg by mouth every 6 (six) hours as needed for muscle spasms.   Dexcom G6 Sensor Misc Use to monitor blood sugar, change after 10 days   Dexcom G6 Transmitter Misc 1 Device by Does not apply route once for 1 dose. Started by: Elayne Snare, MD   glucose blood test strip Commonly known as: Accu-Chek Guide Use as instructed to check blood sugar one time daily.   Insulin Syringe-Needle U-100 30G X 1/2" 0.3 ML Misc Use twice a day with insulin   loratadine 10 MG tablet Commonly known as: CLARITIN Take 10 mg by mouth daily as needed for allergies.   losartan-hydrochlorothiazide 100-25 MG tablet Commonly known as: HYZAAR Take 1 tablet by mouth every morning.   metFORMIN 1000 MG tablet Commonly known as: GLUCOPHAGE Take 1,000 mg by mouth 2 (two) times daily.   metoprolol succinate 100 MG 24 hr tablet Commonly known as: TOPROL-XL Take 100 mg by mouth daily.   naproxen 500 MG tablet Commonly known as: NAPROSYN Take 500 mg by mouth 2 (two) times daily as needed for moderate pain.   NovoLIN 70/30 Kwikpen (70-30) 100 UNIT/ML KwikPen Generic drug: insulin isophane & regular human KwikPen Inject 30 Units into the skin in the morning and at bedtime.   ondansetron 4 MG tablet Commonly known as: ZOFRAN Take 4 mg by mouth  every 6 (six) hours as needed for vomiting or nausea.   ONE-A-DAY WOMENS PO Take 1 tablet by mouth daily.   potassium chloride SA 20 MEQ tablet Commonly known as: [place]-Con M20 Take 1 tablet (20 mEq total) by mouth daily. What changed: how much to take Changed by: Elayne Snare, MD   rosuvastatin 20 MG tablet Commonly known as: CRESTOR Take 20 mg by mouth at bedtime.   tiZANidine 2  MG tablet Commonly known as: ZANAFLEX Take 2 mg by mouth every 12 (twelve) hours as needed for muscle pain.   traMADol 50 MG tablet Commonly known as: ULTRAM Take 50 mg by mouth every 6 (six) hours as needed for moderate pain.        Allergies:  Allergies  Allergen Reactions   Canagliflozin     Other reaction(s): dizziness, stabbing pain in right side of body   Hydrocodone Hypertension    Past Medical History:  Diagnosis Date   Abnormal EKG    LVH with strain   Acid reflux    Depression    Diabetes mellitus    A1C over 9   HLD (hyperlipidemia)    Hypertension    Noncompliance    Obesity     Past Surgical History:  Procedure Laterality Date   COLONOSCOPY WITH PROPOFOL N/A 04/29/2015   Procedure: COLONOSCOPY WITH PROPOFOL;  Surgeon: Garlan Fair, MD;  Location: [facility];  Service: Endoscopy;  Laterality: N/A;   HYSTEROSCOPY WITH D & C N/A 09/07/2017   Procedure: DILATATION AND CURETTAGE /HYSTEROSCOPY;  Surgeon: Sloan Leiter, MD;  Location: [facility];  Service: Gynecology;  Laterality: N/A;   KNEE ARTHROSCOPY W/ MENISCAL REPAIR Right     Family History  Problem Relation Age of Onset   Cancer Mother    Hypertension Mother    Diabetes Mother    Breast cancer Maternal Aunt    Breast cancer Cousin     Social History:  reports that she has never smoked. She has never used smokeless tobacco. She reports that she does not currently use alcohol. She reports that she does not use drugs.   Review of Systems  Psychiatric/Behavioral:  Positive for depressed mood.     Lipid history:  Treated with Zocor 20 mg daily Her last LDL was below 100    Lab Results  Component Value Date   CHOL 163 09/05/2020   HDL 48.00 09/05/2020   LDLCALC 78 09/05/2020   TRIG 187.0 (H) 09/05/2020   CHOLHDL 3 09/05/2020           Hypertension: She is on losartan HCT, metoprolol and Norvasc from PCP    BP Readings from Last 3 Encounters:  03/30/21 130/82  01/13/21 122/85  10/30/20 (!) 138/91    Hypokalemia: Has not been taking any potassium supplements and K is low  Lab Results  Component Value Date   K 3.4 (L) 03/27/2021       LABS:  Office Visit on 03/30/2021  Component Date Value Ref Range Status   Hemoglobin A1C 03/30/2021 11.8 (A) 4.0 - 5.6 % Final   POC Glucose 03/30/2021 82  70 - 99 mg/dl Final  Appointment on 03/27/2021  Component Date Value Ref Range Status   Fructosamine 03/27/2021 431 (A) 0 - 285 umol/L Final   Comment: Published reference interval for apparently healthy subjects between age 2 and 22 is 49 - 285 umol/L and in a poorly controlled diabetic population is 228 - 563 umol/L with a mean of 396 umol/L.    Sodium 03/27/2021 140  135 - 145 mEq/L Final   Potassium 03/27/2021 3.4 (A) 3.5 - 5.1 mEq/L Final   Chloride 03/27/2021 104  96 - 112 mEq/L Final   CO2 03/27/2021 25  19 - 32 mEq/L Final   Glucose, Bld 03/27/2021 120 (A) 70 - 99 mg/dL Final   BUN 03/27/2021 20  6 - 23 mg/dL Final   Creatinine, Ser 03/27/2021 0.91  0.40 - 1.20 mg/dL Final   GFR 03/27/2021 68.78  >60.00 mL/min Final   Calculated using the CKD-EPI Creatinine Equation (2021)   Calcium 03/27/2021 9.4  8.4 - 10.5 mg/dL Final    Physical Examination:  BP 130/82   Pulse 87   Ht 5\' 6"  (1.676 m)   Wt 248 lb 3.2 oz (112.6 kg)   LMP 08/02/2013 Comment: spotting  SpO2 99%   BMI 40.06 kg/m         ASSESSMENT:  Diabetes type 2, uncontrolled with morbid obesity  See history of present illness for detailed discussion of current diabetes management,  blood sugar patterns and problems identified  Her A1c is much higher at 11.8  Her blood sugars have been consistently poorly controlled except recently With her severe obesity and insulin resistance she was not getting enough insulin previously She also did not report that she was having candidiasis from [place] and stopped this  More recently was going on a restricted low carbohydrate diet her blood sugars appear to be improving although still has high fasting readings despite continuing metformin She is quite unclear when to take insulin and With her premixed insulin is taking some doses midday and some at bedtime With fasting practically today and having her carbohydrates her sugar is only 82 in the office without any insulin  Previously had nausea with Rybelsus and likely with other GLP-1 drugs  Hypercholesterolemia: Controlled with Zocor   HYPERTENSION: She has better control  Again has hypokalemia from HCTZ and not on supplements   PLAN:   Discussed adjustment and timing of insulin  Since she is showing high readings slightly after dinner and overnight she will start taking her evening shot at suppertime  Most likely she needs to continue with only 20 units for now at suppertime  She will reduce her morning dose to 10 units if eating breakfast and if no carbohydrates in the morning take 10 units before her lunch meal  She will continue her low carbohydrate diet as long as she can tolerate this Regular walking for exercise She will continue Metformin Needs more regular follow-up Start monitoring blood sugars after meals regularly and monitor at least 3 times a day Will send prescription for Dexcom sensor and transmitter to the mail order pharmacy and determine her coverage through that avenue  Start potassium supplementation with 20 mg once daily especially since she is just starting back on her HCTZ   Patient Instructions  Take 20 units at supper and 10 with 1st meal of  day  Check blood sugars on waking up 7 days a week  Also check blood sugars about 2 hours after meals and do this after different meals by rotation  Recommended blood sugar levels on waking up are 90-130 and about 2 hours after meal is 130-160  Please bring your blood sugar monitor to each visit, thank you         Elayne Snare 03/30/2021, 4:39 PM   Note: This office note was prepared with Dragon voice recognition system technology. Any transcriptional errors that result from this process are unintentional.

## 2021-04-02 ENCOUNTER — Telehealth: Payer: Self-pay | Admitting: Endocrinology

## 2021-04-02 NOTE — Telephone Encounter (Signed)
[employer] calling in regards to a PA for Dexcom G6. Please fax to [place]

## 2021-04-06 ENCOUNTER — Other Ambulatory Visit (HOSPITAL_COMMUNITY): Payer: Self-pay

## 2021-04-06 ENCOUNTER — Telehealth: Payer: Self-pay | Admitting: Pharmacy Technician

## 2021-04-06 NOTE — Telephone Encounter (Signed)
Patient Advocate Encounter   Received notification from CoverMyMeds that prior authorization for Dexcom is required.   PA submitted on 04/06/21 Key [id number] Status is pending    [facility] will continue to follow:   Armanda Magic, CPhT Patient Advocate [facility] Phone: [phone] Fax:  [phone]

## 2021-04-07 NOTE — Telephone Encounter (Signed)
I tried calling patient no answer. Left voicemail to give [place] a call back.

## 2021-04-08 NOTE — Telephone Encounter (Signed)
Patient returned Alycia's call and requests to be called at ph# [phone]

## 2021-04-09 NOTE — Telephone Encounter (Signed)
Called patient back. No answer. Left vm to call me back

## 2021-04-17 DIAGNOSIS — Z6839 Body mass index (BMI) 39.0-39.9, adult: Secondary | ICD-10-CM | POA: Insufficient documentation

## 2021-04-21 ENCOUNTER — Other Ambulatory Visit: Payer: Self-pay | Admitting: Endocrinology

## 2021-04-21 DIAGNOSIS — E1165 Type 2 diabetes mellitus with hyperglycemia: Secondary | ICD-10-CM

## 2021-04-21 DIAGNOSIS — Z794 Long term (current) use of insulin: Secondary | ICD-10-CM

## 2021-04-22 ENCOUNTER — Other Ambulatory Visit (INDEPENDENT_AMBULATORY_CARE_PROVIDER_SITE_OTHER): Payer: BC Managed Care – PPO

## 2021-04-22 ENCOUNTER — Other Ambulatory Visit: Payer: Self-pay

## 2021-04-22 DIAGNOSIS — Z794 Long term (current) use of insulin: Secondary | ICD-10-CM

## 2021-04-22 DIAGNOSIS — E1165 Type 2 diabetes mellitus with hyperglycemia: Secondary | ICD-10-CM

## 2021-04-22 LAB — BASIC METABOLIC PANEL
BUN: 23 mg/dL (ref 6–23)
CO2: 24 mEq/L (ref 19–32)
Calcium: 9.4 mg/dL (ref 8.4–10.5)
Chloride: 106 mEq/L (ref 96–112)
Creatinine, Ser: 0.96 mg/dL (ref 0.40–1.20)
GFR: 64.47 mL/min (ref >60.00)
Glucose, Bld: 246 mg/dL — ABNORMAL HIGH (ref 70–99)
Potassium: 3.4 mEq/L — ABNORMAL LOW (ref 3.5–5.1)
Sodium: 140 mEq/L (ref 135–145)

## 2021-04-23 ENCOUNTER — Other Ambulatory Visit: Payer: Self-pay | Admitting: Internal Medicine

## 2021-04-23 LAB — FRUCTOSAMINE: Fructosamine: 345 umol/L — ABNORMAL HIGH (ref 0–285)

## 2021-04-25 ENCOUNTER — Encounter (HOSPITAL_BASED_OUTPATIENT_CLINIC_OR_DEPARTMENT_OTHER): Payer: Self-pay | Admitting: Emergency Medicine

## 2021-04-25 ENCOUNTER — Emergency Department (HOSPITAL_BASED_OUTPATIENT_CLINIC_OR_DEPARTMENT_OTHER): Payer: BC Managed Care – PPO

## 2021-04-25 ENCOUNTER — Other Ambulatory Visit: Payer: Self-pay

## 2021-04-25 ENCOUNTER — Emergency Department (HOSPITAL_BASED_OUTPATIENT_CLINIC_OR_DEPARTMENT_OTHER)
Admission: EM | Admit: 2021-04-25 | Discharge: 2021-04-25 | Disposition: A | Discharge status: Home / Self Care | Payer: BC Managed Care – PPO | Attending: Emergency Medicine | Admitting: Emergency Medicine

## 2021-04-25 DIAGNOSIS — Z794 Long term (current) use of insulin: Secondary | ICD-10-CM | POA: Insufficient documentation

## 2021-04-25 DIAGNOSIS — M25572 Pain in left ankle and joints of left foot: Secondary | ICD-10-CM | POA: Diagnosis not present

## 2021-04-25 DIAGNOSIS — I10 Essential (primary) hypertension: Secondary | ICD-10-CM | POA: Diagnosis not present

## 2021-04-25 DIAGNOSIS — E119 Type 2 diabetes mellitus without complications: Secondary | ICD-10-CM | POA: Diagnosis not present

## 2021-04-25 IMAGING — DX DG ANKLE COMPLETE 3+V*L*
1 series · 3 of 3 positions shown · non-contrast
Comparison: None.

CLINICAL DATA: 6-year-old female with ankle pain.

EXAM:
LEFT ANKLE COMPLETE - 3+ VIEW

[Series 1: ankle · 0.14mm/px · 3 of 3 slices shown]
[im 1/3]
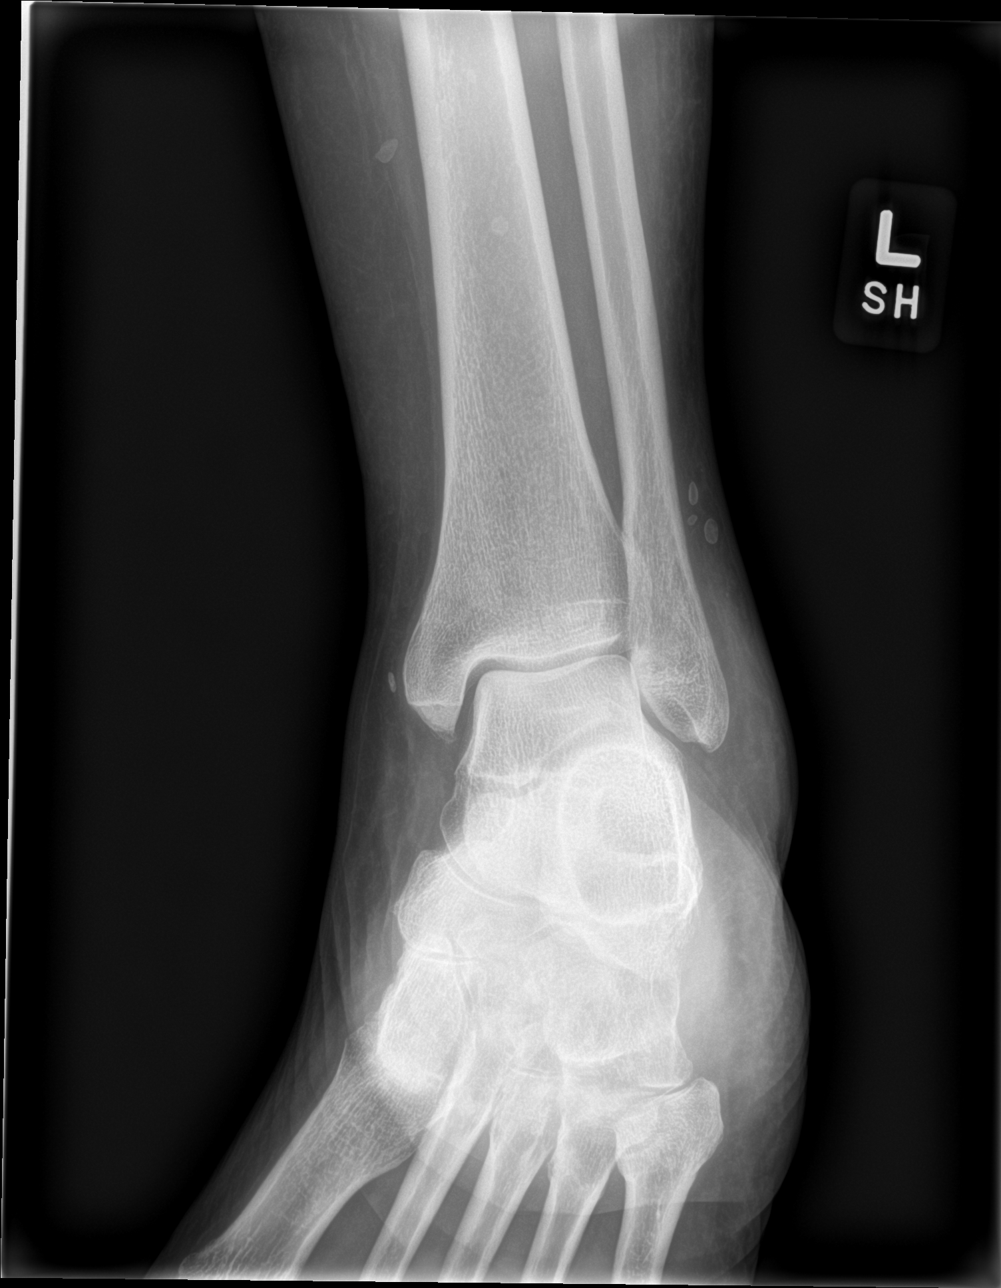
[im 2/3]
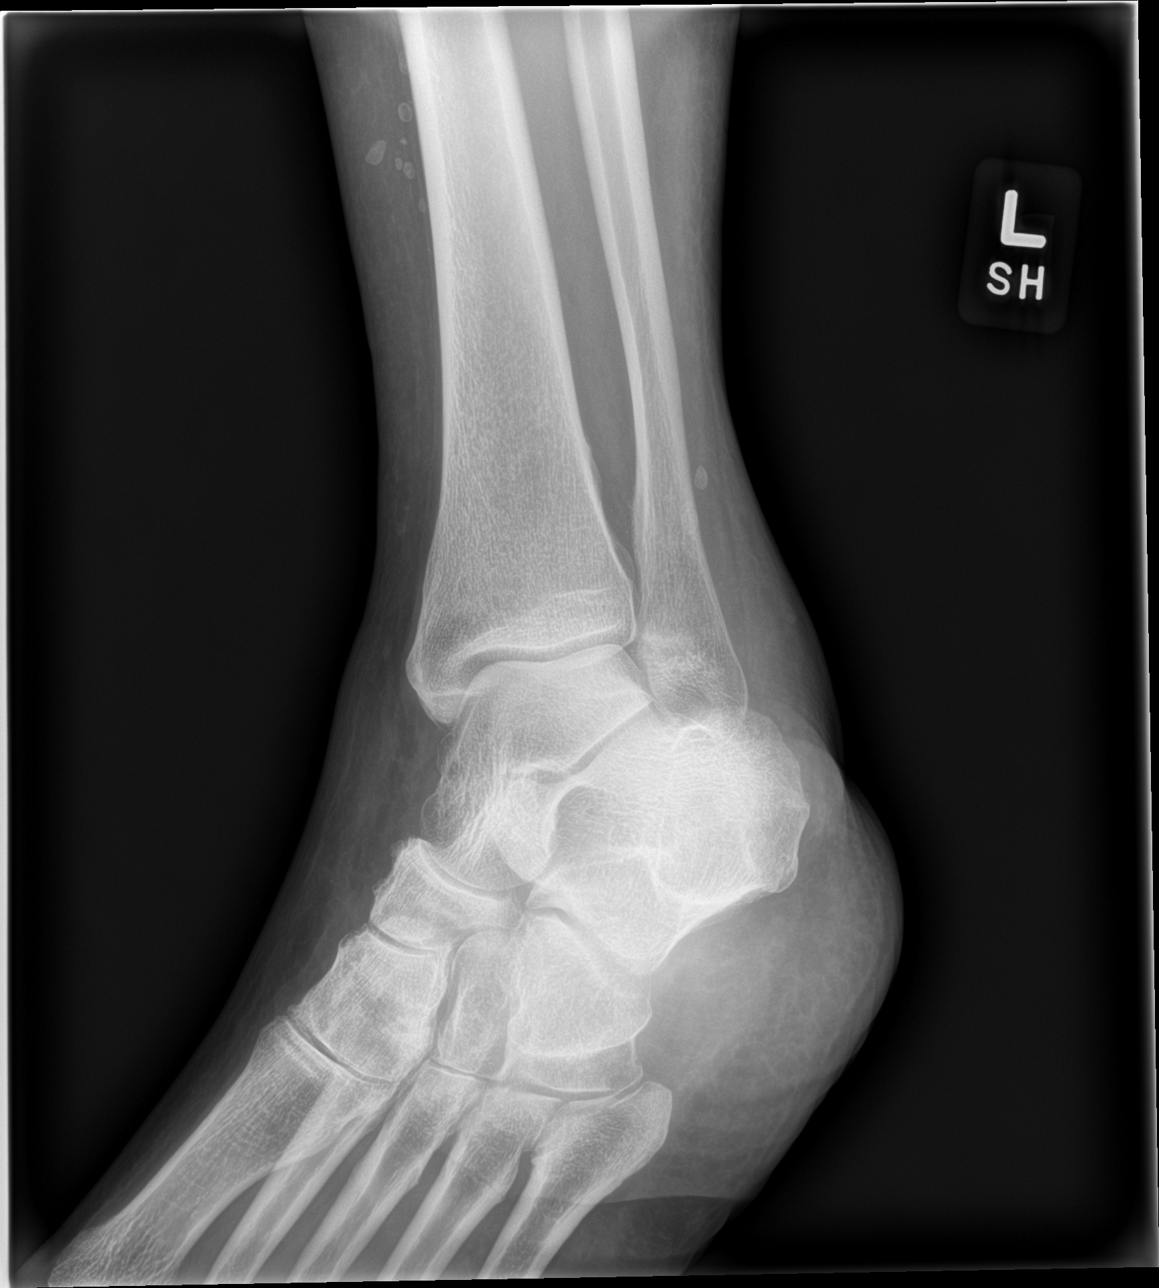
[im 3/3]
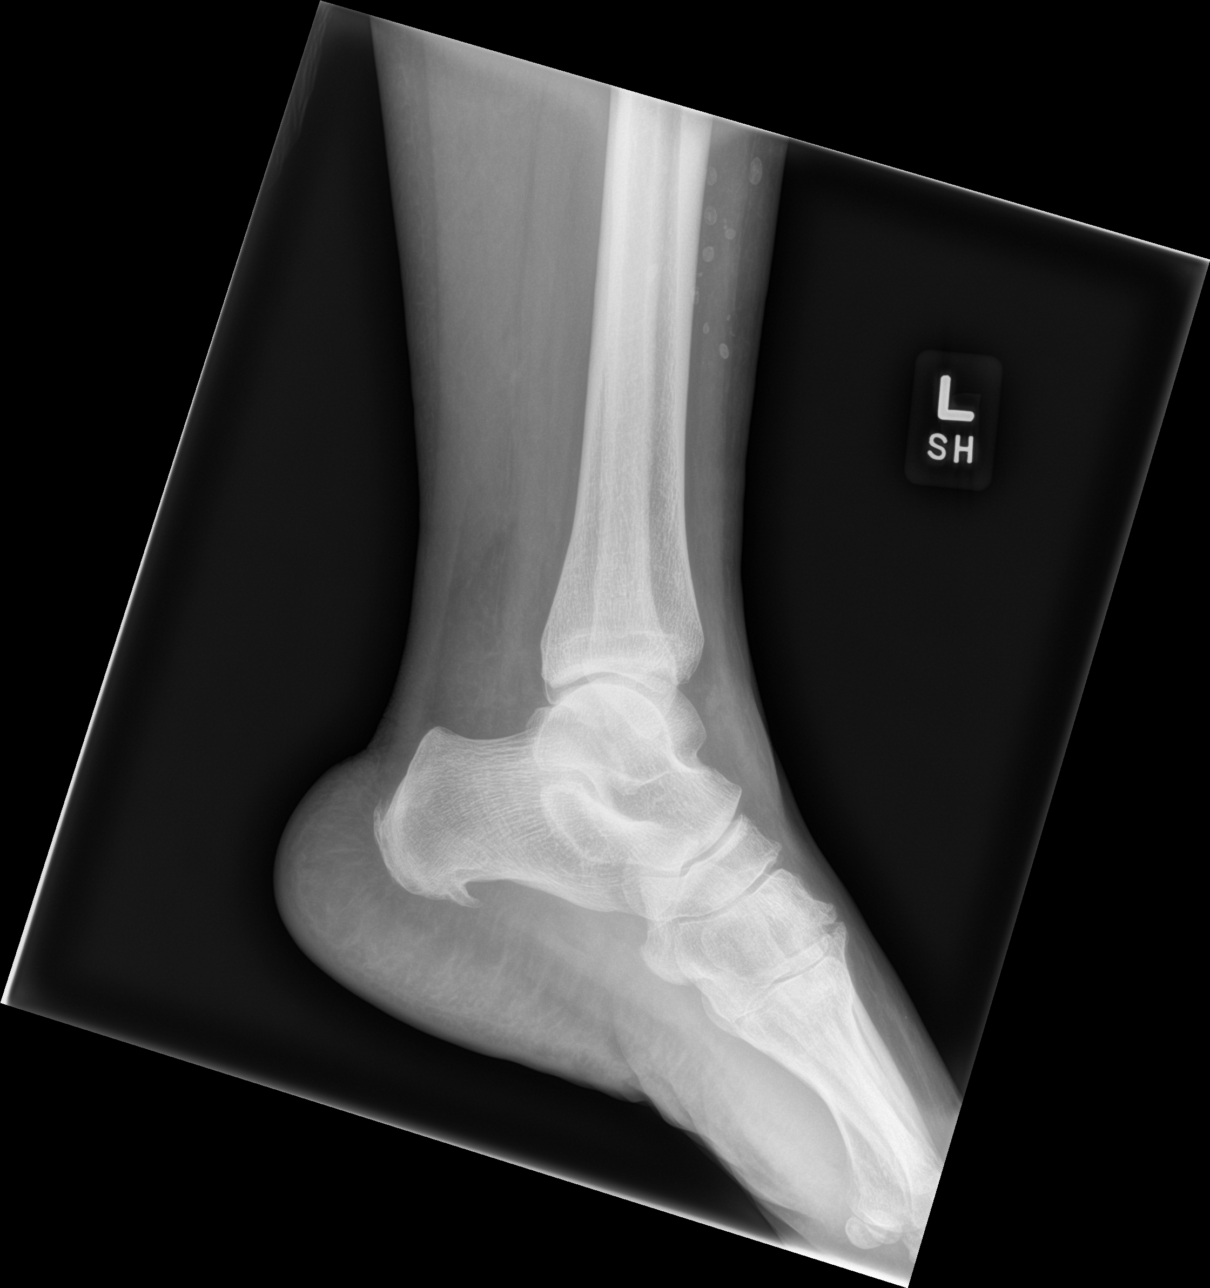

[3 of 3 positions shown; findings below may reference images not displayed]

FINDINGS: There is no evidence of fracture, dislocation, or joint effusion.
Achilles and plantar calcaneal enthesopathy. Scattered midfoot
degenerative changes. Phleboliths are present in the left lower
extremity.
IMPRESSION: No acute fracture or malalignment.

## 2021-04-25 MED ORDER — IBUPROFEN 400 MG PO TABS
600.0000 mg | ORAL_TABLET | Freq: Once | ORAL | Status: AC
  Administered 2021-04-25: 600 mg via ORAL
  Filled 2021-04-25: qty 1

## 2021-04-25 MED ORDER — OXYCODONE-ACETAMINOPHEN 5-325 MG PO TABS
1.0000 | ORAL_TABLET | Freq: Once | ORAL | Status: AC
  Administered 2021-04-25: 1 via ORAL
  Filled 2021-04-25: qty 1

## 2021-04-25 MED ORDER — OXYCODONE-ACETAMINOPHEN 5-325 MG PO TABS: 1.0000 | ORAL_TABLET | Freq: Four times a day (QID) | ORAL | 0 refills | Status: DC | PRN

## 2021-04-25 NOTE — ED Triage Notes (Signed)
Pt reports bilateral foot pain/tenderness that is worse with walking and intermittent swelling. Pt reports pain is worse in left foot. No obvious deformity or injury noted. Distal pulses intact.

## 2021-04-25 NOTE — Discharge Instructions (Addendum)
Call your primary care doctor or specialist as discussed in the next 2-3 days.   Return immediately back to the ER if:  Your symptoms worsen within the next 12-24 hours. You develop new symptoms such as new fevers, persistent vomiting, new pain, shortness of breath, or new weakness or numbness, or if you have any other concerns.  

## 2021-04-25 NOTE — ED Provider Notes (Signed)
[facility] EMERGENCY DEPT Provider Note   CSN: [id number] Arrival date & time: 04/25/21  1006     History Chief complaint of left ankle pain  Shannon Oliver is a 60 y.o. female.  Patient presents chief complaint of left ankle pain.  She states this started this morning when she woke up from bed.  She has a history of bilateral feet pain left worse than the right.  This is been ongoing for several weeks and she was planning on seeing a podiatrist.  However she woke up this morning and stated her left ankle was tender.  Denies any fall or trauma that she can recall.  No reports of fevers or cough or vomiting or diarrhea.  Ankle pain is worse when she pushes on the lateral aspect of the ankle or flexes her ankle.      Past Medical History:  Diagnosis Date   Abnormal EKG    LVH with strain   Acid reflux    Depression    Diabetes mellitus    A1C over 9   HLD (hyperlipidemia)    Hypertension    Noncompliance    Obesity     Patient Active Problem List   Diagnosis Date Noted   Elevated troponin 10/30/2020   AKI (acute kidney injury) ([place]) 10/30/2020   Palpitations    Chest pain 10/29/2020   Postmenopausal bleeding 06/05/2017   Neck pain 03/01/2014   Edema 01/25/2013   DM (diabetes mellitus) ([place]) 01/25/2013   Dyspnea 01/25/2013   Abnormal ECG 01/25/2013   Hypertension    Acid reflux     Past Surgical History:  Procedure Laterality Date   COLONOSCOPY WITH PROPOFOL N/A 04/29/2015   Procedure: COLONOSCOPY WITH PROPOFOL;  Surgeon: Garlan Fair, MD;  Location: [facility];  Service: Endoscopy;  Laterality: N/A;   HYSTEROSCOPY WITH D & C N/A 09/07/2017   Procedure: DILATATION AND CURETTAGE /HYSTEROSCOPY;  Surgeon: Sloan Leiter, MD;  Location: [facility];  Service: Gynecology;  Laterality: N/A;   KNEE ARTHROSCOPY W/ MENISCAL REPAIR Right      OB History     Gravida  3   Para  3   Term  3   Preterm  0   AB  0   Living  3       SAB  0   IAB  0   Ectopic  0   Multiple      Live Births  3           Family History  Problem Relation Age of Onset   Cancer Mother    Hypertension Mother    Diabetes Mother    Breast cancer Maternal Aunt    Breast cancer Cousin     Social History   Tobacco Use   Smoking status: Never   Smokeless tobacco: Never  Vaping Use   Vaping Use: Never used  Substance Use Topics   Alcohol use: Not Currently    Comment: occ   Drug use: No    Home Medications Prior to Admission medications   Medication Sig Start Date End Date Taking? Authorizing Provider  amLODipine (NORVASC) 10 MG tablet TAKE 1 TABLET EVERY DAY ONCE A DAY ORALLY 90 Patient taking differently: Take 10 mg by mouth daily. 03/13/18  Yes Elayne Snare, MD  glucose blood (ACCU-CHEK GUIDE) test strip Use as instructed to check blood sugar one time daily. 11/16/17  Yes Elayne Snare, MD  Insulin Syringe-Needle U-100 30G X 1/2" 0.3  ML MISC Use twice a day with insulin 10/17/17  Yes Elayne Snare, MD  metFORMIN (GLUCOPHAGE) 1000 MG tablet Take 1,000 mg by mouth 2 (two) times daily. 03/18/20  Yes [provider]  Multiple Vitamins-Calcium (ONE-A-DAY WOMENS PO) Take 1 tablet by mouth daily.   Yes [provider]  NOVOLIN 70/30 FLEXPEN (70-30) 100 UNIT/ML KwikPen Inject 30 Units into the skin in the morning and at bedtime. 03/18/20  Yes [provider]  oxyCODONE-acetaminophen (PERCOCET/ROXICET) 5-325 MG tablet Take 1 tablet by mouth every 6 (six) hours as needed for up to 5 doses for severe pain. 04/25/21  Yes Luna Fuse, MD  rosuvastatin (CRESTOR) 20 MG tablet Take 20 mg by mouth at bedtime. 10/20/20  Yes [provider]  clotrimazole (LOTRIMIN) 1 % cream Apply 1 application topically in the morning and at bedtime. Patient not taking: No sig reported 10/29/20   [provider]  Continuous Blood Gluc Sensor (DEXCOM G6 SENSOR) MISC Use to monitor blood sugar, change after 10 days  03/30/21   Elayne Snare, MD  cyclobenzaprine (FLEXERIL) 10 MG tablet Take 10 mg by mouth every 6 (six) hours as needed for muscle spasms. 08/27/20   [provider]  loratadine (CLARITIN) 10 MG tablet Take 10 mg by mouth daily as needed for allergies.    [provider]  losartan-hydrochlorothiazide (HYZAAR) 100-25 MG per tablet Take 1 tablet by mouth every morning.     [provider]  metoprolol succinate (TOPROL-XL) 100 MG 24 hr tablet Take 100 mg by mouth daily.  09/14/17   [provider]  naproxen (NAPROSYN) 500 MG tablet Take 500 mg by mouth 2 (two) times daily as needed for moderate pain. 08/19/20   [provider]  ondansetron (ZOFRAN) 4 MG tablet Take 4 mg by mouth every 6 (six) hours as needed for vomiting or nausea. 08/19/20   [provider]  potassium chloride SA (KLOR-CON M20) 20 MEQ tablet Take 1 tablet (20 mEq total) by mouth daily. 03/30/21   Elayne Snare, MD  tiZANidine (ZANAFLEX) 2 MG tablet Take 2 mg by mouth every 12 (twelve) hours as needed for muscle pain. 02/22/20   [provider]    Allergies    Canagliflozin and Hydrocodone  Review of Systems   Review of Systems  Constitutional:  Negative for fever.  HENT:  Negative for ear pain.   Eyes:  Negative for pain.  Respiratory:  Negative for cough.   Cardiovascular:  Negative for chest pain.  Gastrointestinal:  Negative for abdominal pain.  Genitourinary:  Negative for flank pain.  Musculoskeletal:  Negative for back pain.  Skin:  Negative for rash.  Neurological:  Negative for headaches.   Physical Exam Updated Vital Signs BP (!) 155/88   Pulse 79   Temp 98.5 F (36.9 C) (Oral)   Resp 16   Ht 5\' 6"  (1.676 m)   Wt 109.5 kg   LMP 08/02/2013 Comment: spotting  SpO2 100%   BMI 38.98 kg/m   Physical Exam Constitutional:      General: She is not in acute distress.    Appearance: Normal appearance.  HENT:     Head: Normocephalic.     Nose: Nose normal.   Eyes:     Extraocular Movements: Extraocular movements intact.  Cardiovascular:     Rate and Rhythm: Normal rate.  Pulmonary:     Effort: Pulmonary effort is normal.  Musculoskeletal:     Cervical back: Normal range of motion.  Comments: Moderate swelling seen in the left ankle lateral aspect.  No skin deformity or skeletal deformity noted.  Tenderness present palpation of the left malleoli and posterior malleoli region.  Range of motion of the left ankle intact about 20 degrees with only mild pain and anything beyond that causing too much pain.  Otherwise neurovascularly intact compartments are soft dorsalis pedis pulses are intact in left lower extremity.  Neurological:     General: No focal deficit present.     Mental Status: She is alert. Mental status is at baseline.    ED Results / Procedures / Treatments   Labs (all labs ordered are listed, but only abnormal results are displayed) Labs Reviewed - No data to display  EKG None  Radiology DG Ankle Complete Left  Result Date: 04/25/2021 CLINICAL DATA:  2-year-old female with ankle pain. EXAM: LEFT ANKLE COMPLETE - 3+ VIEW COMPARISON:  None. FINDINGS: There is no evidence of fracture, dislocation, or joint effusion. Achilles and plantar calcaneal enthesopathy. Scattered midfoot degenerative changes. Phleboliths are present in the left lower extremity. IMPRESSION: No acute fracture or malalignment. Electronically Signed   By: Ruthann Cancer M.D.   On: 04/25/2021 11:19    Procedures Procedures   Medications Ordered in ED Medications  oxyCODONE-acetaminophen (PERCOCET/ROXICET) 5-325 MG per tablet 1 tablet (1 tablet Oral Given 04/25/21 1048)  ibuprofen (ADVIL) tablet 600 mg (600 mg Oral Given 04/25/21 1048)    ED Course  I have reviewed the triage vital signs and the nursing notes.  Pertinent labs & imaging results that were available during my care of the patient were reviewed by me and considered in my medical decision  making (see chart for details).    MDM Rules/Calculators/A&P                           I did consider differential of septic joint, however she has intact range of motion to about 20 degrees, there is no abnormal warmth or cellulitis of the joint noted.  At this time and presentation I do not think the risks of joint aspiration outweigh the benefits.  I suspect her walking with a cane and compensating for continued bilateral feet pain for the past few weeks may have played a factor in her new left ankle pain.  X-rays pursued these are unremarkable, consistent with arthritic changes.  Patient given Motrin and Percocet.  Final Clinical Impression(s) / ED Diagnoses Final diagnoses:  Acute left ankle pain    Rx / DC Orders ED Discharge Orders          Ordered    oxyCODONE-acetaminophen (PERCOCET/ROXICET) 5-325 MG tablet  Every 6 hours PRN        04/25/21 1131             Luna Fuse, MD 04/25/21 1131

## 2021-04-27 ENCOUNTER — Encounter: Payer: Self-pay | Admitting: Endocrinology

## 2021-04-27 ENCOUNTER — Other Ambulatory Visit: Payer: Self-pay

## 2021-04-27 ENCOUNTER — Ambulatory Visit (INDEPENDENT_AMBULATORY_CARE_PROVIDER_SITE_OTHER): Payer: BC Managed Care – PPO | Admitting: Endocrinology

## 2021-04-27 VITALS — BP 122/80 | HR 91 | Ht 66.0 in | Wt 242.6 lb

## 2021-04-27 DIAGNOSIS — Z794 Long term (current) use of insulin: Secondary | ICD-10-CM

## 2021-04-27 DIAGNOSIS — I10 Essential (primary) hypertension: Secondary | ICD-10-CM

## 2021-04-27 DIAGNOSIS — E876 Hypokalemia: Secondary | ICD-10-CM | POA: Diagnosis not present

## 2021-04-27 DIAGNOSIS — E1165 Type 2 diabetes mellitus with hyperglycemia: Secondary | ICD-10-CM | POA: Diagnosis not present

## 2021-04-27 MED ORDER — ACCU-CHEK GUIDE VI STRP: ORAL_STRIP | 12 refills | Status: DC

## 2021-04-27 MED ORDER — ACCU-CHEK SOFTCLIX LANCETS MISC: 3 refills | Status: DC

## 2021-04-27 NOTE — Patient Instructions (Addendum)
Take spironolactone 2x daily  Take evening insulin consistently before supper preferably 15 to 30 minutes before eating  Take 10 units of insulin before your first meal of the day which may be lunchtime Check some blood sugars couple of hours after lunch and supper  If the blood sugars after any given meal is over 180 increase the insulin for that meal by 4 units  Check the blood pressure with an arm blood pressure monitor preferably

## 2021-04-27 NOTE — Progress Notes (Signed)
Patient ID: Shannon Oliver, female   DOB: April 01, 1961, 60 y.o.   MRN: [id number]          Reason for Appointment: Follow-up  Referring physician: Dr. Fara Olden   History of Present Illness:          Date of diagnosis of type 2 diabetes mellitus: 2008       Background history:   She thinks she has been on mostly oral hypoglycemic drugs notably metformin and Amaryl for most of the duration of her diabetes She was probably started on insulin in 2018 with Levemir insulin when her A1c was 9.6 She has not had an endocrinology follow-up since 03/2017  Recent history:   INSULIN regimen is:  Novolin 70/30, 25 units once a day in the evening  Non-insulin hypoglycemic drugs the patient is taking are: Metformin 1000mg  bid, Farxiga 2.5mg     A1c is 11.8 last and fructosamine is 345  Current management, blood sugar patterns and problems identified: She has not followed any instructions for the insulin or monitoring that were given on her last visit  Although she has lost weight with her reduced calorie and carbohydrate diet her blood sugars are still not controlled  She is mostly skipping breakfast but she did not start taking insulin at lunchtime as directed; blood sugar after lunch in the lab was 246. She has not checked any sugars after dinner as directed  FASTING blood sugars in the last few days are improving and now 138 today, as high as 197 about a week or so ago She lost her Accu-Chek previously is doing blood sugar monitoring at home with the [employer] meter  She was not able to get her Dexcom approved because of insurance policy of not covering it if she is not on 3 injections of insulin a day  and only checking her sugar once a day in the morning  She has taken her insulin at variable times in the evening and not before the meal as directed  Not clear what her blood sugars are in the evenings after dinner or before dinner  No reported low sugars overnight She was told by the  cardiologist to start half a tablet of Farxiga and she says that she is trying this and not having significant yeast infections with this   Walking: 30 minutes 3 to 4 days a week       Side effects from medications have been: Dizziness and sharp side pains from Invokana, Rybelsus made her sick, Farxiga   Glucose monitoring:  done <1 times a day         Glucometer: [employer]      Blood Glucose readings : As above   Self-care: The diet that the patient has been following is: tries to limit sugar drinks.     Meal times are: Usually 2 meals a day, variable times  Typical meal intake: Breakfast is eggs and toast               Dietician visit, most recent: Never  Weight history:  Wt Readings from Last 3 Encounters:  04/27/21 242 lb 9.6 oz (110 kg)  04/25/21 241 lb 8 oz (109.5 kg)  03/30/21 248 lb 3.2 oz (112.6 kg)    Glycemic control:   Lab Results  Component Value Date   HGBA1C 11.8 (A) 03/30/2021   HGBA1C 10.3 (H) 09/05/2020   HGBA1C 7.8 (A) 07/19/2018   Lab Results  Component Value Date   MICROALBUR 36.2 (H) 09/05/2020  LDLCALC 78 09/05/2020   CREATININE 0.96 04/22/2021   Lab Results  Component Value Date   MICRALBCREAT 24.5 09/05/2020    Lab Results  Component Value Date   FRUCTOSAMINE 345 (H) 04/22/2021   FRUCTOSAMINE 431 (H) 03/27/2021   FRUCTOSAMINE 394 (H) 04/13/2018      Allergies as of 04/27/2021       Reactions   Canagliflozin    Other reaction(s): dizziness, stabbing pain in right side of body   Hydrocodone Hypertension        Medication List        Accurate as of April 27, 2021  3:14 PM. If you have any questions, ask your nurse or doctor.          Accu-Chek Guide test strip Generic drug: glucose blood Use as instructed to check blood sugar twice daily. What changed: additional instructions Changed by: Elayne Snare, MD   Accu-Chek Softclix Lancets lancets Use to check blood sugar twice daily. Started by: Elayne Snare, MD    amLODipine 10 MG tablet Commonly known as: NORVASC TAKE 1 TABLET EVERY DAY ONCE A DAY ORALLY 90 What changed:  how much to take how to take this when to take this additional instructions   carvedilol 25 MG tablet Commonly known as: COREG Take by mouth.   clotrimazole 1 % cream Commonly known as: LOTRIMIN Apply 1 application topically in the morning and at bedtime.   cyclobenzaprine 10 MG tablet Commonly known as: FLEXERIL Take 10 mg by mouth every 6 (six) hours as needed for muscle spasms.   Dexcom G6 Sensor Misc Use to monitor blood sugar, change after 10 days   Farxiga 5 MG Tabs tablet Generic drug: dapagliflozin propanediol Take by mouth.   Insulin Syringe-Needle U-100 30G X 1/2" 0.3 ML Misc Use twice a day with insulin   loratadine 10 MG tablet Commonly known as: CLARITIN Take 10 mg by mouth daily as needed for allergies.   losartan 100 MG tablet Commonly known as: COZAAR Take 100 mg by mouth daily.   losartan-hydrochlorothiazide 100-25 MG tablet Commonly known as: HYZAAR Take 1 tablet by mouth every morning.   metFORMIN 1000 MG tablet Commonly known as: GLUCOPHAGE Take 1,000 mg by mouth 2 (two) times daily.   metoprolol succinate 100 MG 24 hr tablet Commonly known as: TOPROL-XL Take 100 mg by mouth daily.   naproxen 500 MG tablet Commonly known as: NAPROSYN Take 500 mg by mouth 2 (two) times daily as needed for moderate pain.   NovoLIN 70/30 Kwikpen (70-30) 100 UNIT/ML KwikPen Generic drug: insulin isophane & regular human KwikPen Inject 30 Units into the skin in the morning and at bedtime.   ondansetron 4 MG tablet Commonly known as: ZOFRAN Take 4 mg by mouth every 6 (six) hours as needed for vomiting or nausea.   ONE-A-DAY WOMENS PO Take 1 tablet by mouth daily.   oxyCODONE-acetaminophen 5-325 MG tablet Commonly known as: PERCOCET/ROXICET Take 1 tablet by mouth every 6 (six) hours as needed for up to 5 doses for severe pain.   potassium  chloride SA 20 MEQ tablet Commonly known as: Klor-Con M20 Take 1 tablet (20 mEq total) by mouth daily.   rosuvastatin 20 MG tablet Commonly known as: CRESTOR Take 20 mg by mouth at bedtime.   spironolactone 25 MG tablet Commonly known as: ALDACTONE Take 1 tablet by mouth daily.   tiZANidine 2 MG tablet Commonly known as: ZANAFLEX Take 2 mg by mouth every 12 (twelve) hours as needed for  muscle pain.        Allergies:  Allergies  Allergen Reactions   Canagliflozin     Other reaction(s): dizziness, stabbing pain in right side of body   Hydrocodone Hypertension    Past Medical History:  Diagnosis Date   Abnormal EKG    LVH with strain   Acid reflux    Depression    Diabetes mellitus    A1C over 9   HLD (hyperlipidemia)    Hypertension    Noncompliance    Obesity     Past Surgical History:  Procedure Laterality Date   COLONOSCOPY WITH PROPOFOL N/A 04/29/2015   Procedure: COLONOSCOPY WITH PROPOFOL;  Surgeon: Garlan Fair, MD;  Location: [facility];  Service: Endoscopy;  Laterality: N/A;   HYSTEROSCOPY WITH D & C N/A 09/07/2017   Procedure: DILATATION AND CURETTAGE /HYSTEROSCOPY;  Surgeon: Sloan Leiter, MD;  Location: [place];  Service: Gynecology;  Laterality: N/A;   KNEE ARTHROSCOPY W/ MENISCAL REPAIR Right     Family History  Problem Relation Age of Onset   Cancer Mother    Hypertension Mother    Diabetes Mother    Breast cancer Maternal Aunt    Breast cancer Cousin     Social History:  reports that she has never smoked. She has never used smokeless tobacco. She reports that she does not currently use alcohol. She reports that she does not use drugs.   Review of Systems  Psychiatric/Behavioral:  Positive for depressed mood.    Lipid history:  Treated with Zocor 20 mg daily Her last LDL was below 100    Lab Results  Component Value Date   CHOL 163 09/05/2020   HDL 48.00 09/05/2020   LDLCALC 78 09/05/2020   TRIG 187.0 (H)  09/05/2020   CHOLHDL 3 09/05/2020           Hypertension: She is on losartan 100, Aldactone 25 mg, carvedilol and Norvasc from cardiologist  Her wrist instrument at home reads about 150/100  Blood pressure with a cardiologist recently was 150/70  BP Readings from Last 3 Encounters:  04/27/21 122/80  04/25/21 (!) 155/88  03/30/21 130/82    Hypokalemia: Has not been taking any potassium supplements and potassium is low again despite starting 25 mg of Aldactone recently  Lab Results  Component Value Date   K 3.4 (L) 04/22/2021       LABS:  Lab on 04/22/2021  Component Date Value Ref Range Status   Sodium 04/22/2021 140  135 - 145 mEq/L Final   Potassium 04/22/2021 3.4 (A)  3.5 - 5.1 mEq/L Final   Chloride 04/22/2021 106  96 - 112 mEq/L Final   CO2 04/22/2021 24  19 - 32 mEq/L Final   Glucose, Bld 04/22/2021 246 (A)  70 - 99 mg/dL Final   BUN 04/22/2021 23  6 - 23 mg/dL Final   Creatinine, Ser 04/22/2021 0.96  0.40 - 1.20 mg/dL Final   GFR 04/22/2021 64.47  >60.00 mL/min Final   Calculated using the CKD-EPI Creatinine Equation (2021)   Calcium 04/22/2021 9.4  8.4 - 10.5 mg/dL Final   Fructosamine 04/22/2021 345 (A)  0 - 285 umol/L Final   Comment: Published reference interval for apparently healthy subjects between age 85 and 24 is 22 - 285 umol/L and in a poorly controlled diabetic population is 228 - 563 umol/L with a mean of 396 umol/L.     Physical Examination:  BP 122/80   Pulse 91  Ht 5\' 6"  (1.676 m)   Wt 242 lb 9.6 oz (110 kg)   LMP 08/02/2013 Comment: spotting  SpO2 98%   BMI 39.16 kg/m         ASSESSMENT:  Diabetes type 2, uncontrolled with morbid obesity  See history of present illness for detailed discussion of current diabetes management, blood sugar patterns and problems identified  Her A1c is last higher at 11.8 She is on premixed insulin and metformin and recently 2.5 mg Farxiga  Her blood sugars have been consistently poorly  controlled except recently  As above she is not following instructions for her insulin and glucose monitoring and difficult to get a blood sugar pattern With her fructosamine being higher she still likely has high postprandial readings that are not available She is not understanding the need to cover all her meals with insulin  Previously had nausea with Rybelsus and likely with other GLP-1 drugs Currently appears to be tolerating 2.5 mg Farxiga but not clear how much she is benefiting  Hypercholesterolemia: Controlled with Zocor   HYPERTENSION: She has better control now and is on a new medication regimen including 25 mg Aldactone  Again has hypokalemia despite stopping HCTZ   PLAN:   Discussed actions of premixed insulin and coverage of postprandial hyperglycemia in detail  Advised her to follow instructions on her visit summary instead of empirically taking her insulin  She will need to take her insulin with her when she is eating lunch and take at least 10 units She will increase the dose 2 to 4 units if readings after lunch are more than 180  For now she can continue 25 units in the evening but take it consistently 15 to 20 minutes before dinner Again she can increase the dose of postprandial readings are higher Continue [place]  Since she is not able to get Dexcom covered she will need to check her blood sugars more often and at least twice a day by rotation after meals and some in the morning  She will be given an Accu-Chek meter and she can try to use this instead of [employer] meter  Restart walking when able to  For her hypertension she will increase her spironolactone to twice a day to help with consistent blood pressure control and her potassium She will likely need to use an arm blood pressure cuff as her wrist instrument is reading falsely high She will have her potassium rechecked with her upcoming visit with cardiologist    Patient Instructions  Take spironolactone 2x  daily  Take evening insulin consistently before supper preferably 15 to 30 minutes before eating  Take 10 units of insulin before your first meal of the day which may be lunchtime Check some blood sugars couple of hours after lunch and supper  If the blood sugars after any given meal is over 180 increase the insulin for that meal by 4 units  Check the blood pressure with an arm blood pressure monitor preferably                Elayne Snare 04/27/2021, 3:14 PM   Note: This office note was prepared with Dragon voice recognition system technology. Any transcriptional errors that result from this process are unintentional.

## 2021-04-30 ENCOUNTER — Other Ambulatory Visit: Payer: Self-pay

## 2021-04-30 ENCOUNTER — Ambulatory Visit (INDEPENDENT_AMBULATORY_CARE_PROVIDER_SITE_OTHER): Payer: BC Managed Care – PPO

## 2021-04-30 ENCOUNTER — Ambulatory Visit (INDEPENDENT_AMBULATORY_CARE_PROVIDER_SITE_OTHER): Payer: BC Managed Care – PPO | Admitting: Podiatry

## 2021-04-30 ENCOUNTER — Encounter: Payer: Self-pay | Admitting: Podiatry

## 2021-04-30 ENCOUNTER — Other Ambulatory Visit: Payer: Self-pay | Admitting: Podiatry

## 2021-04-30 DIAGNOSIS — M722 Plantar fascial fibromatosis: Secondary | ICD-10-CM

## 2021-04-30 DIAGNOSIS — M778 Other enthesopathies, not elsewhere classified: Secondary | ICD-10-CM

## 2021-04-30 DIAGNOSIS — E785 Hyperlipidemia, unspecified: Secondary | ICD-10-CM | POA: Insufficient documentation

## 2021-04-30 DIAGNOSIS — M7750 Other enthesopathy of unspecified foot: Secondary | ICD-10-CM

## 2021-04-30 MED ORDER — TRIAMCINOLONE ACETONIDE 40 MG/ML IJ SUSP
40.0000 mg | Freq: Once | INTRAMUSCULAR | Status: AC
  Administered 2021-04-30: 40 mg

## 2021-04-30 NOTE — Patient Instructions (Signed)

## 2021-04-30 NOTE — Progress Notes (Signed)
Subjective:  Patient ID: Shannon Oliver, female    DOB: 17-Oct-1960,  MRN: [id number] HPI Chief Complaint  Patient presents with   Foot Pain    Lateral ankle left - woke up one morning last week and felt like her ankle was twisted, no injury, swelling, sharp pains intermittent, PCP rx'd oxycodone, but no eval or treatment, relates trouble with feet and ankles for years   Diabetes    Last a1c was 11.0   New Patient (Initial Visit)    60 y.o. female presents with the above complaint.   ROS: Denies fever chills nausea vomiting muscle aches pains calf pain back pain chest pain shortness of breath.  Past Medical History:  Diagnosis Date   Abnormal EKG    LVH with strain   Acid reflux    Depression    Diabetes mellitus    A1C over 9   HLD (hyperlipidemia)    Hypertension    Noncompliance    Obesity    Past Surgical History:  Procedure Laterality Date   COLONOSCOPY WITH PROPOFOL N/A 04/29/2015   Procedure: COLONOSCOPY WITH PROPOFOL;  Surgeon: Garlan Fair, MD;  Location: [facility];  Service: Endoscopy;  Laterality: N/A;   HYSTEROSCOPY WITH D & C N/A 09/07/2017   Procedure: DILATATION AND CURETTAGE /HYSTEROSCOPY;  Surgeon: Sloan Leiter, MD;  Location: [place];  Service: Gynecology;  Laterality: N/A;   KNEE ARTHROSCOPY W/ MENISCAL REPAIR Right     Current Outpatient Medications:    Accu-Chek Softclix Lancets lancets, Use to check blood sugar twice daily., Disp: 100 each, Rfl: 3   amLODipine (NORVASC) 10 MG tablet, TAKE 1 TABLET EVERY DAY ONCE A DAY ORALLY 90 (Patient taking differently: Take 10 mg by mouth daily.), Disp: 30 tablet, Rfl: 1   amoxicillin (AMOXIL) 500 MG capsule, SMARTSIG:2 Capsule(s) By Mouth Every 12 Hours, Disp: , Rfl:    benzonatate (TESSALON) 200 MG capsule, , Disp: , Rfl:    carvedilol (COREG) 25 MG tablet, Take by mouth., Disp: , Rfl:    clotrimazole (LOTRIMIN) 1 % cream, Apply 1 application topically in the morning and at bedtime.  (Patient not taking: Reported on 04/27/2021), Disp: , Rfl:    Continuous Blood Gluc Sensor (DEXCOM G6 SENSOR) MISC, Use to monitor blood sugar, change after 10 days, Disp: 3 each, Rfl: 3   cyclobenzaprine (FLEXERIL) 10 MG tablet, Take 10 mg by mouth every 6 (six) hours as needed for muscle spasms., Disp: , Rfl:    dapagliflozin propanediol (FARXIGA) 5 MG TABS tablet, Take by mouth., Disp: , Rfl:    escitalopram (LEXAPRO) 10 MG tablet, Take 10 mg by mouth at bedtime., Disp: , Rfl:    glucose blood (ACCU-CHEK GUIDE) test strip, Use as instructed to check blood sugar twice daily., Disp: 100 each, Rfl: 12   Insulin Syringe-Needle U-100 30G X 1/2" 0.3 ML MISC, Use twice a day with insulin, Disp: 100 each, Rfl: 0   loratadine (CLARITIN) 10 MG tablet, Take 10 mg by mouth daily as needed for allergies. (Patient not taking: Reported on 04/27/2021), Disp: , Rfl:    losartan (COZAAR) 100 MG tablet, Take 100 mg by mouth daily., Disp: , Rfl:    losartan-hydrochlorothiazide (HYZAAR) 100-25 MG per tablet, Take 1 tablet by mouth every morning.  (Patient not taking: Reported on 04/27/2021), Disp: , Rfl:    metFORMIN (GLUCOPHAGE) 1000 MG tablet, Take 1,000 mg by mouth 2 (two) times daily., Disp: , Rfl:    metoprolol succinate (TOPROL-XL)  100 MG 24 hr tablet, Take 100 mg by mouth daily.  (Patient not taking: Reported on 04/27/2021), Disp: , Rfl: 0   Multiple Vitamins-Calcium (ONE-A-DAY WOMENS PO), Take 1 tablet by mouth daily., Disp: , Rfl:    naproxen (NAPROSYN) 500 MG tablet, Take 500 mg by mouth 2 (two) times daily as needed for moderate pain. (Patient not taking: Reported on 04/27/2021), Disp: , Rfl:    NOVOLIN 70/30 FLEXPEN (70-30) 100 UNIT/ML KwikPen, Inject 30 Units into the skin in the morning and at bedtime., Disp: , Rfl:    ondansetron (ZOFRAN) 4 MG tablet, Take 4 mg by mouth every 6 (six) hours as needed for vomiting or nausea., Disp: , Rfl:    oxyCODONE-acetaminophen (PERCOCET/ROXICET) 5-325 MG tablet, Take  1 tablet by mouth every 6 (six) hours as needed for up to 5 doses for severe pain., Disp: 5 tablet, Rfl: 0   potassium chloride SA (KLOR-CON M20) 20 MEQ tablet, Take 1 tablet (20 mEq total) by mouth daily. (Patient not taking: Reported on 04/27/2021), Disp: 30 tablet, Rfl: 1   rosuvastatin (CRESTOR) 20 MG tablet, Take 20 mg by mouth at bedtime. (Patient not taking: Reported on 04/27/2021), Disp: , Rfl:    spironolactone (ALDACTONE) 25 MG tablet, Take 1 tablet by mouth daily., Disp: , Rfl:    tiZANidine (ZANAFLEX) 2 MG tablet, Take 2 mg by mouth every 12 (twelve) hours as needed for muscle pain., Disp: , Rfl:   Allergies  Allergen Reactions   Canagliflozin     Other reaction(s): dizziness, stabbing pain in right side of body   Hydrocodone Hypertension   Review of Systems Objective:  There were no vitals filed for this visit.  General: Well developed, nourished, in no acute distress, alert and oriented x3   Dermatological: Skin is warm, dry and supple bilateral. Nails x 10 are well maintained; remaining integument appears unremarkable at this time. There are no open sores, no preulcerative lesions, no rash or signs of infection present.  Vascular: Dorsalis Pedis artery and Posterior Tibial artery pedal pulses are 2/4 bilateral with immedate capillary fill time. Pedal hair growth present. No varicosities and no lower extremity edema present bilateral.   Neruologic: Grossly intact via light touch bilateral. Vibratory intact via tuning fork bilateral. Protective threshold with Semmes Wienstein monofilament intact to all pedal sites bilateral. Patellar and Achilles deep tendon reflexes 2+ bilateral. No Babinski or clonus noted bilateral.   Musculoskeletal: No gross boney pedal deformities bilateral. No pain, crepitus, or limitation noted with foot and ankle range of motion bilateral. Muscular strength 5/5 in all groups tested bilateral.  Pain on palpation dorsal aspect of the bilateral foot as well  as pain on palpation medial calcaneal tubercle of the bilateral heels.  No pain on medial lateral compression of the calcaneus mild tenderness on palpation of the sinus tarsi and the peroneal tendons bilateral.  Gait: Unassisted, Nonantalgic.    Radiographs:  Radiographs taken today demonstrate osseously mature individual mild osteopenia.  Soft tissue swelling of plantar calcaneal insertion site mild small plantar distally oriented calcaneal heel spurs some joint space narrowing of the second TMT joint with dorsal spurring on lateral view.  Joint space narrowing AP view.  Assessment & Plan:   Assessment: Diabetic peripheral neuropathy leading to a lot of her problem I think.  She also has Planter fasciitis bilateral dorsal osteoarthritis.  Plan: Discussed etiology pathology conservative versus surgical therapies.  At this point I highly recommended that she start on her gabapentin 300 mg  that she has at home.  1 p.o. twice daily.  I also injected the bilateral heels today 20 mg Kenalog 5 mg Marcaine point maximal tenderness and discussed the shoe gear modifications and appropriate shoe gear.  And dispensed plantar fascial braces bilateral.  I like to follow-up with her in 1 month for plantar fascial check and to reevaluate the neuropathy.     Shannon Oliver T. [place], [place]

## 2021-06-04 HISTORY — PX: CATARACT EXTRACTION: SUR2

## 2021-06-05 ENCOUNTER — Emergency Department (HOSPITAL_BASED_OUTPATIENT_CLINIC_OR_DEPARTMENT_OTHER): Payer: BC Managed Care – PPO

## 2021-06-05 ENCOUNTER — Emergency Department (HOSPITAL_BASED_OUTPATIENT_CLINIC_OR_DEPARTMENT_OTHER)
Admission: EM | Admit: 2021-06-05 | Discharge: 2021-06-05 | Disposition: A | Discharge status: Home / Self Care | Payer: BC Managed Care – PPO | Attending: Emergency Medicine | Admitting: Emergency Medicine

## 2021-06-05 ENCOUNTER — Encounter (HOSPITAL_BASED_OUTPATIENT_CLINIC_OR_DEPARTMENT_OTHER): Payer: Self-pay

## 2021-06-05 ENCOUNTER — Other Ambulatory Visit: Payer: Self-pay

## 2021-06-05 ENCOUNTER — Emergency Department (HOSPITAL_COMMUNITY)
Admission: EM | Admit: 2021-06-05 | Discharge: 2021-06-05 | Discharge status: Against Medical Advice | Payer: BC Managed Care – PPO

## 2021-06-05 DIAGNOSIS — Z6839 Body mass index (BMI) 39.0-39.9, adult: Secondary | ICD-10-CM | POA: Diagnosis not present

## 2021-06-05 DIAGNOSIS — R519 Headache, unspecified: Secondary | ICD-10-CM | POA: Diagnosis present

## 2021-06-05 DIAGNOSIS — Z79899 Other long term (current) drug therapy: Secondary | ICD-10-CM | POA: Insufficient documentation

## 2021-06-05 DIAGNOSIS — Z9842 Cataract extraction status, left eye: Secondary | ICD-10-CM | POA: Diagnosis not present

## 2021-06-05 DIAGNOSIS — Z7984 Long term (current) use of oral hypoglycemic drugs: Secondary | ICD-10-CM | POA: Insufficient documentation

## 2021-06-05 DIAGNOSIS — Z803 Family history of malignant neoplasm of breast: Secondary | ICD-10-CM | POA: Insufficient documentation

## 2021-06-05 DIAGNOSIS — I729 Aneurysm of unspecified site: Secondary | ICD-10-CM

## 2021-06-05 DIAGNOSIS — E119 Type 2 diabetes mellitus without complications: Secondary | ICD-10-CM | POA: Insufficient documentation

## 2021-06-05 DIAGNOSIS — I720 Aneurysm of carotid artery: Secondary | ICD-10-CM | POA: Diagnosis not present

## 2021-06-05 DIAGNOSIS — I10 Essential (primary) hypertension: Secondary | ICD-10-CM | POA: Diagnosis not present

## 2021-06-05 LAB — CBC WITH DIFFERENTIAL/PLATELET
Abs Immature Granulocytes: 0.01 10*3/uL (ref 0.00–0.07)
Basophils Absolute: 0 10*3/uL (ref 0.0–0.1)
Basophils Relative: 1 %
Eosinophils Absolute: 0.1 10*3/uL (ref 0.0–0.5)
Eosinophils Relative: 2 %
HCT: 35.7 % — ABNORMAL LOW (ref 36.0–46.0)
Hemoglobin: 11.4 g/dL — ABNORMAL LOW (ref 12.0–15.0)
Immature Granulocytes: 0 %
Lymphocytes Relative: 36 %
Lymphs Abs: 1.8 10*3/uL (ref 0.7–4.0)
MCH: 29.3 pg (ref 26.0–34.0)
MCHC: 31.9 g/dL (ref 30.0–36.0)
MCV: 91.8 fL (ref 80.0–100.0)
Monocytes Absolute: 0.5 10*3/uL (ref 0.1–1.0)
Monocytes Relative: 10 %
Neutro Abs: 2.5 10*3/uL (ref 1.7–7.7)
Neutrophils Relative %: 51 %
Platelets: 196 10*3/uL (ref 150–400)
RBC: 3.89 MIL/uL (ref 3.87–5.11)
RDW: 14.9 % (ref 11.5–15.5)
WBC: 5 10*3/uL (ref 4.0–10.5)
nRBC: 0 % (ref 0.0–0.2)

## 2021-06-05 LAB — PROTEIN, CSF: Total  Protein, CSF: 41 mg/dL (ref 15–45)

## 2021-06-05 LAB — CSF CELL COUNT WITH DIFFERENTIAL
RBC Count, CSF: 217 /mm3 — ABNORMAL HIGH
RBC Count, CSF: 4 /mm3 — ABNORMAL HIGH
Tube #: 1
Tube #: 4
WBC, CSF: 0 /mm3 (ref 0–5)
WBC, CSF: 1 /mm3 (ref 0–5)

## 2021-06-05 LAB — GLUCOSE, CSF: Glucose, CSF: 92 mg/dL — ABNORMAL HIGH (ref 40–70)

## 2021-06-05 LAB — BASIC METABOLIC PANEL
Anion gap: 8 (ref 5–15)
BUN: 17 mg/dL (ref 6–20)
CO2: 26 mmol/L (ref 22–32)
Calcium: 9.7 mg/dL (ref 8.9–10.3)
Chloride: 105 mmol/L (ref 98–111)
Creatinine, Ser: 0.82 mg/dL (ref 0.44–1.00)
GFR, Estimated: >60 mL/min (ref >60)
Glucose, Bld: 144 mg/dL — ABNORMAL HIGH (ref 70–99)
Potassium: 3.8 mmol/L (ref 3.5–5.1)
Sodium: 139 mmol/L (ref 135–145)

## 2021-06-05 IMAGING — CT CT ANGIO HEAD
3 of 12 series · 17 of 47 positions shown · IV contrast (APPLIED)
Comparison: Head CT [DATE].

CLINICAL DATA: Neuro deficit, acute, stroke suspected. Additional
history provided: Severe headache, left-sided, concern for aneurysm,
stroke, subarachnoid hemorrhage.

EXAM:
CT ANGIOGRAPHY HEAD
TECHNIQUE: Multidetector CT imaging of the head was performed using the
standard protocol during bolus administration of intravenous
contrast. Multiplanar CT image reconstructions and MIPs were
obtained to evaluate the vascular anatomy.
CONTRAST:  75mL OMNIPAQUE IOHEXOL 350 MG/ML SOLN

[Series 8: ax thin · axial · 0.39mm/px · z∈[-253,-117]mm · 12 of 169 slices shown]
[im 13/169  brain]
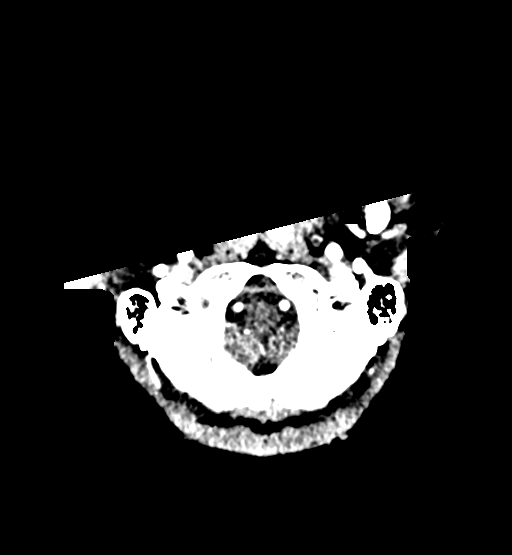
[im 26/169  bone]
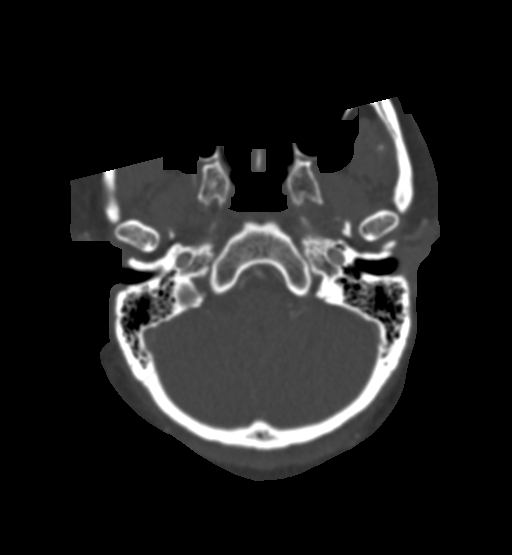
[im 39/169  brain]
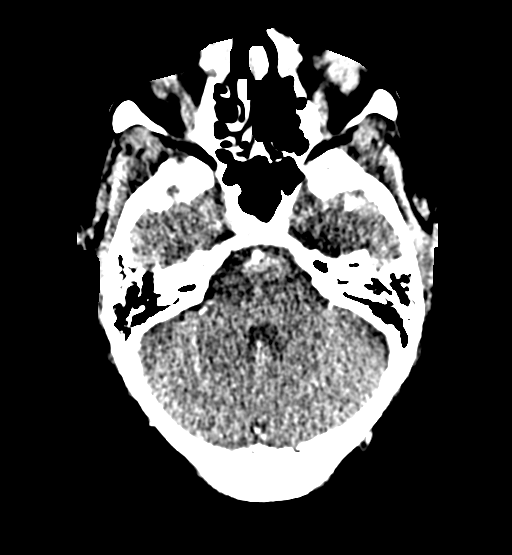
[im 52/169  bone]
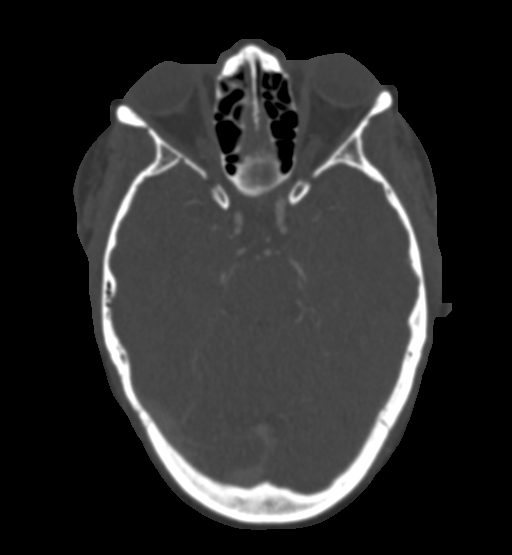
[im 65/169  brain]
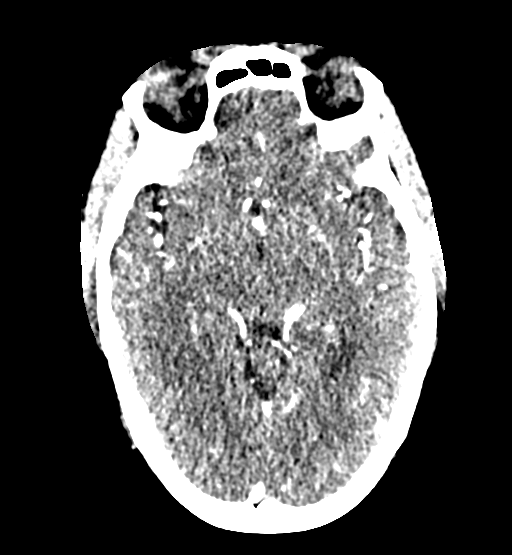
[im 78/169  bone]
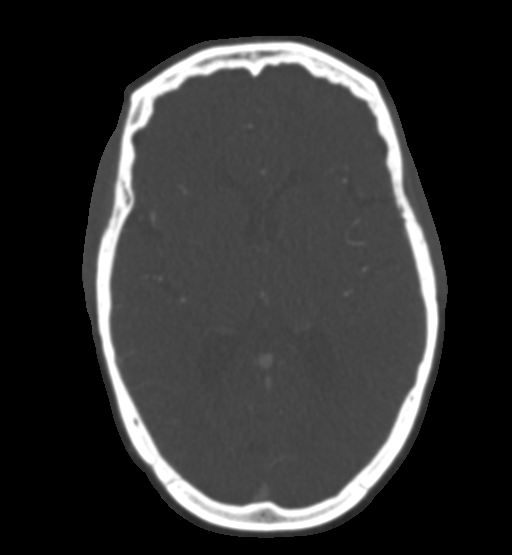
[im 91/169  brain]
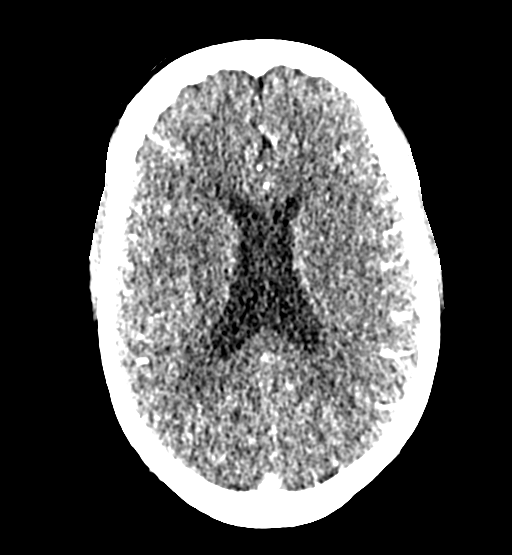
[im 104/169  bone]
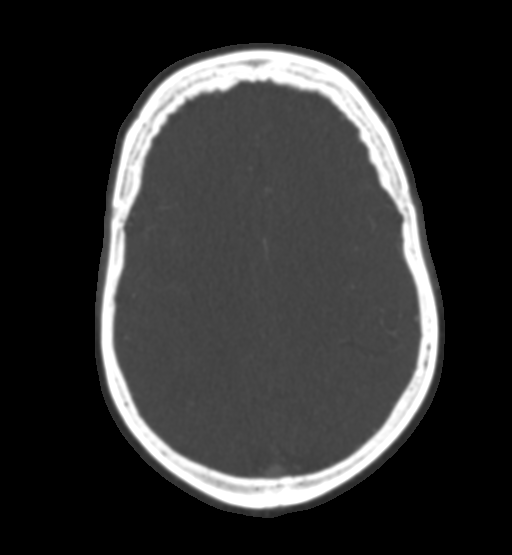
[im 117/169  brain]
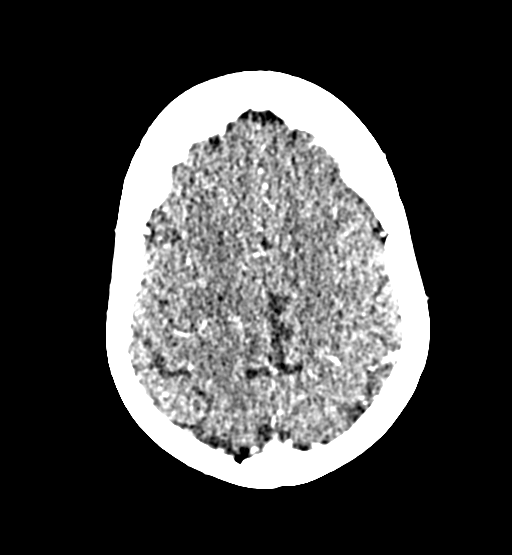
[im 130/169  bone]
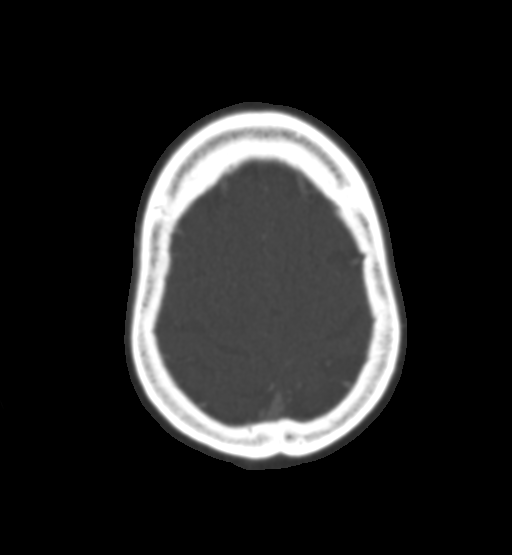
[im 143/169  brain]
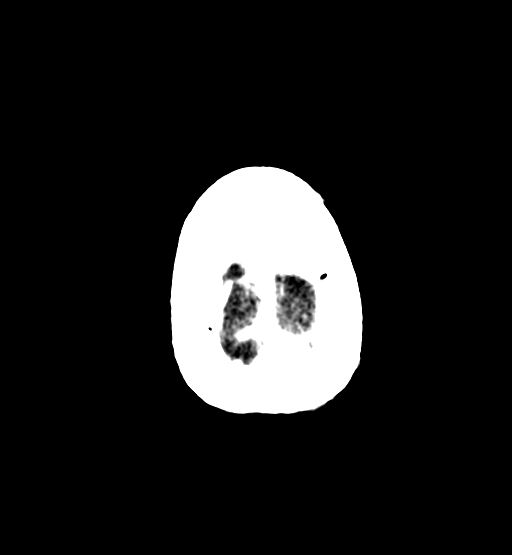
[im 156/169  bone]
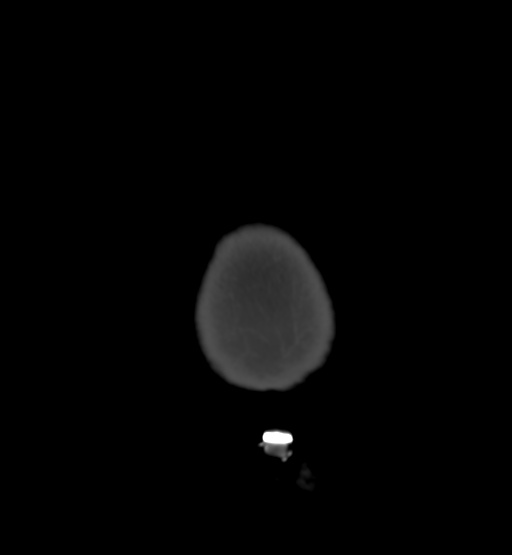

[Series 9: cor thin · coronal · 0.33mm/px · 2 of 211 slices shown]
[im 71/211  brain]
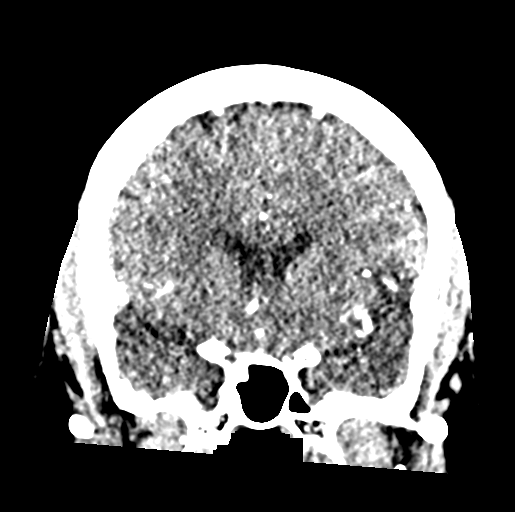
[im 141/211  brain]
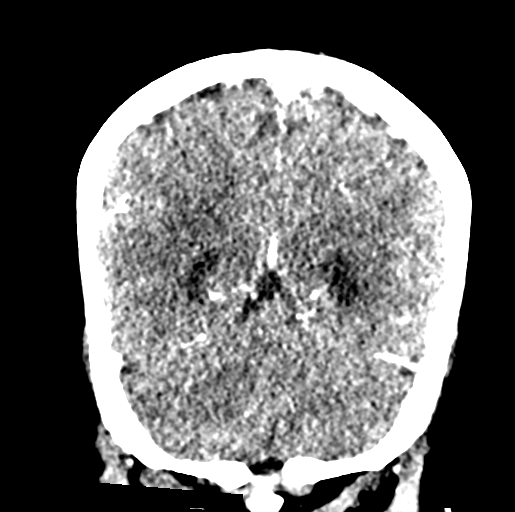

[Series 14: sag thin · sagittal · 0.33mm/px · 3 of 177 slices shown]
[im 34/177  brain]
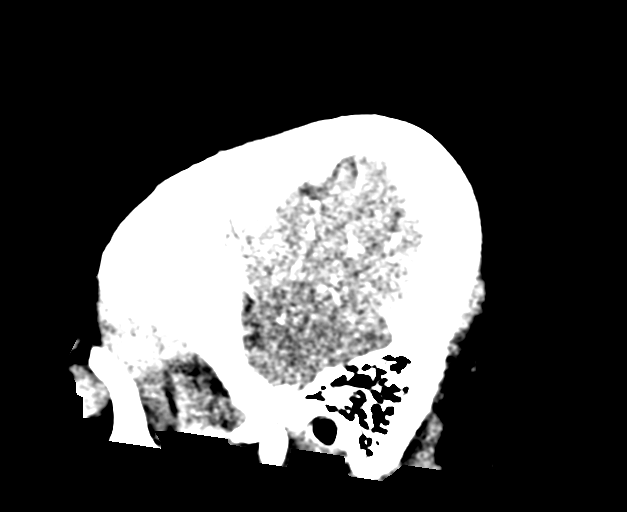
[im 68/177  brain]
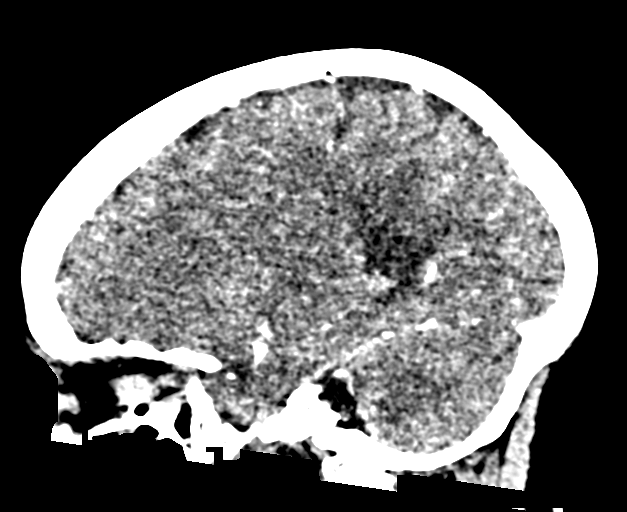
[im 102/177  brain]
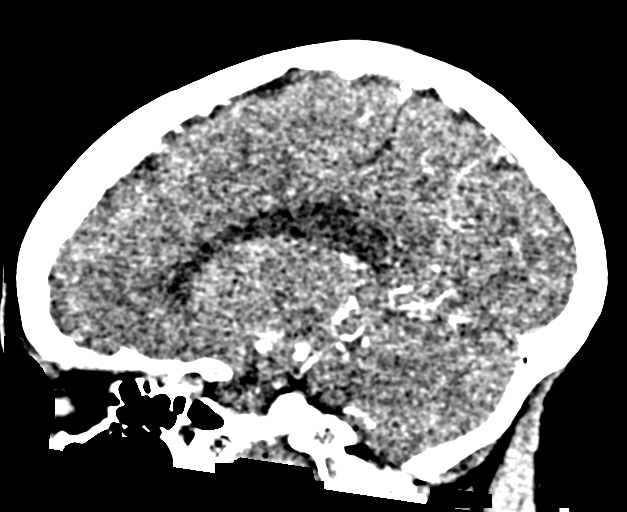

[17 of 47 positions shown; findings below may reference images not displayed]

FINDINGS: CT HEAD

Brain:

Cerebral volume is normal for age.

Patchy and ill-defined hypoattenuation within the cerebral white
matter, nonspecific but most often secondary to chronic small vessel
ischemia.

There is no acute intracranial hemorrhage.

No demarcated cortical infarct.

No extra-axial fluid collection.

No evidence of an intracranial mass.

No midline shift.

Vascular: No hyperdense vessel.

Skull: Normal. Negative for fracture or focal lesion.

Orbits: No acute or significant orbital finding.

Sinuses: No significant paranasal sinus disease at the imaged
levels.

CTA HEAD

Anterior circulation:

The intracranial internal carotid arteries are patent. The M1 middle
cerebral arteries are patent. No M2 proximal branch occlusion or
high-grade proximal stenosis is identified. The anterior cerebral
arteries are patent. The right A1 segment is hypoplastic.

3 mm superomedially projecting aneurysm arising from the distal
cavernous right ICA (series 8, image 121).

4 mm superiorly projecting aneurysm arising from the left ICA
terminus at the origin of the left anterior and middle cerebral
arteries (series 8, image 110) (series 10, image 128).

Posterior circulation:

The intracranial vertebral arteries are patent. The basilar artery
is patent. The posterior cerebral arteries are patent. Posterior
communicating arteries are present bilaterally.

Venous sinuses: Within the limitations of contrast timing, no
convincing thrombus.

Anatomic variants: As described.

Review of the MIP images confirms the above findings.
IMPRESSION: CT head:

1. No evidence of acute intracranial hemorrhage or acute infarction.
2. Patchy and ill-defined hypoattenuation within the cerebral white
matter, nonspecific but most often secondary to chronic small vessel
ischemia.

CTA head:

1. 4 mm superiorly projecting aneurysm arising from the left ICA
terminus, at the origin of the left anterior and middle cerebral
arteries.
2. 3 mm superomedially projecting aneurysm arising from the distal
cavernous right ICA.
3. No intracranial large vessel occlusion or proximal high-grade
arterial stenosis.

## 2021-06-05 MED ORDER — IOHEXOL 350 MG/ML SOLN
100.0000 mL | Freq: Once | INTRAVENOUS | Status: AC | PRN
  Administered 2021-06-05: 75 mL via INTRAVENOUS

## 2021-06-05 MED ORDER — ACETAMINOPHEN 325 MG PO TABS
650.0000 mg | ORAL_TABLET | Freq: Once | ORAL | Status: AC
  Administered 2021-06-05: 650 mg via ORAL
  Filled 2021-06-05: qty 2

## 2021-06-05 MED ORDER — PROCHLORPERAZINE EDISYLATE 10 MG/2ML IJ SOLN
10.0000 mg | Freq: Once | INTRAMUSCULAR | Status: AC
  Administered 2021-06-05: 10 mg via INTRAVENOUS
  Filled 2021-06-05: qty 2

## 2021-06-05 MED ORDER — LIDOCAINE HCL (PF) 1 % IJ SOLN
5.0000 mL | Freq: Once | INTRAMUSCULAR | Status: AC
  Administered 2021-06-05: 5 mL
  Filled 2021-06-05: qty 5

## 2021-06-05 MED ORDER — DIPHENHYDRAMINE HCL 50 MG/ML IJ SOLN
25.0000 mg | Freq: Once | INTRAMUSCULAR | Status: AC
  Administered 2021-06-05: 25 mg via INTRAVENOUS
  Filled 2021-06-05: qty 1

## 2021-06-05 MED ORDER — TETRACAINE HCL 0.5 % OP SOLN
1.0000 [drp] | Freq: Once | OPHTHALMIC | Status: AC
  Administered 2021-06-05: 1 [drp] via OPHTHALMIC
  Filled 2021-06-05: qty 4

## 2021-06-05 MED ORDER — DIPHENHYDRAMINE HCL 50 MG/ML IJ SOLN
12.5000 mg | Freq: Once | INTRAMUSCULAR | Status: AC
  Administered 2021-06-05: 12.5 mg via INTRAVENOUS
  Filled 2021-06-05: qty 1

## 2021-06-05 MED ORDER — METOCLOPRAMIDE HCL 5 MG/ML IJ SOLN
10.0000 mg | Freq: Once | INTRAMUSCULAR | Status: AC
  Administered 2021-06-05: 10 mg via INTRAVENOUS
  Filled 2021-06-05: qty 2

## 2021-06-05 NOTE — Discharge Instructions (Addendum)
Please call your ophthalmology office to reschedule your appointment and contact the neuro interventional list, Dr. 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  If you develop worsening headache, vomiting, or other new concerning symptom, return to ER for reassessment.

## 2021-06-05 NOTE — ED Notes (Signed)
Lab contacted re: cell count CSF. [facility] lab reaching out to [facility] main lab re send out cell count.

## 2021-06-05 NOTE — ED Notes (Signed)
Pt alert, NAD, calm, interactive, resps e/u, speaking in clear complete sentences, prefers eyes closed, covered with hand, states, "feel better, but still hurting". Denies nausea or other sx.

## 2021-06-05 NOTE — ED Notes (Signed)
EDP at [facility] for LP.

## 2021-06-05 NOTE — ED Provider Notes (Signed)
[facility] EMERGENCY DEPT Provider Note   CSN: [id number] Arrival date & time: 06/05/21  0540     History Chief Complaint  Patient presents with   Headache    Shannon Oliver is a 60 y.o. female.  Presenting to the emergency room with concern for headache.  Reports that yesterday morning she started having headache, intermittent sharp pains at the back of her left head.  Initially mild, progressively worse.  Had cataract surgery yesterday, not aware of any complications, has follow-up today at noon with her ophthalmologist.  Headache more severe today.  Up to 10 out of 10 in severity.  Still sharp and stabbing sensation.  Tried Tylenol at home with no relief.  No neck pain or neck swelling.  No speech change, no numbness or weakness.  No loss of vision in right eye.  Left eye has not tried opening since surgery.  HPI     Past Medical History:  Diagnosis Date   Abnormal EKG    LVH with strain   Acid reflux    Depression    Diabetes mellitus    A1C over 9   HLD (hyperlipidemia)    Hypertension    Noncompliance    Obesity     Patient Active Problem List   Diagnosis Date Noted   Hyperlipidemia 04/30/2021   BMI 39.0-39.9,adult 04/17/2021   Elevated troponin 10/30/2020   AKI (acute kidney injury) ([place]) 10/30/2020   Palpitations    Chest pain 10/29/2020   Encounter for orthopedic follow-up care 08/19/2020   Pain in joint of left shoulder 05/14/2020   Hypertensive left ventricular hypertrophy, without heart failure 12/18/2018   Obesity due to excess calories without serious comorbidity 12/18/2018   Type 2 diabetes mellitus without complication, without long-term current use of insulin ([place]) 12/15/2018   Postmenopausal bleeding 06/05/2017   Neck pain 03/01/2014   Edema 01/25/2013   DM (diabetes mellitus) ([place]) 01/25/2013   Dyspnea 01/25/2013   Abnormal ECG 01/25/2013   Hypertension    Acid reflux     Past Surgical History:  Procedure Laterality Date    CATARACT EXTRACTION Left 06/04/2021   COLONOSCOPY WITH PROPOFOL N/A 04/29/2015   Procedure: COLONOSCOPY WITH PROPOFOL;  Surgeon: Garlan Fair, MD;  Location: [facility];  Service: Endoscopy;  Laterality: N/A;   HYSTEROSCOPY WITH D & C N/A 09/07/2017   Procedure: DILATATION AND CURETTAGE /HYSTEROSCOPY;  Surgeon: Sloan Leiter, MD;  Location: [facility];  Service: Gynecology;  Laterality: N/A;   KNEE ARTHROSCOPY W/ MENISCAL REPAIR Right      OB History     Gravida  3   Para  3   Term  3   Preterm  0   AB  0   Living  3      SAB  0   IAB  0   Ectopic  0   Multiple      Live Births  3           Family History  Problem Relation Age of Onset   Cancer Mother    Hypertension Mother    Diabetes Mother    Breast cancer Maternal Aunt    Breast cancer Cousin     Social History   Tobacco Use   Smoking status: Never   Smokeless tobacco: Never  Vaping Use   Vaping Use: Never used  Substance Use Topics   Alcohol use: Not Currently    Comment: occ   Drug use: No  Home Medications Prior to Admission medications   Medication Sig Start Date End Date Taking? Authorizing Provider  Accu-Chek Softclix Lancets lancets Use to check blood sugar twice daily. 04/27/21   Elayne Snare, MD  amLODipine (NORVASC) 10 MG tablet TAKE 1 TABLET EVERY DAY ONCE A DAY ORALLY 90 Patient taking differently: Take 10 mg by mouth daily. 03/13/18   Elayne Snare, MD  amoxicillin (AMOXIL) 500 MG capsule SMARTSIG:2 Capsule(s) By Mouth Every 12 Hours 04/21/21   [provider]  benzonatate (TESSALON) 200 MG capsule  04/01/21   [provider]  carvedilol (COREG) 25 MG tablet Take by mouth. 04/17/21 07/16/21  [provider]  clotrimazole (LOTRIMIN) 1 % cream Apply 1 application topically in the morning and at bedtime. Patient not taking: Reported on 04/27/2021 10/29/20   [provider]  Continuous Blood Gluc Sensor (DEXCOM G6 SENSOR) MISC Use  to monitor blood sugar, change after 10 days 03/30/21   Elayne Snare, MD  cyclobenzaprine (FLEXERIL) 10 MG tablet Take 10 mg by mouth every 6 (six) hours as needed for muscle spasms. 08/27/20   [provider]  dapagliflozin propanediol (FARXIGA) 5 MG TABS tablet Take by mouth. 10/01/20   [provider]  escitalopram (LEXAPRO) 10 MG tablet Take 10 mg by mouth at bedtime. 04/06/21   [provider]  glucose blood (ACCU-CHEK GUIDE) test strip Use as instructed to check blood sugar twice daily. 04/27/21   Elayne Snare, MD  Insulin Syringe-Needle U-100 30G X 1/2" 0.3 ML MISC Use twice a day with insulin 10/17/17   Elayne Snare, MD  loratadine (CLARITIN) 10 MG tablet Take 10 mg by mouth daily as needed for allergies. Patient not taking: Reported on 04/27/2021    [provider]  losartan (COZAAR) 100 MG tablet Take 100 mg by mouth daily. 04/17/21   [provider]  losartan-hydrochlorothiazide (HYZAAR) 100-25 MG per tablet Take 1 tablet by mouth every morning.  Patient not taking: Reported on 04/27/2021    [provider]  metFORMIN (GLUCOPHAGE) 1000 MG tablet Take 1,000 mg by mouth 2 (two) times daily. 03/18/20   [provider]  metoprolol succinate (TOPROL-XL) 100 MG 24 hr tablet Take 100 mg by mouth daily.  Patient not taking: Reported on 04/27/2021 09/14/17   [provider]  Multiple Vitamins-Calcium (ONE-A-DAY WOMENS PO) Take 1 tablet by mouth daily.    [provider]  naproxen (NAPROSYN) 500 MG tablet Take 500 mg by mouth 2 (two) times daily as needed for moderate pain. Patient not taking: Reported on 04/27/2021 08/19/20   [provider]  NOVOLIN 70/30 FLEXPEN (70-30) 100 UNIT/ML KwikPen Inject 30 Units into the skin in the morning and at bedtime. 03/18/20   [provider]  ondansetron (ZOFRAN) 4 MG tablet Take 4 mg by mouth every 6 (six) hours as needed for vomiting or nausea. 08/19/20   [provider]  oxyCODONE-acetaminophen (PERCOCET/ROXICET) 5-325 MG tablet Take 1 tablet by mouth every 6 (six) hours as needed for up to 5 doses for severe pain. 04/25/21   Luna Fuse, MD  potassium chloride SA (KLOR-CON M20) 20 MEQ tablet Take 1 tablet (20 mEq total) by mouth daily. Patient not taking: Reported on 04/27/2021 03/30/21   Elayne Snare, MD  rosuvastatin (CRESTOR) 20 MG tablet Take 20 mg by mouth at bedtime. Patient not taking: Reported on 04/27/2021 10/20/20   [provider]  spironolactone (ALDACTONE) 25 MG tablet Take 1 tablet by mouth daily. 04/17/21 07/16/21  [provider]  tiZANidine (ZANAFLEX) 2 MG tablet Take 2 mg by mouth every 12 (twelve) hours as needed for muscle pain. 02/22/20   [provider]    Allergies    Canagliflozin and Hydrocodone  Review of Systems   Review of Systems  Constitutional:  Positive for fatigue. Negative for chills and fever.  HENT:  Negative for ear pain and sore throat.   Eyes:  Positive for pain and visual disturbance.  Respiratory:  Negative for cough and shortness of breath.   Cardiovascular:  Negative for chest pain and palpitations.  Gastrointestinal:  Negative for abdominal pain and vomiting.  Genitourinary:  Negative for dysuria and hematuria.  Musculoskeletal:  Negative for arthralgias and back pain.  Skin:  Negative for color change and rash.  Neurological:  Positive for headaches. Negative for seizures and syncope.  All other systems reviewed and are negative.  Physical Exam Updated Vital Signs BP (!) 186/77 (BP Location: Left Arm)   Pulse 64   Temp 97.8 F (36.6 C) (Oral)   Resp 20   Ht 5\' 6"  (1.676 m)   Wt 106.1 kg   LMP 08/02/2013 Comment: spotting  SpO2 100%   BMI 37.77 kg/m   Physical Exam Vitals and nursing note reviewed.  Constitutional:      General: She is not in acute distress.    Appearance: She is well-developed.  HENT:     Head: Normocephalic and atraumatic.  Eyes:      Conjunctiva/sclera: Conjunctivae normal.     Comments: Right eye - pupil is equal round and reactive, normal EOM, normal visual fields Left eye - pupil is still somewhat dilated, normal eom, deferred visual field testing  Cardiovascular:     Rate and Rhythm: Normal rate and regular rhythm.     Heart sounds: No murmur heard. Pulmonary:     Effort: Pulmonary effort is normal. No respiratory distress.     Breath sounds: Normal breath sounds.  Abdominal:     Palpations: Abdomen is soft.     Tenderness: There is no abdominal tenderness.  Musculoskeletal:        General: No swelling.     Cervical back: Neck supple.  Skin:    General: Skin is warm and dry.     Capillary Refill: Capillary refill takes less than 2 seconds.  Neurological:     Mental Status: She is alert.     Comments: AAOx3 CN 2-12 intact, speech clear visual fields intact (tested right eye only) 5/5 strength in b/l UE and LE Sensation to light touch intact in b/l UE and LE Normal FNF Normal gait  Psychiatric:        Mood and Affect: Mood normal.    ED Results / Procedures / Treatments   Labs (all labs ordered are listed, but only abnormal results are displayed) Labs Reviewed  CBC WITH DIFFERENTIAL/PLATELET - Abnormal; Notable for the following components:      Result Value   Hemoglobin 11.4 (*)    HCT 35.7 (*)    All other components within normal limits  BASIC METABOLIC PANEL - Abnormal; Notable for the following components:   Glucose, Bld 144 (*)    All other components within normal limits  GLUCOSE, CSF - Abnormal; Notable for the following components:   Glucose, CSF 92 (*)    All other components within normal limits  CSF CULTURE W GRAM STAIN  PROTEIN, CSF  CSF CELL COUNT WITH DIFFERENTIAL  CSF CELL COUNT WITH DIFFERENTIAL  EKG None  Radiology CT Angio Head W or Wo Contrast  Result Date: 06/05/2021 CLINICAL DATA:  Neuro deficit, acute, stroke suspected. Additional history provided: Severe  headache, left-sided, concern for aneurysm, stroke, subarachnoid hemorrhage. EXAM: CT ANGIOGRAPHY HEAD TECHNIQUE: Multidetector CT imaging of the head was performed using the standard protocol during bolus administration of intravenous contrast. Multiplanar CT image reconstructions and MIPs were obtained to evaluate the vascular anatomy. CONTRAST:  44mL OMNIPAQUE IOHEXOL 350 MG/ML SOLN COMPARISON:  Head CT 03/23/2020. FINDINGS: CT HEAD Brain: Cerebral volume is normal for age. Patchy and ill-defined hypoattenuation within the cerebral white matter, nonspecific but most often secondary to chronic small vessel ischemia. There is no acute intracranial hemorrhage. No demarcated cortical infarct. No extra-axial fluid collection. No evidence of an intracranial mass. No midline shift. Vascular: No hyperdense vessel. Skull: Normal. Negative for fracture or focal lesion. Orbits: No acute or significant orbital finding. Sinuses: No significant paranasal sinus disease at the imaged levels. CTA HEAD Anterior circulation: The intracranial internal carotid arteries are patent. The M1 middle cerebral arteries are patent. No M2 proximal branch occlusion or high-grade proximal stenosis is identified. The anterior cerebral arteries are patent. The right A1 segment is hypoplastic. 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 Posterior circulation: The intracranial vertebral arteries are patent. The basilar artery is patent. The posterior cerebral arteries are patent. Posterior communicating arteries are present bilaterally. Venous sinuses: Within the limitations of contrast timing, no convincing thrombus. Anatomic variants: As described. Review of the MIP images confirms the above findings.  IMPRESSION: CT head: 1. No evidence of acute intracranial hemorrhage or acute infarction. 2. Patchy and ill-defined hypoattenuation within the cerebral white matter, nonspecific but most often secondary to chronic small vessel ischemia. CTA head: 1. 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 2. 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 3. No intracranial large vessel occlusion or proximal high-grade arterial stenosis. Electronically Signed   By: Kellie Simmering D.O.   On: 06/05/2021 09:23    Procedures .Lumbar Puncture  Date/Time: 06/05/2021 4:11 PM Performed by: Lucrezia Starch, MD Authorized by: Lucrezia Starch, MD   Consent:    Consent obtained:  Verbal   Consent given by:  Patient   Risks, benefits, and alternatives were discussed: yes     Risks discussed:  Bleeding, headache, nerve damage, infection, pain and repeat procedure   Alternatives discussed:  No treatment, delayed treatment, alternative treatment, observation and referral Universal protocol:    Site/side marked: yes     Patient identity confirmed:  Verbally with patient Pre-procedure details:    Procedure purpose:  Diagnostic   Preparation: Patient was prepped and draped in usual sterile fashion   Anesthesia:    Anesthesia method:  Local infiltration   Local anesthetic:  Lidocaine 1% w/o epi Procedure details:    Lumbar space:  L3-L4 interspace   Patient position:  Sitting   Needle gauge:  20   Needle length (in):  2.5   Ultrasound guidance: no     Number of attempts:  1   Fluid appearance:  Clear   Tubes of fluid:  4   Total volume (ml):  5 Post-procedure details:    Puncture site:  Adhesive bandage applied   Procedure completion:  Tolerated well, no immediate complications   Medications Ordered in ED Medications  metoCLOPramide (REGLAN) injection 10 mg (10 mg Intravenous  Given 06/05/21 0728)  diphenhydrAMINE (BENADRYL) injection 25 mg (25 mg Intravenous Given 06/05/21 0722)  iohexol (OMNIPAQUE) 350 MG/ML injection 100 mL (75 mLs Intravenous Contrast Given 06/05/21 0838)  acetaminophen (TYLENOL) tablet 650 mg (650 mg Oral Given 06/05/21 1040)  lidocaine (PF) (XYLOCAINE) 1 % injection 5 mL (5 mLs Infiltration Given 06/05/21 1041)  tetracaine (PONTOCAINE) 0.5 % ophthalmic solution 1 drop (1 drop Left Eye Given 06/05/21 1040)    ED Course  I have reviewed the triage vital signs and the nursing notes.  Pertinent labs & imaging results that were available during my care of the patient were reviewed by me and considered in my medical decision making (see chart for details).    MDM Rules/Calculators/A&P                           60 year old lady presents to ER with concern for severe headache.  Headache ongoing for the past day or so, left-sided.  Notably had cataract surgery for her left eye yesterday afternoon but reported headache preceded this.  On exam she appears well in no distress and has no neurologic deficit.  Discussed with Dr. Katy Fitch her ophthalmologist -recommended checking IOP in both eyes, if abnormal could explain symptoms but if normal recommended looking for other causes.  Given the severity of her reported headache, did check CT head and CTA.  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  Discussed these findings with Dr. Karenann Cai, neuro IR specialist who recommended LP if there was significant clinical concern for [facility].  If LP negative, she will follow-up with patient in the outpatient setting for monitoring/potential treatment of these small aneurysms.  She felt they less likely to cause SAH but could not entirely exclude small sentinel SAH without LP.  I recommended to patient proceeding with LP given severity of her headache.  Discussed risk and benefits and pros and cons of LP  versus observation with patient.  Patient agreeable to proceed with LP.  LP successful on 1 attempt, patient tolerated well.  CSF was clear.  Sent to lab to confirm.  While awaiting CSF results, signed out to Dr. Armandina Gemma who will f/u.     Final Clinical Impression(s) / ED Diagnoses Final diagnoses:  Severe headache  Aneurysm ([place])    Rx / DC Orders ED Discharge Orders     None        Lucrezia Starch, MD 06/05/21 [phone]

## 2021-06-05 NOTE — ED Notes (Signed)
Pt lying flat. Alert, NAD, calm, interactive. Up to b/r with steady gait with cane.

## 2021-06-05 NOTE — ED Notes (Signed)
Went out to get the pt for triage and she stated she is just [place] try [facility] because she can tell the wait is long

## 2021-06-05 NOTE — ED Notes (Signed)
Back to stretcher w/o incident. HA improved, intermittent 7/10. Denies nausea, dizziness or other sx. Prefers not to lie flat. Husband at [facility].

## 2021-06-05 NOTE — ED Notes (Signed)
Pt to CT

## 2021-06-05 NOTE — ED Triage Notes (Signed)
Pt presents to the ED with a throbbing pain in the posterior left side of her head that started yesterday around 10pm. States that she did have a cataract removed from her left eye yesterday, but states that she had this pain prior to the surgery, it just came back last night. Pt reports taking pt took 1,000mg  tylenol at 4am. Pt A&Ox4 at time of triage. VSS.

## 2021-06-08 ENCOUNTER — Other Ambulatory Visit: Payer: Self-pay

## 2021-06-08 ENCOUNTER — Emergency Department (HOSPITAL_BASED_OUTPATIENT_CLINIC_OR_DEPARTMENT_OTHER)
Admission: EM | Admit: 2021-06-08 | Discharge: 2021-06-08 | Disposition: A | Discharge status: Home / Self Care | Payer: BC Managed Care – PPO | Source: Home / Self Care | Attending: Emergency Medicine | Admitting: Emergency Medicine

## 2021-06-08 ENCOUNTER — Emergency Department (HOSPITAL_COMMUNITY)
Admission: EM | Admit: 2021-06-08 | Discharge: 2021-06-08 | Disposition: A | Discharge status: Home / Self Care | Payer: BC Managed Care – PPO | Attending: Emergency Medicine | Admitting: Emergency Medicine

## 2021-06-08 ENCOUNTER — Emergency Department (HOSPITAL_COMMUNITY): Payer: BC Managed Care – PPO

## 2021-06-08 ENCOUNTER — Encounter (HOSPITAL_BASED_OUTPATIENT_CLINIC_OR_DEPARTMENT_OTHER): Payer: Self-pay | Admitting: Emergency Medicine

## 2021-06-08 DIAGNOSIS — Z794 Long term (current) use of insulin: Secondary | ICD-10-CM | POA: Diagnosis not present

## 2021-06-08 DIAGNOSIS — R519 Headache, unspecified: Secondary | ICD-10-CM | POA: Insufficient documentation

## 2021-06-08 DIAGNOSIS — E119 Type 2 diabetes mellitus without complications: Secondary | ICD-10-CM | POA: Insufficient documentation

## 2021-06-08 DIAGNOSIS — Z79899 Other long term (current) drug therapy: Secondary | ICD-10-CM | POA: Insufficient documentation

## 2021-06-08 DIAGNOSIS — Z5321 Procedure and treatment not carried out due to patient leaving prior to being seen by health care provider: Secondary | ICD-10-CM | POA: Insufficient documentation

## 2021-06-08 DIAGNOSIS — M542 Cervicalgia: Secondary | ICD-10-CM | POA: Diagnosis not present

## 2021-06-08 DIAGNOSIS — I10 Essential (primary) hypertension: Secondary | ICD-10-CM | POA: Insufficient documentation

## 2021-06-08 DIAGNOSIS — Z7984 Long term (current) use of oral hypoglycemic drugs: Secondary | ICD-10-CM | POA: Insufficient documentation

## 2021-06-08 LAB — COMPREHENSIVE METABOLIC PANEL
ALT: 19 U/L (ref 0–44)
AST: 19 U/L (ref 15–41)
Albumin: 3.9 g/dL (ref 3.5–5.0)
Alkaline Phosphatase: 49 U/L (ref 38–126)
Anion gap: 7 (ref 5–15)
BUN: 19 mg/dL (ref 6–20)
CO2: 25 mmol/L (ref 22–32)
Calcium: 9.3 mg/dL (ref 8.9–10.3)
Chloride: 105 mmol/L (ref 98–111)
Creatinine, Ser: 0.82 mg/dL (ref 0.44–1.00)
GFR, Estimated: >60 mL/min (ref >60)
Glucose, Bld: 160 mg/dL — ABNORMAL HIGH (ref 70–99)
Potassium: 3.8 mmol/L (ref 3.5–5.1)
Sodium: 137 mmol/L (ref 135–145)
Total Bilirubin: 0.4 mg/dL (ref 0.3–1.2)
Total Protein: 7.6 g/dL (ref 6.5–8.1)

## 2021-06-08 LAB — CBC WITH DIFFERENTIAL/PLATELET
Abs Immature Granulocytes: 0.01 10*3/uL (ref 0.00–0.07)
Basophils Absolute: 0 10*3/uL (ref 0.0–0.1)
Basophils Relative: 1 %
Eosinophils Absolute: 0.1 10*3/uL (ref 0.0–0.5)
Eosinophils Relative: 2 %
HCT: 40.7 % (ref 36.0–46.0)
Hemoglobin: 12.7 g/dL (ref 12.0–15.0)
Immature Granulocytes: 0 %
Lymphocytes Relative: 30 %
Lymphs Abs: 1.6 10*3/uL (ref 0.7–4.0)
MCH: 28.9 pg (ref 26.0–34.0)
MCHC: 31.2 g/dL (ref 30.0–36.0)
MCV: 92.7 fL (ref 80.0–100.0)
Monocytes Absolute: 0.6 10*3/uL (ref 0.1–1.0)
Monocytes Relative: 11 %
Neutro Abs: 3.1 10*3/uL (ref 1.7–7.7)
Neutrophils Relative %: 56 %
Platelets: 231 10*3/uL (ref 150–400)
RBC: 4.39 MIL/uL (ref 3.87–5.11)
RDW: 14.3 % (ref 11.5–15.5)
WBC: 5.5 10*3/uL (ref 4.0–10.5)
nRBC: 0 % (ref 0.0–0.2)

## 2021-06-08 LAB — CSF CULTURE W GRAM STAIN: Culture: NO GROWTH

## 2021-06-08 IMAGING — CT CT HEAD W/O CM
4 series · 16 of 47 positions shown, 18 images · non-contrast
Comparison: [DATE]

CLINICAL DATA: Headache, intracranial hemorrhage suspected. Sudden
worsening. History of cerebral aneurysms.

EXAM:
CT HEAD WITHOUT CONTRAST
TECHNIQUE: Contiguous axial images were obtained from the base of the skull
through the vertex without intravenous contrast.

[Series 3: head wo · axial · 0.45mm/px · z∈[-182,-62]mm · 7 of 32 slices shown, 9 images]
[im 4/32  brain]
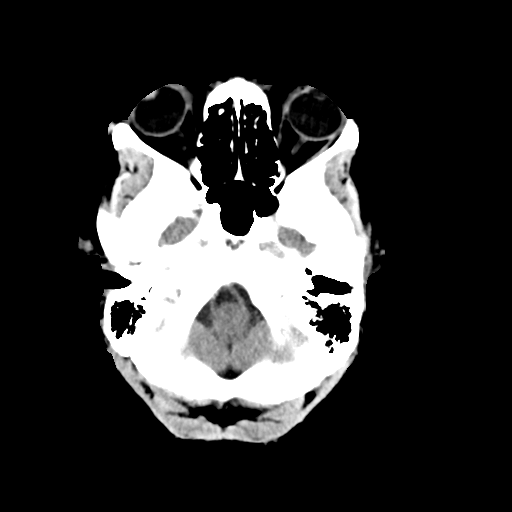
[im 4/32  bone]
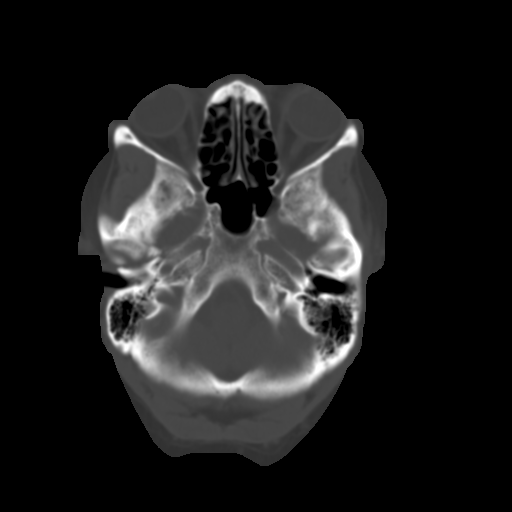
[im 8/32  brain]
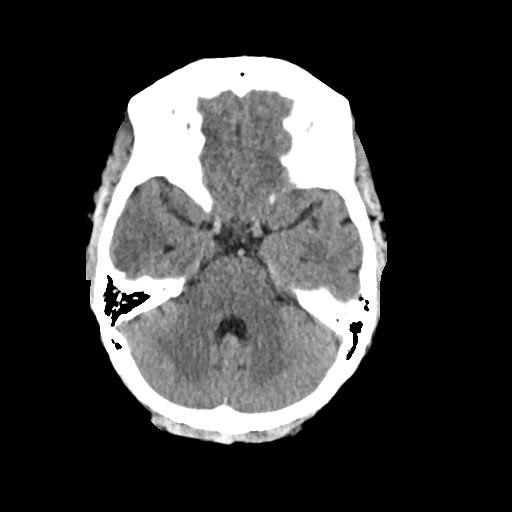
[im 12/32  brain]
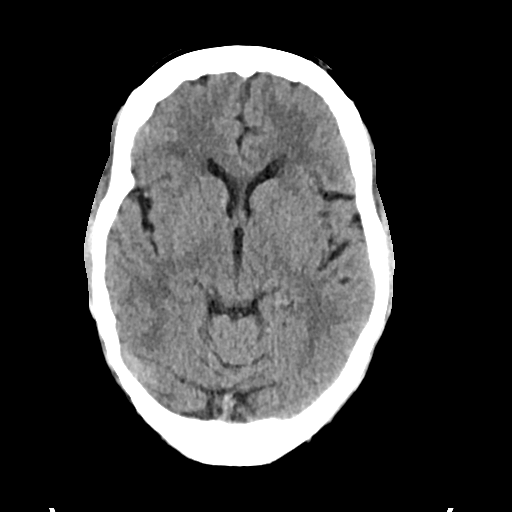
[im 16/32  brain]
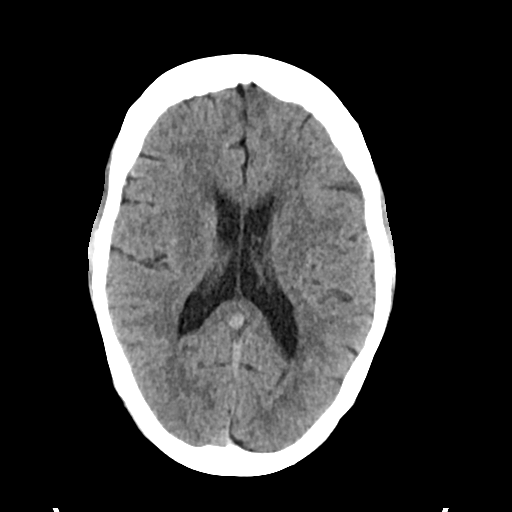
[im 20/32  brain]
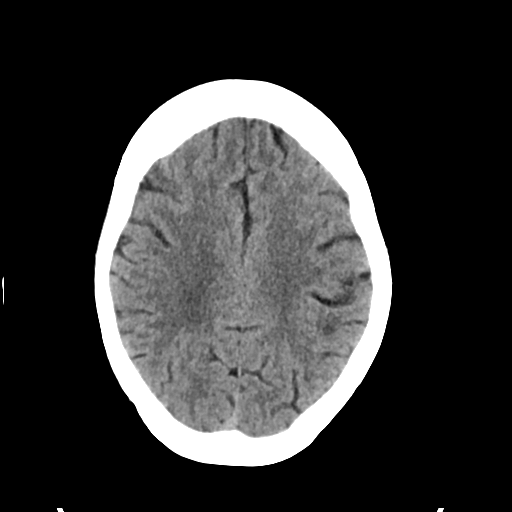
[im 20/32  bone]
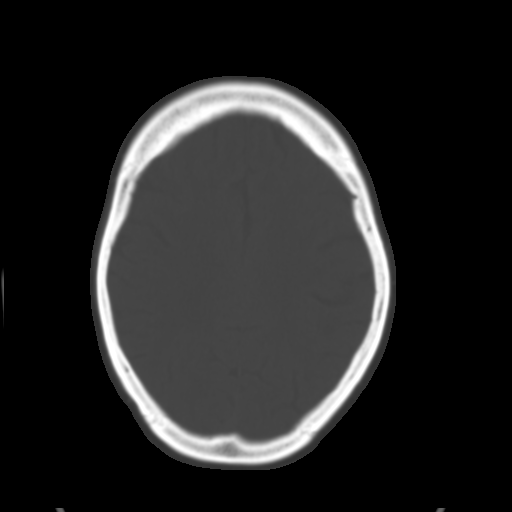
[im 24/32  brain]
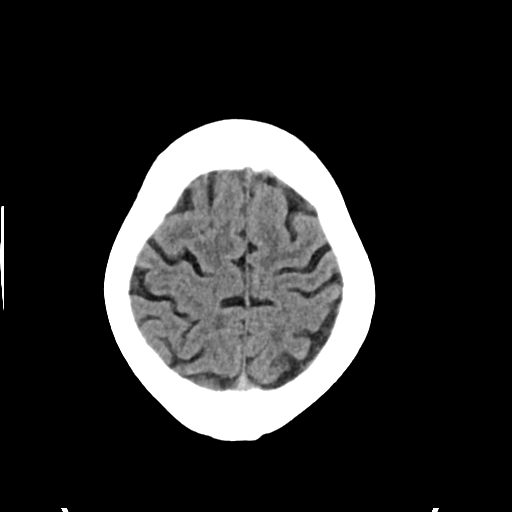
[im 28/32  brain]
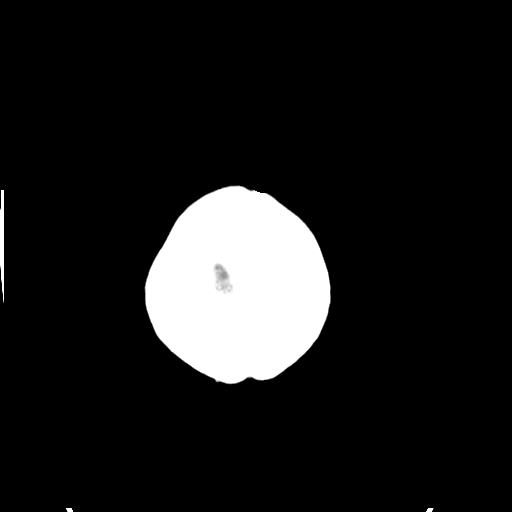

[Series 4: head bone · axial · 0.45mm/px · z∈[-184,-152]mm · 3 of 79 slices shown]
[im 8/79  bone]
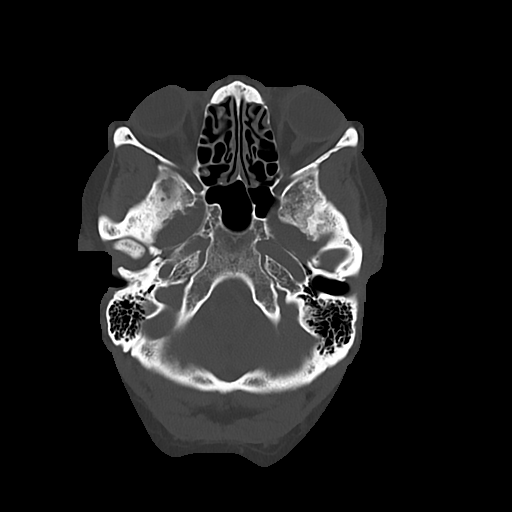
[im 16/79  bone]
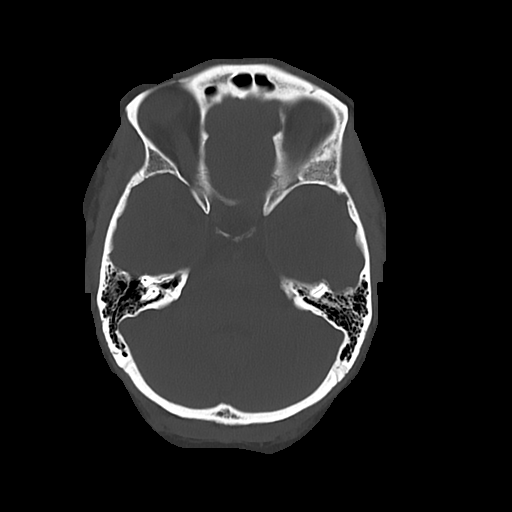
[im 24/79  bone]
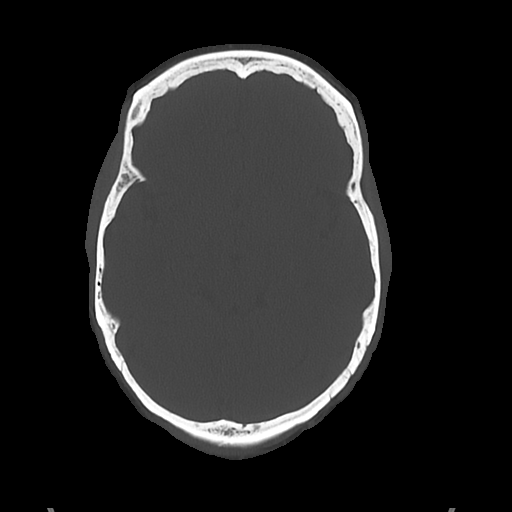

[Series 5: cor soft · coronal · 0.31mm/px · 3 of 69 slices shown]
[im 23/69  brain]
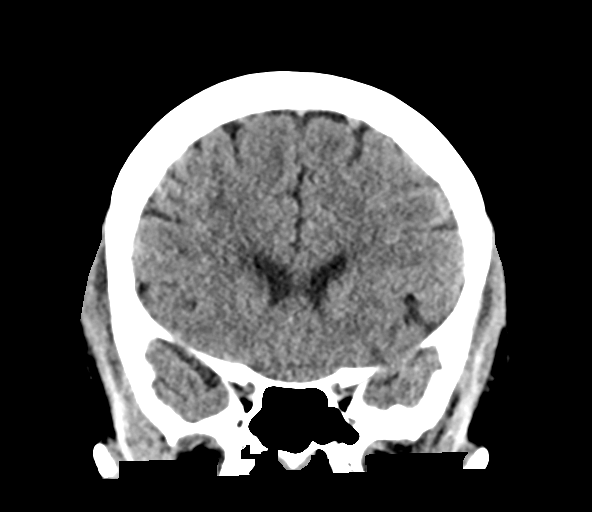
[im 31/69  brain]
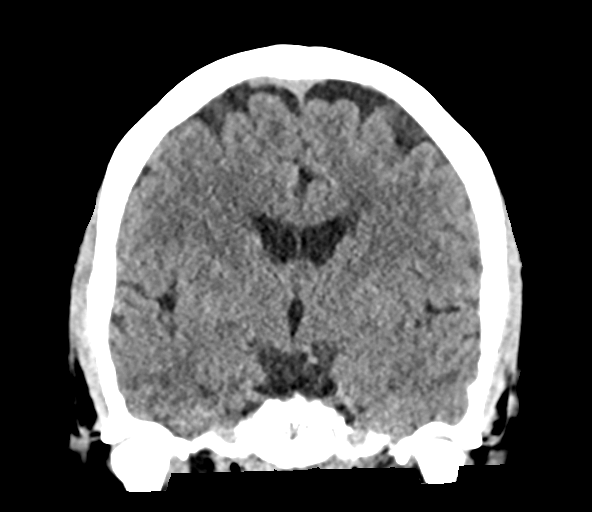
[im 38/69  brain]
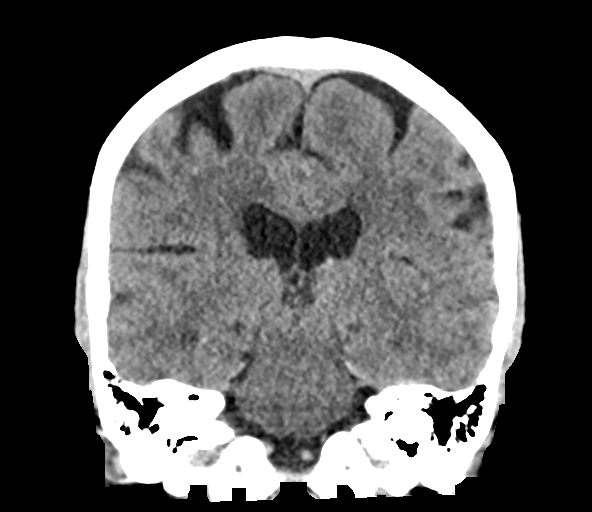

[Series 6: sag soft · sagittal · 0.33mm/px · 3 of 54 slices shown]
[im 18/54  brain]
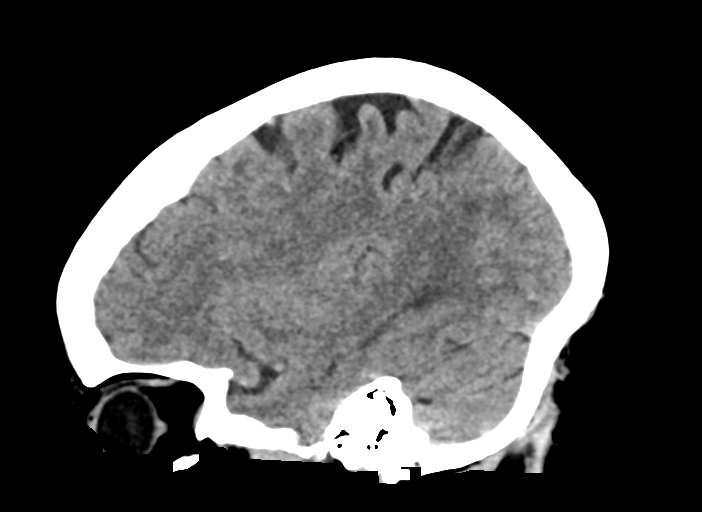
[im 27/54  brain]
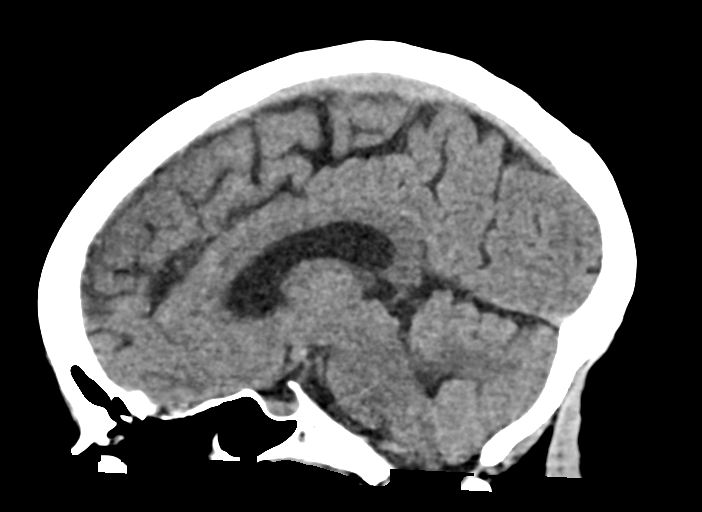
[im 36/54  brain]
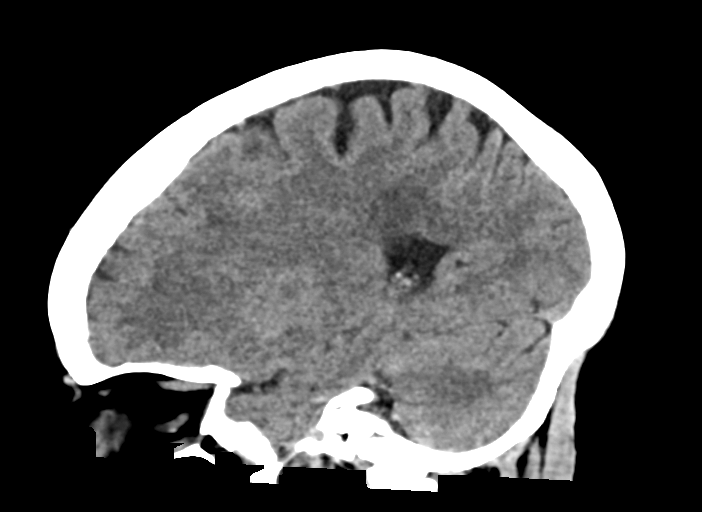

[16 of 47 positions shown; findings below may reference images not displayed]

FINDINGS: Brain: No sign of acute infarction. There chronic small-vessel
ischemic changes of the cerebral hemispheric white matter. No mass
lesion, hemorrhage, hydrocephalus or extra-axial collection.

Vascular: No abnormal vascular finding by CT.

Skull: Normal

Sinuses/Orbits: Clear/normal

Other: None
IMPRESSION: No acute CT finding. Chronic small-vessel ischemic changes of the
cerebral hemispheric white matter. No visible intracranial
hemorrhage.

## 2021-06-08 MED ORDER — MAGNESIUM SULFATE 2 GM/50ML IV SOLN
2.0000 g | Freq: Once | INTRAVENOUS | Status: AC
  Administered 2021-06-08: 2 g via INTRAVENOUS
  Filled 2021-06-08: qty 50

## 2021-06-08 MED ORDER — CARVEDILOL 12.5 MG PO TABS
25.0000 mg | ORAL_TABLET | Freq: Once | ORAL | Status: AC
  Administered 2021-06-08: 25 mg via ORAL
  Filled 2021-06-08: qty 2

## 2021-06-08 MED ORDER — LACTATED RINGERS IV BOLUS
500.0000 mL | Freq: Once | INTRAVENOUS | Status: AC
  Administered 2021-06-08: 500 mL via INTRAVENOUS

## 2021-06-08 MED ORDER — LOSARTAN POTASSIUM 25 MG PO TABS
100.0000 mg | ORAL_TABLET | Freq: Every day | ORAL | Status: DC
  Administered 2021-06-08: 100 mg via ORAL
  Filled 2021-06-08: qty 4

## 2021-06-08 MED ORDER — AMLODIPINE BESYLATE 5 MG PO TABS: 10.0000 mg | ORAL_TABLET | Freq: Every day | ORAL | Status: DC

## 2021-06-08 MED ORDER — KETOROLAC TROMETHAMINE 15 MG/ML IJ SOLN
15.0000 mg | Freq: Once | INTRAMUSCULAR | Status: AC
  Administered 2021-06-08: 15 mg via INTRAVENOUS
  Filled 2021-06-08: qty 1

## 2021-06-08 MED ORDER — OXYCODONE-ACETAMINOPHEN 5-325 MG PO TABS
1.0000 | ORAL_TABLET | Freq: Once | ORAL | Status: AC
  Administered 2021-06-08: 1 via ORAL
  Filled 2021-06-08: qty 1

## 2021-06-08 MED ORDER — DIPHENHYDRAMINE HCL 50 MG/ML IJ SOLN
25.0000 mg | Freq: Once | INTRAMUSCULAR | Status: AC
  Administered 2021-06-08: 25 mg via INTRAVENOUS
  Filled 2021-06-08: qty 1

## 2021-06-08 MED ORDER — PROCHLORPERAZINE EDISYLATE 10 MG/2ML IJ SOLN
10.0000 mg | Freq: Once | INTRAMUSCULAR | Status: AC
  Administered 2021-06-08: 10 mg via INTRAVENOUS
  Filled 2021-06-08: qty 2

## 2021-06-08 NOTE — ED Triage Notes (Signed)
Pt arrives to ED with c/o posterior headache that started today. The headache is described as severe and described as sharp. Pt reports brain aneurysm that was found on CTA on 06/05/21. Pt was originally in [facility] ED waiting room and left. Pt had negative CT head this morning. No N/V. No vision abnormalities. 10/10 pain.

## 2021-06-08 NOTE — ED Provider Notes (Signed)
[facility] EMERGENCY DEPT Provider Note   CSN: [id number] Arrival date & time: 06/08/21  1003     History Chief Complaint  Patient presents with   Headache    Shannon Oliver is a 60 y.o. female.   Headache Associated symptoms: neck pain   Associated symptoms: no abdominal pain, no back pain, no congestion, no cough, no diarrhea, no dizziness, no ear pain, no eye pain, no fatigue, no fever, no nausea, no numbness, no seizures, no sore throat, no vomiting and no weakness   Patient presents for headache.  She has had a headache over the past week.  She was seen in the emergency department and underwent CT studies and lumbar puncture 3 days ago.  CT scans at that time showed cerebral aneurysms.  LP results were negative.  Over the weekend, she reports that her headache improved, although never went away.  Headache pain worsened again this morning.  Prior to arrival, she was triaged in the waiting room at [facility].  She did undergo a CT scan of her head.  Results showed no acute findings.  She has not taken her morning medications, including multiple antihypertensive medicines.  She describes her headache as posterior.  She also recently underwent cataract surgery.  This was around the time of headache onset.  She denies any acute changes to her vision.  She denies any current nausea, weakness, or numbness.    Past Medical History:  Diagnosis Date   Abnormal EKG    LVH with strain   Acid reflux    Depression    Diabetes mellitus    A1C over 9   HLD (hyperlipidemia)    Hypertension    Noncompliance    Obesity     Patient Active Problem List   Diagnosis Date Noted   Hyperlipidemia 04/30/2021   BMI 39.0-39.9,adult 04/17/2021   Elevated troponin 10/30/2020   AKI (acute kidney injury) ([place]) 10/30/2020   Palpitations    Chest pain 10/29/2020   Encounter for orthopedic follow-up care 08/19/2020   Pain in joint of left shoulder 05/14/2020   Hypertensive left  ventricular hypertrophy, without heart failure 12/18/2018   Obesity due to excess calories without serious comorbidity 12/18/2018   Type 2 diabetes mellitus without complication, without long-term current use of insulin ([place]) 12/15/2018   Postmenopausal bleeding 06/05/2017   Neck pain 03/01/2014   Edema 01/25/2013   DM (diabetes mellitus) ([place]) 01/25/2013   Dyspnea 01/25/2013   Abnormal ECG 01/25/2013   Hypertension    Acid reflux     Past Surgical History:  Procedure Laterality Date   CATARACT EXTRACTION Left 06/04/2021   COLONOSCOPY WITH PROPOFOL N/A 04/29/2015   Procedure: COLONOSCOPY WITH PROPOFOL;  Surgeon: Garlan Fair, MD;  Location: [facility];  Service: Endoscopy;  Laterality: N/A;   HYSTEROSCOPY WITH D & C N/A 09/07/2017   Procedure: DILATATION AND CURETTAGE /HYSTEROSCOPY;  Surgeon: Sloan Leiter, MD;  Location: [place];  Service: Gynecology;  Laterality: N/A;   KNEE ARTHROSCOPY W/ MENISCAL REPAIR Right      OB History     Gravida  3   Para  3   Term  3   Preterm  0   AB  0   Living  3      SAB  0   IAB  0   Ectopic  0   Multiple      Live Births  3  Family History  Problem Relation Age of Onset   Cancer Mother    Hypertension Mother    Diabetes Mother    Breast cancer Maternal Aunt    Breast cancer Cousin     Social History   Tobacco Use   Smoking status: Never   Smokeless tobacco: Never  Vaping Use   Vaping Use: Never used  Substance Use Topics   Alcohol use: Not Currently    Comment: occ   Drug use: No    Home Medications Prior to Admission medications   Medication Sig Start Date End Date Taking? Authorizing Provider  Accu-Chek Softclix Lancets lancets Use to check blood sugar twice daily. 04/27/21   Elayne Snare, MD  amLODipine (NORVASC) 10 MG tablet TAKE 1 TABLET EVERY DAY ONCE A DAY ORALLY 90 Patient taking differently: Take 10 mg by mouth daily. 03/13/18   Elayne Snare, MD  amoxicillin  (AMOXIL) 500 MG capsule SMARTSIG:2 Capsule(s) By Mouth Every 12 Hours 04/21/21   [provider]  benzonatate (TESSALON) 200 MG capsule  04/01/21   [provider]  carvedilol (COREG) 25 MG tablet Take by mouth. 04/17/21 07/16/21  [provider]  clotrimazole (LOTRIMIN) 1 % cream Apply 1 application topically in the morning and at bedtime. Patient not taking: Reported on 04/27/2021 10/29/20   [provider]  Continuous Blood Gluc Sensor (DEXCOM G6 SENSOR) MISC Use to monitor blood sugar, change after 10 days 03/30/21   Elayne Snare, MD  cyclobenzaprine (FLEXERIL) 10 MG tablet Take 10 mg by mouth every 6 (six) hours as needed for muscle spasms. 08/27/20   [provider]  dapagliflozin propanediol (FARXIGA) 5 MG TABS tablet Take by mouth. 10/01/20   [provider]  escitalopram (LEXAPRO) 10 MG tablet Take 10 mg by mouth at bedtime. 04/06/21   [provider]  glucose blood (ACCU-CHEK GUIDE) test strip Use as instructed to check blood sugar twice daily. 04/27/21   Elayne Snare, MD  Insulin Syringe-Needle U-100 30G X 1/2" 0.3 ML MISC Use twice a day with insulin 10/17/17   Elayne Snare, MD  loratadine (CLARITIN) 10 MG tablet Take 10 mg by mouth daily as needed for allergies. Patient not taking: Reported on 04/27/2021    [provider]  losartan (COZAAR) 100 MG tablet Take 100 mg by mouth daily. 04/17/21   [provider]  losartan-hydrochlorothiazide (HYZAAR) 100-25 MG per tablet Take 1 tablet by mouth every morning.  Patient not taking: Reported on 04/27/2021    [provider]  metFORMIN (GLUCOPHAGE) 1000 MG tablet Take 1,000 mg by mouth 2 (two) times daily. 03/18/20   [provider]  metoprolol succinate (TOPROL-XL) 100 MG 24 hr tablet Take 100 mg by mouth daily.  Patient not taking: Reported on 04/27/2021 09/14/17   [provider]  Multiple Vitamins-Calcium (ONE-A-DAY WOMENS PO) Take 1 tablet by  mouth daily.    [provider]  naproxen (NAPROSYN) 500 MG tablet Take 500 mg by mouth 2 (two) times daily as needed for moderate pain. Patient not taking: Reported on 04/27/2021 08/19/20   [provider]  NOVOLIN 70/30 FLEXPEN (70-30) 100 UNIT/ML KwikPen Inject 30 Units into the skin in the morning and at bedtime. 03/18/20   [provider]  ondansetron (ZOFRAN) 4 MG tablet Take 4 mg by mouth every 6 (six) hours as needed for vomiting or nausea. 08/19/20   [provider]  oxyCODONE-acetaminophen (PERCOCET/ROXICET) 5-325 MG tablet Take 1 tablet by mouth every 6 (six) hours  as needed for up to 5 doses for severe pain. 04/25/21   Luna Fuse, MD  potassium chloride SA (KLOR-CON M20) 20 MEQ tablet Take 1 tablet (20 mEq total) by mouth daily. Patient not taking: Reported on 04/27/2021 03/30/21   Elayne Snare, MD  rosuvastatin (CRESTOR) 20 MG tablet Take 20 mg by mouth at bedtime. Patient not taking: Reported on 04/27/2021 10/20/20   [provider]  spironolactone (ALDACTONE) 25 MG tablet Take 1 tablet by mouth daily. 04/17/21 07/16/21  [provider]  tiZANidine (ZANAFLEX) 2 MG tablet Take 2 mg by mouth every 12 (twelve) hours as needed for muscle pain. 02/22/20   [provider]    Allergies    Canagliflozin and Hydrocodone  Review of Systems   Review of Systems  Constitutional:  Negative for activity change, appetite change, chills, fatigue and fever.  HENT:  Negative for congestion, ear pain and sore throat.   Eyes:  Negative for pain and visual disturbance.  Respiratory:  Negative for cough, chest tightness, shortness of breath and wheezing.   Cardiovascular:  Negative for chest pain and palpitations.  Gastrointestinal:  Negative for abdominal pain, diarrhea, nausea and vomiting.  Genitourinary:  Negative for dysuria, flank pain and hematuria.  Musculoskeletal:  Positive for neck pain. Negative for arthralgias, back pain, gait  problem and joint swelling.  Skin:  Negative for color change and rash.  Neurological:  Positive for headaches. Negative for dizziness, seizures, syncope, facial asymmetry, speech difficulty, weakness, light-headedness and numbness.  Hematological:  Does not bruise/bleed easily.  Psychiatric/Behavioral:  Negative for confusion and decreased concentration.   All other systems reviewed and are negative.  Physical Exam Updated Vital Signs BP (!) 149/81 (BP Location: Left Arm)   Pulse 63   Temp 97.6 F (36.4 C)   Resp 16   LMP 08/02/2013 Comment: spotting  SpO2 100%   Physical Exam Vitals and nursing note reviewed.  Constitutional:      General: She is not in acute distress.    Appearance: She is well-developed. She is not ill-appearing or toxic-appearing.  HENT:     Head: Normocephalic and atraumatic.     Mouth/Throat:     Mouth: Mucous membranes are moist.     Pharynx: Oropharynx is clear.  Eyes:     Extraocular Movements:     Right eye: No nystagmus.     Left eye: No nystagmus.     Conjunctiva/sclera: Conjunctivae normal.  Neck:     Meningeal: Kernig's sign absent.     Comments: Tenderness to neck musculature, greater on the left Cardiovascular:     Rate and Rhythm: Normal rate and regular rhythm.     Heart sounds: No murmur heard. Pulmonary:     Effort: Pulmonary effort is normal. No respiratory distress.     Breath sounds: Normal breath sounds.  Abdominal:     Palpations: Abdomen is soft.     Tenderness: There is no abdominal tenderness.  Musculoskeletal:        General: No swelling.     Cervical back: Normal range of motion and neck supple. No rigidity.  Skin:    General: Skin is warm and dry.     Capillary Refill: Capillary refill takes less than 2 seconds.  Neurological:     Mental Status: She is alert and oriented to person, place, and time.     Cranial Nerves: Cranial nerves 2-12 are intact. No cranial nerve deficit, dysarthria or facial asymmetry.  Sensory: Sensation is intact. No sensory deficit.     Motor: Motor function is intact. No weakness, abnormal muscle tone or pronator drift.     Coordination: Coordination is intact. Coordination normal. Finger-Nose-Finger Test normal.  Psychiatric:        Mood and Affect: Mood normal. Mood is not anxious.        Behavior: Behavior normal.    ED Results / Procedures / Treatments   Labs (all labs ordered are listed, but only abnormal results are displayed) Labs Reviewed - No data to display  EKG None  Radiology CT HEAD WO CONTRAST (5MM)  Result Date: 06/08/2021 CLINICAL DATA:  Headache, intracranial hemorrhage suspected. Sudden worsening. History of cerebral aneurysms. EXAM: CT HEAD WITHOUT CONTRAST TECHNIQUE: Contiguous axial images were obtained from the base of the skull through the vertex without intravenous contrast. COMPARISON:  06/05/2021 FINDINGS: Brain: No sign of acute infarction. There chronic small-vessel ischemic changes of the cerebral hemispheric white matter. No mass lesion, hemorrhage, hydrocephalus or extra-axial collection. Vascular: No abnormal vascular finding by CT. Skull: Normal Sinuses/Orbits: Clear/normal Other: None IMPRESSION: No acute CT finding. Chronic small-vessel ischemic changes of the cerebral hemispheric white matter. No visible intracranial hemorrhage. Electronically Signed   By: Nelson Chimes M.D.   On: 06/08/2021 09:12    Procedures Procedures   Medications Ordered in ED Medications  carvedilol (COREG) tablet 25 mg (25 mg Oral Given 06/08/21 1221)  lactated ringers bolus 500 mL (0 mLs Intravenous Stopped 06/08/21 1315)  prochlorperazine (COMPAZINE) injection 10 mg (10 mg Intravenous Given 06/08/21 1229)  diphenhydrAMINE (BENADRYL) injection 25 mg (25 mg Intravenous Given 06/08/21 1227)  ketorolac (TORADOL) 15 MG/ML injection 15 mg (15 mg Intravenous Given 06/08/21 1228)  magnesium sulfate IVPB 2 g 50 mL (2 g Intravenous New Bag/Given 06/08/21 1235)    ED  Course  I have reviewed the triage vital signs and the nursing notes.  Pertinent labs & imaging results that were available during my care of the patient were reviewed by me and considered in my medical decision making (see chart for details).    MDM Rules/Calculators/A&P                          Patient is a 60 year old female who presents for worsening of her headache pain this morning.  This is the same headache pain that she has had over the past week.  3 days ago, she was seen in the ED for similar pain.  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  The worsening of her headache pain was this morning at around 7 AM.  She did undergo a CT scan of her head prior to arrival at 9 AM.  Results of this study were negative for acute findings, including ICH.  On arrival to the ED, vital signs are notable for hypertension.  On physical exam, she has no focal neurologic deficits.  She does have tenderness throughout the musculature of her neck, more so on the left.  She also has some occipital tenderness, also greater on the left.  She has not taken her morning blood pressure medicines.  These medications were provided in addition to a headache cocktail.  On reassessment, patient's blood pressures had improved.  She also reports significant improvement in her headache pain.  She did feel comfortable with discharge home.  She had was concerned that she was advised to follow-up with neurology regarding her aneurysms and her headaches.  When she tried to call the number for follow-up, she was unable to speak with anyone.  She was given a number for [facility] neurology office.  Ambulatory referral was ordered as well.  Patient was encouraged to return to the ED at any time.  She was discharged in stable condition.  Final Clinical Impression(s) / ED Diagnoses Final diagnoses:  Nonintractable headache, unspecified  chronicity pattern, unspecified headache type    Rx / DC Orders ED Discharge Orders          Ordered    Ambulatory referral to Neurology       Comments: An appointment is requested in approximately: 1 week   06/08/21 1520             Godfrey Pick, MD 06/09/21 [phone]

## 2021-06-08 NOTE — ED Triage Notes (Signed)
Pt. Stated, I was dx with an annuerysm 5 days ago and said if my head started hurting again to come back here.

## 2021-06-08 NOTE — ED Provider Notes (Signed)
Emergency Medicine Provider Triage Evaluation Note  Shannon Oliver , a 60 y.o. female  was evaluated in triage.  Pt complains of worsening of headache.  Has had headache for over a week, was seen here in the emergency department with lumbar puncture, CT head and CT angiograms which diagnosed cerebral aneurysm.  States she was told to come back to the ER if her headache worsened.  She states the headache never completely went away after her visit last time but improved and then suddenly worsened again this morning.  Patient with recent cataract surgery as well, has had blurry vision since that time worse in the right eye than the left but not suddenly worsening today.  Review of Systems  Positive: Headache suddenly worsening, blurry Negative: Fevers, chills, weakness, changes in sensitivity to sensation  Physical Exam  BP (!) 164/102 (BP Location: Right Arm)   Pulse 77   Temp 98.6 F (37 C) (Oral)   Resp 18   LMP 08/02/2013 Comment: spotting  SpO2 95%  Gen:   Awake, visibly uncomfortable, mentating normally. Resp:  Normal effort  MSK:   Moves extremities without difficulty  Other:  Symmetric strength and sensation and grip in knee flexion and extension.  Symmetric facial expressions, patient unwilling to participate and pupillary exam or extraocular exam due to severe photosensitivity.  ANO x3  Medical Decision Making  Medically screening exam initiated at 8:25 AM.  Appropriate orders placed.  Shannon Oliver was informed that the remainder of the evaluation will be completed by another provider, this initial triage assessment does not replace that evaluation, and the importance of remaining in the ED until their evaluation is complete.  CT imaging ordered for concern for possible intracranial hemorrhage.  This chart was dictated using voice recognition software, Dragon. Despite the best efforts of this provider to proofread and correct errors, errors may still occur which can change  documentation meaning.    Emeline Darling, PA-C 06/08/21 0827    Deno Etienne, DO 06/08/21 1210

## 2021-06-09 ENCOUNTER — Encounter: Payer: Self-pay | Admitting: Neurology

## 2021-06-09 ENCOUNTER — Emergency Department (HOSPITAL_COMMUNITY)
Admission: EM | Admit: 2021-06-09 | Discharge: 2021-06-09 | Disposition: A | Discharge status: Home / Self Care | Payer: BC Managed Care – PPO | Attending: Emergency Medicine | Admitting: Emergency Medicine

## 2021-06-09 DIAGNOSIS — R519 Headache, unspecified: Secondary | ICD-10-CM | POA: Diagnosis present

## 2021-06-09 DIAGNOSIS — Z7984 Long term (current) use of oral hypoglycemic drugs: Secondary | ICD-10-CM | POA: Diagnosis not present

## 2021-06-09 DIAGNOSIS — Z79899 Other long term (current) drug therapy: Secondary | ICD-10-CM | POA: Diagnosis not present

## 2021-06-09 DIAGNOSIS — E119 Type 2 diabetes mellitus without complications: Secondary | ICD-10-CM | POA: Insufficient documentation

## 2021-06-09 DIAGNOSIS — I10 Essential (primary) hypertension: Secondary | ICD-10-CM | POA: Diagnosis not present

## 2021-06-09 DIAGNOSIS — H5712 Ocular pain, left eye: Secondary | ICD-10-CM | POA: Diagnosis not present

## 2021-06-09 LAB — SEDIMENTATION RATE: Sed Rate: 25 mm/hr — ABNORMAL HIGH (ref 0–22)

## 2021-06-09 LAB — C-REACTIVE PROTEIN: CRP: 0.5 mg/dL (ref <1.0)

## 2021-06-09 MED ORDER — FLUORESCEIN SODIUM 1 MG OP STRP
1.0000 | ORAL_STRIP | Freq: Once | OPHTHALMIC | Status: AC
  Administered 2021-06-09: 1 via OPHTHALMIC
  Filled 2021-06-09: qty 1

## 2021-06-09 MED ORDER — DIPHENHYDRAMINE HCL 50 MG/ML IJ SOLN
50.0000 mg | Freq: Once | INTRAMUSCULAR | Status: AC
  Administered 2021-06-09: 50 mg via INTRAVENOUS
  Filled 2021-06-09: qty 1

## 2021-06-09 MED ORDER — HYDROMORPHONE HCL 1 MG/ML IJ SOLN
0.5000 mg | Freq: Once | INTRAMUSCULAR | Status: AC
  Administered 2021-06-09: 0.5 mg via INTRAVENOUS
  Filled 2021-06-09: qty 1

## 2021-06-09 MED ORDER — TETRACAINE HCL 0.5 % OP SOLN: 2.0000 [drp] | Freq: Once | OPHTHALMIC | Status: DC

## 2021-06-09 MED ORDER — KETOROLAC TROMETHAMINE 15 MG/ML IJ SOLN
15.0000 mg | Freq: Once | INTRAMUSCULAR | Status: AC
  Administered 2021-06-09: 15 mg via INTRAVENOUS
  Filled 2021-06-09: qty 1

## 2021-06-09 MED ORDER — MAGNESIUM SULFATE 2 GM/50ML IV SOLN
2.0000 g | Freq: Once | INTRAVENOUS | Status: AC
  Administered 2021-06-09: 2 g via INTRAVENOUS
  Filled 2021-06-09: qty 50

## 2021-06-09 MED ORDER — METOCLOPRAMIDE HCL 5 MG/ML IJ SOLN
10.0000 mg | Freq: Once | INTRAMUSCULAR | Status: AC
  Administered 2021-06-09: 10 mg via INTRAVENOUS
  Filled 2021-06-09: qty 2

## 2021-06-09 MED ORDER — SODIUM CHLORIDE 0.9 % IV BOLUS
1000.0000 mL | Freq: Once | INTRAVENOUS | Status: AC
  Administered 2021-06-09: 1000 mL via INTRAVENOUS

## 2021-06-09 MED ORDER — TETRACAINE HCL 0.5 % OP SOLN
2.0000 [drp] | Freq: Once | OPHTHALMIC | Status: AC
  Administered 2021-06-09: 2 [drp] via OPHTHALMIC
  Filled 2021-06-09: qty 4

## 2021-06-09 MED ORDER — FLUORESCEIN SODIUM 1 MG OP STRP: 1.0000 | ORAL_STRIP | Freq: Once | OPHTHALMIC | Status: DC

## 2021-06-09 NOTE — ED Notes (Signed)
Patient Alert and oriented to baseline. Stable and ambulatory to baseline. Patient verbalized understanding of the discharge instructions.  Patient belongings were taken by the patient.   

## 2021-06-09 NOTE — ED Provider Notes (Signed)
Emergency Medicine Provider Triage Evaluation Note  Shannon Oliver , a 60 y.o. female  was evaluated in triage.  Pt complains of occipital headache. States that headache has been ongoing and mostly constant for the past 6 days.  She states that she did have cataract surgery on her left eye and started having pain after this.  She states that it seems to be left-sided that radiated to the back of her head.  She had little to no relief with Percocet but she was given previously in the ER seems to have had some improvement with IV headache cocktail.    Review of Systems  Positive: HA Negative: Fever  Physical Exam  BP (!) 155/98 (BP Location: Left Arm)   Pulse 66   Temp 97.7 F (36.5 C) (Oral)   Resp 15   LMP 08/02/2013 Comment: spotting  SpO2 99%  Gen:   Awake, no distress   Resp:  Normal effort  MSK:   Moves extremities without difficulty  Other:   Alert and oriented to self, place, time and event.   Speech is fluent, clear without dysarthria or dysphasia.   Strength 5/5 in upper/lower extremities   Sensation intact in upper/lower extremities   Normal gait.  CN I not tested  CN II grossly intact visual fields bilaterally. Did not visualize posterior eye.  CN III, IV, VI PERRLA and EOMs intact bilaterally  CN V Intact sensation to sharp and light touch to the face  CN VII facial movements symmetric  CN VIII not tested  CN IX, X no uvula deviation, symmetric rise of soft palate  CN XI 5/5 SCM and trapezius strength bilaterally  CN XII Midline tongue protrusion, symmetric L/R movements    Medical Decision Making  Medically screening exam initiated at 9:22 AM.  Appropriate orders placed.  Laurene Melendrez Kanouse was informed that the remainder of the evaluation will be completed by another provider, this initial triage assessment does not replace that evaluation, and the importance of remaining in the ED until their evaluation is complete.   Extensive headache work-up in the past.   CT angio done 12/2 did show small aneurysms LP without significant RBC and clear CSF.       Shannon Oliver, [place] 06/09/21 6834    Shannon Dusky, MD 06/09/21 1004

## 2021-06-09 NOTE — ED Notes (Signed)
Pt asking for pain medicine, Triage RN notified.

## 2021-06-09 NOTE — Discharge Instructions (Addendum)
Please go directly to Dr. Zenia Resides office for further evaluation of your persistent headache/eye pain since your cataract surgery

## 2021-06-09 NOTE — ED Provider Notes (Signed)
[facility]ENC[facility] DEPARTMENT Provider Note   CSN: [id number] Arrival date & time: 06/09/21  3086     History Chief Complaint  Patient presents with   Headache    Shannon Oliver is a 60 y.o. female with PMHx Diabetes, HTN, HLD who presents to the ED today with complaint of continued persistent left occipital headache that began 5-6 days ago.  Patient reports that she had mild pain behind her left eye prior to having cataract surgery done however when she came to from the surgery she had worsening pain.  She presented to the ED on 12/02 for headache.  Pressures in her eyes were checked per Dr. Katy Fitch her ophthalmologist at that time which were normal.  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  Neuro IR was consulted and they recommended LP with concern for possible subarachnoid hemorrhage.  LP with clear CSF and no acute findings.  She was treated with headache cocktail and discharged home with plans to follow-up with neuro IR.  She presented again to the ED yesterday for persistent headache.  She underwent a repeat CT head at that time which was negative.  She was treated with a headache cocktail with improvement in her symptoms and discharged home.  She states that shortly after going home she began having persistent headache.  She has tried Tylenol, ibuprofen, Advilwithout relief of her symptoms prompting return ED visit today.  She has not followed up with her ophthalmologist.  Does have an appointment with neurology on 12/28 however states she cannot wait that long due to pain.  She does report that she feels like there is a foreign body sensation in her left eye and she is having sensitivity to light however this has been persistent since the surgery.  She states that her vision is starting to somewhat improve in that left eye.  She denies history of headaches in the past.  The history  is provided by the patient, medical records and a relative.      Past Medical History:  Diagnosis Date   Abnormal EKG    LVH with strain   Acid reflux    Depression    Diabetes mellitus    A1C over 9   HLD (hyperlipidemia)    Hypertension    Noncompliance    Obesity     Patient Active Problem List   Diagnosis Date Noted   Hyperlipidemia 04/30/2021   BMI 39.0-39.9,adult 04/17/2021   Elevated troponin 10/30/2020   AKI (acute kidney injury) ([place]) 10/30/2020   Palpitations    Chest pain 10/29/2020   Encounter for orthopedic follow-up care 08/19/2020   Pain in joint of left shoulder 05/14/2020   Hypertensive left ventricular hypertrophy, without heart failure 12/18/2018   Obesity due to excess calories without serious comorbidity 12/18/2018   Type 2 diabetes mellitus without complication, without long-term current use of insulin ([place]) 12/15/2018   Postmenopausal bleeding 06/05/2017   Neck pain 03/01/2014   Edema 01/25/2013   DM (diabetes mellitus) ([place]) 01/25/2013   Dyspnea 01/25/2013   Abnormal ECG 01/25/2013   Hypertension    Acid reflux     Past Surgical History:  Procedure Laterality Date   CATARACT EXTRACTION Left 06/04/2021   COLONOSCOPY WITH PROPOFOL N/A 04/29/2015   Procedure: COLONOSCOPY WITH PROPOFOL;  Surgeon: Garlan Fair, MD;  Location: [facility];  Service: Endoscopy;  Laterality: N/A;  HYSTEROSCOPY WITH D & C N/A 09/07/2017   Procedure: DILATATION AND CURETTAGE /HYSTEROSCOPY;  Surgeon: Sloan Leiter, MD;  Location: [facility];  Service: Gynecology;  Laterality: N/A;   KNEE ARTHROSCOPY W/ MENISCAL REPAIR Right      OB History     Gravida  3   Para  3   Term  3   Preterm  0   AB  0   Living  3      SAB  0   IAB  0   Ectopic  0   Multiple      Live Births  3           Family History  Problem Relation Age of Onset   Cancer Mother    Hypertension Mother    Diabetes Mother    Breast cancer Maternal Aunt     Breast cancer Cousin     Social History   Tobacco Use   Smoking status: Never   Smokeless tobacco: Never  Vaping Use   Vaping Use: Never used  Substance Use Topics   Alcohol use: Not Currently    Comment: occ   Drug use: No    Home Medications Prior to Admission medications   Medication Sig Start Date End Date Taking? Authorizing Provider  carvedilol (COREG) 25 MG tablet Take 25 mg by mouth 2 (two) times daily with a meal. 04/17/21 07/16/21 Yes [provider]  dapagliflozin propanediol (FARXIGA) 5 MG TABS tablet Take 2.5 mg by mouth daily. 10/01/20  Yes [provider]  losartan (COZAAR) 100 MG tablet Take 100 mg by mouth daily. 04/17/21  Yes [provider]  metFORMIN (GLUCOPHAGE) 1000 MG tablet Take 1,000 mg by mouth daily. 03/18/20  Yes [provider]  Multiple Vitamins-Calcium (ONE-A-DAY WOMENS PO) Take 1 tablet by mouth daily.   Yes [provider]  NOVOLIN 70/30 FLEXPEN (70-30) 100 UNIT/ML KwikPen Inject 16-30 Units into the skin 2 (two) times daily as needed (high blood sugar). 03/18/20  Yes [provider]  rosuvastatin (CRESTOR) 40 MG tablet Take 40 mg by mouth daily. 04/27/21  Yes [provider]  spironolactone (ALDACTONE) 25 MG tablet Take 1 tablet by mouth daily. 04/17/21 07/16/21 Yes [provider]  tiZANidine (ZANAFLEX) 2 MG tablet Take 2 mg by mouth every 12 (twelve) hours as needed for muscle pain. 02/22/20  Yes [provider]  Accu-Chek Softclix Lancets lancets Use to check blood sugar twice daily. 04/27/21   Elayne Snare, MD  amLODipine (NORVASC) 10 MG tablet TAKE 1 TABLET EVERY DAY ONCE A DAY ORALLY 90 Patient taking differently: Take 10 mg by mouth daily. 03/13/18   Elayne Snare, MD  amoxicillin (AMOXIL) 500 MG capsule SMARTSIG:2 Capsule(s) By Mouth Every 12 Hours 04/21/21   [provider]  benzonatate (TESSALON) 200 MG capsule  04/01/21   [provider]  clotrimazole  (LOTRIMIN) 1 % cream Apply 1 application topically in the morning and at bedtime. Patient not taking: Reported on 04/27/2021 10/29/20   [provider]  Continuous Blood Gluc Sensor (DEXCOM G6 SENSOR) MISC Use to monitor blood sugar, change after 10 days 03/30/21   Elayne Snare, MD  cyclobenzaprine (FLEXERIL) 10 MG tablet Take 10 mg by mouth every 6 (six) hours as needed for muscle spasms. 08/27/20   [provider]  escitalopram (LEXAPRO) 10 MG tablet Take 10 mg by mouth at bedtime. 04/06/21   [provider]  glucose blood (ACCU-CHEK GUIDE) test strip Use  as instructed to check blood sugar twice daily. 04/27/21   Elayne Snare, MD  ibuprofen (ADVIL) 800 MG tablet Take by mouth. 05/21/21   [provider]  Insulin Syringe-Needle U-100 30G X 1/2" 0.3 ML MISC Use twice a day with insulin 10/17/17   Elayne Snare, MD  ketorolac (ACULAR) 0.5 % ophthalmic solution SMARTSIG:In Eye(s) 05/19/21   [provider]  loratadine (CLARITIN) 10 MG tablet Take 10 mg by mouth daily as needed for allergies. Patient not taking: Reported on 04/27/2021    [provider]  losartan-hydrochlorothiazide (HYZAAR) 100-25 MG per tablet Take 1 tablet by mouth every morning.  Patient not taking: Reported on 04/27/2021    [provider]  metoprolol succinate (TOPROL-XL) 100 MG 24 hr tablet Take 100 mg by mouth daily.  Patient not taking: Reported on 04/27/2021 09/14/17   [provider]  naproxen (NAPROSYN) 500 MG tablet Take 500 mg by mouth 2 (two) times daily as needed for moderate pain. Patient not taking: Reported on 04/27/2021 08/19/20   [provider]  ondansetron (ZOFRAN) 4 MG tablet Take 4 mg by mouth every 6 (six) hours as needed for vomiting or nausea. 08/19/20   [provider]  oxyCODONE-acetaminophen (PERCOCET/ROXICET) 5-325 MG tablet Take 1 tablet by mouth every 6 (six) hours as needed for up to 5 doses for severe pain. 04/25/21    Luna Fuse, MD  potassium chloride SA (KLOR-CON M20) 20 MEQ tablet Take 1 tablet (20 mEq total) by mouth daily. Patient not taking: Reported on 04/27/2021 03/30/21   Elayne Snare, MD  prednisoLONE acetate (PRED FORTE) 1 % ophthalmic suspension SMARTSIG:In Eye(s) 05/19/21   [provider]  rosuvastatin (CRESTOR) 20 MG tablet Take 20 mg by mouth at bedtime. Patient not taking: Reported on 04/27/2021 10/20/20   [provider]    Allergies    Canagliflozin and Hydrocodone  Review of Systems   Review of Systems  Constitutional:  Negative for chills and fever.  Eyes:  Positive for photophobia, pain and visual disturbance.  Gastrointestinal:  Positive for nausea. Negative for vomiting.  Neurological:  Positive for headaches. Negative for weakness and numbness.  All other systems reviewed and are negative.  Physical Exam Updated Vital Signs BP (!) 166/79   Pulse 100   Temp 97.7 F (36.5 C) (Oral)   Resp 18   LMP 08/02/2013 Comment: spotting  SpO2 97%   Physical Exam Vitals and nursing note reviewed.  Constitutional:      Appearance: She is not ill-appearing or diaphoretic.  HENT:     Head: Normocephalic and atraumatic.  Eyes:     Intraocular pressure: Left eye pressure is 18 mmHg.     Extraocular Movements: Extraocular movements intact.     Conjunctiva/sclera: Conjunctivae normal.     Pupils: Pupils are equal, round, and reactive to light.     Left eye: No fluorescein uptake.  Neck:     Meningeal: Brudzinski's sign and Kernig's sign absent.  Cardiovascular:     Rate and Rhythm: Normal rate and regular rhythm.  Pulmonary:     Effort: Pulmonary effort is normal.     Breath sounds: Normal breath sounds. No wheezing, rhonchi or rales.  Abdominal:     Palpations: Abdomen is soft.     Tenderness: There is no abdominal tenderness.  Musculoskeletal:     Cervical back: Neck supple.  Skin:    General: Skin is warm and dry.  Neurological:     Mental Status:  She  is alert.     Comments: Alert and oriented to self, place, time and event.   Speech is fluent, clear without dysarthria or dysphasia.   Strength 5/5 in upper/lower extremities   Sensation intact in upper/lower extremities   Normal gait.  Negative Romberg. No pronator drift.  Normal finger-to-nose and feet tapping.  CN I not tested  CN II grossly intact visual fields bilaterally. Did not visualize posterior eye.  CN III, IV, VI PERRLA and EOMs intact bilaterally  CN V Intact sensation to sharp and light touch to the face  CN VII facial movements symmetric  CN VIII not tested  CN IX, X no uvula deviation, symmetric rise of soft palate  CN XI 5/5 SCM and trapezius strength bilaterally  CN XII Midline tongue protrusion, symmetric L/R movements      ED Results / Procedures / Treatments   Labs (all labs ordered are listed, but only abnormal results are displayed) Labs Reviewed  SEDIMENTATION RATE - Abnormal; Notable for the following components:      Result Value   Sed Rate 25 (*)    All other components within normal limits  C-REACTIVE PROTEIN    EKG None  Radiology CT HEAD WO CONTRAST (5MM)  Result Date: 06/08/2021 CLINICAL DATA:  Headache, intracranial hemorrhage suspected. Sudden worsening. History of cerebral aneurysms. EXAM: CT HEAD WITHOUT CONTRAST TECHNIQUE: Contiguous axial images were obtained from the base of the skull through the vertex without intravenous contrast. COMPARISON:  06/05/2021 FINDINGS: Brain: No sign of acute infarction. There chronic small-vessel ischemic changes of the cerebral hemispheric white matter. No mass lesion, hemorrhage, hydrocephalus or extra-axial collection. Vascular: No abnormal vascular finding by CT. Skull: Normal Sinuses/Orbits: Clear/normal Other: None IMPRESSION: No acute CT finding. Chronic small-vessel ischemic changes of the cerebral hemispheric white matter. No visible intracranial hemorrhage. Electronically Signed   By: Nelson Chimes M.D.   On: 06/08/2021 09:12    Procedures Procedures   Medications Ordered in ED Medications  HYDROmorphone (DILAUDID) injection 0.5 mg (has no administration in time range)  ketorolac (TORADOL) 15 MG/ML injection 15 mg (15 mg Intravenous Given 06/09/21 1206)  metoCLOPramide (REGLAN) injection 10 mg (10 mg Intravenous Given 06/09/21 1206)  diphenhydrAMINE (BENADRYL) injection 50 mg (50 mg Intravenous Given 06/09/21 1206)  sodium chloride 0.9 % bolus 1,000 mL (0 mLs Intravenous Stopped 06/09/21 1344)  fluorescein ophthalmic strip 1 strip (1 strip Left Eye Given 06/09/21 1207)  tetracaine (PONTOCAINE) 0.5 % ophthalmic solution 2 drop (2 drops Left Eye Given 06/09/21 1206)  magnesium sulfate IVPB 2 g 50 mL (2 g Intravenous New Bag/Given 06/09/21 1404)    ED Course  I have reviewed the triage vital signs and the nursing notes.  Pertinent labs & imaging results that were available during my care of the patient were reviewed by me and considered in my medical decision making (see chart for details).    MDM Rules/Calculators/A&P                           60 year old female who presents to the ED today with complaint of persistent headaches for the past 5 to 6 days.  Recent left cataract surgery done just prior to headache beginning.  Also complaining of slight foreign body sensation to left eye.  On arrival to the ED today vitals are stable.  Patient appears to be in no acute distress.  She has no focal neurodeficits on exam.  She has underwent 2  CT scans, CT angio, lumbar puncture as well as 2 different headache cocktails with mild relief of her headache.  The CTA did show 2 small aneurysms and she has plans to see neurology on 12/28 for same.  Do not feel patient requires repeat CT imaging at this time.  We will plan for headache cocktail and reevaluation.  Given foreign body sensation we will plan for further evaluation with possibility that her eye could be causing her headache.  I have  reconsulted Dr. Katy Fitch; commends adding on inflammatory marker labs at this time.  Ultimately patient will need to follow-up outpatient with ophthalmology as her headache could truly be related to her eyes/recent surgery.   Fluorescein uptake negative IOP normal at 18 mmHg  CRP 0.5 Sed rate slightly elevated at 25  Pt provided with headache cocktail including magnesium with mild improvement in her headache. She was advised she will need to follow up with ophthalmology; while in the ED she called Dr. Zenia Resides office and they are able to see her at 4 PM today. She states she would like an additional pain med and then be discharged; 0.5 mg dilaudid provided for patient upon discharge. Son to take patient to next appointment.   This note was prepared using Dragon voice recognition software and may include unintentional dictation errors due to the inherent limitations of voice recognition software.   Final Clinical Impression(s) / ED Diagnoses Final diagnoses:  Headache disorder  Pain of left eye    Rx / DC Orders ED Discharge Orders     None        Discharge Instructions      Please go directly to Dr. Zenia Resides office for further evaluation of your persistent headache/eye pain since your cataract surgery       Eustaquio Maize, PA-C 06/09/21 1509    Godfrey Pick, MD 06/10/21 [phone]

## 2021-06-09 NOTE — ED Triage Notes (Signed)
Pt. Stated, I have a headache in the back of my head.  This is the 3rd visit to ER for pain.  Here yesterday given percocet left went to [facility]

## 2021-06-10 ENCOUNTER — Other Ambulatory Visit (HOSPITAL_COMMUNITY): Payer: Self-pay | Admitting: Neuroradiology

## 2021-06-10 DIAGNOSIS — I671 Cerebral aneurysm, nonruptured: Secondary | ICD-10-CM

## 2021-06-11 ENCOUNTER — Ambulatory Visit (INDEPENDENT_AMBULATORY_CARE_PROVIDER_SITE_OTHER): Payer: BC Managed Care – PPO | Admitting: Podiatry

## 2021-06-11 ENCOUNTER — Other Ambulatory Visit: Payer: Self-pay

## 2021-06-11 DIAGNOSIS — M722 Plantar fascial fibromatosis: Secondary | ICD-10-CM | POA: Diagnosis not present

## 2021-06-12 ENCOUNTER — Ambulatory Visit (HOSPITAL_COMMUNITY)
Admission: RE | Admit: 2021-06-12 | Discharge: 2021-06-12 | Disposition: A | Discharge status: Home / Self Care | Payer: BC Managed Care – PPO | Source: Ambulatory Visit | Attending: Neuroradiology | Admitting: Neuroradiology

## 2021-06-12 DIAGNOSIS — I671 Cerebral aneurysm, nonruptured: Secondary | ICD-10-CM

## 2021-06-12 NOTE — Consult Note (Signed)
Chief Complaint: Patient was seen in consultation today for intracranial aneurysms.  Supervising Physician: Pedro Earls  Patient Status: [facility] - Out-pt  History of Present Illness: Shannon Oliver is a 60 y.o. female with a past medical history of Shannon Oliver is a 60 year old female with past medical history significant for diabetes, hyperlipidemia, hypertension and cataract.  She presented to the emergency room with headache on 06/05/2021.  She reports headache initial in mild comment progressively worsened with associated nausea.  She had undergone cataract surgery the day before, without any reported complication.  Head CT was performed and showed no acute intracranial abnormality.  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  Due to severe headache in the setting of intracranial aneurysm, a lumbar puncture was performed to exclude subarachnoid hemorrhage but was negative.  He has returned to the ER since then with continued recurrent episodes of headache. She states the headache usually has gradual onset, mostly in the occipital region with associated nausea. No focal neurological deficits are associated.  She was referred to ask for evaluation and management of her brain aneurysms.  She has no known family history of ruptured aneurysm.  No presence of past history of smoking.    Past Medical History:  Diagnosis Date   Abnormal EKG    LVH with strain   Acid reflux    Depression    Diabetes mellitus    A1C over 9   HLD (hyperlipidemia)    Hypertension    Noncompliance    Obesity     Past Surgical History:  Procedure Laterality Date   CATARACT EXTRACTION Left 06/04/2021   COLONOSCOPY WITH PROPOFOL N/A 04/29/2015   Procedure: COLONOSCOPY WITH PROPOFOL;  Surgeon: Garlan Fair, MD;  Location: [facility];  Service: Endoscopy;  Laterality: N/A;   HYSTEROSCOPY WITH D & C N/A  09/07/2017   Procedure: DILATATION AND CURETTAGE /HYSTEROSCOPY;  Surgeon: Sloan Leiter, MD;  Location: [place];  Service: Gynecology;  Laterality: N/A;   KNEE ARTHROSCOPY W/ MENISCAL REPAIR Right     Allergies: Canagliflozin and Hydrocodone  Medications: Prior to Admission medications   Medication Sig Start Date End Date Taking? Authorizing Provider  Accu-Chek Softclix Lancets lancets Use to check blood sugar twice daily. 04/27/21   Elayne Snare, MD  amLODipine (NORVASC) 10 MG tablet TAKE 1 TABLET EVERY DAY ONCE A DAY ORALLY 90 Patient not taking: Reported on 06/09/2021 03/13/18   Elayne Snare, MD  carvedilol (COREG) 25 MG tablet Take 25 mg by mouth 2 (two) times daily with a meal. 04/17/21 07/16/21  [provider]  clotrimazole (LOTRIMIN) 1 % cream Apply 1 application topically in the morning and at bedtime. Patient not taking: Reported on 04/27/2021 10/29/20   [provider]  Continuous Blood Gluc Sensor (DEXCOM G6 SENSOR) MISC Use to monitor blood sugar, change after 10 days 03/30/21   Elayne Snare, MD  dapagliflozin propanediol (FARXIGA) 5 MG TABS tablet Take 2.5 mg by mouth daily. 10/01/20   [provider]  glucose blood (ACCU-CHEK GUIDE) test strip Use as instructed to check blood sugar twice daily. 04/27/21   Elayne Snare, MD  Insulin Syringe-Needle U-100 30G X 1/2" 0.3 ML MISC Use twice a day with insulin 10/17/17   Elayne Snare, MD  losartan (COZAAR) 100 MG tablet Take 100 mg by mouth daily. 04/17/21   [provider]  metFORMIN (GLUCOPHAGE)  1000 MG tablet Take 1,000 mg by mouth daily. 03/18/20   [provider]  Multiple Vitamins-Calcium (ONE-A-DAY WOMENS PO) Take 1 tablet by mouth daily.    [provider]  NOVOLIN 70/30 FLEXPEN (70-30) 100 UNIT/ML KwikPen Inject 16-30 Units into the skin 2 (two) times daily as needed (high blood sugar). 03/18/20   [provider]  oxyCODONE-acetaminophen (PERCOCET/ROXICET)  5-325 MG tablet Take 1 tablet by mouth every 6 (six) hours as needed for up to 5 doses for severe pain. Patient not taking: Reported on 06/09/2021 04/25/21   Luna Fuse, MD  potassium chloride SA (KLOR-CON M20) 20 MEQ tablet Take 1 tablet (20 mEq total) by mouth daily. Patient not taking: Reported on 04/27/2021 03/30/21   Elayne Snare, MD  rosuvastatin (CRESTOR) 40 MG tablet Take 40 mg by mouth daily. 04/27/21   [provider]  spironolactone (ALDACTONE) 25 MG tablet Take 1 tablet by mouth daily. 04/17/21 07/16/21  [provider]  tiZANidine (ZANAFLEX) 2 MG tablet Take 2 mg by mouth every 12 (twelve) hours as needed for muscle pain. 02/22/20   [provider]     Family History  Problem Relation Age of Onset   Cancer Mother    Hypertension Mother    Diabetes Mother    Breast cancer Maternal Aunt    Breast cancer Cousin     Social History   Socioeconomic History   Marital status: Married    Spouse name: Not on file   Number of children: Not on file   Years of education: Not on file   Highest education level: Not on file  Occupational History   Not on file  Tobacco Use   Smoking status: Never   Smokeless tobacco: Never  Vaping Use   Vaping Use: Never used  Substance and Sexual Activity   Alcohol use: Not Currently    Comment: occ   Drug use: No   Sexual activity: Not on file  Other Topics Concern   Not on file  Social History Narrative   ** Merged History Encounter **       Social Determinants of Health   Financial Resource Strain: Not on file  Food Insecurity: Not on file  Transportation Needs: Not on file  Physical Activity: Not on file  Stress: Not on file  Social Connections: Not on file     Review of Systems: A 12 point ROS discussed and pertinent positives are indicated in the HPI above.  All other systems are negative.  Review of Systems  Vital Signs: LMP 08/02/2013 Comment: spotting  Physical Exam Constitutional:       General: She is not in acute distress. Neurological:     Mental Status: She is alert and oriented to person, place, and time.     Cranial Nerves: Cranial nerves 2-12 are intact.     Sensory: Sensation is intact.     Motor: Motor function is intact.     Gait: Gait is intact.         Imaging: CT Angio Head W or Wo Contrast  Result Date: 06/05/2021 CLINICAL DATA:  Neuro deficit, acute, stroke suspected. Additional history provided: Severe headache, left-sided, concern for aneurysm, stroke, subarachnoid hemorrhage. EXAM: CT ANGIOGRAPHY HEAD TECHNIQUE: Multidetector CT imaging of the head was performed using the standard protocol during bolus administration of intravenous contrast. Multiplanar CT image reconstructions and MIPs were obtained to evaluate the vascular anatomy. CONTRAST:  74mL OMNIPAQUE IOHEXOL 350 MG/ML SOLN COMPARISON:  Head CT  03/23/2020. FINDINGS: CT HEAD Brain: Cerebral volume is normal for age. Patchy and ill-defined hypoattenuation within the cerebral white matter, nonspecific but most often secondary to chronic small vessel ischemia. There is no acute intracranial hemorrhage. No demarcated cortical infarct. No extra-axial fluid collection. No evidence of an intracranial mass. No midline shift. Vascular: No hyperdense vessel. Skull: Normal. Negative for fracture or focal lesion. Orbits: No acute or significant orbital finding. Sinuses: No significant paranasal sinus disease at the imaged levels. CTA HEAD Anterior circulation: The intracranial internal carotid arteries are patent. The M1 middle cerebral arteries are patent. No M2 proximal branch occlusion or high-grade proximal stenosis is identified. The anterior cerebral arteries are patent. The right A1 segment is hypoplastic. 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 4 mm superiorly projecting aneurysm arising from the left ICA terminus at the origin of the left  anterior and middle cerebral arteries (series 8, image 110) (series 10, image 128). Posterior circulation: The intracranial vertebral arteries are patent. The basilar artery is patent. The posterior cerebral arteries are patent. Posterior communicating arteries are present bilaterally. Venous sinuses: Within the limitations of contrast timing, no convincing thrombus. Anatomic variants: As described. Review of the MIP images confirms the above findings. IMPRESSION: CT head: 1. No evidence of acute intracranial hemorrhage or acute infarction. 2. Patchy and ill-defined hypoattenuation within the cerebral white matter, nonspecific but most often secondary to chronic small vessel ischemia. CTA head: 1. 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 2. 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 3. No intracranial large vessel occlusion or proximal high-grade arterial stenosis. Electronically Signed   By: Kellie Simmering D.O.   On: 06/05/2021 09:23   CT HEAD WO CONTRAST (5MM)  Result Date: 06/08/2021 CLINICAL DATA:  Headache, intracranial hemorrhage suspected. Sudden worsening. History of cerebral aneurysms. EXAM: CT HEAD WITHOUT CONTRAST TECHNIQUE: Contiguous axial images were obtained from the base of the skull through the vertex without intravenous contrast. COMPARISON:  06/05/2021 FINDINGS: Brain: No sign of acute infarction. There chronic small-vessel ischemic changes of the cerebral hemispheric white matter. No mass lesion, hemorrhage, hydrocephalus or extra-axial collection. Vascular: No abnormal vascular finding by CT. Skull: Normal Sinuses/Orbits: Clear/normal Other: None IMPRESSION: No acute CT finding. Chronic small-vessel ischemic changes of the cerebral hemispheric white matter. No visible intracranial hemorrhage. Electronically Signed   By: Nelson Chimes M.D.   On: 06/08/2021  09:12    Labs:  CBC: Recent Labs    10/29/20 1335 06/05/21 0720 06/08/21 0829  WBC 5.6 5.0 5.5  HGB 12.9 11.4* 12.7  HCT 39.1 35.7* 40.7  PLT 225 196 231    COAGS: No results for input(s): INR, APTT in the last 8760 hours.  BMP: Recent Labs    10/29/20 1335 10/30/20 0135 03/27/21 1400 04/22/21 1405 06/05/21 0720 06/08/21 0829  NA 137 138 140 140 139 137  K 4.0 3.5 3.4* 3.4* 3.8 3.8  CL 99 100 104 106 105 105  CO2 28 28 25 24 26 25   GLUCOSE 469* 262* 120* 246* 144* 160*  BUN 18 15 20 23 17 19   CALCIUM 9.8 9.6 9.4 9.4 9.7 9.3  CREATININE 1.02* 0.85 0.91 0.96 0.82 0.82  GFRNONAA >60 >60  --   --  >60 >60    LIVER FUNCTION TESTS: Recent Labs    09/05/20 1522 06/08/21 0829  BILITOT 0.4 0.4  AST 21 19  ALT 20 19  ALKPHOS 80 49  PROT 7.6 7.6  ALBUMIN 4.0 3.9    TUMOR MARKERS: No results for input(s): AFPTM, CEA, CA199, CHROMGRNA in the last 8760 hours.  Assessment and Plan:  I reviewed imaging findings of the CT angiogram with Shannon Oliver and explained that it is unlikely that these aneurysm are the cause of her headache given location, size, negative head CT and negative lumbar puncture.  I explained that a diagnostic cerebral angiogram could better evaluate size and morphology of the aneurysms and its relationship with the parent artery.  This can be performed electively.  I also reviewed with her signs and symptoms of a  ruptured cerebral aneurysm and she was instructed to call [phone] should this happen.  She agrees with the plan and would like to schedule her cerebral angiogram.  All questions were answered to her satisfaction.  Our scheduler will reach out to her to book a diagnostic cerebral angiogram.   Thank you for this interesting consult.  I greatly enjoyed meeting Shannon Oliver and look forward to participating in their care.  A copy of this report was sent to the requesting provider on this date.  Electronically Signed: Pedro Earls, MD 06/12/2021, 4:12 PM   I spent a total of  30 Minutes   in face to face in clinical consultation, greater than 50% of which was counseling/coordinating care for cerebral aneurysms.

## 2021-06-13 NOTE — Progress Notes (Signed)
She presents today states that the foot pain is much better since last visit.  She states that the plantar fascial brace in the compression socks have been very helpful still experiencing some pain.  She states that she is better than 25% improved.  Objective: Vital signs are stable alert into x3.  Stress tenderness on palpation medial calcaneal tubercles.  Assessment: Improving Planter fasciitis.  Plan: I will follow-up with her in 1 month she will continue all other conservative therapies may need to consider another injection.

## 2021-06-14 ENCOUNTER — Other Ambulatory Visit: Payer: Self-pay | Admitting: Endocrinology

## 2021-06-16 ENCOUNTER — Other Ambulatory Visit (HOSPITAL_COMMUNITY): Payer: Self-pay | Admitting: Neuroradiology

## 2021-06-16 DIAGNOSIS — I671 Cerebral aneurysm, nonruptured: Secondary | ICD-10-CM

## 2021-06-17 ENCOUNTER — Other Ambulatory Visit: Payer: Self-pay | Admitting: Radiology

## 2021-06-18 ENCOUNTER — Ambulatory Visit (HOSPITAL_COMMUNITY)
Admission: RE | Admit: 2021-06-18 | Discharge: 2021-06-18 | Disposition: A | Discharge status: Home / Self Care | Payer: BC Managed Care – PPO | Source: Ambulatory Visit | Attending: Neuroradiology | Admitting: Neuroradiology

## 2021-06-18 ENCOUNTER — Other Ambulatory Visit: Payer: Self-pay

## 2021-06-18 ENCOUNTER — Other Ambulatory Visit (HOSPITAL_COMMUNITY): Payer: Self-pay | Admitting: Neuroradiology

## 2021-06-18 DIAGNOSIS — E785 Hyperlipidemia, unspecified: Secondary | ICD-10-CM | POA: Insufficient documentation

## 2021-06-18 DIAGNOSIS — E1136 Type 2 diabetes mellitus with diabetic cataract: Secondary | ICD-10-CM | POA: Insufficient documentation

## 2021-06-18 DIAGNOSIS — I671 Cerebral aneurysm, nonruptured: Secondary | ICD-10-CM

## 2021-06-18 DIAGNOSIS — I10 Essential (primary) hypertension: Secondary | ICD-10-CM | POA: Diagnosis not present

## 2021-06-18 HISTORY — PX: IR US GUIDE VASC ACCESS RIGHT: IMG2390

## 2021-06-18 HISTORY — PX: IR ANGIO INTRA EXTRACRAN SEL INTERNAL CAROTID BILAT MOD SED: IMG5363

## 2021-06-18 HISTORY — PX: IR ANGIO VERTEBRAL SEL VERTEBRAL UNI R MOD SED: IMG5368

## 2021-06-18 HISTORY — PX: IR ANGIO INTRA EXTRACRAN SEL COM CAROTID INNOMINATE UNI R MOD SED: IMG5359

## 2021-06-18 HISTORY — PX: IR ANGIO INTRA EXTRACRAN SEL INTERNAL CAROTID UNI L MOD SED: IMG5361

## 2021-06-18 LAB — CBC
HCT: 38.8 % (ref 36.0–46.0)
Hemoglobin: 12.4 g/dL (ref 12.0–15.0)
MCH: 29.2 pg (ref 26.0–34.0)
MCHC: 32 g/dL (ref 30.0–36.0)
MCV: 91.3 fL (ref 80.0–100.0)
Platelets: 251 10*3/uL (ref 150–400)
RBC: 4.25 MIL/uL (ref 3.87–5.11)
RDW: 14.2 % (ref 11.5–15.5)
WBC: 5.2 10*3/uL (ref 4.0–10.5)
nRBC: 0 % (ref 0.0–0.2)

## 2021-06-18 LAB — BASIC METABOLIC PANEL
Anion gap: 6 (ref 5–15)
BUN: 14 mg/dL (ref 6–20)
CO2: 27 mmol/L (ref 22–32)
Calcium: 9.6 mg/dL (ref 8.9–10.3)
Chloride: 106 mmol/L (ref 98–111)
Creatinine, Ser: 0.88 mg/dL (ref 0.44–1.00)
GFR, Estimated: >60 mL/min (ref >60)
Glucose, Bld: 136 mg/dL — ABNORMAL HIGH (ref 70–99)
Potassium: 3.9 mmol/L (ref 3.5–5.1)
Sodium: 139 mmol/L (ref 135–145)

## 2021-06-18 LAB — GLUCOSE, CAPILLARY: Glucose-Capillary: 130 mg/dL — ABNORMAL HIGH (ref 70–99)

## 2021-06-18 LAB — PROTIME-INR
INR: 1.1 (ref 0.8–1.2)
Prothrombin Time: 14.3 seconds (ref 11.4–15.2)

## 2021-06-18 IMAGING — XA IR VERTEBRAL  SELECTIVE UNILAT RIGHT (MS)
11 of 18 series · 12 of 24 positions shown · IV contrast (IODINE)
Comparison: CT angiogram head [DATE].

INDICATION: NATALY is a 60 y.o. female with a past medical history of
NATALY is a 60 year old female with past medical history
significant for diabetes, hyperlipidemia, hypertension and cataract.
She presented to the emergency room with headache on [DATE]. She
reports headache initial in mild comment progressively worsened with
associated nausea. She had undergone cataract surgery the day
before, without any reported complication. Head CT was performed and
showed no acute intracranial abnormality. CT angiogram, however,
showed a 4 mm left ICA terminus aneurysm and a 3 mm right superior
hypophyseal artery aneurysm. Due to severe headache in the setting
of intracranial aneurysm, a lumbar puncture was performed to exclude
subarachnoid hemorrhage but was negative. He has returned to the ER
since then with continued recurrent episodes of headache. She states
the headache usually has gradual onset, mostly in the occipital
region with associated nausea. No focal neurological deficits are
associated. She comes to our service today for a diagnostic cerebral
angiogram to evaluated brain aneurysms.

EXAM:
ULTRASOUND GUIDED VASCULAR [REDACTED] CEREBRAL ANGIOGRAM
TECHNIQUE: Informed written consent was obtained from the patient after a
thorough discussion of the procedural risks, benefits and
alternatives. All questions were addressed.

[Series 1: fl neuro n · 1 of 8 frames shown (1 of 2)]
[frame 7/8]
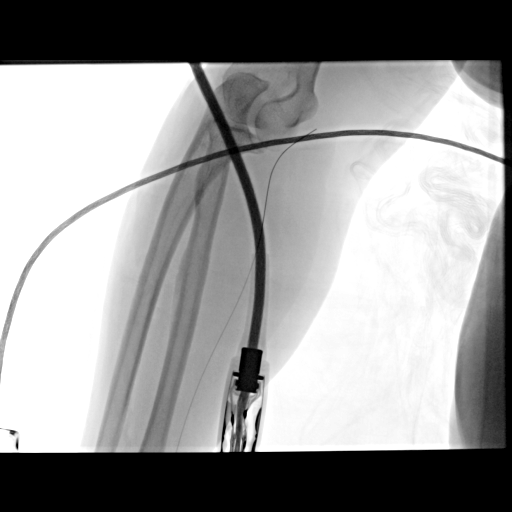

[Series 3: n roadmap · 1 of 75 frames shown]
[frame 12/75]
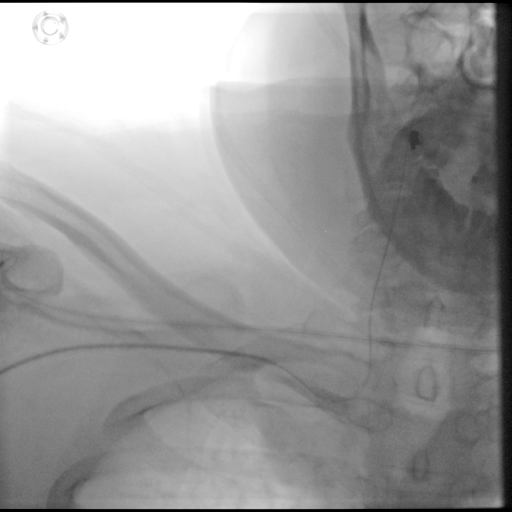

[Series 4: cerebral care 2 · 2 acquisitions, 1 frame shown (1 of 7)]
[im 1/2]
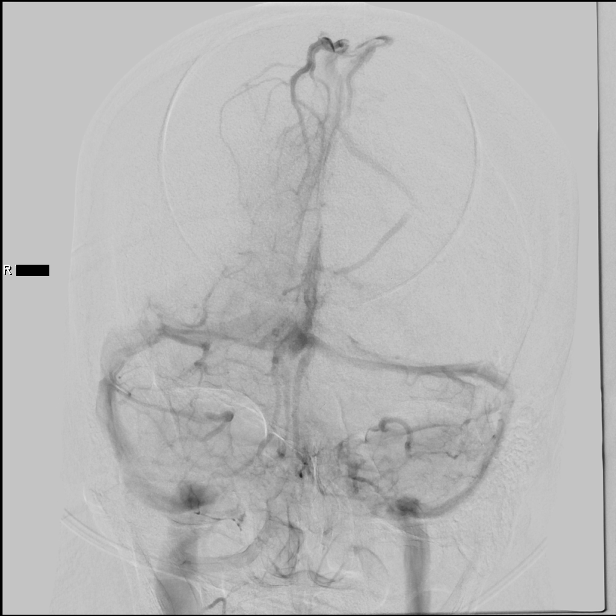

[Series 6: fl neuro n · 1 of 55 frames shown (2 of 2)]
[frame 28/55]
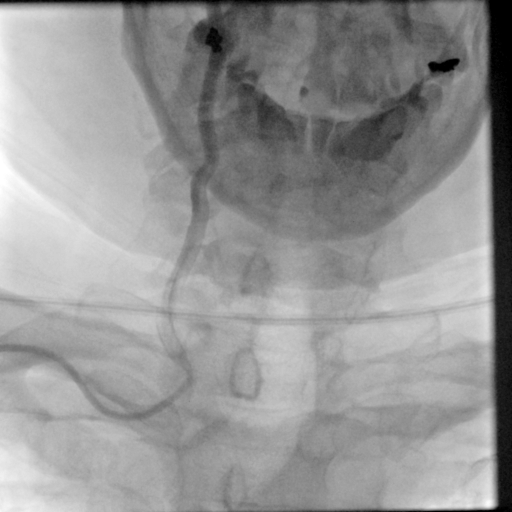

[Series 8: cerebral care 2 · 2 acquisitions, 1 frame shown (2 of 7)]
[im 1/2]
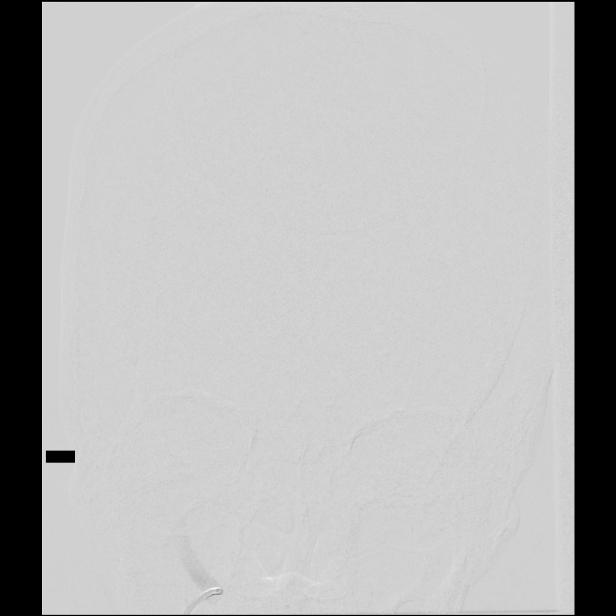

[Series 9: cerebral care 2 · 2 acquisitions, 1 frame shown (3 of 7)]
[im 1/2]
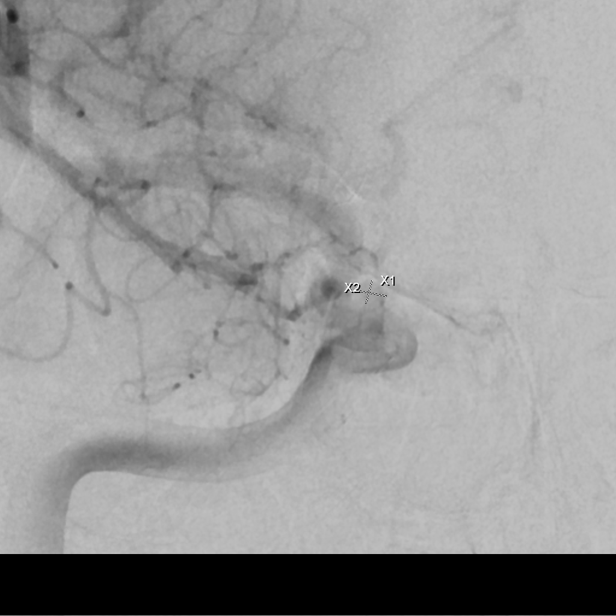

[Series 10: cerebral care 2 · 2 acquisitions, 1 frame shown (4 of 7)]
[im 1/2]
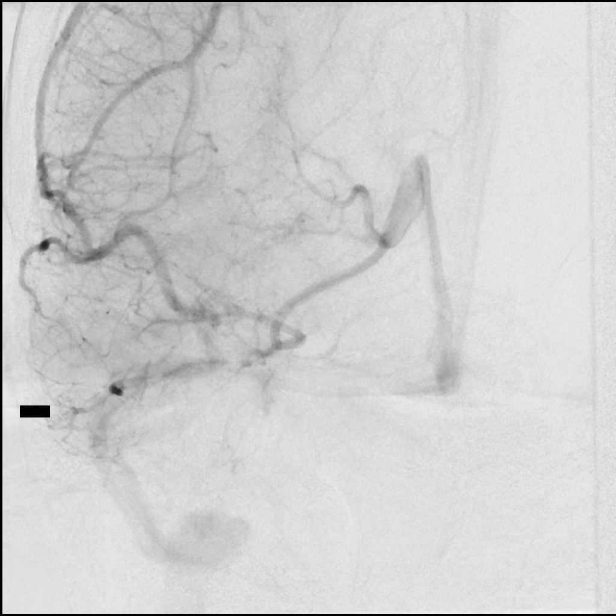

[Series 12: cerebral care 2 · 2 acquisitions, 1 frame shown (5 of 7)]
[im 1/2]
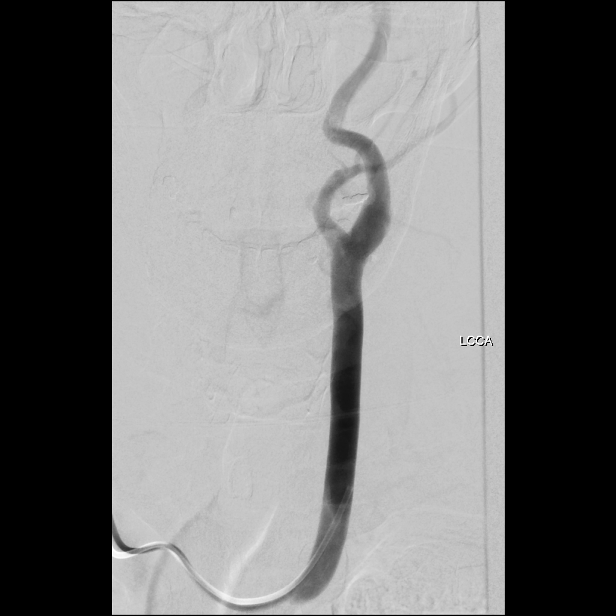

[Series 13: cerebral care 2 · 2 acquisitions, 1 frame shown (6 of 7)]
[im 1/2]
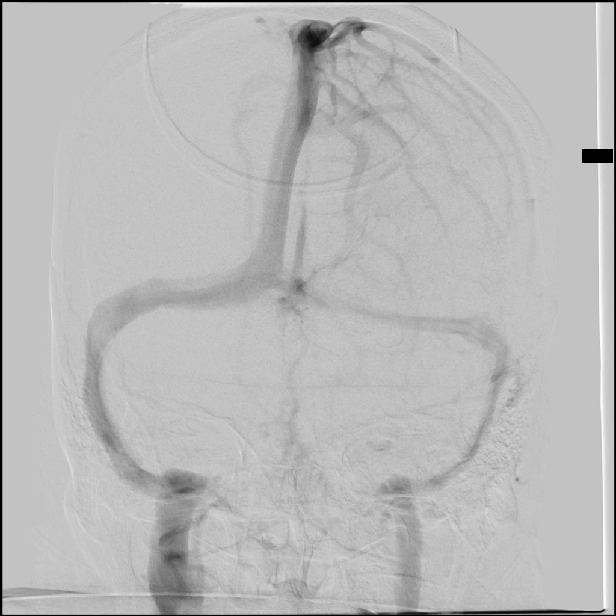

[Series 15: cerebral care 2 · 2 acquisitions, 1 frame shown (7 of 7)]
[im 1/2]
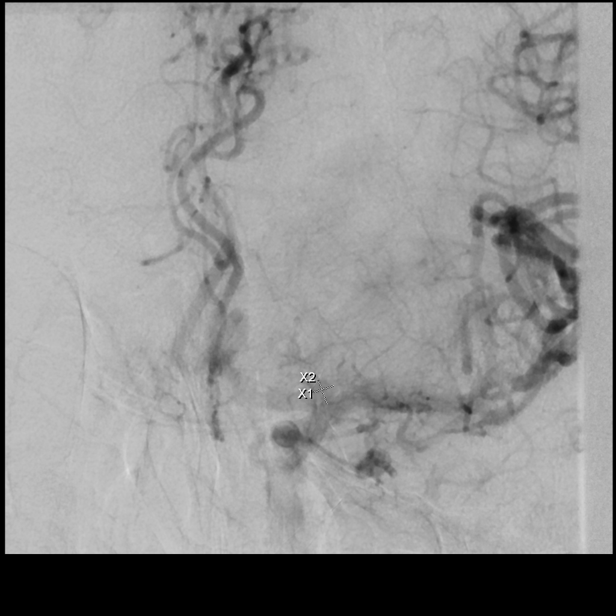

[Series 300: dr. (person_name) · 2 of 30 slices shown]
[im 1/30]
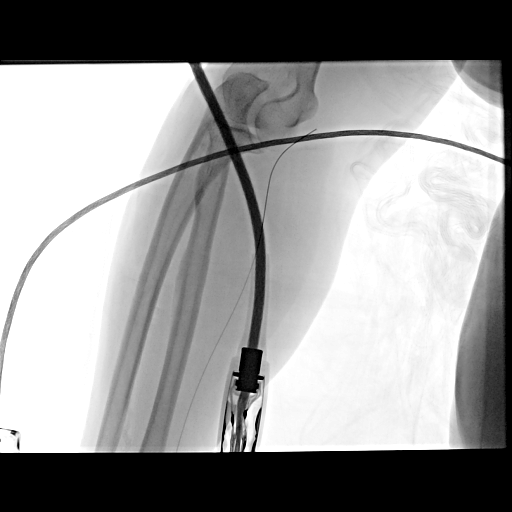
[im 30/30]
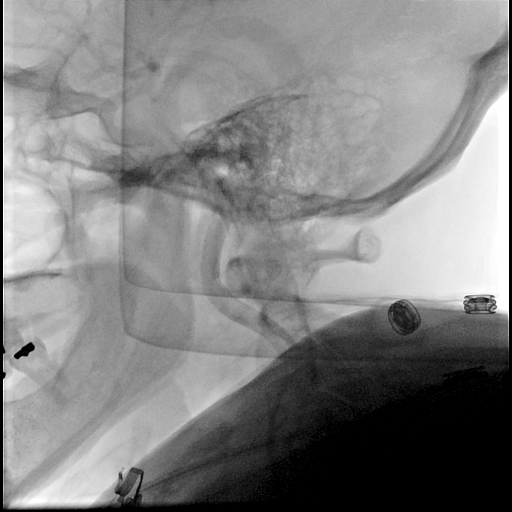

[12 of 24 positions shown; findings below may reference images not displayed]

MEDICATIONS:
5,000 NATALY heparin and 300 mcg nitroglycerin IA.

ANESTHESIA/SEDATION:
Moderate (conscious) sedation was employed during this procedure. A
total of Versed 1 mg and Fentanyl 20 mcg was administered
intravenously by the radiology nurse.

Total intra-service moderate Sedation Time: 80 minutes. The
patient's level of consciousness and vital signs were monitored
continuously by radiology nursing throughout the procedure under my
direct supervision.

CONTRAST:  96 mL of Omnipaque 300 mg/mL

FLUOROSCOPY TIME:  Fluoroscopy Time: 13 minutes 18 seconds (640
mGy).

COMPLICATIONS:
None immediate.
Maximal Sterile Barrier Technique was utilized including caps, mask,
sterile gowns, sterile gloves, sterile drape, hand hygiene and skin
antiseptic. A timeout was performed prior to the initiation of the
procedure.

Using the modified Seldinger technique and a micropuncture kit,
access was gained to the distal right radial artery at the
anatomical snuffbox and a 5 French sheath was placed. Real-time
ultrasound guidance was utilized for vascular access including the
acquisition of a permanent ultrasound image documenting patency of
the accessed vessel. Slow intra arterial infusion of 5,000 NATALY
heparin and 300 mcg nitroglycerin diluted in patient's own blood was
performed. No significant fluctuation in patient's blood pressure
seen. Then, a right radial artery angiogram was obtained via sheath
side port. Normal brachial artery branching pattern seen. No
significant anatomical variation. The right radial artery caliber is
adequate for vascular access.

Next, a 5 NATALY 2 glide catheter was navigated over a
0.035" Terumo Glidewire into the right subclavian artery under
fluoroscopic guidance. Using road map guidance, the catheter was
placed into the right vertebral artery. Frontal, lateral and
magnified bilateral oblique angiograms of the head were obtained.

The catheter was subsequently placed into the right common carotid
artery. Frontal and lateral angiograms of the neck were obtained.
Using biplane roadmap, the catheter was advanced over the wire into
the right internal carotid artery. Frontal, lateral and magnified
bilateral oblique angiograms of the head were obtained.

The catheter was subsequently placed into the left common carotid
artery. Frontal and lateral angiograms of the neck were obtained.
Using biplane roadmap, the catheter was advanced over the wire into
the left internal carotid artery. Frontal, lateral and magnified
bilateral oblique angiograms of the head were obtained.

The catheter was subsequently withdrawn.

An inflatable band was placed and inflated over the right hand
access site. The vascular sheath was withdrawn and the band was
slowly deflated until brisk flow was noted through the arteriotomy
site. At this point, the band was reinflated with additional 2 cc of
air to obtain patent hemostasis.
FINDINGS: Right radial artery ultrasound and right radial artery angiogram:
The caliber of the distal right radial artery is appropriate for
angiogram access. The right radial artery and the right ulnar artery
have normal course and caliber. No significant anatomical variants
noted.

Right vertebral artery angiograms: The right vertebral artery,
basilar artery, and bilateral posterior cerebral arteries are
unremarkable. Bilateral AICA-PICA complex noted. Luminal caliber is
smooth and tapering. No aneurysms or abnormally high-flow, early
draining veins are seen. No regions of abnormal hypervascularity are
noted. The visualized dural sinuses are patent.

Right CCA angiograms: Cervical angiograms show atherosclerotic
changes of the carotid bulb without significant stenoses.

Right ICA angiograms: There is a 2.75 mm saccular aneurysm para
ophthalmic aneurysm projecting superiorly and medially. A 2 mm
infundibulum versus aneurysm is also seen at the origin of the right
ophthalmic artery. There is brisk vascular contrast filling of the
right MCA vascular trees. Hypoplastic right A1/ACA segment with
faint opacification of the right ACA vascular tree. Luminal caliber
is smooth and tapering. No abnormally high-flow, early draining
veins are seen. No regions of abnormal hypervascularity are noted.
The visualized dural sinuses are patent.

Left CCA angiograms: Cervical angiograms show atherosclerotic
changes of the carotid bulb without significant stenoses.

Left ICA angiograms: There is a 3 mm saccular aneurysm projecting
superiorly from the left ICA terminus. There is brisk vascular
contrast filling of the bilateral ACA and left MCA vascular trees.
Luminal irregularity seen of the cavernous segment of the left ICA,
consistent atherosclerotic disease without significant stenosis. No
abnormally high-flow, early draining veins are seen. No regions of
abnormal hypervascularity are noted. The visualized dural sinuses
are patent.

PROCEDURE:
No intervention performed.
IMPRESSION: 1. A 3 mm superiorly projecting saccular aneurysm at the left ICA
terminus.
2. A 2.7 mm superiorly projecting paraophthalmic right ICA aneurysm.
3. A 2 mm aneurysm versus infundibulum at the origin of the right
ophthalmic artery.
4. Mild atherosclerotic disease at the bilateral carotid
bifurcations without hemodynamically significant stenosis.

PLAN:
Patient will return for office consultation to discuss angiogram
findings.

## 2021-06-18 MED ORDER — VERAPAMIL HCL 2.5 MG/ML IV SOLN
INTRAVENOUS | Status: AC
  Filled 2021-06-18: qty 2

## 2021-06-18 MED ORDER — SODIUM CHLORIDE 0.9 % IV SOLN: Freq: Once | INTRAVENOUS | Status: AC

## 2021-06-18 MED ORDER — MIDAZOLAM HCL 2 MG/2ML IJ SOLN
INTRAMUSCULAR | Status: AC
  Filled 2021-06-18: qty 2

## 2021-06-18 MED ORDER — NITROGLYCERIN 1 MG/10 ML FOR IR/CATH LAB
INTRA_ARTERIAL | Status: AC
  Filled 2021-06-18: qty 10

## 2021-06-18 MED ORDER — MIDAZOLAM HCL 2 MG/2ML IJ SOLN
INTRAMUSCULAR | Status: DC | PRN
  Administered 2021-06-18: 1 mg via INTRAVENOUS

## 2021-06-18 MED ORDER — HEPARIN SODIUM (PORCINE) 1000 UNIT/ML IJ SOLN
INTRAMUSCULAR | Status: AC
  Filled 2021-06-18: qty 10

## 2021-06-18 MED ORDER — LIDOCAINE HCL 1 % IJ SOLN
INTRAMUSCULAR | Status: AC
  Filled 2021-06-18: qty 20

## 2021-06-18 MED ORDER — IOHEXOL 300 MG/ML  SOLN
100.0000 mL | Freq: Once | INTRAMUSCULAR | Status: AC | PRN
  Administered 2021-06-18: 55 mL via INTRA_ARTERIAL

## 2021-06-18 MED ORDER — FENTANYL CITRATE (PF) 100 MCG/2ML IJ SOLN
INTRAMUSCULAR | Status: AC
  Filled 2021-06-18: qty 2

## 2021-06-18 MED ORDER — FENTANYL CITRATE (PF) 100 MCG/2ML IJ SOLN
INTRAMUSCULAR | Status: DC | PRN
  Administered 2021-06-18: 25 ug via INTRAVENOUS

## 2021-06-18 MED ORDER — NITROGLYCERIN 1 MG/10 ML FOR IR/CATH LAB: INTRA_ARTERIAL | Status: DC | PRN

## 2021-06-18 NOTE — Discharge Instructions (Signed)
Radial Site Care  This sheet gives you information about how to care for yourself after your procedure. Your health care provider may also give you more specific instructions. If you have problems or questions, contact your health care provider. What can I expect after the procedure? After the procedure, it is common to have: Bruising and tenderness at the catheter insertion area. Follow these instructions at home: Medicines Take over-the-counter and prescription medicines only as told by your health care provider. Insertion site care Follow instructions from your health care provider about how to take care of your insertion site. Make sure you: Wash your hands with soap and water before you remove your bandage (dressing). If soap and water are not available, use hand sanitizer. May remove dressing in 24 hours. Check your insertion site every day for signs of infection. Check for: Redness, swelling, or pain. Fluid or blood. Pus or a bad smell. Warmth. Do no take baths, swim, or use a hot tub for 5 days. You may shower 24-48 hours after the procedure. Remove the dressing and gently wash the site with plain soap and water. Pat the area dry with a clean towel. Do not rub the site. That could cause bleeding. Do not apply powder or lotion to the site. Activity  For 24 hours after the procedure, or as directed by your health care provider: Do not flex or bend the affected arm. Do not push or pull heavy objects with the affected arm. Do not drive yourself home from the hospital or clinic. You may drive 24 hours after the procedure. Do not operate machinery or power tools. KEEP ARM ELEVATED THE REMAINDER OF THE DAY. Do not push, pull or lift anything that is heavier than 10 lb for 5 days. Ask your health care provider when it is okay to: Return to work or school. Resume usual physical activities or sports. Resume sexual activity. General instructions If the catheter site starts to  bleed, raise your arm and put firm pressure on the site. If the bleeding does not stop, get help right away. This is a medical emergency. DRINK PLENTY OF FLUIDS FOR THE NEXT 2-3 DAYS. No alcohol consumption for 24 hours after receiving sedation. If you went home on the same day as your procedure, a responsible adult should be with you for the first 24 hours after you arrive home. Keep all follow-up visits as told by your health care provider. This is important. Contact a health care provider if: You have a fever. You have redness, swelling, or yellow drainage around your insertion site. Get help right away if: You have unusual pain at the radial site. The catheter insertion area swells very fast. The insertion area is bleeding, and the bleeding does not stop when you hold steady pressure on the area. Your arm or hand becomes pale, cool, tingly, or numb. These symptoms may represent a serious problem that is an emergency. Do not wait to see if the symptoms will go away. Get medical help right away. Call your local emergency services (911 in the [place] Do not drive yourself to the hospital. Summary After the procedure, it is common to have bruising and tenderness at the site. Follow instructions from your health care provider about how to take care of your radial site wound. Check the wound every day for signs of infection.  This information is not intended to replace advice given to you by your health care provider. Make sure you discuss any questions you have with   your health care provider. Document Revised: 07/27/2017 Document Reviewed: 07/27/2017 Elsevier Patient Education  2020 [employer]  

## 2021-06-18 NOTE — H&P (Signed)
Chief Complaint: Patient was seen in consultation today for cerebral aneurysm.  Supervising Physician: Pedro Earls  Patient Status: [facility] - Out-pt  History of Present Illness: Shannon Oliver is a 60 y.o. female with past medical history significant for diabetes, hyperlipidemia, hypertension and cataract.  She presented to the emergency room with headache on 06/05/2021.  She reports headache initially mild but progressively worsened with associated nausea.  She had undergone cataract surgery the day before, without any reported complication.  Head CT was performed and showed no acute intracranial abnormality.  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  Due to severe headache in the setting of intracranial aneurysm, a lumbar puncture was performed to exclude subarachnoid hemorrhage but was negative.  She has returned to the ER since then with continued recurrent episodes of headache. She states the headache usually has gradual onset, mostly in the occipital region with associated nausea. No focal neurological deficits are associated.  She endorses blurred vision bilaterally.  She says this is customary for her right eye and associates the blurred vision of there left eye with her cataract surgery.  She does not have a headache today.  No dizziness, lightheadedness or nausea.  She otherwise has no other complaints today.  Past Medical History:  Diagnosis Date   Abnormal EKG    LVH with strain   Acid reflux    Depression    Diabetes mellitus    A1C over 9   HLD (hyperlipidemia)    Hypertension    Noncompliance    Obesity     Past Surgical History:  Procedure Laterality Date   CATARACT EXTRACTION Left 06/04/2021   COLONOSCOPY WITH PROPOFOL N/A 04/29/2015   Procedure: COLONOSCOPY WITH PROPOFOL;  Surgeon: Garlan Fair, MD;  Location: [facility];  Service: Endoscopy;  Laterality: N/A;    HYSTEROSCOPY WITH D & C N/A 09/07/2017   Procedure: DILATATION AND CURETTAGE /HYSTEROSCOPY;  Surgeon: Sloan Leiter, MD;  Location: [place];  Service: Gynecology;  Laterality: N/A;   KNEE ARTHROSCOPY W/ MENISCAL REPAIR Right     Allergies: Canagliflozin and Hydrocodone  Medications: Prior to Admission medications   Medication Sig Start Date End Date Taking? Authorizing Provider  amLODipine (NORVASC) 10 MG tablet TAKE 1 TABLET EVERY DAY ONCE A DAY ORALLY 90 03/13/18  Yes Elayne Snare, MD  carvedilol (COREG) 25 MG tablet Take 25 mg by mouth 2 (two) times daily with a meal. 04/17/21 07/16/21 Yes [provider]  FARXIGA 5 MG TABS tablet TAKE 1 TABLET (5 MG TOTAL) BY MOUTH DAILY. 06/15/21  Yes Elayne Snare, MD  losartan (COZAAR) 100 MG tablet Take 100 mg by mouth daily. 04/17/21  Yes [provider]  metFORMIN (GLUCOPHAGE) 1000 MG tablet Take 1,000 mg by mouth daily. 03/18/20  Yes [provider]  Multiple Vitamins-Calcium (ONE-A-DAY WOMENS PO) Take 1 tablet by mouth daily.   Yes [provider]  NOVOLIN 70/30 FLEXPEN (70-30) 100 UNIT/ML KwikPen Inject 16-30 Units into the skin 2 (two) times daily as needed (high blood sugar). 03/18/20  Yes [provider]  rosuvastatin (CRESTOR) 40 MG tablet Take 40 mg by mouth daily. 04/27/21  Yes [provider]  spironolactone (ALDACTONE) 25 MG tablet Take 1 tablet by mouth daily. 04/17/21 07/16/21 Yes [provider]  Accu-Chek Softclix Lancets lancets Use to check blood sugar twice daily. 04/27/21   Elayne Snare, MD  clotrimazole (  LOTRIMIN) 1 % cream Apply 1 application topically in the morning and at bedtime. Patient not taking: Reported on 04/27/2021 10/29/20   [provider]  Continuous Blood Gluc Sensor (DEXCOM G6 SENSOR) MISC Use to monitor blood sugar, change after 10 days 03/30/21   Elayne Snare, MD  glucose blood (ACCU-CHEK GUIDE) test strip Use as instructed to check  blood sugar twice daily. 04/27/21   Elayne Snare, MD  Insulin Syringe-Needle U-100 30G X 1/2" 0.3 ML MISC Use twice a day with insulin 10/17/17   Elayne Snare, MD  oxyCODONE-acetaminophen (PERCOCET/ROXICET) 5-325 MG tablet Take 1 tablet by mouth every 6 (six) hours as needed for up to 5 doses for severe pain. Patient not taking: Reported on 06/09/2021 04/25/21   Luna Fuse, MD  potassium chloride SA (KLOR-CON M20) 20 MEQ tablet Take 1 tablet (20 mEq total) by mouth daily. Patient not taking: Reported on 04/27/2021 03/30/21   Elayne Snare, MD  tiZANidine (ZANAFLEX) 2 MG tablet Take 2 mg by mouth every 12 (twelve) hours as needed for muscle pain. 02/22/20   [provider]     Family History  Problem Relation Age of Onset   Cancer Mother    Hypertension Mother    Diabetes Mother    Breast cancer Maternal Aunt    Breast cancer Cousin     Social History   Socioeconomic History   Marital status: Married    Spouse name: Not on file   Number of children: Not on file   Years of education: Not on file   Highest education level: Not on file  Occupational History   Not on file  Tobacco Use   Smoking status: Never   Smokeless tobacco: Never  Vaping Use   Vaping Use: Never used  Substance and Sexual Activity   Alcohol use: Not Currently    Comment: occ   Drug use: No   Sexual activity: Not on file  Other Topics Concern   Not on file  Social History Narrative   ** Merged History Encounter **       Social Determinants of Health   Financial Resource Strain: Not on file  Food Insecurity: Not on file  Transportation Needs: Not on file  Physical Activity: Not on file  Stress: Not on file  Social Connections: Not on file    Review of Systems: A 12 point ROS discussed and pertinent positives are indicated in the HPI above.  All other systems are negative.  Vital Signs: BP (!) 151/83    Pulse 69    Temp 98.1 F (36.7 C) (Oral)    Resp 12    Ht 5\' 6"  (1.676 m)    Wt 231 lb  (104.8 kg)    LMP 08/02/2013 Comment: spotting   SpO2 100%    BMI 37.28 kg/m   Physical Exam Constitutional:      General: She is not in acute distress. HENT:     Head: Normocephalic and atraumatic.     Nose: Nose normal.     Mouth/Throat:     Mouth: Mucous membranes are moist.     Pharynx: Oropharynx is clear.  Eyes:     Extraocular Movements: Extraocular movements intact.  Cardiovascular:     Rate and Rhythm: Normal rate and regular rhythm.     Pulses: Normal pulses.     Heart sounds: Normal heart sounds.  Pulmonary:     Effort: Pulmonary effort is normal.     Breath sounds: Normal breath  sounds.  Abdominal:     General: Bowel sounds are normal.     Palpations: Abdomen is soft.  Musculoskeletal:     Cervical back: Neck supple.  Skin:    General: Skin is warm and dry.     Capillary Refill: Capillary refill takes less than 2 seconds.  Neurological:     General: No focal deficit present.     Mental Status: She is alert and oriented to person, place, and time.     Cranial Nerves: Cranial nerves 2-12 are intact.     Sensory: No sensory deficit.  Psychiatric:        Mood and Affect: Mood normal.    Imaging: CT Angio Head W or Wo Contrast  Result Date: 06/05/2021 CLINICAL DATA:  Neuro deficit, acute, stroke suspected. Additional history provided: Severe headache, left-sided, concern for aneurysm, stroke, subarachnoid hemorrhage. EXAM: CT ANGIOGRAPHY HEAD TECHNIQUE: Multidetector CT imaging of the head was performed using the standard protocol during bolus administration of intravenous contrast. Multiplanar CT image reconstructions and MIPs were obtained to evaluate the vascular anatomy. CONTRAST:  12mL OMNIPAQUE IOHEXOL 350 MG/ML SOLN COMPARISON:  Head CT 03/23/2020. FINDINGS: CT HEAD Brain: Cerebral volume is normal for age. Patchy and ill-defined hypoattenuation within the cerebral white matter, nonspecific but most often secondary to chronic small vessel ischemia. There is no  acute intracranial hemorrhage. No demarcated cortical infarct. No extra-axial fluid collection. No evidence of an intracranial mass. No midline shift. Vascular: No hyperdense vessel. Skull: Normal. Negative for fracture or focal lesion. Orbits: No acute or significant orbital finding. Sinuses: No significant paranasal sinus disease at the imaged levels. CTA HEAD Anterior circulation: The intracranial internal carotid arteries are patent. The M1 middle cerebral arteries are patent. No M2 proximal branch occlusion or high-grade proximal stenosis is identified. The anterior cerebral arteries are patent. The right A1 segment is hypoplastic. 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 Posterior circulation: The intracranial vertebral arteries are patent. The basilar artery is patent. The posterior cerebral arteries are patent. Posterior communicating arteries are present bilaterally. Venous sinuses: Within the limitations of contrast timing, no convincing thrombus. Anatomic variants: As described. Review of the MIP images confirms the above findings. IMPRESSION: CT head: 1. No evidence of acute intracranial hemorrhage or acute infarction. 2. Patchy and ill-defined hypoattenuation within the cerebral white matter, nonspecific but most often secondary to chronic small vessel ischemia. CTA head: 1. 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 2. 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 3. No intracranial large vessel occlusion or proximal high-grade arterial stenosis. Electronically Signed   By: Kellie Simmering D.O.   On: 06/05/2021 09:23   CT HEAD WO  CONTRAST (5MM)  Result Date: 06/08/2021 CLINICAL DATA:  Headache, intracranial hemorrhage suspected. Sudden worsening. History of cerebral aneurysms. EXAM: CT HEAD WITHOUT CONTRAST TECHNIQUE: Contiguous axial images were obtained from the base of the skull through the vertex without intravenous contrast. COMPARISON:  06/05/2021 FINDINGS: Brain: No sign of acute infarction. There chronic small-vessel ischemic changes of the cerebral hemispheric white matter. No mass lesion, hemorrhage, hydrocephalus or extra-axial collection. Vascular: No abnormal vascular finding by CT. Skull: Normal Sinuses/Orbits: Clear/normal  Other: None IMPRESSION: No acute CT finding. Chronic small-vessel ischemic changes of the cerebral hemispheric white matter. No visible intracranial hemorrhage. Electronically Signed   By: Nelson Chimes M.D.   On: 06/08/2021 09:12    Labs:  CBC: Recent Labs    10/29/20 1335 06/05/21 0720 06/08/21 0829 06/18/21 0950  WBC 5.6 5.0 5.5 5.2  HGB 12.9 11.4* 12.7 12.4  HCT 39.1 35.7* 40.7 38.8  PLT 225 196 231 251    COAGS: Recent Labs    06/18/21 0950  INR 1.1    BMP: Recent Labs    10/30/20 0135 03/27/21 1400 04/22/21 1405 06/05/21 0720 06/08/21 0829 06/18/21 0950  NA 138   < > 140 139 137 139  K 3.5   < > 3.4* 3.8 3.8 3.9  CL 100   < > 106 105 105 106  CO2 28   < > 24 26 25 27   GLUCOSE 262*   < > 246* 144* 160* 136*  BUN 15   < > 23 17 19 14   CALCIUM 9.6   < > 9.4 9.7 9.3 9.6  CREATININE 0.85   < > 0.96 0.82 0.82 0.88  GFRNONAA >60  --   --  >60 >60 >60   < > = values in this interval not displayed.    LIVER FUNCTION TESTS: Recent Labs    09/05/20 1522 06/08/21 0829  BILITOT 0.4 0.4  AST 21 19  ALT 20 19  ALKPHOS 80 49  PROT 7.6 7.6  ALBUMIN 4.0 3.9     Assessment and Plan:  Ms. Marinell Igarashi, [phone] female, presents today due to her history of headaches associated with findings of aneurysm on CTA.  She is scheduled for a diagnostic angiogram today.   Planning to proceed.  She is NPO and agreeable.  Vitals, imaging, history, and labs have been reviewed.  Plan for d/c home later today.  Risks and benefits of cerebral angiogram were discussed with the patient including, but not limited to bleeding, infection, vascular injury or contrast induced renal failure.  This interventional procedure involves the use of X-rays and because of the nature of the planned procedure, it is possible that we will have prolonged use of X-ray fluoroscopy.  Potential radiation risks to you include (but are not limited to) the following: - A slightly elevated risk for cancer  several years later in life. This risk is typically less than 0.5% percent. This risk is low in comparison to the normal incidence of human cancer, which is 33% for women and 50% for men according to the [place]. - Radiation induced injury can include skin redness, resembling a rash, tissue breakdown / ulcers and hair loss (which can be temporary or permanent).   The likelihood of either of these occurring depends on the difficulty of the procedure and whether you are sensitive to radiation due to previous procedures, disease, or genetic conditions.   IF your procedure requires a prolonged use of radiation, you will be notified and given written instructions for further action.  It is your responsibility to monitor the irradiated area for the 2 weeks following the procedure and to notify your physician if you are concerned that you have suffered a radiation induced injury.    All of the patient's questions were answered, patient is agreeable to proceed.  Consent signed and in chart.    Thank you for this interesting consult.  I greatly enjoyed meeting SOJOURNER BEHRINGER and look forward to participating  in their care.  A copy of this report was sent to the requesting provider on this date.  Electronically Signed: Pasty Spillers, PA 06/18/2021, 10:55 AM   I spent a total  of  25 Minutes in face to face in clinical consultation, greater than 50% of which was counseling/coordinating care for diagnostic angiogram.

## 2021-06-19 ENCOUNTER — Other Ambulatory Visit (HOSPITAL_COMMUNITY): Payer: Self-pay | Admitting: Neuroradiology

## 2021-06-19 ENCOUNTER — Encounter (HOSPITAL_COMMUNITY): Payer: Self-pay | Admitting: Radiology

## 2021-06-19 DIAGNOSIS — I671 Cerebral aneurysm, nonruptured: Secondary | ICD-10-CM | POA: Diagnosis present

## 2021-06-19 HISTORY — PX: IR RADIOLOGIST EVAL & MGMT: IMG5224

## 2021-06-24 ENCOUNTER — Other Ambulatory Visit (HOSPITAL_COMMUNITY): Payer: Self-pay | Admitting: Neuroradiology

## 2021-06-24 DIAGNOSIS — I671 Cerebral aneurysm, nonruptured: Secondary | ICD-10-CM

## 2021-06-30 ENCOUNTER — Other Ambulatory Visit: Payer: Self-pay

## 2021-06-30 ENCOUNTER — Ambulatory Visit (HOSPITAL_COMMUNITY)
Admission: RE | Admit: 2021-06-30 | Discharge: 2021-06-30 | Disposition: A | Discharge status: Home / Self Care | Payer: BC Managed Care – PPO | Source: Ambulatory Visit | Attending: Neuroradiology | Admitting: Neuroradiology

## 2021-06-30 DIAGNOSIS — I671 Cerebral aneurysm, nonruptured: Secondary | ICD-10-CM

## 2021-06-30 NOTE — Progress Notes (Deleted)
NEUROLOGY CONSULTATION NOTE  Shannon Oliver MRN: [id number] DOB: Jun 19, 1961  Referring provider: Godfrey Pick, MD *** Primary care provider: Leeroy Cha, MD  Reason for consult:  headache, cerebral aneurysm  Assessment/Plan:   ***   Subjective:  Shannon Oliver is a 60 year old female with HTN, DM 2, HLD and sleep disorder who presents for headache and cerebral aneurysm.  History supplemented by ED notes.    ***.  On 06/04/2021, she developed *** following cataract surgery.  IOPs were normal and not believed by her ophthalmologist to be secondary to the surgery.  She went to the ED where CT head was negative for evidence of bleed or other acute intracranial abnormality but CTA showed 4 mm aneurysm arising from the left ICA terminus and 3 mm aneurysm arising from the distal right ICA.  She underwent LP which was negative for evidence of bleed to suggest [place] due to sentinel bleed from one of the aneurysms.  Headaches improved but then became worse on 06/08/2021.  She went back to the ED where repeated CT head personally reviewed again showed no acute findings.  She was treated with headache cocktail with improvement.  However the headache returned and she returned to the ED the following day.  Sed rate was 25 and CRP was 0.5.  She followed up with IR and underwent cerebral angiogram on 06/18/2021 which showed 3 mm superiorly projecting saccular aneurysm oat the left ICA terminus, 2.7 mm superiorly projecting paraophthalmic right ICA aneurysm and 2 mm aneurysm vs infundibulum at origin of the right ophthalmic artery.  No evidence of ICA or VA dissections.    Current NSAIDS/analgesics:  *** Current triptans:  *** Current ergotamine:  *** Current anti-emetic:  *** Current muscle relaxants:  tizanidine Current Antihypertensive medications:  Cozaar, Coreg, amlodipine Current Antidepressant medications:  *** Current Anticonvulsant medications:  *** Current anti-CGRP:  *** Current  Vitamins/Herbal/Supplements:  *** Current Antihistamines/Decongestants:  *** Other therapy:  *** Hormone/birth control:  *** Other medications:  ***  Past NSAIDS/analgesics:  *** Past abortive triptans:  *** Past abortive ergotamine:  *** Past muscle relaxants:  *** Past anti-emetic:  *** Past antihypertensive medications:  *** Past antidepressant medications:  *** Past anticonvulsant medications:  *** Past anti-CGRP:  *** Past vitamins/Herbal/Supplements:  *** Past antihistamines/decongestants:  *** Other past therapies:  ***  Caffeine:  *** Alcohol:  *** Smoker:  *** Diet:  *** Exercise:  *** Depression:  ***; Anxiety:  *** Other pain:  *** Sleep hygiene:  *** Family history of headache:  ***      PAST MEDICAL HISTORY: Past Medical History:  Diagnosis Date   Abnormal EKG    LVH with strain   Acid reflux    Depression    Diabetes mellitus    A1C over 9   HLD (hyperlipidemia)    Hypertension    Noncompliance    Obesity     PAST SURGICAL HISTORY: Past Surgical History:  Procedure Laterality Date   CATARACT EXTRACTION Left 06/04/2021   COLONOSCOPY WITH PROPOFOL N/A 04/29/2015   Procedure: COLONOSCOPY WITH PROPOFOL;  Surgeon: Garlan Fair, MD;  Location: [facility];  Service: Endoscopy;  Laterality: N/A;   HYSTEROSCOPY WITH D & C N/A 09/07/2017   Procedure: DILATATION AND CURETTAGE /HYSTEROSCOPY;  Surgeon: Sloan Leiter, MD;  Location: [place];  Service: Gynecology;  Laterality: N/A;   IR ANGIO INTRA EXTRACRAN SEL COM CAROTID INNOMINATE UNI R MOD SED  06/18/2021   IR ANGIO INTRA  EXTRACRAN SEL INTERNAL CAROTID UNI L MOD SED  06/18/2021   IR ANGIO VERTEBRAL SEL VERTEBRAL UNI R MOD SED  06/18/2021   IR RADIOLOGIST EVAL & MGMT  06/19/2021   IR US GUIDE VASC ACCESS RIGHT  06/18/2021   KNEE ARTHROSCOPY W/ MENISCAL REPAIR Right     MEDICATIONS: Current Outpatient Medications on File Prior to Visit  Medication Sig Dispense Refill    Accu-Chek Softclix Lancets lancets Use to check blood sugar twice daily. 100 each 3   amLODipine (NORVASC) 10 MG tablet TAKE 1 TABLET EVERY DAY ONCE A DAY ORALLY 90 30 tablet 1   carvedilol (COREG) 25 MG tablet Take 25 mg by mouth 2 (two) times daily with a meal.     clotrimazole (LOTRIMIN) 1 % cream Apply 1 application topically in the morning and at bedtime. (Patient not taking: Reported on 04/27/2021)     Continuous Blood Gluc Sensor (DEXCOM G6 SENSOR) MISC Use to monitor blood sugar, change after 10 days 3 each 3   FARXIGA 5 MG TABS tablet TAKE 1 TABLET (5 MG TOTAL) BY MOUTH DAILY. 30 tablet 1   glucose blood (ACCU-CHEK GUIDE) test strip Use as instructed to check blood sugar twice daily. 100 each 12   Insulin Syringe-Needle U-100 30G X 1/2" 0.3 ML MISC Use twice a day with insulin 100 each 0   losartan (COZAAR) 100 MG tablet Take 100 mg by mouth daily.     metFORMIN (GLUCOPHAGE) 1000 MG tablet Take 1,000 mg by mouth daily.     Multiple Vitamins-Calcium (ONE-A-DAY WOMENS PO) Take 1 tablet by mouth daily.     NOVOLIN 70/30 FLEXPEN (70-30) 100 UNIT/ML KwikPen Inject 16-30 Units into the skin 2 (two) times daily as needed (high blood sugar).     oxyCODONE-acetaminophen (PERCOCET/ROXICET) 5-325 MG tablet Take 1 tablet by mouth every 6 (six) hours as needed for up to 5 doses for severe pain. (Patient not taking: Reported on 06/09/2021) 5 tablet 0   potassium chloride SA (KLOR-CON M20) 20 MEQ tablet Take 1 tablet (20 mEq total) by mouth daily. (Patient not taking: Reported on 04/27/2021) 30 tablet 1   rosuvastatin (CRESTOR) 40 MG tablet Take 40 mg by mouth daily.     spironolactone (ALDACTONE) 25 MG tablet Take 1 tablet by mouth daily.     tiZANidine (ZANAFLEX) 2 MG tablet Take 2 mg by mouth every 12 (twelve) hours as needed for muscle pain.     No current facility-administered medications on file prior to visit.    ALLERGIES: Allergies  Allergen Reactions   Canagliflozin     Other reaction(s):  dizziness, stabbing pain in right side of body   Hydrocodone Hypertension    FAMILY HISTORY: Family History  Problem Relation Age of Onset   Cancer Mother    Hypertension Mother    Diabetes Mother    Breast cancer Maternal Aunt    Breast cancer Cousin     Objective:  *** General: No acute distress.  Patient appears well-groomed.   Head:  Normocephalic/atraumatic Eyes:  fundi examined but not visualized Neck: supple, no paraspinal tenderness, full range of motion Back: No paraspinal tenderness Heart: regular rate and rhythm Lungs: Clear to auscultation bilaterally. Vascular: No carotid bruits. Neurological Exam: Mental status: alert and oriented to person, place, and time, recent and remote memory intact, fund of knowledge intact, attention and concentration intact, speech fluent and not dysarthric, language intact. Cranial nerves: CN I: not tested CN II: pupils equal, round and reactive to  light, visual fields intact CN III, IV, VI:  full range of motion, no nystagmus, no ptosis CN V: facial sensation intact. CN VII: upper and lower face symmetric CN VIII: hearing intact CN IX, X: gag intact, uvula midline CN XI: sternocleidomastoid and trapezius muscles intact CN XII: tongue midline Bulk & Tone: normal, no fasciculations. Motor:  muscle strength 5/5 throughout Sensation:  Pinprick, temperature and vibratory sensation intact. Deep Tendon Reflexes:  2+ throughout,  toes downgoing.   Finger to nose testing:  Without dysmetria.   Heel to shin:  Without dysmetria.   Gait:  Normal station and stride.  Romberg negative.    Thank you for allowing me to take part in the care of this patient.  Shannon Clines, DO  CC:  Leeroy Cha, MD

## 2021-06-30 NOTE — Progress Notes (Signed)
Referring Physician(s): de Macedo Selden  Chief Complaint: The patient is seen in follow up today s/p diagnostic cerebral angiogrm.  History of present illness:  Shannon Oliver is a 59 year old female with past medical history significant for diabetes, hyperlipidemia, hypertension and cataract.  She presented to the emergency room with headache on 06/05/2021.  She reports headache initial in mild comment progressively worsened with associated nausea.  She had undergone cataract surgery the day before, without any reported complication.  Head CT was performed and showed no acute intracranial abnormality.  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  Due to severe headache in the setting of intracranial aneurysm, a lumbar puncture was performed to exclude subarachnoid hemorrhage but was negative.  He has returned to the ER since then with continued recurrent episodes of headache. She states the headache usually has gradual onset, mostly in the occipital region with associated nausea. No focal neurological deficits are associated.  She underwent a diagnostic cerebral angiogram on 06/19/2021 that confirmed the presence of 2 brain aneurysms. One of the aneurysms is at the left carotid terminus, measuring 3 mm with a small pseudolobulation. The second aneurysm is at the ophthalmic segment of the right ICA and measures 2.75 mm. She recovered well from the angiogram with only minor discomfort at the puncture site.  Past Medical History:  Diagnosis Date   Abnormal EKG    LVH with strain   Acid reflux    Depression    Diabetes mellitus    A1C over 9   HLD (hyperlipidemia)    Hypertension    Noncompliance    Obesity     Past Surgical History:  Procedure Laterality Date   CATARACT EXTRACTION Left 06/04/2021   COLONOSCOPY WITH PROPOFOL N/A 04/29/2015   Procedure: COLONOSCOPY WITH PROPOFOL;  Surgeon: Garlan Fair, MD;  Location: [facility];  Service: Endoscopy;  Laterality: N/A;   HYSTEROSCOPY WITH D & C N/A 09/07/2017   Procedure: DILATATION AND CURETTAGE /HYSTEROSCOPY;  Surgeon: Sloan Leiter, MD;  Location: [place];  Service: Gynecology;  Laterality: N/A;   IR ANGIO INTRA EXTRACRAN SEL COM CAROTID INNOMINATE UNI R MOD SED  06/18/2021   IR ANGIO INTRA EXTRACRAN SEL INTERNAL CAROTID UNI L MOD SED  06/18/2021   IR ANGIO VERTEBRAL SEL VERTEBRAL UNI R MOD SED  06/18/2021   IR RADIOLOGIST EVAL & MGMT  06/19/2021   IR US GUIDE VASC ACCESS RIGHT  06/18/2021   KNEE ARTHROSCOPY W/ MENISCAL REPAIR Right     Allergies: Canagliflozin and Hydrocodone  Medications: Prior to Admission medications   Medication Sig Start Date End Date Taking? Authorizing Provider  Accu-Chek Softclix Lancets lancets Use to check blood sugar twice daily. 04/27/21   Elayne Snare, MD  amLODipine (NORVASC) 10 MG tablet TAKE 1 TABLET EVERY DAY ONCE A DAY ORALLY 90 03/13/18   Elayne Snare, MD  carvedilol (COREG) 25 MG tablet Take 25 mg by mouth 2 (two) times daily with a meal. 04/17/21 07/16/21  [provider]  clotrimazole (LOTRIMIN) 1 % cream Apply 1 application topically in the morning and at bedtime. Patient not taking: Reported on 04/27/2021 10/29/20   [provider]  Continuous Blood Gluc Sensor (DEXCOM G6 SENSOR) MISC Use to monitor blood sugar, change after 10 days 03/30/21   Elayne Snare, MD  FARXIGA 5 MG TABS tablet TAKE 1 TABLET (5 MG TOTAL) BY MOUTH DAILY.  06/15/21   Elayne Snare, MD  glucose blood (ACCU-CHEK GUIDE) test strip Use as instructed to check blood sugar twice daily. 04/27/21   Elayne Snare, MD  Insulin Syringe-Needle U-100 30G X 1/2" 0.3 ML MISC Use twice a day with insulin 10/17/17   Elayne Snare, MD  losartan (COZAAR) 100 MG tablet Take 100 mg by mouth daily. 04/17/21   [provider]  metFORMIN (GLUCOPHAGE) 1000 MG tablet Take 1,000 mg by mouth daily. 03/18/20    [provider]  Multiple Vitamins-Calcium (ONE-A-DAY WOMENS PO) Take 1 tablet by mouth daily.    [provider]  NOVOLIN 70/30 FLEXPEN (70-30) 100 UNIT/ML KwikPen Inject 16-30 Units into the skin 2 (two) times daily as needed (high blood sugar). 03/18/20   [provider]  oxyCODONE-acetaminophen (PERCOCET/ROXICET) 5-325 MG tablet Take 1 tablet by mouth every 6 (six) hours as needed for up to 5 doses for severe pain. Patient not taking: Reported on 06/09/2021 04/25/21   Luna Fuse, MD  potassium chloride SA (KLOR-CON M20) 20 MEQ tablet Take 1 tablet (20 mEq total) by mouth daily. Patient not taking: Reported on 04/27/2021 03/30/21   Elayne Snare, MD  rosuvastatin (CRESTOR) 40 MG tablet Take 40 mg by mouth daily. 04/27/21   [provider]  spironolactone (ALDACTONE) 25 MG tablet Take 1 tablet by mouth daily. 04/17/21 07/16/21  [provider]  tiZANidine (ZANAFLEX) 2 MG tablet Take 2 mg by mouth every 12 (twelve) hours as needed for muscle pain. 02/22/20   [provider]     Family History  Problem Relation Age of Onset   Cancer Mother    Hypertension Mother    Diabetes Mother    Breast cancer Maternal Aunt    Breast cancer Cousin     Social History   Socioeconomic History   Marital status: Married    Spouse name: Not on file   Number of children: Not on file   Years of education: Not on file   Highest education level: Not on file  Occupational History   Not on file  Tobacco Use   Smoking status: Never   Smokeless tobacco: Never  Vaping Use   Vaping Use: Never used  Substance and Sexual Activity   Alcohol use: Not Currently    Comment: occ   Drug use: No   Sexual activity: Not on file  Other Topics Concern   Not on file  Social History Narrative   ** Merged History Encounter **       Social Determinants of Health   Financial Resource Strain: Not on file  Food Insecurity: Not on file  Transportation Needs: Not on  file  Physical Activity: Not on file  Stress: Not on file  Social Connections: Not on file     Vital Signs: LMP 08/02/2013 Comment: spotting  Physical Exam  Imaging: Diagnostic cerebral angiogram 06/18/2021: IMPRESSION: 1. A 3 mm superiorly projecting saccular aneurysm at the left ICA terminus. 2. 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 3. A 2 mm aneurysm versus infundibulum at the origin of the right ophthalmic artery. 4. Mild atherosclerotic disease at the bilateral carotid bifurcations without hemodynamically significant stenosis. Electronically Signed   By: Pedro Earls M.D.   On: 06/19/2021 09:33    Labs:  CBC: Recent Labs    10/29/20 1335 06/05/21 0720 06/08/21 0829 06/18/21 0950  WBC 5.6 5.0 5.5 5.2  HGB 12.9 11.4* 12.7 12.4  HCT 39.1 35.7* 40.7 38.8  PLT 225 196 231 251    COAGS: Recent Labs    06/18/21 0950  INR 1.1    BMP: Recent Labs    10/30/20 0135 03/27/21 1400 04/22/21 1405 06/05/21 0720 06/08/21 0829 06/18/21 0950  NA 138   < > 140 139 137 139  K 3.5   < > 3.4* 3.8 3.8 3.9  CL 100   < > 106 105 105 106  CO2 28   < > 24 26 25 27   GLUCOSE 262*   < > 246* 144* 160* 136*  BUN 15   < > 23 17 19 14   CALCIUM 9.6   < > 9.4 9.7 9.3 9.6  CREATININE 0.85   < > 0.96 0.82 0.82 0.88  GFRNONAA >60  --   --  >60 >60 >60   < > = values in this interval not displayed.    LIVER FUNCTION TESTS: Recent Labs    09/05/20 1522 06/08/21 0829  BILITOT 0.4 0.4  AST 21 19  ALT 20 19  ALKPHOS 80 49  PROT 7.6 7.6  ALBUMIN 4.0 3.9    Assessment:  I discussed the findings of the angiogram with Shannon Oliver and explained the risks and benefits of treatment versus observation. All questions were answered to her satisfaction. She has elected to have her left ICA terminus aneurysm treated endovascularly and the right ICA aneurysm to be observed given it's more smooth contour. I explained she will need to be  started on dual anti-platelet therapy prior to intervention. We will arrange to have her procedure scheduled under general anesthesia.   Signed: Pedro Earls, MD 06/30/2021, 4:55 PM    I spent a total of    15 Minutes in face to face in clinical consultation, greater than 50% of which was counseling/coordinating care for brain aneurysms.

## 2021-07-01 ENCOUNTER — Ambulatory Visit: Payer: BC Managed Care – PPO | Admitting: Neurology

## 2021-07-01 ENCOUNTER — Other Ambulatory Visit (HOSPITAL_COMMUNITY): Payer: Self-pay | Admitting: Neuroradiology

## 2021-07-01 DIAGNOSIS — I671 Cerebral aneurysm, nonruptured: Secondary | ICD-10-CM

## 2021-07-02 ENCOUNTER — Encounter (HOSPITAL_BASED_OUTPATIENT_CLINIC_OR_DEPARTMENT_OTHER): Payer: Self-pay | Admitting: Emergency Medicine

## 2021-07-02 ENCOUNTER — Other Ambulatory Visit: Payer: Self-pay

## 2021-07-02 ENCOUNTER — Emergency Department (HOSPITAL_BASED_OUTPATIENT_CLINIC_OR_DEPARTMENT_OTHER)
Admission: EM | Admit: 2021-07-02 | Discharge: 2021-07-02 | Disposition: A | Discharge status: Home / Self Care | Payer: BC Managed Care – PPO | Attending: Emergency Medicine | Admitting: Emergency Medicine

## 2021-07-02 DIAGNOSIS — Z7984 Long term (current) use of oral hypoglycemic drugs: Secondary | ICD-10-CM | POA: Diagnosis not present

## 2021-07-02 DIAGNOSIS — R519 Headache, unspecified: Secondary | ICD-10-CM

## 2021-07-02 DIAGNOSIS — J069 Acute upper respiratory infection, unspecified: Secondary | ICD-10-CM

## 2021-07-02 DIAGNOSIS — Z20822 Contact with and (suspected) exposure to covid-19: Secondary | ICD-10-CM | POA: Insufficient documentation

## 2021-07-02 DIAGNOSIS — I10 Essential (primary) hypertension: Secondary | ICD-10-CM | POA: Insufficient documentation

## 2021-07-02 DIAGNOSIS — Z79899 Other long term (current) drug therapy: Secondary | ICD-10-CM | POA: Insufficient documentation

## 2021-07-02 DIAGNOSIS — Z794 Long term (current) use of insulin: Secondary | ICD-10-CM | POA: Insufficient documentation

## 2021-07-02 DIAGNOSIS — E119 Type 2 diabetes mellitus without complications: Secondary | ICD-10-CM | POA: Diagnosis not present

## 2021-07-02 LAB — RESP PANEL BY RT-PCR (FLU A&B, COVID) ARPGX2
Influenza A by PCR: NEGATIVE
Influenza B by PCR: NEGATIVE
SARS Coronavirus 2 by RT PCR: NEGATIVE

## 2021-07-02 MED ORDER — KETOROLAC TROMETHAMINE 30 MG/ML IJ SOLN
15.0000 mg | Freq: Once | INTRAMUSCULAR | Status: AC
  Administered 2021-07-02: 08:00:00 15 mg via INTRAVENOUS
  Filled 2021-07-02: qty 1

## 2021-07-02 MED ORDER — PROCHLORPERAZINE EDISYLATE 10 MG/2ML IJ SOLN
10.0000 mg | Freq: Once | INTRAMUSCULAR | Status: AC
  Administered 2021-07-02: 08:00:00 10 mg via INTRAVENOUS
  Filled 2021-07-02: qty 2

## 2021-07-02 MED ORDER — BENZONATATE 100 MG PO CAPS: 100.0000 mg | ORAL_CAPSULE | Freq: Three times a day (TID) | ORAL | 0 refills | Status: DC

## 2021-07-02 NOTE — Discharge Instructions (Signed)
Take the medications to help with your cough.  You can also take over-the-counter throat lozenges such as Cepacol to help with your sore throat.  Follow-up with your eye doctor if the symptoms persist.  Return to the ER for worsening symptoms

## 2021-07-02 NOTE — ED Triage Notes (Signed)
°  Patient comes in with headache that is behind her left eye.  Patient states she had a cataract removed a few weeks ago and it is still very painful.  Patient also endorses productive cough, and chills.  Patient states cough makes her headache worse.  No fevers at home that she is aware of.  Pain 7/10, sharp pain.

## 2021-07-02 NOTE — ED Provider Notes (Signed)
[facility] EMERGENCY DEPT Provider Note   CSN: [id number] Arrival date & time: 07/02/21  0645     History Chief Complaint  Patient presents with   Headache   Cough    Shannon Oliver is a 60 y.o. female.   Headache Associated symptoms: cough   Cough Associated symptoms: headaches    Patient presents to the ED with complaints of headache.  Patient states she is having a headache primarily behind her left eye although she is having photophobia involving both sides.  Patient has been having issues with recurrent headaches especially over the last month.  She did end up having a CT angiogram at some point that showed left ICA aneurysm and a distal right ICA aneurysm.  No evidence of rupture.  Patient has followed up with Dr. Norma Fredrickson as and the plan is for coiling.  Patient states she also had eye surgery earlier this month.  Patient states she has had some persistent eye pain.  She does feel that the eye is somewhat tender and it hurts her to open her eye.  Patient states she saw her eye doctor yesterday though and was told it was mildly inflamed but otherwise looking okay.  Patient states she started having issues with cough and congestion.  Whenever she coughs it is increasing the pain around her eye and hand.  She has been bringing up yellow mucus. Past Medical History:  Diagnosis Date   Abnormal EKG    LVH with strain   Acid reflux    Depression    Diabetes mellitus    A1C over 9   HLD (hyperlipidemia)    Hypertension    Noncompliance    Obesity     Patient Active Problem List   Diagnosis Date Noted   Hyperlipidemia 04/30/2021   BMI 39.0-39.9,adult 04/17/2021   Elevated troponin 10/30/2020   AKI (acute kidney injury) ([place]) 10/30/2020   Palpitations    Chest pain 10/29/2020   Encounter for orthopedic follow-up care 08/19/2020   Pain in joint of left shoulder 05/14/2020   Hypertensive left ventricular hypertrophy, without heart failure 12/18/2018    Obesity due to excess calories without serious comorbidity 12/18/2018   Type 2 diabetes mellitus without complication, without long-term current use of insulin ([place]) 12/15/2018   Postmenopausal bleeding 06/05/2017   Neck pain 03/01/2014   Edema 01/25/2013   DM (diabetes mellitus) ([place]) 01/25/2013   Dyspnea 01/25/2013   Abnormal ECG 01/25/2013   Hypertension    Acid reflux     Past Surgical History:  Procedure Laterality Date   CATARACT EXTRACTION Left 06/04/2021   COLONOSCOPY WITH PROPOFOL N/A 04/29/2015   Procedure: COLONOSCOPY WITH PROPOFOL;  Surgeon: Garlan Fair, MD;  Location: [facility];  Service: Endoscopy;  Laterality: N/A;   HYSTEROSCOPY WITH D & C N/A 09/07/2017   Procedure: DILATATION AND CURETTAGE /HYSTEROSCOPY;  Surgeon: Sloan Leiter, MD;  Location: [facility];  Service: Gynecology;  Laterality: N/A;   IR ANGIO INTRA EXTRACRAN SEL COM CAROTID INNOMINATE UNI R MOD SED  06/18/2021   IR ANGIO INTRA EXTRACRAN SEL INTERNAL CAROTID UNI L MOD SED  06/18/2021   IR ANGIO VERTEBRAL SEL VERTEBRAL UNI R MOD SED  06/18/2021   IR RADIOLOGIST EVAL & MGMT  06/19/2021   IR US GUIDE VASC ACCESS RIGHT  06/18/2021   KNEE ARTHROSCOPY W/ MENISCAL REPAIR Right      OB History     Gravida  3   Para  3  Term  3   Preterm  0   AB  0   Living  3      SAB  0   IAB  0   Ectopic  0   Multiple      Live Births  3           Family History  Problem Relation Age of Onset   Cancer Mother    Hypertension Mother    Diabetes Mother    Breast cancer Maternal Aunt    Breast cancer Cousin     Social History   Tobacco Use   Smoking status: Never   Smokeless tobacco: Never  Vaping Use   Vaping Use: Never used  Substance Use Topics   Alcohol use: Not Currently    Comment: occ   Drug use: No    Home Medications Prior to Admission medications   Medication Sig Start Date End Date Taking? Authorizing Provider  benzonatate (TESSALON) 100 MG  capsule Take 1 capsule (100 mg total) by mouth every 8 (eight) hours. 07/02/21  Yes Dorie Rank, MD  Accu-Chek Softclix Lancets lancets Use to check blood sugar twice daily. 04/27/21   Elayne Snare, MD  amLODipine (NORVASC) 10 MG tablet TAKE 1 TABLET EVERY DAY ONCE A DAY ORALLY 90 03/13/18   Elayne Snare, MD  carvedilol (COREG) 25 MG tablet Take 25 mg by mouth 2 (two) times daily with a meal. 04/17/21 07/16/21  [provider]  clotrimazole (LOTRIMIN) 1 % cream Apply 1 application topically in the morning and at bedtime. Patient not taking: Reported on 04/27/2021 10/29/20   [provider]  Continuous Blood Gluc Sensor (DEXCOM G6 SENSOR) MISC Use to monitor blood sugar, change after 10 days 03/30/21   Elayne Snare, MD  FARXIGA 5 MG TABS tablet TAKE 1 TABLET (5 MG TOTAL) BY MOUTH DAILY. 06/15/21   Elayne Snare, MD  glucose blood (ACCU-CHEK GUIDE) test strip Use as instructed to check blood sugar twice daily. 04/27/21   Elayne Snare, MD  Insulin Syringe-Needle U-100 30G X 1/2" 0.3 ML MISC Use twice a day with insulin 10/17/17   Elayne Snare, MD  losartan (COZAAR) 100 MG tablet Take 100 mg by mouth daily. 04/17/21   [provider]  metFORMIN (GLUCOPHAGE) 1000 MG tablet Take 1,000 mg by mouth daily. 03/18/20   [provider]  Multiple Vitamins-Calcium (ONE-A-DAY WOMENS PO) Take 1 tablet by mouth daily.    [provider]  NOVOLIN 70/30 FLEXPEN (70-30) 100 UNIT/ML KwikPen Inject 16-30 Units into the skin 2 (two) times daily as needed (high blood sugar). 03/18/20   [provider]  oxyCODONE-acetaminophen (PERCOCET/ROXICET) 5-325 MG tablet Take 1 tablet by mouth every 6 (six) hours as needed for up to 5 doses for severe pain. Patient not taking: Reported on 06/09/2021 04/25/21   Luna Fuse, MD  potassium chloride SA (KLOR-CON M20) 20 MEQ tablet Take 1 tablet (20 mEq total) by mouth daily. Patient not taking: Reported on 04/27/2021 03/30/21   Elayne Snare, MD   rosuvastatin (CRESTOR) 40 MG tablet Take 40 mg by mouth daily. 04/27/21   [provider]  spironolactone (ALDACTONE) 25 MG tablet Take 1 tablet by mouth daily. 04/17/21 07/16/21  [provider]  tiZANidine (ZANAFLEX) 2 MG tablet Take 2 mg by mouth every 12 (twelve) hours as needed for muscle pain. 02/22/20   [provider]    Allergies    Canagliflozin and Hydrocodone  Review of Systems   Review of  Systems  Respiratory:  Positive for cough.   Neurological:  Positive for headaches.  All other systems reviewed and are negative.  Physical Exam Updated Vital Signs BP (!) 160/145    Pulse (!) 51    Temp 98.2 F (36.8 C) (Oral)    Resp 12    Ht 1.676 m (5\' 6" )    Wt 105.2 kg    LMP 08/02/2013 Comment: spotting   SpO2 99%    BMI 37.45 kg/m   Physical Exam Vitals and nursing note reviewed.  Constitutional:      General: She is not in acute distress.    Appearance: She is well-developed.  HENT:     Head: Normocephalic and atraumatic.     Right Ear: External ear normal.     Left Ear: External ear normal.  Eyes:     General: No scleral icterus.       Right eye: No discharge.        Left eye: No discharge.     Conjunctiva/sclera: Conjunctivae normal.     Pupils: Pupils are equal, round, and reactive to light.     Comments: Mild erythema left eye compared to the right covid no mucopurulent drainage  Neck:     Trachea: No tracheal deviation.  Cardiovascular:     Rate and Rhythm: Normal rate and regular rhythm.  Pulmonary:     Effort: Pulmonary effort is normal. No respiratory distress.     Breath sounds: Normal breath sounds. No stridor. No wheezing or rales.  Abdominal:     General: Bowel sounds are normal. There is no distension.     Palpations: Abdomen is soft.     Tenderness: There is no abdominal tenderness. There is no guarding or rebound.  Musculoskeletal:        General: No tenderness or deformity.     Cervical back: Normal range of motion and  neck supple.  Skin:    General: Skin is warm and dry.     Findings: No rash.  Neurological:     General: No focal deficit present.     Mental Status: She is alert.     Cranial Nerves: No cranial nerve deficit (no facial droop, extraocular movements intact, no slurred speech).     Sensory: No sensory deficit.     Motor: No abnormal muscle tone or seizure activity.     Coordination: Coordination normal.  Psychiatric:        Mood and Affect: Mood normal.    ED Results / Procedures / Treatments   Labs (all labs ordered are listed, but only abnormal results are displayed) Labs Reviewed  RESP PANEL BY RT-PCR (FLU A&B, COVID) ARPGX2    EKG None  Radiology No results found.  Procedures Procedures   Medications Ordered in ED Medications  prochlorperazine (COMPAZINE) injection 10 mg (10 mg Intravenous Given 07/02/21 0737)  ketorolac (TORADOL) 30 MG/ML injection 15 mg (15 mg Intravenous Given 07/02/21 1448)    ED Course  I have reviewed the triage vital signs and the nursing notes.  Pertinent labs & imaging results that were available during my care of the patient were reviewed by me and considered in my medical decision making (see chart for details).  Clinical Course as of 07/02/21 0840  Thu Jul 02, 2021  0829 Covid and flu are negative [JK]  [phone] Patient is feeling better after treatment.  Headache is improved. [JK]  0839 BP(!): 160/145 Erroneous entry [JK]    Clinical Course User Index [  JK] Dorie Rank, MD   MDM Rules/Calculators/A&P                         Patient presents with recurrent headaches.  History of similar issues recently.  No thunderclap onset to suggest subarachnoid hemorrhage.  Patient is having URI symptoms with coughing that is increasing the headache.  Patient also does have eye discomfort and had recent cataract surgery but saw her ophthalmologist yesterday.  Doubt acute ocular infection causing her headache.  COVID and flu are negative.  Suspect  viral etiology.    Final Clinical Impression(s) / ED Diagnoses Final diagnoses:  Upper respiratory tract infection, unspecified type  Nonintractable headache, unspecified chronicity pattern, unspecified headache type    Rx / DC Orders ED Discharge Orders          Ordered    benzonatate (TESSALON) 100 MG capsule  Every 8 hours        07/02/21 9323             Dorie Rank, MD 07/02/21 [phone]

## 2021-07-07 ENCOUNTER — Other Ambulatory Visit (INDEPENDENT_AMBULATORY_CARE_PROVIDER_SITE_OTHER): Payer: BC Managed Care – PPO

## 2021-07-07 ENCOUNTER — Other Ambulatory Visit: Payer: Self-pay

## 2021-07-07 DIAGNOSIS — E1165 Type 2 diabetes mellitus with hyperglycemia: Secondary | ICD-10-CM

## 2021-07-07 DIAGNOSIS — Z794 Long term (current) use of insulin: Secondary | ICD-10-CM

## 2021-07-07 LAB — COMPREHENSIVE METABOLIC PANEL
ALT: 23 U/L (ref 0–35)
AST: 19 U/L (ref 0–37)
Albumin: 4 g/dL (ref 3.5–5.2)
Alkaline Phosphatase: 39 U/L (ref 39–117)
BUN: 22 mg/dL (ref 6–23)
CO2: 25 mEq/L (ref 19–32)
Calcium: 9.5 mg/dL (ref 8.4–10.5)
Chloride: 106 mEq/L (ref 96–112)
Creatinine, Ser: 0.83 mg/dL (ref 0.40–1.20)
GFR: 76.66 mL/min (ref >60.00)
Glucose, Bld: 211 mg/dL — ABNORMAL HIGH (ref 70–99)
Potassium: 4 mEq/L (ref 3.5–5.1)
Sodium: 140 mEq/L (ref 135–145)
Total Bilirubin: 0.4 mg/dL (ref 0.2–1.2)
Total Protein: 7.6 g/dL (ref 6.0–8.3)

## 2021-07-07 LAB — HEMOGLOBIN A1C: Hgb A1c MFr Bld: 8.4 % — ABNORMAL HIGH (ref 4.6–6.5)

## 2021-07-08 ENCOUNTER — Other Ambulatory Visit (HOSPITAL_COMMUNITY): Payer: Self-pay | Admitting: Neuroradiology

## 2021-07-08 MED ORDER — ASPIRIN EC 81 MG PO TBEC: 81.0000 mg | DELAYED_RELEASE_TABLET | Freq: Every day | ORAL | 11 refills | Status: DC

## 2021-07-08 MED ORDER — TICAGRELOR 90 MG PO TABS: 90.0000 mg | ORAL_TABLET | Freq: Two times a day (BID) | ORAL | 5 refills | Status: DC

## 2021-07-09 ENCOUNTER — Other Ambulatory Visit: Payer: Self-pay

## 2021-07-09 ENCOUNTER — Ambulatory Visit: Payer: BC Managed Care – PPO | Admitting: Endocrinology

## 2021-07-09 ENCOUNTER — Encounter: Payer: Self-pay | Admitting: Endocrinology

## 2021-07-09 VITALS — BP 118/76 | HR 76 | Ht 66.0 in | Wt 234.8 lb

## 2021-07-09 DIAGNOSIS — Z794 Long term (current) use of insulin: Secondary | ICD-10-CM

## 2021-07-09 DIAGNOSIS — E1165 Type 2 diabetes mellitus with hyperglycemia: Secondary | ICD-10-CM

## 2021-07-09 DIAGNOSIS — I10 Essential (primary) hypertension: Secondary | ICD-10-CM

## 2021-07-09 MED ORDER — TOUJEO SOLOSTAR 300 UNIT/ML ~~LOC~~ SOPN: PEN_INJECTOR | SUBCUTANEOUS | 1 refills | Status: DC

## 2021-07-09 MED ORDER — INSULIN ASPART (W/NIACINAMIDE) 100 UNIT/ML ~~LOC~~ SOPN: 8.0000 [IU] | PEN_INJECTOR | Freq: Three times a day (TID) | SUBCUTANEOUS | 1 refills | Status: DC

## 2021-07-09 NOTE — Patient Instructions (Addendum)
Adjust Fiasp insulin based on meal size and Carbs, keep after mea sugar <180   Toujeo insulin: This insulin provides blood sugar control for up to 24 hours.   Start with 6 units at dinner daily and increase by 2 units every 3 days until the waking up sugars are under 130. Then continue the same dose.  If blood sugar is under 90 for 2 days in a row, reduce the dose by 2 units.   Note that this insulin does not control the rise of blood sugar with meals    Farxiga 5mg  daily

## 2021-07-09 NOTE — Progress Notes (Signed)
Patient ID: Shannon Oliver, female   DOB: 03-10-1961, 61 y.o.   MRN: [id number]          Reason for Appointment: Follow-up  Referring physician: Dr. Fara Olden   History of Present Illness:          Date of diagnosis of type 2 diabetes mellitus: 2008       Background history:   She thinks she has been on mostly oral hypoglycemic drugs notably metformin and Amaryl for most of the duration of her diabetes She was probably started on insulin in 2018 with Levemir insulin when her A1c was 9.6 She has not had an endocrinology follow-up since 03/2017  Recent history:   INSULIN regimen is:  Novolin 70/30, 16 units once a day  Non-insulin hypoglycemic drugs the patient is taking are: Metformin 1000mg  bid, Farxiga 2.5mg    Recent mealtimes: 10-11 am and 6 pm  A1c is improved at 8.4 compared to 11.8  Current management, blood sugar patterns and problems identified: She was previously taking her insulin before dinnertime and now on her own she is taking it late morning  Also has not checked any blood sugars except once after dinner  He was not taking insulin in the evening fasting readings are only mildly increased around 150, previously relatively better She has a regular mealtimes and is not eating breakfast  Not clear if she always takes her insulin before her late morning Did not take it on the day she had her labs  Weight is about the same Not doing any regular exercise  Again she has not gone up on her dose of Farxiga because of tendency to yeast infections with 5 mg dose  Walking: 30 minutes 3 to 4 days a week       Side effects from medications have been: Dizziness and sharp side pains from Invokana, Rybelsus made her sick, Farxiga   Glucose monitoring:  done <1 times a day         Glucometer: [employer]      Blood Glucose readings :   Most of her blood sugars are in the mornings between 7 AM and 11 AM  PRE-MEAL Fasting Lunch Dinner Bedtime Overall  Glucose range: 127-212    261 127-269  Mean/median: 161 168      POST-MEAL PC Breakfast PC Lunch PC Dinner  Glucose range:   ?  Mean/median:        Self-care: The diet that the patient has been following is: tries to limit sugar drinks.     Meal times are: Usually 2 meals a day, variable times  Typical meal intake: Breakfast is eggs and toast               Dietician visit, most recent: Never  Weight history:  Wt Readings from Last 3 Encounters:  07/09/21 234 lb 12.8 oz (106.5 kg)  07/02/21 232 lb (105.2 kg)  06/18/21 231 lb (104.8 kg)    Glycemic control:   Lab Results  Component Value Date   HGBA1C 8.4 (H) 07/07/2021   HGBA1C 11.8 (A) 03/30/2021   HGBA1C 10.3 (H) 09/05/2020   Lab Results  Component Value Date   MICROALBUR 36.2 (H) 09/05/2020   LDLCALC 78 09/05/2020   CREATININE 0.83 07/07/2021   Lab Results  Component Value Date   MICRALBCREAT 24.5 09/05/2020    Lab Results  Component Value Date   FRUCTOSAMINE 345 (H) 04/22/2021   FRUCTOSAMINE 431 (H) 03/27/2021   FRUCTOSAMINE 394 (H) 04/13/2018  Allergies as of 07/09/2021       Reactions   Canagliflozin    Other reaction(s): dizziness, stabbing pain in right side of body   Hydrocodone Hypertension        Medication List        Accurate as of July 09, 2021 11:59 PM. If you have any questions, ask your nurse or doctor.          STOP taking these medications    NovoLIN 70/30 Kwikpen (70-30) 100 UNIT/ML KwikPen Generic drug: insulin isophane & regular human KwikPen Stopped by: Elayne Snare, MD       TAKE these medications    Accu-Chek Guide test strip Generic drug: glucose blood Use as instructed to check blood sugar twice daily.   Accu-Chek Softclix Lancets lancets Use to check blood sugar twice daily.   amLODipine 10 MG tablet Commonly known as: NORVASC TAKE 1 TABLET EVERY DAY ONCE A DAY ORALLY 90   aspirin EC 81 MG tablet Take 1 tablet (81 mg total) by mouth daily. Swallow whole. Start on  07/13/2020.   benzonatate 100 MG capsule Commonly known as: TESSALON Take 1 capsule (100 mg total) by mouth every 8 (eight) hours.   carvedilol 25 MG tablet Commonly known as: COREG Take 25 mg by mouth 2 (two) times daily with a meal.   clotrimazole 1 % cream Commonly known as: LOTRIMIN Apply 1 application topically in the morning and at bedtime.   Dexcom G6 Sensor Misc Use to monitor blood sugar, change after 10 days   Farxiga 5 MG Tabs tablet Generic drug: dapagliflozin propanediol TAKE 1 TABLET (5 MG TOTAL) BY MOUTH DAILY.   insulin aspart 100 UNIT/ML FlexTouch Pen Commonly known as: FIASP Inject 8 Units into the skin 3 (three) times daily before meals. Started by: Elayne Snare, MD   Insulin Syringe-Needle U-100 30G X 1/2" 0.3 ML Misc Use twice a day with insulin   losartan 100 MG tablet Commonly known as: COZAAR Take 100 mg by mouth daily.   metFORMIN 1000 MG tablet Commonly known as: GLUCOPHAGE Take 1,000 mg by mouth daily.   ONE-A-DAY WOMENS PO Take 1 tablet by mouth daily.   oxyCODONE-acetaminophen 5-325 MG tablet Commonly known as: PERCOCET/ROXICET Take 1 tablet by mouth every 6 (six) hours as needed for up to 5 doses for severe pain.   potassium chloride SA 20 MEQ tablet Commonly known as: Klor-Con M20 Take 1 tablet (20 mEq total) by mouth daily.   rosuvastatin 40 MG tablet Commonly known as: CRESTOR Take 40 mg by mouth daily.   spironolactone 25 MG tablet Commonly known as: ALDACTONE Take 1 tablet by mouth daily.   ticagrelor 90 MG Tabs tablet Commonly known as: Brilinta Take 1 tablet (90 mg total) by mouth 2 (two) times daily. Start on 07/13/2021   tiZANidine 2 MG tablet Commonly known as: ZANAFLEX Take 2 mg by mouth every 12 (twelve) hours as needed for muscle pain.   Toujeo SoloStar 300 UNIT/ML Solostar Pen Generic drug: insulin glargine (1 Unit Dial) Adjust as directed Started by: Elayne Snare, MD        Allergies:  Allergies   Allergen Reactions   Canagliflozin     Other reaction(s): dizziness, stabbing pain in right side of body   Hydrocodone Hypertension    Past Medical History:  Diagnosis Date   Abnormal EKG    LVH with strain   Acid reflux    Depression    Diabetes mellitus    A1C  over 9   HLD (hyperlipidemia)    Hypertension    Noncompliance    Obesity     Past Surgical History:  Procedure Laterality Date   CATARACT EXTRACTION Left 06/04/2021   COLONOSCOPY WITH PROPOFOL N/A 04/29/2015   Procedure: COLONOSCOPY WITH PROPOFOL;  Surgeon: Garlan Fair, MD;  Location: [facility];  Service: Endoscopy;  Laterality: N/A;   HYSTEROSCOPY WITH D & C N/A 09/07/2017   Procedure: DILATATION AND CURETTAGE /HYSTEROSCOPY;  Surgeon: Sloan Leiter, MD;  Location: [facility];  Service: Gynecology;  Laterality: N/A;   IR ANGIO INTRA EXTRACRAN SEL COM CAROTID INNOMINATE UNI R MOD SED  06/18/2021   IR ANGIO INTRA EXTRACRAN SEL INTERNAL CAROTID UNI L MOD SED  06/18/2021   IR ANGIO VERTEBRAL SEL VERTEBRAL UNI R MOD SED  06/18/2021   IR RADIOLOGIST EVAL & MGMT  06/19/2021   IR US GUIDE VASC ACCESS RIGHT  06/18/2021   KNEE ARTHROSCOPY W/ MENISCAL REPAIR Right     Family History  Problem Relation Age of Onset   Cancer Mother    Hypertension Mother    Diabetes Mother    Breast cancer Maternal Aunt    Breast cancer Cousin     Social History:  reports that she has never smoked. She has never used smokeless tobacco. She reports that she does not currently use alcohol. She reports that she does not use drugs.   Review of Systems  Psychiatric/Behavioral:  Positive for depressed mood.    Lipid history:  Treated with Zocor 20 mg daily Her last LDL was below 100    Lab Results  Component Value Date   CHOL 163 09/05/2020   HDL 48.00 09/05/2020   LDLCALC 78 09/05/2020   TRIG 187.0 (H) 09/05/2020   CHOLHDL 3 09/05/2020           Hypertension: She is on losartan  100, Aldactone 25 mg, carvedilol and Norvasc from cardiologist  Her wrist instrument at home reads about 150/100  Blood pressure with a cardiologist recently was 150/70  BP Readings from Last 3 Encounters:  07/09/21 118/76  07/02/21 133/86  06/18/21 135/85    Hypokalemia: Has not been taking any potassium supplements and potassium is low again despite starting 25 mg of Aldactone recently  Lab Results  Component Value Date   K 4.0 07/07/2021       LABS:  Lab on 07/07/2021  Component Date Value Ref Range Status   Sodium 07/07/2021 140  135 - 145 mEq/L Final   Potassium 07/07/2021 4.0  3.5 - 5.1 mEq/L Final   Chloride 07/07/2021 106  96 - 112 mEq/L Final   CO2 07/07/2021 25  19 - 32 mEq/L Final   Glucose, Bld 07/07/2021 211 (H)  70 - 99 mg/dL Final   BUN 07/07/2021 22  6 - 23 mg/dL Final   Creatinine, Ser 07/07/2021 0.83  0.40 - 1.20 mg/dL Final   Total Bilirubin 07/07/2021 0.4  0.2 - 1.2 mg/dL Final   Alkaline Phosphatase 07/07/2021 39  39 - 117 U/L Final   AST 07/07/2021 19  0 - 37 U/L Final   ALT 07/07/2021 23  0 - 35 U/L Final   Total Protein 07/07/2021 7.6  6.0 - 8.3 g/dL Final   Albumin 07/07/2021 4.0  3.5 - 5.2 g/dL Final   GFR 07/07/2021 76.66  >60.00 mL/min Final   Calculated using the CKD-EPI Creatinine Equation (2021)   Calcium 07/07/2021 9.5  8.4 - 10.5 mg/dL Final  Hgb A1c MFr Bld 07/07/2021 8.4 (H)  4.6 - 6.5 % Final   Glycemic Control Guidelines for People with Diabetes:Non Diabetic:  <6%Goal of Therapy: <7%Additional Action Suggested:  >8%     Physical Examination:  BP 118/76    Pulse 76    Ht 5\' 6"  (1.676 m)    Wt 234 lb 12.8 oz (106.5 kg)    LMP 08/02/2013 Comment: spotting   SpO2 98%    BMI 37.90 kg/m         ASSESSMENT:  Diabetes type 2, uncontrolled with morbid obesity  See history of present illness for detailed discussion of current diabetes management, blood sugar patterns and problems identified  Her A1c is much better  at 8.4 compared to 11.8  She is on premixed insulin once a day and metformin along with 2.5 mg Farxiga  Her blood sugars have been monitored mostly in the mornings and difficult to assess her postprandial patterns  Previously was not able to get the CGM approved by insurance Again her insulin regimen is somewhat arbitrary and she is now taking it in the morning instead of evening Unclear if her blood sugars are higher after supper but likely will go up without any insulin at that time  Previously had nausea with Rybelsus Taking only 2.5 mg Farxiga because of tendency to yeast infections but not clear how much she is benefiting  HYPERTENSION: She has consistent control now Potassium stable with continuing low-dose spironolactone   PLAN:   Discussed differences between basal and bolus insulin She will need to be switched to a basal insulin along with bolus insulin instead of arbitrary premixed insulin that she is doing Discussed need to adequately cover mealtime insulin requirements and also consistent with basal insulin without overnight low sugars Discussed how basal insulin works, timing of injection, dosage to start and adjust.   Have discussed titration of her starting dose of TOUJEO 6 units at dinnertime based on fasting blood sugar every 3 days by 2 units and at target of 90-130 for fasting reading.  Given a flowsheet with instructions on how to keep a record and adjust the doses  Co-pay card and brochure on insulin given Also explained to her with the help of a patient education pamphlet in detail the need for mealtime insulin to cover postprandial spikes, action of mealtime insulin, use of the insulin pen, timing and action of the rapid acting insulin as well as starting dose and dosage titration to target the two-hour reading of under 180 She will start with 6-8 units at lunch and 8 to 10 units at dinnertime and take it right before eating She will increase Farxiga to 5 mg and let  us know if she needs any prescriptions for antifungals Will also need to see the diabetes educator and short-term follow-up to help evaluate her progress and adjust insulin   Patient Instructions  Adjust Fiasp insulin based on meal size and Carbs, keep after mea sugar <180   Toujeo insulin: This insulin provides blood sugar control for up to 24 hours.   Start with 6 units at dinner daily and increase by 2 units every 3 days until the waking up sugars are under 130. Then continue the same dose.  If blood sugar is under 90 for 2 days in a row, reduce the dose by 2 units.   Note that this insulin does not control the rise of blood sugar with meals    Farxiga 5mg  daily  Elayne Snare 07/10/2021, 8:19 AM   Note: This office note was prepared with Dragon voice recognition system technology. Any transcriptional errors that result from this process are unintentional.

## 2021-07-16 ENCOUNTER — Telehealth: Payer: Self-pay | Admitting: Endocrinology

## 2021-07-16 ENCOUNTER — Other Ambulatory Visit: Payer: Self-pay | Admitting: Radiology

## 2021-07-16 NOTE — Telephone Encounter (Signed)
Patient called stating that the below medication was not filled at the time of her last appointment on 07/09/2021. She also needs some clarification on insulin currently taken.  Please forward medication insulin glargine, 1 Unit Dial, (TOUJEO SOLOSTAR) 300 UNIT/ML Solostar Pen to:  [id number] - [place], [place] - [address] Phone:  [phone]  Fax:  [phone]

## 2021-07-16 NOTE — Telephone Encounter (Signed)
insulin glargine, 1 Unit Dial, (TOUJEO SOLOSTAR) 300 UNIT/ML Solostar Pen

## 2021-07-17 ENCOUNTER — Other Ambulatory Visit (HOSPITAL_COMMUNITY): Payer: Self-pay | Admitting: Physician Assistant

## 2021-07-17 ENCOUNTER — Other Ambulatory Visit: Payer: Self-pay | Admitting: Internal Medicine

## 2021-07-17 ENCOUNTER — Other Ambulatory Visit: Payer: Self-pay | Admitting: Endocrinology

## 2021-07-17 LAB — SARS CORONAVIRUS 2 (TAT 6-24 HRS): SARS Coronavirus 2: NEGATIVE

## 2021-07-17 NOTE — Telephone Encounter (Signed)
Notified patient that Rx was sent on 07/09/21 and she will go to pharmacy to pick up

## 2021-07-17 NOTE — Telephone Encounter (Signed)
Attempted to call patient but no answer. Left voicemail to call back. Per chart medication was sent 07/09/21 and pharmacy confirmed receiving it.

## 2021-07-20 ENCOUNTER — Inpatient Hospital Stay (HOSPITAL_COMMUNITY)
Admission: RE | Admit: 2021-07-20 | Discharge: 2021-07-20 | Disposition: A | Discharge status: Home / Self Care | Payer: BC Managed Care – PPO | Source: Ambulatory Visit | Attending: Neuroradiology | Admitting: Neuroradiology

## 2021-07-20 ENCOUNTER — Inpatient Hospital Stay (HOSPITAL_COMMUNITY): Payer: BC Managed Care – PPO

## 2021-07-20 ENCOUNTER — Ambulatory Visit (HOSPITAL_COMMUNITY): Payer: BC Managed Care – PPO | Admitting: Anesthesiology

## 2021-07-20 ENCOUNTER — Encounter (HOSPITAL_COMMUNITY)
Admission: AD | Disposition: A | Discharge status: Rehabilitation Facility | Payer: Self-pay | Source: Ambulatory Visit | Attending: Neurology

## 2021-07-20 ENCOUNTER — Other Ambulatory Visit: Payer: Self-pay

## 2021-07-20 ENCOUNTER — Encounter (HOSPITAL_COMMUNITY): Payer: Self-pay

## 2021-07-20 ENCOUNTER — Inpatient Hospital Stay (HOSPITAL_COMMUNITY)
Admission: AD | Admit: 2021-07-20 | Discharge: 2021-07-23 | DRG: 025 | Disposition: A | Discharge status: Rehabilitation Facility | Payer: BC Managed Care – PPO | Source: Ambulatory Visit | Attending: Neurology | Admitting: Neurology

## 2021-07-20 DIAGNOSIS — I671 Cerebral aneurysm, nonruptured: Secondary | ICD-10-CM | POA: Diagnosis present

## 2021-07-20 DIAGNOSIS — I6330 Cerebral infarction due to thrombosis of unspecified cerebral artery: Secondary | ICD-10-CM | POA: Diagnosis not present

## 2021-07-20 DIAGNOSIS — I97821 Postprocedural cerebrovascular infarction during other surgery: Secondary | ICD-10-CM | POA: Diagnosis not present

## 2021-07-20 DIAGNOSIS — E119 Type 2 diabetes mellitus without complications: Secondary | ICD-10-CM | POA: Diagnosis present

## 2021-07-20 DIAGNOSIS — I10 Essential (primary) hypertension: Secondary | ICD-10-CM | POA: Diagnosis present

## 2021-07-20 DIAGNOSIS — Z7982 Long term (current) use of aspirin: Secondary | ICD-10-CM | POA: Diagnosis not present

## 2021-07-20 DIAGNOSIS — I63443 Cerebral infarction due to embolism of bilateral cerebellar arteries: Secondary | ICD-10-CM | POA: Diagnosis not present

## 2021-07-20 DIAGNOSIS — R29708 NIHSS score 8: Secondary | ICD-10-CM | POA: Diagnosis not present

## 2021-07-20 DIAGNOSIS — I6389 Other cerebral infarction: Secondary | ICD-10-CM

## 2021-07-20 DIAGNOSIS — Z803 Family history of malignant neoplasm of breast: Secondary | ICD-10-CM

## 2021-07-20 DIAGNOSIS — Z7902 Long term (current) use of antithrombotics/antiplatelets: Secondary | ICD-10-CM

## 2021-07-20 DIAGNOSIS — Y838 Other surgical procedures as the cause of abnormal reaction of the patient, or of later complication, without mention of misadventure at the time of the procedure: Secondary | ICD-10-CM | POA: Diagnosis not present

## 2021-07-20 DIAGNOSIS — Z794 Long term (current) use of insulin: Secondary | ICD-10-CM | POA: Diagnosis not present

## 2021-07-20 DIAGNOSIS — Z7984 Long term (current) use of oral hypoglycemic drugs: Secondary | ICD-10-CM | POA: Diagnosis not present

## 2021-07-20 DIAGNOSIS — E785 Hyperlipidemia, unspecified: Secondary | ICD-10-CM | POA: Diagnosis present

## 2021-07-20 DIAGNOSIS — K219 Gastro-esophageal reflux disease without esophagitis: Secondary | ICD-10-CM | POA: Diagnosis present

## 2021-07-20 DIAGNOSIS — Z888 Allergy status to other drugs, medicaments and biological substances status: Secondary | ICD-10-CM | POA: Diagnosis not present

## 2021-07-20 DIAGNOSIS — Z8249 Family history of ischemic heart disease and other diseases of the circulatory system: Secondary | ICD-10-CM

## 2021-07-20 DIAGNOSIS — I639 Cerebral infarction, unspecified: Secondary | ICD-10-CM

## 2021-07-20 DIAGNOSIS — Z833 Family history of diabetes mellitus: Secondary | ICD-10-CM

## 2021-07-20 DIAGNOSIS — I69351 Hemiplegia and hemiparesis following cerebral infarction affecting right dominant side: Secondary | ICD-10-CM | POA: Diagnosis not present

## 2021-07-20 DIAGNOSIS — E669 Obesity, unspecified: Secondary | ICD-10-CM | POA: Diagnosis present

## 2021-07-20 DIAGNOSIS — Y92234 Operating room of hospital as the place of occurrence of the external cause: Secondary | ICD-10-CM | POA: Diagnosis not present

## 2021-07-20 DIAGNOSIS — G4733 Obstructive sleep apnea (adult) (pediatric): Secondary | ICD-10-CM | POA: Diagnosis present

## 2021-07-20 DIAGNOSIS — Z79899 Other long term (current) drug therapy: Secondary | ICD-10-CM | POA: Diagnosis not present

## 2021-07-20 DIAGNOSIS — Z6837 Body mass index (BMI) 37.0-37.9, adult: Secondary | ICD-10-CM

## 2021-07-20 DIAGNOSIS — E1169 Type 2 diabetes mellitus with other specified complication: Secondary | ICD-10-CM | POA: Diagnosis not present

## 2021-07-20 DIAGNOSIS — G8194 Hemiplegia, unspecified affecting left nondominant side: Secondary | ICD-10-CM | POA: Diagnosis not present

## 2021-07-20 HISTORY — DX: Unspecified asthma, uncomplicated: J45.909

## 2021-07-20 HISTORY — DX: Sleep apnea, unspecified: G47.30

## 2021-07-20 HISTORY — PX: IR ANGIOGRAM FOLLOW UP STUDY: IMG697

## 2021-07-20 HISTORY — DX: Pneumonia, unspecified organism: J18.9

## 2021-07-20 HISTORY — PX: IR TRANSCATH/EMBOLIZ: IMG695

## 2021-07-20 HISTORY — PX: RADIOLOGY WITH ANESTHESIA: SHX6223

## 2021-07-20 HISTORY — PX: IR CT HEAD LTD: IMG2386

## 2021-07-20 HISTORY — PX: IR ANGIO INTRA EXTRACRAN SEL INTERNAL CAROTID UNI R MOD SED: IMG5362

## 2021-07-20 LAB — BASIC METABOLIC PANEL
Anion gap: 11 (ref 5–15)
BUN: 20 mg/dL (ref 6–20)
CO2: 25 mmol/L (ref 22–32)
Calcium: 9.7 mg/dL (ref 8.9–10.3)
Chloride: 103 mmol/L (ref 98–111)
Creatinine, Ser: 1 mg/dL (ref 0.44–1.00)
GFR, Estimated: >60 mL/min (ref >60)
Glucose, Bld: 210 mg/dL — ABNORMAL HIGH (ref 70–99)
Potassium: 4.1 mmol/L (ref 3.5–5.1)
Sodium: 139 mmol/L (ref 135–145)

## 2021-07-20 LAB — CBC WITH DIFFERENTIAL/PLATELET
Abs Immature Granulocytes: 0.01 10*3/uL (ref 0.00–0.07)
Basophils Absolute: 0.1 10*3/uL (ref 0.0–0.1)
Basophils Relative: 1 %
Eosinophils Absolute: 0.2 10*3/uL (ref 0.0–0.5)
Eosinophils Relative: 3 %
HCT: 36.5 % (ref 36.0–46.0)
Hemoglobin: 11.9 g/dL — ABNORMAL LOW (ref 12.0–15.0)
Immature Granulocytes: 0 %
Lymphocytes Relative: 30 %
Lymphs Abs: 1.8 10*3/uL (ref 0.7–4.0)
MCH: 29.5 pg (ref 26.0–34.0)
MCHC: 32.6 g/dL (ref 30.0–36.0)
MCV: 90.6 fL (ref 80.0–100.0)
Monocytes Absolute: 0.6 10*3/uL (ref 0.1–1.0)
Monocytes Relative: 9 %
Neutro Abs: 3.4 10*3/uL (ref 1.7–7.7)
Neutrophils Relative %: 57 %
Platelets: 252 10*3/uL (ref 150–400)
RBC: 4.03 MIL/uL (ref 3.87–5.11)
RDW: 14.3 % (ref 11.5–15.5)
WBC: 6 10*3/uL (ref 4.0–10.5)
nRBC: 0 % (ref 0.0–0.2)

## 2021-07-20 LAB — MRSA NEXT GEN BY PCR, NASAL: MRSA by PCR Next Gen: NOT DETECTED

## 2021-07-20 LAB — ECHOCARDIOGRAM COMPLETE
AR max vel: 2.94 cm2
AV Area VTI: 2.7 cm2
AV Area mean vel: 2.56 cm2
AV Mean grad: 7 mmHg
AV Peak grad: 9.7 mmHg
Ao pk vel: 1.56 m/s
Area-P 1/2: 2.12 cm2
Height: 66 in
S' Lateral: 2.49 cm
Weight: 3696 oz

## 2021-07-20 LAB — PROTIME-INR
INR: 0.9 (ref 0.8–1.2)
INR: 1.1 (ref 0.8–1.2)
Prothrombin Time: 12.1 seconds (ref 11.4–15.2)
Prothrombin Time: 13.7 seconds (ref 11.4–15.2)

## 2021-07-20 LAB — POCT ACTIVATED CLOTTING TIME
Activated Clotting Time: 155 seconds
Activated Clotting Time: 227 seconds
Activated Clotting Time: 245 seconds

## 2021-07-20 LAB — GLUCOSE, CAPILLARY
Glucose-Capillary: 172 mg/dL — ABNORMAL HIGH (ref 70–99)
Glucose-Capillary: 175 mg/dL — ABNORMAL HIGH (ref 70–99)
Glucose-Capillary: 156 mg/dL — ABNORMAL HIGH (ref 70–99)

## 2021-07-20 LAB — HIV ANTIBODY (ROUTINE TESTING W REFLEX): HIV Screen 4th Generation wRfx: NONREACTIVE

## 2021-07-20 LAB — APTT: aPTT: 152 seconds — ABNORMAL HIGH (ref 24–36)

## 2021-07-20 IMAGING — XA IR TRANSCATH EMBOLIZATION
13 of 16 series · 13 of 24 positions shown · IV contrast (IODINE)
Comparison: Cerebral angiogram [DATE].

INDICATION: CHEE is a 60 year old female with past medical history
significant for diabetes, hyperlipidemia, hypertension and cataract.
She presented to the emergency room with headache on [DATE]. At
that time, CT angiogram showed a 4 mm left ICA terminus aneurysm and
a 3 mm right superior hypophyseal artery aneurysm. She underwent a
diagnostic cerebral angiogram on [DATE] that confirmed the
presence of the 2 brain aneurysms seen on prior CTA. She comes to
our service today for elective treatment of her left ICA aneurysm.
In preparation to today's procedure, she was started on Brilinta 90
mg b.i.d. and aspirin 81 mg q.d.

EXAM:
ULTRASOUND-GUIDED VASCULAR [REDACTED] CEREBRAL ANGIOGRAM
ENDOVASCULAR ANEURYSM EMBOLIZATION
FLAT PANEL HEAD CT
TECHNIQUE: Informed written consent was obtained from the patient after a
thorough discussion of the procedural risks, benefits and
alternatives. All questions were addressed.

[Series 1: ir (id) (id)/(id) · 1 of 1 slices shown]
[im 1/1]
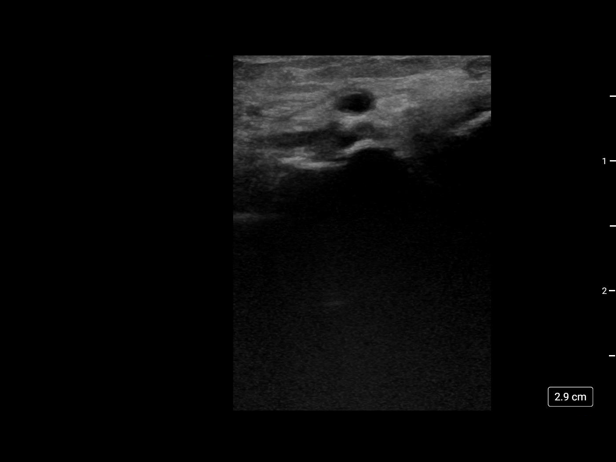

[Series 3: cerebral care 2 · 2 acquisitions, 1 frame shown (1 of 9)]
[im 1/2]
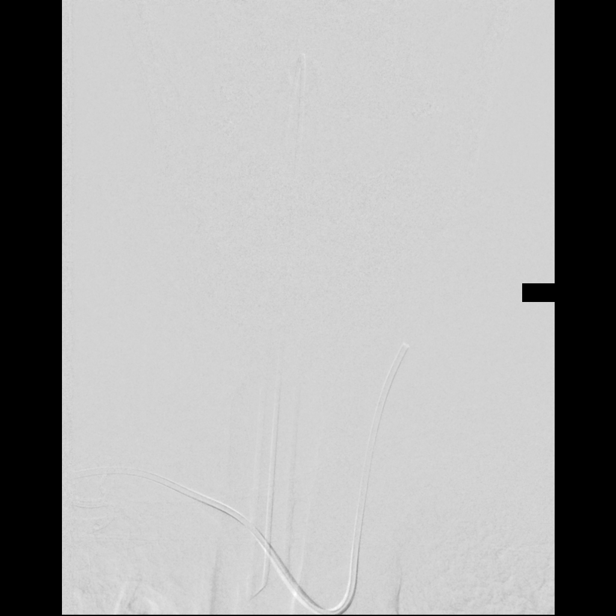

[Series 4: cerebral care 2 · 2 acquisitions, 1 frame shown (2 of 9)]
[im 1/2]
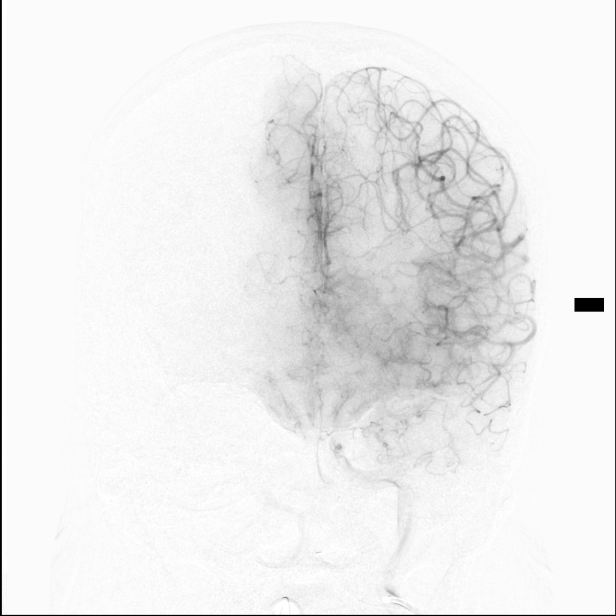

[Series 5: cerebral care 2 · 2 acquisitions, 1 frame shown (3 of 9)]
[im 1/2]
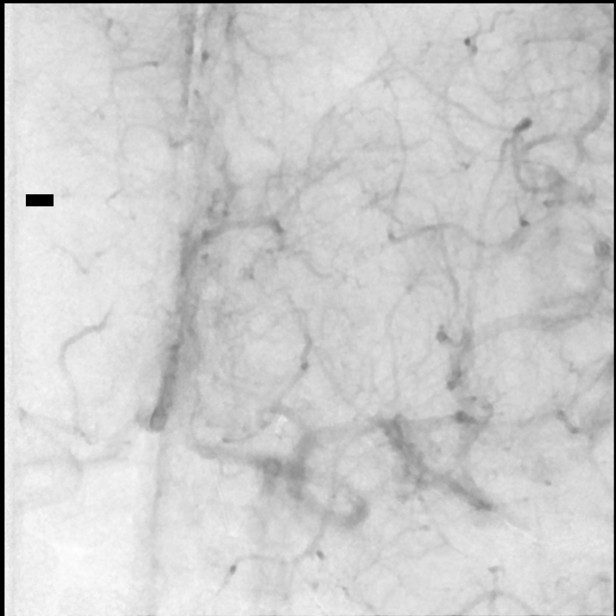

[Series 7: cerebral care 2 · 2 acquisitions, 1 frame shown (4 of 9)]
[im 1/2]
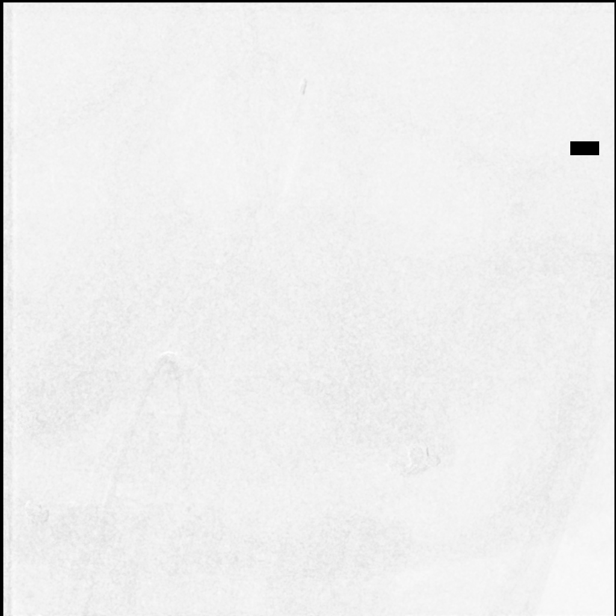

[Series 8: cerebral care 2 · 2 acquisitions, 1 frame shown (5 of 9)]
[im 1/2]
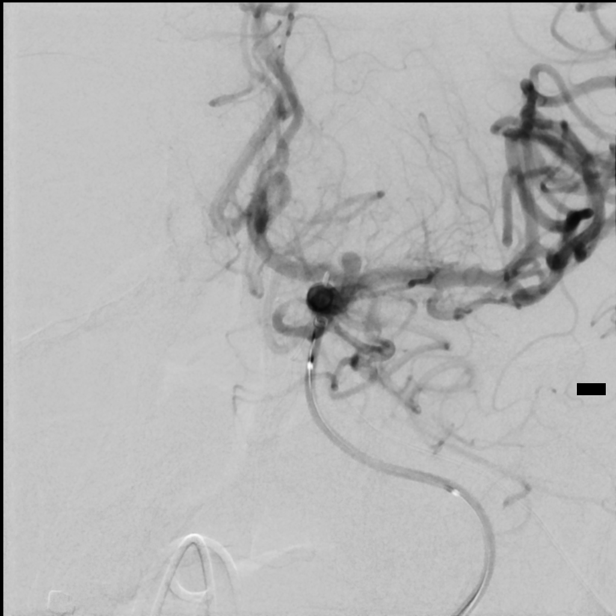

[Series 9: cerebral care 2 · 2 acquisitions, 1 frame shown (6 of 9)]
[im 1/2]
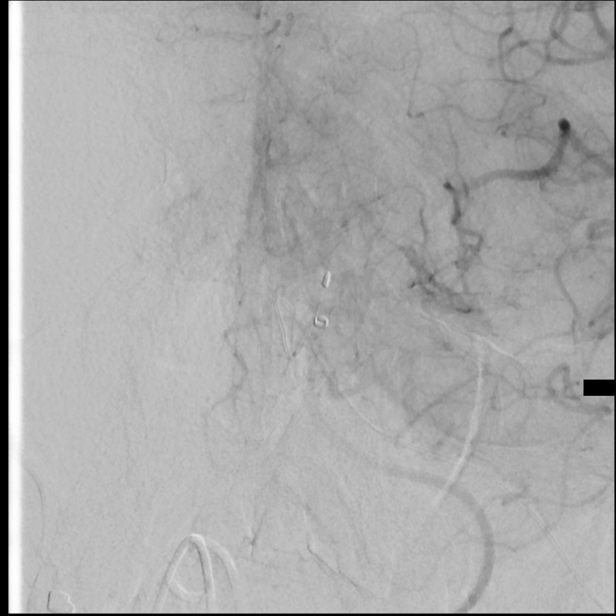

[Series 11: cerebral care 2 · 2 acquisitions, 1 frame shown (7 of 9)]
[im 1/2]
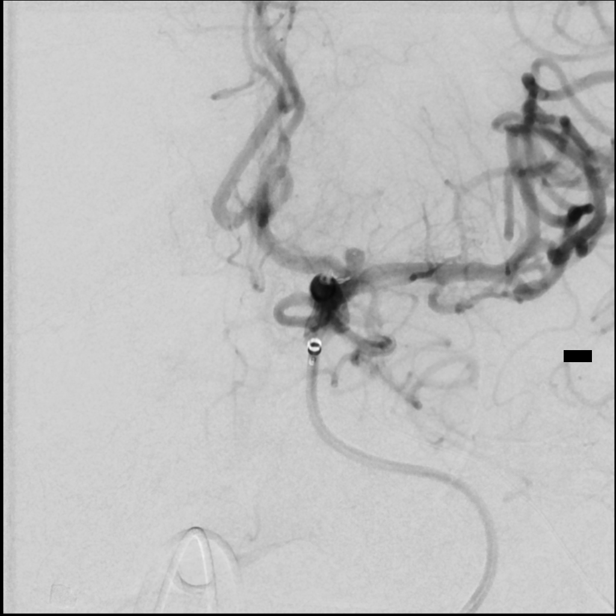

[Series 12: cerebral care 2 · 2 acquisitions, 1 frame shown (8 of 9)]
[im 1/2]
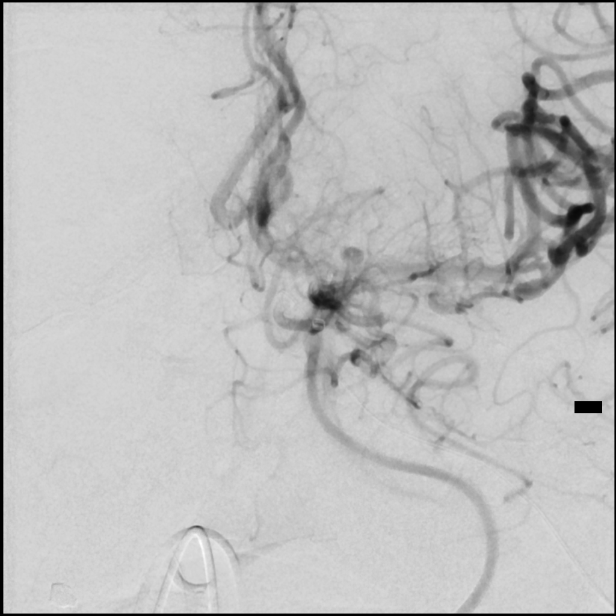

[Series 13: cerebral care 2 · 2 acquisitions, 1 frame shown (9 of 9)]
[im 1/2]
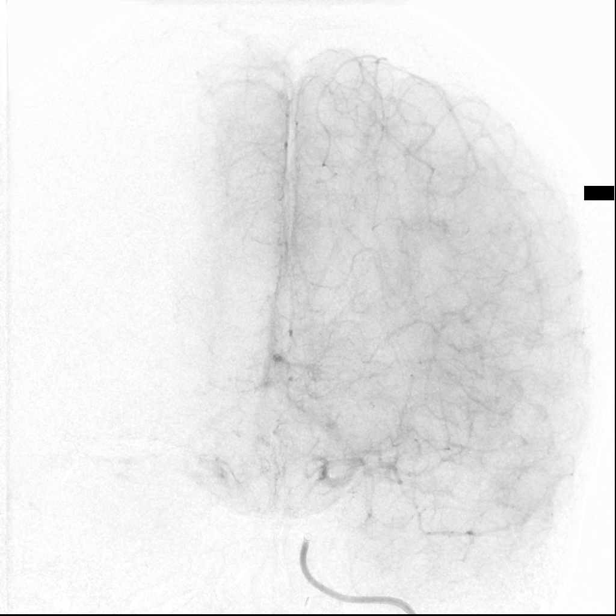

[Series 16: fl neuro n · 1 of 67 frames shown]
[frame 41/67]
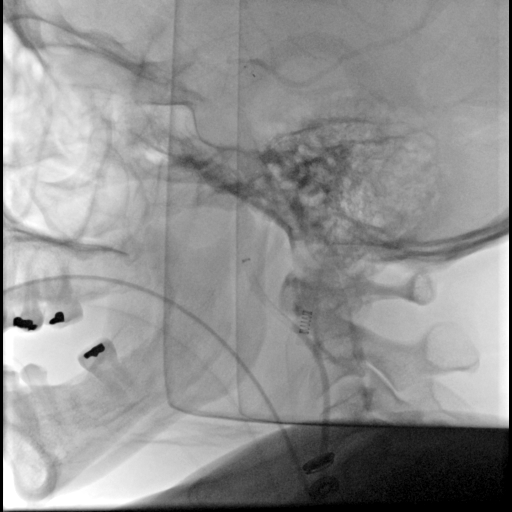

[Series 17: <mpr range>1 · axial · 5.0mm · 0.44mm/px · 1 of 35 slices shown]
[im 1/35]
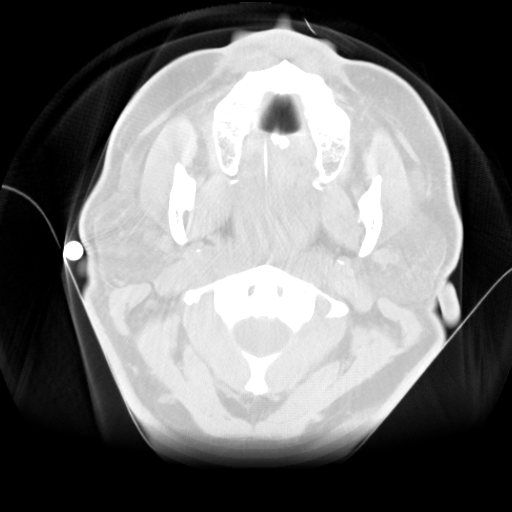

[Series 300: dr. (person_name). · 1 of 28 slices shown]
[im 28/28]
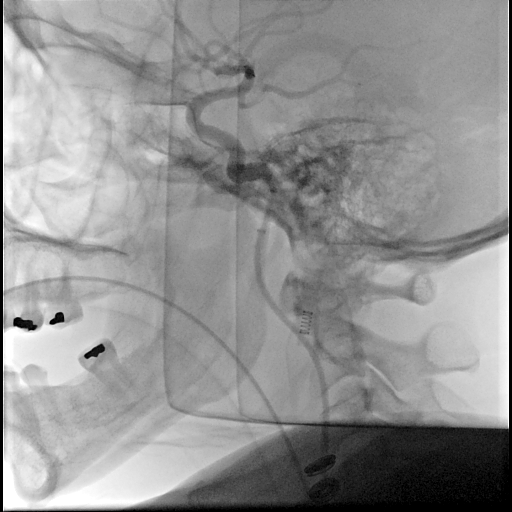

[13 of 24 positions shown; findings below may reference images not displayed]

MEDICATIONS:
Ancef 2 g IV. The antibiotic was administered within 1 hour of the
procedure.

ANESTHESIA/SEDATION:
The study was performed in the general anesthesia.

CONTRAST:  106 cc of Omnipaque 300 milligram/mL

FLUOROSCOPY TIME:  Fluoroscopy Time: 42 minutes 48 seconds (1,843
mGy).

COMPLICATIONS:
Delayed - approximately one hour after procedure, patient developed
left sided weakness, unclear whether procedure related given
intervention in the left ICA.
Maximal Sterile Barrier Technique was utilized including caps, mask,
sterile gowns, sterile gloves, sterile drape, hand hygiene and skin
antiseptic. A timeout was performed prior to the initiation of the
procedure.

Using the modified Seldinger technique and a micropuncture kit,
access was gained to the distal right radial artery at the
anatomical snuffbox and a 7 French sheath was placed. Real-time
ultrasound guidance was utilized for vascular access including the
acquisition of a permanent ultrasound image documenting patency of
the accessed vessel. Slow intra arterial infusion of 5,000 CHEE
heparin, 5 mg Verapamil and 200 mcg nitroglycerin diluted in
patient's own blood was performed. No significant fluctuation in
patient's blood pressure seen. Then, a right radial artery angiogram
was obtained via sheath side port.

Next, a 5 CHEE 2 glide catheter was navigated over a
0.035" Terumo Glidewire into the right subclavian artery under
fluoroscopic guidance. The catheter was then advanced into the left
common carotid artery. Frontal and lateral angiograms of the neck
were obtained. Using biplane roadmap guidance, the catheter was then
advanced into the left internal carotid artery. Frontal, lateral,
magnified waters and magnified lateral views of the head were
obtained.
FINDINGS: 1. Normal brachial artery branching pattern seen. No significant
anatomical variation. The right radial artery caliber is adequate
for vascular access.
2. There is a 3.1 X 2.7 mm saccular aneurysm projecting superiorly
from the left ICA terminus.
3. There is brisk vascular contrast filling of the bilateral ACA and
left MCA vascular trees. The visualized dural sinuses are patent.

PROCEDURE:
The CHEE 2 glide catheter was exchanged over the wire and under
biplane roadmap for a 7 French [REDACTED] catheter which was placed in the
distal cervical segment of the left ICA. Magnified frontal and
lateral angiograms of the head were obtained in the working
projections.

Then, CHEE EX intermediate catheter was navigated through the
risk catheter and over a via 17 microcatheter and a synchro 2 micro
guidewire into the cavernous segment of the right ICA. Magnified
frontal and lateral angiograms of the head were obtained in the
working projections.

The via microcatheter was then navigated over the wire into the left
ICA terminus aneurysm pouch. The wire was removed. Then, a 4 x 2 mm
web SL device was deployed into the aneurysm pouch. Frontal and
lateral magnified angiograms of the head were obtained in the
working projections. Adequate positioning of the device was noted.
The device was then detached under fluoroscopy.

Follow-up left internal carotid artery angiograms with magnified
frontal and lateral views of the head in the working projections
showed stable position of the device within the aneurysm pouch. Left
ICA angiograms with frontal and lateral views of the entire head
were then obtained, showed no evidence of thromboembolic
complication.

Flat panel CT of the head was obtained and post processed in a
separate workstation with concurrent attending physician
supervision. Selected images were sent to PACS. No evidence of
hemorrhagic complication noted.

The catheter was subsequently withdrawn.

An inflatable band was placed and inflated over the right hand
access site. The vascular sheath was withdrawn and the band was
slowly deflated until brisk flow was noted through the arteriotomy
site. At this point, the band was reinflated with additional 2 cc of
air to obtain patent hemostasis.
IMPRESSION: Successful endovascular embolization of a left ICA terminus aneurysm
with a web device. No evidence of hemorrhagic of thromboembolic
complication.

PLAN:
Patient will be admitted to ICU for observation.

## 2021-07-20 IMAGING — CT CT HEAD CODE STROKE
3 series · 14 of 47 positions shown, 16 images · non-contrast
Comparison: [DATE]

CLINICAL DATA: Code stroke.



[Series 3: head 5.0 h30s · axial · 0.45mm/px · z∈[-148,-13]mm · 8 of 33 slices shown, 10 images]
[im 3/33  brain]
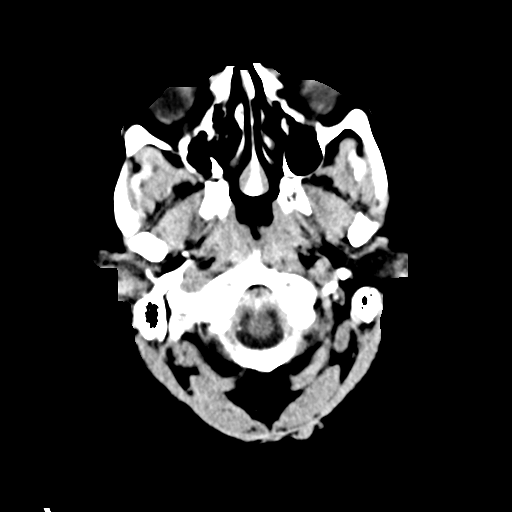
[im 3/33  bone]
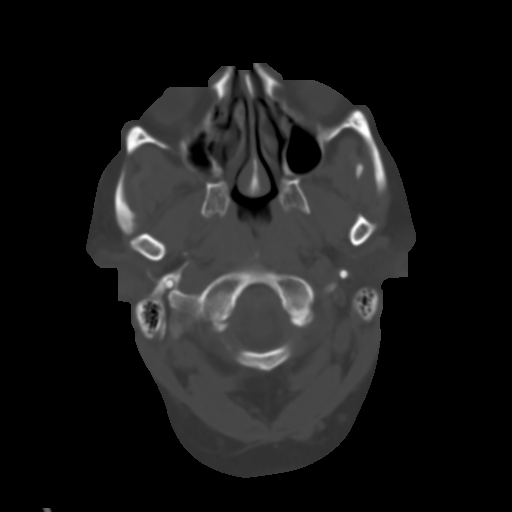
[im 7/33  brain]
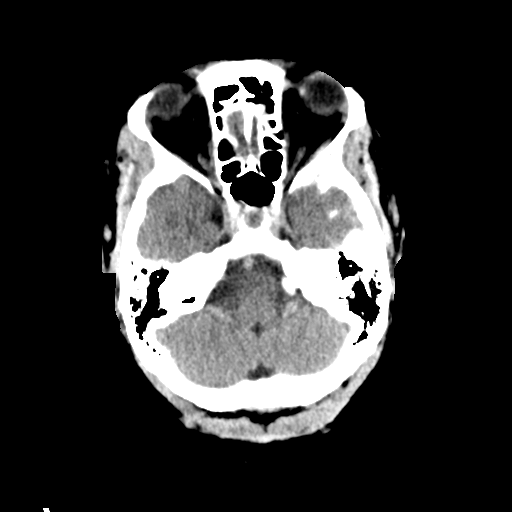
[im 10/33  brain]
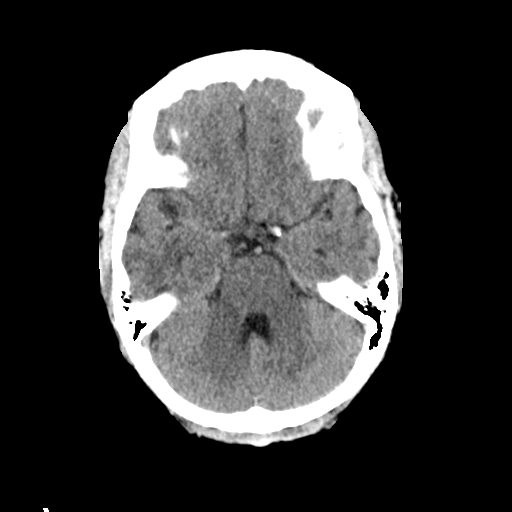
[im 15/33  brain]
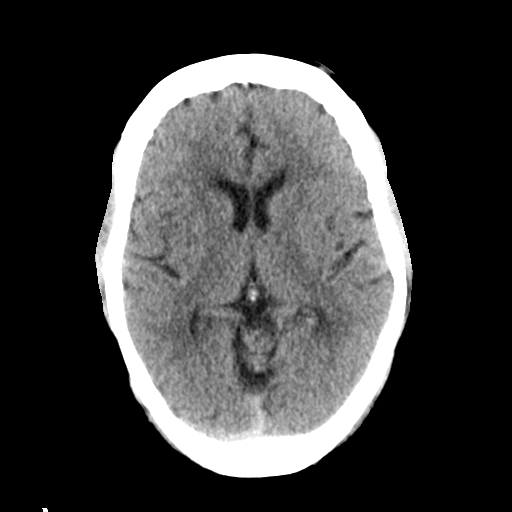
[im 18/33  brain]
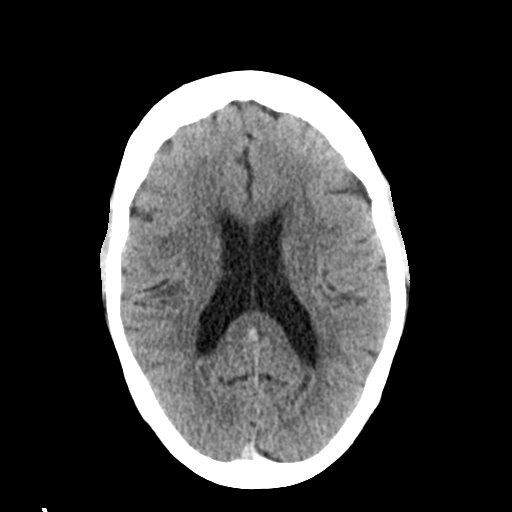
[im 18/33  bone]
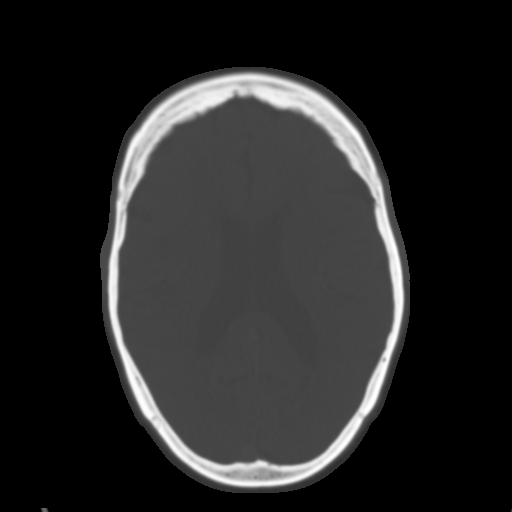
[im 23/33  brain]
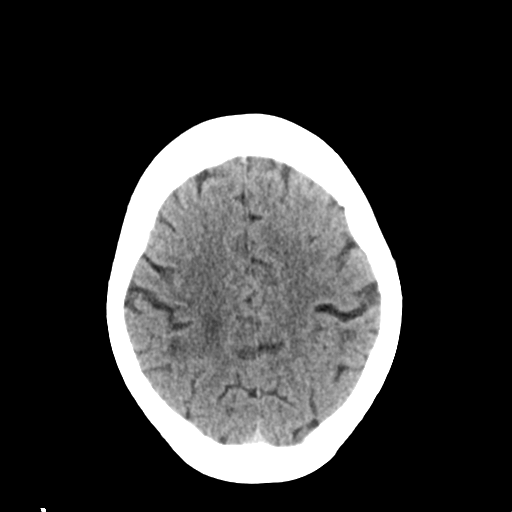
[im 26/33  brain]
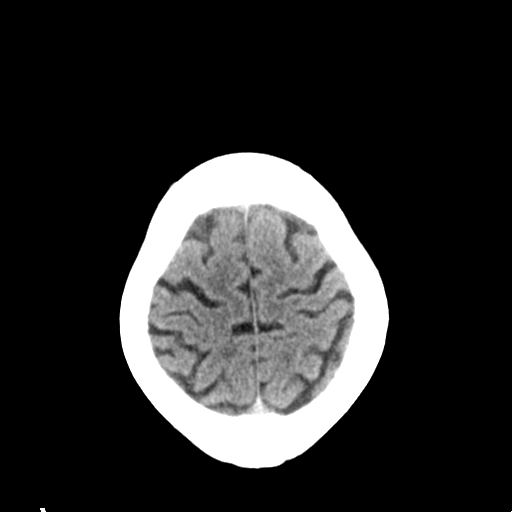
[im 30/33  brain]
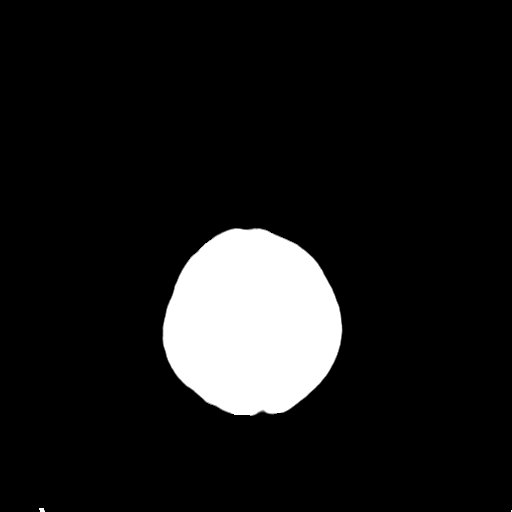

[Series 5: head 3.0 mpr cor · coronal · 0.32mm/px · 3 of 75 slices shown]
[im 25/75  brain]
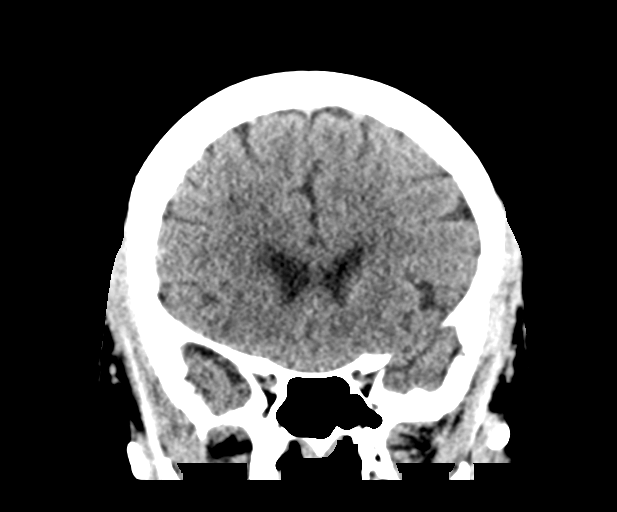
[im 33/75  brain]
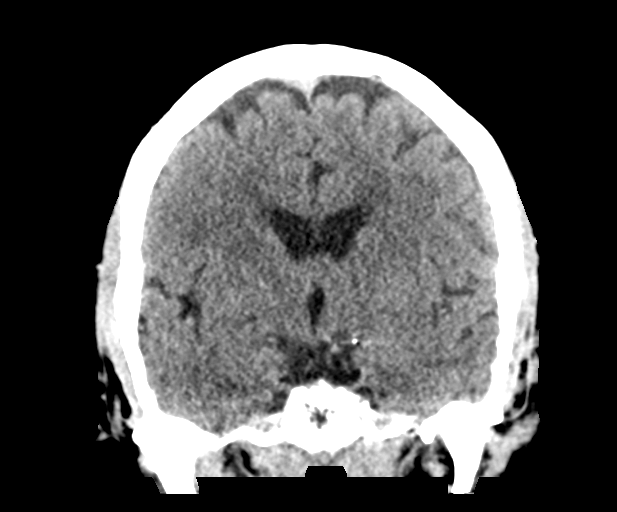
[im 42/75  brain]
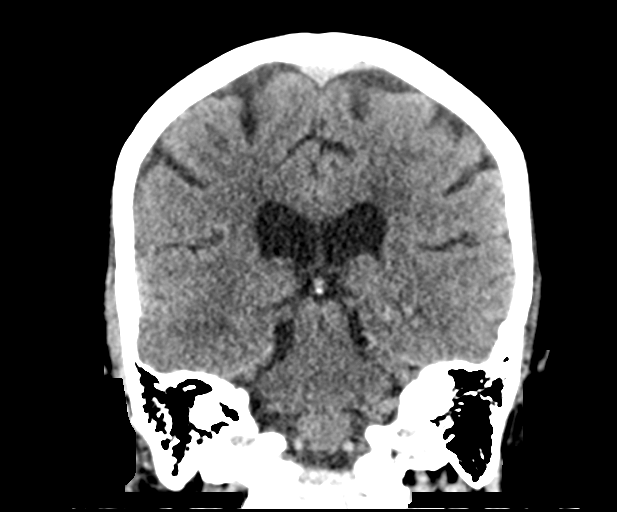

[Series 6: head 3.0 mpr sag · sagittal · 0.32mm/px · 3 of 67 slices shown]
[im 23/67  brain]
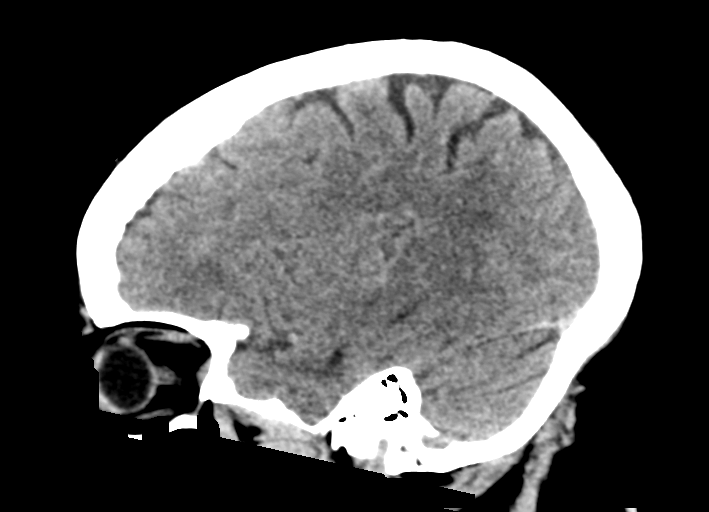
[im 34/67  brain]
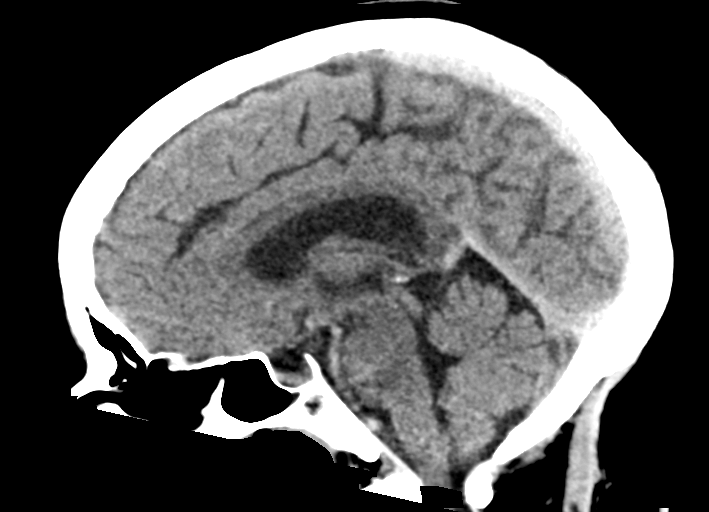
[im 45/67  brain]
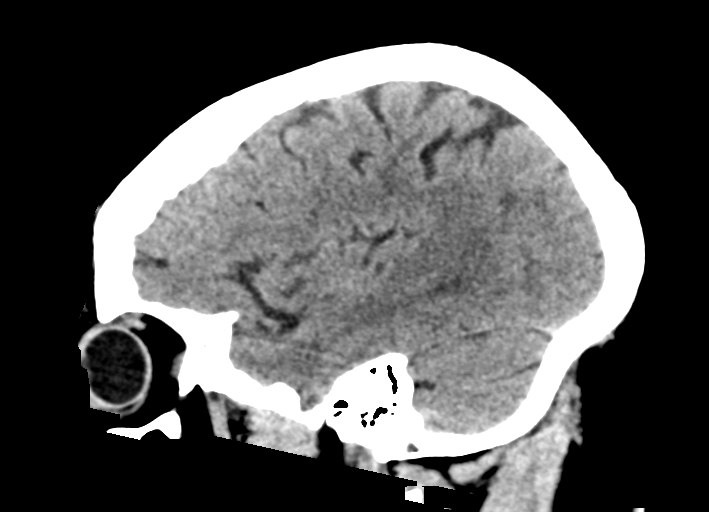

[14 of 47 positions shown; findings below may reference images not displayed]

FINDINGS: Brain: No evidence of acute infarction, hemorrhage, cerebral edema,
mass, mass effect, or midline shift. Ventricles and sulci are normal
for age. No extra-axial fluid collection. Periventricular white
matter changes, likely the sequela of chronic small vessel ischemic
disease.

Vascular: No hyperdense vessel or unexpected calcification.

Skull: Normal. Negative for fracture or focal lesion.

Sinuses/Orbits: Mucosal thickening in the maxillary sinuses. Status
post left lens replacement.

Other: The mastoid air cells are well aerated.

ASPECTS ([place]berta Stroke Program Early CT Score)

- Ganglionic level infarction (caudate, lentiform nuclei, internal
capsule, insula, M1-M3 cortex): 7

- Supraganglionic infarction (M4-M6 cortex): 3

Total score (0-10 with 10 being normal): 10
IMPRESSION: 1. No acute intracranial process.
2. ASPECTS is 10

am to provider Dr. ILIC via secure text paging.

## 2021-07-20 IMAGING — CT CT ANGIO HEAD-NECK (W OR W/O PERF)
1 of 8 series · 14 of 47 positions shown · non-contrast
Comparison: [DATE] CTA head, correlation is also made with same
day CT head

CLINICAL DATA: Neuro deficit, stroke suspected

EXAM:
CT ANGIOGRAPHY HEAD AND NECK
TECHNIQUE: Multidetector CT imaging of the head and neck was performed using
the standard protocol during bolus administration of intravenous
contrast. Multiplanar CT image reconstructions and MIPs were
obtained to evaluate the vascular anatomy. Carotid stenosis
measurements (when applicable) are obtained utilizing NASCET
criteria, using the distal internal carotid diameter as the
denominator.

[Series 7: thin · axial · 0.52mm/px · z∈[-312,-26]mm · 14 of 659 slices shown]
[im 44/659  brain]
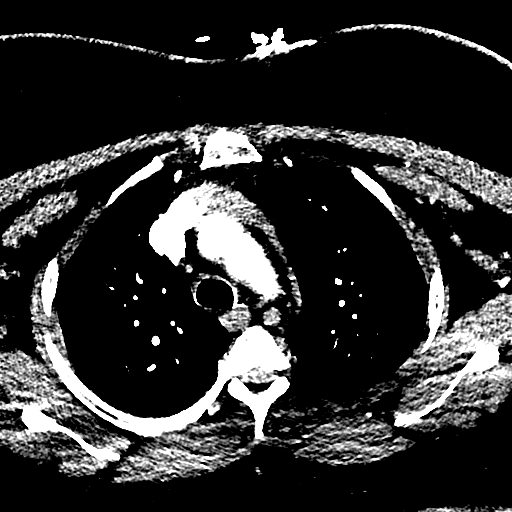
[im 88/659  bone]
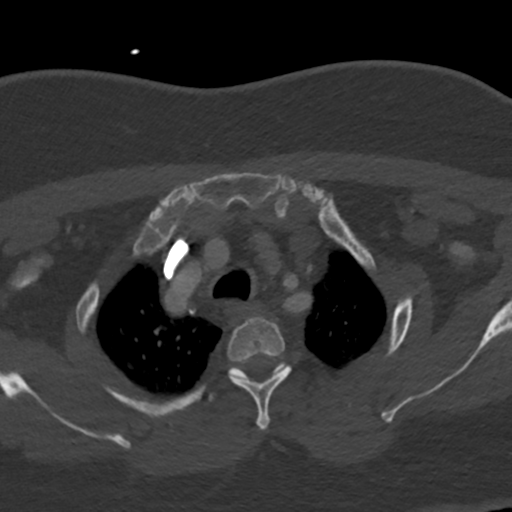
[im 132/659  brain]
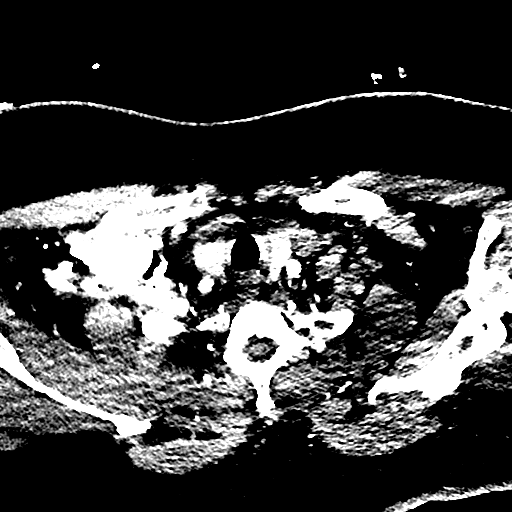
[im 176/659  bone]
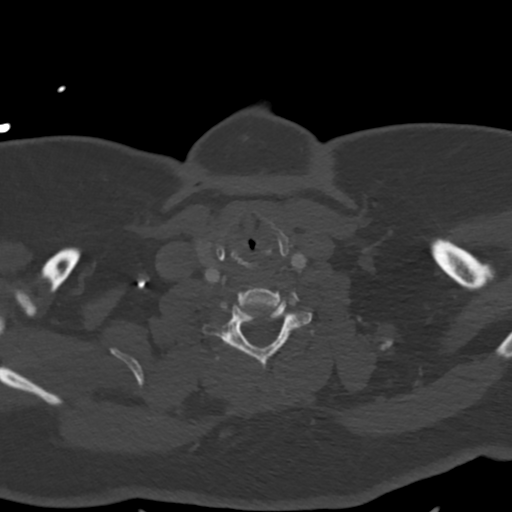
[im 220/659  brain]
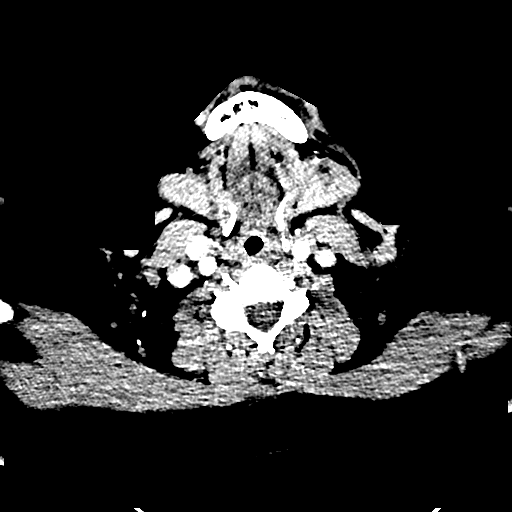
[im 264/659  bone]
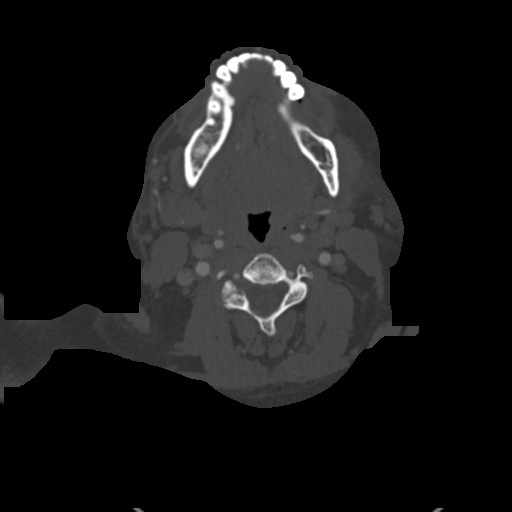
[im 308/659  brain]
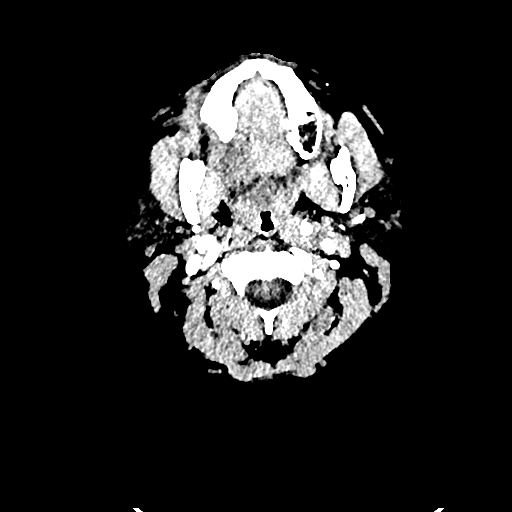
[im 351/659  bone]
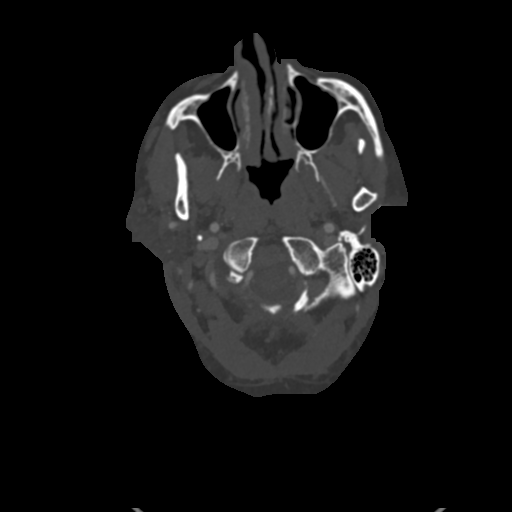
[im 395/659  brain]
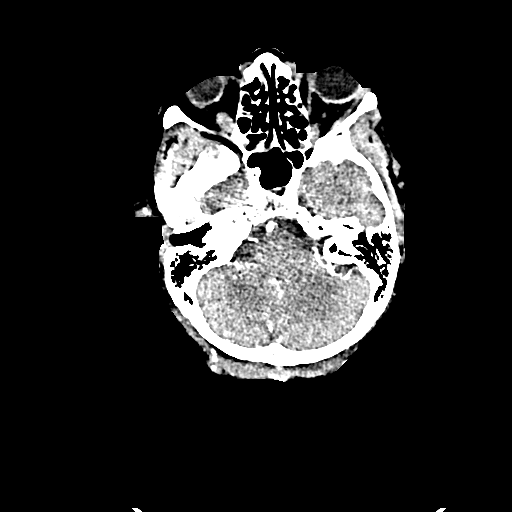
[im 439/659  bone]
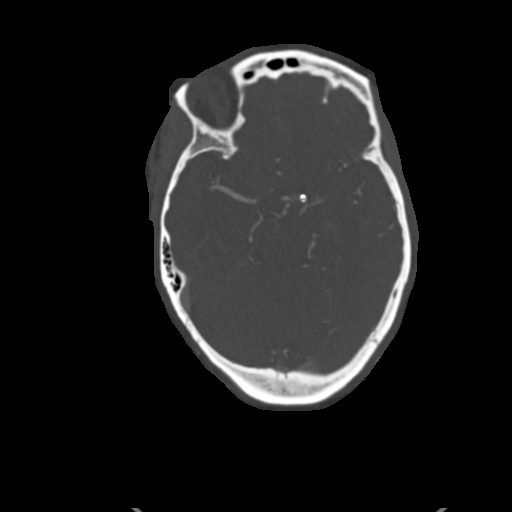
[im 483/659  brain]
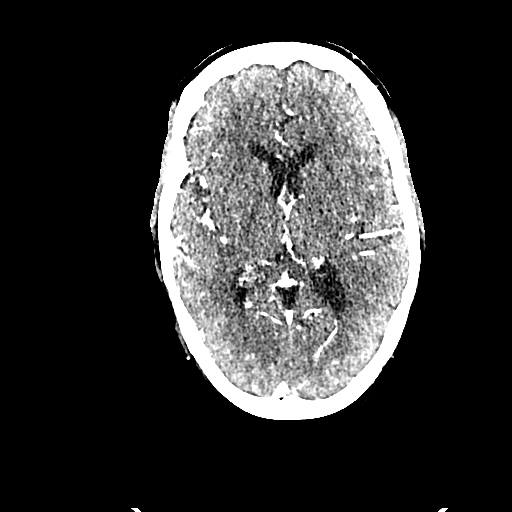
[im 527/659  bone]
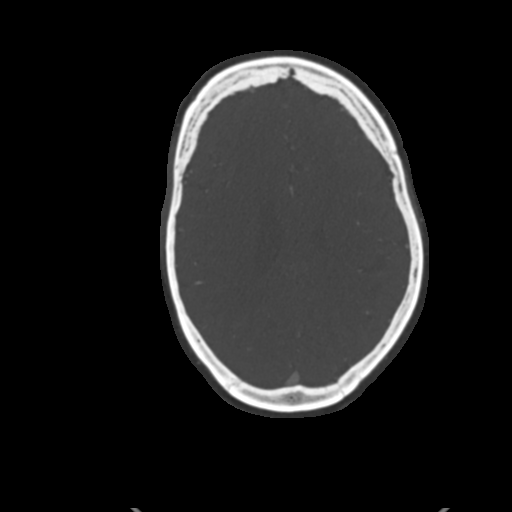
[im 571/659  brain]
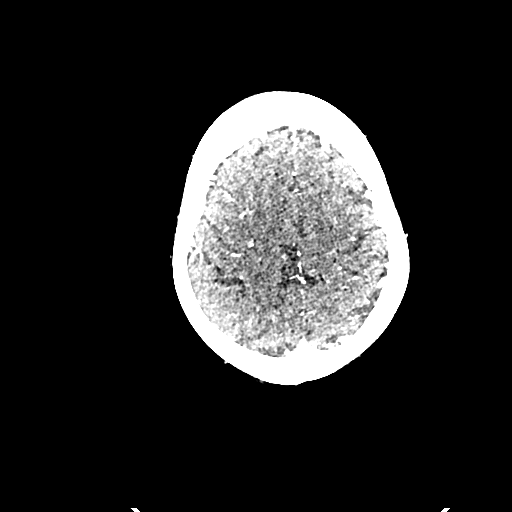
[im 615/659  bone]
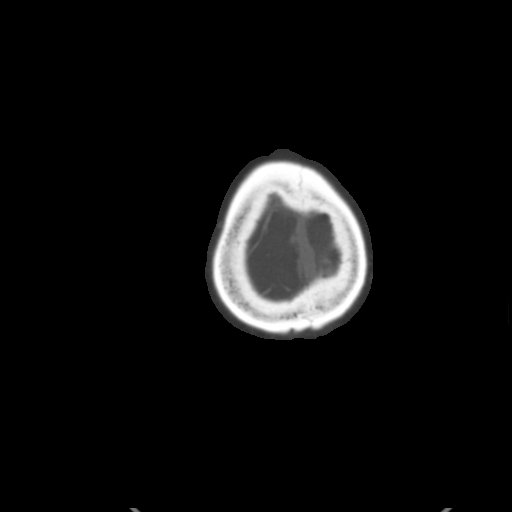

[14 of 47 positions shown; findings below may reference images not displayed]

RADIATION DOSE REDUCTION: This exam was performed according to the
departmental dose-optimization program which includes automated
exposure control, adjustment of the mA and/or kV according to
patient size and/or use of iterative reconstruction technique.

CONTRAST:  75mL OMNIPAQUE IOHEXOL 350 MG/ML SOLN
FINDINGS: CT HEAD FINDINGS

For noncontrast findings, please see same day CT head.

CTA NECK FINDINGS

Aortic arch: Two-vessel arch with a common origin of the
brachiocephalic and left common carotid arteries. Imaged portion
shows no evidence of aneurysm or dissection. No significant stenosis
of the major arch vessel origins.

Right carotid system: No evidence of dissection, stenosis (50% or
greater) or occlusion. Calcified and noncalcified plaque at the
bifurcation, which is not hemodynamically significant.

Left carotid system: No evidence of dissection, stenosis (50% or
greater) or occlusion. Calcified and noncalcified plaque at the
bifurcation which is not hemodynamically significant.

Vertebral arteries: Codominant. No evidence of dissection, stenosis
(50% or greater) or occlusion.

Skeleton: No acute osseous abnormality.

Other neck: Negative.

Upper chest: No focal pulmonary opacity or pleural effusion.

Review of the MIP images confirms the above findings

CTA HEAD FINDINGS

Anterior circulation:

Both internal carotid arteries are patent to the termini. Unchanged
3 mm supero medially projecting aneurysm arising from the distal
cavernous right ICA (series 7, image 244). Interval coiling of the
previously noted superiorly projecting aneurysm arising from the
left ICA terminus at the origin of the left ACA and MCA (series 7,
image 224).

A1 segments patent, with redemonstrated hypoplastic right A1. Normal
anterior communicating artery. Anterior cerebral arteries are patent
to their distal aspects.

No M1 stenosis or occlusion. Normal MCA bifurcations. Distal MCA
branches perfused and symmetric.

Posterior circulation:

Vertebral arteries patent to the vertebrobasilar junction without
stenosis. Posterior inferior cerebral arteries patent bilaterally.

Basilar patent to its distal aspect. Superior cerebellar arteries
patent bilaterally.

Bilateral P1 segments originate from the basilar artery. PCAs
perfused to their distal aspects without stenosis. The bilateral
posterior communicating arteries are patent.

Venous sinuses: As permitted by contrast timing, patent.

Anatomic variants: None significant

Review of the MIP images confirms the above findings
IMPRESSION: 1. Status post interval coiling of a left ICA terminus aneurysm.
2.  No intracranial large vessel occlusion or significant stenosis.
3.  No hemodynamically significant stenosis in the neck.
4. Unchanged 3 mm aneurysm projecting from the distal cavernous
right ICA.

pm to provider Dr. LYUDMILA via secure text paging.

## 2021-07-20 SURGERY — IR WITH ANESTHESIA
Anesthesia: General

## 2021-07-20 MED ORDER — ACETAMINOPHEN 325 MG PO TABS
650.0000 mg | ORAL_TABLET | ORAL | Status: DC | PRN
  Administered 2021-07-21 – 2021-07-23 (×3): 650 mg via ORAL
  Filled 2021-07-20 (×3): qty 2

## 2021-07-20 MED ORDER — TENECTEPLASE FOR STROKE
25.0000 mg | PACK | Freq: Once | INTRAVENOUS | Status: AC
  Administered 2021-07-20: 25 mg via INTRAVENOUS
  Filled 2021-07-20: qty 10

## 2021-07-20 MED ORDER — OXYCODONE HCL 5 MG/5ML PO SOLN: 5.0000 mg | Freq: Once | ORAL | Status: DC | PRN

## 2021-07-20 MED ORDER — FENTANYL CITRATE PF 50 MCG/ML IJ SOSY
50.0000 ug | PREFILLED_SYRINGE | Freq: Once | INTRAMUSCULAR | Status: AC
  Administered 2021-07-20: 50 ug via INTRAVENOUS

## 2021-07-20 MED ORDER — DEXAMETHASONE SODIUM PHOSPHATE 10 MG/ML IJ SOLN
INTRAMUSCULAR | Status: DC | PRN
  Administered 2021-07-20: 5 mg via INTRAVENOUS

## 2021-07-20 MED ORDER — TICAGRELOR 90 MG PO TABS: 90.0000 mg | ORAL_TABLET | Freq: Two times a day (BID) | ORAL | Status: DC

## 2021-07-20 MED ORDER — IOHEXOL 350 MG/ML SOLN
75.0000 mL | Freq: Once | INTRAVENOUS | Status: AC | PRN
  Administered 2021-07-20: 75 mL via INTRAVENOUS

## 2021-07-20 MED ORDER — ASPIRIN 81 MG PO CHEW: 81.0000 mg | CHEWABLE_TABLET | Freq: Every day | ORAL | Status: DC

## 2021-07-20 MED ORDER — PANTOPRAZOLE SODIUM 40 MG IV SOLR
40.0000 mg | Freq: Every day | INTRAVENOUS | Status: DC
  Filled 2021-07-20: qty 40

## 2021-07-20 MED ORDER — SODIUM CHLORIDE 0.9 % IV SOLN: INTRAVENOUS | Status: DC

## 2021-07-20 MED ORDER — IOHEXOL 300 MG/ML  SOLN
100.0000 mL | Freq: Once | INTRAMUSCULAR | Status: AC | PRN
  Administered 2021-07-20: 100 mL via INTRA_ARTERIAL

## 2021-07-20 MED ORDER — ASPIRIN 81 MG PO CHEW
243.0000 mg | CHEWABLE_TABLET | Freq: Once | ORAL | Status: AC
Start: 2021-07-20 — End: 2021-07-20

## 2021-07-20 MED ORDER — ROSUVASTATIN CALCIUM 20 MG PO TABS
40.0000 mg | ORAL_TABLET | Freq: Every day | ORAL | Status: DC
  Administered 2021-07-21 – 2021-07-23 (×3): 40 mg via ORAL
  Filled 2021-07-20 (×3): qty 2

## 2021-07-20 MED ORDER — ONDANSETRON HCL 4 MG/2ML IJ SOLN
INTRAMUSCULAR | Status: DC | PRN
  Administered 2021-07-20: 4 mg via INTRAVENOUS

## 2021-07-20 MED ORDER — LIDOCAINE 2% (20 MG/ML) 5 ML SYRINGE
INTRAMUSCULAR | Status: DC | PRN
Start: 2021-07-20 — End: 2021-07-20
  Administered 2021-07-20: 60 mg via INTRAVENOUS

## 2021-07-20 MED ORDER — VERAPAMIL HCL 2.5 MG/ML IV SOLN: INTRA_ARTERIAL | Status: AC | PRN

## 2021-07-20 MED ORDER — SUGAMMADEX SODIUM 200 MG/2ML IV SOLN
INTRAVENOUS | Status: DC | PRN
  Administered 2021-07-20: 200 mg via INTRAVENOUS
  Administered 2021-07-20: 100 mg via INTRAVENOUS

## 2021-07-20 MED ORDER — VERAPAMIL HCL 2.5 MG/ML IV SOLN
INTRAVENOUS | Status: AC
  Filled 2021-07-20: qty 2

## 2021-07-20 MED ORDER — PHENYLEPHRINE HCL-NACL 20-0.9 MG/250ML-% IV SOLN
INTRAVENOUS | Status: DC | PRN
  Administered 2021-07-20: 75 ug/min via INTRAVENOUS

## 2021-07-20 MED ORDER — SENNOSIDES-DOCUSATE SODIUM 8.6-50 MG PO TABS
1.0000 | ORAL_TABLET | Freq: Every evening | ORAL | Status: DC | PRN
  Administered 2021-07-21 – 2021-07-23 (×2): 1 via ORAL
  Filled 2021-07-20 (×2): qty 1

## 2021-07-20 MED ORDER — CHLORHEXIDINE GLUCONATE 0.12 % MT SOLN
OROMUCOSAL | Status: AC
  Administered 2021-07-20: 15 mL via OROMUCOSAL
  Filled 2021-07-20: qty 15

## 2021-07-20 MED ORDER — ASPIRIN 81 MG PO CHEW
CHEWABLE_TABLET | ORAL | Status: AC
  Administered 2021-07-20: 243 mg via ORAL
  Filled 2021-07-20: qty 3

## 2021-07-20 MED ORDER — PROPOFOL 10 MG/ML IV BOLUS
INTRAVENOUS | Status: DC | PRN
  Administered 2021-07-20: 150 mg via INTRAVENOUS

## 2021-07-20 MED ORDER — EPHEDRINE SULFATE-NACL 50-0.9 MG/10ML-% IV SOSY
PREFILLED_SYRINGE | INTRAVENOUS | Status: DC | PRN
  Administered 2021-07-20: 10 mg via INTRAVENOUS

## 2021-07-20 MED ORDER — ORAL CARE MOUTH RINSE: 15.0000 mL | Freq: Once | OROMUCOSAL | Status: AC

## 2021-07-20 MED ORDER — NITROGLYCERIN 1 MG/10 ML FOR IR/CATH LAB: INTRA_ARTERIAL | Status: AC | PRN

## 2021-07-20 MED ORDER — KETOROLAC TROMETHAMINE 30 MG/ML IJ SOLN
30.0000 mg | Freq: Four times a day (QID) | INTRAMUSCULAR | Status: DC | PRN
  Administered 2021-07-20 – 2021-07-21 (×2): 30 mg via INTRAVENOUS
  Filled 2021-07-20 (×2): qty 1

## 2021-07-20 MED ORDER — ACETAMINOPHEN 325 MG PO TABS: 650.0000 mg | ORAL_TABLET | ORAL | Status: DC | PRN

## 2021-07-20 MED ORDER — FENTANYL CITRATE (PF) 100 MCG/2ML IJ SOLN: 25.0000 ug | INTRAMUSCULAR | Status: DC | PRN

## 2021-07-20 MED ORDER — STROKE: EARLY STAGES OF RECOVERY BOOK
Freq: Once | Status: AC
  Filled 2021-07-20 (×2): qty 1

## 2021-07-20 MED ORDER — NIMODIPINE 30 MG PO CAPS
0.0000 mg | ORAL_CAPSULE | ORAL | Status: AC
  Administered 2021-07-20: 30 mg via ORAL
  Filled 2021-07-20: qty 1
  Filled 2021-07-20: qty 2

## 2021-07-20 MED ORDER — FENTANYL CITRATE (PF) 250 MCG/5ML IJ SOLN
INTRAMUSCULAR | Status: AC
  Filled 2021-07-20: qty 5

## 2021-07-20 MED ORDER — FENTANYL CITRATE (PF) 250 MCG/5ML IJ SOLN
INTRAMUSCULAR | Status: DC | PRN
Start: 2021-07-20 — End: 2021-07-20
  Administered 2021-07-20 (×2): 50 ug via INTRAVENOUS

## 2021-07-20 MED ORDER — SODIUM CHLORIDE (HYPERTONIC) 2 % OP SOLN
1.0000 [drp] | Freq: Every day | OPHTHALMIC | Status: DC | PRN
  Filled 2021-07-20: qty 15

## 2021-07-20 MED ORDER — ACETAMINOPHEN 160 MG/5ML PO SOLN: 650.0000 mg | ORAL | Status: DC | PRN

## 2021-07-20 MED ORDER — ROCURONIUM BROMIDE 10 MG/ML (PF) SYRINGE
PREFILLED_SYRINGE | INTRAVENOUS | Status: DC | PRN
  Administered 2021-07-20: 20 mg via INTRAVENOUS
  Administered 2021-07-20: 100 mg via INTRAVENOUS

## 2021-07-20 MED ORDER — PHENYLEPHRINE 40 MCG/ML (10ML) SYRINGE FOR IV PUSH (FOR BLOOD PRESSURE SUPPORT)
PREFILLED_SYRINGE | INTRAVENOUS | Status: DC | PRN
  Administered 2021-07-20 (×2): 80 ug via INTRAVENOUS

## 2021-07-20 MED ORDER — CHLORHEXIDINE GLUCONATE CLOTH 2 % EX PADS
6.0000 | MEDICATED_PAD | Freq: Every day | CUTANEOUS | Status: DC
  Administered 2021-07-22: 6 via TOPICAL

## 2021-07-20 MED ORDER — FENTANYL CITRATE PF 50 MCG/ML IJ SOSY
PREFILLED_SYRINGE | INTRAMUSCULAR | Status: AC
  Filled 2021-07-20: qty 1

## 2021-07-20 MED ORDER — MIDAZOLAM HCL 2 MG/2ML IJ SOLN
INTRAMUSCULAR | Status: DC | PRN
  Administered 2021-07-20: 2 mg via INTRAVENOUS

## 2021-07-20 MED ORDER — IOHEXOL 300 MG/ML  SOLN
100.0000 mL | Freq: Once | INTRAMUSCULAR | Status: AC | PRN
  Administered 2021-07-20: 6 mL via INTRA_ARTERIAL

## 2021-07-20 MED ORDER — ONDANSETRON HCL 4 MG/2ML IJ SOLN: 4.0000 mg | Freq: Once | INTRAMUSCULAR | Status: DC | PRN

## 2021-07-20 MED ORDER — ADULT MULTIVITAMIN W/MINERALS CH
1.0000 | ORAL_TABLET | Freq: Every day | ORAL | Status: DC
  Administered 2021-07-21 – 2021-07-23 (×3): 1 via ORAL
  Filled 2021-07-20 (×3): qty 1

## 2021-07-20 MED ORDER — CHLORHEXIDINE GLUCONATE 0.12 % MT SOLN: 15.0000 mL | Freq: Once | OROMUCOSAL | Status: AC

## 2021-07-20 MED ORDER — ACETAMINOPHEN 650 MG RE SUPP: 650.0000 mg | RECTAL | Status: DC | PRN

## 2021-07-20 MED ORDER — LIDOCAINE HCL 1 % IJ SOLN
INTRAMUSCULAR | Status: AC
  Filled 2021-07-20: qty 20

## 2021-07-20 MED ORDER — MIDAZOLAM HCL 2 MG/2ML IJ SOLN
INTRAMUSCULAR | Status: AC
  Filled 2021-07-20: qty 2

## 2021-07-20 MED ORDER — FENTANYL CITRATE (PF) 250 MCG/5ML IJ SOLN
INTRAMUSCULAR | Status: DC | PRN
Start: 2021-07-20 — End: 2021-07-20

## 2021-07-20 MED ORDER — CLEVIDIPINE BUTYRATE 0.5 MG/ML IV EMUL: 0.0000 mg/h | INTRAVENOUS | Status: DC

## 2021-07-20 MED ORDER — TICAGRELOR 90 MG PO TABS
90.0000 mg | ORAL_TABLET | ORAL | Status: DC
  Filled 2021-07-20: qty 1

## 2021-07-20 MED ORDER — HEPARIN SODIUM (PORCINE) 1000 UNIT/ML IJ SOLN
INTRAMUSCULAR | Status: DC | PRN
  Administered 2021-07-20: 1000 [IU] via INTRAVENOUS

## 2021-07-20 MED ORDER — ASPIRIN EC 325 MG PO TBEC
325.0000 mg | DELAYED_RELEASE_TABLET | ORAL | Status: DC
  Filled 2021-07-20: qty 1

## 2021-07-20 MED ORDER — MELATONIN 5 MG PO TABS
5.0000 mg | ORAL_TABLET | Freq: Every evening | ORAL | Status: DC | PRN
  Administered 2021-07-21 – 2021-07-22 (×2): 5 mg via ORAL
  Filled 2021-07-20 (×2): qty 1

## 2021-07-20 MED ORDER — ONDANSETRON HCL 4 MG/2ML IJ SOLN: 4.0000 mg | Freq: Four times a day (QID) | INTRAMUSCULAR | Status: DC | PRN

## 2021-07-20 MED ORDER — CEFAZOLIN SODIUM-DEXTROSE 2-4 GM/100ML-% IV SOLN
2.0000 g | INTRAVENOUS | Status: AC
  Administered 2021-07-20: 2 g via INTRAVENOUS
  Filled 2021-07-20 (×2): qty 100

## 2021-07-20 MED ORDER — LACTATED RINGERS IV SOLN
INTRAVENOUS | Status: DC | PRN
Start: 2021-07-20 — End: 2021-07-20

## 2021-07-20 MED ORDER — NITROGLYCERIN 1 MG/10 ML FOR IR/CATH LAB
INTRA_ARTERIAL | Status: AC
  Filled 2021-07-20: qty 10

## 2021-07-20 MED ORDER — OXYCODONE HCL 5 MG PO TABS: 5.0000 mg | ORAL_TABLET | Freq: Once | ORAL | Status: DC | PRN

## 2021-07-20 MED ORDER — HEPARIN SODIUM (PORCINE) 1000 UNIT/ML IJ SOLN
INTRAMUSCULAR | Status: AC
  Filled 2021-07-20: qty 10

## 2021-07-20 NOTE — Progress Notes (Signed)
Rt hand swollen, painful. Dr Estanislado Pandy notified, orders received. Cont to monitor.

## 2021-07-20 NOTE — Anesthesia Preprocedure Evaluation (Signed)
Anesthesia Evaluation  Patient identified by MRN, date of birth, ID band Patient awake    Reviewed: Allergy & Precautions, NPO status , Patient's Chart, lab work & pertinent test results, reviewed documented beta blocker date and time   Airway Mallampati: III  TM Distance: >3 FB Neck ROM: Full    Dental  (+) Dental Advisory Given   Pulmonary shortness of breath and with exertion, asthma , sleep apnea ,    Pulmonary exam normal breath sounds clear to auscultation       Cardiovascular hypertension, Pt. on medications and Pt. 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    GI/Hepatic Neg liver ROS, GERD  Controlled,  Endo/Other  diabetes, Well Controlled, Type 2, Oral Hypoglycemic Agents, Insulin DependentBMI 37  Renal/GU negative Renal ROS  negative genitourinary   Musculoskeletal negative musculoskeletal ROS (+)   Abdominal (+) + obese,   Peds negative pediatric ROS (+)  Hematology negative hematology ROS (+) hct 36.5, plt 252   Anesthesia Other Findings   Reproductive/Obstetrics negative OB ROS                             Anesthesia Physical Anesthesia Plan  ASA: 3  Anesthesia Plan: General   Post-op Pain Management: Minimal or no pain anticipated   Induction: Intravenous  PONV Risk Score and Plan: 3 and Ondansetron, Dexamethasone, Treatment may vary due to age or medical condition and Midazolam  Airway Management Planned: Oral ETT  Additional Equipment: Arterial line  Intra-op Plan:   Post-operative Plan: Extubation in OR  Informed Consent: I have reviewed the patients History and Physical, chart, labs and discussed the procedure including the risks, benefits and alternatives for the proposed anesthesia with the  patient or authorized representative who has indicated his/her understanding and acceptance.     Dental advisory given  Plan Discussed with: CRNA  Anesthesia Plan Comments:         Anesthesia Quick Evaluation

## 2021-07-20 NOTE — Progress Notes (Signed)
PT Cancellation Note  Patient Details Name: CHERA SLIVKA MRN: [id number] DOB: 1961/05/03   Cancelled Treatment:    Reason Eval/Treat Not Completed: Patient not medically ready;Active bedrest order - will check back tomorrow.   Stacie Glaze, PT DPT Acute Rehabilitation Services Pager [phone]  Office [phone]    Louis Matte 07/20/2021, 2:54 PM

## 2021-07-20 NOTE — Progress Notes (Addendum)
Dr. Karenann Cai at bedside to look at right hand swollen. Pressure held by her on the radial artery and distal right radial artery starting at [place]. Verbal order for fentanyl 66mcg IV once for patient's pain.  1937- Left radial artery pressure started, this was the site for a previous arterial line that was removed earlier today.  1945- another verbal order for fentanyl 39mcg IV once ordered.  1950- Right radial pressure stopped, distal radial pressure continued.  2012- Left radial pressure stopped.  2020- Right hand measured at largest swelling point at 29.2cm  2021- Right distal radial pressure stopped

## 2021-07-20 NOTE — Progress Notes (Signed)
PROGRESS NOTE  Called by PACU RN at 11:30 am. Patient unable to move left upper and lower extremity against gravity. Requested code stroke activation. Patient evaluated at bedside and transferred to CT where stroke neurology team was waiting. Patient with significant left sided deficits with weakness and neglect, NIHSS 8. Unclear etiology given intervention on left ICA without right ICA catheterization. Head CT showed no acute findings. Decision made to proceed with TNK.  Update - 2:30 pm: Patient sen in 4 N- ICU. She is showing improvement of the left sided weakness, now able to move against gravity, although with still with significant residual wekness. Radial site stable. Spoke to patient and updated husband, who was present in the room.

## 2021-07-20 NOTE — Anesthesia Postprocedure Evaluation (Signed)
Anesthesia Post Note  Patient: Shenaya Lebo Alligood  Procedure(s) Performed: IR WITH ANESTHESIA     Patient location during evaluation: PACU Anesthesia Type: General Level of consciousness: awake and alert, oriented and patient cooperative Pain management: pain level controlled Vital Signs Assessment: post-procedure vital signs reviewed and stable Respiratory status: spontaneous breathing, nonlabored ventilation and respiratory function stable Cardiovascular status: blood pressure returned to baseline and stable Postop Assessment: no apparent nausea or vomiting Anesthetic complications: no Comments: Initially moving all four extremities in PACU. Call from pacu nurse at 11:34 that patient is no longer able to raise L arm and L leg as she was was able to do initially upon arriving to PACU. Code stroke called, neuroIR at bedside. Transported to CT.    No notable events documented.  Last Vitals:  Vitals:   07/20/21 1245 07/20/21 1300  BP: (!) 141/77 138/80  Pulse: 67 66  Resp: 16 15  Temp:    SpO2:  98%    Last Pain:  Vitals:   07/20/21 1130  TempSrc:   PainSc: 0-No pain                 Pervis Hocking

## 2021-07-20 NOTE — Anesthesia Procedure Notes (Signed)
Procedure Name: Intubation Date/Time: 07/20/2021 8:49 AM Performed by: Janace Litten, CRNA Pre-anesthesia Checklist: Patient identified, Emergency Drugs available, Suction available and Patient being monitored Patient Re-evaluated:Patient Re-evaluated prior to induction Oxygen Delivery Method: Circle System Utilized Preoxygenation: Pre-oxygenation with 100% oxygen Induction Type: IV induction Ventilation: Mask ventilation without difficulty Laryngoscope Size: Mac and 4 Grade View: Grade I Tube type: Oral Tube size: 7.0 mm Number of attempts: 1 Airway Equipment and Method: Stylet and Oral airway Placement Confirmation: ETT inserted through vocal cords under direct vision, positive ETCO2 and breath sounds checked- equal and bilateral Secured at: 19 cm Tube secured with: Tape Dental Injury: Teeth and Oropharynx as per pre-operative assessment

## 2021-07-20 NOTE — Progress Notes (Signed)
Echocardiogram 2D Echocardiogram has been performed.  Arlyss Gandy 07/20/2021, 2:34 PM

## 2021-07-20 NOTE — Progress Notes (Signed)
OT Cancellation Note  Patient Details Name: Shannon Oliver MRN: [id number] DOB: 1961/01/17   Cancelled Treatment:    Reason Eval/Treat Not Completed: Active bedrest order. Will return as schedule allows. Thank you.  [place], OTR/L Acute Rehab Pager: [phone] Office: [phone] 07/20/2021, 2:52 PM

## 2021-07-20 NOTE — Progress Notes (Signed)
Pt has decreased sensation in upper/ lower left extremities. Pt has change in condition from 1100 assessment. Pt unable to lift left arm or left and sensation is decreased from the right side. Dr. Debbrah Alar notified and she gave order to call Code Stroke. Code stroke called and PACU RN transported pt to CT along with Stroke team. Pt then transported from CT to [facility] ICU 18.

## 2021-07-20 NOTE — Procedures (Signed)
INTERVENTIONAL NEURORADIOLOGY BRIEF POSTPROCEDURE NOTE  DIAGNOSTIC CEREBRAL ANGIOGRAM AND ENDOVASCULAR ANEURYSM EMBOLIZATION  Attending: Dr. Pedro Earls  Assistant: None.  Diagnosis: Left ICA terminus aneurysm.  Access site: Distal right radial artery.  Access closure: Inflatable band.  Anesthesia: GETA.  Medication used: Refer to anesthesia documentation.  Complications: None.  Estimated blood loss: Negligible.  Specimen: None.  Findings: A 3 x 2.5 mm left ICA terminus aneurysm. Endovascular embolization performed with a 4 x 2 mm WEB device. No evidence of thromboembolic or hemorrhagic complication.  The patient tolerated the procedure well without incident or complication and is in stable condition.

## 2021-07-20 NOTE — H&P (Signed)
Chief Complaint: Patient was seen in consultation today for Cerebral arteriogram with Left internal carotid artery aneurysm embolization at the request of Dr Willette Cluster  Supervising Physician: Pedro Earls  Patient Status: [facility] - Out-pt  History of Present Illness: Shannon Oliver is a 61 y.o. female   Hx headaches To ED 06/05/21 with HA and nausea Head CT was performed and showed no acute intracranial abnormality.  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  Due to severe headache in the setting of intracranial aneurysm, a lumbar puncture was performed to exclude subarachnoid hemorrhage but was negative. HAs continue Refer to Dr Tennis Must Sindy Messing Arteriogram 12/15 IMPRESSION: 1. A 3 mm superiorly projecting saccular aneurysm at the left ICA terminus. 2. 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 3. A 2 mm aneurysm versus infundibulum at the origin of the right ophthalmic artery. 4. Mild atherosclerotic disease at the bilateral carotid bifurcations without hemodynamically significant stenosis.  Consult 06/30/21: Dr Raliegh Ip Karenann Cai: I discussed the findings of the angiogram with Shannon Oliver and explained the risks and benefits of treatment versus observation. All questions were answered to her satisfaction. She has elected to have her left ICA terminus aneurysm treated endovascularly and the right ICA aneurysm to be observed given it's more smooth contour. I explained she will need to be started on dual anti-platelet therapy prior to intervention. We will arrange to have her procedure scheduled under general anesthesia.   Scheduled today for same   Past Medical History:  Diagnosis Date   Abnormal EKG    LVH with strain   Acid reflux    Asthma    Depression    Diabetes mellitus    A1C over 9   HLD (hyperlipidemia)    Hypertension    Noncompliance     Obesity    Pneumonia    Sleep apnea     Past Surgical History:  Procedure Laterality Date   CARDIAC CATHETERIZATION     CATARACT EXTRACTION Left 06/04/2021   COLONOSCOPY WITH PROPOFOL N/A 04/29/2015   Procedure: COLONOSCOPY WITH PROPOFOL;  Surgeon: Garlan Fair, MD;  Location: [facility];  Service: Endoscopy;  Laterality: N/A;   EYE SURGERY     HYSTEROSCOPY WITH D & C N/A 09/07/2017   Procedure: DILATATION AND CURETTAGE /HYSTEROSCOPY;  Surgeon: Sloan Leiter, MD;  Location: [facility];  Service: Gynecology;  Laterality: N/A;   IR ANGIO INTRA EXTRACRAN SEL COM CAROTID INNOMINATE UNI R MOD SED  06/18/2021   IR ANGIO INTRA EXTRACRAN SEL INTERNAL CAROTID UNI L MOD SED  06/18/2021   IR ANGIO VERTEBRAL SEL VERTEBRAL UNI R MOD SED  06/18/2021   IR RADIOLOGIST EVAL & MGMT  06/19/2021   IR US GUIDE VASC ACCESS RIGHT  06/18/2021   KNEE ARTHROSCOPY W/ MENISCAL REPAIR Right     Allergies: Canagliflozin and Hydrocodone  Medications: Prior to Admission medications   Medication Sig Start Date End Date Taking? Authorizing Provider  acetaminophen (TYLENOL) 500 MG tablet Take 1,000 mg by mouth every 4 (four) hours as needed for moderate pain.   Yes [provider]  amLODipine (NORVASC) 10 MG tablet TAKE 1 TABLET EVERY DAY ONCE A DAY ORALLY 90 03/13/18  Yes Elayne Snare, MD  aspirin EC 81 MG tablet Take 1 tablet (81 mg total) by mouth daily. Swallow whole. Start on 07/13/2020. 07/08/21  Yes de Sindy Messing,  Erven Colla, MD  carvedilol (COREG) 25 MG tablet Take 37.5 mg by mouth 2 (two) times daily with a meal. 04/17/21 07/20/21 Yes [provider]  FARXIGA 5 MG TABS tablet TAKE 1 TABLET (5 MG TOTAL) BY MOUTH DAILY. 06/15/21  Yes Elayne Snare, MD  ibuprofen (ADVIL) 200 MG tablet Take 400 mg by mouth every 4 (four) hours as needed for moderate pain.   Yes [provider]  insulin aspart (FIASP) 100 UNIT/ML FlexTouch Pen Inject 8 Units into the skin 3 (three)  times daily before meals. 07/09/21  Yes Elayne Snare, MD  losartan (COZAAR) 100 MG tablet Take 100 mg by mouth daily. 04/17/21  Yes [provider]  MELATONIN PO Take 1 capsule by mouth at bedtime as needed (sleep).   Yes [provider]  metFORMIN (GLUCOPHAGE) 1000 MG tablet Take 1,000 mg by mouth daily. 03/18/20  Yes [provider]  Multiple Vitamins-Calcium (ONE-A-DAY WOMENS PO) Take 1 tablet by mouth daily.   Yes [provider]  prednisoLONE acetate (PRED FORTE) 1 % ophthalmic suspension Place 1 drop into the left eye daily. 06/13/21  Yes [provider]  promethazine (PHENERGAN) 12.5 MG tablet Take 12.5 mg by mouth every 6 (six) hours as needed for nausea or vomiting.   Yes [provider]  rosuvastatin (CRESTOR) 40 MG tablet Take 40 mg by mouth daily. 04/27/21  Yes [provider]  Sodium Chloride, Hypertonic, (MURO 914 OP) Place 1 application into the left eye at bedtime.   Yes [provider]  SODIUM CHLORIDE, HYPERTONIC, OP Place 1 drop into the left eye daily as needed (dry eyes/irritation).   Yes [provider]  spironolactone (ALDACTONE) 25 MG tablet Take 1 tablet by mouth daily. 04/17/21 07/20/21 Yes [provider]  ticagrelor (BRILINTA) 90 MG TABS tablet Take 1 tablet (90 mg total) by mouth 2 (two) times daily. Start on 07/13/2021 07/08/21  Yes de Sindy Messing, Erven Colla, MD  Accu-Chek Softclix Lancets lancets Use to check blood sugar twice daily. 04/27/21   Elayne Snare, MD  benzonatate (TESSALON) 100 MG capsule Take 1 capsule (100 mg total) by mouth every 8 (eight) hours. Patient not taking: Reported on 07/16/2021 07/02/21   Dorie Rank, MD  Continuous Blood Gluc Sensor (DEXCOM G6 SENSOR) MISC Use to monitor blood sugar, change after 10 days 03/30/21   Elayne Snare, MD  glucose blood (ACCU-CHEK GUIDE) test strip Use as instructed to check blood sugar twice daily. 04/27/21   Elayne Snare, MD  insulin  glargine, 1 Unit Dial, (TOUJEO SOLOSTAR) 300 UNIT/ML Solostar Pen Adjust as directed 07/09/21   Elayne Snare, MD  Insulin Syringe-Needle U-100 30G X 1/2" 0.3 ML MISC Use twice a day with insulin 10/17/17   Elayne Snare, MD  oxyCODONE-acetaminophen (PERCOCET/ROXICET) 5-325 MG tablet Take 1 tablet by mouth every 6 (six) hours as needed for up to 5 doses for severe pain. Patient not taking: Reported on 07/09/2021 04/25/21   Luna Fuse, MD  potassium chloride SA (KLOR-CON M20) 20 MEQ tablet Take 1 tablet (20 mEq total) by mouth daily. Patient not taking: Reported on 07/09/2021 03/30/21   Elayne Snare, MD     Family History  Problem Relation Age of Onset   Cancer Mother    Hypertension Mother    Diabetes Mother    Breast cancer Maternal Aunt    Breast cancer Cousin     Social History   Socioeconomic History   Marital status: Married    Spouse name: Not  on file   Number of children: Not on file   Years of education: Not on file   Highest education level: Not on file  Occupational History   Not on file  Tobacco Use   Smoking status: Never   Smokeless tobacco: Never  Vaping Use   Vaping Use: Never used  Substance and Sexual Activity   Alcohol use: Not Currently    Comment: occ   Drug use: No   Sexual activity: Not on file  Other Topics Concern   Not on file  Social History Narrative   ** Merged History Encounter **       Social Determinants of Health   Financial Resource Strain: Not on file  Food Insecurity: Not on file  Transportation Needs: Not on file  Physical Activity: Not on file  Stress: Not on file  Social Connections: Not on file    Review of Systems: A 12 point ROS discussed and pertinent positives are indicated in the HPI above.  All other systems are negative.  Review of Systems  Constitutional:  Positive for fatigue. Negative for activity change.  Respiratory:  Negative for cough and shortness of breath.   Cardiovascular:  Negative for chest pain.   Gastrointestinal:  Positive for abdominal pain and nausea.  Neurological:  Positive for headaches. Negative for dizziness, tremors, seizures, syncope, facial asymmetry, speech difficulty, weakness, light-headedness and numbness.  Psychiatric/Behavioral:  Negative for behavioral problems and confusion.    Vital Signs: LMP 08/02/2013 Comment: spotting  Physical Exam Vitals reviewed.  HENT:     Mouth/Throat:     Mouth: Mucous membranes are moist.  Eyes:     Extraocular Movements: Extraocular movements intact.  Cardiovascular:     Rate and Rhythm: Normal rate and regular rhythm.     Heart sounds: Normal heart sounds.  Pulmonary:     Breath sounds: Normal breath sounds. No wheezing.  Abdominal:     Tenderness: There is abdominal tenderness.     Comments: Minimal diffuse tenderness  Musculoskeletal:        General: Normal range of motion.     Right lower leg: No edema.     Left lower leg: No edema.  Skin:    General: Skin is warm.  Neurological:     Mental Status: She is alert and oriented to person, place, and time.  Psychiatric:        Mood and Affect: Mood normal.        Behavior: Behavior normal.        Thought Content: Thought content normal.        Judgment: Judgment normal.    Imaging: No results found.  Labs:  CBC: Recent Labs    06/05/21 0720 06/08/21 0829 06/18/21 0950 07/20/21 0642  WBC 5.0 5.5 5.2 6.0  HGB 11.4* 12.7 12.4 11.9*  HCT 35.7* 40.7 38.8 36.5  PLT 196 231 251 252     COAGS: Recent Labs    06/18/21 0950 07/20/21 0642  INR 1.1 0.9     BMP: Recent Labs    06/05/21 0720 06/08/21 0829 06/18/21 0950 07/07/21 1346 07/20/21 0642  NA 139 137 139 140 139  K 3.8 3.8 3.9 4.0 4.1  CL 105 105 106 106 103  CO2 26 25 27 25 25   GLUCOSE 144* 160* 136* 211* 210*  BUN 17 19 14 22 20   CALCIUM 9.7 9.3 9.6 9.5 9.7  CREATININE 0.82 0.82 0.88 0.83 1.00  GFRNONAA >60 >60 >60  --  >60  LIVER FUNCTION TESTS: Recent Labs     09/05/20 1522 06/08/21 0829 07/07/21 1346  BILITOT 0.4 0.4 0.4  AST 21 19 19   ALT 20 19 23   ALKPHOS 80 49 39  PROT 7.6 7.6 7.6  ALBUMIN 4.0 3.9 4.0     TUMOR MARKERS: No results for input(s): AFPTM, CEA, CA199, CHROMGRNA in the last 8760 hours.  Assessment and Plan:  Scheduled for cerebral arteriogram with possible left internal carotid artery aneurysm embolization Risks and benefits of cerebral angiogram with intervention were discussed with the patient including, but not limited to bleeding, infection, vascular injury, contrast induced renal failure, stroke or even death.  This interventional procedure involves the use of X-rays and because of the nature of the planned procedure, it is possible that we will have prolonged use of X-ray fluoroscopy.  Potential radiation risks to you include (but are not limited to) the following: - A slightly elevated risk for cancer  several years later in life. This risk is typically less than 0.5% percent. This risk is low in comparison to the normal incidence of human cancer, which is 33% for women and 50% for men according to the [place]. - Radiation induced injury can include skin redness, resembling a rash, tissue breakdown / ulcers and hair loss (which can be temporary or permanent).   The likelihood of either of these occurring depends on the difficulty of the procedure and whether you are sensitive to radiation due to previous procedures, disease, or genetic conditions.   IF your procedure requires a prolonged use of radiation, you will be notified and given written instructions for further action.  It is your responsibility to monitor the irradiated area for the 2 weeks following the procedure and to notify your physician if you are concerned that you have suffered a radiation induced injury.    All of the patient's questions were answered, patient is agreeable to proceed.  Consent signed and in chart.  Pt is aware she  will be admitted overnight if intervention proceeds She is agreeable   Thank you for this interesting consult.  I greatly enjoyed meeting Shannon Oliver and look forward to participating in their care.  A copy of this report was sent to the requesting provider on this date.  Electronically Signed: Lavonia Drafts, PA-C 07/20/2021, 8:17 AM   I spent a total of    25 Minutes in face to face in clinical consultation, greater than 50% of which was counseling/coordinating care for left internal carotid artery aneurysm embolization

## 2021-07-20 NOTE — H&P (Deleted)
Chief Complaint: Patient was seen in consultation today for Cerebral arteriogram with Left internal carotid artery aneurysm embolization at the request of Dr Willette Cluster  Supervising Physician: Pedro Earls  Patient Status: [facility] - Out-pt  History of Present Illness: Shannon Oliver is a 61 y.o. female   Past Medical History:  Diagnosis Date   Abnormal EKG    LVH with strain   Acid reflux    Asthma    Depression    Diabetes mellitus    A1C over 9   HLD (hyperlipidemia)    Hypertension    Noncompliance    Obesity    Pneumonia    Sleep apnea     Past Surgical History:  Procedure Laterality Date   CARDIAC CATHETERIZATION     CATARACT EXTRACTION Left 06/04/2021   COLONOSCOPY WITH PROPOFOL N/A 04/29/2015   Procedure: COLONOSCOPY WITH PROPOFOL;  Surgeon: Garlan Fair, MD;  Location: [facility];  Service: Endoscopy;  Laterality: N/A;   EYE SURGERY     HYSTEROSCOPY WITH D & C N/A 09/07/2017   Procedure: DILATATION AND CURETTAGE /HYSTEROSCOPY;  Surgeon: Sloan Leiter, MD;  Location: [facility];  Service: Gynecology;  Laterality: N/A;   IR ANGIO INTRA EXTRACRAN SEL COM CAROTID INNOMINATE UNI R MOD SED  06/18/2021   IR ANGIO INTRA EXTRACRAN SEL INTERNAL CAROTID UNI L MOD SED  06/18/2021   IR ANGIO VERTEBRAL SEL VERTEBRAL UNI R MOD SED  06/18/2021   IR RADIOLOGIST EVAL & MGMT  06/19/2021   IR US GUIDE VASC ACCESS RIGHT  06/18/2021   KNEE ARTHROSCOPY W/ MENISCAL REPAIR Right     Allergies: Canagliflozin and Hydrocodone  Medications: Prior to Admission medications   Medication Sig Start Date End Date Taking? Authorizing Provider  acetaminophen (TYLENOL) 500 MG tablet Take 1,000 mg by mouth every 4 (four) hours as needed for moderate pain.   Yes [provider]  amLODipine (NORVASC) 10 MG tablet TAKE 1 TABLET EVERY DAY ONCE A DAY ORALLY 90 03/13/18  Yes Elayne Snare, MD  aspirin EC 81 MG tablet Take 1 tablet (81 mg total) by mouth  daily. Swallow whole. Start on 07/13/2020. 07/08/21  Yes de Sindy Messing, Erven Colla, MD  carvedilol (COREG) 25 MG tablet Take 37.5 mg by mouth 2 (two) times daily with a meal. 04/17/21 07/20/21 Yes [provider]  FARXIGA 5 MG TABS tablet TAKE 1 TABLET (5 MG TOTAL) BY MOUTH DAILY. 06/15/21  Yes Elayne Snare, MD  ibuprofen (ADVIL) 200 MG tablet Take 400 mg by mouth every 4 (four) hours as needed for moderate pain.   Yes [provider]  insulin aspart (FIASP) 100 UNIT/ML FlexTouch Pen Inject 8 Units into the skin 3 (three) times daily before meals. 07/09/21  Yes Elayne Snare, MD  losartan (COZAAR) 100 MG tablet Take 100 mg by mouth daily. 04/17/21  Yes [provider]  MELATONIN PO Take 1 capsule by mouth at bedtime as needed (sleep).   Yes [provider]  metFORMIN (GLUCOPHAGE) 1000 MG tablet Take 1,000 mg by mouth daily. 03/18/20  Yes [provider]  Multiple Vitamins-Calcium (ONE-A-DAY WOMENS PO) Take 1 tablet by mouth daily.   Yes [provider]  prednisoLONE acetate (PRED FORTE) 1 % ophthalmic suspension Place 1 drop into the left eye daily. 06/13/21  Yes [provider]  promethazine (PHENERGAN) 12.5 MG tablet Take 12.5 mg by mouth every 6 (six) hours as needed for nausea or vomiting.   Yes  [provider]  rosuvastatin (CRESTOR) 40 MG tablet Take 40 mg by mouth daily. 04/27/21  Yes [provider]  Sodium Chloride, Hypertonic, (MURO 993 OP) Place 1 application into the left eye at bedtime.   Yes [provider]  SODIUM CHLORIDE, HYPERTONIC, OP Place 1 drop into the left eye daily as needed (dry eyes/irritation).   Yes [provider]  spironolactone (ALDACTONE) 25 MG tablet Take 1 tablet by mouth daily. 04/17/21 07/20/21 Yes [provider]  ticagrelor (BRILINTA) 90 MG TABS tablet Take 1 tablet (90 mg total) by mouth 2 (two) times daily. Start on 07/13/2021 07/08/21  Yes de Sindy Messing,  Erven Colla, MD  Accu-Chek Softclix Lancets lancets Use to check blood sugar twice daily. 04/27/21   Elayne Snare, MD  benzonatate (TESSALON) 100 MG capsule Take 1 capsule (100 mg total) by mouth every 8 (eight) hours. Patient not taking: Reported on 07/16/2021 07/02/21   Dorie Rank, MD  Continuous Blood Gluc Sensor (DEXCOM G6 SENSOR) MISC Use to monitor blood sugar, change after 10 days 03/30/21   Elayne Snare, MD  glucose blood (ACCU-CHEK GUIDE) test strip Use as instructed to check blood sugar twice daily. 04/27/21   Elayne Snare, MD  insulin glargine, 1 Unit Dial, (TOUJEO SOLOSTAR) 300 UNIT/ML Solostar Pen Adjust as directed 07/09/21   Elayne Snare, MD  Insulin Syringe-Needle U-100 30G X 1/2" 0.3 ML MISC Use twice a day with insulin 10/17/17   Elayne Snare, MD  oxyCODONE-acetaminophen (PERCOCET/ROXICET) 5-325 MG tablet Take 1 tablet by mouth every 6 (six) hours as needed for up to 5 doses for severe pain. Patient not taking: Reported on 07/09/2021 04/25/21   Luna Fuse, MD  potassium chloride SA (KLOR-CON M20) 20 MEQ tablet Take 1 tablet (20 mEq total) by mouth daily. Patient not taking: Reported on 07/09/2021 03/30/21   Elayne Snare, MD     Family History  Problem Relation Age of Onset   Cancer Mother    Hypertension Mother    Diabetes Mother    Breast cancer Maternal Aunt    Breast cancer Cousin     Social History   Socioeconomic History   Marital status: Married    Spouse name: Not on file   Number of children: Not on file   Years of education: Not on file   Highest education level: Not on file  Occupational History   Not on file  Tobacco Use   Smoking status: Never   Smokeless tobacco: Never  Vaping Use   Vaping Use: Never used  Substance and Sexual Activity   Alcohol use: Not Currently    Comment: occ   Drug use: No   Sexual activity: Not on file  Other Topics Concern   Not on file  Social History Narrative   ** Merged History Encounter **       Social Determinants of  Health   Financial Resource Strain: Not on file  Food Insecurity: Not on file  Transportation Needs: Not on file  Physical Activity: Not on file  Stress: Not on file  Social Connections: Not on file    Review of Systems: A 12 point ROS discussed and pertinent positives are indicated in the HPI above.  All other systems are negative.  Review of Systems  Constitutional:  Positive for fatigue. Negative for activity change.  Respiratory:  Negative for cough and shortness of breath.   Cardiovascular:  Negative for chest pain.  Gastrointestinal:  Positive for abdominal pain and nausea.  Neurological:  Positive for headaches. Negative for dizziness, tremors, seizures, syncope, facial asymmetry, speech difficulty, weakness, light-headedness and numbness.  Psychiatric/Behavioral:  Negative for behavioral problems and confusion.    Vital Signs: BP 127/71    Pulse 72    Temp (!) 97.4 F (36.3 C) (Oral)    Resp 18    Ht 5\' 6"  (1.676 m)    Wt 231 lb (104.8 kg)    LMP 08/02/2013 Comment: spotting   SpO2 100%    BMI 37.28 kg/m   Physical Exam Vitals reviewed.  HENT:     Mouth/Throat:     Mouth: Mucous membranes are moist.  Eyes:     Extraocular Movements: Extraocular movements intact.  Cardiovascular:     Rate and Rhythm: Normal rate and regular rhythm.     Heart sounds: Normal heart sounds.  Pulmonary:     Breath sounds: Normal breath sounds. No wheezing.  Abdominal:     Tenderness: There is abdominal tenderness.     Comments: Minimal diffuse tenderness  Musculoskeletal:        General: Normal range of motion.     Right lower leg: No edema.     Left lower leg: No edema.  Skin:    General: Skin is warm.  Neurological:     Mental Status: She is alert and oriented to person, place, and time.  Psychiatric:        Mood and Affect: Mood normal.        Behavior: Behavior normal.        Thought Content: Thought content normal.        Judgment: Judgment normal.    Imaging: No results  found.  Labs:  CBC: Recent Labs    06/05/21 0720 06/08/21 0829 06/18/21 0950 07/20/21 0642  WBC 5.0 5.5 5.2 6.0  HGB 11.4* 12.7 12.4 11.9*  HCT 35.7* 40.7 38.8 36.5  PLT 196 231 251 252    COAGS: Recent Labs    06/18/21 0950 07/20/21 0642  INR 1.1 0.9    BMP: Recent Labs    06/05/21 0720 06/08/21 0829 06/18/21 0950 07/07/21 1346 07/20/21 0642  NA 139 137 139 140 139  K 3.8 3.8 3.9 4.0 4.1  CL 105 105 106 106 103  CO2 26 25 27 25 25   GLUCOSE 144* 160* 136* 211* 210*  BUN 17 19 14 22 20   CALCIUM 9.7 9.3 9.6 9.5 9.7  CREATININE 0.82 0.82 0.88 0.83 1.00  GFRNONAA >60 >60 >60  --  >60    LIVER FUNCTION TESTS: Recent Labs    09/05/20 1522 06/08/21 0829 07/07/21 1346  BILITOT 0.4 0.4 0.4  AST 21 19 19   ALT 20 19 23   ALKPHOS 80 49 39  PROT 7.6 7.6 7.6  ALBUMIN 4.0 3.9 4.0    TUMOR MARKERS: No results for input(s): AFPTM, CEA, CA199, CHROMGRNA in the last 8760 hours.  Assessment and Plan:  Scheduled for cerebral arteriogram with possible left internal carotid artery aneurysm embolization Risks and benefits of cerebral angiogram with intervention were discussed with the patient including, but not limited to bleeding, infection, vascular injury, contrast induced renal failure, stroke or even death.  This interventional procedure involves the use of X-rays and because of the nature of the planned procedure, it is possible that we will have prolonged use of X-ray fluoroscopy.  Potential radiation risks to you include (but are not limited to) the following: - A slightly elevated risk for cancer  several years later in life.  This risk is typically less than 0.5% percent. This risk is low in comparison to the normal incidence of human cancer, which is 33% for women and 50% for men according to the [employer]. - Radiation induced injury can include skin redness, resembling a rash, tissue breakdown / ulcers and hair loss (which can be temporary or  permanent).   The likelihood of either of these occurring depends on the difficulty of the procedure and whether you are sensitive to radiation due to previous procedures, disease, or genetic conditions.   IF your procedure requires a prolonged use of radiation, you will be notified and given written instructions for further action.  It is your responsibility to monitor the irradiated area for the 2 weeks following the procedure and to notify your physician if you are concerned that you have suffered a radiation induced injury.    All of the patient's questions were answered, patient is agreeable to proceed.  Consent signed and in chart.  Pt is aware she will be admitted overnight if intervention proceeds She is agreeable   Thank you for this interesting consult.  I greatly enjoyed meeting VERNICA WACHTEL and look forward to participating in their care.  A copy of this report was sent to the requesting provider on this date.  Electronically Signed: Lavonia Drafts, PA-C 07/20/2021, 7:34 AM   I spent a total of    25 Minutes in face to face in clinical consultation, greater than 50% of which was counseling/coordinating care for left internal carotid artery aneurysm embolization

## 2021-07-20 NOTE — Code Documentation (Addendum)
Stroke Response Nurse Documentation Code Documentation  Shannon Oliver is a 61 y.o. female admitted to [facility] on 07/20/2021 for ICA aneurysm embolization with past medical hx of HTN, Diabetes, HLD. Code stroke was activated by PACU after patient was noted to have left sided weakness post-surgery .   SHe was LKW at 0846 when induced for her procedure. After surgery, she was noted to be Alert and Oriented with some very mild left sensory decreased. No weakness noted. At 1115, pt had sudden onset of left sided weakness, facial droop, and worsening sensory. Code Stroke called at 1130.  Stroke team at the bedside after patient activation. Patient to CT with team. NIHSS 8, see documentation for details and code stroke times. Patient with left facial droop, left arm weakness, left leg weakness, and left decreased sensation on exam. The following imaging was completed:  CT, CTA head and neck.   Patient had IV access placed in CT for CTA by MD Fin[place]ane. Pt was thought to have LKW at 1100 upon initial assessment and deemed TNK candidate at 1202. Once patient examined a little further, pt was noted to report sensory decreased while coming out of surgery. LKW had to be reconfirmed and noted to be 0846 upon induction for procedure. Pt still inside 4.5 hour window and TNK given.   Care/Plan: Admit to the ICU. Post-TNK NIHSS and VS per protocol.   Bedside handoff with RN Margaretha Sheffield, RN.    Kathrin Greathouse  Stroke Response RN

## 2021-07-20 NOTE — H&P (Signed)
NEUROLOGY CONSULTATION NOTE   Date of service: July 20, 2021 Patient Name: Shannon Oliver MRN:  [id number] DOB:  1961-06-12 Reason for consult: "left sided weakness and numbness" Requesting Provider: Donnetta Simpers, MD _ _ _   _ __   _ __ _ _  __ __   _ __   __ _  History of Present Illness  Shannon Oliver is a 61 y.o. female with PMH significant for DM2, HTN, HLD, obesity, OSA who underwent R radial approach elective cerebral angio for left ICA aneurysm embolization.  Post procedure reported decreased sensation on the left compared to the right but was noted to be moving all extremities. Around 1115 AM, was noted to be weak in LUE and LLE and a stroke code was subsequently activated.  Initial LKW was thought to be 1115 but after further clarifying the history, LKW of 0846 at the time of induction of anesthesia.  Of note, she did get Aspirin, Brilinta and 5000units of Heparin gtt at 0900. She got an additional 1000units of Heparin around 1000.  mRS: 0 LKW: 0846 at the time of induction of anesthesia. tNKASE: offered. Extensive discussion with patient about risks and benefits including risks of ICH which could result in death. I discussed that the risk of [place] would be slightly higher in her case given she received heparin too. She was initially reluctant to [facility] but given the extent of her weakness, she opted to go for tNKASE. Case was also discussed with Neuro Rads and they were okay with tNKASE. Thrombectomy: not offered, no LVO. NIHSS components Score: Comment  1a Level of Conscious 0[x]  1[]  2[]  3[]      1b LOC Questions 0[x]  1[]  2[]       1c LOC Commands 0[x]  1[]  2[]       2 Best Gaze 0[x]  1[]  2[]       3 Visual 0[x]  1[]  2[]  3[]      4 Facial Palsy 0[]  1[x]  2[]  3[]      5a Motor Arm - left 0[]  1[x]  2[]  3[]  4[]  UN[]    5b Motor Arm - Right 0[x]  1[]  2[]  3[]  4[]  UN[]    6a Motor Leg - Left 0[]  1[]  2[]  3[x]  4[]  UN[]    6b Motor Leg - Right 0[x]  1[]  2[]  3[]  4[]  UN[]    7 Limb Ataxia  0[x]  1[]  2[]  3[]  UN[]     8 Sensory 0[]  1[]  2[x]  UN[]      9 Best Language 0[x]  1[]  2[]  3[]      10 Dysarthria 0[x]  1[]  2[]  UN[]      11 Extinct. and Inattention 0[x]  1[]  2[]       TOTAL: 7     ROS   Constitutional Denies weight loss, fever and chills.   HEENT Denies changes in vision and hearing.   Respiratory Denies SOB and cough.   CV Denies palpitations and CP   GI Denies abdominal pain, nausea, vomiting and diarrhea.   GU Denies dysuria and urinary frequency.   MSK Denies myalgia and joint pain.   Skin Denies rash and pruritus.   Neurological Denies headache and syncope.   Psychiatric Denies recent changes in mood. Denies anxiety and depression.    Past History   Past Medical History:  Diagnosis Date   Abnormal EKG    LVH with strain   Acid reflux    Asthma    Depression    Diabetes mellitus    A1C over 9   HLD (hyperlipidemia)    Hypertension    Noncompliance  Obesity    Pneumonia    Sleep apnea    Past Surgical History:  Procedure Laterality Date   CARDIAC CATHETERIZATION     CATARACT EXTRACTION Left 06/04/2021   COLONOSCOPY WITH PROPOFOL N/A 04/29/2015   Procedure: COLONOSCOPY WITH PROPOFOL;  Surgeon: Garlan Fair, MD;  Location: [facility];  Service: Endoscopy;  Laterality: N/A;   EYE SURGERY     HYSTEROSCOPY WITH D & C N/A 09/07/2017   Procedure: DILATATION AND CURETTAGE /HYSTEROSCOPY;  Surgeon: Sloan Leiter, MD;  Location: [facility];  Service: Gynecology;  Laterality: N/A;   IR ANGIO INTRA EXTRACRAN SEL COM CAROTID INNOMINATE UNI R MOD SED  06/18/2021   IR ANGIO INTRA EXTRACRAN SEL INTERNAL CAROTID UNI L MOD SED  06/18/2021   IR ANGIO VERTEBRAL SEL VERTEBRAL UNI R MOD SED  06/18/2021   IR RADIOLOGIST EVAL & MGMT  06/19/2021   IR US GUIDE VASC ACCESS RIGHT  06/18/2021   KNEE ARTHROSCOPY W/ MENISCAL REPAIR Right    Family History  Problem Relation Age of Onset   Cancer Mother    Hypertension Mother     Diabetes Mother    Breast cancer Maternal Aunt    Breast cancer Cousin    Social History   Socioeconomic History   Marital status: Married    Spouse name: Not on file   Number of children: Not on file   Years of education: Not on file   Highest education level: Not on file  Occupational History   Not on file  Tobacco Use   Smoking status: Never   Smokeless tobacco: Never  Vaping Use   Vaping Use: Never used  Substance and Sexual Activity   Alcohol use: Not Currently    Comment: occ   Drug use: No   Sexual activity: Not on file  Other Topics Concern   Not on file  Social History Narrative   ** Merged History Encounter **       Social Determinants of Health   Financial Resource Strain: Not on file  Food Insecurity: Not on file  Transportation Needs: Not on file  Physical Activity: Not on file  Stress: Not on file  Social Connections: Not on file   Allergies  Allergen Reactions   Canagliflozin     dizziness, stabbing pain in right side of body, tolerates low dose   Hydrocodone Hypertension    Medications   Medications Prior to Admission  Medication Sig Dispense Refill Last Dose   acetaminophen (TYLENOL) 500 MG tablet Take 1,000 mg by mouth every 4 (four) hours as needed for moderate pain.   Past Week   amLODipine (NORVASC) 10 MG tablet TAKE 1 TABLET EVERY DAY ONCE A DAY ORALLY 90 30 tablet 1 07/19/2021   aspirin EC 81 MG tablet Take 1 tablet (81 mg total) by mouth daily. Swallow whole. Start on 07/13/2020. 30 tablet 11 07/20/2021 at 0500   carvedilol (COREG) 25 MG tablet Take 37.5 mg by mouth 2 (two) times daily with a meal.   07/20/2021 at 0500   FARXIGA 5 MG TABS tablet TAKE 1 TABLET (5 MG TOTAL) BY MOUTH DAILY. 30 tablet 1 07/20/2021 at 0600   ibuprofen (ADVIL) 200 MG tablet Take 400 mg by mouth every 4 (four) hours as needed for moderate pain.   Past Month   insulin aspart (FIASP) 100 UNIT/ML FlexTouch Pen Inject 8 Units into the skin 3  (three) times daily before meals. 15 mL 1 07/19/2021   losartan (  COZAAR) 100 MG tablet Take 100 mg by mouth daily.   07/20/2021 at 0500   MELATONIN PO Take 1 capsule by mouth at bedtime as needed (sleep).   07/19/2021   metFORMIN (GLUCOPHAGE) 1000 MG tablet Take 1,000 mg by mouth daily.   07/19/2021   Multiple Vitamins-Calcium (ONE-A-DAY WOMENS PO) Take 1 tablet by mouth daily.   07/19/2021   prednisoLONE acetate (PRED FORTE) 1 % ophthalmic suspension Place 1 drop into the left eye daily.   07/19/2021   promethazine (PHENERGAN) 12.5 MG tablet Take 12.5 mg by mouth every 6 (six) hours as needed for nausea or vomiting.   07/19/2021   rosuvastatin (CRESTOR) 40 MG tablet Take 40 mg by mouth daily.   07/19/2021   Sodium Chloride, Hypertonic, (MURO 496 OP) Place 1 application into the left eye at bedtime.   07/19/2021   SODIUM CHLORIDE, HYPERTONIC, OP Place 1 drop into the left eye daily as needed (dry eyes/irritation).   07/19/2021   spironolactone (ALDACTONE) 25 MG tablet Take 1 tablet by mouth daily.   07/19/2021   ticagrelor (BRILINTA) 90 MG TABS tablet Take 1 tablet (90 mg total) by mouth 2 (two) times daily. Start on 07/13/2021 60 tablet 5 07/20/2021   Accu-Chek Softclix Lancets lancets Use to check blood sugar twice daily. 100 each 3    benzonatate (TESSALON) 100 MG capsule Take 1 capsule (100 mg total) by mouth every 8 (eight) hours. (Patient not taking: Reported on 07/16/2021) 21 capsule 0    Continuous Blood Gluc Sensor (DEXCOM G6 SENSOR) MISC Use to monitor blood sugar, change after 10 days 3 each 3    glucose blood (ACCU-CHEK GUIDE) test strip Use as instructed to check blood sugar twice daily. 100 each 12    insulin glargine, 1 Unit Dial, (TOUJEO SOLOSTAR) 300 UNIT/ML Solostar Pen Adjust as directed 3 mL 1    Insulin Syringe-Needle U-100 30G X 1/2" 0.3 ML MISC Use twice a day with insulin 100 each 0    oxyCODONE-acetaminophen (PERCOCET/ROXICET) 5-325 MG tablet Take 1 tablet by mouth  every 6 (six) hours as needed for up to 5 doses for severe pain. (Patient not taking: Reported on 07/09/2021) 5 tablet 0    potassium chloride SA (KLOR-CON M20) 20 MEQ tablet Take 1 tablet (20 mEq total) by mouth daily. (Patient not taking: Reported on 07/09/2021) 30 tablet 1      Vitals   Vitals:   07/20/21 1100 07/20/21 1115 07/20/21 1218 07/20/21 1219  BP: 120/66 118/75  137/67  Pulse: 62 64  67  Resp: 14 14  16   Temp:      TempSrc:      SpO2: 100% 100% 100% 100%  Weight:      Height:         Body mass index is 37.28 kg/m.  Physical Exam   General: Laying comfortably in bed; in no acute distress.  HENT: Normal oropharynx and mucosa. Normal external appearance of ears and nose.  Neck: Supple, no pain or tenderness  CV: No JVD. No peripheral edema.  Pulmonary: Symmetric Chest rise. Normal respiratory effort.  Abdomen: Soft to touch, non-tender.  Ext: No cyanosis, edema, or deformity  Skin: No rash. Normal palpation of skin.   Musculoskeletal: Normal digits and nails by inspection. No clubbing.   Neurologic Examination  Mental status/Cognition: Alert, oriented to self, place, month and year, good attention.  Speech/language: Fluent, comprehension intact, object naming intact, repetition intact.  Cranial nerves:   CN II Pupils equal  and reactive to light, no VF deficits    CN III,IV,VI EOM intact, no gaze preference or deviation, no nystagmus    CN V Decreased sensation in left face   CN VII no asymmetry, no nasolabial fold flattening    CN VIII normal hearing to speech    CN IX & X normal palatal elevation, no uvular deviation    CN XI 5/5 head turn and 5/5 shoulder shrug bilaterally    CN XII midline tongue protrusion    Motor:  Muscle bulk: normal, tone normal. Mvmt Root Nerve  Muscle Right Left Comments  SA C5/6 Ax Deltoid 5    EF C5/6 Mc Biceps 5 4   EE C6/7/8 Rad Triceps 5 4   WF C6/7 Med FCR     WE C7/8 PIN ECU     F Ab C8/T1 U ADM/FDI 5 4   HF L1/2/3 Fem  Illopsoas 5 2   KE L2/3/4 Fem Quad 5    DF L4/5 D Peron Tib Ant 5 2   PF S1/2 Tibial Grc/Sol 5 2    Reflexes:  Right Left Comments  Pectoralis      Biceps (C5/6) 2 2   Brachioradialis (C5/6) 2 2    Triceps (C6/7) 2 2    Patellar (L3/4) 2 2    Achilles (S1)      Hoffman      Plantar     Jaw jerk    Sensation:  Light touch Decreased on the left.   Pin prick    Temperature    Vibration   Proprioception    Coordination/Complex Motor:  - Finger to Nose intact on the right, unable to do with LUE - Heel to shin unable to do - Rapid alternating movement are slowed on the left - Gait: deferred for patient safety.  Labs   CBC:  Recent Labs  Lab 07/20/21 0642  WBC 6.0  NEUTROABS 3.4  HGB 11.9*  HCT 36.5  MCV 90.6  PLT 154    Basic Metabolic Panel:  Lab Results  Component Value Date   NA 139 07/20/2021   K 4.1 07/20/2021   CO2 25 07/20/2021   GLUCOSE 210 (H) 07/20/2021   BUN 20 07/20/2021   CREATININE 1.00 07/20/2021   CALCIUM 9.7 07/20/2021   GFRNONAA >60 07/20/2021   GFRAA >60 08/09/2018   Lipid Panel:  Lab Results  Component Value Date   LDLCALC 78 09/05/2020   HgbA1c:  Lab Results  Component Value Date   HGBA1C 8.4 (H) 07/07/2021   Urine Drug Screen: No results found for: LABOPIA, COCAINSCRNUR, LABBENZ, AMPHETMU, THCU, LABBARB  Alcohol Level No results found for: [place]  CT Head without contrast(Personally reviewed): CTH was negative for a large hypodensity concerning for a large territory infarct or hyperdensity concerning for an [place]  CT angio Head and Neck with contrast(Personally reviewed): No LVO on my read.  MRI Brain: pending Impression   Shannon Oliver is a 61 y.o. female with PMH significant for DM2, HTN, HLD, obesity, OSA who underwent R radial approach elective cerebral angio for left ICA aneurysm embolization. Post procedure reported decreased sensation on the left compared to the right and was later noted to be weak in LUE and LLE.  CTH negative for ICH. CTA with no LVO. tNKASE was discussed and she opted for tNKASE. Of note, she was given heparin injection for the angiogram and received 5000 units around 9AM and an addition 1000 units at 10AM.  She was  more than 2 hours past the last time she was given heparin and given that the dose was small, she was offered tNKASE.  Recommendations  Stroke by clinical diagnosis: - Frequent NeuroChecks for post tNK care per stroke unit protocol: - Initial CTH demonstrated no acute hemorrhage or mass - MRI Brain - pending. - CTA - no LVO - TTE - pending - Lipid Panel: LDL - pending.  - Statin: continue home rosuvastatin for now - HbA1c: pending - Antithrombotic: Start ASA 81 mg daily if 24 h CTH does not show acute hemorrhage - DVT prophylaxis: SCDs. Pharmacologic prophylaxis if 24 h CTH does not demonstrate acute hemorrhage - Systolic Blood Pressure goal: < 180 mm Hg - Telemetry monitoring for arrhythmia: 72 hours - Swallow screen - ordered - PT/OT/SLP consults  S/p L ICA aneurysm embolization and coiling: - Neuro IR consulted. - Will hold off on Antiplatelet given she is post tNKASE. - Discussed with Neuro IR, will follow post tNKASE BP goals as above.  DM2: - SSI with carb modified diet when tolerating PO  HTN: - hold home meds - goal SBP as above   HLD: - LDL pending - continue home Rosuvastatin  Dry eyes: - continue home ophthalmic drops.  ______________________________________________________________________  This patient is critically ill and at significant risk of neurological worsening, death and care requires constant monitoring of vital signs, hemodynamics,respiratory and cardiac monitoring, neurological assessment, discussion with family, other specialists and medical decision making of high complexity. I spent 90 minutes of neurocritical care time  in the care of  this patient. This was time spent independent of any time provided by nurse practitioner or  PA.  [place] Pager Number [phone] 07/20/2021  5:20 PM   Thank you for the opportunity to take part in the care of this patient. If you have any further questions, please contact the neurology consultation attending.  Signed,  [place] Pager Number [phone] _ _ _   _ __   _ __ _ _  __ __   _ __   __ _

## 2021-07-20 NOTE — Transfer of Care (Signed)
Immediate Anesthesia Transfer of Care Note  Patient: Shannon Oliver  Procedure(s) Performed: IR WITH ANESTHESIA  Patient Location: PACU  Anesthesia Type:General  Level of Consciousness: drowsy, patient cooperative and responds to stimulation  Airway & Oxygen Therapy: Patient Spontanous Breathing  Post-op Assessment: Report given to RN and Post -op Vital signs reviewed and stable  Post vital signs: Reviewed and stable  Last Vitals:  Vitals Value Taken Time  BP 120/66 07/20/21 1056  Temp    Pulse 65 07/20/21 1058  Resp 14 07/20/21 1058  SpO2 100 % 07/20/21 1058  Vitals shown include unvalidated device data.  Last Pain:  Vitals:   07/20/21 0709  TempSrc:   PainSc: 0-No pain         Complications: No notable events documented.

## 2021-07-20 NOTE — Anesthesia Procedure Notes (Signed)
Arterial Line Insertion Start/End1/16/2023 7:35 AM Performed by: Janace Litten, CRNA, CRNA  Preanesthetic checklist: patient identified, IV checked, surgical consent, monitors and equipment checked and pre-op evaluation Lidocaine 1% used for infiltration Left, radial was placed Catheter size: 20 G Hand hygiene performed  and maximum sterile barriers used   Attempts: 1 Procedure performed without using ultrasound guided technique. Following insertion, dressing applied and Biopatch. Post procedure assessment: normal

## 2021-07-21 ENCOUNTER — Encounter (HOSPITAL_COMMUNITY): Payer: Self-pay | Admitting: Neuroradiology

## 2021-07-21 ENCOUNTER — Inpatient Hospital Stay (HOSPITAL_COMMUNITY): Payer: BC Managed Care – PPO

## 2021-07-21 DIAGNOSIS — I671 Cerebral aneurysm, nonruptured: Principal | ICD-10-CM

## 2021-07-21 LAB — LIPID PANEL
Cholesterol: 129 mg/dL (ref 0–200)
HDL: 49 mg/dL (ref >40)
LDL Cholesterol: 69 mg/dL (ref 0–99)
Total CHOL/HDL Ratio: 2.6 RATIO
Triglycerides: 53 mg/dL (ref <150)
VLDL: 11 mg/dL (ref 0–40)

## 2021-07-21 LAB — HEMOGLOBIN A1C
Hgb A1c MFr Bld: 8.4 % — ABNORMAL HIGH (ref 4.8–5.6)
Mean Plasma Glucose: 194.38 mg/dL

## 2021-07-21 IMAGING — MR MR HEAD W/O CM
12 of 13 series · 44 of 48 positions shown · non-contrast
Comparison: CT head without contrast and CTA head and neck
[DATE].

CLINICAL DATA: 60-year-old female status post endovascular
embolization of left ICA terminus aneurysm with web device.
Neurologic deficit.

EXAM:
MRI HEAD WITHOUT CONTRAST
TECHNIQUE: Multiplanar, multiecho pulse sequences of the brain and surrounding
structures were obtained without intravenous contrast.

[Series 5: DWI · axial · 3.0mm · 0.96mm/px · z∈[-97,+65]mm · 7 of 116 slices shown (1 of 4)]
[im 1/116]
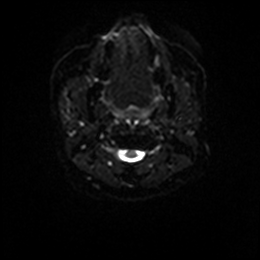
[im 20/116]
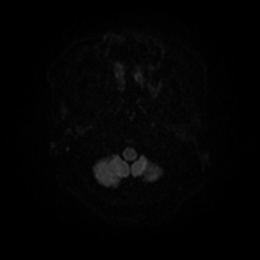
[im 39/116]
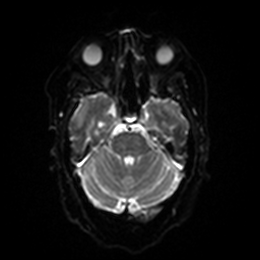
[im 58/116]
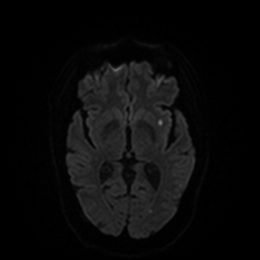
[im 77/116]
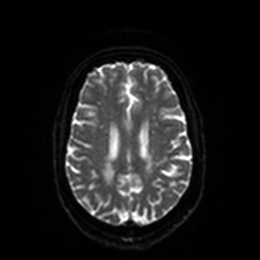
[im 96/116]
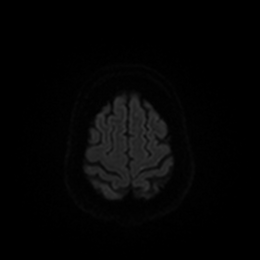
[im 116/116]
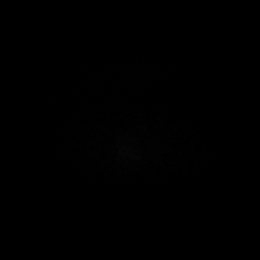

[Series 6: DWI · axial · 3.0mm · 0.96mm/px · z∈[-97,+65]mm · 4 of 57 slices shown (2 of 4)]
[im 1/57]
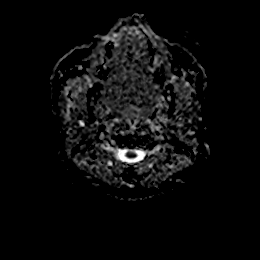
[im 19/57]
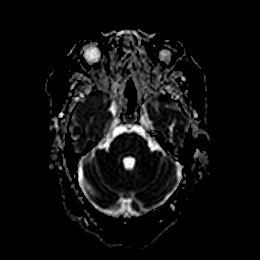
[im 38/57]
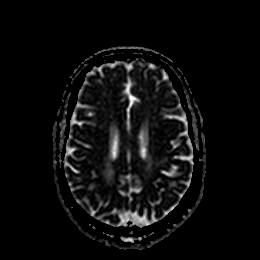
[im 57/57]
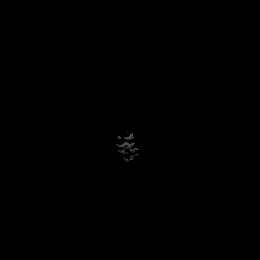

[Series 7: DWI · coronal · 4.0mm · 0.88mm/px · 6 of 86 slices shown (3 of 4)]
[im 1/86]
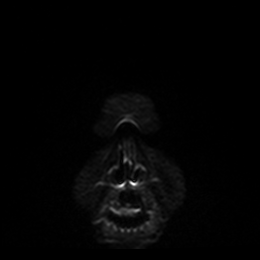
[im 18/86]
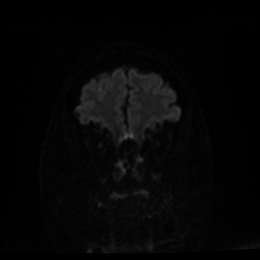
[im 35/86]
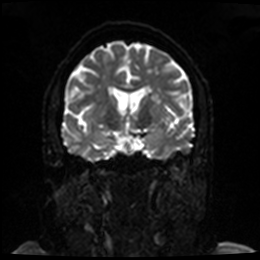
[im 52/86]
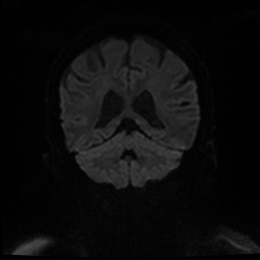
[im 69/86]
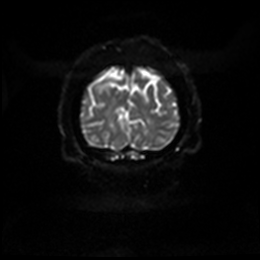
[im 86/86]
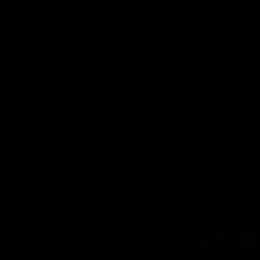

[Series 8: DWI · coronal · 4.0mm · 0.88mm/px · 3 of 42 slices shown (4 of 4)]
[im 1/42]
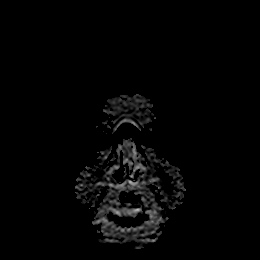
[im 21/42]
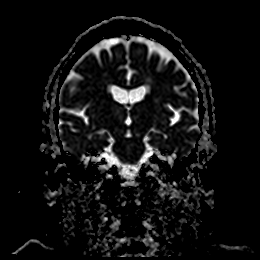
[im 42/42]
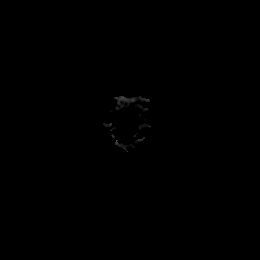

[Series 9: T1 · sagittal · 5.0mm · 0.78mm/px · 2 of 28 slices shown]
[im 1/28]
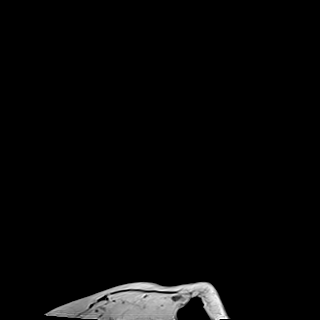
[im 28/28]
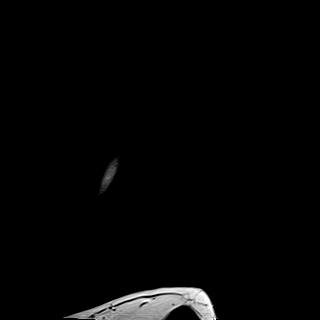

[Series 10: T2 · axial · 5.0mm · 0.78mm/px · z∈[-96,+64]mm · 2 of 29 slices shown (1 of 2)]
[im 1/29]
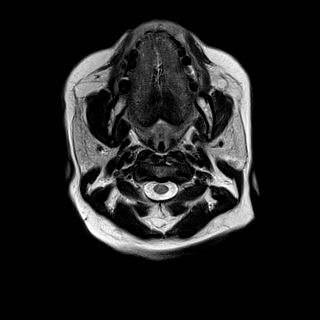
[im 29/29]
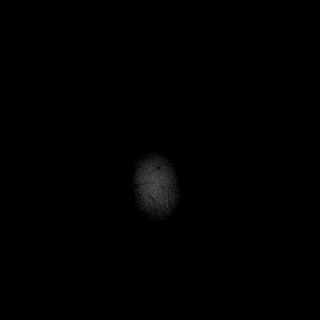

[Series 11: FLAIR · axial · 5.0mm · 0.49mm/px · z∈[-97,+63]mm · 2 of 29 slices shown]
[im 1/29]
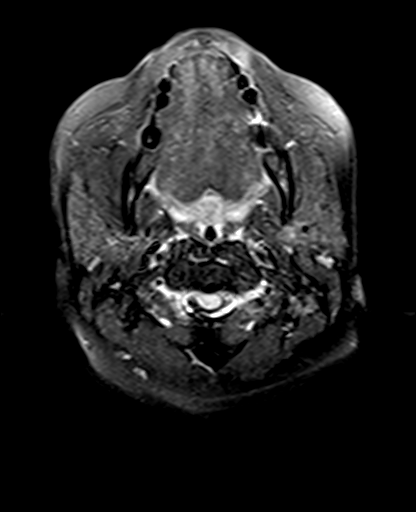
[im 29/29]
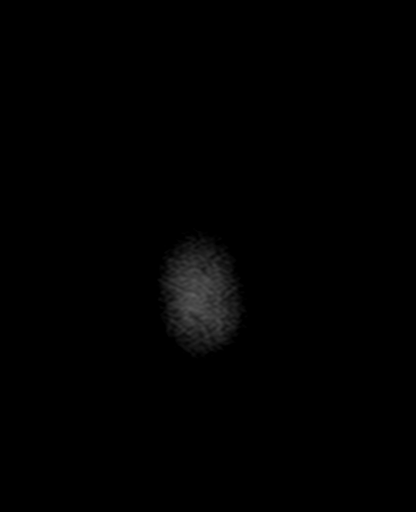

[Series 12: mag_images · axial · 3.0mm · 0.98mm/px · z∈[-101,+67]mm · 4 of 60 slices shown]
[im 1/60]
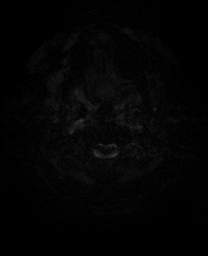
[im 20/60]
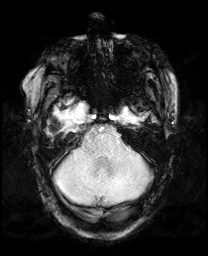
[im 40/60]
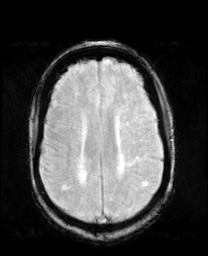
[im 60/60]
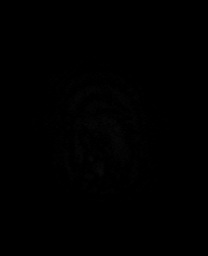

[Series 13: pha_images · axial · 3.0mm · 0.98mm/px · z∈[-101,+61]mm · 4 of 58 slices shown]
[im 1/58]
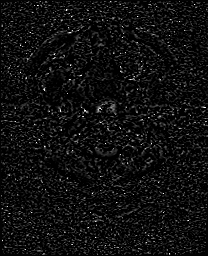
[im 20/58]
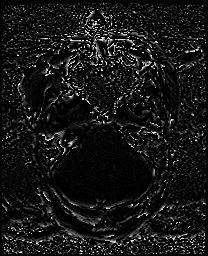
[im 39/58]
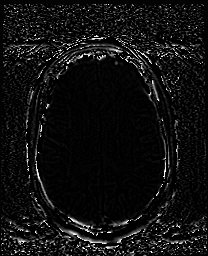
[im 58/58]
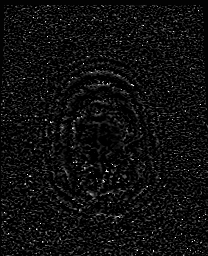

[Series 14: swi_images · axial · 3.0mm · 0.98mm/px · z∈[-101,+67]mm · 4 of 60 slices shown]
[im 1/60]
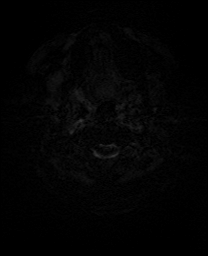
[im 20/60]
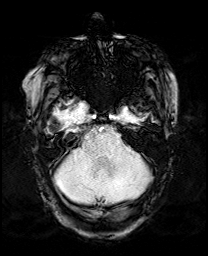
[im 40/60]
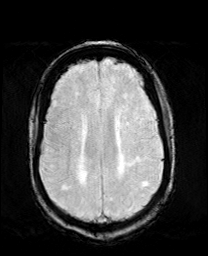
[im 60/60]
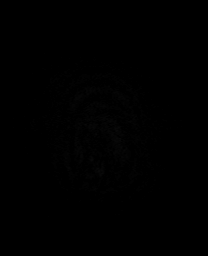

[Series 15: mip_images(sw) · axial · 24.0mm · 0.98mm/px · z∈[-91,+57]mm · 4 of 53 slices shown]
[im 1/53]
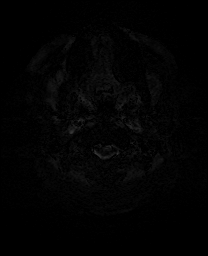
[im 18/53]
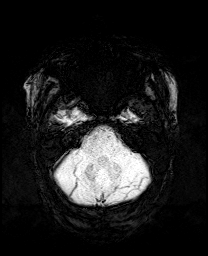
[im 35/53]
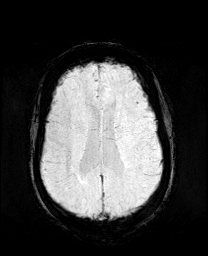
[im 53/53]
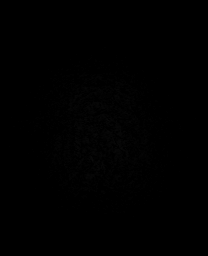

[Series 17: T2 · coronal · 5.0mm · 0.34mm/px · 2 of 36 slices shown (2 of 2)]
[im 1/36]
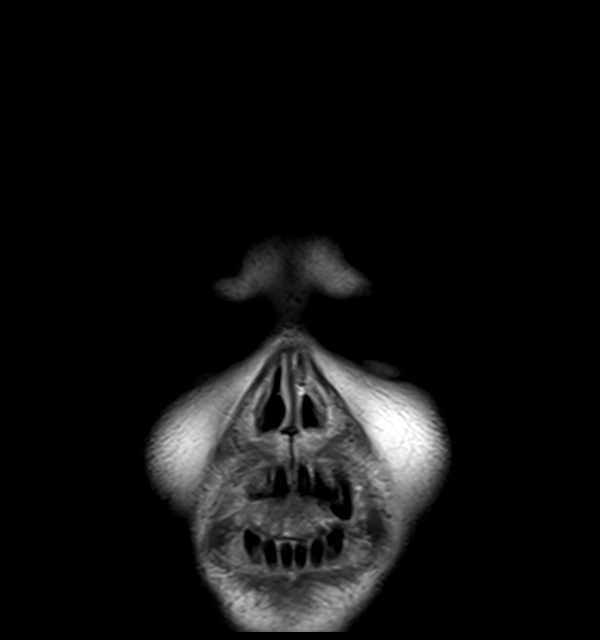
[im 36/36]
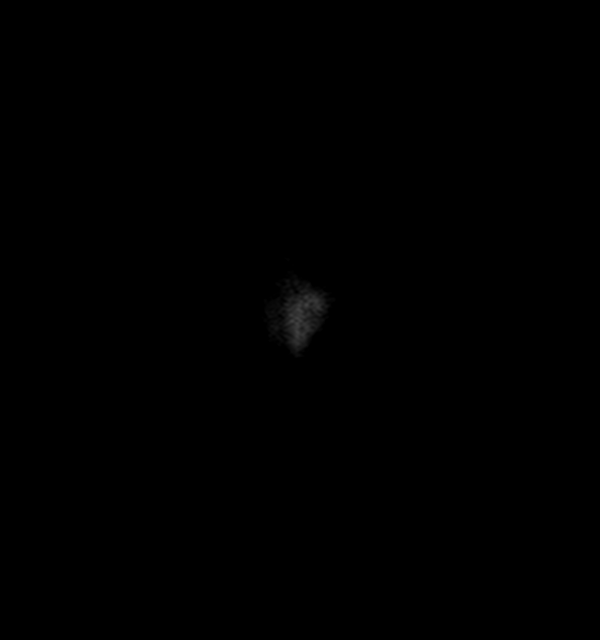

[44 of 48 positions shown; findings below may reference images not displayed]

FINDINGS: Brain: There are scattered small - generally punctate - foci of
diffusion restriction in both cerebral hemispheres. Most are in the
left hemisphere involving the left MCA and left PCA territory. Among
the most conspicuous is a 6 mm focus at the left external capsule
abutting the lentiform (series 5, image 87 and series 7, image 70).
Small involvement also at the right genu of the corpus callosum
(right ACA territory). Occasional right MCA involvement.

Subtle T2 and FLAIR hyperintensity associated with the acute
findings. With moderate superimposed widely scattered background
white matter T2 and FLAIR hyperintensity. And mild to moderate
associated heterogeneity in the bilateral basal ganglia.

But no cortical encephalomalacia identified. Trace if any chronic
cerebral blood products. Mild susceptibility artifact at the left
ICA terminus related to endovascular embolization.

No restricted diffusion to suggest acute infarction. No midline
shift, mass effect, evidence of mass lesion, ventriculomegaly,
extra-axial collection or acute intracranial hemorrhage.

Vascular: Major intracranial vascular flow voids are preserved.

Skull and upper cervical spine: Negative visible cervical spine.
Visualized bone marrow signal is within normal limits.

Sinuses/Orbits: Postoperative changes to the left globe. Otherwise
negative orbits. Paranasal Visualized paranasal sinuses and mastoids
are stable and well aerated.

Other: Mastoids are well aerated.  Negative visible scalp and face.
IMPRESSION: 1. Scattered, mostly punctate, small acute infarcts in the cerebral
hemispheres left > right. Among the most conspicuous is a 6 mm
lacune in the left external capsule abutting the lentiform. No
associated hemorrhage or mass effect.

2. Underlying moderate for age signal changes in the brain
compatible with chronic small vessel disease.

## 2021-07-21 IMAGING — CT CT HEAD W/O CM
3 of 4 series · 15 of 47 positions shown, 18 images · non-contrast
Comparison: MRI head [DATE].  CT head [DATE]

CLINICAL DATA: Stroke.  BASTIAN EDUARDO follow-up



[Series 3: head 5.0 h30s · axial · 0.51mm/px · z∈[-157,-22]mm · 9 of 33 slices shown, 12 images]
[im 3/33  brain]
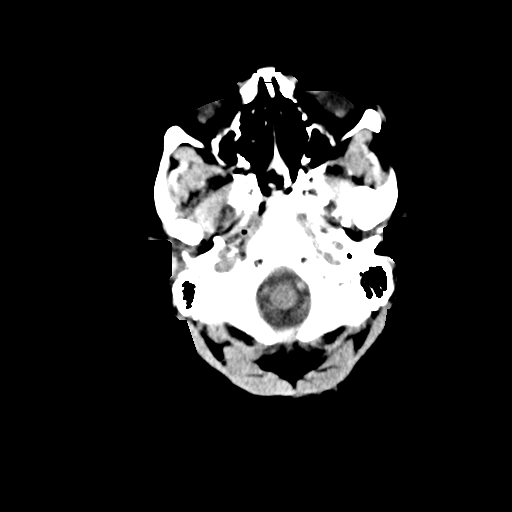
[im 3/33  bone]
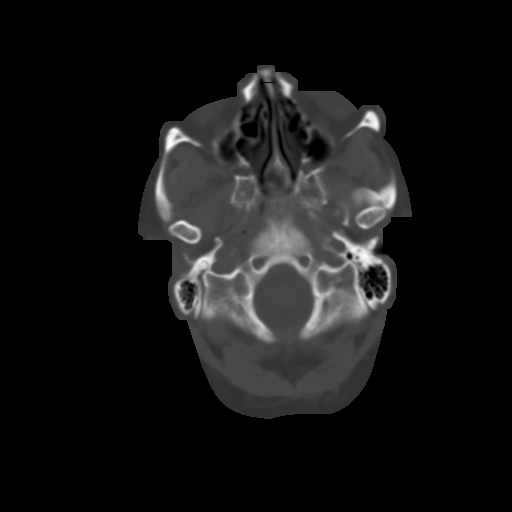
[im 7/33  brain]
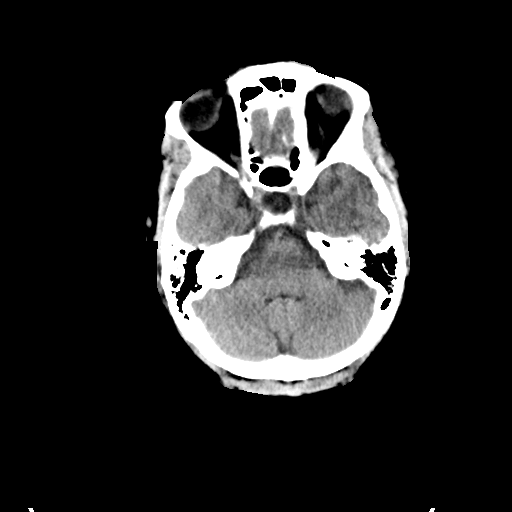
[im 10/33  brain]
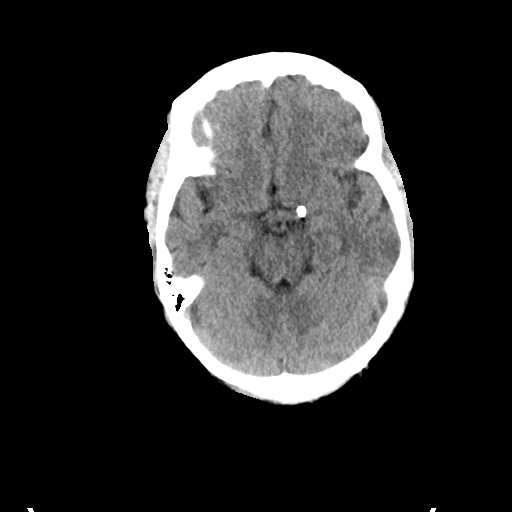
[im 14/33  brain]
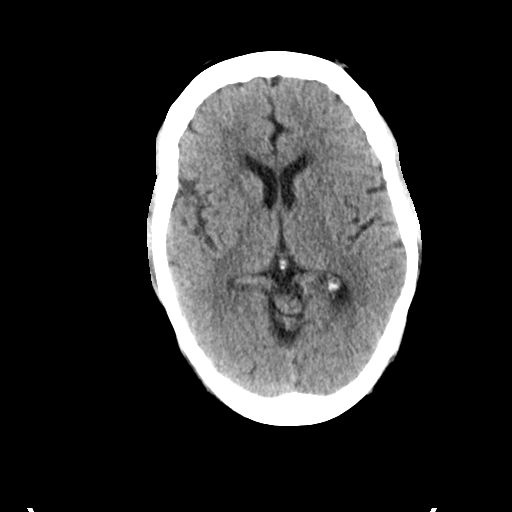
[im 17/33  brain]
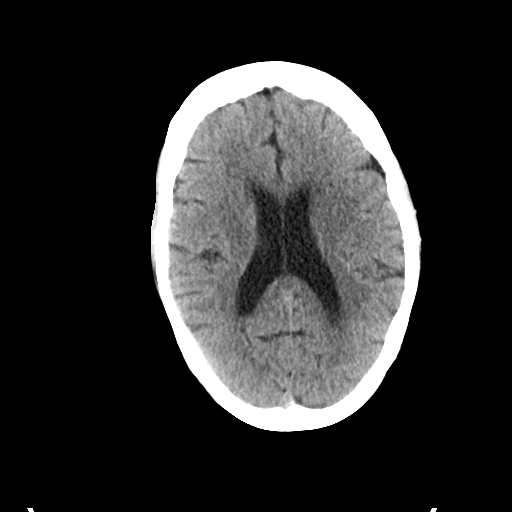
[im 17/33  bone]
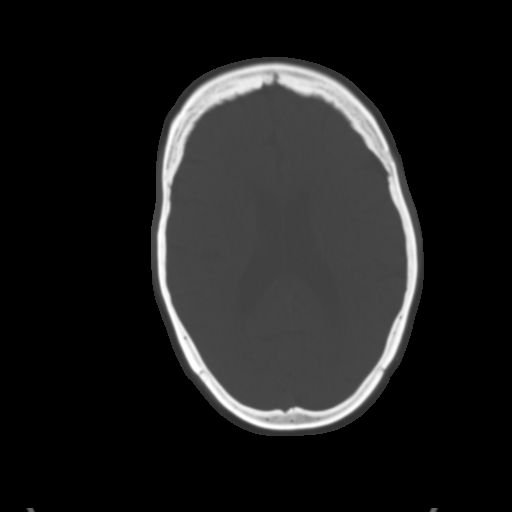
[im 19/33  brain]
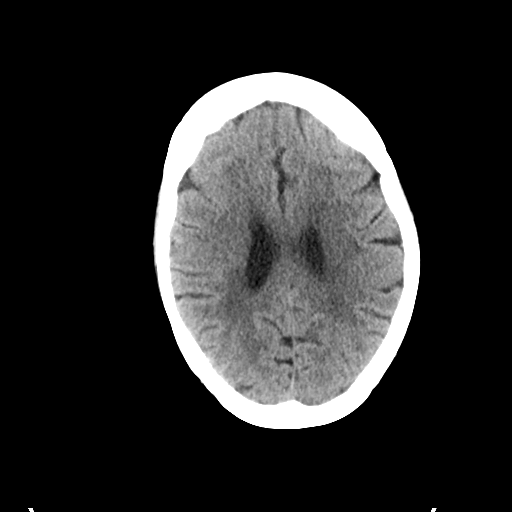
[im 23/33  brain]
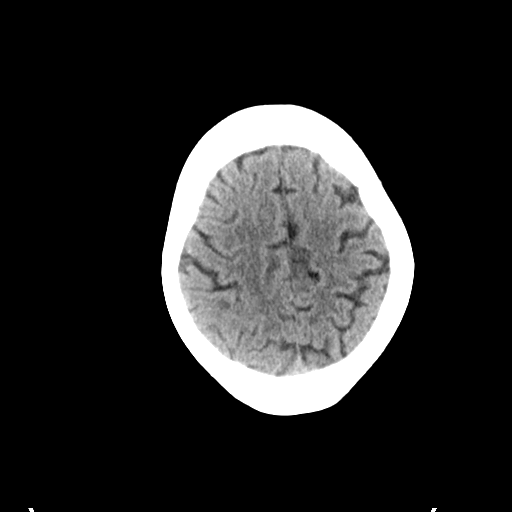
[im 26/33  brain]
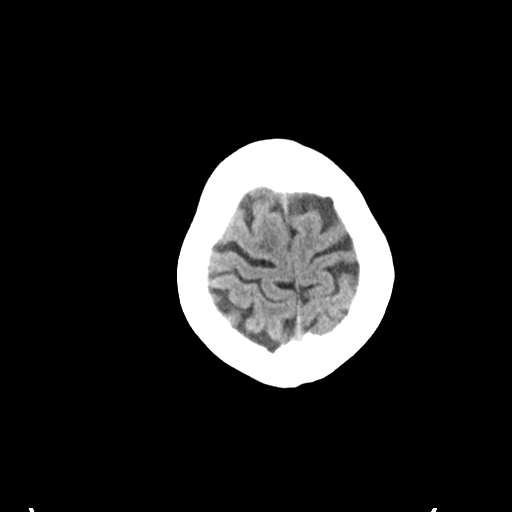
[im 30/33  brain]
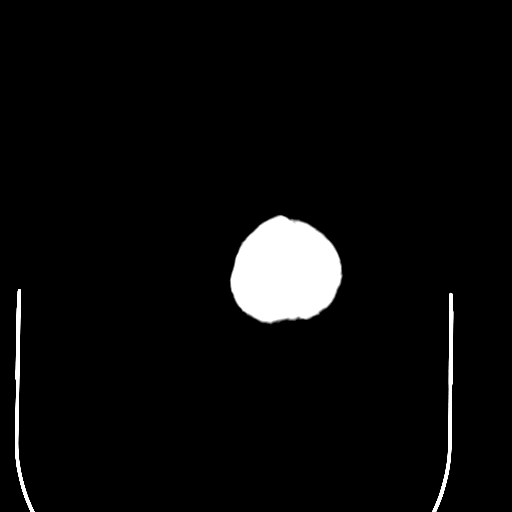
[im 30/33  bone]
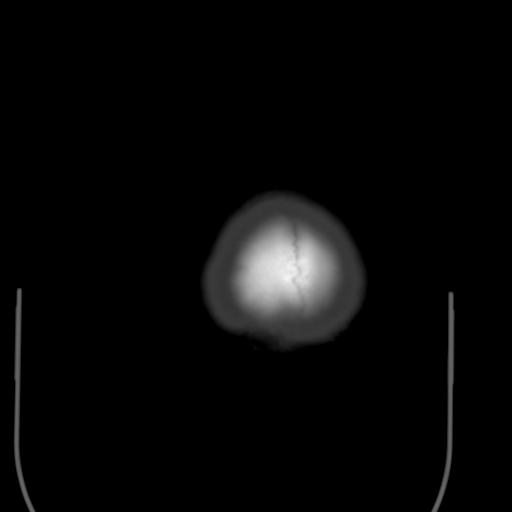

[Series 5: head 3.0 mpr cor · coronal · 0.33mm/px · 3 of 73 slices shown]
[im 25/73  brain]
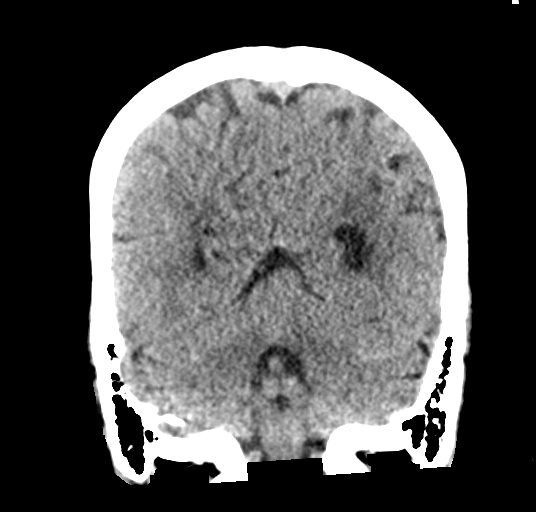
[im 33/73  brain]
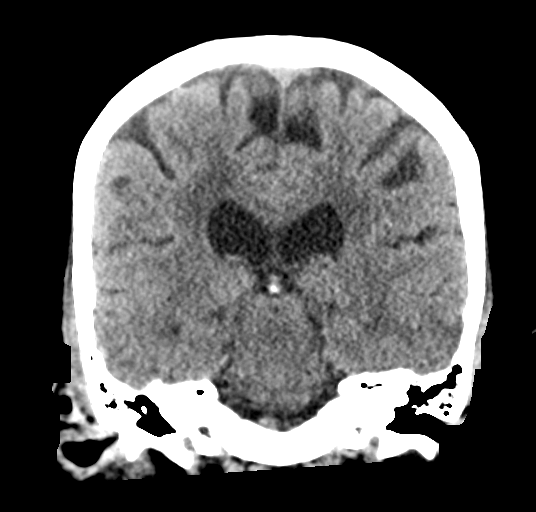
[im 41/73  brain]
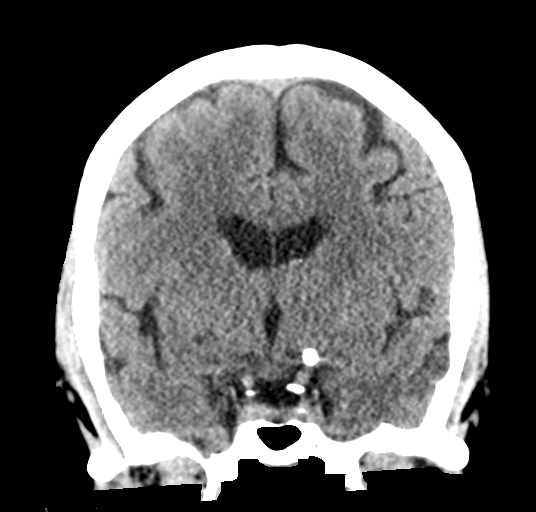

[Series 6: head 3.0 mpr sag · sagittal · 0.31mm/px · 3 of 60 slices shown]
[im 20/60  brain]
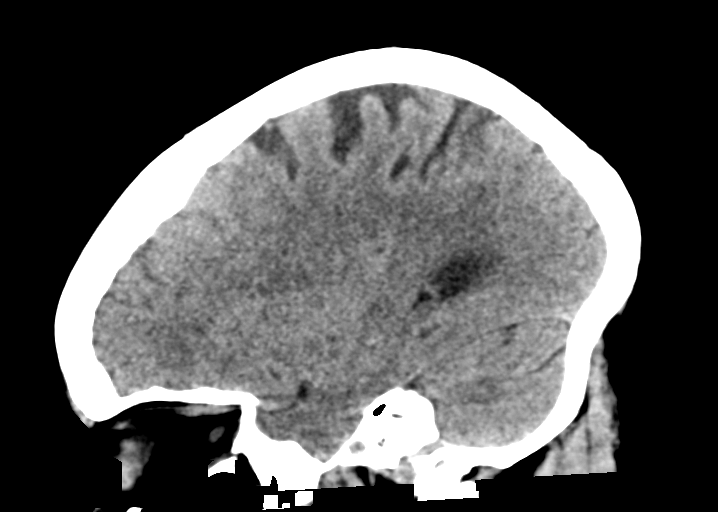
[im 30/60  brain]
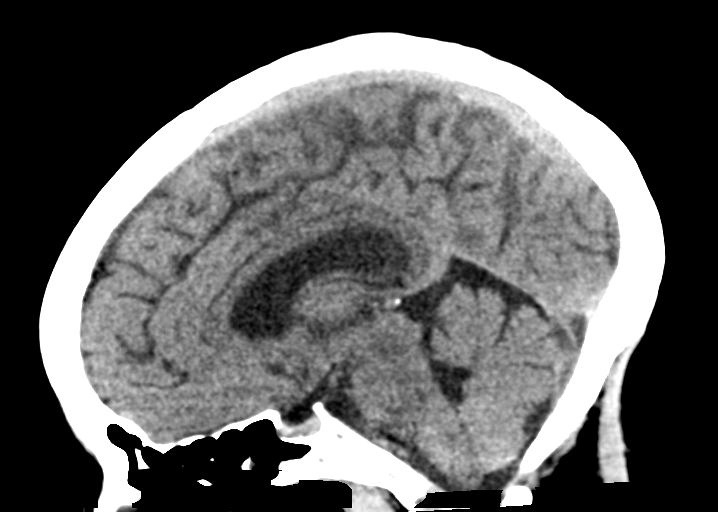
[im 40/60  brain]
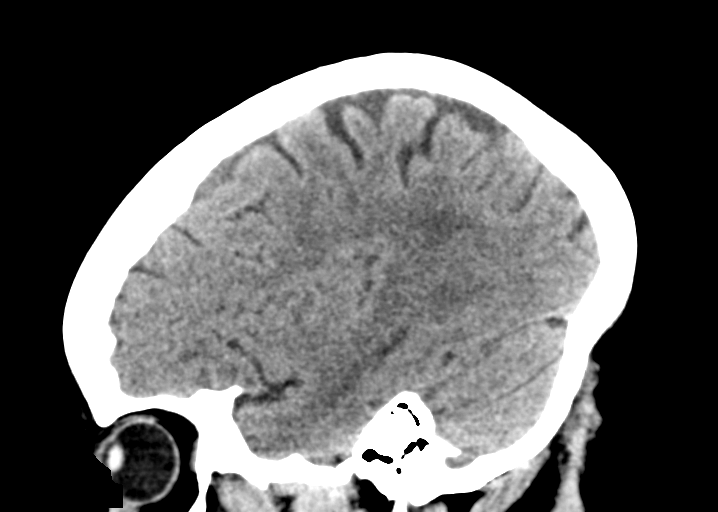

[15 of 47 positions shown; findings below may reference images not displayed]

FINDINGS: Brain: Negative for acute hemorrhage. Ventricle size normal. Patchy
white matter hypodensity bilaterally

Small areas of acute infarct identified on diffusion-weighted
imaging are not well delineated on CT.

Vascular: Negative for hyperdense vessel. Prior coiling of the
terminal left ICA aneurysm.

Skull: Negative

Sinuses/Orbits: Mild mucosal edema paranasal sinuses. No orbital
lesion. Left cataract extraction.

Other: None
IMPRESSION: 1. No acute intracranial hemorrhage
2. Diffuse white matter hypodensity compatible with ischemia. Small
areas of restricted diffusion best seen on MRI today.

## 2021-07-21 MED ORDER — PNEUMOCOCCAL VAC POLYVALENT 25 MCG/0.5ML IJ INJ
0.5000 mL | INJECTION | INTRAMUSCULAR | Status: DC
  Filled 2021-07-21: qty 0.5

## 2021-07-21 MED ORDER — INFLUENZA VAC SPLIT QUAD 0.5 ML IM SUSY: 0.5000 mL | PREFILLED_SYRINGE | INTRAMUSCULAR | Status: DC

## 2021-07-21 MED ORDER — ASPIRIN EC 81 MG PO TBEC
81.0000 mg | DELAYED_RELEASE_TABLET | Freq: Every day | ORAL | Status: DC
  Administered 2021-07-21 – 2021-07-23 (×3): 81 mg via ORAL
  Filled 2021-07-21 (×3): qty 1

## 2021-07-21 MED ORDER — PANTOPRAZOLE SODIUM 40 MG PO TBEC
40.0000 mg | DELAYED_RELEASE_TABLET | Freq: Every day | ORAL | Status: DC
  Administered 2021-07-21 – 2021-07-22 (×2): 40 mg via ORAL
  Filled 2021-07-21 (×2): qty 1

## 2021-07-21 NOTE — Progress Notes (Signed)
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 She denies any pain or discomfort. She recently worked with physical therapy.   Allergies: Canagliflozin and Hydrocodone  Medications: Prior to Admission medications   Medication Sig Start Date End Date Taking? Authorizing Provider  acetaminophen (TYLENOL) 500 MG tablet Take 1,000 mg by mouth every 4 (four) hours as needed for moderate pain.   Yes [provider]  amLODipine (NORVASC) 10 MG tablet TAKE 1 TABLET EVERY DAY ONCE A DAY ORALLY 90 03/13/18  Yes Elayne Snare, MD  aspirin EC 81 MG tablet Take 1 tablet (81 mg total) by mouth daily. Swallow whole. Start on 07/13/2020. 07/08/21  Yes de Sindy Messing, Erven Colla, MD  carvedilol (COREG) 25 MG tablet Take 37.5 mg by mouth 2 (two) times daily with a meal. 04/17/21 07/20/21 Yes [provider]  FARXIGA 5 MG TABS tablet TAKE 1 TABLET (5 MG TOTAL) BY MOUTH DAILY. 06/15/21  Yes Elayne Snare, MD  ibuprofen (ADVIL) 200 MG tablet Take 400 mg by mouth every 4 (four) hours as needed for moderate pain.   Yes [provider]  insulin aspart (FIASP) 100 UNIT/ML FlexTouch Pen Inject 8 Units into the skin 3 (three) times daily before meals. 07/09/21  Yes Elayne Snare, MD  losartan (COZAAR) 100 MG tablet Take 100 mg by mouth daily. 04/17/21  Yes [provider]  MELATONIN PO Take 1 capsule by mouth at bedtime as needed (sleep).   Yes [provider]  metFORMIN (GLUCOPHAGE) 1000 MG tablet Take 1,000 mg by mouth daily. 03/18/20  Yes [provider]  Multiple Vitamins-Calcium (ONE-A-DAY WOMENS PO) Take 1 tablet by mouth daily.   Yes [provider]  prednisoLONE acetate (PRED FORTE) 1 % ophthalmic suspension Place 1 drop into the  left eye daily. 06/13/21  Yes [provider]  promethazine (PHENERGAN) 12.5 MG tablet Take 12.5 mg by mouth every 6 (six) hours as needed for nausea or vomiting.   Yes [provider]  rosuvastatin (CRESTOR) 40 MG tablet Take 40 mg by mouth daily. 04/27/21  Yes [provider]  Sodium Chloride, Hypertonic, (MURO 588 OP) Place 1 application into the left eye at bedtime.   Yes [provider]  SODIUM CHLORIDE, HYPERTONIC, OP Place 1 drop into the left eye daily as needed (dry eyes/irritation).   Yes [provider]  spironolactone (ALDACTONE) 25 MG tablet Take 1 tablet by mouth daily. 04/17/21 07/20/21 Yes [provider]  ticagrelor (BRILINTA) 90 MG TABS tablet Take 1 tablet (90 mg total) by mouth 2 (two) times daily. Start on 07/13/2021 07/08/21  Yes de Sindy Messing, Erven Colla, MD  Accu-Chek Softclix Lancets lancets Use to check blood sugar twice daily. 04/27/21   Elayne Snare, MD  benzonatate (TESSALON) 100 MG capsule Take 1 capsule (100 mg total) by mouth every 8 (eight) hours. Patient not taking: Reported on 07/16/2021 07/02/21   Dorie Rank, MD  Continuous Blood Gluc Sensor (DEXCOM G6 SENSOR) MISC Use to monitor blood sugar, change after 10 days 03/30/21   Elayne Snare, MD  glucose blood (ACCU-CHEK GUIDE) test strip Use as instructed to check blood sugar twice daily. 04/27/21   Elayne Snare, MD  insulin glargine, 1 Unit Dial, (TOUJEO SOLOSTAR) 300 UNIT/ML Solostar Pen Adjust as directed 07/09/21  Elayne Snare, MD  Insulin Syringe-Needle U-100 30G X 1/2" 0.3 ML MISC Use twice a day with insulin 10/17/17   Elayne Snare, MD  oxyCODONE-acetaminophen (PERCOCET/ROXICET) 5-325 MG tablet Take 1 tablet by mouth every 6 (six) hours as needed for up to 5 doses for severe pain. Patient not taking: Reported on 07/09/2021 04/25/21   Luna Fuse, MD  potassium chloride SA (KLOR-CON M20) 20 MEQ tablet Take 1 tablet (20 mEq total) by mouth daily. Patient not taking:  Reported on 07/09/2021 03/30/21   Elayne Snare, MD     Vital Signs: BP 130/67    Pulse 69    Temp 98.2 F (36.8 C) (Oral)    Resp 16    Ht '5\' 6"'  (1.676 m)    Wt 231 lb (104.8 kg)    LMP 08/02/2013 Comment: spotting   SpO2 98%    BMI 37.28 kg/m   Physical Exam Constitutional:      General: She is not in acute distress. Cardiovascular:     Comments: Right hand/wrist swelling. Patient states it's getting better. Able to move hand/arm. Dressing clean and dry.  Pulmonary:     Effort: Pulmonary effort is normal.  Neurological:     Mental Status: She is alert and oriented to person, place, and time.     Comments: Left sided weakness - improving per patient report.     Imaging: MR BRAIN WO CONTRAST  Result Date: 07/21/2021 CLINICAL DATA:  61 year old female status post endovascular embolization of left ICA terminus aneurysm with web device. Neurologic deficit. EXAM: MRI HEAD WITHOUT CONTRAST TECHNIQUE: Multiplanar, multiecho pulse sequences of the brain and surrounding structures were obtained without intravenous contrast. COMPARISON:  CT head without contrast and CTA head and neck 07/20/2021. FINDINGS: Brain: There are scattered small - generally punctate - foci of diffusion restriction in both cerebral hemispheres. Most are in the left hemisphere involving the left MCA and left PCA territory. Among the most conspicuous is a 6 mm focus at the left external capsule abutting the lentiform (series 5, image 87 and series 7, image 70). Small involvement also at the right genu of the corpus callosum (right ACA territory). Occasional right MCA involvement. Subtle T2 and FLAIR hyperintensity associated with the acute findings. With moderate superimposed widely scattered background white matter T2 and FLAIR hyperintensity. And mild to moderate associated heterogeneity in the bilateral basal ganglia. But no cortical encephalomalacia identified. Trace if any chronic cerebral blood products. Mild susceptibility  artifact at the left ICA terminus related to endovascular embolization. No restricted diffusion to suggest acute infarction. No midline shift, mass effect, evidence of mass lesion, ventriculomegaly, extra-axial collection or acute intracranial hemorrhage. Vascular: Major intracranial vascular flow voids are preserved. Skull and upper cervical spine: Negative visible cervical spine. Visualized bone marrow signal is within normal limits. Sinuses/Orbits: Postoperative changes to the left globe. Otherwise negative orbits. Paranasal Visualized paranasal sinuses and mastoids are stable and well aerated. Other: Mastoids are well aerated.  Negative visible scalp and face. IMPRESSION: 1. 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 Among the most conspicuous is a 6 mm lacune in the left external capsule abutting the lentiform. No associated hemorrhage or mass effect. 2. Underlying moderate for age signal changes in the brain compatible with chronic small vessel disease. Electronically Signed   By: Genevie Ann M.D.   On: 07/21/2021 05:30   IR Transcath/Emboliz  Result Date: 07/20/2021 INDICATION: Dala Breault Dessert is a 61 year old female  with past medical history significant for diabetes, hyperlipidemia, hypertension and cataract. She presented to the emergency room with headache on 06/05/2021. 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 She underwent a diagnostic cerebral angiogram on 06/19/2021 that confirmed the presence of the 2 brain aneurysms seen on prior CTA. She comes to our service today for elective treatment of her left ICA aneurysm. In preparation to today's procedure, she was started on Brilinta 90 mg b.i.d. and aspirin 81 mg q.d. EXAM: ULTRASOUND-GUIDED VASCULAR ACCESS DIAGNOSTIC CEREBRAL ANGIOGRAM ENDOVASCULAR ANEURYSM EMBOLIZATION FLAT PANEL HEAD CT COMPARISON:  Cerebral angiogram  June 18, 2021. MEDICATIONS: Ancef 2 g IV. The antibiotic was administered within 1 hour of the procedure. ANESTHESIA/SEDATION: The study was performed in the general anesthesia. CONTRAST:  106 cc of Omnipaque 300 milligram/mL FLUOROSCOPY TIME:  Fluoroscopy Time: 42 minutes 48 seconds (1,843 mGy). COMPLICATIONS: Delayed - approximately one hour after procedure, patient developed left sided weakness, unclear whether procedure related given intervention in the left ICA. TECHNIQUE: Informed written consent was obtained from the patient after a thorough discussion of the procedural risks, benefits and alternatives. All questions were addressed. Maximal Sterile Barrier Technique was utilized including caps, mask, sterile gowns, sterile gloves, sterile drape, hand hygiene and skin antiseptic. A timeout was performed prior to the initiation of the procedure. Using the modified Seldinger technique and a micropuncture kit, access was gained to the distal right radial artery at the anatomical snuffbox and a 7 French sheath was placed. Real-time ultrasound guidance was utilized for vascular access including the acquisition of a permanent ultrasound image documenting patency of the accessed vessel. Slow intra arterial infusion of 5,000 IU heparin, 5 mg Verapamil and 200 mcg nitroglycerin diluted in patient's own blood was performed. No significant fluctuation in patient's blood pressure seen. Then, a right radial artery angiogram was obtained via sheath side port. Next, a 5 [place] Simmons 2 glide catheter was navigated over a 0.035" Terumo Glidewire into the right subclavian artery under fluoroscopic guidance. The catheter was then advanced into the left common carotid artery. Frontal and lateral angiograms of the neck were obtained. Using biplane roadmap guidance, the catheter was then advanced into the left internal carotid artery. Frontal, lateral, magnified waters and magnified lateral views of the head were obtained.  FINDINGS: 1. Normal brachial artery branching pattern seen. No significant anatomical variation. The right radial artery caliber is adequate for vascular access. 2. 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 3. There is brisk vascular contrast filling of the bilateral ACA and left MCA vascular trees. The visualized dural sinuses are patent. PROCEDURE: The Simmons 2 glide catheter was exchanged over the wire and under biplane roadmap for a 7 [place] Rist catheter which was placed in the distal cervical segment of the left ICA. Magnified frontal and lateral angiograms of the head were obtained in the working projections. Then, a [place] EX intermediate catheter was navigated through the risk catheter and over a via 17 microcatheter and a synchro 2 micro guidewire into the cavernous segment of the right ICA. Magnified frontal and lateral angiograms of the head were obtained in the working projections. The via microcatheter was then navigated over the wire into the left ICA terminus aneurysm pouch. The wire was removed. Then, a 4 x 2 mm web SL device was deployed into the aneurysm pouch. Frontal and lateral magnified angiograms of the  head were obtained in the working projections. Adequate positioning of the device was noted. The device was then detached under fluoroscopy. Follow-up left internal carotid artery angiograms with magnified frontal and lateral views of the head in the working projections showed stable position of the device within the aneurysm pouch. Left ICA angiograms with frontal and lateral views of the entire head were then obtained, showed no evidence of thromboembolic complication. Flat panel CT of the head was obtained and post processed in a separate workstation with concurrent attending physician supervision. Selected images were sent to PACS. No evidence of hemorrhagic complication noted. The catheter was subsequently withdrawn. An inflatable band was  placed and inflated over the right hand access site. The vascular sheath was withdrawn and the band was slowly deflated until brisk flow was noted through the arteriotomy site. At this point, the band was reinflated with additional 2 cc of air to obtain patent hemostasis. 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 No evidence of hemorrhagic of thromboembolic complication. PLAN: Patient will be admitted to ICU for observation. Electronically Signed   By: Pedro Earls M.D.   On: 07/20/2021 15:53   IR Angiogram Follow Up Study  Result Date: 07/20/2021 INDICATION: Shannon Oliver is a 61 year old female with past medical history significant for diabetes, hyperlipidemia, hypertension and cataract. She presented to the emergency room with headache on 06/05/2021. 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 She underwent a diagnostic cerebral angiogram on 06/19/2021 that confirmed the presence of the 2 brain aneurysms seen on prior CTA. She comes to our service today for elective treatment of her left ICA aneurysm. In preparation to today's procedure, she was started on Brilinta 90 mg b.i.d. and aspirin 81 mg q.d. EXAM: ULTRASOUND-GUIDED VASCULAR ACCESS DIAGNOSTIC CEREBRAL ANGIOGRAM ENDOVASCULAR ANEURYSM EMBOLIZATION FLAT PANEL HEAD CT COMPARISON:  Cerebral angiogram June 18, 2021. MEDICATIONS: Ancef 2 g IV. The antibiotic was administered within 1 hour of the procedure. ANESTHESIA/SEDATION: The study was performed in the general anesthesia. CONTRAST:  106 cc of Omnipaque 300 milligram/mL FLUOROSCOPY TIME:  Fluoroscopy Time: 42 minutes 48 seconds (1,843 mGy). COMPLICATIONS: Delayed - approximately one hour after procedure, patient developed left sided weakness, unclear whether procedure related given intervention in the left ICA. TECHNIQUE: Informed written consent  was obtained from the patient after a thorough discussion of the procedural risks, benefits and alternatives. All questions were addressed. Maximal Sterile Barrier Technique was utilized including caps, mask, sterile gowns, sterile gloves, sterile drape, hand hygiene and skin antiseptic. A timeout was performed prior to the initiation of the procedure. Using the modified Seldinger technique and a micropuncture kit, access was gained to the distal right radial artery at the anatomical snuffbox and a 7 French sheath was placed. Real-time ultrasound guidance was utilized for vascular access including the acquisition of a permanent ultrasound image documenting patency of the accessed vessel. Slow intra arterial infusion of 5,000 IU heparin, 5 mg Verapamil and 200 mcg nitroglycerin diluted in patient's own blood was performed. No significant fluctuation in patient's blood pressure seen. Then, a right radial artery angiogram was obtained via sheath side port. Next, a 5 [place] Simmons 2 glide catheter was navigated over a 0.035" Terumo Glidewire into the right subclavian artery under fluoroscopic guidance. The catheter was then advanced into the left common carotid artery. Frontal and lateral angiograms of the neck were  obtained. Using biplane roadmap guidance, the catheter was then advanced into the left internal carotid artery. Frontal, lateral, magnified waters and magnified lateral views of the head were obtained. FINDINGS: 1. Normal brachial artery branching pattern seen. No significant anatomical variation. The right radial artery caliber is adequate for vascular access. 2. 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 3. There is brisk vascular contrast filling of the bilateral ACA and left MCA vascular trees. The visualized dural sinuses are patent. PROCEDURE: The Simmons 2 glide catheter was exchanged over the wire and under biplane roadmap for a 7 [place] Rist catheter  which was placed in the distal cervical segment of the left ICA. Magnified frontal and lateral angiograms of the head were obtained in the working projections. Then, a [place] EX intermediate catheter was navigated through the risk catheter and over a via 17 microcatheter and a synchro 2 micro guidewire into the cavernous segment of the right ICA. Magnified frontal and lateral angiograms of the head were obtained in the working projections. The via microcatheter was then navigated over the wire into the left ICA terminus aneurysm pouch. The wire was removed. Then, a 4 x 2 mm web SL device was deployed into the aneurysm pouch. Frontal and lateral magnified angiograms of the head were obtained in the working projections. Adequate positioning of the device was noted. The device was then detached under fluoroscopy. Follow-up left internal carotid artery angiograms with magnified frontal and lateral views of the head in the working projections showed stable position of the device within the aneurysm pouch. Left ICA angiograms with frontal and lateral views of the entire head were then obtained, showed no evidence of thromboembolic complication. Flat panel CT of the head was obtained and post processed in a separate workstation with concurrent attending physician supervision. Selected images were sent to PACS. No evidence of hemorrhagic complication noted. The catheter was subsequently withdrawn. An inflatable band was placed and inflated over the right hand access site. The vascular sheath was withdrawn and the band was slowly deflated until brisk flow was noted through the arteriotomy site. At this point, the band was reinflated with additional 2 cc of air to obtain patent hemostasis. 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 No evidence of hemorrhagic of thromboembolic complication. PLAN: Patient will be admitted to ICU for observation. Electronically Signed   By:  Pedro Earls M.D.   On: 07/20/2021 15:53   IR CT Head Ltd  Result Date: 07/20/2021 INDICATION: Shannon Oliver is a 61 year old female with past medical history significant for diabetes, hyperlipidemia, hypertension and cataract. She presented to the emergency room with headache on 06/05/2021. 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 She underwent a diagnostic cerebral angiogram on 06/19/2021 that confirmed the presence of the 2 brain aneurysms seen on prior CTA. She comes to our service today for elective treatment of her left ICA aneurysm. In preparation to today's procedure, she was started on Brilinta 90 mg b.i.d. and aspirin 81 mg q.d. EXAM: ULTRASOUND-GUIDED VASCULAR ACCESS DIAGNOSTIC CEREBRAL ANGIOGRAM ENDOVASCULAR ANEURYSM EMBOLIZATION FLAT PANEL HEAD CT COMPARISON:  Cerebral angiogram June 18, 2021. MEDICATIONS: Ancef 2 g IV. The antibiotic was administered within 1 hour of the procedure. ANESTHESIA/SEDATION: The study was performed in the general anesthesia. CONTRAST:  106 cc of Omnipaque 300 milligram/mL  FLUOROSCOPY TIME:  Fluoroscopy Time: 42 minutes 48 seconds (1,843 mGy). COMPLICATIONS: Delayed - approximately one hour after procedure, patient developed left sided weakness, unclear whether procedure related given intervention in the left ICA. TECHNIQUE: Informed written consent was obtained from the patient after a thorough discussion of the procedural risks, benefits and alternatives. All questions were addressed. Maximal Sterile Barrier Technique was utilized including caps, mask, sterile gowns, sterile gloves, sterile drape, hand hygiene and skin antiseptic. A timeout was performed prior to the initiation of the procedure. Using the modified Seldinger technique and a micropuncture kit, access was gained to the distal right radial artery at the anatomical snuffbox and a 7 French sheath was  placed. Real-time ultrasound guidance was utilized for vascular access including the acquisition of a permanent ultrasound image documenting patency of the accessed vessel. Slow intra arterial infusion of 5,000 IU heparin, 5 mg Verapamil and 200 mcg nitroglycerin diluted in patient's own blood was performed. No significant fluctuation in patient's blood pressure seen. Then, a right radial artery angiogram was obtained via sheath side port. Next, a 5 [place] Simmons 2 glide catheter was navigated over a 0.035" Terumo Glidewire into the right subclavian artery under fluoroscopic guidance. The catheter was then advanced into the left common carotid artery. Frontal and lateral angiograms of the neck were obtained. Using biplane roadmap guidance, the catheter was then advanced into the left internal carotid artery. Frontal, lateral, magnified waters and magnified lateral views of the head were obtained. FINDINGS: 1. Normal brachial artery branching pattern seen. No significant anatomical variation. The right radial artery caliber is adequate for vascular access. 2. 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 3. There is brisk vascular contrast filling of the bilateral ACA and left MCA vascular trees. The visualized dural sinuses are patent. PROCEDURE: The Simmons 2 glide catheter was exchanged over the wire and under biplane roadmap for a 7 [place] Rist catheter which was placed in the distal cervical segment of the left ICA. Magnified frontal and lateral angiograms of the head were obtained in the working projections. Then, a [place] EX intermediate catheter was navigated through the risk catheter and over a via 17 microcatheter and a synchro 2 micro guidewire into the cavernous segment of the right ICA. Magnified frontal and lateral angiograms of the head were obtained in the working projections. The via microcatheter was then navigated over the wire into the left ICA  terminus aneurysm pouch. The wire was removed. Then, a 4 x 2 mm web SL device was deployed into the aneurysm pouch. Frontal and lateral magnified angiograms of the head were obtained in the working projections. Adequate positioning of the device was noted. The device was then detached under fluoroscopy. Follow-up left internal carotid artery angiograms with magnified frontal and lateral views of the head in the working projections showed stable position of the device within the aneurysm pouch. Left ICA angiograms with frontal and lateral views of the entire head were then obtained, showed no evidence of thromboembolic complication. Flat panel CT of the head was obtained and post processed in a separate workstation with concurrent attending physician supervision. Selected images were sent to PACS. No evidence of hemorrhagic complication noted. The catheter was subsequently withdrawn. An inflatable band was placed and inflated over the right hand access site. The vascular sheath was withdrawn and the band was slowly deflated until brisk flow was noted through the arteriotomy site. At this point, the band was reinflated with  additional 2 cc of air to obtain patent hemostasis. 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 No evidence of hemorrhagic of thromboembolic complication. PLAN: Patient will be admitted to ICU for observation. Electronically Signed   By: Pedro Earls M.D.   On: 07/20/2021 15:53   ECHOCARDIOGRAM COMPLETE  Result Date: 07/20/2021    ECHOCARDIOGRAM REPORT   Patient Name:   Shannon Oliver Date of Exam: 07/20/2021 Medical Rec #:  [id number]        Height:       66.0 in Accession #:    [id number]       Weight:       231.0 lb Date of Birth:  Sep 16, 1960        BSA:          2.126 m Patient Age:    61 years         BP:           138/80 mmHg Patient Gender: F                HR:           66 bpm. Exam Location:  Inpatient Procedure:  2D Echo Indications:    Stroke  History:        Patient has prior history of Echocardiogram examinations, most                 recent 10/30/2020. Abnormal ECG; Risk Factors:Diabetes,                 Hypertension and Dyslipidemia.  Sonographer:    Arlyss Gandy Referring Phys: [id number] [place]  1. Left ventricular ejection fraction, by estimation, is 65 to 70%. Left ventricular ejection fraction by PLAX is 66 %. The left ventricle has normal function. The left ventricle has no regional wall motion abnormalities. There is moderate asymmetric left ventricular hypertrophy of the basal-septal segment. Left ventricular diastolic parameters are consistent with Grade I diastolic dysfunction (impaired relaxation).  2. Right ventricular systolic function is normal. The right ventricular size is normal.  3. The mitral valve is grossly normal. Trivial mitral valve regurgitation.  4. The aortic valve is tricuspid. Aortic valve regurgitation is not visualized. Aortic valve sclerosis/calcification is present, without any evidence of aortic stenosis.  5. The inferior vena cava is normal in size with greater than 50% respiratory variability, suggesting right atrial pressure of 3 mmHg. Comparison(s): No prior Echocardiogram. 10/30/2020: LVEF 70-75%. FINDINGS  Left Ventricle: Left ventricular ejection fraction, by estimation, is 65 to 70%. Left ventricular ejection fraction by PLAX is 66 %. The left ventricle has normal function. The left ventricle has no regional wall motion abnormalities. The left ventricular internal cavity size was normal in size. There is moderate asymmetric left ventricular hypertrophy of the basal-septal segment. Left ventricular diastolic parameters are consistent with Grade I diastolic dysfunction (impaired relaxation). Indeterminate filling pressures. Right Ventricle: The right ventricular size is normal. No increase in right ventricular wall thickness. Right ventricular systolic function  is normal. Left Atrium: Left atrial size was normal in size. Right Atrium: Right atrial size was normal in size. Pericardium: There is no evidence of pericardial effusion. Mitral Valve: The mitral valve is grossly normal. Trivial mitral valve regurgitation. Tricuspid Valve: The tricuspid valve is grossly normal. Tricuspid valve regurgitation is trivial. Aortic Valve: The aortic valve is tricuspid. Aortic valve regurgitation is not visualized. Aortic valve sclerosis/calcification is present, without any evidence of  aortic stenosis. Aortic valve mean gradient measures 7.0 mmHg. Aortic valve peak gradient measures 9.7 mmHg. Aortic valve area, by VTI measures 2.70 cm. Pulmonic Valve: The pulmonic valve was grossly normal. Pulmonic valve regurgitation is trivial. Aorta: The aortic root and ascending aorta are structurally normal, with no evidence of dilitation. Venous: The inferior vena cava is normal in size with greater than 50% respiratory variability, suggesting right atrial pressure of 3 mmHg. IAS/Shunts: No atrial level shunt detected by color flow Doppler.  LEFT VENTRICLE PLAX 2D LV EF:         Left            Diastology                ventricular     LV e' medial:    5.87 cm/s                ejection        LV E/e' medial:  12.0                fraction by     LV e' lateral:   8.70 cm/s                PLAX is 66      LV E/e' lateral: 8.1                %. LVIDd:         3.87 cm LVIDs:         2.49 cm LV PW:         1.22 cm LV IVS:        1.55 cm LVOT diam:     2.00 cm LV SV:         108 LV SV Index:   51 LVOT Area:     3.14 cm  RIGHT VENTRICLE RV S prime:     16.80 cm/s TAPSE (M-mode): 2.3 cm LEFT ATRIUM             Index LA diam:        2.70 cm 1.27 cm/m LA Vol (A2C):   55.4 ml 26.06 ml/m LA Vol (A4C):   34.1 ml 16.04 ml/m LA Biplane Vol: 44.3 ml 20.84 ml/m  AORTIC VALVE AV Area (Vmax):    2.94 cm AV Area (Vmean):   2.56 cm AV Area (VTI):     2.70 cm AV Vmax:           156.00 cm/s AV Vmean:           125.000 cm/s AV VTI:            0.399 m AV Peak Grad:      9.7 mmHg AV Mean Grad:      7.0 mmHg LVOT Vmax:         146.00 cm/s LVOT Vmean:        102.000 cm/s LVOT VTI:          0.343 m LVOT/AV VTI ratio: 0.86  AORTA Ao Root diam: 3.20 cm Ao Asc diam:  3.30 cm MITRAL VALVE MV Area (PHT): 2.12 cm     SHUNTS MV Decel Time: 357 msec     Systemic VTI:  0.34 m MV E velocity: 70.30 cm/s   Systemic Diam: 2.00 cm MV A velocity: 103.00 cm/s MV E/A ratio:  0.68 Lyman Bishop MD Electronically signed by Lyman Bishop MD Signature Date/Time: 07/20/2021/3:41:45 PM    Final    CT HEAD CODE STROKE  WO CONTRAST`  Result Date: 07/20/2021 CLINICAL DATA:  Code stroke. EXAM: CT HEAD WITHOUT CONTRAST TECHNIQUE: Contiguous axial images were obtained from the base of the skull through the vertex without intravenous contrast. RADIATION DOSE REDUCTION: This exam was performed according to the departmental dose-optimization program which includes automated exposure control, adjustment of the mA and/or kV according to patient size and/or use of iterative reconstruction technique. COMPARISON:  06/08/2021 FINDINGS: Brain: No evidence of acute infarction, hemorrhage, cerebral edema, mass, mass effect, or midline shift. Ventricles and sulci are normal for age. No extra-axial fluid collection. Periventricular white matter changes, likely the sequela of chronic small vessel ischemic disease. Vascular: No hyperdense vessel or unexpected calcification. Skull: Normal. Negative for fracture or focal lesion. Sinuses/Orbits: Mucosal thickening in the maxillary sinuses. Status post left lens replacement. Other: The mastoid air cells are well aerated. ASPECTS [facility] Stroke Program Early CT Score) - Ganglionic level infarction (caudate, lentiform nuclei, internal capsule, insula, M1-M3 cortex): 7 - Supraganglionic infarction (M4-M6 cortex): 3 Total score (0-10 with 10 being normal): 10 IMPRESSION: 1. No acute intracranial process. 2. ASPECTS is 10 Code  stroke imaging results were communicated on 07/20/2021 at 11:56 am to provider Dr. Lorrin Goodell via secure text paging. Electronically Signed   By: Merilyn Baba M.D.   On: 07/20/2021 11:57   CT ANGIO HEAD NECK W WO CM (CODE STROKE)  Result Date: 07/20/2021 CLINICAL DATA:  Neuro deficit, stroke suspected EXAM: CT ANGIOGRAPHY HEAD AND NECK TECHNIQUE: Multidetector CT imaging of the head and neck was performed using the standard protocol during bolus administration of intravenous contrast. Multiplanar CT image reconstructions and MIPs were obtained to evaluate the vascular anatomy. Carotid stenosis measurements (when applicable) are obtained utilizing NASCET criteria, using the distal internal carotid diameter as the denominator. RADIATION DOSE REDUCTION: This exam was performed according to the departmental dose-optimization program which includes automated exposure control, adjustment of the mA and/or kV according to patient size and/or use of iterative reconstruction technique. CONTRAST:  71m OMNIPAQUE IOHEXOL 350 MG/ML SOLN COMPARISON:  06/05/2021 CTA head, correlation is also made with same day CT head FINDINGS: CT HEAD FINDINGS For noncontrast findings, please see same day CT head. CTA NECK FINDINGS Aortic arch: Two-vessel arch with a common origin of the brachiocephalic and left common carotid arteries. Imaged portion shows no evidence of aneurysm or dissection. No significant stenosis of the major arch vessel origins. Right carotid system: No evidence of dissection, stenosis (50% or greater) or occlusion. Calcified and noncalcified plaque at the bifurcation, which is not hemodynamically significant. Left carotid system: No evidence of dissection, stenosis (50% or greater) or occlusion. Calcified and noncalcified plaque at the bifurcation which is not hemodynamically significant. Vertebral arteries: Codominant. No evidence of dissection, stenosis (50% or greater) or occlusion. Skeleton: No acute osseous  abnormality. Other neck: Negative. Upper chest: No focal pulmonary opacity or pleural effusion. Review of the MIP images confirms the above findings CTA HEAD FINDINGS Anterior circulation: Both internal carotid arteries are patent to the termini. Unchanged 3 mm supero medially projecting aneurysm arising from the distal cavernous right ICA (series 7, image 244). Interval coiling of the previously noted superiorly projecting aneurysm arising from the left ICA terminus at the origin of the left ACA and MCA (series 7, image 224). A1 segments patent, with redemonstrated hypoplastic right A1. Normal anterior communicating artery. Anterior cerebral arteries are patent to their distal aspects. No M1 stenosis or occlusion. Normal MCA bifurcations. Distal MCA branches perfused and symmetric. Posterior circulation: Vertebral arteries  patent to the vertebrobasilar junction without stenosis. Posterior inferior cerebral arteries patent bilaterally. Basilar patent to its distal aspect. Superior cerebellar arteries patent bilaterally. Bilateral P1 segments originate from the basilar artery. PCAs perfused to their distal aspects without stenosis. The bilateral posterior communicating arteries are patent. Venous sinuses: As permitted by contrast timing, patent. Anatomic variants: None significant Review of the MIP images confirms the above findings IMPRESSION: 1. 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 2.  No intracranial large vessel occlusion or significant stenosis. 3.  No hemodynamically significant stenosis in the neck. 4. Unchanged 3 mm aneurysm projecting from the distal cavernous right ICA. Code stroke imaging results were communicated on 07/20/2021 at 12:39 pm to provider Dr. Lorrin Goodell via secure text paging. Electronically Signed   By: Merilyn Baba M.D.   On: 07/20/2021 12:40   IR ANGIO INTRA EXTRACRAN SEL INTERNAL CAROTID UNI R MOD SED  Result Date: 07/20/2021 INDICATION: Shannon [facility] is a  61 year old female with past medical history significant for diabetes, hyperlipidemia, hypertension and cataract. She presented to the emergency room with headache on 06/05/2021. 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 She underwent a diagnostic cerebral angiogram on 06/19/2021 that confirmed the presence of the 2 brain aneurysms seen on prior CTA. She comes to our service today for elective treatment of her left ICA aneurysm. In preparation to today's procedure, she was started on Brilinta 90 mg b.i.d. and aspirin 81 mg q.d. EXAM: ULTRASOUND-GUIDED VASCULAR ACCESS DIAGNOSTIC CEREBRAL ANGIOGRAM ENDOVASCULAR ANEURYSM EMBOLIZATION FLAT PANEL HEAD CT COMPARISON:  Cerebral angiogram June 18, 2021. MEDICATIONS: Ancef 2 g IV. The antibiotic was administered within 1 hour of the procedure. ANESTHESIA/SEDATION: The study was performed in the general anesthesia. CONTRAST:  106 cc of Omnipaque 300 milligram/mL FLUOROSCOPY TIME:  Fluoroscopy Time: 42 minutes 48 seconds (1,843 mGy). COMPLICATIONS: Delayed - approximately one hour after procedure, patient developed left sided weakness, unclear whether procedure related given intervention in the left ICA. TECHNIQUE: Informed written consent was obtained from the patient after a thorough discussion of the procedural risks, benefits and alternatives. All questions were addressed. Maximal Sterile Barrier Technique was utilized including caps, mask, sterile gowns, sterile gloves, sterile drape, hand hygiene and skin antiseptic. A timeout was performed prior to the initiation of the procedure. Using the modified Seldinger technique and a micropuncture kit, access was gained to the distal right radial artery at the anatomical snuffbox and a 7 French sheath was placed. Real-time ultrasound guidance was utilized for vascular access including the acquisition of a permanent ultrasound image documenting  patency of the accessed vessel. Slow intra arterial infusion of 5,000 IU heparin, 5 mg Verapamil and 200 mcg nitroglycerin diluted in patient's own blood was performed. No significant fluctuation in patient's blood pressure seen. Then, a right radial artery angiogram was obtained via sheath side port. Next, a 5 [place] Simmons 2 glide catheter was navigated over a 0.035" Terumo Glidewire into the right subclavian artery under fluoroscopic guidance. The catheter was then advanced into the left common carotid artery. Frontal and lateral angiograms of the neck were obtained. Using biplane roadmap guidance, the catheter was then advanced into the left internal carotid artery. Frontal, lateral, magnified waters and magnified lateral views of the head were obtained. FINDINGS: 1. Normal brachial artery branching pattern seen. No significant anatomical variation. The right radial artery caliber is adequate for vascular access. 2. There is  a 3.1 X 2.7 mm saccular aneurysm projecting superiorly from the left ICA terminus. 3. There is brisk vascular contrast filling of the bilateral ACA and left MCA vascular trees. The visualized dural sinuses are patent. PROCEDURE: The Simmons 2 glide catheter was exchanged over the wire and under biplane roadmap for a 7 [place] Rist catheter which was placed in the distal cervical segment of the left ICA. Magnified frontal and lateral angiograms of the head were obtained in the working projections. Then, a [place] EX intermediate catheter was navigated through the risk catheter and over a via 17 microcatheter and a synchro 2 micro guidewire into the cavernous segment of the right ICA. Magnified frontal and lateral angiograms of the head were obtained in the working projections. The via microcatheter was then navigated over the wire into the left ICA terminus aneurysm pouch. The wire was removed. Then, a 4 x 2 mm web SL device was deployed into the aneurysm pouch. Frontal and lateral magnified  angiograms of the head were obtained in the working projections. Adequate positioning of the device was noted. The device was then detached under fluoroscopy. Follow-up left internal carotid artery angiograms with magnified frontal and lateral views of the head in the working projections showed stable position of the device within the aneurysm pouch. Left ICA angiograms with frontal and lateral views of the entire head were then obtained, showed no evidence of thromboembolic complication. Flat panel CT of the head was obtained and post processed in a separate workstation with concurrent attending physician supervision. Selected images were sent to PACS. No evidence of hemorrhagic complication noted. The catheter was subsequently withdrawn. An inflatable band was placed and inflated over the right hand access site. The vascular sheath was withdrawn and the band was slowly deflated until brisk flow was noted through the arteriotomy site. At this point, the band was reinflated with additional 2 cc of air to obtain patent hemostasis. 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 No evidence of hemorrhagic of thromboembolic complication. PLAN: Patient will be admitted to ICU for observation. Electronically Signed   By: Pedro Earls M.D.   On: 07/20/2021 15:53    Labs:  CBC: Recent Labs    06/05/21 0720 06/08/21 0829 06/18/21 0950 07/20/21 0642  WBC 5.0 5.5 5.2 6.0  HGB 11.4* 12.7 12.4 11.9*  HCT 35.7* 40.7 38.8 36.5  PLT 196 231 251 252    COAGS: Recent Labs    06/18/21 0950 07/20/21 0642 07/20/21 1234  INR 1.1 0.9 1.1  APTT  --   --  152*    BMP: Recent Labs    06/05/21 0720 06/08/21 0829 06/18/21 0950 07/07/21 1346 07/20/21 0642  NA 139 137 139 140 139  K 3.8 3.8 3.9 4.0 4.1  CL 105 105 106 106 103  CO2 '26 25 27 25 25  ' GLUCOSE 144* 160* 136* 211* 210*  BUN '17 19 14 22 20  ' CALCIUM 9.7 9.3 9.6 9.5 9.7   CREATININE 0.82 0.82 0.88 0.83 1.00  GFRNONAA >60 >60 >60  --  >60    LIVER FUNCTION TESTS: Recent Labs    09/05/20 1522 06/08/21 0829 07/07/21 1346  BILITOT 0.4 0.4 0.4  AST '21 19 19  ' ALT '20 19 23  ' ALKPHOS 80 49 39  PROT 7.6 7.6 7.6  ALBUMIN 4.0 3.9 4.0    Assessment and Plan:  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 Right hand swelling is also improving. MRI this morning demonstrates scatted, mostly punctuate acute infarcts in the bilateral hemispheres left >right.   Patient has been evaluated by inpatient rehab and is a potential candidate. Rehab consult pending.   Anticoagulation on hold secondary to TNK administration. Aspirin and Brilinta will be restarted when patient is 24 hours out from TNK.   NIR will continue to follow.   Electronically Signed: Soyla Dryer, AGACNP-BC [phone] 07/21/2021, 9:46 AM   I spent a total of 15 Minutes at the the patient's bedside AND on the patient's hospital floor or unit, greater than 50% of which was counseling/coordinating care for left ICA aneurysm s/p embolization.

## 2021-07-21 NOTE — Progress Notes (Signed)
? ?  Inpatient Rehab Admissions Coordinator : ? ?Per therapy recommendations, patient was screened for CIR candidacy by Tessah Patchen RN MSN.  At this time patient appears to be a potential candidate for CIR. I will place a rehab consult per protocol for full assessment. Please call me with any questions. ? ?Khadir Roam RN MSN ?Admissions Coordinator ?[phone] ?  ?

## 2021-07-21 NOTE — Evaluation (Signed)
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 Post procedure, pt wit left side decreased sensation. 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 PMH: DM2, HTN, HLD, obesity   Clinical Impression   PTA, pt was living with her husband and was independent; working full time and driving. Pt currently requiring Min A for UB ADLs, Min-Mod A +2 for LB ADLs, and Min-Mod A +2 for functional mobility with RW. Pt presenting with decreased coordination at Terminous, balance, strength at R hand due to edema, and proprioception. Pt highly motivated and has good family support. Pt will require further acute OT to facilitate safe dc and increase occupational performance. Due to pt's age, motivation, and PLOF, recommend dc to AIR for intensive to increase safety and independence as well as decrease caregiver burden.      Recommendations for follow up therapy are one component of a multi-disciplinary discharge planning process, led by the attending physician.  Recommendations may be updated based on patient status, additional functional criteria and insurance authorization.   Follow Up Recommendations  Acute inpatient rehab (3hours/day)    Assistance Recommended at Discharge Frequent or constant Supervision/Assistance  Patient can return home with the following A lot of help with walking and/or transfers;A little help with bathing/dressing/bathroom;Direct supervision/assist for medications management;Direct supervision/assist for financial management    Functional Status Assessment  Patient has had a recent decline in their functional status and demonstrates the ability to make significant improvements in function in a reasonable and predictable amount of time.   Equipment Recommendations  BSC/3in1    Recommendations for Other Services Rehab consult;PT consult;Speech consult     Precautions / Restrictions Precautions Precautions: Fall Restrictions Weight Bearing Restrictions: No      Mobility Bed Mobility Overal bed mobility: Needs Assistance Bed Mobility: Supine to Sit     Supine to sit: Min assist, HOB elevated     General bed mobility comments: pt able to come to EOB from elevated HOB with increased time and min A    Transfers Overall transfer level: Needs assistance Equipment used: Rolling walker (2 wheels), 2 person hand held assist Transfers: Sit to/from Stand, Bed to chair/wheelchair/BSC Sit to Stand: Min assist, +2 safety/equipment Stand pivot transfers: Min assist, +2 safety/equipment         General transfer comment: pt able to pivot to [facility] with B HHA to the R. When going R pt did not fully appreciate LLE weakness, more notable when she stood to RW and had difficulty fully WB'ing on L side without knee buckling      Balance Overall balance assessment: Needs assistance Sitting-balance support: No upper extremity supported, Feet supported Sitting balance-Leahy Scale: Good     Standing balance support: Bilateral upper extremity supported, During functional activity Standing balance-Leahy Scale: Poor Standing balance comment: needed UE and external support                           ADL either performed or assessed with clinical judgement   ADL Overall ADL's : Needs assistance/impaired Eating/Feeding: Set up;Supervision/ safety;Sitting   Grooming: Wash/dry face;Set up;Supervision/safety;Sitting   Upper Body Bathing: Minimal assistance;Sitting   Lower Body Bathing: Minimal assistance;+2 for physical assistance;+2 for safety/equipment;Sit to/from stand   Upper Body Dressing : Minimal assistance;Sitting  Lower Body Dressing: +2 for physical assistance;+2 for safety/equipment;Moderate assistance    Toilet Transfer: Minimal assistance;+2 for physical assistance;+2 for safety/equipment;Ambulation;BSC/3in1   Toileting- Clothing Manipulation and Hygiene: Minimal assistance;+2 for physical assistance;+2 for safety/equipment;Sit to/from stand Toileting - Clothing Manipulation Details (indicate cue type and reason): Min A +2 for balance in standing. Pt able to perform peri care with assistance for balance     Functional mobility during ADLs: +2 for physical assistance;+2 for safety/equipment;Rolling walker (2 wheels);Moderate assistance General ADL Comments: Pt presenting with decreased balance, strength, and coorindation.     Vision Baseline Vision/History: 1 Wears glasses (reading glasses. And now Catarct sx that didnt go well) Patient Visual Report:  (Difficult to see with the left eye)       Perception     Praxis      Pertinent Vitals/Pain Pain Assessment Pain Assessment: Faces Faces Pain Scale: No hurt Pain Intervention(s): Limited activity within patient's tolerance, Monitored during session, Repositioned     Hand Dominance Right   Extremity/Trunk Assessment Upper Extremity Assessment Upper Extremity Assessment: RUE deficits/detail;LUE deficits/detail RUE Deficits / Details: edema R hand noted but pt able to put weight through it with use of RW. RUE Coordination: decreased fine motor LUE Deficits / Details: Decreased coorindation gross and FM. Able to perform finger opposition with increased time. Performing finger-to-nose with increased time. LUE Coordination: decreased fine motor;decreased gross motor   Lower Extremity Assessment Lower Extremity Assessment: Defer to PT evaluation LLE Deficits / Details: hip flex >3/5, knee ext 3+/5 but unable to maintain consistent contraction, experiences sudden failure of contraction on MMT and in standing, seems more apraxic that pure strength loss LLE Sensation: decreased proprioception LLE Coordination: decreased gross motor    Cervical / Trunk Assessment Cervical / Trunk Assessment: Normal   Communication Communication Communication: No difficulties   Cognition Arousal/Alertness: Awake/alert Behavior During Therapy: WFL for tasks assessed/performed Overall Cognitive Status: Within Functional Limits for tasks assessed                                 General Comments: Once up, pt verbalizing her surpirse with her functional performance. Demonstrating awareness and problem solving to adjust for deficits. Feel this will assist in recovery     General Comments  pt very aware of LLE limitations. Husband present and supportive    Exercises     Shoulder Instructions      Home Living Family/patient expects to be discharged to:: Private residence Living Arrangements: Spouse/significant other Available Help at Discharge: Family;Available 24 hours/day Type of Home: House Home Access: Ramped entrance     Home Layout: One level     Bathroom Shower/Tub: Occupational psychologist: Handicapped height     Home Equipment: Shower seat;Hand held shower head;Grab bars - tub/shower   Additional Comments: pt's husband home 24/7. He has LLE prosthesis, does not work, does not drive, but ambulates with cane and appears cognitively intact.      Prior Functioning/Environment Prior Level of Function : Independent/Modified Independent;Working/employed;Driving             Mobility Comments: independent ADLs Comments: Cook at [employer]        OT Problem List: Decreased strength;Decreased range of motion;Decreased activity tolerance;Impaired balance (sitting and/or standing);Decreased knowledge of precautions;Decreased knowledge of use of DME or AE;Impaired UE functional use      OT Treatment/Interventions: Self-care/ADL training;Therapeutic exercise;DME and/or AE instruction;Energy conservation;Therapeutic activities;Patient/family education  OT Goals(Current goals can be found  in the care plan section) Acute Rehab OT Goals Patient Stated Goal: Go home OT Goal Formulation: With patient/family Time For Goal Achievement: 08/04/21 Potential to Achieve Goals: Good  OT Frequency: Min 3X/week    Co-evaluation PT/OT/SLP Co-Evaluation/Treatment: Yes Reason for Co-Treatment: For patient/therapist safety;To address functional/ADL transfers PT goals addressed during session: Mobility/safety with mobility;Balance;Proper use of DME OT goals addressed during session: ADL's and self-care      AM-PAC OT "6 Clicks" Daily Activity     Outcome Measure Help from another person eating meals?: None Help from another person taking care of personal grooming?: A Little Help from another person toileting, which includes using toliet, bedpan, or urinal?: A Lot Help from another person bathing (including washing, rinsing, drying)?: A Lot Help from another person to put on and taking off regular upper body clothing?: A Little Help from another person to put on and taking off regular lower body clothing?: A Lot 6 Click Score: 16   End of Session Equipment Utilized During Treatment: Gait belt;Rolling walker (2 wheels) Nurse Communication: Mobility status  Activity Tolerance: Patient tolerated treatment well Patient left: in chair;with call bell/phone within reach;with chair alarm set;with family/visitor present  OT Visit Diagnosis: Unsteadiness on feet (R26.81);Other abnormalities of gait and mobility (R26.89);Muscle weakness (generalized) (M62.81);Hemiplegia and hemiparesis Hemiplegia - Right/Left: Left Hemiplegia - dominant/non-dominant: Non-Dominant Hemiplegia - caused by: Cerebral infarction                Time: 1042-1100 OT Time Calculation (min): 18 min Charges:  OT General Charges $OT Visit: 1 Visit OT Evaluation $OT Eval Moderate Complexity: [place], OTR/L Acute Rehab Pager: [phone] Office: [place] 07/21/2021, 3:27  PM

## 2021-07-21 NOTE — Progress Notes (Addendum)
STROKE TEAM PROGRESS NOTE   INTERVAL HISTORY Patient is seen in her room with no family at the bedside.  Yesterday, she underwent coiling of a left ICA aneurysm and developed new onset left sided weakness and decreased sensation after the procedure.  TNK was administered with improvement of symptoms.  MRI demonstrates scattered, mostly punctate, acute infarcts in bilateral cerebral hemispheres left greater than right.  Neurological exam is improving.  Vital signs are stable.  Blood pressure adequately controlled. She had right hand and forearm bruising and swelling at the site of the radial artery catheterization which has improved overnight after pressure for an hour and a radial band keeping arm elevated Vitals:   07/21/21 0800 07/21/21 0900 07/21/21 1156 07/21/21 1200  BP: (!) 120/54 130/67 (!) 146/77   Pulse: 78 69 76   Resp: 19 16 17    Temp: 98.2 F (36.8 C)   98.4 F (36.9 C)  TempSrc: Oral   Oral  SpO2: 96% 98% 97%   Weight:      Height:       CBC:  Recent Labs  Lab 07/20/21 0642  WBC 6.0  NEUTROABS 3.4  HGB 11.9*  HCT 36.5  MCV 90.6  PLT 097   Basic Metabolic Panel:  Recent Labs  Lab 07/20/21 0642  NA 139  K 4.1  CL 103  CO2 25  GLUCOSE 210*  BUN 20  CREATININE 1.00  CALCIUM 9.7   Lipid Panel:  Recent Labs  Lab 07/21/21 0111  CHOL 129  TRIG 53  HDL 49  CHOLHDL 2.6  VLDL 11  LDLCALC 69   HgbA1c:  Recent Labs  Lab 07/21/21 0111  HGBA1C 8.4*   Urine Drug Screen: No results for input(s): LABOPIA, COCAINSCRNUR, LABBENZ, AMPHETMU, THCU, LABBARB in the last 168 hours.  Alcohol Level No results for input(s): ETH in the last 168 hours.  IMAGING past 24 hours MR BRAIN WO CONTRAST  Result Date: 07/21/2021 CLINICAL DATA:  61 year old female status post endovascular embolization of left ICA terminus aneurysm with web device. Neurologic deficit. EXAM: MRI HEAD WITHOUT CONTRAST TECHNIQUE: Multiplanar, multiecho pulse sequences of the brain and surrounding  structures were obtained without intravenous contrast. COMPARISON:  CT head without contrast and CTA head and neck 07/20/2021. FINDINGS: Brain: There are scattered small - generally punctate - foci of diffusion restriction in both cerebral hemispheres. Most are in the left hemisphere involving the left MCA and left PCA territory. Among the most conspicuous is a 6 mm focus at the left external capsule abutting the lentiform (series 5, image 87 and series 7, image 70). Small involvement also at the right genu of the corpus callosum (right ACA territory). Occasional right MCA involvement. Subtle T2 and FLAIR hyperintensity associated with the acute findings. With moderate superimposed widely scattered background white matter T2 and FLAIR hyperintensity. And mild to moderate associated heterogeneity in the bilateral basal ganglia. But no cortical encephalomalacia identified. Trace if any chronic cerebral blood products. Mild susceptibility artifact at the left ICA terminus related to endovascular embolization. No restricted diffusion to suggest acute infarction. No midline shift, mass effect, evidence of mass lesion, ventriculomegaly, extra-axial collection or acute intracranial hemorrhage. Vascular: Major intracranial vascular flow voids are preserved. Skull and upper cervical spine: Negative visible cervical spine. Visualized bone marrow signal is within normal limits. Sinuses/Orbits: Postoperative changes to the left globe. Otherwise negative orbits. Paranasal Visualized paranasal sinuses and mastoids are stable and well aerated. Other: Mastoids are well aerated.  Negative visible scalp and face.  IMPRESSION: 1. 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 Among the most conspicuous is a 6 mm lacune in the left external capsule abutting the lentiform. No associated hemorrhage or mass effect. 2. Underlying moderate for age signal changes in the brain compatible with chronic  small vessel disease. Electronically Signed   By: Genevie Ann M.D.   On: 07/21/2021 05:30   ECHOCARDIOGRAM COMPLETE  Result Date: 07/20/2021    ECHOCARDIOGRAM REPORT   Patient Name:   Shannon Oliver Date of Exam: 07/20/2021 Medical Rec #:  [id number]        Height:       66.0 in Accession #:    [id number]       Weight:       231.0 lb Date of Birth:  12/08/1960        BSA:          2.126 m Patient Age:    61 years         BP:           138/80 mmHg Patient Gender: F                HR:           66 bpm. Exam Location:  Inpatient Procedure: 2D Echo Indications:    Stroke  History:        Patient has prior history of Echocardiogram examinations, most                 recent 10/30/2020. Abnormal ECG; Risk Factors:Diabetes,                 Hypertension and Dyslipidemia.  Sonographer:    Arlyss Gandy Referring Phys: [id number] [place]  1. Left ventricular ejection fraction, by estimation, is 65 to 70%. Left ventricular ejection fraction by PLAX is 66 %. The left ventricle has normal function. The left ventricle has no regional wall motion abnormalities. There is moderate asymmetric left ventricular hypertrophy of the basal-septal segment. Left ventricular diastolic parameters are consistent with Grade I diastolic dysfunction (impaired relaxation).  2. Right ventricular systolic function is normal. The right ventricular size is normal.  3. The mitral valve is grossly normal. Trivial mitral valve regurgitation.  4. The aortic valve is tricuspid. Aortic valve regurgitation is not visualized. Aortic valve sclerosis/calcification is present, without any evidence of aortic stenosis.  5. The inferior vena cava is normal in size with greater than 50% respiratory variability, suggesting right atrial pressure of 3 mmHg. Comparison(s): No prior Echocardiogram. 10/30/2020: LVEF 70-75%. FINDINGS  Left Ventricle: Left ventricular ejection fraction, by estimation, is 65 to 70%. Left ventricular ejection fraction by PLAX  is 66 %. The left ventricle has normal function. The left ventricle has no regional wall motion abnormalities. The left ventricular internal cavity size was normal in size. There is moderate asymmetric left ventricular hypertrophy of the basal-septal segment. Left ventricular diastolic parameters are consistent with Grade I diastolic dysfunction (impaired relaxation). Indeterminate filling pressures. Right Ventricle: The right ventricular size is normal. No increase in right ventricular wall thickness. Right ventricular systolic function is normal. Left Atrium: Left atrial size was normal in size. Right Atrium: Right atrial size was normal in size. Pericardium: There is no evidence of pericardial effusion. Mitral Valve: The mitral valve is grossly normal. Trivial mitral valve regurgitation. Tricuspid Valve: The tricuspid valve is grossly normal. Tricuspid valve regurgitation is trivial. Aortic Valve: The aortic valve is tricuspid. Aortic valve  regurgitation is not visualized. Aortic valve sclerosis/calcification is present, without any evidence of aortic stenosis. Aortic valve mean gradient measures 7.0 mmHg. Aortic valve peak gradient measures 9.7 mmHg. Aortic valve area, by VTI measures 2.70 cm. Pulmonic Valve: The pulmonic valve was grossly normal. Pulmonic valve regurgitation is trivial. Aorta: The aortic root and ascending aorta are structurally normal, with no evidence of dilitation. Venous: The inferior vena cava is normal in size with greater than 50% respiratory variability, suggesting right atrial pressure of 3 mmHg. IAS/Shunts: No atrial level shunt detected by color flow Doppler.  LEFT VENTRICLE PLAX 2D LV EF:         Left            Diastology                ventricular     LV e' medial:    5.87 cm/s                ejection        LV E/e' medial:  12.0                fraction by     LV e' lateral:   8.70 cm/s                PLAX is 66      LV E/e' lateral: 8.1                %. LVIDd:         3.87 cm  LVIDs:         2.49 cm LV PW:         1.22 cm LV IVS:        1.55 cm LVOT diam:     2.00 cm LV SV:         108 LV SV Index:   51 LVOT Area:     3.14 cm  RIGHT VENTRICLE RV S prime:     16.80 cm/s TAPSE (M-mode): 2.3 cm LEFT ATRIUM             Index LA diam:        2.70 cm 1.27 cm/m LA Vol (A2C):   55.4 ml 26.06 ml/m LA Vol (A4C):   34.1 ml 16.04 ml/m LA Biplane Vol: 44.3 ml 20.84 ml/m  AORTIC VALVE AV Area (Vmax):    2.94 cm AV Area (Vmean):   2.56 cm AV Area (VTI):     2.70 cm AV Vmax:           156.00 cm/s AV Vmean:          125.000 cm/s AV VTI:            0.399 m AV Peak Grad:      9.7 mmHg AV Mean Grad:      7.0 mmHg LVOT Vmax:         146.00 cm/s LVOT Vmean:        102.000 cm/s LVOT VTI:          0.343 m LVOT/AV VTI ratio: 0.86  AORTA Ao Root diam: 3.20 cm Ao Asc diam:  3.30 cm MITRAL VALVE MV Area (PHT): 2.12 cm     SHUNTS MV Decel Time: 357 msec     Systemic VTI:  0.34 m MV E velocity: 70.30 cm/s   Systemic Diam: 2.00 cm MV A velocity: 103.00 cm/s MV E/A ratio:  0.68 Lyman Bishop MD Electronically signed by Lyman Bishop MD Signature Date/Time:  07/20/2021/3:41:45 PM    Final     PHYSICAL EXAM General:  Alert, well-nourished, well-developed female in no acute distress.  Swelling and bruising of right hand noted but improved from yesterday per patient   NEURO:  Mental Status: AA&Ox3  Speech/Language: speech is without dysarthria or aphasia.  Naming, fluency, and comprehension intact.  Cranial Nerves:  II: PERRL. Visual fields full.  III, IV, VI: EOMI. Eyelids elevate symmetrically.  V: Sensation is intact to light touch and symmetrical to face.  VII: Smile is symmetrical.   VIII: hearing intact to voice. IX, X:  Phonation is normal.  XII: tongue is midline without fasciculations. Motor: 5/5 strength to RUE, RLE and LUE, 4/5 to LLE, drift noted in LLE. Tone: is normal and bulk is normal Sensation- Intact to light touch bilaterally.   Coordination: FTN intact bilaterally, HKS: no  ataxia in BLE.No drift.  Gait- deferred   ASSESSMENT/PLAN [name] is a 61 y.o. 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 After the procedure, she developed new onset left sided weakness and decreased sensation after the procedure.  TNKase was administered with improvement of symptoms.  MRI demonstrates scattered, mostly punctate, acute infarcts in bilateral cerebral hemispheres.  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 ASPECTS 10.    CTA head & neck s/p coiling of left ICA aneurysm, no LVO or significant stenosis, 55mm right ICA aneurysm MRI  scattered small and punctate infarcts in bialteral cerbral hemispheres with no hemorrhage or mass effect, chronic small vessel disease 2D Echo EF 03-54%, grade 1 diastolic dysfunction, no atrial level shunt LDL 69 HgbA1c 8.4 VTE prophylaxis - SCDs    Diet   Diet Heart Room service appropriate? Yes; Fluid consistency: Thin   aspirin 81 mg daily and Brilinta (ticagrelor) 90 mg bid prior to admission, now on No antithrombotic secondary to TNK administration yesterday.  Will start ASA when patient is > 24 hours out from TNK. Therapy recommendations:  pending Disposition:  pending  S/p coiling of left ICA aneurysm Resuma antiplatelet agents when patient is 24 hours out from TNK Continue to monitor right hand for swelling and bruising (improved on exam today)  Hypertension Home meds:  amlodipine 10 mg daily, carvedilol 25 mg daily, losartan 100 mg daily, spironolactone 25 mg daily Stable Keep BP <180/105 Long-term BP goal normotensive  Hyperlipidemia Home meds:  rosuvastatin 40 mg daily, resumed in hospital LDL 69, goal < 70 Continue statin at discharge  Diabetes type II Uncontrolled Home meds:  Farxiga 5 mg daily,  insulin aspart 8 units TIDAC, metformin 1000 mg daily HgbA1c 8.4, goal < 7.0 CBGs Recent Labs    07/20/21 0731 07/20/21 1057 07/20/21 1157  GLUCAP 172* 175* 156*    SSI  Other Stroke Risk Factors Obesity, Body mass index is 37.28 kg/m., BMI >/= 30 associated with increased stroke risk, recommend weight loss, diet and exercise as appropriate  Obstructive sleep apnea, on CPAP at home   Other Active Problems none  Hospital day # Organ , MSN, AGACNP-BC [facility] See Amion for schedule and pager information 07/21/2021 12:45 PM   STROKE MD NOTE :  I have personally obtained history,examined this patient, reviewed notes, independently viewed imaging studies, participated in medical decision making and plan  of care.ROS completed by me personally and pertinent positives fully documented  I have made any additions or clarifications directly to the above note. Agree with note above.  Patient developed left hemiparesis following endovascular coiling of terminal left ICA aneurysm.  She received IV TNK and has shown significant improvement.  Continue close neurological monitoring and strict blood pressure control as per postthrombolytic protocol.  Mobilize out of bed.  Therapy consults.  Continue conservative follow-up for the right radial swelling keep arm elevated.  Repeat CT scan of the head this morning.  Discontinue Brilinta and stay on aspirin alone.  Discussed with Dr. Norma Fredrickson.This patient is critically ill and at significant risk of neurological worsening, death and care requires constant monitoring of vital signs, hemodynamics,respiratory and cardiac monitoring, extensive review of multiple databases, frequent neurological assessment, discussion with family, other specialists and medical decision making of high complexity.I have made any additions or clarifications directly to the above note.This critical care time does not reflect procedure time, or teaching  time or supervisory time of PA/NP/Med Resident etc but could involve care discussion time.  I spent 30 minutes of neurocritical care time  in the care of  this patient.      Antony Contras, MD Medical Director [facility] Pager: [phone] 07/21/2021 1:17 PM   To contact Stroke Continuity provider, please refer to [email or web address] After hours, contact General Neurology

## 2021-07-21 NOTE — Progress Notes (Signed)
Patient is off the unit for f/u heat CT. CT staff was given report on patient and notified that patient is A/O x 4 and has LLE weakness and should not get out of bed w/o assistance.

## 2021-07-21 NOTE — Progress Notes (Signed)
°  Transition of Care [facility]) Screening Note   Patient Details  Name: Shannon Oliver Date of Birth: 1961/01/24   Transition of Care [facility]) CM/SW Contact:    Geralynn Ochs, LCSW Phone Number: 07/21/2021, 10:14 AM    Transition of Care Department [facility]) has reviewed patient and no TOC needs have been identified at this time; medical workup ongoing. We will continue to monitor patient advancement through interdisciplinary progression rounds. If new patient transition needs arise, please place a TOC consult.

## 2021-07-21 NOTE — Evaluation (Addendum)
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 Post procedure, pt wit left side decreased sensation. 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 PMH: DM2, HTN, HLD, obesity  Clinical Impression  Pt admitted with above diagnosis. Pt received in bed. Pt has been moving in the bed and was very surprised by degree of LLE weakness when she got up. Pt able to transfer bed to [facility] to R with min A. However, with sit>stand to RW and ambulation, pt experienced LLE neuromuscular control and coordination issues with buckling and the need for AD and external support. PTA pt was independent from home with husband and working at [employer] as a Training and development officer. To return to this level recommend AIR level therapy.   Pt currently with functional limitations due to the deficits listed below (see PT Problem List). Pt will benefit from skilled PT to increase their independence and safety with mobility to allow discharge to the venue listed below.          Recommendations for follow up therapy are one component of a multi-disciplinary discharge planning process, led by the attending physician.  Recommendations may be updated based on patient status, additional functional criteria and insurance authorization.  Follow Up Recommendations Acute inpatient rehab (3hours/day)    Assistance Recommended at Discharge Frequent or constant Supervision/Assistance  Patient can return home with the following  Two people to help with walking and/or transfers;A little help with bathing/dressing/bathroom;Assistance with cooking/housework;Assist for transportation    Equipment Recommendations Rolling walker (2 wheels)  Recommendations for Other Services  Rehab consult    Functional Status  Assessment Patient has had a recent decline in their functional status and demonstrates the ability to make significant improvements in function in a reasonable and predictable amount of time.     Precautions / Restrictions Precautions Precautions: Fall Restrictions Weight Bearing Restrictions: No      Mobility  Bed Mobility Overal bed mobility: Needs Assistance Bed Mobility: Supine to Sit     Supine to sit: Min assist, HOB elevated     General bed mobility comments: pt able to come to EOB from elevated HOB with increased time and min A    Transfers Overall transfer level: Needs assistance Equipment used: Rolling walker (2 wheels), 2 person hand held assist Transfers: Sit to/from Stand, Bed to chair/wheelchair/BSC Sit to Stand: Min assist, +2 safety/equipment Stand pivot transfers: Min assist, +2 safety/equipment         General transfer comment: pt able to pivot to [facility] with B HHA to the R. When going R pt did not fully appreciate LLE weakness, more notable when she stood to RW and had difficulty fully WB'ing on L side without knee buckling    Ambulation/Gait Ambulation/Gait assistance: Mod assist, +2 safety/equipment Gait Distance (Feet): 10 Feet Assistive device: Rolling walker (2 wheels) Gait Pattern/deviations: Decreased weight shift to left, Decreased stance time - left, Decreased step length - left Gait velocity: decreased Gait velocity interpretation: <1.31 ft/sec, indicative of household ambulator   General Gait Details: pt unsteady with gait, quickly making compensations for LLE weakness with use of UE's but would be unable to ambulate without RW and physical assist. Fatigued quickly with gait  Stairs            Wheelchair Mobility    Modified Rankin (Stroke  Patients Only)       Balance Overall balance assessment: Needs assistance Sitting-balance support: No upper extremity supported, Feet supported Sitting balance-Leahy Scale: Good      Standing balance support: Bilateral upper extremity supported, During functional activity Standing balance-Leahy Scale: Poor Standing balance comment: needed UE and external support                             Pertinent Vitals/Pain Pain Assessment Pain Assessment: Faces Faces Pain Scale: No hurt    Home Living Family/patient expects to be discharged to:: Private residence Living Arrangements: Spouse/significant other Available Help at Discharge: Family;Available 24 hours/day Type of Home: House Home Access: Ramped entrance       Home Layout: One level Home Equipment: Shower seat;Hand held shower head;Grab bars - tub/shower Additional Comments: pt's husband home 24/7. He has LLE prosthesis, does not work, does not drive, but ambulates with cane and appears cognitively intact.    Prior Function Prior Level of Function : Independent/Modified Independent;Working/employed;Driving             Mobility Comments: independent ADLs Comments: Cook at [employer]   Dominant Hand: Right    Extremity/Trunk Assessment   Upper Extremity Assessment Upper Extremity Assessment: Defer to OT evaluation RUE Deficits / Details: edema R hand noted but pt able to put wt through it with use of RW RUE Coordination: decreased fine motor LUE Deficits / Details: Decreased coorindation gross and FM. LUE Coordination: decreased fine motor;decreased gross motor    Lower Extremity Assessment Lower Extremity Assessment: LLE deficits/detail LLE Deficits / Details: hip flex >3/5, knee ext 3+/5 but unable to maintain consistent contraction, experiences sudden failure of contraction on MMT and in standing, seems more apraxic that pure strength loss LLE Sensation: decreased proprioception LLE Coordination: decreased gross motor    Cervical / Trunk Assessment Cervical / Trunk Assessment: Normal  Communication   Communication: No difficulties  Cognition  Arousal/Alertness: Awake/alert Behavior During Therapy: WFL for tasks assessed/performed Overall Cognitive Status: Within Functional Limits for tasks assessed                                 General Comments: Once up, pt verbalizing her surpirse with her functional performance. Demonstrating awareness and problem solving to adjust for deficits. Feel this will assist in recovery        General Comments General comments (skin integrity, edema, etc.): pt very aware of LLE limitations. Husband present and supportive    Exercises     Assessment/Plan    PT Assessment Patient needs continued PT services  PT Problem List Decreased strength;Decreased activity tolerance;Decreased balance;Decreased mobility;Decreased coordination;Decreased cognition;Decreased knowledge of use of DME;Decreased safety awareness;Decreased knowledge of precautions;Impaired sensation       PT Treatment Interventions DME instruction;Gait training;Functional mobility training;Therapeutic activities;Therapeutic exercise;Balance training;Neuromuscular re-education;Cognitive remediation;Patient/family education    PT Goals (Current goals can be found in the Care Plan section)  Acute Rehab PT Goals Patient Stated Goal: return home PT Goal Formulation: With patient Time For Goal Achievement: 08/04/21 Potential to Achieve Goals: Good    Frequency Min 4X/week     Co-evaluation PT/OT/SLP Co-Evaluation/Treatment: Yes Reason for Co-Treatment: Complexity of the patient's impairments (multi-system involvement);For patient/therapist safety PT goals addressed during session: Mobility/safety with mobility;Balance;Proper use of DME         AM-PAC PT "6 Clicks" Mobility  Outcome Measure Help needed turning from your back to your side while in a flat bed without using bedrails?: None Help needed moving from lying on your back to sitting on the side of a flat bed without using bedrails?: A Little Help needed  moving to and from a bed to a chair (including a wheelchair)?: A Little Help needed standing up from a chair using your arms (e.g., wheelchair or bedside chair)?: Total Help needed to walk in hospital room?: Total Help needed climbing 3-5 steps with a railing? : Total 6 Click Score: 13    End of Session Equipment Utilized During Treatment: Gait belt Activity Tolerance: Patient tolerated treatment well Patient left: in chair;with call bell/phone within reach;with chair alarm set;with family/visitor present Nurse Communication: Mobility status PT Visit Diagnosis: Unsteadiness on feet (R26.81);Hemiplegia and hemiparesis;Difficulty in walking, not elsewhere classified (R26.2) Hemiplegia - Right/Left: Left Hemiplegia - dominant/non-dominant: Non-dominant Hemiplegia - caused by: Nontraumatic intracerebral hemorrhage    Time: 1043-1100 PT Time Calculation (min) (ACUTE ONLY): 17 min   Charges:   PT Evaluation $PT Eval Moderate Complexity: [place]  Pager [phone] Office [place] 07/21/2021, 1:31 PM

## 2021-07-22 LAB — GLUCOSE, CAPILLARY
Glucose-Capillary: 206 mg/dL — ABNORMAL HIGH (ref 70–99)
Glucose-Capillary: 297 mg/dL — ABNORMAL HIGH (ref 70–99)
Glucose-Capillary: 248 mg/dL — ABNORMAL HIGH (ref 70–99)

## 2021-07-22 MED ORDER — INSULIN ASPART 100 UNIT/ML IJ SOLN
0.0000 [IU] | Freq: Every day | INTRAMUSCULAR | Status: DC
  Administered 2021-07-22: 2 [IU] via SUBCUTANEOUS

## 2021-07-22 MED ORDER — CARVEDILOL 12.5 MG PO TABS
37.5000 mg | ORAL_TABLET | Freq: Two times a day (BID) | ORAL | Status: DC
  Administered 2021-07-22 – 2021-07-23 (×3): 37.5 mg via ORAL
  Filled 2021-07-22 (×3): qty 3

## 2021-07-22 MED ORDER — LOSARTAN POTASSIUM 50 MG PO TABS
100.0000 mg | ORAL_TABLET | Freq: Every day | ORAL | Status: DC
Start: 2021-07-22 — End: 2021-07-23
  Administered 2021-07-22 – 2021-07-23 (×2): 100 mg via ORAL
  Filled 2021-07-22 (×2): qty 2

## 2021-07-22 MED ORDER — INSULIN ASPART 100 UNIT/ML IJ SOLN
0.0000 [IU] | Freq: Three times a day (TID) | INTRAMUSCULAR | Status: DC
  Administered 2021-07-22: 3 [IU] via SUBCUTANEOUS
  Administered 2021-07-22: 5 [IU] via SUBCUTANEOUS
  Administered 2021-07-23: 2 [IU] via SUBCUTANEOUS
  Administered 2021-07-23: 3 [IU] via SUBCUTANEOUS

## 2021-07-22 NOTE — Progress Notes (Addendum)
STROKE TEAM PROGRESS NOTE   INTERVAL HISTORY Patient is seen in her room with no family at the bedside.   No changes overnight neurological exam is improving.  Vital signs are stable.  Blood pressure adequately controlled.  right hand and forearm bruising and swelling at the site of the radial artery catheterization is improving v Vitals:   07/22/21 0042 07/22/21 0310 07/22/21 0901 07/22/21 1142  BP: (!) 142/76 131/67 (!) 149/80 (!) 149/78  Pulse: 63 64 66 72  Resp: 18 19 14 14   Temp: 98.4 F (36.9 C) 97.9 F (36.6 C) 98.1 F (36.7 C) 98.2 F (36.8 C)  TempSrc:  Oral Oral Oral  SpO2: 99% 98% 99% 100%  Weight:      Height:       CBC:  Recent Labs  Lab 07/20/21 0642  WBC 6.0  NEUTROABS 3.4  HGB 11.9*  HCT 36.5  MCV 90.6  PLT 660   Basic Metabolic Panel:  Recent Labs  Lab 07/20/21 0642  NA 139  K 4.1  CL 103  CO2 25  GLUCOSE 210*  BUN 20  CREATININE 1.00  CALCIUM 9.7   Lipid Panel:  Recent Labs  Lab 07/21/21 0111  CHOL 129  TRIG 53  HDL 49  CHOLHDL 2.6  VLDL 11  LDLCALC 69   HgbA1c:  Recent Labs  Lab 07/21/21 0111  HGBA1C 8.4*   Urine Drug Screen: No results for input(s): LABOPIA, COCAINSCRNUR, LABBENZ, AMPHETMU, THCU, LABBARB in the last 168 hours.  Alcohol Level No results for input(s): ETH in the last 168 hours.  IMAGING past 24 hours No results found.  PHYSICAL EXAM General:  Alert, well-nourished, well-developed female in no acute distress.  Swelling and bruising of right hand noted but improved from yesterday per patient   NEURO:  Mental Status: AA&Ox3  Speech/Language: speech is without dysarthria or aphasia.  Naming, fluency, and comprehension intact.  Cranial Nerves:  II: PERRL. Visual fields full.  III, IV, VI: EOMI. Eyelids elevate symmetrically.  V: Sensation is intact to light touch and symmetrical to face.  VII: Smile is symmetrical.   VIII: hearing intact to voice. IX, X:  Phonation is normal.  XII: tongue is midline  without fasciculations. Motor: 5/5 strength to RUE, RLE and LUE, 4/5 to LLE, drift noted in LLE. Tone: is normal and bulk is normal Sensation- Intact to light touch bilaterally.   Coordination: FTN intact bilaterally, HKS: no ataxia in BLE.No drift.  Gait- deferred   ASSESSMENT/PLAN Ms. JAIDEE STIPE is a 61 y.o. 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 After the procedure, she developed new onset left sided weakness and decreased sensation after the procedure.  TNKase was administered with improvement of symptoms.  MRI demonstrates scattered, mostly punctate, acute infarcts in bilateral cerebral hemispheres.  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 ASPECTS 10.    CTA head & neck s/p coiling of left ICA aneurysm, no LVO or significant stenosis, 51mm right ICA aneurysm MRI  scattered small and punctate infarcts in bialteral cerbral hemispheres with no hemorrhage or mass effect, chronic small vessel disease 2D Echo EF 63-01%, grade 1 diastolic dysfunction, no atrial level shunt LDL 69 HgbA1c 8.4 VTE prophylaxis - SCDs    Diet   Diet Heart Room service appropriate? Yes; Fluid  consistency: Thin   aspirin 81 mg daily and Brilinta (ticagrelor) 90 mg bid prior to admission, now on aspirin Therapy recommendations: CLR disposition:  pending  S/p coiling of left ICA aneurysm Resuma antiplatelet agents when patient is 24 hours out from TNK Continue to monitor right hand for swelling and bruising (improved on exam today)  Hypertension Home meds:  amlodipine 10 mg daily, carvedilol 25 mg daily, losartan 100 mg daily, spironolactone 25 mg daily Stable Keep BP <180/105 Long-term BP goal normotensive  Hyperlipidemia Home meds:  rosuvastatin 40 mg daily, resumed in  hospital LDL 69, goal < 70 Continue statin at discharge  Diabetes type II Uncontrolled Home meds:  Farxiga 5 mg daily, insulin aspart 8 units TIDAC, metformin 1000 mg daily HgbA1c 8.4, goal < 7.0 CBGs Recent Labs    07/20/21 1057 07/20/21 1157 07/22/21 1144  GLUCAP 175* 156* 206*    SSI  Other Stroke Risk Factors Obesity, Body mass index is 37.28 kg/m., BMI >/= 30 associated with increased stroke risk, recommend weight loss, diet and exercise as appropriate  Obstructive sleep apnea, on CPAP at home   Other Active Problems none  Hospital day # 2   Patient developed left hemiparesis following endovascular coiling of terminal left ICA aneurysm.  She received IV TNK and has shown significant improvement.  Mobilize out of bed.  Therapy consults.  Continue conservative follow-up for the right radial swelling keep arm elevated.  Continue aspirin alone.  Discussed with Dr. Norma Fredrickson.transfer to inpatient rehab when bed available.  Greater than 50% time during this 35-minute visit was spent on counseling and coordination of care and discussion with patient and care team and answering questions.      Antony Contras, MD Medical Director [facility] Stroke [facility] Pager: [phone] 07/22/2021 3:15 PM   To contact Stroke Continuity provider, please refer to [email or web address] After hours, contact General Neurology

## 2021-07-22 NOTE — Progress Notes (Addendum)
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 Post procedure, pt wit left side decreased sensation. 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 PMH: DM2, HTN, HLD, obesity    PT Comments    Pt continues to make steady progress with mobility, ambulating short hallway distance x2 with use of RW and close guard-min assist. Pt with min LLE buckling even with RW use, cues for reliance on Ues when stepping through with LLE during gait. Pt tolerated repeated sit<>stands and step ups bilat, again heavy reliance on Ues needed to support LLE. AIR remains appropriate for maximizing functional independence and mobility, pt eager to return to work.     Recommendations for follow up therapy are one component of a multi-disciplinary discharge planning process, led by the attending physician.  Recommendations may be updated based on patient status, additional functional criteria and insurance authorization.  Follow Up Recommendations  Acute inpatient rehab (3hours/day)     Assistance Recommended at Discharge Frequent or constant Supervision/Assistance  Patient can return home with the following A little help with bathing/dressing/bathroom;Assistance with cooking/housework;Assist for transportation;A little help with walking and/or transfers   Equipment Recommendations  Rolling walker (2 wheels)    Recommendations for Other Services       Precautions / Restrictions Precautions Precautions: Fall Restrictions Weight Bearing Restrictions: No     Mobility  Bed Mobility Overal bed mobility: Needs Assistance             General bed mobility comments: sitting EOB upon PT arrival    Transfers Overall transfer level: Needs assistance Equipment used:  Rolling walker (2 wheels) Transfers: Sit to/from Stand Sit to Stand: Min assist           General transfer comment: light rise and steady assist, STS x4 during session x1 from EOB x3 from chair in rehab gym    Ambulation/Gait Ambulation/Gait assistance: Min guard Gait Distance (Feet): 100 Feet (x2 - to and from rehab gym with rest breaks) Assistive device: Rolling walker (2 wheels) Gait Pattern/deviations: Decreased weight shift to left, Decreased stance time - left, Decreased step length - left, Knees buckling, Decreased dorsiflexion - left Gait velocity: decr     General Gait Details: close guard for safety, pt with x2 observed LLE buckles which happen when pt lessens UE support on RW (pt corrected). Verbal cuing for upright posture, placement in RW, increasing L foot clearance via DF   Stairs             Wheelchair Mobility    Modified Rankin (Stroke Patients Only)       Balance Overall balance assessment: Needs assistance Sitting-balance support: No upper extremity supported, Feet supported Sitting balance-Leahy Scale: Good     Standing balance support: Bilateral upper extremity supported, During functional activity Standing balance-Leahy Scale: Poor Standing balance comment: needed UE and external support                            Cognition Arousal/Alertness: Awake/alert Behavior During Therapy: WFL for tasks assessed/performed Overall Cognitive Status: Within Functional Limits for tasks assessed                                 General  Comments: aware of deficits, difficulty understanding how her procedure led to current deficits, asks for clarification multiple times        Exercises Other Exercises Other Exercises: step up 2x10 bilat, PT guarding LLE and encouraging upright posture especially during LLE stance phase for core and hip and knee extensor activation    General Comments        Pertinent Vitals/Pain Pain  Assessment Pain Assessment: Faces Faces Pain Scale: Hurts little more Pain Location: R hand (+ swelling and rubor post-procedure) Pain Descriptors / Indicators: Sore, Discomfort Pain Intervention(s): Limited activity within patient's tolerance, Monitored during session, Repositioned    Home Living                          Prior Function            PT Goals (current goals can now be found in the care plan section) Acute Rehab PT Goals Patient Stated Goal: return home PT Goal Formulation: With patient Time For Goal Achievement: 08/04/21 Potential to Achieve Goals: Good Progress towards PT goals: Progressing toward goals    Frequency    Min 4X/week      PT Plan Current plan remains appropriate    Co-evaluation              AM-PAC PT "6 Clicks" Mobility   Outcome Measure  Help needed turning from your back to your side while in a flat bed without using bedrails?: A Little Help needed moving from lying on your back to sitting on the side of a flat bed without using bedrails?: A Little Help needed moving to and from a bed to a chair (including a wheelchair)?: A Little Help needed standing up from a chair using your arms (e.g., wheelchair or bedside chair)?: A Little Help needed to walk in hospital room?: A Little Help needed climbing 3-5 steps with a railing? : A Lot 6 Click Score: 17    End of Session   Activity Tolerance: Patient tolerated treatment well Patient left: in bed;with call bell/phone within reach;with family/visitor present; pt encouraged to press call button and wait for assist prior to mobilizing OOB, states RN staff are letting her mobilize to bathroom with RW without assist Nurse Communication: Mobility status PT Visit Diagnosis: Unsteadiness on feet (R26.81);Hemiplegia and hemiparesis;Difficulty in walking, not elsewhere classified (R26.2) Hemiplegia - dominant/non-dominant: Non-dominant Hemiplegia - caused by: Nontraumatic  intracerebral hemorrhage     Time: 0924-0948 PT Time Calculation (min) (ACUTE ONLY): 24 min  Charges:  $Gait Training: 8-22 mins $Therapeutic Activity: 8-22 mins          Stacie Glaze, PT DPT Acute Rehabilitation Services Pager [phone]  Office [phone]    [place] 07/22/2021, 10:34 AM

## 2021-07-22 NOTE — Evaluation (Signed)
Speech Language Pathology Evaluation Patient Details Name: Shannon Oliver MRN: [id number] DOB: [birth year] Today's Date: 07/22/2021 Time: 8563-1497 SLP Time Calculation (min) (ACUTE ONLY): 19 min  Problem List:  Patient Active Problem List   Diagnosis Date Noted   Brain aneurysm 07/20/2021   Stroke determined by clinical assessment ([place]) 07/20/2021   Hyperlipidemia 04/30/2021   BMI 39.0-39.9,adult 04/17/2021   Elevated troponin 10/30/2020   AKI (acute kidney injury) ([place]) 10/30/2020   Palpitations    Chest pain 10/29/2020   Encounter for orthopedic follow-up care 08/19/2020   Pain in joint of left shoulder 05/14/2020   Hypertensive left ventricular hypertrophy, without heart failure 12/18/2018   Obesity due to excess calories without serious comorbidity 12/18/2018   Type 2 diabetes mellitus without complication, without long-term current use of insulin ([place]) 12/15/2018   Postmenopausal bleeding 06/05/2017   Neck pain 03/01/2014   Edema 01/25/2013   DM (diabetes mellitus) ([place]) 01/25/2013   Dyspnea 01/25/2013   Abnormal ECG 01/25/2013   Hypertension    Acid reflux    Past Medical History:  Past Medical History:  Diagnosis Date   Abnormal EKG    LVH with strain   Acid reflux    Asthma    Depression    Diabetes mellitus    A1C over 9   HLD (hyperlipidemia)    Hypertension    Noncompliance    Obesity    Pneumonia    Sleep apnea    Past Surgical History:  Past Surgical History:  Procedure Laterality Date   CARDIAC CATHETERIZATION     CATARACT EXTRACTION Left 06/04/2021   COLONOSCOPY WITH PROPOFOL N/A 04/29/2015   Procedure: COLONOSCOPY WITH PROPOFOL;  Surgeon: Garlan Fair, MD;  Location: [facility];  Service: Endoscopy;  Laterality: N/A;   EYE SURGERY     HYSTEROSCOPY WITH D & C N/A 09/07/2017   Procedure: DILATATION AND CURETTAGE /HYSTEROSCOPY;  Surgeon: Sloan Leiter, MD;  Location: [facility];  Service: Gynecology;  Laterality: N/A;   IR  ANGIO INTRA EXTRACRAN SEL COM CAROTID INNOMINATE UNI R MOD SED  06/18/2021   IR ANGIO INTRA EXTRACRAN SEL INTERNAL CAROTID UNI L MOD SED  06/18/2021   IR ANGIO INTRA EXTRACRAN SEL INTERNAL CAROTID UNI R MOD SED  07/20/2021   IR ANGIO VERTEBRAL SEL VERTEBRAL UNI R MOD SED  06/18/2021   IR ANGIOGRAM FOLLOW UP STUDY  07/20/2021   IR CT HEAD LTD  07/20/2021   IR RADIOLOGIST EVAL & MGMT  06/19/2021   IR TRANSCATH/EMBOLIZ  07/20/2021   IR US GUIDE VASC ACCESS RIGHT  06/18/2021   KNEE ARTHROSCOPY W/ MENISCAL REPAIR Right    RADIOLOGY WITH ANESTHESIA N/A 07/20/2021   Procedure: IR WITH ANESTHESIA;  Surgeon: Pedro Earls, MD;  Location: [facility];  Service: Radiology;  Laterality: N/A;   HPI:  Pt is a 61 y.o. female who underwent R radial approach elective cerebral angio for left ICA aneurysm embolization 1/16. Decreased sensation and LUE weakness noted post procedure and code stroke was called. MRI showed scattered small and punctate infarcts in bilateral cerebral hemispheres. PMH: DM2, HTN, HLD, obesity, OSA.   Assessment / Plan / Recommendation Clinical Impression  Pt participated in speech/language/cognition evaluation with her husband present. Pt reported that she  completed high school and has some college-level education. 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 She denied any baseline deficits in speech, language, or  cognition. Pt initially denied any acute changes in cognition but, at the end of the evaluation, she reported some possible changes in memory. Pt's speech and language skills were WNL. [facility] Mental Status Examination was completed to evaluate the pt's cognitive-linguistic skills. She achieved a score of 24/30 which is below the normal limits of 27 or more out of 30 and is suggestive of a mild impairment. She exhibited difficulty in the areas of attention, memory, and executive function.  Skilled SLP services are clinically indicated at this time to improve cognitive-linguistic function.    SLP Assessment  SLP Recommendation/Assessment: Patient needs continued Speech [place] [facility] SLP Visit Diagnosis: Cognitive communication deficit (R41.841)    Recommendations for follow up therapy are one component of a multi-disciplinary discharge planning process, led by the attending physician.  Recommendations may be updated based on patient status, additional functional criteria and insurance authorization.    Follow Up Recommendations  Acute inpatient rehab (3hours/day)    Assistance Recommended at Discharge  None  Functional Status Assessment Patient has had a recent decline in their functional status and demonstrates the ability to make significant improvements in function in a reasonable and predictable amount of time.  Frequency and Duration min 2x/week  2 weeks      SLP Evaluation Cognition  Overall Cognitive Status: Impaired/Different from baseline Arousal/Alertness: Awake/alert Orientation Level: Oriented X4 Year: 2023 Month: January Day of Week: Correct Attention: Focused;Sustained;Selective Focused Attention: Appears intact Sustained Attention: Appears intact Selective Attention: Impaired Selective Attention Impairment: Verbal complex Memory: Impaired Memory Impairment: Retrieval deficit;Decreased short term memory (Immediate: 5/5; delayed: 2/5; with cues: 3/3) Awareness: Appears intact Problem Solving: Appears intact Executive Function: Writer: Impaired Organizing Impairment: Verbal complex (backward digit span: 1/2)       Comprehension  Auditory Comprehension Overall Auditory Comprehension: Appears within functional limits for tasks assessed Yes/No Questions: Within Functional Limits Commands: Within Functional Limits Conversation: Complex Visual Recognition/Discrimination Discrimination: Within Function Limits Reading  Comprehension Reading Status: Within funtional limits    Expression Expression Primary Mode of Expression: Verbal Verbal Expression Overall Verbal Expression: Appears within functional limits for tasks assessed Initiation: No impairment Level of Generative/Spontaneous Verbalization: Conversation Repetition: No impairment Naming: No impairment Pragmatics: No impairment Interfering Components: Attention Written Expression Dominant Hand: Right   Oral / Motor  Motor Speech Overall Motor Speech: Appears within functional limits for tasks assessed Respiration: Within functional limits Phonation: Normal Resonance: Within functional limits Articulation: Within functional limitis Intelligibility: Intelligible Motor Planning: Witnin functional limits           Savien Mamula I. Hardin Negus, [place], [place] Office number [phone] Pager [place] 07/22/2021, 1:50 PM

## 2021-07-22 NOTE — Progress Notes (Signed)
Inpatient Rehabilitation Admissions Coordinator  ° °I met at bedside with patient and her spouse. We discussed goals and expectations of a possible [facility] admit. They would like to pursue admit. I will begin insurance Auth with [employer] for possible admit. I will follow up tomorrow. ° °Barbara Boyette, RN, MSN °Rehab Admissions Coordinator °[phone] °07/22/2021 11:12 AM ° °

## 2021-07-22 NOTE — Plan of Care (Signed)
Pt is alert oriented x 4, ambulatory with walker. Pt has bruising to bilateral hands and arms. Swelling to right arm/ hand. Pt c/o pain to right hand, prn tylenol given with effective results.   Problem: Education: Goal: Knowledge of General Education information will improve Description: Including pain rating scale, medication(s)/side effects and non-pharmacologic comfort measures Outcome: Progressing   Problem: Health Behavior/Discharge Planning: Goal: Ability to manage health-related needs will improve Outcome: Progressing   Problem: Clinical Measurements: Goal: Ability to maintain clinical measurements within normal limits will improve Outcome: Progressing Goal: Will remain free from infection Outcome: Progressing Goal: Diagnostic test results will improve Outcome: Progressing Goal: Respiratory complications will improve Outcome: Progressing Goal: Cardiovascular complication will be avoided Outcome: Progressing   Problem: Activity: Goal: Risk for activity intolerance will decrease Outcome: Progressing   Problem: Nutrition: Goal: Adequate nutrition will be maintained Outcome: Progressing   Problem: Coping: Goal: Level of anxiety will decrease Outcome: Progressing   Problem: Elimination: Goal: Will not experience complications related to bowel motility Outcome: Progressing Goal: Will not experience complications related to urinary retention Outcome: Progressing   Problem: Pain Managment: Goal: General experience of comfort will improve Outcome: Progressing   Problem: Safety: Goal: Ability to remain free from injury will improve Outcome: Progressing   Problem: Skin Integrity: Goal: Risk for impaired skin integrity will decrease Outcome: Progressing   Problem: Coping: Goal: Will verbalize positive feelings about self Outcome: Progressing Goal: Will identify appropriate support needs Outcome: Progressing   Problem: Health Behavior/Discharge Planning: Goal:  Ability to manage health-related needs will improve Outcome: Progressing   Problem: Self-Care: Goal: Ability to participate in self-care as condition permits will improve Outcome: Progressing Goal: Verbalization of feelings and concerns over difficulty with self-care will improve Outcome: Progressing Goal: Ability to communicate needs accurately will improve Outcome: Progressing   Problem: Ischemic Stroke/TIA Tissue Perfusion: Goal: Complications of ischemic stroke/TIA will be minimized Outcome: Progressing

## 2021-07-22 NOTE — Progress Notes (Signed)
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 She endorses continued improvement with strength/mobility/range of motion in her right hand and left-side of body.   Allergies: Canagliflozin and Hydrocodone  Medications: Prior to Admission medications   Medication Sig Start Date End Date Taking? Authorizing Provider  acetaminophen (TYLENOL) 500 MG tablet Take 1,000 mg by mouth every 4 (four) hours as needed for moderate pain.   Yes [provider]  amLODipine (NORVASC) 10 MG tablet TAKE 1 TABLET EVERY DAY ONCE A DAY ORALLY 90 03/13/18  Yes Elayne Snare, MD  aspirin EC 81 MG tablet Take 1 tablet (81 mg total) by mouth daily. Swallow whole. Start on 07/13/2020. 07/08/21  Yes de Sindy Messing, Erven Colla, MD  carvedilol (COREG) 25 MG tablet Take 37.5 mg by mouth 2 (two) times daily with a meal. 04/17/21 07/20/21 Yes [provider]  FARXIGA 5 MG TABS tablet TAKE 1 TABLET (5 MG TOTAL) BY MOUTH DAILY. 06/15/21  Yes Elayne Snare, MD  ibuprofen (ADVIL) 200 MG tablet Take 400 mg by mouth every 4 (four) hours as needed for moderate pain.   Yes [provider]  insulin aspart (FIASP) 100 UNIT/ML FlexTouch Pen Inject 8 Units into the skin 3 (three) times daily before meals. 07/09/21  Yes Elayne Snare, MD  losartan (COZAAR) 100 MG tablet Take 100 mg by mouth daily. 04/17/21  Yes [provider]  MELATONIN PO Take 1 capsule by mouth at bedtime as needed (sleep).   Yes [provider]  metFORMIN (GLUCOPHAGE) 1000 MG tablet Take 1,000 mg by mouth daily. 03/18/20  Yes [provider]  Multiple Vitamins-Calcium (ONE-A-DAY WOMENS PO) Take 1 tablet by mouth daily.   Yes [provider]  prednisoLONE acetate (PRED FORTE) 1 % ophthalmic suspension Place 1 drop  into the left eye daily. 06/13/21  Yes [provider]  promethazine (PHENERGAN) 12.5 MG tablet Take 12.5 mg by mouth every 6 (six) hours as needed for nausea or vomiting.   Yes [provider]  rosuvastatin (CRESTOR) 40 MG tablet Take 40 mg by mouth daily. 04/27/21  Yes [provider]  Sodium Chloride, Hypertonic, (MURO 801 OP) Place 1 application into the left eye at bedtime.   Yes [provider]  SODIUM CHLORIDE, HYPERTONIC, OP Place 1 drop into the left eye daily as needed (dry eyes/irritation).   Yes [provider]  spironolactone (ALDACTONE) 25 MG tablet Take 1 tablet by mouth daily. 04/17/21 07/20/21 Yes [provider]  ticagrelor (BRILINTA) 90 MG TABS tablet Take 1 tablet (90 mg total) by mouth 2 (two) times daily. Start on 07/13/2021 07/08/21  Yes de Sindy Messing, Erven Colla, MD  Accu-Chek Softclix Lancets lancets Use to check blood sugar twice daily. 04/27/21   Elayne Snare, MD  benzonatate (TESSALON) 100 MG capsule Take 1 capsule (100 mg total) by mouth every 8 (eight) hours. Patient not taking: Reported on 07/16/2021 07/02/21   Dorie Rank, MD  Continuous Blood Gluc Sensor (DEXCOM G6 SENSOR) MISC Use to monitor blood sugar, change after 10 days 03/30/21   Elayne Snare, MD  glucose blood (ACCU-CHEK GUIDE) test strip Use as instructed to check blood sugar twice daily. 04/27/21   Elayne Snare, MD  insulin glargine, 1 Unit Dial, (TOUJEO SOLOSTAR) 300 UNIT/ML Solostar Pen Adjust as directed 07/09/21  Elayne Snare, MD  Insulin Syringe-Needle U-100 30G X 1/2" 0.3 ML MISC Use twice a day with insulin 10/17/17   Elayne Snare, MD  oxyCODONE-acetaminophen (PERCOCET/ROXICET) 5-325 MG tablet Take 1 tablet by mouth every 6 (six) hours as needed for up to 5 doses for severe pain. Patient not taking: Reported on 07/09/2021 04/25/21   Luna Fuse, MD  potassium chloride SA (KLOR-CON M20) 20 MEQ tablet Take 1 tablet (20 mEq total) by mouth daily. Patient not  taking: Reported on 07/09/2021 03/30/21   Elayne Snare, MD     Vital Signs: BP (!) 149/78 (BP Location: Left Arm)    Pulse 72    Temp 98.2 F (36.8 C) (Oral)    Resp 14    Ht 5' 6" (1.676 m)    Wt 231 lb (104.8 kg)    LMP 08/02/2013 Comment: spotting   SpO2 100%    BMI 37.28 kg/m   Physical Exam Constitutional:      General: She is not in acute distress.    Appearance: She is not ill-appearing.  Cardiovascular:     Comments: Decreased swelling to right hand. Patient states there is some mild tingling sensations at the tip. She is unable to close her hand into a fist but she states it's getting better.  Pulmonary:     Effort: Pulmonary effort is normal.  Neurological:     Mental Status: She is alert and oriented to person, place, and time.    Imaging: CT HEAD WO CONTRAST (5MM)  Result Date: 07/21/2021 CLINICAL DATA:  Stroke.  TNK follow-up EXAM: CT HEAD WITHOUT CONTRAST TECHNIQUE: Contiguous axial images were obtained from the base of the skull through the vertex without intravenous contrast. RADIATION DOSE REDUCTION: This exam was performed according to the departmental dose-optimization program which includes automated exposure control, adjustment of the mA and/or kV according to patient size and/or use of iterative reconstruction technique. COMPARISON:  MRI head 07/21/2021.  CT head 07/20/2021 FINDINGS: Brain: Negative for acute hemorrhage. Ventricle size normal. Patchy white matter hypodensity bilaterally Small areas of acute infarct identified on diffusion-weighted imaging are not well delineated on CT. Vascular: Negative for hyperdense vessel. Prior coiling of the terminal left ICA aneurysm. Skull: Negative Sinuses/Orbits: Mild mucosal edema paranasal sinuses. No orbital lesion. Left cataract extraction. Other: None IMPRESSION: 1. No acute intracranial hemorrhage 2. Diffuse white matter hypodensity compatible with ischemia. Small areas of restricted diffusion best seen on MRI today.  Electronically Signed   By: Franchot Gallo M.D.   On: 07/21/2021 13:40   MR BRAIN WO CONTRAST  Result Date: 07/21/2021 CLINICAL DATA:  61 year old female status post endovascular embolization of left ICA terminus aneurysm with web device. Neurologic deficit. EXAM: MRI HEAD WITHOUT CONTRAST TECHNIQUE: Multiplanar, multiecho pulse sequences of the brain and surrounding structures were obtained without intravenous contrast. COMPARISON:  CT head without contrast and CTA head and neck 07/20/2021. FINDINGS: Brain: There are scattered small - generally punctate - foci of diffusion restriction in both cerebral hemispheres. Most are in the left hemisphere involving the left MCA and left PCA territory. Among the most conspicuous is a 6 mm focus at the left external capsule abutting the lentiform (series 5, image 87 and series 7, image 70). Small involvement also at the right genu of the corpus callosum (right ACA territory). Occasional right MCA involvement. Subtle T2 and FLAIR hyperintensity associated with the acute findings. With moderate superimposed widely scattered background white matter T2 and FLAIR hyperintensity. And mild to moderate associated  heterogeneity in the bilateral basal ganglia. But no cortical encephalomalacia identified. Trace if any chronic cerebral blood products. Mild susceptibility artifact at the left ICA terminus related to endovascular embolization. No restricted diffusion to suggest acute infarction. No midline shift, mass effect, evidence of mass lesion, ventriculomegaly, extra-axial collection or acute intracranial hemorrhage. Vascular: Major intracranial vascular flow voids are preserved. Skull and upper cervical spine: Negative visible cervical spine. Visualized bone marrow signal is within normal limits. Sinuses/Orbits: Postoperative changes to the left globe. Otherwise negative orbits. Paranasal Visualized paranasal sinuses and mastoids are stable and well aerated. Other: Mastoids are  well aerated.  Negative visible scalp and face. IMPRESSION: 1. 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 Among the most conspicuous is a 6 mm lacune in the left external capsule abutting the lentiform. No associated hemorrhage or mass effect. 2. Underlying moderate for age signal changes in the brain compatible with chronic small vessel disease. Electronically Signed   By: Genevie Ann M.D.   On: 07/21/2021 05:30   IR Transcath/Emboliz  Result Date: 07/20/2021 INDICATION: Kasmira Cacioppo Woods is a 61 year old female with past medical history significant for diabetes, hyperlipidemia, hypertension and cataract. She presented to the emergency room with headache on 06/05/2021. 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 She underwent a diagnostic cerebral angiogram on 06/19/2021 that confirmed the presence of the 2 brain aneurysms seen on prior CTA. She comes to our service today for elective treatment of her left ICA aneurysm. In preparation to today's procedure, she was started on Brilinta 90 mg b.i.d. and aspirin 81 mg q.d. EXAM: ULTRASOUND-GUIDED VASCULAR ACCESS DIAGNOSTIC CEREBRAL ANGIOGRAM ENDOVASCULAR ANEURYSM EMBOLIZATION FLAT PANEL HEAD CT COMPARISON:  Cerebral angiogram June 18, 2021. MEDICATIONS: Ancef 2 g IV. The antibiotic was administered within 1 hour of the procedure. ANESTHESIA/SEDATION: The study was performed in the general anesthesia. CONTRAST:  106 cc of Omnipaque 300 milligram/mL FLUOROSCOPY TIME:  Fluoroscopy Time: 42 minutes 48 seconds (1,843 mGy). COMPLICATIONS: Delayed - approximately one hour after procedure, patient developed left sided weakness, unclear whether procedure related given intervention in the left ICA. TECHNIQUE: Informed written consent was obtained from the patient after a thorough discussion of the procedural risks, benefits and alternatives. All  questions were addressed. Maximal Sterile Barrier Technique was utilized including caps, mask, sterile gowns, sterile gloves, sterile drape, hand hygiene and skin antiseptic. A timeout was performed prior to the initiation of the procedure. Using the modified Seldinger technique and a micropuncture kit, access was gained to the distal right radial artery at the anatomical snuffbox and a 7 French sheath was placed. Real-time ultrasound guidance was utilized for vascular access including the acquisition of a permanent ultrasound image documenting patency of the accessed vessel. Slow intra arterial infusion of 5,000 IU heparin, 5 mg Verapamil and 200 mcg nitroglycerin diluted in patient's own blood was performed. No significant fluctuation in patient's blood pressure seen. Then, a right radial artery angiogram was obtained via sheath side port. Next, a 5 [place] Simmons 2 glide catheter was navigated over a 0.035" Terumo Glidewire into the right subclavian artery under fluoroscopic guidance. The catheter was then advanced into the left common carotid artery. Frontal and lateral angiograms of the neck were obtained. Using biplane roadmap guidance, the catheter was then advanced into the left internal carotid artery. Frontal, lateral, magnified waters and magnified lateral views of the head were obtained.  FINDINGS: 1. Normal brachial artery branching pattern seen. No significant anatomical variation. The right radial artery caliber is adequate for vascular access. 2. 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 3. There is brisk vascular contrast filling of the bilateral ACA and left MCA vascular trees. The visualized dural sinuses are patent. PROCEDURE: The Simmons 2 glide catheter was exchanged over the wire and under biplane roadmap for a 7 [place] Rist catheter which was placed in the distal cervical segment of the left ICA. Magnified frontal and lateral angiograms of the  head were obtained in the working projections. Then, a [place] EX intermediate catheter was navigated through the risk catheter and over a via 17 microcatheter and a synchro 2 micro guidewire into the cavernous segment of the right ICA. Magnified frontal and lateral angiograms of the head were obtained in the working projections. The via microcatheter was then navigated over the wire into the left ICA terminus aneurysm pouch. The wire was removed. Then, a 4 x 2 mm web SL device was deployed into the aneurysm pouch. Frontal and lateral magnified angiograms of the head were obtained in the working projections. Adequate positioning of the device was noted. The device was then detached under fluoroscopy. Follow-up left internal carotid artery angiograms with magnified frontal and lateral views of the head in the working projections showed stable position of the device within the aneurysm pouch. Left ICA angiograms with frontal and lateral views of the entire head were then obtained, showed no evidence of thromboembolic complication. Flat panel CT of the head was obtained and post processed in a separate workstation with concurrent attending physician supervision. Selected images were sent to PACS. No evidence of hemorrhagic complication noted. The catheter was subsequently withdrawn. An inflatable band was placed and inflated over the right hand access site. The vascular sheath was withdrawn and the band was slowly deflated until brisk flow was noted through the arteriotomy site. At this point, the band was reinflated with additional 2 cc of air to obtain patent hemostasis. 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 No evidence of hemorrhagic of thromboembolic complication. PLAN: Patient will be admitted to ICU for observation. Electronically Signed   By: Pedro Earls M.D.   On: 07/20/2021 15:53   IR Angiogram Follow Up Study  Result Date:  07/20/2021 INDICATION: Shelma Eiben Zeringue is a 61 year old female with past medical history significant for diabetes, hyperlipidemia, hypertension and cataract. She presented to the emergency room with headache on 06/05/2021. 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 She underwent a diagnostic cerebral angiogram on 06/19/2021 that confirmed the presence of the 2 brain aneurysms seen on prior CTA. She comes to our service today for elective treatment of her left ICA aneurysm. In preparation to today's procedure, she was started on Brilinta 90 mg b.i.d. and aspirin 81 mg q.d. EXAM: ULTRASOUND-GUIDED VASCULAR ACCESS DIAGNOSTIC CEREBRAL ANGIOGRAM ENDOVASCULAR ANEURYSM EMBOLIZATION FLAT PANEL HEAD CT COMPARISON:  Cerebral angiogram June 18, 2021. MEDICATIONS: Ancef 2 g IV. The antibiotic was administered within 1 hour of the procedure. ANESTHESIA/SEDATION: The study was performed in the general anesthesia. CONTRAST:  106 cc of Omnipaque 300 milligram/mL FLUOROSCOPY TIME:  Fluoroscopy Time: 42 minutes 48 seconds (1,843 mGy). COMPLICATIONS: Delayed - approximately one hour after procedure, patient developed left sided weakness, unclear whether procedure related  given intervention in the left ICA. TECHNIQUE: Informed written consent was obtained from the patient after a thorough discussion of the procedural risks, benefits and alternatives. All questions were addressed. Maximal Sterile Barrier Technique was utilized including caps, mask, sterile gowns, sterile gloves, sterile drape, hand hygiene and skin antiseptic. A timeout was performed prior to the initiation of the procedure. Using the modified Seldinger technique and a micropuncture kit, access was gained to the distal right radial artery at the anatomical snuffbox and a 7 French sheath was placed. Real-time ultrasound guidance was utilized for vascular access including the acquisition  of a permanent ultrasound image documenting patency of the accessed vessel. Slow intra arterial infusion of 5,000 IU heparin, 5 mg Verapamil and 200 mcg nitroglycerin diluted in patient's own blood was performed. No significant fluctuation in patient's blood pressure seen. Then, a right radial artery angiogram was obtained via sheath side port. Next, a 5 [place] Simmons 2 glide catheter was navigated over a 0.035" Terumo Glidewire into the right subclavian artery under fluoroscopic guidance. The catheter was then advanced into the left common carotid artery. Frontal and lateral angiograms of the neck were obtained. Using biplane roadmap guidance, the catheter was then advanced into the left internal carotid artery. Frontal, lateral, magnified waters and magnified lateral views of the head were obtained. FINDINGS: 1. Normal brachial artery branching pattern seen. No significant anatomical variation. The right radial artery caliber is adequate for vascular access. 2. 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 3. There is brisk vascular contrast filling of the bilateral ACA and left MCA vascular trees. The visualized dural sinuses are patent. PROCEDURE: The Simmons 2 glide catheter was exchanged over the wire and under biplane roadmap for a 7 [place] Rist catheter which was placed in the distal cervical segment of the left ICA. Magnified frontal and lateral angiograms of the head were obtained in the working projections. Then, a [place] EX intermediate catheter was navigated through the risk catheter and over a via 17 microcatheter and a synchro 2 micro guidewire into the cavernous segment of the right ICA. Magnified frontal and lateral angiograms of the head were obtained in the working projections. The via microcatheter was then navigated over the wire into the left ICA terminus aneurysm pouch. The wire was removed. Then, a 4 x 2 mm web SL device was deployed into the  aneurysm pouch. Frontal and lateral magnified angiograms of the head were obtained in the working projections. Adequate positioning of the device was noted. The device was then detached under fluoroscopy. Follow-up left internal carotid artery angiograms with magnified frontal and lateral views of the head in the working projections showed stable position of the device within the aneurysm pouch. Left ICA angiograms with frontal and lateral views of the entire head were then obtained, showed no evidence of thromboembolic complication. Flat panel CT of the head was obtained and post processed in a separate workstation with concurrent attending physician supervision. Selected images were sent to PACS. No evidence of hemorrhagic complication noted. The catheter was subsequently withdrawn. An inflatable band was placed and inflated over the right hand access site. The vascular sheath was withdrawn and the band was slowly deflated until brisk flow was noted through the arteriotomy site. At this point, the band was reinflated with additional 2 cc of air to obtain patent hemostasis. 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 No evidence of hemorrhagic of  thromboembolic complication. PLAN: Patient will be admitted to ICU for observation. Electronically Signed   By: Pedro Earls M.D.   On: 07/20/2021 15:53   IR CT Head Ltd  Result Date: 07/20/2021 INDICATION: Elizzie Westergard Kovacich is a 61 year old female with past medical history significant for diabetes, hyperlipidemia, hypertension and cataract. She presented to the emergency room with headache on 06/05/2021. 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 She underwent a diagnostic cerebral angiogram on 06/19/2021 that confirmed the presence of the 2 brain aneurysms seen on prior CTA. She comes to our service today for elective  treatment of her left ICA aneurysm. In preparation to today's procedure, she was started on Brilinta 90 mg b.i.d. and aspirin 81 mg q.d. EXAM: ULTRASOUND-GUIDED VASCULAR ACCESS DIAGNOSTIC CEREBRAL ANGIOGRAM ENDOVASCULAR ANEURYSM EMBOLIZATION FLAT PANEL HEAD CT COMPARISON:  Cerebral angiogram June 18, 2021. MEDICATIONS: Ancef 2 g IV. The antibiotic was administered within 1 hour of the procedure. ANESTHESIA/SEDATION: The study was performed in the general anesthesia. CONTRAST:  106 cc of Omnipaque 300 milligram/mL FLUOROSCOPY TIME:  Fluoroscopy Time: 42 minutes 48 seconds (1,843 mGy). COMPLICATIONS: Delayed - approximately one hour after procedure, patient developed left sided weakness, unclear whether procedure related given intervention in the left ICA. TECHNIQUE: Informed written consent was obtained from the patient after a thorough discussion of the procedural risks, benefits and alternatives. All questions were addressed. Maximal Sterile Barrier Technique was utilized including caps, mask, sterile gowns, sterile gloves, sterile drape, hand hygiene and skin antiseptic. A timeout was performed prior to the initiation of the procedure. Using the modified Seldinger technique and a micropuncture kit, access was gained to the distal right radial artery at the anatomical snuffbox and a 7 French sheath was placed. Real-time ultrasound guidance was utilized for vascular access including the acquisition of a permanent ultrasound image documenting patency of the accessed vessel. Slow intra arterial infusion of 5,000 IU heparin, 5 mg Verapamil and 200 mcg nitroglycerin diluted in patient's own blood was performed. No significant fluctuation in patient's blood pressure seen. Then, a right radial artery angiogram was obtained via sheath side port. Next, a 5 [place] Simmons 2 glide catheter was navigated over a 0.035" Terumo Glidewire into the right subclavian artery under fluoroscopic guidance. The catheter was then  advanced into the left common carotid artery. Frontal and lateral angiograms of the neck were obtained. Using biplane roadmap guidance, the catheter was then advanced into the left internal carotid artery. Frontal, lateral, magnified waters and magnified lateral views of the head were obtained. FINDINGS: 1. Normal brachial artery branching pattern seen. No significant anatomical variation. The right radial artery caliber is adequate for vascular access. 2. 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 3. There is brisk vascular contrast filling of the bilateral ACA and left MCA vascular trees. The visualized dural sinuses are patent. PROCEDURE: The Simmons 2 glide catheter was exchanged over the wire and under biplane roadmap for a 7 [place] Rist catheter which was placed in the distal cervical segment of the left ICA. Magnified frontal and lateral angiograms of the head were obtained in the working projections. Then, a [place] EX intermediate catheter was navigated through the risk catheter and over a via 17 microcatheter and a synchro 2 micro guidewire into the cavernous segment of the right ICA. Magnified frontal and lateral angiograms of the head were  obtained in the working projections. The via microcatheter was then navigated over the wire into the left ICA terminus aneurysm pouch. The wire was removed. Then, a 4 x 2 mm web SL device was deployed into the aneurysm pouch. Frontal and lateral magnified angiograms of the head were obtained in the working projections. Adequate positioning of the device was noted. The device was then detached under fluoroscopy. Follow-up left internal carotid artery angiograms with magnified frontal and lateral views of the head in the working projections showed stable position of the device within the aneurysm pouch. Left ICA angiograms with frontal and lateral views of the entire head were then obtained, showed no evidence of  thromboembolic complication. Flat panel CT of the head was obtained and post processed in a separate workstation with concurrent attending physician supervision. Selected images were sent to PACS. No evidence of hemorrhagic complication noted. The catheter was subsequently withdrawn. An inflatable band was placed and inflated over the right hand access site. The vascular sheath was withdrawn and the band was slowly deflated until brisk flow was noted through the arteriotomy site. At this point, the band was reinflated with additional 2 cc of air to obtain patent hemostasis. 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 No evidence of hemorrhagic of thromboembolic complication. PLAN: Patient will be admitted to ICU for observation. Electronically Signed   By: Pedro Earls M.D.   On: 07/20/2021 15:53   ECHOCARDIOGRAM COMPLETE  Result Date: 07/20/2021    ECHOCARDIOGRAM REPORT   Patient Name:   TATIONNA FULLARD Date of Exam: 07/20/2021 Medical Rec #:  [id number]        Height:       66.0 in Accession #:    [id number]       Weight:       231.0 lb Date of Birth:  [birth year]        BSA:          2.126 m Patient Age:    35 years         BP:           138/80 mmHg Patient Gender: F                HR:           66 bpm. Exam Location:  Inpatient Procedure: 2D Echo Indications:    Stroke  History:        Patient has prior history of Echocardiogram examinations, most                 recent 10/30/2020. Abnormal ECG; Risk Factors:Diabetes,                 Hypertension and Dyslipidemia.  Sonographer:    Arlyss Gandy Referring Phys: [id number] [place]  1. Left ventricular ejection fraction, by estimation, is 65 to 70%. Left ventricular ejection fraction by PLAX is 66 %. The left ventricle has normal function. The left ventricle has no regional wall motion abnormalities. There is moderate asymmetric left ventricular hypertrophy of the  basal-septal segment. Left ventricular diastolic parameters are consistent with Grade I diastolic dysfunction (impaired relaxation).  2. Right ventricular systolic function is normal. The right ventricular size is normal.  3. The mitral valve is grossly normal. Trivial mitral valve regurgitation.  4. The aortic valve is tricuspid. Aortic valve regurgitation is not visualized. Aortic valve sclerosis/calcification is present, without any evidence of aortic stenosis.  5. The  inferior vena cava is normal in size with greater than 50% respiratory variability, suggesting right atrial pressure of 3 mmHg. Comparison(s): No prior Echocardiogram. 10/30/2020: LVEF 70-75%. FINDINGS  Left Ventricle: Left ventricular ejection fraction, by estimation, is 65 to 70%. Left ventricular ejection fraction by PLAX is 66 %. The left ventricle has normal function. The left ventricle has no regional wall motion abnormalities. The left ventricular internal cavity size was normal in size. There is moderate asymmetric left ventricular hypertrophy of the basal-septal segment. Left ventricular diastolic parameters are consistent with Grade I diastolic dysfunction (impaired relaxation). Indeterminate filling pressures. Right Ventricle: The right ventricular size is normal. No increase in right ventricular wall thickness. Right ventricular systolic function is normal. Left Atrium: Left atrial size was normal in size. Right Atrium: Right atrial size was normal in size. Pericardium: There is no evidence of pericardial effusion. Mitral Valve: The mitral valve is grossly normal. Trivial mitral valve regurgitation. Tricuspid Valve: The tricuspid valve is grossly normal. Tricuspid valve regurgitation is trivial. Aortic Valve: The aortic valve is tricuspid. Aortic valve regurgitation is not visualized. Aortic valve sclerosis/calcification is present, without any evidence of aortic stenosis. Aortic valve mean gradient measures 7.0 mmHg. Aortic valve peak  gradient measures 9.7 mmHg. Aortic valve area, by VTI measures 2.70 cm. Pulmonic Valve: The pulmonic valve was grossly normal. Pulmonic valve regurgitation is trivial. Aorta: The aortic root and ascending aorta are structurally normal, with no evidence of dilitation. Venous: The inferior vena cava is normal in size with greater than 50% respiratory variability, suggesting right atrial pressure of 3 mmHg. IAS/Shunts: No atrial level shunt detected by color flow Doppler.  LEFT VENTRICLE PLAX 2D LV EF:         Left            Diastology                ventricular     LV e' medial:    5.87 cm/s                ejection        LV E/e' medial:  12.0                fraction by     LV e' lateral:   8.70 cm/s                PLAX is 66      LV E/e' lateral: 8.1                %. LVIDd:         3.87 cm LVIDs:         2.49 cm LV PW:         1.22 cm LV IVS:        1.55 cm LVOT diam:     2.00 cm LV SV:         108 LV SV Index:   51 LVOT Area:     3.14 cm  RIGHT VENTRICLE RV S prime:     16.80 cm/s TAPSE (M-mode): 2.3 cm LEFT ATRIUM             Index LA diam:        2.70 cm 1.27 cm/m LA Vol (A2C):   55.4 ml 26.06 ml/m LA Vol (A4C):   34.1 ml 16.04 ml/m LA Biplane Vol: 44.3 ml 20.84 ml/m  AORTIC VALVE AV Area (Vmax):    2.94 cm AV Area (Vmean):   2.56  cm AV Area (VTI):     2.70 cm AV Vmax:           156.00 cm/s AV Vmean:          125.000 cm/s AV VTI:            0.399 m AV Peak Grad:      9.7 mmHg AV Mean Grad:      7.0 mmHg LVOT Vmax:         146.00 cm/s LVOT Vmean:        102.000 cm/s LVOT VTI:          0.343 m LVOT/AV VTI ratio: 0.86  AORTA Ao Root diam: 3.20 cm Ao Asc diam:  3.30 cm MITRAL VALVE MV Area (PHT): 2.12 cm     SHUNTS MV Decel Time: 357 msec     Systemic VTI:  0.34 m MV E velocity: 70.30 cm/s   Systemic Diam: 2.00 cm MV A velocity: 103.00 cm/s MV E/A ratio:  0.68 Lyman Bishop MD Electronically signed by Lyman Bishop MD Signature Date/Time: 07/20/2021/3:41:45 PM    Final    CT HEAD CODE STROKE WO  CONTRAST`  Result Date: 07/20/2021 CLINICAL DATA:  Code stroke. EXAM: CT HEAD WITHOUT CONTRAST TECHNIQUE: Contiguous axial images were obtained from the base of the skull through the vertex without intravenous contrast. RADIATION DOSE REDUCTION: This exam was performed according to the departmental dose-optimization program which includes automated exposure control, adjustment of the mA and/or kV according to patient size and/or use of iterative reconstruction technique. COMPARISON:  06/08/2021 FINDINGS: Brain: No evidence of acute infarction, hemorrhage, cerebral edema, mass, mass effect, or midline shift. Ventricles and sulci are normal for age. No extra-axial fluid collection. Periventricular white matter changes, likely the sequela of chronic small vessel ischemic disease. Vascular: No hyperdense vessel or unexpected calcification. Skull: Normal. Negative for fracture or focal lesion. Sinuses/Orbits: Mucosal thickening in the maxillary sinuses. Status post left lens replacement. Other: The mastoid air cells are well aerated. ASPECTS [facility] Stroke Program Early CT Score) - Ganglionic level infarction (caudate, lentiform nuclei, internal capsule, insula, M1-M3 cortex): 7 - Supraganglionic infarction (M4-M6 cortex): 3 Total score (0-10 with 10 being normal): 10 IMPRESSION: 1. No acute intracranial process. 2. ASPECTS is 10 Code stroke imaging results were communicated on 07/20/2021 at 11:56 am to provider Dr. Lorrin Goodell via secure text paging. Electronically Signed   By: Merilyn Baba M.D.   On: 07/20/2021 11:57   CT ANGIO HEAD NECK W WO CM (CODE STROKE)  Result Date: 07/20/2021 CLINICAL DATA:  Neuro deficit, stroke suspected EXAM: CT ANGIOGRAPHY HEAD AND NECK TECHNIQUE: Multidetector CT imaging of the head and neck was performed using the standard protocol during bolus administration of intravenous contrast. Multiplanar CT image reconstructions and MIPs were obtained to evaluate the vascular anatomy.  Carotid stenosis measurements (when applicable) are obtained utilizing NASCET criteria, using the distal internal carotid diameter as the denominator. RADIATION DOSE REDUCTION: This exam was performed according to the departmental dose-optimization program which includes automated exposure control, adjustment of the mA and/or kV according to patient size and/or use of iterative reconstruction technique. CONTRAST:  75m OMNIPAQUE IOHEXOL 350 MG/ML SOLN COMPARISON:  06/05/2021 CTA head, correlation is also made with same day CT head FINDINGS: CT HEAD FINDINGS For noncontrast findings, please see same day CT head. CTA NECK FINDINGS Aortic arch: Two-vessel arch with a common origin of the brachiocephalic and left common carotid arteries. Imaged portion shows no evidence of aneurysm or dissection. No  significant stenosis of the major arch vessel origins. Right carotid system: No evidence of dissection, stenosis (50% or greater) or occlusion. Calcified and noncalcified plaque at the bifurcation, which is not hemodynamically significant. Left carotid system: No evidence of dissection, stenosis (50% or greater) or occlusion. Calcified and noncalcified plaque at the bifurcation which is not hemodynamically significant. Vertebral arteries: Codominant. No evidence of dissection, stenosis (50% or greater) or occlusion. Skeleton: No acute osseous abnormality. Other neck: Negative. Upper chest: No focal pulmonary opacity or pleural effusion. Review of the MIP images confirms the above findings CTA HEAD FINDINGS Anterior circulation: Both internal carotid arteries are patent to the termini. Unchanged 3 mm supero medially projecting aneurysm arising from the distal cavernous right ICA (series 7, image 244). Interval coiling of the previously noted superiorly projecting aneurysm arising from the left ICA terminus at the origin of the left ACA and MCA (series 7, image 224). A1 segments patent, with redemonstrated hypoplastic right  A1. Normal anterior communicating artery. Anterior cerebral arteries are patent to their distal aspects. No M1 stenosis or occlusion. Normal MCA bifurcations. Distal MCA branches perfused and symmetric. Posterior circulation: Vertebral arteries patent to the vertebrobasilar junction without stenosis. Posterior inferior cerebral arteries patent bilaterally. Basilar patent to its distal aspect. Superior cerebellar arteries patent bilaterally. Bilateral P1 segments originate from the basilar artery. PCAs perfused to their distal aspects without stenosis. The bilateral posterior communicating arteries are patent. Venous sinuses: As permitted by contrast timing, patent. Anatomic variants: None significant Review of the MIP images confirms the above findings IMPRESSION: 1. 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 2.  No intracranial large vessel occlusion or significant stenosis. 3.  No hemodynamically significant stenosis in the neck. 4. Unchanged 3 mm aneurysm projecting from the distal cavernous right ICA. Code stroke imaging results were communicated on 07/20/2021 at 12:39 pm to provider Dr. Lorrin Goodell via secure text paging. Electronically Signed   By: Merilyn Baba M.D.   On: 07/20/2021 12:40   IR ANGIO INTRA EXTRACRAN SEL INTERNAL CAROTID UNI R MOD SED  Result Date: 07/20/2021 INDICATION: Careena Degraffenreid Martus is a 61 year old female with past medical history significant for diabetes, hyperlipidemia, hypertension and cataract. She presented to the emergency room with headache on 06/05/2021. 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 She underwent a diagnostic cerebral angiogram on 06/19/2021 that confirmed the presence of the 2 brain aneurysms seen on prior CTA. She comes to our service today for elective treatment of her left ICA aneurysm. In preparation to today's procedure, she was started on Brilinta 90 mg b.i.d.  and aspirin 81 mg q.d. EXAM: ULTRASOUND-GUIDED VASCULAR ACCESS DIAGNOSTIC CEREBRAL ANGIOGRAM ENDOVASCULAR ANEURYSM EMBOLIZATION FLAT PANEL HEAD CT COMPARISON:  Cerebral angiogram June 18, 2021. MEDICATIONS: Ancef 2 g IV. The antibiotic was administered within 1 hour of the procedure. ANESTHESIA/SEDATION: The study was performed in the general anesthesia. CONTRAST:  106 cc of Omnipaque 300 milligram/mL FLUOROSCOPY TIME:  Fluoroscopy Time: 42 minutes 48 seconds (1,843 mGy). COMPLICATIONS: Delayed - approximately one hour after procedure, patient developed left sided weakness, unclear whether procedure related given intervention in the left ICA. TECHNIQUE: Informed written consent was obtained from the patient after a thorough discussion of the procedural risks, benefits and alternatives. All questions were addressed. Maximal Sterile Barrier Technique was utilized including caps, mask, sterile gowns, sterile gloves, sterile drape, hand hygiene and skin antiseptic. A timeout was performed  prior to the initiation of the procedure. Using the modified Seldinger technique and a micropuncture kit, access was gained to the distal right radial artery at the anatomical snuffbox and a 7 French sheath was placed. Real-time ultrasound guidance was utilized for vascular access including the acquisition of a permanent ultrasound image documenting patency of the accessed vessel. Slow intra arterial infusion of 5,000 IU heparin, 5 mg Verapamil and 200 mcg nitroglycerin diluted in patient's own blood was performed. No significant fluctuation in patient's blood pressure seen. Then, a right radial artery angiogram was obtained via sheath side port. Next, a 5 [place] Simmons 2 glide catheter was navigated over a 0.035" Terumo Glidewire into the right subclavian artery under fluoroscopic guidance. The catheter was then advanced into the left common carotid artery. Frontal and lateral angiograms of the neck were obtained. Using biplane  roadmap guidance, the catheter was then advanced into the left internal carotid artery. Frontal, lateral, magnified waters and magnified lateral views of the head were obtained. FINDINGS: 1. Normal brachial artery branching pattern seen. No significant anatomical variation. The right radial artery caliber is adequate for vascular access. 2. 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 3. There is brisk vascular contrast filling of the bilateral ACA and left MCA vascular trees. The visualized dural sinuses are patent. PROCEDURE: The Simmons 2 glide catheter was exchanged over the wire and under biplane roadmap for a 7 [place] Rist catheter which was placed in the distal cervical segment of the left ICA. Magnified frontal and lateral angiograms of the head were obtained in the working projections. Then, a [place] EX intermediate catheter was navigated through the risk catheter and over a via 17 microcatheter and a synchro 2 micro guidewire into the cavernous segment of the right ICA. Magnified frontal and lateral angiograms of the head were obtained in the working projections. The via microcatheter was then navigated over the wire into the left ICA terminus aneurysm pouch. The wire was removed. Then, a 4 x 2 mm web SL device was deployed into the aneurysm pouch. Frontal and lateral magnified angiograms of the head were obtained in the working projections. Adequate positioning of the device was noted. The device was then detached under fluoroscopy. Follow-up left internal carotid artery angiograms with magnified frontal and lateral views of the head in the working projections showed stable position of the device within the aneurysm pouch. Left ICA angiograms with frontal and lateral views of the entire head were then obtained, showed no evidence of thromboembolic complication. Flat panel CT of the head was obtained and post processed in a separate workstation with concurrent  attending physician supervision. Selected images were sent to PACS. No evidence of hemorrhagic complication noted. The catheter was subsequently withdrawn. An inflatable band was placed and inflated over the right hand access site. The vascular sheath was withdrawn and the band was slowly deflated until brisk flow was noted through the arteriotomy site. At this point, the band was reinflated with additional 2 cc of air to obtain patent hemostasis. 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 No evidence of hemorrhagic of thromboembolic complication. PLAN: Patient will be admitted to ICU for observation. Electronically Signed   By: Pedro Earls M.D.   On: 07/20/2021 15:53    Labs:  CBC: Recent Labs    06/05/21 0720 06/08/21 0829 06/18/21 0950 07/20/21 0642  WBC 5.0 5.5 5.2 6.0  HGB  11.4* 12.7 12.4 11.9*  HCT 35.7* 40.7 38.8 36.5  PLT 196 231 251 252    COAGS: Recent Labs    06/18/21 0950 07/20/21 0642 07/20/21 1234  INR 1.1 0.9 1.1  APTT  --   --  152*    BMP: Recent Labs    06/05/21 0720 06/08/21 0829 06/18/21 0950 07/07/21 1346 07/20/21 0642  NA 139 137 139 140 139  K 3.8 3.8 3.9 4.0 4.1  CL 105 105 106 106 103  CO2 _0 GLUCOSE 144* 160* 136* 211* 210*  BUN _1 CALCIUM 9.7 9.3 9.6 9.5 9.7  CREATININE 0.82 0.82 0.88 0.83 1.00  GFRNONAA >60 >60 >60  --  >60    LIVER FUNCTION TESTS: Recent Labs    09/05/20 1522 06/08/21 0829 07/07/21 1346  BILITOT 0.4 0.4 0.4  AST _2 ALT _3 ALKPHOS 80 49 39  PROT 7.6 7.6 7.6  ALBUMIN 4.0 3.9 4.0    Assessment and Plan:    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 Right hand swelling and left-sided weakness continues to improve. She has been approved for inpatient rehab - insurance authorization pending. 81  mg aspirin daily has been restarted. Patient does not need to take Brilinta.   NIR will continue to follow. Please call NIR with any questions.   Electronically Signed: Soyla Dryer, AGACNP-BC [phone] 07/22/2021, 2:04 PM   I spent a total of 15 Minutes at the the patient's bedside AND on the patient's hospital floor or unit, greater than 50% of which was counseling/coordinating care for left ICA aneurysm s/p embolization.

## 2021-07-22 NOTE — TOC CAGE-AID Note (Signed)
Transition of Care [facility]) - CAGE-AID Screening   Patient Details  Name: Shannon Oliver MRN: [id number] Date of Birth: [birth year]  Transition of Care [facility]) CM/SW Contact:    Jaimey Franchini C Tarpley-Carter, [place] Phone Number: 07/22/2021, 12:30 PM   Clinical Narrative: Pt participated in [place].  Pt stated she does not use substance or ETOH.  Pt was not offered resources, due to no usage of substance or ETOH.    Raynell Upton Tarpley-Carter, MSW, LCSW-A Pronouns:  She/Her/Hers [place] Transitions of Care Clinical Social Worker Direct Number:  [phone] [email or web address]  CAGE-AID Screening:    Have You Ever Felt You Ought to Cut Down on Your Drinking or Drug Use?: No Have People Annoyed You By [employer] Your Drinking Or Drug Use?: No Have You Felt Bad Or Guilty About Your Drinking Or Drug Use?: No Have You Ever Had a Drink or Used Drugs First Thing In The Morning to Steady Your Nerves or to Get Rid of a Hangover?: No CAGE-AID Score: 0  Substance Abuse Education Offered: No

## 2021-07-23 ENCOUNTER — Encounter (HOSPITAL_COMMUNITY): Payer: Self-pay | Admitting: Physical Medicine & Rehabilitation

## 2021-07-23 ENCOUNTER — Other Ambulatory Visit: Payer: Self-pay

## 2021-07-23 ENCOUNTER — Inpatient Hospital Stay (HOSPITAL_COMMUNITY)
Admission: RE | Admit: 2021-07-23 | Discharge: 2021-08-01 | DRG: 057 | Disposition: A | Discharge status: Home Health | Payer: BC Managed Care – PPO | Source: Intra-hospital | Attending: Physical Medicine & Rehabilitation | Admitting: Physical Medicine & Rehabilitation

## 2021-07-23 DIAGNOSIS — H538 Other visual disturbances: Secondary | ICD-10-CM | POA: Diagnosis present

## 2021-07-23 DIAGNOSIS — K219 Gastro-esophageal reflux disease without esophagitis: Secondary | ICD-10-CM | POA: Diagnosis present

## 2021-07-23 DIAGNOSIS — E669 Obesity, unspecified: Secondary | ICD-10-CM | POA: Diagnosis present

## 2021-07-23 DIAGNOSIS — Z79899 Other long term (current) drug therapy: Secondary | ICD-10-CM

## 2021-07-23 DIAGNOSIS — I671 Cerebral aneurysm, nonruptured: Secondary | ICD-10-CM | POA: Diagnosis present

## 2021-07-23 DIAGNOSIS — Z833 Family history of diabetes mellitus: Secondary | ICD-10-CM | POA: Diagnosis not present

## 2021-07-23 DIAGNOSIS — J45909 Unspecified asthma, uncomplicated: Secondary | ICD-10-CM | POA: Diagnosis present

## 2021-07-23 DIAGNOSIS — G4700 Insomnia, unspecified: Secondary | ICD-10-CM | POA: Diagnosis not present

## 2021-07-23 DIAGNOSIS — I69398 Other sequelae of cerebral infarction: Secondary | ICD-10-CM

## 2021-07-23 DIAGNOSIS — Z8249 Family history of ischemic heart disease and other diseases of the circulatory system: Secondary | ICD-10-CM | POA: Diagnosis not present

## 2021-07-23 DIAGNOSIS — Z7984 Long term (current) use of oral hypoglycemic drugs: Secondary | ICD-10-CM | POA: Diagnosis not present

## 2021-07-23 DIAGNOSIS — E119 Type 2 diabetes mellitus without complications: Secondary | ICD-10-CM | POA: Diagnosis present

## 2021-07-23 DIAGNOSIS — Z7982 Long term (current) use of aspirin: Secondary | ICD-10-CM

## 2021-07-23 DIAGNOSIS — E1169 Type 2 diabetes mellitus with other specified complication: Secondary | ICD-10-CM | POA: Diagnosis not present

## 2021-07-23 DIAGNOSIS — Z888 Allergy status to other drugs, medicaments and biological substances status: Secondary | ICD-10-CM | POA: Diagnosis not present

## 2021-07-23 DIAGNOSIS — G4733 Obstructive sleep apnea (adult) (pediatric): Secondary | ICD-10-CM | POA: Diagnosis present

## 2021-07-23 DIAGNOSIS — I69390 Apraxia following cerebral infarction: Secondary | ICD-10-CM

## 2021-07-23 DIAGNOSIS — Z6837 Body mass index (BMI) 37.0-37.9, adult: Secondary | ICD-10-CM

## 2021-07-23 DIAGNOSIS — I10 Essential (primary) hypertension: Secondary | ICD-10-CM | POA: Diagnosis present

## 2021-07-23 DIAGNOSIS — I6330 Cerebral infarction due to thrombosis of unspecified cerebral artery: Secondary | ICD-10-CM | POA: Insufficient documentation

## 2021-07-23 DIAGNOSIS — Z803 Family history of malignant neoplasm of breast: Secondary | ICD-10-CM

## 2021-07-23 DIAGNOSIS — Z7902 Long term (current) use of antithrombotics/antiplatelets: Secondary | ICD-10-CM

## 2021-07-23 DIAGNOSIS — E785 Hyperlipidemia, unspecified: Secondary | ICD-10-CM | POA: Diagnosis present

## 2021-07-23 DIAGNOSIS — Z794 Long term (current) use of insulin: Secondary | ICD-10-CM

## 2021-07-23 DIAGNOSIS — I69351 Hemiplegia and hemiparesis following cerebral infarction affecting right dominant side: Secondary | ICD-10-CM | POA: Diagnosis present

## 2021-07-23 DIAGNOSIS — Z885 Allergy status to narcotic agent status: Secondary | ICD-10-CM

## 2021-07-23 LAB — GLUCOSE, CAPILLARY
Glucose-Capillary: 220 mg/dL — ABNORMAL HIGH (ref 70–99)
Glucose-Capillary: 237 mg/dL — ABNORMAL HIGH (ref 70–99)
Glucose-Capillary: 197 mg/dL — ABNORMAL HIGH (ref 70–99)
Glucose-Capillary: 299 mg/dL — ABNORMAL HIGH (ref 70–99)

## 2021-07-23 MED ORDER — ACETAMINOPHEN 650 MG RE SUPP: 650.0000 mg | RECTAL | Status: DC | PRN

## 2021-07-23 MED ORDER — BLOOD PRESSURE CONTROL BOOK
Freq: Once | Status: AC
  Filled 2021-07-23: qty 1

## 2021-07-23 MED ORDER — EXERCISE FOR HEART AND HEALTH BOOK
Freq: Once | Status: AC
  Filled 2021-07-23: qty 1

## 2021-07-23 MED ORDER — ASPIRIN EC 81 MG PO TBEC
81.0000 mg | DELAYED_RELEASE_TABLET | Freq: Every day | ORAL | Status: DC
  Administered 2021-07-24 – 2021-08-01 (×9): 81 mg via ORAL
  Filled 2021-07-23 (×9): qty 1

## 2021-07-23 MED ORDER — ACETAMINOPHEN 325 MG PO TABS
650.0000 mg | ORAL_TABLET | ORAL | Status: DC | PRN
  Administered 2021-07-23 – 2021-07-31 (×15): 650 mg via ORAL
  Filled 2021-07-23 (×15): qty 2

## 2021-07-23 MED ORDER — AMLODIPINE BESYLATE 10 MG PO TABS
10.0000 mg | ORAL_TABLET | Freq: Every day | ORAL | Status: DC
  Administered 2021-07-23: 10 mg via ORAL
  Filled 2021-07-23: qty 1

## 2021-07-23 MED ORDER — CARVEDILOL 25 MG PO TABS
37.5000 mg | ORAL_TABLET | Freq: Two times a day (BID) | ORAL | Status: DC
  Administered 2021-07-24 – 2021-08-01 (×17): 37.5 mg via ORAL
  Filled 2021-07-23 (×17): qty 1

## 2021-07-23 MED ORDER — ROSUVASTATIN CALCIUM 20 MG PO TABS
40.0000 mg | ORAL_TABLET | Freq: Every day | ORAL | Status: DC
Start: 2021-07-24 — End: 2021-08-01
  Administered 2021-07-24 – 2021-08-01 (×9): 40 mg via ORAL
  Filled 2021-07-23 (×9): qty 2

## 2021-07-23 MED ORDER — LOSARTAN POTASSIUM 50 MG PO TABS
100.0000 mg | ORAL_TABLET | Freq: Every day | ORAL | Status: DC
  Administered 2021-07-24 – 2021-08-01 (×9): 100 mg via ORAL
  Filled 2021-07-23 (×9): qty 2

## 2021-07-23 MED ORDER — ASPIRIN 81 MG PO TBEC: 81.0000 mg | DELAYED_RELEASE_TABLET | Freq: Every day | ORAL | 11 refills | Status: AC

## 2021-07-23 MED ORDER — MELATONIN 5 MG PO TABS
5.0000 mg | ORAL_TABLET | Freq: Every evening | ORAL | Status: DC | PRN
  Administered 2021-07-23 – 2021-07-31 (×8): 5 mg via ORAL
  Filled 2021-07-23 (×8): qty 1

## 2021-07-23 MED ORDER — AMLODIPINE BESYLATE 10 MG PO TABS
10.0000 mg | ORAL_TABLET | Freq: Every day | ORAL | Status: DC
  Administered 2021-07-24 – 2021-08-01 (×9): 10 mg via ORAL
  Filled 2021-07-23 (×9): qty 1

## 2021-07-23 MED ORDER — INSULIN ASPART 100 UNIT/ML IJ SOLN
0.0000 [IU] | Freq: Three times a day (TID) | INTRAMUSCULAR | Status: DC
  Administered 2021-07-24 (×2): 2 [IU] via SUBCUTANEOUS
  Administered 2021-07-24: 3 [IU] via SUBCUTANEOUS
  Administered 2021-07-25: 2 [IU] via SUBCUTANEOUS
  Administered 2021-07-25: 1 [IU] via SUBCUTANEOUS
  Administered 2021-07-25: 2 [IU] via SUBCUTANEOUS
  Administered 2021-07-26: 3 [IU] via SUBCUTANEOUS
  Administered 2021-07-26: 2 [IU] via SUBCUTANEOUS
  Administered 2021-07-26: 3 [IU] via SUBCUTANEOUS
  Administered 2021-07-27 (×2): 2 [IU] via SUBCUTANEOUS
  Administered 2021-07-28: 17:00:00 5 [IU] via SUBCUTANEOUS
  Administered 2021-07-28: 06:00:00 2 [IU] via SUBCUTANEOUS
  Administered 2021-07-28 – 2021-07-29 (×2): 1 [IU] via SUBCUTANEOUS
  Administered 2021-07-29 – 2021-07-30 (×2): 2 [IU] via SUBCUTANEOUS
  Administered 2021-07-31: 1 [IU] via SUBCUTANEOUS
  Administered 2021-07-31: 2 [IU] via SUBCUTANEOUS
  Administered 2021-07-31 – 2021-08-01 (×2): 1 [IU] via SUBCUTANEOUS

## 2021-07-23 MED ORDER — SODIUM CHLORIDE (HYPERTONIC) 2 % OP SOLN: 1.0000 [drp] | Freq: Every day | OPHTHALMIC | Status: DC | PRN

## 2021-07-23 MED ORDER — ADULT MULTIVITAMIN W/MINERALS CH
1.0000 | ORAL_TABLET | Freq: Every day | ORAL | Status: DC
  Administered 2021-07-24 – 2021-08-01 (×9): 1 via ORAL
  Filled 2021-07-23 (×9): qty 1

## 2021-07-23 MED ORDER — SENNOSIDES-DOCUSATE SODIUM 8.6-50 MG PO TABS: 1.0000 | ORAL_TABLET | Freq: Every evening | ORAL | Status: DC | PRN

## 2021-07-23 MED ORDER — ACETAMINOPHEN 160 MG/5ML PO SOLN: 650.0000 mg | ORAL | Status: DC | PRN

## 2021-07-23 MED ORDER — PANTOPRAZOLE SODIUM 40 MG PO TBEC
40.0000 mg | DELAYED_RELEASE_TABLET | Freq: Every day | ORAL | Status: DC
  Administered 2021-07-23 – 2021-07-31 (×9): 40 mg via ORAL
  Filled 2021-07-23 (×9): qty 1

## 2021-07-23 NOTE — Progress Notes (Addendum)
STROKE TEAM PROGRESS NOTE   INTERVAL HISTORY Patient is seen in her room with no family at the bedside.   No changes overnight neurological exam is improving.  Vital signs are stable.  Blood pressure adequately controlled, home medications resumed. Right hand and forearm bruising and swelling at the site of the radial artery catheterization is improving. Plan to d/c to CIR when insurance auth is complete.  She is complaining of some difficulty with reading and blurred vision but room is quite been the left is advised to wear her glasses and increase the ambient light and see more clearly.  Right hand pain and swelling continue to decline but are still present. Vitals:   07/22/21 2339 07/23/21 0345 07/23/21 0752 07/23/21 1128  BP: 128/66 135/63 (!) 158/79 137/79  Pulse: 69 65 73 72  Resp: 17 17 18 18   Temp: 98.3 F (36.8 C) 98.6 F (37 C) 98.7 F (37.1 C) 98.6 F (37 C)  TempSrc: Oral Oral Oral Oral  SpO2: 97% 99% 99% 99%  Weight:      Height:       CBC:  Recent Labs  Lab 07/20/21 0642  WBC 6.0  NEUTROABS 3.4  HGB 11.9*  HCT 36.5  MCV 90.6  PLT 149    Basic Metabolic Panel:  Recent Labs  Lab 07/20/21 0642  NA 139  K 4.1  CL 103  CO2 25  GLUCOSE 210*  BUN 20  CREATININE 1.00  CALCIUM 9.7    Lipid Panel:  Recent Labs  Lab 07/21/21 0111  CHOL 129  TRIG 53  HDL 49  CHOLHDL 2.6  VLDL 11  LDLCALC 69    HgbA1c:  Recent Labs  Lab 07/21/21 0111  HGBA1C 8.4*    Urine Drug Screen: No results for input(s): LABOPIA, COCAINSCRNUR, LABBENZ, AMPHETMU, THCU, LABBARB in the last 168 hours.  Alcohol Level No results for input(s): ETH in the last 168 hours.  IMAGING past 24 hours No results found.  PHYSICAL EXAM General:  Alert, well-nourished, well-developed female in no acute distress.  Swelling and bruising of right hand noted but improved from yesterday per patient   NEURO:  Mental Status: AA&Ox3  Speech/Language: speech is without dysarthria or aphasia.   Naming, fluency, and comprehension intact.  Cranial Nerves:  II: PERRL. Visual fields full.  III, IV, VI: EOMI. Eyelids elevate symmetrically.  V: Sensation is intact to light touch and symmetrical to face.  VII: Smile is symmetrical.   VIII: hearing intact to voice. IX, X:  Phonation is normal.  XII: tongue is midline without fasciculations. Motor: 5/5 strength to RUE, RLE and LUE, 4/5 to LLE, drift noted in LLE. Tone: is normal and bulk is normal Sensation- Intact to light touch bilaterally.   Coordination: FTN intact bilaterally, HKS: no ataxia in BLE.No drift.  Gait- deferred   ASSESSMENT/PLAN [name] is a 61 y.o. 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 After the procedure, she developed new onset left sided weakness and decreased sensation after the procedure.  TNKase was administered with improvement of symptoms.  MRI demonstrates scattered, mostly punctate, acute infarcts in bilateral cerebral hemispheres.  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 ASPECTS 10.    CTA head & neck s/p coiling of left ICA aneurysm, no LVO or  significant stenosis, 11mm right ICA aneurysm MRI  scattered small and punctate infarcts in bialteral cerbral hemispheres with no hemorrhage or mass effect, chronic small vessel disease 2D Echo EF 54-27%, grade 1 diastolic dysfunction, no atrial level shunt LDL 69 HgbA1c 8.4 VTE prophylaxis - SCDs aspirin 81 mg daily and Brilinta (ticagrelor) 90 mg bid prior to admission, now on aspirin Therapy recommendations: CLR disposition:  pending  S/p coiling of left ICA aneurysm Resuma antiplatelet agents when patient is 24 hours out from TNK Continue to monitor right hand for swelling and bruising (improved on exam  today)  Hypertension Home meds:  amlodipine 10 mg daily, carvedilol 25 mg daily, losartan 100 mg daily, spironolactone 25 mg daily Stable Keep BP <180/105 Long-term BP goal normotensive  Hyperlipidemia Home meds:  rosuvastatin 40 mg daily, resumed in hospital LDL 69, goal < 70 Continue statin at discharge  Diabetes type II Uncontrolled Home meds:  Farxiga 5 mg daily, insulin aspart 8 units TIDAC, metformin 1000 mg daily HgbA1c 8.4, goal < 7.0 CBGs SSI  Other Stroke Risk Factors Obesity, Body mass index is 37.28 kg/m., BMI >/= 30 associated with increased stroke risk, recommend weight loss, diet and exercise as appropriate  Obstructive sleep apnea, on CPAP at home   Other Active Problems none  Hospital day # 3  Patient seen and examined by NP/APP with MD. MD to update note as needed.   Janine Ores, DNP, FNP-BC [facility] Pager: [phone] I have personally obtained history,examined this patient, reviewed notes, independently viewed imaging studies, participated in medical decision making and plan of care.ROS completed by me personally and pertinent positives fully documented  I have made any additions or clarifications directly to the above note. Agree with note above.  Continue ongoing therapies and transfer to inpatient rehab when bed available after insurance approval.  Long discussion patient and husband at the bedside and answered questions.  Greater than 50% time during this 35-minute visit was spent on counseling and coordination of care about stroke discussion about hand pain and swelling and answering questions  Antony Contras, MD Medical Director [place] Pager: [phone] 07/23/2021 1:48 PM   To contact Stroke Continuity provider, please refer to [email or web address] After hours, contact General Neurology

## 2021-07-23 NOTE — Progress Notes (Signed)
Courtney Heys, MD  Physician Physical Medicine and Rehabilitation PMR Pre-admission    Signed Date of Service:  07/23/2021  2:45 PM  Related encounter: Admission (Current) from 07/20/2021 in [facility]   Signed      Show:Clear all '[x]' Written'[x]' Templated'[x]' Copied  Added by: '[x]' Cristina Gong, RN'[x]' Courtney Heys, MD  '[]' Hover for details                                                                                                                                                                                                                                                                                                                                                                                                                                  PMR Admission Coordinator Pre-Admission Assessment   Patient: Shannon Oliver is an 61 y.o., female MRN: [id number] DOB: July 21, 1960 Height: '5\' 6"'  (167.6 cm) Weight: 104.8 kg   Insurance Information HMO:     PPO: yes     PCP:      IPA:      80/20:      OTHER:  PRIMARY: [place] BC[employer] [place]      Policy#: [id number]      Subscriber: pt CM Name: Levy Pupa      Phone#: [phone]     Fax#: [phone] Pre-Cert#: [id number]       Employer:  Benefits:  Phone #: [phone]     Name: 1/19 Eff. Date: 07/05/2021  Deduct: $1250      Out of Pocket Max: $4890      Life Max: none CIR: 80%      SNF: 80% Outpatient: $10 to $52 per visit     Co-Pay: visits per medical neccesity Home Health: 80%      Co-Pay: visits per medical neccesity DME: 80%     Co-Pay: 20% Providers: in network  SECONDARY: none       Financial Counselor:       Phone#:    [employer] for patients in Inpatient Rehabilitation Facilities with attached Privacy Act [place]  Records was provided and verbally reviewed with: N/A   Emergency Contact Information Contact Information       Name Relation Home Work [place] Spouse [phone]   [phone]    Duff,Tabitha Sister [phone]   [phone]           Current Medical History  Patient Admitting Diagnosis: aneurysm, CVA   History of Present Illness: 61 year old right-handed female with history of diabetes mellitus, hypertension, hyperlipidemia, obesity with BMI 37.28, OSA, question medical compliance.    Independent prior to admission working as a Training and development officer at [employer].  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  Post procedure left side decreased sensation as well as left-sided weakness.  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  No associated hemorrhage or mass-effect.  CT angiogram head and neck no intracranial large vessel occlusion or significant stenosis.  Echocardiogram with ejection fraction of 65 to 70% no wall motion abnormalities grade 1 diastolic dysfunction.  Patient is currently maintained on low-dose aspirin and no plan for Brilinta.  She did initially need Cleviprex for blood pressure control.     Complete NIHSS TOTAL: 1   Patient's medical record from [facility] has been reviewed by the rehabilitation admission coordinator and physician.   Past Medical History      Past Medical History:  Diagnosis Date   Abnormal EKG      LVH with strain   Acid reflux     Asthma     Depression     Diabetes mellitus      A1C over 9   HLD (hyperlipidemia)     Hypertension     Noncompliance     Obesity     Pneumonia     Sleep apnea        Has the patient had major surgery  during 100 days prior to admission? Yes   Family History   family history includes Breast cancer in her cousin and maternal aunt; Cancer in her mother; Diabetes in her mother; Hypertension in her mother.   Current Medications   Current Facility-Administered Medications:    acetaminophen (TYLENOL) tablet 650 mg, 650 mg, Oral, Q4H PRN, 650 mg at 07/23/21 1411 **OR** acetaminophen (TYLENOL) 160 MG/5ML solution 650 mg, 650 mg, Per Tube, Q4H PRN **OR** acetaminophen (TYLENOL) suppository 650 mg, 650 mg, Rectal, Q4H PRN, de Sindy Messing, [place], MD   amLODipine (NORVASC) tablet 10 mg, 10 mg, Oral, Daily, Shafer, Devon, NP, 10 mg at 07/23/21 1449   aspirin EC tablet 81 mg, 81 mg, Oral, Daily, Garvin Fila, MD, 81 mg at 07/23/21 1024   carvedilol (COREG) tablet 37.5  mg, 37.5 mg, Oral, BID WC, Shafer, Devon, NP, 37.5 mg at 07/23/21 1023   Chlorhexidine Gluconate Cloth 2 % PADS 6 each, 6 each, Topical, Daily, de Sindy Messing, [place], MD, 6 each at 07/22/21 0916   influenza vac split quadrivalent PF (FLUARIX) injection 0.5 mL, 0.5 mL, Intramuscular, Tomorrow-1000, Khaliqdina, Salman, MD   insulin aspart (novoLOG) injection 0-5 Units, 0-5 Units, Subcutaneous, QHS, Shafer, Devon, NP, 2 Units at 07/22/21 2159   insulin aspart (novoLOG) injection 0-9 Units, 0-9 Units, Subcutaneous, TID WC, Shafer, Devon, NP, 3 Units at 07/23/21 1224   ketorolac (TORADOL) 30 MG/ML injection 30 mg, 30 mg, Intravenous, Q6H PRN, Deveshwar, Sanjeev, MD, 30 mg at 07/21/21 2009   losartan (COZAAR) tablet 100 mg, 100 mg, Oral, Daily, Shafer, Devon, NP, 100 mg at 07/23/21 1023   melatonin tablet 5 mg, 5 mg, Oral, QHS PRN, Donnetta Simpers, MD, 5 mg at 07/22/21 2159   multivitamin with minerals tablet 1 tablet, 1 tablet, Oral, Daily, Donnetta Simpers, MD, 1 tablet at 07/23/21 1024   ondansetron (ZOFRAN) injection 4 mg, 4 mg, Intravenous, Q6H PRN, de Sindy Messing, [place], MD   pantoprazole (PROTONIX) EC tablet 40  mg, 40 mg, Oral, QHS, Khaliqdina, Alferd Patee, MD, 40 mg at 07/22/21 2159   pneumococcal 23 valent vaccine (PNEUMOVAX-23) injection 0.5 mL, 0.5 mL, Intramuscular, Tomorrow-1000, Khaliqdina, Salman, MD   rosuvastatin (CRESTOR) tablet 40 mg, 40 mg, Oral, Daily, Donnetta Simpers, MD, 40 mg at 07/23/21 1024   senna-docusate (Senokot-S) tablet 1 tablet, 1 tablet, Oral, QHS PRN, Donnetta Simpers, MD, 1 tablet at 07/23/21 1224   sodium chloride (MURO 128) 2 % ophthalmic solution 1 drop, 1 drop, Left Eye, Daily PRN, Donnetta Simpers, MD   Patients Current Diet:  Diet Order                  Diet Heart Room service appropriate? Yes; Fluid consistency: Thin  Diet effective now                       Precautions / Restrictions Precautions Precautions: Fall Precaution Comments: intermittent LLE buckling Restrictions Weight Bearing Restrictions: No    Has the patient had 2 or more falls or a fall with injury in the past year? No   Prior Activity Level Community (5-7x/wk): Independent, working and driving   Prior Functional Level Self Care: Did the patient need help bathing, dressing, using the toilet or eating? Independent   Indoor Mobility: Did the patient need assistance with walking from room to room (with or without device)? Independent   Stairs: Did the patient need assistance with internal or external stairs (with or without device)? Independent   Functional Cognition: Did the patient need help planning regular tasks such as shopping or remembering to take medications? Independent   Patient Information Are you of Hispanic, Latino/a,or Spanish origin?: A. No, not of Hispanic, Latino/a, or Spanish origin What is your race?: B. Black or African American Do you need or want an interpreter to communicate with a doctor or health care staff?: 0. No   Patient's Response To:  Health Literacy and Transportation Is the patient able to respond to health literacy and transportation needs?:  Yes Health Literacy - How often do you need to have someone help you when you read instructions, pamphlets, or other written material from your doctor or pharmacy?: Never In the past 12 months, has lack of transportation kept you from medical appointments or from getting medications?: No In the past  12 months, has lack of transportation kept you from meetings, work, or from getting things needed for daily living?: No   Development worker, international aid / [place] Devices/Equipment: CPAP, Cane (specify quad or straight), Blood pressure cuff, CBG Meter, Eyeglasses, Scales Home Equipment: Shower seat, Hand held shower head, Grab bars - tub/shower   Prior Device Use: Indicate devices/aids used by the patient prior to current illness, exacerbation or injury? None of the above   Current Functional Level Cognition   Arousal/Alertness: Awake/alert Overall Cognitive Status: Impaired/Different from baseline Orientation Level: Oriented X4 General Comments: aware of deficits, pt self-directed and eager to progress mobility with fair safety awareness. Attention: Focused, Sustained, Selective Focused Attention: Appears intact Sustained Attention: Appears intact Selective Attention: Impaired Selective Attention Impairment: Verbal complex Memory: Impaired Memory Impairment: Retrieval deficit, Decreased short term memory (Immediate: 5/5; delayed: 2/5; with cues: 3/3) Awareness: Appears intact Problem Solving: Appears intact Executive Function: Writer: Impaired Organizing Impairment: Verbal complex (backward digit span: 1/2)    Extremity Assessment (includes Sensation/Coordination)   Upper Extremity Assessment: RUE deficits/detail, LUE deficits/detail RUE Deficits / Details: edema R hand noted but pt able to put weight through it with use of RW. RUE Coordination: decreased fine motor LUE Deficits / Details: Decreased coorindation gross and FM. Able to perform finger opposition  with increased time. Performing finger-to-nose with increased time. LUE Coordination: decreased fine motor, decreased gross motor  Lower Extremity Assessment: Defer to PT evaluation LLE Deficits / Details: hip flex >3/5, knee ext 3+/5 but unable to maintain consistent contraction, experiences sudden failure of contraction on MMT and in standing, seems more apraxic that pure strength loss LLE Sensation: decreased proprioception LLE Coordination: decreased gross motor     ADLs   Overall ADL's : Needs assistance/impaired Eating/Feeding: Set up, Supervision/ safety, Sitting Grooming: Wash/dry face, Set up, Supervision/safety, Sitting Upper Body Bathing: Minimal assistance, Sitting Lower Body Bathing: Minimal assistance, +2 for physical assistance, +2 for safety/equipment, Sit to/from stand Upper Body Dressing : Minimal assistance, Sitting Lower Body Dressing: +2 for physical assistance, +2 for safety/equipment, Moderate assistance Toilet Transfer: Minimal assistance, +2 for physical assistance, +2 for safety/equipment, Ambulation, BSC/3in1 Toileting- Clothing Manipulation and Hygiene: Minimal assistance, +2 for physical assistance, +2 for safety/equipment, Sit to/from stand Toileting - Clothing Manipulation Details (indicate cue type and reason): Min A +2 for balance in standing. Pt able to perform peri care with assistance for balance Functional mobility during ADLs: +2 for physical assistance, +2 for safety/equipment, Rolling walker (2 wheels), Moderate assistance General ADL Comments: Pt presenting with decreased balance, strength, and coorindation.     Mobility   Overal bed mobility: Needs Assistance Bed Mobility: Supine to Sit Supine to sit: Supervision, HOB elevated General bed mobility comments: use of bed features, increased time to perform     Transfers   Overall transfer level: Needs assistance Equipment used: Rolling walker (2 wheels) Transfers: Sit to/from Stand Sit to Stand:  Min guard Bed to/from chair/wheelchair/BSC transfer type:: Stand pivot Stand pivot transfers: Min assist, +2 safety/equipment General transfer comment: light rise and steady assist, STS x4 during session x1 from EOB x3 from chair in rehab gym     Ambulation / Gait / Stairs / Wheelchair Mobility   Ambulation/Gait Ambulation/Gait assistance: Min guard, Mod assist Gait Distance (Feet): 100 Feet (x2 to/from rehab gym and seated break between trials) Assistive device: Rolling walker (2 wheels), 1 person hand held assist Gait Pattern/deviations: Decreased weight shift to left, Decreased stance time - left, Decreased step  length - left, Decreased dorsiflexion - left, Drifts right/left General Gait Details: with HHA on LUE, pt needing up to modA (~77f trial), otherwise min guard assist with RW with very slow gait speed, indicating increased fall risk and tendency to veer toward L side of hallway, of note RW noted to pull slightly to left, when RW switched out pt able to maintain better path in hallway but needing reminder to maintain middle pathway in hall. No buckling with RW but near-buckling multiple times with LUE HHA and increased postural sway. Gait velocity: grossly <0.4 m/s; variable 0.1 to 0.3 m/s depending on AD used Gait velocity interpretation: <1.31 ft/sec, indicative of household ambulator Stairs: Yes Stairs assistance: Min assist Stair Management: One rail Right, One rail Left, Forwards, Step to pattern Number of Stairs: 2 General stair comments: cues for step sequencing with no carryover from previous session; pt hesitant but with proper sequencing, no overt buckling, LLE guarded for safety each step-up and down during stance phase     Posture / Balance Balance Overall balance assessment: Needs assistance Sitting-balance support: No upper extremity supported, Feet supported Sitting balance-Leahy Scale: Good Standing balance support: Bilateral upper extremity supported, During  functional activity Standing balance-Leahy Scale: Poor Standing balance comment: needed UE and external support when only given U UE support; with BUE support, just min guard for safety     Special needs/care consideration      Previous Home Environment  Living Arrangements: Spouse/significant other  Lives With: Spouse Available Help at Discharge: Family, Available 24 hours/day Type of Home: House Home Layout: One level Home Access: Ramped entrance Bathroom Shower/Tub: WMultimedia programmer Handicapped height Home Care Services: No Additional Comments: pt's husband home 24/7. He has LLE prosthesis, does not work, does not drive, but ambulates with cane and appears cognitively intact.   Discharge Living Setting Plans for Discharge Living Setting: Patient's home, Lives with (comment) (spouse) Type of Home at Discharge: House Discharge Home Layout: One level Discharge Home Access: RLaredoentrance Discharge Bathroom Shower/Tub: Walk-in shower Discharge Bathroom Toilet: Handicapped height Discharge Bathroom Accessibility: Yes How Accessible: Accessible via walker Does the patient have any problems obtaining your medications?: No   Social/Family/Support Systems Patient Roles: Spouse (employee) Contact Information: spouse, KChrissie NoaAnticipated Caregiver: spouse Anticipated CAmbulance personInformation: see contacts Ability/Limitations of Caregiver: spouse with prosthesis and uses cane Caregiver Availability: 24/7 Discharge Plan Discussed with Primary Caregiver: Yes Is Caregiver In Agreement with Plan?: Yes Does Caregiver/Family have Issues with Lodging/Transportation while Pt is in Rehab?: No   Goals Patient/Family Goal for Rehab: Mod I with PT, OT and SLP Expected length of stay: ELOS 5 to 7 days Pt/Family Agrees to Admission and willing to participate: Yes Program Orientation Provided & Reviewed with Pt/Caregiver Including Roles  & Responsibilities: Yes   Decrease  burden of Care through IP rehab admission: n/a   Possible need for SNF placement upon discharge: not anticipated   Patient Condition: I [facility] spoken with CM, and patient and spouse. I met with patient at the bedside for inpatient rehabilitation assessment.  Patient will benefit from ongoing PT, OT, and SLP, can actively participate in 3 hours of therapy a day 5 days of the week, and can make measurable gains during the admission.  Patient will also benefit from the coordinated team approach during an Inpatient Acute Rehabilitation admission.  The patient will receive intensive therapy as well as Rehabilitation physician, nursing, social worker, and care management interventions.  Due to bladder management, bowel management, safety, skin/wound care, disease management, medication administration, pain management, and patient education the patient requires 24 hour a day rehabilitation nursing.  The patient is currently min assist overall with mobility and basic ADLs.  Discharge setting and therapy post discharge at home with outpatient is anticipated.  Patient has agreed to participate in the Acute Inpatient Rehabilitation Program and will admit today.   Preadmission Screen Completed By:  Cleatrice Burke, 07/23/2021 2:51 PM ______________________________________________________________________   Discussed status with Dr. Dagoberto Ligas on 07/23/2021 at 1451 and received approval for admission today.   Admission Coordinator:  Cleatrice Burke, RN, time  1712 Date  07/23/2021    Assessment/Plan: Diagnosis: Does the need for close, 24 hr/day Medical supervision in concert with the patient's rehab needs make it unreasonable for this patient to be served in a less intensive setting? Yes Co-Morbidities requiring supervision/potential complications: DM H8N 8.4; HTN, BMI 37; B/L cerebral CVA's L hemiparesis Due to bladder management, bowel management,  safety, skin/wound care, disease management, medication administration, pain management, and patient education, does the patient require 24 hr/day rehab nursing? Yes Does the patient require coordinated care of a physician, rehab nurse, PT, OT, and SLP to address physical and functional deficits in the context of the above medical diagnosis(es)? Yes Addressing deficits in the following areas: balance, endurance, locomotion, strength, transferring, bowel/bladder control, bathing, dressing, feeding, grooming, toileting, and cognition Can the patient actively participate in an intensive therapy program of at least 3 hrs of therapy 5 days a week? Yes The potential for patient to make measurable gains while on inpatient rehab is good Anticipated functional outcomes upon discharge from inpatient rehab: modified independent and supervision PT, modified independent and supervision OT, modified independent and supervision SLP Estimated rehab length of stay to reach the above functional goals is: 5-7 days Anticipated discharge destination: Home 10. Overall Rehab/Functional Prognosis: good     MD Signature:           Revision History                     Note Details  Jan Fireman, MD File Time 07/23/2021  3:08 PM  Author Type Physician Status Signed  Last Editor Courtney Heys, MD Service Physical Medicine and Rehabilitation

## 2021-07-23 NOTE — Progress Notes (Signed)
INPATIENT REHABILITATION ADMISSION NOTE   Arrival Method: wheelchair     Mental Orientation: A&O x4   Assessment: see flowsheet   Skin: intact, bruising BUE   IV'S: none   Pain: none   Tubes and Drains: none   Safety Measures: fall risk   Vital Signs: see flowsheet    Height and Weight:see flowsheet    Rehab Orientation: done with family and patient    Family: Daughter at bedside     Notes:

## 2021-07-23 NOTE — H&P (Signed)
Physical Medicine and Rehabilitation Admission H&P       HPI: Shannon Oliver is a 61 year old right-handed female with history of diabetes mellitus, hypertension, hyperlipidemia, obesity with BMI 37.28, OSA, question medical compliance.  Per chart review patient lives with spouse.  1 level home ramped entrance.  Independent prior to admission working as a Training and development officer at [employer].  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  Post procedure left side decreased sensation as well as left-sided weakness.  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  No associated hemorrhage or mass-effect.  CT angiogram head and neck no intracranial large vessel occlusion or significant stenosis.  Echocardiogram with ejection fraction of 65 to 70% no wall motion abnormalities grade 1 diastolic dysfunction.  Patient is currently maintained on low-dose aspirin and no plan for Brilinta.  She did initially need Cleviprex for blood pressure control.  Therapy evaluations completed due to patient decreased functional mobility left side weakness was admitted for a comprehensive rehab program     Pt reports LBM this AM/voiding well.  Had abd pain prior to BM, but gone now.  Trouble filling out paperwork with her name this AM- problems with dexterity of R hand, trouble thinking as well as vision blurry.  Also having intermittent HA's- are rare for her until they helped her dx aneurysm- scared it means will have another one.    Upset- thought stroke Sx's would completely resolve with "the medicine they gave me".  Upset she's not back to normal, per pt.  Review of Systems  Constitutional:  Negative for chills and fever.  HENT:  Negative for  hearing loss.   Eyes:  Negative for blurred vision and double vision.  Respiratory:  Negative for cough and shortness of breath.   Cardiovascular:  Negative for chest pain, palpitations and leg swelling.  Gastrointestinal:  Positive for constipation. Negative for heartburn, nausea and vomiting.       GERD  Genitourinary:  Negative for dysuria, flank pain and hematuria.  Musculoskeletal:  Positive for joint pain and myalgias.  Skin:  Negative for rash.  Neurological:  Positive for sensory change and weakness.  Psychiatric/Behavioral:  Positive for depression.   All other systems reviewed and are negative.     Past Medical History:  Diagnosis Date   Abnormal EKG      LVH with strain   Acid reflux     Asthma     Depression     Diabetes mellitus      A1C over 9   HLD (hyperlipidemia)     Hypertension     Noncompliance     Obesity     Pneumonia     Sleep apnea           Past Surgical History:  Procedure Laterality Date   CARDIAC CATHETERIZATION       CATARACT EXTRACTION Left 06/04/2021   COLONOSCOPY WITH PROPOFOL N/A 04/29/2015    Procedure: COLONOSCOPY WITH PROPOFOL;  Surgeon: Garlan Fair, MD;  Location: [facility];  Service: Endoscopy;  Laterality: N/A;   EYE SURGERY       HYSTEROSCOPY WITH D & C N/A 09/07/2017    Procedure: DILATATION AND CURETTAGE /HYSTEROSCOPY;  Surgeon: Sloan Leiter, MD;  Location: [facility];  Service: Gynecology;  Laterality: N/A;   IR ANGIO INTRA EXTRACRAN SEL COM CAROTID INNOMINATE UNI R MOD SED   06/18/2021   IR ANGIO INTRA EXTRACRAN SEL INTERNAL CAROTID UNI L MOD SED   06/18/2021   IR ANGIO INTRA EXTRACRAN SEL INTERNAL CAROTID UNI R MOD SED   07/20/2021   IR ANGIO VERTEBRAL SEL VERTEBRAL UNI R MOD SED   06/18/2021   IR ANGIOGRAM FOLLOW UP STUDY   07/20/2021   IR CT HEAD LTD   07/20/2021   IR RADIOLOGIST EVAL & MGMT   06/19/2021   IR TRANSCATH/EMBOLIZ   07/20/2021   IR US GUIDE VASC ACCESS RIGHT   06/18/2021   KNEE ARTHROSCOPY  W/ MENISCAL REPAIR Right     RADIOLOGY WITH ANESTHESIA N/A 07/20/2021    Procedure: IR WITH ANESTHESIA;  Surgeon: Pedro Earls, MD;  Location: [facility];  Service: Radiology;  Laterality: N/A;         Family History  Problem Relation Age of Onset   Cancer Mother     Hypertension Mother     Diabetes Mother     Breast cancer Maternal Aunt     Breast cancer Cousin      Social History:  reports that she has never smoked. She has never used smokeless tobacco. She reports that she does not currently use alcohol. She reports that she does not use drugs. Allergies:       Allergies  Allergen Reactions   Canagliflozin        dizziness, stabbing pain in right side of body, tolerates low dose   Hydrocodone Hypertension          Medications Prior to Admission  Medication Sig Dispense Refill   acetaminophen (TYLENOL) 500 MG tablet Take 1,000 mg by mouth every 4 (four) hours as needed for moderate pain.       amLODipine (NORVASC) 10 MG tablet TAKE 1 TABLET EVERY DAY ONCE A DAY ORALLY 90 30 tablet 1   aspirin EC 81 MG tablet Take 1 tablet (81 mg total) by mouth daily. Swallow whole. Start on 07/13/2020. 30 tablet 11   carvedilol (COREG) 25 MG tablet Take 37.5 mg by mouth 2 (two) times daily with a meal.       FARXIGA 5 MG TABS tablet TAKE 1 TABLET (5 MG TOTAL) BY MOUTH DAILY. 30 tablet 1   ibuprofen (ADVIL) 200 MG tablet Take 400 mg by mouth every 4 (four) hours as needed for moderate pain.       insulin aspart (FIASP) 100 UNIT/ML FlexTouch Pen Inject 8 Units into the skin 3 (three) times daily before meals. 15 mL 1   losartan (COZAAR) 100 MG tablet Take 100 mg by mouth daily.       MELATONIN PO Take 1 capsule by mouth at bedtime as needed (sleep).       metFORMIN (GLUCOPHAGE) 1000 MG tablet Take 1,000 mg by mouth daily.       Multiple Vitamins-Calcium (ONE-A-DAY WOMENS PO) Take 1 tablet by mouth daily.       prednisoLONE acetate (PRED FORTE) 1 % ophthalmic suspension Place 1 drop  into the left eye daily.       promethazine (PHENERGAN) 12.5 MG tablet Take 12.5 mg by mouth every 6 (six) hours as needed for nausea or vomiting.       rosuvastatin (CRESTOR) 40 MG tablet Take 40 mg by mouth daily.       Sodium Chloride, Hypertonic, (MURO 128 OP) Place 1  application into the left eye at bedtime.       SODIUM CHLORIDE, HYPERTONIC, OP Place 1 drop into the left eye daily as needed (dry eyes/irritation).       spironolactone (ALDACTONE) 25 MG tablet Take 1 tablet by mouth daily.       ticagrelor (BRILINTA) 90 MG TABS tablet Take 1 tablet (90 mg total) by mouth 2 (two) times daily. Start on 07/13/2021 60 tablet 5   Accu-Chek Softclix Lancets lancets Use to check blood sugar twice daily. 100 each 3   benzonatate (TESSALON) 100 MG capsule Take 1 capsule (100 mg total) by mouth every 8 (eight) hours. (Patient not taking: Reported on 07/16/2021) 21 capsule 0   Continuous Blood Gluc Sensor (DEXCOM G6 SENSOR) MISC Use to monitor blood sugar, change after 10 days 3 each 3   glucose blood (ACCU-CHEK GUIDE) test strip Use as instructed to check blood sugar twice daily. 100 each 12   insulin glargine, 1 Unit Dial, (TOUJEO SOLOSTAR) 300 UNIT/ML Solostar Pen Adjust as directed 3 mL 1   Insulin Syringe-Needle U-100 30G X 1/2" 0.3 ML MISC Use twice a day with insulin 100 each 0   oxyCODONE-acetaminophen (PERCOCET/ROXICET) 5-325 MG tablet Take 1 tablet by mouth every 6 (six) hours as needed for up to 5 doses for severe pain. (Patient not taking: Reported on 07/09/2021) 5 tablet 0   potassium chloride SA (KLOR-CON M20) 20 MEQ tablet Take 1 tablet (20 mEq total) by mouth daily. (Patient not taking: Reported on 07/09/2021) 30 tablet 1      Drug Regimen Review Drug regimen was reviewed and remains appropriate with no significant issues identified   Home: Home Living Family/patient expects to be discharged to:: Private residence Living Arrangements: Spouse/significant other Available Help at Discharge:  Family, Available 24 hours/day Type of Home: House Home Access: Ramped entrance Home Layout: One level Bathroom Shower/Tub: Multimedia programmer: Handicapped height [place]: [place], Hand held shower head, Grab bars - tub/shower Additional Comments: pt's husband home 24/7. He has LLE prosthesis, does not work, does not drive, but ambulates with cane and appears cognitively intact.  Lives With: Spouse   Functional History: Prior Function Prior Level of Function : Independent/Modified Independent, Working/employed, Driving Mobility Comments: independent ADLs Comments: [employer] at [employer] Status:  Mobility: Bed Mobility Overal bed mobility: Needs Assistance Bed Mobility: Supine to Sit Supine to sit: Min assist, HOB elevated General bed mobility comments: sitting EOB upon PT arrival Transfers Overall transfer level: Needs assistance Equipment used: Rolling walker (2 wheels) Transfers: Sit to/from Stand Sit to Stand: Min assist Bed to/from chair/wheelchair/BSC transfer type:: Stand pivot Stand pivot transfers: Min assist, +2 safety/equipment General transfer comment: light rise and steady assist, STS x4 during session x1 from EOB x3 from chair in rehab gym Ambulation/Gait Ambulation/Gait assistance: Min guard Gait Distance (Feet): 100 Feet (x2 - to and from rehab gym with rest breaks) Assistive device: Rolling walker (2 wheels) Gait Pattern/deviations: Decreased weight shift to left, Decreased stance time - left, Decreased step length - left, Knees buckling, Decreased dorsiflexion - left General Gait Details: close guard for safety, pt with x2 observed LLE buckles which happen when pt lessens UE support on RW (pt corrected). Verbal cuing for upright posture, placement in RW, increasing L foot clearance via DF Gait velocity: decr Gait velocity interpretation: <1.31 ft/sec, indicative of household ambulator   ADL: ADL Overall ADL's : Needs  assistance/impaired Eating/Feeding: Set up, Supervision/ safety, Sitting Grooming:  Wash/dry face, Set up, Supervision/safety, Sitting Upper Body Bathing: Minimal assistance, Sitting Lower Body Bathing: Minimal assistance, +2 for physical assistance, +2 for safety/equipment, Sit to/from stand Upper Body Dressing : Minimal assistance, Sitting Lower Body Dressing: +2 for physical assistance, +2 for safety/equipment, Moderate assistance Toilet Transfer: Minimal assistance, +2 for physical assistance, +2 for safety/equipment, Ambulation, BSC/3in1 Toileting- Clothing Manipulation and Hygiene: Minimal assistance, +2 for physical assistance, +2 for safety/equipment, Sit to/from stand Toileting - Clothing Manipulation Details (indicate cue type and reason): Min A +2 for balance in standing. Pt able to perform peri care with assistance for balance Functional mobility during ADLs: +2 for physical assistance, +2 for safety/equipment, Rolling walker (2 wheels), Moderate assistance General ADL Comments: Pt presenting with decreased balance, strength, and coorindation.   Cognition: Cognition Overall Cognitive Status: Impaired/Different from baseline Arousal/Alertness: Awake/alert Orientation Level: Oriented X4 Year: 2023 Month: January Day of Week: Correct Attention: Focused, Sustained, Selective Focused Attention: Appears intact Sustained Attention: Appears intact Selective Attention: Impaired Selective Attention Impairment: Verbal complex Memory: Impaired Memory Impairment: Retrieval deficit, Decreased short term memory (Immediate: 5/5; delayed: 2/5; with cues: 3/3) Awareness: Appears intact Problem Solving: Appears intact Executive Function: Writer: Impaired Organizing Impairment: Verbal complex (backward digit span: 1/2) Cognition Arousal/Alertness: Awake/alert Behavior During Therapy: WFL for tasks assessed/performed Overall Cognitive Status: Impaired/Different from  baseline General Comments: aware of deficits, difficulty understanding how her procedure led to current deficits, asks for clarification multiple times   Physical Exam: Blood pressure 135/63, pulse 65, temperature 98.6 F (37 C), temperature source Oral, resp. rate 17, height 5\' 6"  (1.676 m), weight 104.8 kg, last menstrual period 08/02/2013, SpO2 99 %. Physical Exam Vitals and nursing note reviewed.  Constitutional:      Appearance: She is obese.     Comments: A little fuzzy acting; sitting up at bedside;EOB; BMI 37; frustrated, NAD  HENT:     Head: Normocephalic and atraumatic.     Comments: Smile equal; tongue midline    Right Ear: External ear normal.     Left Ear: External ear normal.     Nose: Nose normal. No congestion.     Mouth/Throat:     Mouth: Mucous membranes are dry.     Pharynx: Oropharynx is clear. No oropharyngeal exudate.  Eyes:     General:        Right eye: No discharge.        Left eye: No discharge.     Extraocular Movements: Extraocular movements intact.     Comments: No nystagmus seen  Cardiovascular:     Rate and Rhythm: Normal rate and regular rhythm.     Heart sounds: Normal heart sounds. No murmur heard.   No gallop.  Abdominal:     General: Bowel sounds are normal. There is no distension.     Palpations: Abdomen is soft.     Tenderness: There is no abdominal tenderness. There is no rebound.  Musculoskeletal:     Cervical back: Neck supple. No tenderness.     Comments: Poor effort RUE/RLE 5/5 LUE- 4+/5 in biceps, triceps, WE, grip; FA 4/5 LLE- HF/KE/KF/DF and PF 4/5  Skin:    General: Skin is warm and dry.     Comments: Moderate to severe R hand , wrist and moderate forearm swelling due to radial artery catheterization Fingers also swollen- can barely make fist due to swelling  Neurological:     Mental Status: She is alert.     Comments: Patient is alert.  Makes eye  contact with examiner however she did display some decrease in attention.   Oriented x3 and follows commands.  Fair insight and awareness. C/O blurry vision- but no visual field deficits appreciated Slowed/delayed responses Intact to light touch in all 4 extremites  Psychiatric:     Comments: Frustrated and didn't appear to grasp that stroke symptoms don't completely go away with intervention      Lab Results Last 48 Hours        Results for orders placed or performed during the hospital encounter of 07/20/21 (from the past 48 hour(s))  Glucose, capillary     Status: Abnormal    Collection Time: 07/22/21 11:44 AM  Result Value Ref Range    Glucose-Capillary 206 (H) 70 - 99 mg/dL      Comment: Glucose reference range applies only to samples taken after fasting for at least 8 hours.  Glucose, capillary     Status: Abnormal    Collection Time: 07/22/21  4:54 PM  Result Value Ref Range    Glucose-Capillary 297 (H) 70 - 99 mg/dL      Comment: Glucose reference range applies only to samples taken after fasting for at least 8 hours.  Glucose, capillary     Status: Abnormal    Collection Time: 07/22/21  8:58 PM  Result Value Ref Range    Glucose-Capillary 248 (H) 70 - 99 mg/dL      Comment: Glucose reference range applies only to samples taken after fasting for at least 8 hours.  Glucose, capillary     Status: Abnormal    Collection Time: 07/23/21  6:28 AM  Result Value Ref Range    Glucose-Capillary 220 (H) 70 - 99 mg/dL      Comment: Glucose reference range applies only to samples taken after fasting for at least 8 hours.       Imaging Results (Last 48 hours)  CT HEAD WO CONTRAST (5MM)   Result Date: 07/21/2021 CLINICAL DATA:  Stroke.  TNK follow-up EXAM: CT HEAD WITHOUT CONTRAST TECHNIQUE: Contiguous axial images were obtained from the base of the skull through the vertex without intravenous contrast. RADIATION DOSE REDUCTION: This exam was performed according to the departmental dose-optimization program which includes automated exposure control, adjustment  of the mA and/or kV according to patient size and/or use of iterative reconstruction technique. COMPARISON:  MRI head 07/21/2021.  CT head 07/20/2021 FINDINGS: Brain: Negative for acute hemorrhage. Ventricle size normal. Patchy white matter hypodensity bilaterally Small areas of acute infarct identified on diffusion-weighted imaging are not well delineated on CT. Vascular: Negative for hyperdense vessel. Prior coiling of the terminal left ICA aneurysm. Skull: Negative Sinuses/Orbits: Mild mucosal edema paranasal sinuses. No orbital lesion. Left cataract extraction. Other: None IMPRESSION: 1. No acute intracranial hemorrhage 2. Diffuse white matter hypodensity compatible with ischemia. Small areas of restricted diffusion best seen on MRI today. Electronically Signed   By: Franchot Gallo M.D.   On: 07/21/2021 13:40             Medical Problem List and Plan: 1. 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  Antithrombotics: -DVT/anticoagulation:  Mechanical:  Antiembolism stockings, knee (TED hose) Bilateral lower extremities             -  antiplatelet therapy: Aspirin 81 mg daily 3. Pain Management: Tylenol as needed 4. Mood: Melatonin as needed             -antipsychotic agents: N/A 5. Neuropsych: This patient is capable of making decisions on her own behalf. 6. Skin/Wound Care: Routine skin checks 7. Fluids/Electrolytes/Nutrition: Routine in and outs with follow-up chemistries 8.  Hypertension.   Norvasc 10 mg daily,Cozaar 100 mg daily, Coreg 37.5 mg twice daily.  Monitor with increased mobility 9.  Diabetes mellitus.  Hemoglobin A1c 8.4.  SSI.  Patient on Glucophage 1000 mg daily, Toujeo prior to admission.  Resume as needed 10.  Hyperlipidemia.  Crestor 11.   Obesity.  BMI 37.28.  Dietary follow-up 12.  Question medical compliance.  Provide counseling 13.  GERD.  Protonix   Lavon Paganini Angiulli, PA-C 07/23/2021

## 2021-07-23 NOTE — Progress Notes (Signed)
Inpatient Rehabilitation Admissions Coordinator   I await insurance approval for possible CIR admit today.  Danne Baxter, RN, MSN Rehab Admissions Coordinator [phone] 07/23/2021 11:03 AM

## 2021-07-23 NOTE — Discharge Summary (Addendum)
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   Accu-Chek Softclix Lancets lancets Use to check blood sugar twice daily.   acetaminophen 500 MG tablet Commonly known as: TYLENOL Take 1,000 mg by mouth every 4 (four) hours as needed for moderate pain.   amLODipine 10 MG tablet Commonly known as: NORVASC TAKE 1 TABLET EVERY DAY ONCE A DAY ORALLY 90   aspirin 81 MG EC tablet Take 1 tablet (81 mg total) by mouth daily. Swallow whole. Start taking on: July 24, 2021 What changed: additional instructions   benzonatate 100 MG capsule Commonly known as: TESSALON Take 1 capsule (100 mg total) by mouth every 8 (eight) hours.   carvedilol 25 MG tablet Commonly known as: COREG Take 37.5 mg by mouth 2 (two) times daily with a meal.   Dexcom G6 Sensor Misc Use to monitor blood sugar, change after 10 days   Farxiga 5 MG Tabs  tablet Generic drug: dapagliflozin propanediol TAKE 1 TABLET (5 MG TOTAL) BY MOUTH DAILY.   ibuprofen 200 MG tablet Commonly known as: ADVIL Take 400 mg by mouth every 4 (four) hours as needed for moderate pain.   insulin aspart 100 UNIT/ML FlexTouch Pen Commonly known as: FIASP Inject 8 Units into the skin 3 (three) times daily before meals.   Insulin Syringe-Needle U-100 30G X 1/2" 0.3 ML Misc Use twice a day with insulin   losartan 100 MG tablet Commonly known as: COZAAR Take 100 mg by mouth daily.   MELATONIN PO Take 1 capsule by mouth at bedtime as needed (sleep).   metFORMIN 1000 MG tablet Commonly known as: GLUCOPHAGE Take 1,000 mg by mouth daily.   ONE-A-DAY WOMENS PO Take 1 tablet by mouth daily.   oxyCODONE-acetaminophen 5-325 MG tablet Commonly known as: PERCOCET/ROXICET Take 1 tablet by mouth every 6 (six) hours as needed for up to 5 doses for severe pain.   potassium chloride SA 20 MEQ tablet Commonly known as: Klor-Con M20 Take 1 tablet (20 mEq total) by mouth daily.   prednisoLONE acetate 1 % ophthalmic suspension Commonly known as: PRED FORTE Place 1 drop into the left eye daily.   promethazine 12.5 MG tablet Commonly known as: PHENERGAN Take 12.5 mg by mouth every 6 (six) hours as needed for nausea or vomiting.   rosuvastatin 40 MG tablet Commonly known as: CRESTOR Take  40 mg by mouth daily.   SODIUM CHLORIDE (HYPERTONIC) OP Place 1 drop into the left eye daily as needed (dry eyes/irritation).   MURO 263 OP Place 1 application into the left eye at bedtime.   spironolactone 25 MG tablet Commonly known as: ALDACTONE Take 1 tablet by mouth daily.   Toujeo SoloStar 300 UNIT/ML Solostar Pen Generic drug: insulin glargine (1 Unit Dial) Adjust as directed        LABORATORY STUDIES CBC    Component Value Date/Time   WBC 6.0 07/20/2021 0642   RBC 4.03 07/20/2021 0642   HGB 11.9 (L) 07/20/2021 0642   HGB 12.0 05/30/2017 1858   HCT 36.5  07/20/2021 0642   HCT 36.3 05/30/2017 1858   PLT 252 07/20/2021 0642   PLT 248 05/30/2017 1858   MCV 90.6 07/20/2021 0642   MCV 90 05/30/2017 1858   MCH 29.5 07/20/2021 0642   MCHC 32.6 07/20/2021 0642   RDW 14.3 07/20/2021 0642   RDW 13.7 05/30/2017 1858   LYMPHSABS 1.8 07/20/2021 0642   LYMPHSABS 3.2 (H) 05/30/2017 1858   MONOABS 0.6 07/20/2021 0642   EOSABS 0.2 07/20/2021 0642   EOSABS 0.2 05/30/2017 1858   BASOSABS 0.1 07/20/2021 0642   BASOSABS 0.0 05/30/2017 1858   CMP    Component Value Date/Time   NA 139 07/20/2021 0642   K 4.1 07/20/2021 0642   CL 103 07/20/2021 0642   CO2 25 07/20/2021 0642   GLUCOSE 210 (H) 07/20/2021 0642   BUN 20 07/20/2021 0642   CREATININE 1.00 07/20/2021 0642   CREATININE 1.00 10/16/2013 1759   CALCIUM 9.7 07/20/2021 0642   PROT 7.6 07/07/2021 1346   ALBUMIN 4.0 07/07/2021 1346   AST 19 07/07/2021 1346   ALT 23 07/07/2021 1346   ALKPHOS 39 07/07/2021 1346   BILITOT 0.4 07/07/2021 1346   GFRNONAA >60 07/20/2021 0642   GFRAA >60 08/09/2018 1017   COAGS Lab Results  Component Value Date   INR 1.1 07/20/2021   INR 0.9 07/20/2021   INR 1.1 06/18/2021   Lipid Panel    Component Value Date/Time   CHOL 129 07/21/2021 0111   TRIG 53 07/21/2021 0111   HDL 49 07/21/2021 0111   CHOLHDL 2.6 07/21/2021 0111   VLDL 11 07/21/2021 0111   LDLCALC 69 07/21/2021 0111   HgbA1C  Lab Results  Component Value Date   HGBA1C 8.4 (H) 07/21/2021   Urinalysis    Component Value Date/Time   COLORURINE YELLOW 08/09/2018 1017   APPEARANCEUR CLEAR 08/09/2018 1017   LABSPEC 1.020 08/09/2018 1017   PHURINE 6.0 08/09/2018 1017   GLUCOSEU NEGATIVE 08/09/2018 1017   HGBUR NEGATIVE 08/09/2018 1017   BILIRUBINUR NEGATIVE 08/09/2018 1017   KETONESUR NEGATIVE 08/09/2018 1017   PROTEINUR NEGATIVE 08/09/2018 1017   UROBILINOGEN 0.2 08/02/2013 1024   NITRITE NEGATIVE 08/09/2018 1017   LEUKOCYTESUR NEGATIVE 08/09/2018 1017   Urine Drug Screen No results  found for: LABOPIA, COCAINSCRNUR, LABBENZ, AMPHETMU, THCU, LABBARB  Alcohol Level No results found for: ETH   SIGNIFICANT DIAGNOSTIC STUDIES CT HEAD WO CONTRAST (5MM)  Result Date: 07/21/2021 CLINICAL DATA:  Stroke.  TNK follow-up EXAM: CT HEAD WITHOUT CONTRAST TECHNIQUE: Contiguous axial images were obtained from the base of the skull through the vertex without intravenous contrast. RADIATION DOSE REDUCTION: This exam was performed according to the departmental dose-optimization program which includes automated exposure control, adjustment of the mA and/or kV according to patient size and/or use of iterative reconstruction technique. COMPARISON:  MRI  head 07/21/2021.  CT head 07/20/2021 FINDINGS: Brain: Negative for acute hemorrhage. Ventricle size normal. Patchy white matter hypodensity bilaterally Small areas of acute infarct identified on diffusion-weighted imaging are not well delineated on CT. Vascular: Negative for hyperdense vessel. Prior coiling of the terminal left ICA aneurysm. Skull: Negative Sinuses/Orbits: Mild mucosal edema paranasal sinuses. No orbital lesion. Left cataract extraction. Other: None IMPRESSION: 1. No acute intracranial hemorrhage 2. Diffuse white matter hypodensity compatible with ischemia. Small areas of restricted diffusion best seen on MRI today. Electronically Signed   By: Franchot Gallo M.D.   On: 07/21/2021 13:40   MR BRAIN WO CONTRAST  Result Date: 07/21/2021 CLINICAL DATA:  61 year old female status post endovascular embolization of left ICA terminus aneurysm with web device. Neurologic deficit. EXAM: MRI HEAD WITHOUT CONTRAST TECHNIQUE: Multiplanar, multiecho pulse sequences of the brain and surrounding structures were obtained without intravenous contrast. COMPARISON:  CT head without contrast and CTA head and neck 07/20/2021. FINDINGS: Brain: There are scattered small - generally punctate - foci of diffusion restriction in both cerebral hemispheres. Most are in  the left hemisphere involving the left MCA and left PCA territory. Among the most conspicuous is a 6 mm focus at the left external capsule abutting the lentiform (series 5, image 87 and series 7, image 70). Small involvement also at the right genu of the corpus callosum (right ACA territory). Occasional right MCA involvement. Subtle T2 and FLAIR hyperintensity associated with the acute findings. With moderate superimposed widely scattered background white matter T2 and FLAIR hyperintensity. And mild to moderate associated heterogeneity in the bilateral basal ganglia. But no cortical encephalomalacia identified. Trace if any chronic cerebral blood products. Mild susceptibility artifact at the left ICA terminus related to endovascular embolization. No restricted diffusion to suggest acute infarction. No midline shift, mass effect, evidence of mass lesion, ventriculomegaly, extra-axial collection or acute intracranial hemorrhage. Vascular: Major intracranial vascular flow voids are preserved. Skull and upper cervical spine: Negative visible cervical spine. Visualized bone marrow signal is within normal limits. Sinuses/Orbits: Postoperative changes to the left globe. Otherwise negative orbits. Paranasal Visualized paranasal sinuses and mastoids are stable and well aerated. Other: Mastoids are well aerated.  Negative visible scalp and face. IMPRESSION: 1. 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 Among the most conspicuous is a 6 mm lacune in the left external capsule abutting the lentiform. No associated hemorrhage or mass effect. 2. Underlying moderate for age signal changes in the brain compatible with chronic small vessel disease. Electronically Signed   By: Genevie Ann M.D.   On: 07/21/2021 05:30   IR Transcath/Emboliz  Result Date: 07/20/2021 INDICATION: Shannon Oliver is a 61 year old female with past medical history significant for diabetes, hyperlipidemia,  hypertension and cataract. She presented to the emergency room with headache on 06/05/2021. 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 She underwent a diagnostic cerebral angiogram on 06/19/2021 that confirmed the presence of the 2 brain aneurysms seen on prior CTA. She comes to our service today for elective treatment of her left ICA aneurysm. In preparation to today's procedure, she was started on Brilinta 90 mg b.i.d. and aspirin 81 mg q.d. EXAM: ULTRASOUND-GUIDED VASCULAR ACCESS DIAGNOSTIC CEREBRAL ANGIOGRAM ENDOVASCULAR ANEURYSM EMBOLIZATION FLAT PANEL HEAD CT COMPARISON:  Cerebral angiogram June 18, 2021. MEDICATIONS: Ancef 2 g IV. The antibiotic was administered within 1 hour of the procedure. ANESTHESIA/SEDATION: The study  was performed in the general anesthesia. CONTRAST:  106 cc of Omnipaque 300 milligram/mL FLUOROSCOPY TIME:  Fluoroscopy Time: 42 minutes 48 seconds (1,843 mGy). COMPLICATIONS: Delayed - approximately one hour after procedure, patient developed left sided weakness, unclear whether procedure related given intervention in the left ICA. TECHNIQUE: Informed written consent was obtained from the patient after a thorough discussion of the procedural risks, benefits and alternatives. All questions were addressed. Maximal Sterile Barrier Technique was utilized including caps, mask, sterile gowns, sterile gloves, sterile drape, hand hygiene and skin antiseptic. A timeout was performed prior to the initiation of the procedure. Using the modified Seldinger technique and a micropuncture kit, access was gained to the distal right radial artery at the anatomical snuffbox and a 7 French sheath was placed. Real-time ultrasound guidance was utilized for vascular access including the acquisition of a permanent ultrasound image documenting patency of the accessed vessel. Slow intra arterial infusion of 5,000 IU heparin, 5 mg  Verapamil and 200 mcg nitroglycerin diluted in patient's own blood was performed. No significant fluctuation in patient's blood pressure seen. Then, a right radial artery angiogram was obtained via sheath side port. Next, a 5 [place] Simmons 2 glide catheter was navigated over a 0.035" Terumo Glidewire into the right subclavian artery under fluoroscopic guidance. The catheter was then advanced into the left common carotid artery. Frontal and lateral angiograms of the neck were obtained. Using biplane roadmap guidance, the catheter was then advanced into the left internal carotid artery. Frontal, lateral, magnified waters and magnified lateral views of the head were obtained. FINDINGS: 1. Normal brachial artery branching pattern seen. No significant anatomical variation. The right radial artery caliber is adequate for vascular access. 2. 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 3. There is brisk vascular contrast filling of the bilateral ACA and left MCA vascular trees. The visualized dural sinuses are patent. PROCEDURE: The Simmons 2 glide catheter was exchanged over the wire and under biplane roadmap for a 7 [place] Rist catheter which was placed in the distal cervical segment of the left ICA. Magnified frontal and lateral angiograms of the head were obtained in the working projections. Then, a [place] EX intermediate catheter was navigated through the risk catheter and over a via 17 microcatheter and a synchro 2 micro guidewire into the cavernous segment of the right ICA. Magnified frontal and lateral angiograms of the head were obtained in the working projections. The via microcatheter was then navigated over the wire into the left ICA terminus aneurysm pouch. The wire was removed. Then, a 4 x 2 mm web SL device was deployed into the aneurysm pouch. Frontal and lateral magnified angiograms of the head were obtained in the working projections. Adequate positioning of  the device was noted. The device was then detached under fluoroscopy. Follow-up left internal carotid artery angiograms with magnified frontal and lateral views of the head in the working projections showed stable position of the device within the aneurysm pouch. Left ICA angiograms with frontal and lateral views of the entire head were then obtained, showed no evidence of thromboembolic complication. Flat panel CT of the head was obtained and post processed in a separate workstation with concurrent attending physician supervision. Selected images were sent to PACS. No evidence of hemorrhagic complication noted. The catheter was subsequently withdrawn. An inflatable band was placed and inflated over the right hand access site. The vascular sheath was withdrawn and the band was slowly deflated until brisk flow  was noted through the arteriotomy site. At this point, the band was reinflated with additional 2 cc of air to obtain patent hemostasis. 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 No evidence of hemorrhagic of thromboembolic complication. PLAN: Patient will be admitted to ICU for observation. Electronically Signed   By: Pedro Earls M.D.   On: 07/20/2021 15:53   IR Angiogram Follow Up Study  Result Date: 07/20/2021 INDICATION: Shannon Oliver is a 61 year old female with past medical history significant for diabetes, hyperlipidemia, hypertension and cataract. She presented to the emergency room with headache on 06/05/2021. 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 She underwent a diagnostic cerebral angiogram on 06/19/2021 that confirmed the presence of the 2 brain aneurysms seen on prior CTA. She comes to our service today for elective treatment of her left ICA aneurysm. In preparation to today's procedure, she was started on Brilinta 90 mg b.i.d. and  aspirin 81 mg q.d. EXAM: ULTRASOUND-GUIDED VASCULAR ACCESS DIAGNOSTIC CEREBRAL ANGIOGRAM ENDOVASCULAR ANEURYSM EMBOLIZATION FLAT PANEL HEAD CT COMPARISON:  Cerebral angiogram June 18, 2021. MEDICATIONS: Ancef 2 g IV. The antibiotic was administered within 1 hour of the procedure. ANESTHESIA/SEDATION: The study was performed in the general anesthesia. CONTRAST:  106 cc of Omnipaque 300 milligram/mL FLUOROSCOPY TIME:  Fluoroscopy Time: 42 minutes 48 seconds (1,843 mGy). COMPLICATIONS: Delayed - approximately one hour after procedure, patient developed left sided weakness, unclear whether procedure related given intervention in the left ICA. TECHNIQUE: Informed written consent was obtained from the patient after a thorough discussion of the procedural risks, benefits and alternatives. All questions were addressed. Maximal Sterile Barrier Technique was utilized including caps, mask, sterile gowns, sterile gloves, sterile drape, hand hygiene and skin antiseptic. A timeout was performed prior to the initiation of the procedure. Using the modified Seldinger technique and a micropuncture kit, access was gained to the distal right radial artery at the anatomical snuffbox and a 7 French sheath was placed. Real-time ultrasound guidance was utilized for vascular access including the acquisition of a permanent ultrasound image documenting patency of the accessed vessel. Slow intra arterial infusion of 5,000 IU heparin, 5 mg Verapamil and 200 mcg nitroglycerin diluted in patient's own blood was performed. No significant fluctuation in patient's blood pressure seen. Then, a right radial artery angiogram was obtained via sheath side port. Next, a 5 [place] Simmons 2 glide catheter was navigated over a 0.035" Terumo Glidewire into the right subclavian artery under fluoroscopic guidance. The catheter was then advanced into the left common carotid artery. Frontal and lateral angiograms of the neck were obtained. Using biplane  roadmap guidance, the catheter was then advanced into the left internal carotid artery. Frontal, lateral, magnified waters and magnified lateral views of the head were obtained. FINDINGS: 1. Normal brachial artery branching pattern seen. No significant anatomical variation. The right radial artery caliber is adequate for vascular access. 2. 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 3. There is brisk vascular contrast filling of the bilateral ACA and left MCA vascular trees. The visualized dural sinuses are patent. PROCEDURE: The Simmons 2 glide catheter was exchanged over the wire and under biplane roadmap for a 7 [place] Rist catheter which was placed in the distal cervical segment of the left ICA. Magnified frontal and lateral angiograms of the head were obtained in the  working projections. Then, a [place] EX intermediate catheter was navigated through the risk catheter and over a via 17 microcatheter and a synchro 2 micro guidewire into the cavernous segment of the right ICA. Magnified frontal and lateral angiograms of the head were obtained in the working projections. The via microcatheter was then navigated over the wire into the left ICA terminus aneurysm pouch. The wire was removed. Then, a 4 x 2 mm web SL device was deployed into the aneurysm pouch. Frontal and lateral magnified angiograms of the head were obtained in the working projections. Adequate positioning of the device was noted. The device was then detached under fluoroscopy. Follow-up left internal carotid artery angiograms with magnified frontal and lateral views of the head in the working projections showed stable position of the device within the aneurysm pouch. Left ICA angiograms with frontal and lateral views of the entire head were then obtained, showed no evidence of thromboembolic complication. Flat panel CT of the head was obtained and post processed in a separate workstation with concurrent  attending physician supervision. Selected images were sent to PACS. No evidence of hemorrhagic complication noted. The catheter was subsequently withdrawn. An inflatable band was placed and inflated over the right hand access site. The vascular sheath was withdrawn and the band was slowly deflated until brisk flow was noted through the arteriotomy site. At this point, the band was reinflated with additional 2 cc of air to obtain patent hemostasis. 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 No evidence of hemorrhagic of thromboembolic complication. PLAN: Patient will be admitted to ICU for observation. Electronically Signed   By: Pedro Earls M.D.   On: 07/20/2021 15:53   IR CT Head Ltd  Result Date: 07/20/2021 INDICATION: Shannon Oliver is a 61 year old female with past medical history significant for diabetes, hyperlipidemia, hypertension and cataract. She presented to the emergency room with headache on 06/05/2021. 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 She underwent a diagnostic cerebral angiogram on 06/19/2021 that confirmed the presence of the 2 brain aneurysms seen on prior CTA. She comes to our service today for elective treatment of her left ICA aneurysm. In preparation to today's procedure, she was started on Brilinta 90 mg b.i.d. and aspirin 81 mg q.d. EXAM: ULTRASOUND-GUIDED VASCULAR ACCESS DIAGNOSTIC CEREBRAL ANGIOGRAM ENDOVASCULAR ANEURYSM EMBOLIZATION FLAT PANEL HEAD CT COMPARISON:  Cerebral angiogram June 18, 2021. MEDICATIONS: Ancef 2 g IV. The antibiotic was administered within 1 hour of the procedure. ANESTHESIA/SEDATION: The study was performed in the general anesthesia. CONTRAST:  106 cc of Omnipaque 300 milligram/mL FLUOROSCOPY TIME:  Fluoroscopy Time: 42 minutes 48 seconds (1,843 mGy). COMPLICATIONS: Delayed - approximately one  hour after procedure, patient developed left sided weakness, unclear whether procedure related given intervention in the left ICA. TECHNIQUE: Informed written consent was obtained from the patient after a thorough discussion of the procedural risks, benefits and alternatives. All questions were addressed. Maximal Sterile Barrier Technique was utilized including caps, mask, sterile gowns, sterile gloves, sterile drape, hand hygiene and skin antiseptic. A timeout was performed prior to the initiation of the procedure. Using the modified Seldinger technique and a micropuncture kit, access was gained to the distal right radial artery at the anatomical snuffbox and a 7 French sheath was placed. Real-time ultrasound guidance was utilized for vascular access including the acquisition of a permanent ultrasound image documenting  patency of the accessed vessel. Slow intra arterial infusion of 5,000 IU heparin, 5 mg Verapamil and 200 mcg nitroglycerin diluted in patient's own blood was performed. No significant fluctuation in patient's blood pressure seen. Then, a right radial artery angiogram was obtained via sheath side port. Next, a 5 [place] Simmons 2 glide catheter was navigated over a 0.035" Terumo Glidewire into the right subclavian artery under fluoroscopic guidance. The catheter was then advanced into the left common carotid artery. Frontal and lateral angiograms of the neck were obtained. Using biplane roadmap guidance, the catheter was then advanced into the left internal carotid artery. Frontal, lateral, magnified waters and magnified lateral views of the head were obtained. FINDINGS: 1. Normal brachial artery branching pattern seen. No significant anatomical variation. The right radial artery caliber is adequate for vascular access. 2. 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 3. There is brisk vascular contrast filling of the bilateral ACA and left MCA vascular  trees. The visualized dural sinuses are patent. PROCEDURE: The Simmons 2 glide catheter was exchanged over the wire and under biplane roadmap for a 7 [place] Rist catheter which was placed in the distal cervical segment of the left ICA. Magnified frontal and lateral angiograms of the head were obtained in the working projections. Then, a [place] EX intermediate catheter was navigated through the risk catheter and over a via 17 microcatheter and a synchro 2 micro guidewire into the cavernous segment of the right ICA. Magnified frontal and lateral angiograms of the head were obtained in the working projections. The via microcatheter was then navigated over the wire into the left ICA terminus aneurysm pouch. The wire was removed. Then, a 4 x 2 mm web SL device was deployed into the aneurysm pouch. Frontal and lateral magnified angiograms of the head were obtained in the working projections. Adequate positioning of the device was noted. The device was then detached under fluoroscopy. Follow-up left internal carotid artery angiograms with magnified frontal and lateral views of the head in the working projections showed stable position of the device within the aneurysm pouch. Left ICA angiograms with frontal and lateral views of the entire head were then obtained, showed no evidence of thromboembolic complication. Flat panel CT of the head was obtained and post processed in a separate workstation with concurrent attending physician supervision. Selected images were sent to PACS. No evidence of hemorrhagic complication noted. The catheter was subsequently withdrawn. An inflatable band was placed and inflated over the right hand access site. The vascular sheath was withdrawn and the band was slowly deflated until brisk flow was noted through the arteriotomy site. At this point, the band was reinflated with additional 2 cc of air to obtain patent hemostasis. 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 No evidence of hemorrhagic of thromboembolic complication. PLAN: Patient will be admitted to ICU for observation. Electronically Signed   By: Pedro Earls M.D.   On: 07/20/2021 15:53   ECHOCARDIOGRAM COMPLETE  Result Date: 07/20/2021    ECHOCARDIOGRAM REPORT   Patient Name:   Shannon Oliver Date of Exam: 07/20/2021 Medical Rec #:  [id number]        Height:       66.0 in Accession #:    [id number]       Weight:       231.0 lb Date of Birth:  1960/12/18  BSA:          2.126 m Patient Age:    59 years         BP:           138/80 mmHg Patient Gender: F                HR:           66 bpm. Exam Location:  Inpatient Procedure: 2D Echo Indications:    Stroke  History:        Patient has prior history of Echocardiogram examinations, most                 recent 10/30/2020. Abnormal ECG; Risk Factors:Diabetes,                 Hypertension and Dyslipidemia.  Sonographer:    Arlyss Gandy Referring Phys: [id number] [place]  1. Left ventricular ejection fraction, by estimation, is 65 to 70%. Left ventricular ejection fraction by PLAX is 66 %. The left ventricle has normal function. The left ventricle has no regional wall motion abnormalities. There is moderate asymmetric left ventricular hypertrophy of the basal-septal segment. Left ventricular diastolic parameters are consistent with Grade I diastolic dysfunction (impaired relaxation).  2. Right ventricular systolic function is normal. The right ventricular size is normal.  3. The mitral valve is grossly normal. Trivial mitral valve regurgitation.  4. The aortic valve is tricuspid. Aortic valve regurgitation is not visualized. Aortic valve sclerosis/calcification is present, without any evidence of aortic stenosis.  5. The inferior vena cava is normal in size with greater than 50% respiratory variability, suggesting right atrial pressure of 3 mmHg. Comparison(s): No prior Echocardiogram.  10/30/2020: LVEF 70-75%. FINDINGS  Left Ventricle: Left ventricular ejection fraction, by estimation, is 65 to 70%. Left ventricular ejection fraction by PLAX is 66 %. The left ventricle has normal function. The left ventricle has no regional wall motion abnormalities. The left ventricular internal cavity size was normal in size. There is moderate asymmetric left ventricular hypertrophy of the basal-septal segment. Left ventricular diastolic parameters are consistent with Grade I diastolic dysfunction (impaired relaxation). Indeterminate filling pressures. Right Ventricle: The right ventricular size is normal. No increase in right ventricular wall thickness. Right ventricular systolic function is normal. Left Atrium: Left atrial size was normal in size. Right Atrium: Right atrial size was normal in size. Pericardium: There is no evidence of pericardial effusion. Mitral Valve: The mitral valve is grossly normal. Trivial mitral valve regurgitation. Tricuspid Valve: The tricuspid valve is grossly normal. Tricuspid valve regurgitation is trivial. Aortic Valve: The aortic valve is tricuspid. Aortic valve regurgitation is not visualized. Aortic valve sclerosis/calcification is present, without any evidence of aortic stenosis. Aortic valve mean gradient measures 7.0 mmHg. Aortic valve peak gradient measures 9.7 mmHg. Aortic valve area, by VTI measures 2.70 cm. Pulmonic Valve: The pulmonic valve was grossly normal. Pulmonic valve regurgitation is trivial. Aorta: The aortic root and ascending aorta are structurally normal, with no evidence of dilitation. Venous: The inferior vena cava is normal in size with greater than 50% respiratory variability, suggesting right atrial pressure of 3 mmHg. IAS/Shunts: No atrial level shunt detected by color flow Doppler.  LEFT VENTRICLE PLAX 2D LV EF:         Left            Diastology                ventricular     LV e' medial:  5.87 cm/s                ejection        LV E/e' medial:   12.0                fraction by     LV e' lateral:   8.70 cm/s                PLAX is 66      LV E/e' lateral: 8.1                %. LVIDd:         3.87 cm LVIDs:         2.49 cm LV PW:         1.22 cm LV IVS:        1.55 cm LVOT diam:     2.00 cm LV SV:         108 LV SV Index:   51 LVOT Area:     3.14 cm  RIGHT VENTRICLE RV S prime:     16.80 cm/s TAPSE (M-mode): 2.3 cm LEFT ATRIUM             Index LA diam:        2.70 cm 1.27 cm/m LA Vol (A2C):   55.4 ml 26.06 ml/m LA Vol (A4C):   34.1 ml 16.04 ml/m LA Biplane Vol: 44.3 ml 20.84 ml/m  AORTIC VALVE AV Area (Vmax):    2.94 cm AV Area (Vmean):   2.56 cm AV Area (VTI):     2.70 cm AV Vmax:           156.00 cm/s AV Vmean:          125.000 cm/s AV VTI:            0.399 m AV Peak Grad:      9.7 mmHg AV Mean Grad:      7.0 mmHg LVOT Vmax:         146.00 cm/s LVOT Vmean:        102.000 cm/s LVOT VTI:          0.343 m LVOT/AV VTI ratio: 0.86  AORTA Ao Root diam: 3.20 cm Ao Asc diam:  3.30 cm MITRAL VALVE MV Area (PHT): 2.12 cm     SHUNTS MV Decel Time: 357 msec     Systemic VTI:  0.34 m MV E velocity: 70.30 cm/s   Systemic Diam: 2.00 cm MV A velocity: 103.00 cm/s MV E/A ratio:  0.68 Lyman Bishop MD Electronically signed by Lyman Bishop MD Signature Date/Time: 07/20/2021/3:41:45 PM    Final    CT HEAD CODE STROKE WO CONTRAST`  Result Date: 07/20/2021 CLINICAL DATA:  Code stroke. EXAM: CT HEAD WITHOUT CONTRAST TECHNIQUE: Contiguous axial images were obtained from the base of the skull through the vertex without intravenous contrast. RADIATION DOSE REDUCTION: This exam was performed according to the departmental dose-optimization program which includes automated exposure control, adjustment of the mA and/or kV according to patient size and/or use of iterative reconstruction technique. COMPARISON:  06/08/2021 FINDINGS: Brain: No evidence of acute infarction, hemorrhage, cerebral edema, mass, mass effect, or midline shift. Ventricles and sulci are normal for age.  No extra-axial fluid collection. Periventricular white matter changes, likely the sequela of chronic small vessel ischemic disease. Vascular: No hyperdense vessel or unexpected calcification. Skull: Normal. Negative for fracture or focal lesion. Sinuses/Orbits: Mucosal thickening in the maxillary sinuses. Status post left lens  replacement. Other: The mastoid air cells are well aerated. ASPECTS [facility] Stroke Program Early CT Score) - Ganglionic level infarction (caudate, lentiform nuclei, internal capsule, insula, M1-M3 cortex): 7 - Supraganglionic infarction (M4-M6 cortex): 3 Total score (0-10 with 10 being normal): 10 IMPRESSION: 1. No acute intracranial process. 2. ASPECTS is 10 Code stroke imaging results were communicated on 07/20/2021 at 11:56 am to provider Dr. Lorrin Goodell via secure text paging. Electronically Signed   By: Merilyn Baba M.D.   On: 07/20/2021 11:57   CT ANGIO HEAD NECK W WO CM (CODE STROKE)  Result Date: 07/20/2021 CLINICAL DATA:  Neuro deficit, stroke suspected EXAM: CT ANGIOGRAPHY HEAD AND NECK TECHNIQUE: Multidetector CT imaging of the head and neck was performed using the standard protocol during bolus administration of intravenous contrast. Multiplanar CT image reconstructions and MIPs were obtained to evaluate the vascular anatomy. Carotid stenosis measurements (when applicable) are obtained utilizing NASCET criteria, using the distal internal carotid diameter as the denominator. RADIATION DOSE REDUCTION: This exam was performed according to the departmental dose-optimization program which includes automated exposure control, adjustment of the mA and/or kV according to patient size and/or use of iterative reconstruction technique. CONTRAST:  76m OMNIPAQUE IOHEXOL 350 MG/ML SOLN COMPARISON:  06/05/2021 CTA head, correlation is also made with same day CT head FINDINGS: CT HEAD FINDINGS For noncontrast findings, please see same day CT head. CTA NECK FINDINGS Aortic arch: Two-vessel  arch with a common origin of the brachiocephalic and left common carotid arteries. Imaged portion shows no evidence of aneurysm or dissection. No significant stenosis of the major arch vessel origins. Right carotid system: No evidence of dissection, stenosis (50% or greater) or occlusion. Calcified and noncalcified plaque at the bifurcation, which is not hemodynamically significant. Left carotid system: No evidence of dissection, stenosis (50% or greater) or occlusion. Calcified and noncalcified plaque at the bifurcation which is not hemodynamically significant. Vertebral arteries: Codominant. No evidence of dissection, stenosis (50% or greater) or occlusion. Skeleton: No acute osseous abnormality. Other neck: Negative. Upper chest: No focal pulmonary opacity or pleural effusion. Review of the MIP images confirms the above findings CTA HEAD FINDINGS Anterior circulation: Both internal carotid arteries are patent to the termini. Unchanged 3 mm supero medially projecting aneurysm arising from the distal cavernous right ICA (series 7, image 244). Interval coiling of the previously noted superiorly projecting aneurysm arising from the left ICA terminus at the origin of the left ACA and MCA (series 7, image 224). A1 segments patent, with redemonstrated hypoplastic right A1. Normal anterior communicating artery. Anterior cerebral arteries are patent to their distal aspects. No M1 stenosis or occlusion. Normal MCA bifurcations. Distal MCA branches perfused and symmetric. Posterior circulation: Vertebral arteries patent to the vertebrobasilar junction without stenosis. Posterior inferior cerebral arteries patent bilaterally. Basilar patent to its distal aspect. Superior cerebellar arteries patent bilaterally. Bilateral P1 segments originate from the basilar artery. PCAs perfused to their distal aspects without stenosis. The bilateral posterior communicating arteries are patent. Venous sinuses: As permitted by contrast  timing, patent. Anatomic variants: None significant Review of the MIP images confirms the above findings IMPRESSION: 1. 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 2.  No intracranial large vessel occlusion or significant stenosis. 3.  No hemodynamically significant stenosis in the neck. 4. Unchanged 3 mm aneurysm projecting from the distal cavernous right ICA. Code stroke imaging results were communicated on 07/20/2021 at 12:39 pm to provider Dr. KLorrin Goodellvia secure text paging. Electronically Signed   By: ABryson Ha  Vasan M.D.   On: 07/20/2021 12:40   IR ANGIO INTRA EXTRACRAN SEL INTERNAL CAROTID UNI R MOD SED  Result Date: 07/20/2021 INDICATION: Shannon Oliver is a 61 year old female with past medical history significant for diabetes, hyperlipidemia, hypertension and cataract. She presented to the emergency room with headache on 06/05/2021. 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 She underwent a diagnostic cerebral angiogram on 06/19/2021 that confirmed the presence of the 2 brain aneurysms seen on prior CTA. She comes to our service today for elective treatment of her left ICA aneurysm. In preparation to today's procedure, she was started on Brilinta 90 mg b.i.d. and aspirin 81 mg q.d. EXAM: ULTRASOUND-GUIDED VASCULAR ACCESS DIAGNOSTIC CEREBRAL ANGIOGRAM ENDOVASCULAR ANEURYSM EMBOLIZATION FLAT PANEL HEAD CT COMPARISON:  Cerebral angiogram June 18, 2021. MEDICATIONS: Ancef 2 g IV. The antibiotic was administered within 1 hour of the procedure. ANESTHESIA/SEDATION: The study was performed in the general anesthesia. CONTRAST:  106 cc of Omnipaque 300 milligram/mL FLUOROSCOPY TIME:  Fluoroscopy Time: 42 minutes 48 seconds (1,843 mGy). COMPLICATIONS: Delayed - approximately one hour after procedure, patient developed left sided weakness, unclear whether procedure related given intervention in the left ICA.  TECHNIQUE: Informed written consent was obtained from the patient after a thorough discussion of the procedural risks, benefits and alternatives. All questions were addressed. Maximal Sterile Barrier Technique was utilized including caps, mask, sterile gowns, sterile gloves, sterile drape, hand hygiene and skin antiseptic. A timeout was performed prior to the initiation of the procedure. Using the modified Seldinger technique and a micropuncture kit, access was gained to the distal right radial artery at the anatomical snuffbox and a 7 French sheath was placed. Real-time ultrasound guidance was utilized for vascular access including the acquisition of a permanent ultrasound image documenting patency of the accessed vessel. Slow intra arterial infusion of 5,000 IU heparin, 5 mg Verapamil and 200 mcg nitroglycerin diluted in patient's own blood was performed. No significant fluctuation in patient's blood pressure seen. Then, a right radial artery angiogram was obtained via sheath side port. Next, a 5 [place] Simmons 2 glide catheter was navigated over a 0.035" Terumo Glidewire into the right subclavian artery under fluoroscopic guidance. The catheter was then advanced into the left common carotid artery. Frontal and lateral angiograms of the neck were obtained. Using biplane roadmap guidance, the catheter was then advanced into the left internal carotid artery. Frontal, lateral, magnified waters and magnified lateral views of the head were obtained. FINDINGS: 1. Normal brachial artery branching pattern seen. No significant anatomical variation. The right radial artery caliber is adequate for vascular access. 2. 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 3. There is brisk vascular contrast filling of the bilateral ACA and left MCA vascular trees. The visualized dural sinuses are patent. PROCEDURE: The Simmons 2 glide catheter was exchanged over the wire and under biplane  roadmap for a 7 [place] Rist catheter which was placed in the distal cervical segment of the left ICA. Magnified frontal and lateral angiograms of the head were obtained in the working projections. Then, a [place] EX intermediate catheter was navigated through the risk catheter and over a via 17 microcatheter and a synchro 2 micro guidewire into the cavernous segment of the right ICA. Magnified frontal and lateral angiograms of the head were obtained in the working projections. The via microcatheter was then navigated over the  wire into the left ICA terminus aneurysm pouch. The wire was removed. Then, a 4 x 2 mm web SL device was deployed into the aneurysm pouch. Frontal and lateral magnified angiograms of the head were obtained in the working projections. Adequate positioning of the device was noted. The device was then detached under fluoroscopy. Follow-up left internal carotid artery angiograms with magnified frontal and lateral views of the head in the working projections showed stable position of the device within the aneurysm pouch. Left ICA angiograms with frontal and lateral views of the entire head were then obtained, showed no evidence of thromboembolic complication. Flat panel CT of the head was obtained and post processed in a separate workstation with concurrent attending physician supervision. Selected images were sent to PACS. No evidence of hemorrhagic complication noted. The catheter was subsequently withdrawn. An inflatable band was placed and inflated over the right hand access site. The vascular sheath was withdrawn and the band was slowly deflated until brisk flow was noted through the arteriotomy site. At this point, the band was reinflated with additional 2 cc of air to obtain patent hemostasis. 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 No evidence of hemorrhagic of thromboembolic complication. PLAN: Patient will be admitted to ICU for  observation. Electronically Signed   By: Pedro Earls M.D.   On: 07/20/2021 15:53       HISTORY OF PRESENT ILLNESS Shannon Oliver is a 61 y.o. female with PMH significant for DM2, HTN, HLD, obesity, OSA who underwent R radial approach elective cerebral angio for left ICA aneurysm embolization. Post procedure reported decreased sensation on the left compared to the right but was noted to be moving all extremities. Around 1115 AM, was noted to be weak in LUE and LLE and a stroke code was subsequently activated. She received TNKase. She showed significant improvement of her left sided weakness throughout the day yesterday.  Unfortunately, she developed a hematoma in her right hand likely due to delayed TNK related bleeding at the artery puncture site.  No progression of hand swelling after manual pressure was held at the puncture site for approximately 1 hour.  Pulse oxymetry in right hand remained stable. Hand swelling improved this morning with improved mobility of fingers.  She had a very significant improvement of her left-sided weakness as compared to yesterday, regaining strength in her right upper extremity completely.  However, there is a residual left lower extremity weakness for which PT recommends acute inpatient rehab.  MRI of the brain showed multiple small punctate infarcts in the bilateral ACA and left MCA territory which are consistent with post intervention hits given that both ACAs are supplied by the left ICA with minimal contribution from the right ICA. Given that no stent was used in the endovascular intervention, she can safely discontinue the Brilinta and should continue on ASA 81 mg q.d. for a week.  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 ASPECTS 10.    CTA head & neck s/p coiling of left ICA aneurysm,  no LVO or significant stenosis, 62m right ICA aneurysm MRI  scattered small and punctate infarcts in bialteral cerbral hemispheres with no hemorrhage or mass effect, chronic small vessel disease 2D Echo EF 610-17% grade 1 diastolic dysfunction, no atrial level shunt LDL 69 HgbA1c 8.4 VTE prophylaxis -  SCDs aspirin 81 mg daily and Brilinta (ticagrelor) 90 mg bid prior to admission, now on aspirin Therapy recommendations: CLR disposition:  pending   S/p coiling of left ICA aneurysm Resuma antiplatelet agents when patient is 24 hours out from TNK Continue to monitor right hand for swelling and bruising (improved on exam today)   Hypertension Home meds:  amlodipine 10 mg daily, carvedilol 25 mg daily, losartan 100 mg daily, spironolactone 25 mg daily Stable Keep BP <180/105 Long-term BP goal normotensive   Hyperlipidemia Home meds:  rosuvastatin 40 mg daily, resumed in hospital LDL 69, goal < 70 Continue statin at discharge   Diabetes type II Uncontrolled Home meds:  Farxiga 5 mg daily, insulin aspart 8 units TIDAC, metformin 1000 mg daily HgbA1c 8.4, goal < 7.0 CBGs SSI   Other Stroke Risk Factors Obesity, Body mass index is 37.28 kg/m., BMI >/= 30 associated with increased stroke risk, recommend weight loss, diet and exercise as appropriate  Obstructive sleep apnea, on CPAP at home  DISCHARGE EXAM Blood pressure 136/77, pulse 63, temperature 98.5 F (36.9 C), temperature source Oral, resp. rate 18, height _0  (1.676 m), weight 104.8 kg, last menstrual period 08/02/2013, SpO2 99 %. General:  Alert, well-nourished, well-developed female in no acute distress.  Swelling and bruising of right hand noted but improved from yesterday per patient     NEURO:  Mental Status: AA&Ox3  Speech/Language: speech is without dysarthria or aphasia.  Naming, fluency, and comprehension intact.   Cranial Nerves:  II: PERRL. Visual fields full.  III, IV, VI: EOMI. Eyelids elevate  symmetrically.  V: Sensation is intact to light touch and symmetrical to face.  VII: Smile is symmetrical.   VIII: hearing intact to voice. IX, X:  Phonation is normal.  XII: tongue is midline without fasciculations. Motor: 5/5 strength to RUE, RLE and LUE, 4/5 to LLE, drift noted in LLE. Tone: is normal and bulk is normal Sensation- Intact to light touch bilaterally.   Coordination: FTN intact bilaterally, HKS: no ataxia in BLE.No drift.  Gait- deferred   DISCHARGE PLAN Disposition:  Transfer to [facility] for ongoing PT, OT and ST aspirin 81 mg daily for secondary stroke prevention Recommend ongoing stroke risk factor control by Primary Care Physician at time of discharge from inpatient rehabilitation. Follow-up PCP Leeroy Cha, MD in 2 weeks following discharge from rehab. Follow-up in [facility] in 4 weeks following discharge from rehab, office to schedule an appointment.   35 minutes were spent preparing discharge.  Patient seen and examined by NP/APP with MD. MD to update note as needed.   Janine Ores, DNP, FNP-BC [facility] Pager: [phone]  I have personally obtained history,examined this patient, reviewed notes, independently viewed imaging studies, participated in medical decision making and plan of care.ROS completed by me personally and pertinent positives fully documented  I have made any additions or clarifications directly to the above note. Agree with note above.    Antony Contras, MD Medical Director [facility] Pager: [phone] 07/23/2021 6:09 PM

## 2021-07-23 NOTE — Progress Notes (Signed)
Inpatient Rehabilitation Admission Medication Review by a Pharmacist  A complete drug regimen review was completed for this patient to identify any potential clinically significant medication issues.  High Risk Drug Classes Is patient taking? Indication by Medication  Antipsychotic No   Anticoagulant No   Antibiotic No   Opioid No   Antiplatelet Yes ASA for CVA prevention  Hypoglycemics/insulin Yes SSI for DM  Vasoactive Medication Yes Coreg, Losartan, Novasc for HTN  Chemotherapy No   Other No      Type of Medication Issue Identified Description of Issue Recommendation(s)  Drug Interaction(s) (clinically significant)     Duplicate Therapy     Allergy     No Medication Administration End Date     Incorrect Dose     Additional Drug Therapy Needed  Benzonatate, MV, Kdur, Prednisolone ophthalmic, spironolactone - Farxiga, metformin, Toujeo Resume at discharge - Resume as DM dictates.  Significant med changes from prior encounter (inform family/care partners about these prior to discharge). D/c Brilinta   Other       Clinically significant medication issues were identified that warrant physician communication and completion of prescribed/recommended actions by midnight of the next day:  No  Time spent performing this drug regimen review (minutes):  70min  Coda Filler S. Alford Highland, PharmD, BCPS Clinical Staff Pharmacist [email or web address] [place] 07/23/2021 5:23 PM

## 2021-07-23 NOTE — Progress Notes (Signed)
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 Post procedure, pt wit left side decreased sensation. 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 PMH: DM2, HTN, HLD, obesity.   OT comments  Patient received in bed and agreeable to OT session and able to get to EOB. Patient seen for functional mobility with RW and mod assist +2 for safety. Patient had complaints of right hand soreness and decreased fine and gross motor. Patient provided exercise ball and instructions on exercises to increase functional strength. Education provided on keeping RUE elevated and retrograde message. Acute OT to continue to follow.    Recommendations for follow up therapy are one component of a multi-disciplinary discharge planning process, led by the attending physician.  Recommendations may be updated based on patient status, additional functional criteria and insurance authorization.    Follow Up Recommendations  Acute inpatient rehab (3hours/day)    Assistance Recommended at Discharge Frequent or constant Supervision/Assistance  Patient can return home with the following  A lot of help with walking and/or transfers;A little help with bathing/dressing/bathroom;Direct supervision/assist for medications management;Direct supervision/assist for financial management   Equipment Recommendations  BSC/3in1    Recommendations for Other Services      Precautions / Restrictions Precautions Precautions: Fall Precaution Comments: intermittent LLE buckling Restrictions Weight Bearing Restrictions: No       Mobility Bed Mobility                    Transfers                         Balance                                            ADL either performed or assessed with clinical judgement   ADL                                              Extremity/Trunk Assessment Upper Extremity Assessment RUE Deficits / Details: edema R hand noted but pt able to put weight through it with use of RW. RUE Coordination: decreased fine motor LUE Deficits / Details: Decreased coorindation gross and FM. Able to perform finger opposition with increased time. Performing finger-to-nose with increased time. LUE Coordination: decreased fine motor;decreased gross motor            Vision       Perception     Praxis      Cognition                                                Exercises Other Exercises Other Exercises: provided patient with squeeze ball and instructions on exercises to increase fine and gross motor for RUE. Other Exercises: instructions on retro grade message for RUE    Shoulder Instructions       General Comments HR 76 bpm    Pertinent Vitals/ Pain  Home Living                                          Prior Functioning/Environment              Frequency  Min 3X/week        Progress Toward Goals  OT Goals(current goals can now be found in the care plan section)  Progress towards OT goals: Progressing toward goals  Acute Rehab OT Goals Patient Stated Goal: get better OT Goal Formulation: With patient/family Time For Goal Achievement: 08/04/21 Potential to Achieve Goals: Good ADL Goals Pt Will Perform Grooming: with min guard assist;standing Pt Will Perform Upper Body Dressing: sitting;with set-up;with supervision Pt Will Perform Lower Body Dressing: with min guard assist;sit to/from stand Pt Will Transfer to Toilet: with min guard assist;ambulating;bedside commode Pt Will Perform Toileting - Clothing Manipulation and hygiene: with min guard assist;sitting/lateral leans;sit to/from stand  Plan Discharge plan remains  appropriate    Co-evaluation    PT/OT/SLP Co-Evaluation/Treatment: Yes Reason for Co-Treatment: For patient/therapist safety;To address functional/ADL transfers PT goals addressed during session: Mobility/safety with mobility;Balance;Proper use of DME;Strengthening/ROM OT goals addressed during session: Strengthening/ROM      AM-PAC OT "6 Clicks" Daily Activity     Outcome Measure   Help from another person eating meals?: None Help from another person taking care of personal grooming?: A Little Help from another person toileting, which includes using toliet, bedpan, or urinal?: A Lot Help from another person bathing (including washing, rinsing, drying)?: A Lot Help from another person to put on and taking off regular upper body clothing?: A Little Help from another person to put on and taking off regular lower body clothing?: A Lot 6 Click Score: 16    End of Session Equipment Utilized During Treatment: Gait belt;Rolling walker (2 wheels)  OT Visit Diagnosis: Unsteadiness on feet (R26.81);Other abnormalities of gait and mobility (R26.89);Muscle weakness (generalized) (M62.81);Hemiplegia and hemiparesis Hemiplegia - Right/Left: Left Hemiplegia - dominant/non-dominant: Non-Dominant Hemiplegia - caused by: Cerebral infarction   Activity Tolerance Patient tolerated treatment well   Patient Left in bed;with call bell/phone within reach;with family/visitor present;Other (comment) (seated on EOB)   Nurse Communication Mobility status        Time: 2355-7322 OT Time Calculation (min): 23 min  Charges: OT General Charges $OT Visit: 1 Visit OT Treatments $Therapeutic Activity: 8-22 mins  Lodema Hong, OTA Acute Rehabilitation Services  Pager [phone] Office [place] 07/23/2021, 12:40 PM

## 2021-07-23 NOTE — Plan of Care (Signed)
Insulin not given Trays still not on unit.  Problem: Education: Goal: Knowledge of General Education information will improve Description: Including pain rating scale, medication(s)/side effects and non-pharmacologic comfort measures Outcome: Progressing   Problem: Health Behavior/Discharge Planning: Goal: Ability to manage health-related needs will improve Outcome: Progressing   Problem: Clinical Measurements: Goal: Ability to maintain clinical measurements within normal limits will improve Outcome: Progressing Goal: Will remain free from infection Outcome: Progressing Goal: Diagnostic test results will improve Outcome: Progressing Goal: Respiratory complications will improve Outcome: Progressing Goal: Cardiovascular complication will be avoided Outcome: Progressing   Problem: Activity: Goal: Risk for activity intolerance will decrease Outcome: Progressing   Problem: Nutrition: Goal: Adequate nutrition will be maintained Outcome: Progressing   Problem: Coping: Goal: Level of anxiety will decrease Outcome: Progressing   Problem: Elimination: Goal: Will not experience complications related to bowel motility Outcome: Progressing Goal: Will not experience complications related to urinary retention Outcome: Progressing   Problem: Pain Managment: Goal: General experience of comfort will improve Outcome: Progressing   Problem: Safety: Goal: Ability to remain free from injury will improve Outcome: Progressing   Problem: Skin Integrity: Goal: Risk for impaired skin integrity will decrease Outcome: Progressing   Problem: Education: Goal: Knowledge of disease or condition will improve Outcome: Progressing Goal: Knowledge of secondary prevention will improve (SELECT ALL) Outcome: Progressing Goal: Knowledge of patient specific risk factors will improve (INDIVIDUALIZE FOR PATIENT) Outcome: Progressing Goal: Individualized Educational Video(s) Outcome: Progressing    Problem: Coping: Goal: Will verbalize positive feelings about self Outcome: Progressing Goal: Will identify appropriate support needs Outcome: Progressing   Problem: Health Behavior/Discharge Planning: Goal: Ability to manage health-related needs will improve Outcome: Progressing   Problem: Self-Care: Goal: Ability to participate in self-care as condition permits will improve Outcome: Progressing Goal: Verbalization of feelings and concerns over difficulty with self-care will improve Outcome: Progressing Goal: Ability to communicate needs accurately will improve Outcome: Progressing   Problem: Nutrition: Goal: Risk of aspiration will decrease Outcome: Progressing Goal: Dietary intake will improve Outcome: Progressing   Problem: Ischemic Stroke/TIA Tissue Perfusion: Goal: Complications of ischemic stroke/TIA will be minimized Outcome: Progressing

## 2021-07-23 NOTE — PMR Pre-admission (Signed)
PMR Admission Coordinator Pre-Admission Assessment  Patient: Shannon Oliver is an 61 y.o., female MRN: [id number] DOB: 01-04-1961 Height: _0  (167.6 cm) Weight: 104.8 kg  Insurance Information HMO:     PPO: yes     PCP:      IPA:      80/20:      OTHER:  PRIMARY: State BC[employer] [place]      Policy#: [id number]      Subscriber: pt CM Name: Levy Pupa      Phone#: [phone]     Fax#: [phone] Pre-Cert#: [id number]       Employer:  Benefits:  Phone #: [phone]     Name: 1/19 Eff. Date: 07/05/2021     Deduct: $1250      Out of Pocket Max: $4890      Life Max: none CIR: 80%      SNF: 80% Outpatient: $10 to $52 per visit     Co-Pay: visits per medical neccesity Home Health: 80%      Co-Pay: visits per medical neccesity DME: 80%     Co-Pay: 20% Providers: in network  SECONDARY: none       Financial Counselor:       Phone#:   The [facility] for patients in Inpatient Rehabilitation Facilities with attached Privacy Act [place] Records was provided and verbally reviewed with: N/A  Emergency Contact Information Contact Information     Name Relation Home Work [place] Spouse [phone]  [phone]   Duff,Tabitha Sister [phone]  [phone]       Current Medical History  Patient Admitting Diagnosis: aneurysm, CVA  History of Present Illness: 61 year old right-handed female with history of diabetes mellitus, hypertension, hyperlipidemia, obesity with BMI 37.28, OSA, question medical compliance.    Independent prior to admission working as a Training and development officer at [employer].  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  Post procedure left side decreased sensation as well as left-sided weakness.  MRI of the brain showed scattered mostly punctate small acute infarcts in the cerebral hemispheres left greater  than right among the most conspicuous 6 mm lacune in the left external capsule abutting the lentiform.  No associated hemorrhage or mass-effect.  CT angiogram head and neck no intracranial large vessel occlusion or significant stenosis.  Echocardiogram with ejection fraction of 65 to 70% no wall motion abnormalities grade 1 diastolic dysfunction.  Patient is currently maintained on low-dose aspirin and no plan for Brilinta.  She did initially need Cleviprex for blood pressure control.     Complete NIHSS TOTAL: 1  Patient's medical record from [facility] has been reviewed by the rehabilitation admission coordinator and physician.  Past Medical History  Past Medical History:  Diagnosis Date   Abnormal EKG    LVH with strain   Acid reflux    Asthma    Depression    Diabetes mellitus    A1C over 9   HLD (hyperlipidemia)    Hypertension    Noncompliance    Obesity    Pneumonia    Sleep apnea     Has the patient had major surgery during 100 days prior to admission? Yes  Family History   family history includes Breast cancer in her cousin and maternal aunt; Cancer in her mother; Diabetes in her mother; Hypertension in her mother.  Current Medications  Current Facility-Administered Medications:    acetaminophen (TYLENOL) tablet  650 mg, 650 mg, Oral, Q4H PRN, 650 mg at 07/23/21 1411 **OR** acetaminophen (TYLENOL) 160 MG/5ML solution 650 mg, 650 mg, Per Tube, Q4H PRN **OR** acetaminophen (TYLENOL) suppository 650 mg, 650 mg, Rectal, Q4H PRN, de Sindy Messing, [place], MD   amLODipine (NORVASC) tablet 10 mg, 10 mg, Oral, Daily, Shafer, Devon, NP, 10 mg at 07/23/21 1449   aspirin EC tablet 81 mg, 81 mg, Oral, Daily, Leonie Man, Pramod S, MD, 81 mg at 07/23/21 1024   carvedilol (COREG) tablet 37.5 mg, 37.5 mg, Oral, BID WC, Shafer, Devon, NP, 37.5 mg at 07/23/21 1023   Chlorhexidine Gluconate Cloth 2 % PADS 6 each, 6 each, Topical, Daily, de Sindy Messing, [place], MD, 6 each at  07/22/21 0916   influenza vac split quadrivalent PF (FLUARIX) injection 0.5 mL, 0.5 mL, Intramuscular, Tomorrow-1000, Khaliqdina, Salman, MD   insulin aspart (novoLOG) injection 0-5 Units, 0-5 Units, Subcutaneous, QHS, Shafer, Devon, NP, 2 Units at 07/22/21 2159   insulin aspart (novoLOG) injection 0-9 Units, 0-9 Units, Subcutaneous, TID WC, Shafer, Devon, NP, 3 Units at 07/23/21 1224   ketorolac (TORADOL) 30 MG/ML injection 30 mg, 30 mg, Intravenous, Q6H PRN, Deveshwar, Sanjeev, MD, 30 mg at 07/21/21 2009   losartan (COZAAR) tablet 100 mg, 100 mg, Oral, Daily, Shafer, Devon, NP, 100 mg at 07/23/21 1023   melatonin tablet 5 mg, 5 mg, Oral, QHS PRN, Donnetta Simpers, MD, 5 mg at 07/22/21 2159   multivitamin with minerals tablet 1 tablet, 1 tablet, Oral, Daily, Donnetta Simpers, MD, 1 tablet at 07/23/21 1024   ondansetron (ZOFRAN) injection 4 mg, 4 mg, Intravenous, Q6H PRN, de Sindy Messing, [place], MD   pantoprazole (PROTONIX) EC tablet 40 mg, 40 mg, Oral, QHS, Khaliqdina, Alferd Patee, MD, 40 mg at 07/22/21 2159   pneumococcal 23 valent vaccine (PNEUMOVAX-23) injection 0.5 mL, 0.5 mL, Intramuscular, Tomorrow-1000, Khaliqdina, Salman, MD   rosuvastatin (CRESTOR) tablet 40 mg, 40 mg, Oral, Daily, Donnetta Simpers, MD, 40 mg at 07/23/21 1024   senna-docusate (Senokot-S) tablet 1 tablet, 1 tablet, Oral, QHS PRN, Donnetta Simpers, MD, 1 tablet at 07/23/21 1224   sodium chloride (MURO 128) 2 % ophthalmic solution 1 drop, 1 drop, Left Eye, Daily PRN, Donnetta Simpers, MD  Patients Current Diet:  Diet Order             Diet Heart Room service appropriate? Yes; Fluid consistency: Thin  Diet effective now                  Precautions / Restrictions Precautions Precautions: Fall Precaution Comments: intermittent LLE buckling Restrictions Weight Bearing Restrictions: No   Has the patient had 2 or more falls or a fall with injury in the past year? No  Prior Activity Level Community  (5-7x/wk): Independent, working and driving  Prior Functional Level Self Care: Did the patient need help bathing, dressing, using the toilet or eating? Independent  Indoor Mobility: Did the patient need assistance with walking from room to room (with or without device)? Independent  Stairs: Did the patient need assistance with internal or external stairs (with or without device)? Independent  Functional Cognition: Did the patient need help planning regular tasks such as shopping or remembering to take medications? Independent  Patient Information Are you of Hispanic, Latino/a,or Spanish origin?: A. No, not of Hispanic, Latino/a, or Spanish origin What is your race?: B. Black or African American Do you need or want an interpreter to communicate with a doctor or health care staff?: 0. No  Patient's Response To:  Health Literacy and Transportation Is the patient able to respond to health literacy and transportation needs?: Yes Health Literacy - How often do you need to have someone help you when you read instructions, pamphlets, or other written material from your doctor or pharmacy?: Never In the past 12 months, has lack of transportation kept you from medical appointments or from getting medications?: No In the past 12 months, has lack of transportation kept you from meetings, work, or from getting things needed for daily living?: No  Home Assistive Devices / [place] Devices/Equipment: CPAP, Cane (specify quad or straight), Blood pressure cuff, CBG Meter, Eyeglasses, Scales Home Equipment: Shower seat, Hand held shower head, Grab bars - tub/shower  Prior Device Use: Indicate devices/aids used by the patient prior to current illness, exacerbation or injury? None of the above  Current Functional Level Cognition  Arousal/Alertness: Awake/alert Overall Cognitive Status: Impaired/Different from baseline Orientation Level: Oriented X4 General Comments: aware of deficits,  pt self-directed and eager to progress mobility with fair safety awareness. Attention: Focused, Sustained, Selective Focused Attention: Appears intact Sustained Attention: Appears intact Selective Attention: Impaired Selective Attention Impairment: Verbal complex Memory: Impaired Memory Impairment: Retrieval deficit, Decreased short term memory (Immediate: 5/5; delayed: 2/5; with cues: 3/3) Awareness: Appears intact Problem Solving: Appears intact Executive Function: Writer: Impaired Organizing Impairment: Verbal complex (backward digit span: 1/2)    Extremity Assessment (includes Sensation/Coordination)  Upper Extremity Assessment: RUE deficits/detail, LUE deficits/detail RUE Deficits / Details: edema R hand noted but pt able to put weight through it with use of RW. RUE Coordination: decreased fine motor LUE Deficits / Details: Decreased coorindation gross and FM. Able to perform finger opposition with increased time. Performing finger-to-nose with increased time. LUE Coordination: decreased fine motor, decreased gross motor  Lower Extremity Assessment: Defer to PT evaluation LLE Deficits / Details: hip flex >3/5, knee ext 3+/5 but unable to maintain consistent contraction, experiences sudden failure of contraction on MMT and in standing, seems more apraxic that pure strength loss LLE Sensation: decreased proprioception LLE Coordination: decreased gross motor    ADLs  Overall ADL's : Needs assistance/impaired Eating/Feeding: Set up, Supervision/ safety, Sitting Grooming: Wash/dry face, Set up, Supervision/safety, Sitting Upper Body Bathing: Minimal assistance, Sitting Lower Body Bathing: Minimal assistance, +2 for physical assistance, +2 for safety/equipment, Sit to/from stand Upper Body Dressing : Minimal assistance, Sitting Lower Body Dressing: +2 for physical assistance, +2 for safety/equipment, Moderate assistance Toilet Transfer: Minimal assistance, +2 for  physical assistance, +2 for safety/equipment, Ambulation, BSC/3in1 Toileting- Clothing Manipulation and Hygiene: Minimal assistance, +2 for physical assistance, +2 for safety/equipment, Sit to/from stand Toileting - Clothing Manipulation Details (indicate cue type and reason): Min A +2 for balance in standing. Pt able to perform peri care with assistance for balance Functional mobility during ADLs: +2 for physical assistance, +2 for safety/equipment, Rolling walker (2 wheels), Moderate assistance General ADL Comments: Pt presenting with decreased balance, strength, and coorindation.    Mobility  Overal bed mobility: Needs Assistance Bed Mobility: Supine to Sit Supine to sit: Supervision, HOB elevated General bed mobility comments: use of bed features, increased time to perform    Transfers  Overall transfer level: Needs assistance Equipment used: Rolling walker (2 wheels) Transfers: Sit to/from Stand Sit to Stand: Min guard Bed to/from chair/wheelchair/BSC transfer type:: Stand pivot Stand pivot transfers: Min assist, +2 safety/equipment General transfer comment: light rise and steady assist, STS x4 during session x1 from EOB x3 from chair in rehab gym    Ambulation /  Gait / Stairs / Emergency planning/management officer  Ambulation/Gait Ambulation/Gait assistance: Min guard, Mod assist Gait Distance (Feet): 100 Feet (x2 to/from rehab gym and seated break between trials) Assistive device: Rolling walker (2 wheels), 1 person hand held assist Gait Pattern/deviations: Decreased weight shift to left, Decreased stance time - left, Decreased step length - left, Decreased dorsiflexion - left, Drifts right/left General Gait Details: with HHA on LUE, pt needing up to modA (~62f trial), otherwise min guard assist with RW with very slow gait speed, indicating increased fall risk and tendency to veer toward L side of hallway, of note RW noted to pull slightly to left, when RW switched out pt able to maintain better  path in hallway but needing reminder to maintain middle pathway in hall. No buckling with RW but near-buckling multiple times with LUE HHA and increased postural sway. Gait velocity: grossly <0.4 m/s; variable 0.1 to 0.3 m/s depending on AD used Gait velocity interpretation: <1.31 ft/sec, indicative of household ambulator Stairs: Yes Stairs assistance: Min assist Stair Management: One rail Right, One rail Left, Forwards, Step to pattern Number of Stairs: 2 General stair comments: cues for step sequencing with no carryover from previous session; pt hesitant but with proper sequencing, no overt buckling, LLE guarded for safety each step-up and down during stance phase    Posture / Balance Balance Overall balance assessment: Needs assistance Sitting-balance support: No upper extremity supported, Feet supported Sitting balance-Leahy Scale: Good Standing balance support: Bilateral upper extremity supported, During functional activity Standing balance-Leahy Scale: Poor Standing balance comment: needed UE and external support when only given U UE support; with BUE support, just min guard for safety    Special needs/care consideration    Previous Home Environment  Living Arrangements: Spouse/significant other  Lives With: Spouse Available Help at Discharge: Family, Available 24 hours/day Type of Home: House Home Layout: One level Home Access: Ramped entrance Bathroom Shower/Tub: WMultimedia programmer Handicapped height Home Care Services: No Additional Comments: pt's husband home 24/7. He has LLE prosthesis, does not work, does not drive, but ambulates with cane and appears cognitively intact.  Discharge Living Setting Plans for Discharge Living Setting: Patient's home, Lives with (comment) (spouse) Type of Home at Discharge: House Discharge Home Layout: One level Discharge Home Access: [place]rance Discharge Bathroom Shower/Tub: Walk-in shower Discharge Bathroom Toilet:  Handicapped height Discharge Bathroom Accessibility: Yes How Accessible: Accessible via walker Does the patient have any problems obtaining your medications?: No  Social/Family/Support Systems Patient Roles: Spouse (employee) Contact Information: spouse, KChrissie NoaAnticipated Caregiver: spouse Anticipated CAmbulance personInformation: see contacts Ability/Limitations of Caregiver: spouse with prosthesis and uses cane Caregiver Availability: 24/7 Discharge Plan Discussed with Primary Caregiver: Yes Is Caregiver In Agreement with Plan?: Yes Does Caregiver/Family have Issues with Lodging/Transportation while Pt is in Rehab?: No  Goals Patient/Family Goal for Rehab: Mod I with PT, OT and SLP Expected length of stay: ELOS 5 to 7 days Pt/Family Agrees to Admission and willing to participate: Yes Program Orientation Provided & Reviewed with Pt/Caregiver Including Roles  & Responsibilities: Yes  Decrease burden of Care through IP rehab admission: n/a  Possible need for SNF placement upon discharge: not anticipated  Patient Condition: [facility] spoken with CM, and patient and spouse. I met with patient at the bedside for inpatient rehabilitation assessment.  Patient will benefit from ongoing PT, OT, and SLP, can actively participate in 3 hours of therapy a day 5 days of the week, and  can make measurable gains during the admission.  Patient will also benefit from the coordinated team approach during an Inpatient Acute Rehabilitation admission.  The patient will receive intensive therapy as well as Rehabilitation physician, nursing, social worker, and care management interventions.  Due to bladder management, bowel management, safety, skin/wound care, disease management, medication administration, pain management, and patient education the patient requires 24 hour a day rehabilitation nursing.  The patient is currently min assist overall with mobility  and basic ADLs.  Discharge setting and therapy post discharge at home with outpatient is anticipated.  Patient has agreed to participate in the Acute Inpatient Rehabilitation Program and will admit today.  Preadmission Screen Completed By:  Cleatrice Burke, 07/23/2021 2:51 PM ______________________________________________________________________   Discussed status with Dr. Dagoberto Ligas on 07/23/2021 at 1451 and received approval for admission today.  Admission Coordinator:  Cleatrice Burke, RN, time  0228 Date  07/23/2021   Assessment/Plan: Diagnosis: Does the need for close, 24 hr/day Medical supervision in concert with the patient's rehab needs make it unreasonable for this patient to be served in a less intensive setting? Yes Co-Morbidities requiring supervision/potential complications: DM O0A 8.4; HTN, BMI 37; B/L cerebral CVA's L hemiparesis Due to bladder management, bowel management, safety, skin/wound care, disease management, medication administration, pain management, and patient education, does the patient require 24 hr/day rehab nursing? Yes Does the patient require coordinated care of a physician, rehab nurse, PT, OT, and SLP to address physical and functional deficits in the context of the above medical diagnosis(es)? Yes Addressing deficits in the following areas: balance, endurance, locomotion, strength, transferring, bowel/bladder control, bathing, dressing, feeding, grooming, toileting, and cognition Can the patient actively participate in an intensive therapy program of at least 3 hrs of therapy 5 days a week? Yes The potential for patient to make measurable gains while on inpatient rehab is good Anticipated functional outcomes upon discharge from inpatient rehab: modified independent and supervision PT, modified independent and supervision OT, modified independent and supervision SLP Estimated rehab length of stay to reach the above functional goals is: 5-7  days Anticipated discharge destination: Home 10. Overall Rehab/Functional Prognosis: good   MD Signature:

## 2021-07-23 NOTE — Progress Notes (Signed)
Inpatient Rehabilitation Admissions Coordinator   I have insurance approval and can admit to CIR today. I have notified Dr Leonie Man, acute tem and TOC. I will make the arrangements to admit today.  Danne Baxter, RN, MSN Rehab Admissions Coordinator [phone] 07/23/2021 2:58 PM

## 2021-07-23 NOTE — Progress Notes (Signed)
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  Patient developed left UE/LE weakness 1-1.5 hours post procedure, Code Stroke was initiated and patient was admitted under the care of the Stroke team  Subjective:  Patient sitting in bed, NAD. Family member at bedside.  She stats that she was quite frustrated today as it took her 10 minutes to sign some paper works this morning due to blurry and double vision.  Vision changes only occur when patient is looking at a subject in short distance.  She reports that right hand is still swollen but it is improving.  Awaiting insurance authorization and bed placement in inpatient rehab.   Allergies: Canagliflozin and Hydrocodone  Medications: Prior to Admission medications   Medication Sig Start Date End Date Taking? Authorizing Provider  acetaminophen (TYLENOL) 500 MG tablet Take 1,000 mg by mouth every 4 (four) hours as needed for moderate pain.   Yes [provider]  amLODipine (NORVASC) 10 MG tablet TAKE 1 TABLET EVERY DAY ONCE A DAY ORALLY 90 03/13/18  Yes Elayne Snare, MD  aspirin EC 81 MG tablet Take 1 tablet (81 mg total) by mouth daily. Swallow whole. Start on 07/13/2020. 07/08/21  Yes de Sindy Messing, Erven Colla, MD  carvedilol (COREG) 25 MG tablet Take 37.5 mg by mouth 2 (two) times daily with a meal. 04/17/21 07/20/21 Yes [provider]  FARXIGA 5 MG TABS tablet TAKE 1 TABLET (5 MG TOTAL) BY MOUTH DAILY. 06/15/21  Yes Elayne Snare, MD  ibuprofen (ADVIL) 200 MG tablet Take 400 mg by mouth every 4 (four) hours as needed for moderate pain.   Yes [provider]  insulin aspart (FIASP) 100 UNIT/ML FlexTouch Pen Inject 8 Units into the skin 3 (three) times daily before meals. 07/09/21  Yes Elayne Snare, MD  losartan (COZAAR) 100 MG tablet Take 100 mg by mouth daily. 04/17/21   Yes [provider]  MELATONIN PO Take 1 capsule by mouth at bedtime as needed (sleep).   Yes [provider]  metFORMIN (GLUCOPHAGE) 1000 MG tablet Take 1,000 mg by mouth daily. 03/18/20  Yes [provider]  Multiple Vitamins-Calcium (ONE-A-DAY WOMENS PO) Take 1 tablet by mouth daily.   Yes [provider]  prednisoLONE acetate (PRED FORTE) 1 % ophthalmic suspension Place 1 drop into the left eye daily. 06/13/21  Yes [provider]  promethazine (PHENERGAN) 12.5 MG tablet Take 12.5 mg by mouth every 6 (six) hours as needed for nausea or vomiting.   Yes [provider]  rosuvastatin (CRESTOR) 40 MG tablet Take 40 mg by mouth daily. 04/27/21  Yes [provider]  Sodium Chloride, Hypertonic, (MURO 132 OP) Place 1 application into the left eye at bedtime.   Yes [provider]  SODIUM CHLORIDE, HYPERTONIC, OP Place 1 drop into the left eye daily as needed (dry eyes/irritation).   Yes [provider]  spironolactone (ALDACTONE) 25 MG tablet Take 1 tablet by mouth daily. 04/17/21 07/20/21 Yes [provider]  ticagrelor (BRILINTA) 90 MG TABS tablet Take 1 tablet (90 mg total) by mouth 2 (two) times daily. Start on 07/13/2021 07/08/21  Yes de Sindy Messing, Erven Colla, MD  Accu-Chek Softclix Lancets lancets Use to check blood sugar twice daily. 04/27/21   Elayne Snare, MD  benzonatate (TESSALON) 100 MG capsule Take 1 capsule (100 mg total) by mouth every 8 (eight)  hours. Patient not taking: Reported on 07/16/2021 07/02/21   Dorie Rank, MD  Continuous Blood Gluc Sensor (DEXCOM G6 SENSOR) MISC Use to monitor blood sugar, change after 10 days 03/30/21   Elayne Snare, MD  glucose blood (ACCU-CHEK GUIDE) test strip Use as instructed to check blood sugar twice daily. 04/27/21   Elayne Snare, MD  insulin glargine, 1 Unit Dial, (TOUJEO SOLOSTAR) 300 UNIT/ML Solostar Pen Adjust as directed 07/09/21   Elayne Snare, MD  Insulin  Syringe-Needle U-100 30G X 1/2" 0.3 ML MISC Use twice a day with insulin 10/17/17   Elayne Snare, MD  oxyCODONE-acetaminophen (PERCOCET/ROXICET) 5-325 MG tablet Take 1 tablet by mouth every 6 (six) hours as needed for up to 5 doses for severe pain. Patient not taking: Reported on 07/09/2021 04/25/21   Luna Fuse, MD  potassium chloride SA (KLOR-CON M20) 20 MEQ tablet Take 1 tablet (20 mEq total) by mouth daily. Patient not taking: Reported on 07/09/2021 03/30/21   Elayne Snare, MD     Vital Signs: BP (!) 158/79 (BP Location: Left Arm)    Pulse 73    Temp 98.7 F (37.1 C) (Oral)    Resp 18    Ht '5\' 6"'  (1.676 m)    Wt 231 lb (104.8 kg)    LMP 08/02/2013 Comment: spotting   SpO2 99%    BMI 37.28 kg/m   Physical Exam Vitals reviewed.  Constitutional:      General: She is not in acute distress.    Appearance: Normal appearance. She is not ill-appearing.  HENT:     Head: Normocephalic and atraumatic.  Pulmonary:     Effort: Pulmonary effort is normal.  Musculoskeletal:     Comments: Right hand/wrist selling   Skin:    Coloration: Skin is not jaundiced or pale.  Neurological:     Mental Status: She is alert.     Comments: Patient is A/O x 4 Moves all extremities freely, most weakness on left LE.     Psychiatric:        Behavior: Behavior normal.     Comments: States frustration due to double vision    Imaging: CT HEAD WO CONTRAST (5MM)  Result Date: 07/21/2021 CLINICAL DATA:  Stroke.  TNK follow-up EXAM: CT HEAD WITHOUT CONTRAST TECHNIQUE: Contiguous axial images were obtained from the base of the skull through the vertex without intravenous contrast. RADIATION DOSE REDUCTION: This exam was performed according to the departmental dose-optimization program which includes automated exposure control, adjustment of the mA and/or kV according to patient size and/or use of iterative reconstruction technique. COMPARISON:  MRI head 07/21/2021.  CT head 07/20/2021 FINDINGS: Brain: Negative for  acute hemorrhage. Ventricle size normal. Patchy white matter hypodensity bilaterally Small areas of acute infarct identified on diffusion-weighted imaging are not well delineated on CT. Vascular: Negative for hyperdense vessel. Prior coiling of the terminal left ICA aneurysm. Skull: Negative Sinuses/Orbits: Mild mucosal edema paranasal sinuses. No orbital lesion. Left cataract extraction. Other: None IMPRESSION: 1. No acute intracranial hemorrhage 2. Diffuse white matter hypodensity compatible with ischemia. Small areas of restricted diffusion best seen on MRI today. Electronically Signed   By: Franchot Gallo M.D.   On: 07/21/2021 13:40   MR BRAIN WO CONTRAST  Result Date: 07/21/2021 CLINICAL DATA:  61 year old female status post endovascular embolization of left ICA terminus aneurysm with web device. Neurologic deficit. EXAM: MRI HEAD WITHOUT CONTRAST TECHNIQUE: Multiplanar, multiecho pulse sequences of the brain and surrounding structures were obtained without intravenous contrast. COMPARISON:  CT head without contrast and CTA head and neck 07/20/2021. FINDINGS: Brain: There are scattered small - generally punctate - foci of diffusion restriction in both cerebral hemispheres. Most are in the left hemisphere involving the left MCA and left PCA territory. Among the most conspicuous is a 6 mm focus at the left external capsule abutting the lentiform (series 5, image 87 and series 7, image 70). Small involvement also at the right genu of the corpus callosum (right ACA territory). Occasional right MCA involvement. Subtle T2 and FLAIR hyperintensity associated with the acute findings. With moderate superimposed widely scattered background white matter T2 and FLAIR hyperintensity. And mild to moderate associated heterogeneity in the bilateral basal ganglia. But no cortical encephalomalacia identified. Trace if any chronic cerebral blood products. Mild susceptibility artifact at the left ICA terminus related to  endovascular embolization. No restricted diffusion to suggest acute infarction. No midline shift, mass effect, evidence of mass lesion, ventriculomegaly, extra-axial collection or acute intracranial hemorrhage. Vascular: Major intracranial vascular flow voids are preserved. Skull and upper cervical spine: Negative visible cervical spine. Visualized bone marrow signal is within normal limits. Sinuses/Orbits: Postoperative changes to the left globe. Otherwise negative orbits. Paranasal Visualized paranasal sinuses and mastoids are stable and well aerated. Other: Mastoids are well aerated.  Negative visible scalp and face. IMPRESSION: 1. 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 Among the most conspicuous is a 6 mm lacune in the left external capsule abutting the lentiform. No associated hemorrhage or mass effect. 2. Underlying moderate for age signal changes in the brain compatible with chronic small vessel disease. Electronically Signed   By: Genevie Ann M.D.   On: 07/21/2021 05:30   IR Transcath/Emboliz  Result Date: 07/20/2021 INDICATION: Shawniece Oyola Gonser is a 61 year old female with past medical history significant for diabetes, hyperlipidemia, hypertension and cataract. She presented to the emergency room with headache on 06/05/2021. 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 She underwent a diagnostic cerebral angiogram on 06/19/2021 that confirmed the presence of the 2 brain aneurysms seen on prior CTA. She comes to our service today for elective treatment of her left ICA aneurysm. In preparation to today's procedure, she was started on Brilinta 90 mg b.i.d. and aspirin 81 mg q.d. EXAM: ULTRASOUND-GUIDED VASCULAR ACCESS DIAGNOSTIC CEREBRAL ANGIOGRAM ENDOVASCULAR ANEURYSM EMBOLIZATION FLAT PANEL HEAD CT COMPARISON:  Cerebral angiogram June 18, 2021. MEDICATIONS: Ancef 2 g IV. The  antibiotic was administered within 1 hour of the procedure. ANESTHESIA/SEDATION: The study was performed in the general anesthesia. CONTRAST:  106 cc of Omnipaque 300 milligram/mL FLUOROSCOPY TIME:  Fluoroscopy Time: 42 minutes 48 seconds (1,843 mGy). COMPLICATIONS: Delayed - approximately one hour after procedure, patient developed left sided weakness, unclear whether procedure related given intervention in the left ICA. TECHNIQUE: Informed written consent was obtained from the patient after a thorough discussion of the procedural risks, benefits and alternatives. All questions were addressed. Maximal Sterile Barrier Technique was utilized including caps, mask, sterile gowns, sterile gloves, sterile drape, hand hygiene and skin antiseptic. A timeout was performed prior to the initiation of the procedure. Using the modified Seldinger technique and a micropuncture kit, access was gained to the distal right radial artery at the anatomical snuffbox and a 7 French sheath was placed. Real-time ultrasound guidance was utilized for vascular access including the acquisition of a permanent ultrasound image documenting patency of the accessed vessel.  Slow intra arterial infusion of 5,000 IU heparin, 5 mg Verapamil and 200 mcg nitroglycerin diluted in patient's own blood was performed. No significant fluctuation in patient's blood pressure seen. Then, a right radial artery angiogram was obtained via sheath side port. Next, a 5 [place] Simmons 2 glide catheter was navigated over a 0.035" Terumo Glidewire into the right subclavian artery under fluoroscopic guidance. The catheter was then advanced into the left common carotid artery. Frontal and lateral angiograms of the neck were obtained. Using biplane roadmap guidance, the catheter was then advanced into the left internal carotid artery. Frontal, lateral, magnified waters and magnified lateral views of the head were obtained. FINDINGS: 1. Normal brachial artery branching  pattern seen. No significant anatomical variation. The right radial artery caliber is adequate for vascular access. 2. 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 3. There is brisk vascular contrast filling of the bilateral ACA and left MCA vascular trees. The visualized dural sinuses are patent. PROCEDURE: The Simmons 2 glide catheter was exchanged over the wire and under biplane roadmap for a 7 [place] Rist catheter which was placed in the distal cervical segment of the left ICA. Magnified frontal and lateral angiograms of the head were obtained in the working projections. Then, a [place] EX intermediate catheter was navigated through the risk catheter and over a via 17 microcatheter and a synchro 2 micro guidewire into the cavernous segment of the right ICA. Magnified frontal and lateral angiograms of the head were obtained in the working projections. The via microcatheter was then navigated over the wire into the left ICA terminus aneurysm pouch. The wire was removed. Then, a 4 x 2 mm web SL device was deployed into the aneurysm pouch. Frontal and lateral magnified angiograms of the head were obtained in the working projections. Adequate positioning of the device was noted. The device was then detached under fluoroscopy. Follow-up left internal carotid artery angiograms with magnified frontal and lateral views of the head in the working projections showed stable position of the device within the aneurysm pouch. Left ICA angiograms with frontal and lateral views of the entire head were then obtained, showed no evidence of thromboembolic complication. Flat panel CT of the head was obtained and post processed in a separate workstation with concurrent attending physician supervision. Selected images were sent to PACS. No evidence of hemorrhagic complication noted. The catheter was subsequently withdrawn. An inflatable band was placed and inflated over the right hand  access site. The vascular sheath was withdrawn and the band was slowly deflated until brisk flow was noted through the arteriotomy site. At this point, the band was reinflated with additional 2 cc of air to obtain patent hemostasis. 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 No evidence of hemorrhagic of thromboembolic complication. PLAN: Patient will be admitted to ICU for observation. Electronically Signed   By: Pedro Earls M.D.   On: 07/20/2021 15:53   IR Angiogram Follow Up Study  Result Date: 07/20/2021 INDICATION: Tamu Golz Marshburn is a 61 year old female with past medical history significant for diabetes, hyperlipidemia, hypertension and cataract. She presented to the emergency room with headache on 06/05/2021. 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 She underwent a diagnostic cerebral angiogram on 06/19/2021 that confirmed the presence of the 2 brain aneurysms seen on prior CTA.  She comes to our service today for elective treatment of her left ICA aneurysm. In preparation to today's procedure, she was started on Brilinta 90 mg b.i.d. and aspirin 81 mg q.d. EXAM: ULTRASOUND-GUIDED VASCULAR ACCESS DIAGNOSTIC CEREBRAL ANGIOGRAM ENDOVASCULAR ANEURYSM EMBOLIZATION FLAT PANEL HEAD CT COMPARISON:  Cerebral angiogram June 18, 2021. MEDICATIONS: Ancef 2 g IV. The antibiotic was administered within 1 hour of the procedure. ANESTHESIA/SEDATION: The study was performed in the general anesthesia. CONTRAST:  106 cc of Omnipaque 300 milligram/mL FLUOROSCOPY TIME:  Fluoroscopy Time: 42 minutes 48 seconds (1,843 mGy). COMPLICATIONS: Delayed - approximately one hour after procedure, patient developed left sided weakness, unclear whether procedure related given intervention in the left ICA. TECHNIQUE: Informed written consent was obtained from the patient after a  thorough discussion of the procedural risks, benefits and alternatives. All questions were addressed. Maximal Sterile Barrier Technique was utilized including caps, mask, sterile gowns, sterile gloves, sterile drape, hand hygiene and skin antiseptic. A timeout was performed prior to the initiation of the procedure. Using the modified Seldinger technique and a micropuncture kit, access was gained to the distal right radial artery at the anatomical snuffbox and a 7 French sheath was placed. Real-time ultrasound guidance was utilized for vascular access including the acquisition of a permanent ultrasound image documenting patency of the accessed vessel. Slow intra arterial infusion of 5,000 IU heparin, 5 mg Verapamil and 200 mcg nitroglycerin diluted in patient's own blood was performed. No significant fluctuation in patient's blood pressure seen. Then, a right radial artery angiogram was obtained via sheath side port. Next, a 5 [place] Simmons 2 glide catheter was navigated over a 0.035" Terumo Glidewire into the right subclavian artery under fluoroscopic guidance. The catheter was then advanced into the left common carotid artery. Frontal and lateral angiograms of the neck were obtained. Using biplane roadmap guidance, the catheter was then advanced into the left internal carotid artery. Frontal, lateral, magnified waters and magnified lateral views of the head were obtained. FINDINGS: 1. Normal brachial artery branching pattern seen. No significant anatomical variation. The right radial artery caliber is adequate for vascular access. 2. 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 3. There is brisk vascular contrast filling of the bilateral ACA and left MCA vascular trees. The visualized dural sinuses are patent. PROCEDURE: The Simmons 2 glide catheter was exchanged over the wire and under biplane roadmap for a 7 [place] Rist catheter which was placed in the distal  cervical segment of the left ICA. Magnified frontal and lateral angiograms of the head were obtained in the working projections. Then, a [place] EX intermediate catheter was navigated through the risk catheter and over a via 17 microcatheter and a synchro 2 micro guidewire into the cavernous segment of the right ICA. Magnified frontal and lateral angiograms of the head were obtained in the working projections. The via microcatheter was then navigated over the wire into the left ICA terminus aneurysm pouch. The wire was removed. Then, a 4 x 2 mm web SL device was deployed into the aneurysm pouch. Frontal and lateral magnified angiograms of the head were obtained in the working projections. Adequate positioning of the device was noted. The device was then detached under fluoroscopy. Follow-up left internal carotid artery angiograms with magnified frontal and lateral views of the head in the working projections showed stable position of the device within the aneurysm pouch. Left ICA angiograms with frontal and lateral views of the entire head were then  obtained, showed no evidence of thromboembolic complication. Flat panel CT of the head was obtained and post processed in a separate workstation with concurrent attending physician supervision. Selected images were sent to PACS. No evidence of hemorrhagic complication noted. The catheter was subsequently withdrawn. An inflatable band was placed and inflated over the right hand access site. The vascular sheath was withdrawn and the band was slowly deflated until brisk flow was noted through the arteriotomy site. At this point, the band was reinflated with additional 2 cc of air to obtain patent hemostasis. 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 No evidence of hemorrhagic of thromboembolic complication. PLAN: Patient will be admitted to ICU for observation. Electronically Signed   By: Pedro Earls  M.D.   On: 07/20/2021 15:53   IR CT Head Ltd  Result Date: 07/20/2021 INDICATION: Shavonn Convey Mack is a 61 year old female with past medical history significant for diabetes, hyperlipidemia, hypertension and cataract. She presented to the emergency room with headache on 06/05/2021. 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 She underwent a diagnostic cerebral angiogram on 06/19/2021 that confirmed the presence of the 2 brain aneurysms seen on prior CTA. She comes to our service today for elective treatment of her left ICA aneurysm. In preparation to today's procedure, she was started on Brilinta 90 mg b.i.d. and aspirin 81 mg q.d. EXAM: ULTRASOUND-GUIDED VASCULAR ACCESS DIAGNOSTIC CEREBRAL ANGIOGRAM ENDOVASCULAR ANEURYSM EMBOLIZATION FLAT PANEL HEAD CT COMPARISON:  Cerebral angiogram June 18, 2021. MEDICATIONS: Ancef 2 g IV. The antibiotic was administered within 1 hour of the procedure. ANESTHESIA/SEDATION: The study was performed in the general anesthesia. CONTRAST:  106 cc of Omnipaque 300 milligram/mL FLUOROSCOPY TIME:  Fluoroscopy Time: 42 minutes 48 seconds (1,843 mGy). COMPLICATIONS: Delayed - approximately one hour after procedure, patient developed left sided weakness, unclear whether procedure related given intervention in the left ICA. TECHNIQUE: Informed written consent was obtained from the patient after a thorough discussion of the procedural risks, benefits and alternatives. All questions were addressed. Maximal Sterile Barrier Technique was utilized including caps, mask, sterile gowns, sterile gloves, sterile drape, hand hygiene and skin antiseptic. A timeout was performed prior to the initiation of the procedure. Using the modified Seldinger technique and a micropuncture kit, access was gained to the distal right radial artery at the anatomical snuffbox and a 7 French sheath was placed. Real-time ultrasound  guidance was utilized for vascular access including the acquisition of a permanent ultrasound image documenting patency of the accessed vessel. Slow intra arterial infusion of 5,000 IU heparin, 5 mg Verapamil and 200 mcg nitroglycerin diluted in patient's own blood was performed. No significant fluctuation in patient's blood pressure seen. Then, a right radial artery angiogram was obtained via sheath side port. Next, a 5 [place] Simmons 2 glide catheter was navigated over a 0.035" Terumo Glidewire into the right subclavian artery under fluoroscopic guidance. The catheter was then advanced into the left common carotid artery. Frontal and lateral angiograms of the neck were obtained. Using biplane roadmap guidance, the catheter was then advanced into the left internal carotid artery. Frontal, lateral, magnified waters and magnified lateral views of the head were obtained. FINDINGS: 1. Normal brachial artery branching pattern seen. No significant anatomical variation. The right radial artery caliber is adequate for vascular access. 2. There is a 3.1 X 2.7 mm saccular aneurysm projecting superiorly from the  left ICA terminus. 3. There is brisk vascular contrast filling of the bilateral ACA and left MCA vascular trees. The visualized dural sinuses are patent. PROCEDURE: The Simmons 2 glide catheter was exchanged over the wire and under biplane roadmap for a 7 [place] Rist catheter which was placed in the distal cervical segment of the left ICA. Magnified frontal and lateral angiograms of the head were obtained in the working projections. Then, a [place] EX intermediate catheter was navigated through the risk catheter and over a via 17 microcatheter and a synchro 2 micro guidewire into the cavernous segment of the right ICA. Magnified frontal and lateral angiograms of the head were obtained in the working projections. The via microcatheter was then navigated over the wire into the left ICA terminus aneurysm pouch. The wire  was removed. Then, a 4 x 2 mm web SL device was deployed into the aneurysm pouch. Frontal and lateral magnified angiograms of the head were obtained in the working projections. Adequate positioning of the device was noted. The device was then detached under fluoroscopy. Follow-up left internal carotid artery angiograms with magnified frontal and lateral views of the head in the working projections showed stable position of the device within the aneurysm pouch. Left ICA angiograms with frontal and lateral views of the entire head were then obtained, showed no evidence of thromboembolic complication. Flat panel CT of the head was obtained and post processed in a separate workstation with concurrent attending physician supervision. Selected images were sent to PACS. No evidence of hemorrhagic complication noted. The catheter was subsequently withdrawn. An inflatable band was placed and inflated over the right hand access site. The vascular sheath was withdrawn and the band was slowly deflated until brisk flow was noted through the arteriotomy site. At this point, the band was reinflated with additional 2 cc of air to obtain patent hemostasis. 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 No evidence of hemorrhagic of thromboembolic complication. PLAN: Patient will be admitted to ICU for observation. Electronically Signed   By: Pedro Earls M.D.   On: 07/20/2021 15:53   ECHOCARDIOGRAM COMPLETE  Result Date: 07/20/2021    ECHOCARDIOGRAM REPORT   Patient Name:   JUN RIGHTMYER Date of Exam: 07/20/2021 Medical Rec #:  [id number]        Height:       66.0 in Accession #:    [id number]       Weight:       231.0 lb Date of Birth:  12/02/1960        BSA:          2.126 m Patient Age:    1 years         BP:           138/80 mmHg Patient Gender: F                HR:           66 bpm. Exam Location:  Inpatient Procedure: 2D Echo Indications:    Stroke   History:        Patient has prior history of Echocardiogram examinations, most                 recent 10/30/2020. Abnormal ECG; Risk Factors:Diabetes,                 Hypertension and Dyslipidemia.  Sonographer:    Arlyss Gandy Referring Phys: [id number] [place]  1. Left ventricular ejection fraction, by estimation, is 65 to 70%. Left ventricular ejection fraction by PLAX is 66 %. The left ventricle has normal function. The left ventricle has no regional wall motion abnormalities. There is moderate asymmetric left ventricular hypertrophy of the basal-septal segment. Left ventricular diastolic parameters are consistent with Grade I diastolic dysfunction (impaired relaxation).  2. Right ventricular systolic function is normal. The right ventricular size is normal.  3. The mitral valve is grossly normal. Trivial mitral valve regurgitation.  4. The aortic valve is tricuspid. Aortic valve regurgitation is not visualized. Aortic valve sclerosis/calcification is present, without any evidence of aortic stenosis.  5. The inferior vena cava is normal in size with greater than 50% respiratory variability, suggesting right atrial pressure of 3 mmHg. Comparison(s): No prior Echocardiogram. 10/30/2020: LVEF 70-75%. FINDINGS  Left Ventricle: Left ventricular ejection fraction, by estimation, is 65 to 70%. Left ventricular ejection fraction by PLAX is 66 %. The left ventricle has normal function. The left ventricle has no regional wall motion abnormalities. The left ventricular internal cavity size was normal in size. There is moderate asymmetric left ventricular hypertrophy of the basal-septal segment. Left ventricular diastolic parameters are consistent with Grade I diastolic dysfunction (impaired relaxation). Indeterminate filling pressures. Right Ventricle: The right ventricular size is normal. No increase in right ventricular wall thickness. Right ventricular systolic function is normal. Left Atrium: Left  atrial size was normal in size. Right Atrium: Right atrial size was normal in size. Pericardium: There is no evidence of pericardial effusion. Mitral Valve: The mitral valve is grossly normal. Trivial mitral valve regurgitation. Tricuspid Valve: The tricuspid valve is grossly normal. Tricuspid valve regurgitation is trivial. Aortic Valve: The aortic valve is tricuspid. Aortic valve regurgitation is not visualized. Aortic valve sclerosis/calcification is present, without any evidence of aortic stenosis. Aortic valve mean gradient measures 7.0 mmHg. Aortic valve peak gradient measures 9.7 mmHg. Aortic valve area, by VTI measures 2.70 cm. Pulmonic Valve: The pulmonic valve was grossly normal. Pulmonic valve regurgitation is trivial. Aorta: The aortic root and ascending aorta are structurally normal, with no evidence of dilitation. Venous: The inferior vena cava is normal in size with greater than 50% respiratory variability, suggesting right atrial pressure of 3 mmHg. IAS/Shunts: No atrial level shunt detected by color flow Doppler.  LEFT VENTRICLE PLAX 2D LV EF:         Left            Diastology                ventricular     LV e' medial:    5.87 cm/s                ejection        LV E/e' medial:  12.0                fraction by     LV e' lateral:   8.70 cm/s                PLAX is 66      LV E/e' lateral: 8.1                %. LVIDd:         3.87 cm LVIDs:         2.49 cm LV PW:         1.22 cm LV IVS:        1.55 cm LVOT diam:     2.00  cm LV SV:         108 LV SV Index:   51 LVOT Area:     3.14 cm  RIGHT VENTRICLE RV S prime:     16.80 cm/s TAPSE (M-mode): 2.3 cm LEFT ATRIUM             Index LA diam:        2.70 cm 1.27 cm/m LA Vol (A2C):   55.4 ml 26.06 ml/m LA Vol (A4C):   34.1 ml 16.04 ml/m LA Biplane Vol: 44.3 ml 20.84 ml/m  AORTIC VALVE AV Area (Vmax):    2.94 cm AV Area (Vmean):   2.56 cm AV Area (VTI):     2.70 cm AV Vmax:           156.00 cm/s AV Vmean:          125.000 cm/s AV VTI:             0.399 m AV Peak Grad:      9.7 mmHg AV Mean Grad:      7.0 mmHg LVOT Vmax:         146.00 cm/s LVOT Vmean:        102.000 cm/s LVOT VTI:          0.343 m LVOT/AV VTI ratio: 0.86  AORTA Ao Root diam: 3.20 cm Ao Asc diam:  3.30 cm MITRAL VALVE MV Area (PHT): 2.12 cm     SHUNTS MV Decel Time: 357 msec     Systemic VTI:  0.34 m MV E velocity: 70.30 cm/s   Systemic Diam: 2.00 cm MV A velocity: 103.00 cm/s MV E/A ratio:  0.68 Lyman Bishop MD Electronically signed by Lyman Bishop MD Signature Date/Time: 07/20/2021/3:41:45 PM    Final    CT HEAD CODE STROKE WO CONTRAST`  Result Date: 07/20/2021 CLINICAL DATA:  Code stroke. EXAM: CT HEAD WITHOUT CONTRAST TECHNIQUE: Contiguous axial images were obtained from the base of the skull through the vertex without intravenous contrast. RADIATION DOSE REDUCTION: This exam was performed according to the departmental dose-optimization program which includes automated exposure control, adjustment of the mA and/or kV according to patient size and/or use of iterative reconstruction technique. COMPARISON:  06/08/2021 FINDINGS: Brain: No evidence of acute infarction, hemorrhage, cerebral edema, mass, mass effect, or midline shift. Ventricles and sulci are normal for age. No extra-axial fluid collection. Periventricular white matter changes, likely the sequela of chronic small vessel ischemic disease. Vascular: No hyperdense vessel or unexpected calcification. Skull: Normal. Negative for fracture or focal lesion. Sinuses/Orbits: Mucosal thickening in the maxillary sinuses. Status post left lens replacement. Other: The mastoid air cells are well aerated. ASPECTS [facility] Stroke Program Early CT Score) - Ganglionic level infarction (caudate, lentiform nuclei, internal capsule, insula, M1-M3 cortex): 7 - Supraganglionic infarction (M4-M6 cortex): 3 Total score (0-10 with 10 being normal): 10 IMPRESSION: 1. No acute intracranial process. 2. ASPECTS is 10 Code stroke imaging results were  communicated on 07/20/2021 at 11:56 am to provider Dr. Lorrin Goodell via secure text paging. Electronically Signed   By: Merilyn Baba M.D.   On: 07/20/2021 11:57   CT ANGIO HEAD NECK W WO CM (CODE STROKE)  Result Date: 07/20/2021 CLINICAL DATA:  Neuro deficit, stroke suspected EXAM: CT ANGIOGRAPHY HEAD AND NECK TECHNIQUE: Multidetector CT imaging of the head and neck was performed using the standard protocol during bolus administration of intravenous contrast. Multiplanar CT image reconstructions and MIPs were obtained to evaluate the vascular anatomy. Carotid stenosis measurements (when applicable)  are obtained utilizing NASCET criteria, using the distal internal carotid diameter as the denominator. RADIATION DOSE REDUCTION: This exam was performed according to the departmental dose-optimization program which includes automated exposure control, adjustment of the mA and/or kV according to patient size and/or use of iterative reconstruction technique. CONTRAST:  25m OMNIPAQUE IOHEXOL 350 MG/ML SOLN COMPARISON:  06/05/2021 CTA head, correlation is also made with same day CT head FINDINGS: CT HEAD FINDINGS For noncontrast findings, please see same day CT head. CTA NECK FINDINGS Aortic arch: Two-vessel arch with a common origin of the brachiocephalic and left common carotid arteries. Imaged portion shows no evidence of aneurysm or dissection. No significant stenosis of the major arch vessel origins. Right carotid system: No evidence of dissection, stenosis (50% or greater) or occlusion. Calcified and noncalcified plaque at the bifurcation, which is not hemodynamically significant. Left carotid system: No evidence of dissection, stenosis (50% or greater) or occlusion. Calcified and noncalcified plaque at the bifurcation which is not hemodynamically significant. Vertebral arteries: Codominant. No evidence of dissection, stenosis (50% or greater) or occlusion. Skeleton: No acute osseous abnormality. Other neck:  Negative. Upper chest: No focal pulmonary opacity or pleural effusion. Review of the MIP images confirms the above findings CTA HEAD FINDINGS Anterior circulation: Both internal carotid arteries are patent to the termini. Unchanged 3 mm supero medially projecting aneurysm arising from the distal cavernous right ICA (series 7, image 244). Interval coiling of the previously noted superiorly projecting aneurysm arising from the left ICA terminus at the origin of the left ACA and MCA (series 7, image 224). A1 segments patent, with redemonstrated hypoplastic right A1. Normal anterior communicating artery. Anterior cerebral arteries are patent to their distal aspects. No M1 stenosis or occlusion. Normal MCA bifurcations. Distal MCA branches perfused and symmetric. Posterior circulation: Vertebral arteries patent to the vertebrobasilar junction without stenosis. Posterior inferior cerebral arteries patent bilaterally. Basilar patent to its distal aspect. Superior cerebellar arteries patent bilaterally. Bilateral P1 segments originate from the basilar artery. PCAs perfused to their distal aspects without stenosis. The bilateral posterior communicating arteries are patent. Venous sinuses: As permitted by contrast timing, patent. Anatomic variants: None significant Review of the MIP images confirms the above findings IMPRESSION: 1. 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 2.  No intracranial large vessel occlusion or significant stenosis. 3.  No hemodynamically significant stenosis in the neck. 4. Unchanged 3 mm aneurysm projecting from the distal cavernous right ICA. Code stroke imaging results were communicated on 07/20/2021 at 12:39 pm to provider Dr. KLorrin Goodellvia secure text paging. Electronically Signed   By: AMerilyn BabaM.D.   On: 07/20/2021 12:40   IR ANGIO INTRA EXTRACRAN SEL INTERNAL CAROTID UNI R MOD SED  Result Date: 07/20/2021 INDICATION: BChauncey BrunoBarclift is a 61year old female with  past medical history significant for diabetes, hyperlipidemia, hypertension and cataract. She presented to the emergency room with headache on 06/05/2021. 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 She underwent a diagnostic cerebral angiogram on 06/19/2021 that confirmed the presence of the 2 brain aneurysms seen on prior CTA. She comes to our service today for elective treatment of her left ICA aneurysm. In preparation to today's procedure, she was started on Brilinta 90 mg b.i.d. and aspirin 81 mg q.d. EXAM: ULTRASOUND-GUIDED VASCULAR ACCESS DIAGNOSTIC CEREBRAL ANGIOGRAM ENDOVASCULAR ANEURYSM EMBOLIZATION FLAT PANEL HEAD CT COMPARISON:  Cerebral angiogram June 18, 2021. MEDICATIONS: Ancef  2 g IV. The antibiotic was administered within 1 hour of the procedure. ANESTHESIA/SEDATION: The study was performed in the general anesthesia. CONTRAST:  106 cc of Omnipaque 300 milligram/mL FLUOROSCOPY TIME:  Fluoroscopy Time: 42 minutes 48 seconds (1,843 mGy). COMPLICATIONS: Delayed - approximately one hour after procedure, patient developed left sided weakness, unclear whether procedure related given intervention in the left ICA. TECHNIQUE: Informed written consent was obtained from the patient after a thorough discussion of the procedural risks, benefits and alternatives. All questions were addressed. Maximal Sterile Barrier Technique was utilized including caps, mask, sterile gowns, sterile gloves, sterile drape, hand hygiene and skin antiseptic. A timeout was performed prior to the initiation of the procedure. Using the modified Seldinger technique and a micropuncture kit, access was gained to the distal right radial artery at the anatomical snuffbox and a 7 French sheath was placed. Real-time ultrasound guidance was utilized for vascular access including the acquisition of a permanent ultrasound image documenting patency of the accessed  vessel. Slow intra arterial infusion of 5,000 IU heparin, 5 mg Verapamil and 200 mcg nitroglycerin diluted in patient's own blood was performed. No significant fluctuation in patient's blood pressure seen. Then, a right radial artery angiogram was obtained via sheath side port. Next, a 5 [place] Simmons 2 glide catheter was navigated over a 0.035" Terumo Glidewire into the right subclavian artery under fluoroscopic guidance. The catheter was then advanced into the left common carotid artery. Frontal and lateral angiograms of the neck were obtained. Using biplane roadmap guidance, the catheter was then advanced into the left internal carotid artery. Frontal, lateral, magnified waters and magnified lateral views of the head were obtained. FINDINGS: 1. Normal brachial artery branching pattern seen. No significant anatomical variation. The right radial artery caliber is adequate for vascular access. 2. 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 3. There is brisk vascular contrast filling of the bilateral ACA and left MCA vascular trees. The visualized dural sinuses are patent. PROCEDURE: The Simmons 2 glide catheter was exchanged over the wire and under biplane roadmap for a 7 [place] Rist catheter which was placed in the distal cervical segment of the left ICA. Magnified frontal and lateral angiograms of the head were obtained in the working projections. Then, a [place] EX intermediate catheter was navigated through the risk catheter and over a via 17 microcatheter and a synchro 2 micro guidewire into the cavernous segment of the right ICA. Magnified frontal and lateral angiograms of the head were obtained in the working projections. The via microcatheter was then navigated over the wire into the left ICA terminus aneurysm pouch. The wire was removed. Then, a 4 x 2 mm web SL device was deployed into the aneurysm pouch. Frontal and lateral magnified angiograms of the head  were obtained in the working projections. Adequate positioning of the device was noted. The device was then detached under fluoroscopy. Follow-up left internal carotid artery angiograms with magnified frontal and lateral views of the head in the working projections showed stable position of the device within the aneurysm pouch. Left ICA angiograms with frontal and lateral views of the entire head were then obtained, showed no evidence of thromboembolic complication. Flat panel CT of the head was obtained and post processed in a separate workstation with concurrent attending physician supervision. Selected images were sent to PACS. No evidence of hemorrhagic complication noted. The catheter was subsequently withdrawn. An inflatable band was placed and inflated over the right hand  access site. The vascular sheath was withdrawn and the band was slowly deflated until brisk flow was noted through the arteriotomy site. At this point, the band was reinflated with additional 2 cc of air to obtain patent hemostasis. 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 No evidence of hemorrhagic of thromboembolic complication. PLAN: Patient will be admitted to ICU for observation. Electronically Signed   By: Pedro Earls M.D.   On: 07/20/2021 15:53    Labs:  CBC: Recent Labs    06/05/21 0720 06/08/21 0829 06/18/21 0950 07/20/21 0642  WBC 5.0 5.5 5.2 6.0  HGB 11.4* 12.7 12.4 11.9*  HCT 35.7* 40.7 38.8 36.5  PLT 196 231 251 252    COAGS: Recent Labs    06/18/21 0950 07/20/21 0642 07/20/21 1234  INR 1.1 0.9 1.1  APTT  --   --  152*    BMP: Recent Labs    06/05/21 0720 06/08/21 0829 06/18/21 0950 07/07/21 1346 07/20/21 0642  NA 139 137 139 140 139  K 3.8 3.8 3.9 4.0 4.1  CL 105 105 106 106 103  CO2 '26 25 27 25 25  ' GLUCOSE 144* 160* 136* 211* 210*  BUN '17 19 14 22 20  ' CALCIUM 9.7 9.3 9.6 9.5 9.7  CREATININE 0.82 0.82 0.88 0.83  1.00  GFRNONAA >60 >60 >60  --  >60    LIVER FUNCTION TESTS: Recent Labs    09/05/20 1522 06/08/21 0829 07/07/21 1346  BILITOT 0.4 0.4 0.4  AST '21 19 19  ' ALT '20 19 23  ' ALKPHOS 80 49 39  PROT 7.6 7.6 7.6  ALBUMIN 4.0 3.9 4.0    Assessment and Plan:  61 y.o. 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 The procedure was uneventful and patient was able to move all 4 exts after extubation.  Approximally 1-1.5 hrs after extubation, patient suddenly developed left UE/LE weaknesses, Code Stroke was called.  CTA H/N was negative for acute intracranial process, MR brain showed:  Scattered, mostly punctate, small acute infarcts in the cerebral hemispheres left > right. Among the most conspicuous is a 6 mm lacune in the left external capsule abutting the lentiform. No associated hemorrhage or mass effect. 2.   Underlying moderate for age signal changes in the brain        compatible with chronic small vessel disease.  Patient continued to show improvement in left UE/LE weakness.  Awaiting insurance authorization/ bed placement for inpatient rehab.  Pt reported blurry vision/double vision this morning, Dr. Leonie Man notified via secure chat.   Patient to be scheduled for [facility] in 6 months to follow up the treated Lt ICA aneurysm and also to evaluate other cerebral aneurysms found on diagnostic cerebral angiogram on 06/18/21. - MRA Brain W WO ordered.  Continue to take ASA 81 mg.   Further treatment plan per Stroke team.  Appreciate and agree with the plan.  Please call [facility] for questions and concerns.    Electronically Signed: Tera Mater, PA-C 07/23/2021, 10:19 AM   I spent a total of 25 Minutes at the the patient's bedside AND on the patient's hospital floor or unit, greater than 50% of which was counseling/coordinating care for Lt ICA aneurysm tx with [facility] device.   This chart was dictated using voice recognition software.  Despite  best efforts to proofread,  errors can occur which can change the documentation meaning.

## 2021-07-23 NOTE — Progress Notes (Signed)
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 Post procedure, pt wit left side decreased sensation. 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 PMH: DM2, HTN, HLD, obesity.    PT Comments    Pt received in supine, agreeable to therapy session with encouragement and with good participation and tolerance for hallway gait progression with RW and HHA. Pt needing up to modA (+2 for safety) during gait trial with LUE HHA and up to minA for stepping up/down steps x2 with bilateral handrails. Pt remains increased fall risk with very slow gait speed and decreased RUE/LLE strength. Pt continues to benefit from PT services to progress toward functional mobility goals.   Recommendations for follow up therapy are one component of a multi-disciplinary discharge planning process, led by the attending physician.  Recommendations may be updated based on patient status, additional functional criteria and insurance authorization.  Follow Up Recommendations  Acute inpatient rehab (3hours/day)     Assistance Recommended at Discharge Frequent or constant Supervision/Assistance  Patient can return home with the following A little help with bathing/dressing/bathroom;Assistance with cooking/housework;Assist for transportation;A little help with walking and/or transfers   Equipment Recommendations  Rolling walker (2 wheels) (may progress to [facility] pending progress acutely)    Recommendations for Other Services Rehab consult     Precautions / Restrictions Precautions Precautions: Fall Precaution Comments: intermittent LLE buckling Restrictions Weight Bearing Restrictions: No     Mobility  Bed Mobility Overal bed mobility: Needs Assistance Bed Mobility: Supine to  Sit     Supine to sit: Supervision, HOB elevated     General bed mobility comments: use of bed features, increased time to perform    Transfers Overall transfer level: Needs assistance Equipment used: Rolling walker (2 wheels) Transfers: Sit to/from Stand Sit to Stand: Min guard           General transfer comment: light rise and steady assist, STS x4 during session x1 from EOB x3 from chair in rehab gym    Ambulation/Gait Ambulation/Gait assistance: Min guard, Mod assist Gait Distance (Feet): 100 Feet (x2 to/from rehab gym and seated break between trials) Assistive device: Rolling walker (2 wheels), 1 person hand held assist Gait Pattern/deviations: Decreased weight shift to left, Decreased stance time - left, Decreased step length - left, Decreased dorsiflexion - left, Drifts right/left Gait velocity: grossly <0.4 m/s; variable 0.1 to 0.3 m/s depending on AD used Gait velocity interpretation: <1.31 ft/sec, indicative of household ambulator   General Gait Details: with HHA on LUE, pt needing up to modA (~66ft trial), otherwise min guard assist with RW with very slow gait speed, indicating increased fall risk and tendency to veer toward L side of hallway, of note RW noted to pull slightly to left, when RW switched out pt able to maintain better path in hallway but needing reminder to maintain middle pathway in hall. No buckling with RW but near-buckling multiple times with LUE HHA and increased postural sway.   Stairs Stairs: Yes Stairs assistance: Min assist Stair Management: One rail Right, One rail Left, Forwards, Step to pattern Number of Stairs: 2 General stair comments: cues for step sequencing with no carryover from previous session; pt hesitant but with proper sequencing, no overt buckling, LLE guarded for safety each step-up and down during stance phase   Wheelchair Mobility  Modified Rankin (Stroke Patients Only) Modified Rankin (Stroke Patients  Only) Pre-Morbid Rankin Score: No symptoms Modified Rankin: Moderately severe disability     Balance Overall balance assessment: Needs assistance Sitting-balance support: No upper extremity supported, Feet supported Sitting balance-Leahy Scale: Good     Standing balance support: Bilateral upper extremity supported, During functional activity Standing balance-Leahy Scale: Poor Standing balance comment: needed UE and external support when only given U UE support; with BUE support, just min guard for safety                            Cognition Arousal/Alertness: Awake/alert Behavior During Therapy: Flat affect Overall Cognitive Status: Impaired/Different from baseline                                 General Comments: aware of deficits, pt self-directed and eager to progress mobility with fair safety awareness.        Exercises Other Exercises Other Exercises: seated LLE AROM: hip flexion, LAQ x10 reps ea, encouraged her to continue TID while EOB/in recliner    General Comments General comments (skin integrity, edema, etc.): HR 76 bpm      Pertinent Vitals/Pain Pain Assessment Pain Assessment: Faces Faces Pain Scale: Hurts little more Pain Location: R hand (+ swelling and rubor post-procedure), painful with deep pressure Pain Descriptors / Indicators: Sore, Discomfort Pain Intervention(s): Monitored during session, Repositioned, Limited activity within patient's tolerance (encouraged RUE elevation on folded blankets or pillows)    Home Living                          Prior Function            PT Goals (current goals can now be found in the care plan section) Acute Rehab PT Goals Patient Stated Goal: return home PT Goal Formulation: With patient Time For Goal Achievement: 08/04/21 Progress towards PT goals: Progressing toward goals    Frequency    Min 4X/week      PT Plan Current plan remains appropriate     Co-evaluation PT/OT/SLP Co-Evaluation/Treatment: Yes Reason for Co-Treatment: For patient/therapist safety;To address functional/ADL transfers PT goals addressed during session: Mobility/safety with mobility;Balance;Proper use of DME;Strengthening/ROM        AM-PAC PT "6 Clicks" Mobility   Outcome Measure  Help needed turning from your back to your side while in a flat bed without using bedrails?: A Little Help needed moving from lying on your back to sitting on the side of a flat bed without using bedrails?: A Little Help needed moving to and from a bed to a chair (including a wheelchair)?: A Little Help needed standing up from a chair using your arms (e.g., wheelchair or bedside chair)?: A Little Help needed to walk in hospital room?: A Lot (with HHA "a lot", mod cues with RW) Help needed climbing 3-5 steps with a railing? : A Lot (mod cues) 6 Click Score: 16    End of Session Equipment Utilized During Treatment: Gait belt Activity Tolerance: Patient tolerated treatment well Patient left: in bed;with call bell/phone within reach;with family/visitor present;Other (comment) (seated EOB, family member in her chair) Nurse Communication: Mobility status;Other (comment) (pt seated EOB to eat lunch, delayed note -needs bed alarm set (noted after PTA left room, RN/NT notified via securechat)) PT Visit Diagnosis: Unsteadiness on feet (R26.81);Hemiplegia and hemiparesis;Difficulty in walking, not  elsewhere classified (R26.2) Hemiplegia - Right/Left: Left Hemiplegia - dominant/non-dominant: Non-dominant Hemiplegia - caused by: Nontraumatic intracerebral hemorrhage     Time: 2035-5974 PT Time Calculation (min) (ACUTE ONLY): 23 min  Charges:  $Gait Training: 8-22 mins                     Yousif Edelson P., PTA Acute Rehabilitation Services Pager: [phone] Office: [place] 07/23/2021, 12:23 PM

## 2021-07-23 NOTE — Progress Notes (Signed)
Inpatient Diabetes Program Recommendations  AACE/ADA: New Consensus Statement on Inpatient Glycemic Control (2015)  Target Ranges:  Prepandial:   less than 140 mg/dL      Peak postprandial:   less than 180 mg/dL (1-2 hours)      Critically ill patients:  140 - 180 mg/dL   Lab Results  Component Value Date   GLUCAP 237 (H) 07/23/2021   HGBA1C 8.4 (H) 07/21/2021    Review of Glycemic Control  Diabetes history: type 2 Outpatient Diabetes medications: 70/30 insulin 16 units with supper, Fiasp up to 8 units SS with breakfast and supper, Farxiga 5 mg daily, Metformin 1000 mg daily (not taking Toujeo) Current orders for Inpatient glycemic control: Novolog SENSITIVE correction scale TID & HS scale  Inpatient Diabetes Program Recommendations:   Spoke with patient at the bedside. Reviewed diabetes medications with her. States that she had a prescription for Toujeo, but has not started taking it. Could not remember the dosage that was prescribed. Sees endocrinologist , Dr. Dwyane Dee and had seen him about 2 weeks ago. Discussed hgbA1C of 8.4%. states that it had been 11% last year.   States that she lives with her husband. States that she does a lot of the cooking at home. Has home blood glucose meter and strips and checks blood sugars 2-3 times per day.   Will continue to monitor blood sugars while in the hospital.  Harvel Ricks RN BSN CDE Diabetes Coordinator Pager: [phone]  8am-5pm

## 2021-07-23 NOTE — H&P (Signed)
Physical Medicine and Rehabilitation Admission H&P     HPI: Shannon Oliver is a 61 year old right-handed female with history of diabetes mellitus, hypertension, hyperlipidemia, obesity with BMI 37.28, OSA, question medical compliance.  Per chart review patient lives with spouse.  1 level home ramped entrance.  Independent prior to admission working as a Training and development officer at [employer].  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  Post procedure left side decreased sensation as well as left-sided weakness.  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  No associated hemorrhage or mass-effect.  CT angiogram head and neck no intracranial large vessel occlusion or significant stenosis.  Echocardiogram with ejection fraction of 65 to 70% no wall motion abnormalities grade 1 diastolic dysfunction.  Patient is currently maintained on low-dose aspirin and no plan for Brilinta.  She did initially need Cleviprex for blood pressure control.  Therapy evaluations completed due to patient decreased functional mobility left side weakness was admitted for a comprehensive rehab program   Pt reports LBM this AM/voiding well.  Had abd pain prior to BM, but gone now.  Trouble filling out paperwork with her name this AM- problems with dexterity of R hand, trouble thinking as well as vision blurry.  Also having intermittent HA's- are rare for her until they helped her dx aneurysm- scared it means will have another one.   Upset- thought stroke Sx's would completely resolve with "the medicine they gave me".  Upset she's not back to normal, per pt.  Review of Systems  Constitutional:  Negative for chills and fever.  HENT:  Negative for  hearing loss.   Eyes:  Negative for blurred vision and double vision.  Respiratory:  Negative for cough and shortness of breath.   Cardiovascular:  Negative for chest pain, palpitations and leg swelling.  Gastrointestinal:  Positive for constipation. Negative for heartburn, nausea and vomiting.       GERD  Genitourinary:  Negative for dysuria, flank pain and hematuria.  Musculoskeletal:  Positive for joint pain and myalgias.  Skin:  Negative for rash.  Neurological:  Positive for sensory change and weakness.  Psychiatric/Behavioral:  Positive for depression.   All other systems reviewed and are negative. Past Medical History:  Diagnosis Date   Abnormal EKG    LVH with strain   Acid reflux    Asthma    Depression    Diabetes mellitus    A1C over 9   HLD (hyperlipidemia)    Hypertension    Noncompliance    Obesity    Pneumonia    Sleep apnea    Past Surgical History:  Procedure Laterality Date   CARDIAC CATHETERIZATION     CATARACT EXTRACTION Left 06/04/2021   COLONOSCOPY WITH PROPOFOL N/A 04/29/2015   Procedure: COLONOSCOPY WITH PROPOFOL;  Surgeon: Garlan Fair, MD;  Location: [facility];  Service: Endoscopy;  Laterality: N/A;   EYE SURGERY     HYSTEROSCOPY WITH D & C N/A 09/07/2017   Procedure: DILATATION AND CURETTAGE /HYSTEROSCOPY;  Surgeon: Sloan Leiter, MD;  Location: [place];  Service: Gynecology;  Laterality: N/A;   IR ANGIO INTRA EXTRACRAN SEL COM CAROTID INNOMINATE UNI R MOD SED  06/18/2021   IR ANGIO INTRA EXTRACRAN SEL INTERNAL CAROTID UNI L MOD  SED  06/18/2021   IR ANGIO INTRA EXTRACRAN SEL INTERNAL CAROTID UNI R MOD SED  07/20/2021   IR ANGIO VERTEBRAL SEL VERTEBRAL UNI R MOD SED  06/18/2021   IR ANGIOGRAM FOLLOW UP STUDY  07/20/2021   IR CT HEAD LTD  07/20/2021   IR RADIOLOGIST EVAL & MGMT  06/19/2021   IR TRANSCATH/EMBOLIZ  07/20/2021   IR US GUIDE VASC ACCESS RIGHT  06/18/2021   KNEE ARTHROSCOPY W/ MENISCAL REPAIR Right    RADIOLOGY  WITH ANESTHESIA N/A 07/20/2021   Procedure: IR WITH ANESTHESIA;  Surgeon: Pedro Earls, MD;  Location: [place];  Service: Radiology;  Laterality: N/A;   Family History  Problem Relation Age of Onset   Cancer Mother    Hypertension Mother    Diabetes Mother    Breast cancer Maternal Aunt    Breast cancer Cousin    Social History:  reports that she has never smoked. She has never used smokeless tobacco. She reports that she does not currently use alcohol. She reports that she does not use drugs. Allergies:  Allergies  Allergen Reactions   Canagliflozin     dizziness, stabbing pain in right side of body, tolerates low dose   Hydrocodone Hypertension   Medications Prior to Admission  Medication Sig Dispense Refill   acetaminophen (TYLENOL) 500 MG tablet Take 1,000 mg by mouth every 4 (four) hours as needed for moderate pain.     amLODipine (NORVASC) 10 MG tablet TAKE 1 TABLET EVERY DAY ONCE A DAY ORALLY 90 30 tablet 1   aspirin EC 81 MG tablet Take 1 tablet (81 mg total) by mouth daily. Swallow whole. Start on 07/13/2020. 30 tablet 11   carvedilol (COREG) 25 MG tablet Take 37.5 mg by mouth 2 (two) times daily with a meal.     FARXIGA 5 MG TABS tablet TAKE 1 TABLET (5 MG TOTAL) BY MOUTH DAILY. 30 tablet 1   ibuprofen (ADVIL) 200 MG tablet Take 400 mg by mouth every 4 (four) hours as needed for moderate pain.     insulin aspart (FIASP) 100 UNIT/ML FlexTouch Pen Inject 8 Units into the skin 3 (three) times daily before meals. 15 mL 1   losartan (COZAAR) 100 MG tablet Take 100 mg by mouth daily.     MELATONIN PO Take 1 capsule by mouth at bedtime as needed (sleep).     metFORMIN (GLUCOPHAGE) 1000 MG tablet Take 1,000 mg by mouth daily.     Multiple Vitamins-Calcium (ONE-A-DAY WOMENS PO) Take 1 tablet by mouth daily.     prednisoLONE acetate (PRED FORTE) 1 % ophthalmic suspension Place 1 drop into the left eye daily.     promethazine (PHENERGAN) 12.5 MG tablet Take 12.5 mg by  mouth every 6 (six) hours as needed for nausea or vomiting.     rosuvastatin (CRESTOR) 40 MG tablet Take 40 mg by mouth daily.     Sodium Chloride, Hypertonic, (MURO 818 OP) Place 1 application into the left eye at bedtime.     SODIUM CHLORIDE, HYPERTONIC, OP Place 1 drop into the left eye daily as needed (dry eyes/irritation).     spironolactone (ALDACTONE) 25 MG tablet Take 1 tablet by mouth daily.     ticagrelor (BRILINTA) 90 MG TABS tablet Take 1 tablet (90 mg total) by mouth 2 (two) times daily. Start on 07/13/2021 60 tablet 5   Accu-Chek Softclix Lancets lancets Use to check blood sugar twice daily. 100 each 3   benzonatate (TESSALON) 100  MG capsule Take 1 capsule (100 mg total) by mouth every 8 (eight) hours. (Patient not taking: Reported on 07/16/2021) 21 capsule 0   Continuous Blood Gluc Sensor (DEXCOM G6 SENSOR) MISC Use to monitor blood sugar, change after 10 days 3 each 3   glucose blood (ACCU-CHEK GUIDE) test strip Use as instructed to check blood sugar twice daily. 100 each 12   insulin glargine, 1 Unit Dial, (TOUJEO SOLOSTAR) 300 UNIT/ML Solostar Pen Adjust as directed 3 mL 1   Insulin Syringe-Needle U-100 30G X 1/2" 0.3 ML MISC Use twice a day with insulin 100 each 0   oxyCODONE-acetaminophen (PERCOCET/ROXICET) 5-325 MG tablet Take 1 tablet by mouth every 6 (six) hours as needed for up to 5 doses for severe pain. (Patient not taking: Reported on 07/09/2021) 5 tablet 0   potassium chloride SA (KLOR-CON M20) 20 MEQ tablet Take 1 tablet (20 mEq total) by mouth daily. (Patient not taking: Reported on 07/09/2021) 30 tablet 1    Drug Regimen Review Drug regimen was reviewed and remains appropriate with no significant issues identified  Home: Home Living Family/patient expects to be discharged to:: Private residence Living Arrangements: Spouse/significant other Available Help at Discharge: Family, Available 24 hours/day Type of Home: House Home Access: Ramped entrance Home Layout: One  level Bathroom Shower/Tub: Multimedia programmer: Handicapped height [place]: [place], Hand held shower head, Grab bars - tub/shower Additional Comments: pt's husband home 24/7. He has LLE prosthesis, does not work, does not drive, but ambulates with cane and appears cognitively intact.  Lives With: Spouse   Functional History: Prior Function Prior Level of Function : Independent/Modified Independent, Working/employed, Driving Mobility Comments: independent ADLs Comments: Cook at [employer] Status:  Mobility: Bed Mobility Overal bed mobility: Needs Assistance Bed Mobility: Supine to Sit Supine to sit: Min assist, HOB elevated General bed mobility comments: sitting EOB upon PT arrival Transfers Overall transfer level: Needs assistance Equipment used: Rolling walker (2 wheels) Transfers: Sit to/from Stand Sit to Stand: Min assist Bed to/from chair/wheelchair/BSC transfer type:: Stand pivot Stand pivot transfers: Min assist, +2 safety/equipment General transfer comment: light rise and steady assist, STS x4 during session x1 from EOB x3 from chair in rehab gym Ambulation/Gait Ambulation/Gait assistance: Min guard Gait Distance (Feet): 100 Feet (x2 - to and from rehab gym with rest breaks) Assistive device: Rolling walker (2 wheels) Gait Pattern/deviations: Decreased weight shift to left, Decreased stance time - left, Decreased step length - left, Knees buckling, Decreased dorsiflexion - left General Gait Details: close guard for safety, pt with x2 observed LLE buckles which happen when pt lessens UE support on RW (pt corrected). Verbal cuing for upright posture, placement in RW, increasing L foot clearance via DF Gait velocity: decr Gait velocity interpretation: <1.31 ft/sec, indicative of household ambulator    ADL: ADL Overall ADL's : Needs assistance/impaired Eating/Feeding: Set up, Supervision/ safety, Sitting Grooming: Wash/dry face,  Set up, Supervision/safety, Sitting Upper Body Bathing: Minimal assistance, Sitting Lower Body Bathing: Minimal assistance, +2 for physical assistance, +2 for safety/equipment, Sit to/from stand Upper Body Dressing : Minimal assistance, Sitting Lower Body Dressing: +2 for physical assistance, +2 for safety/equipment, Moderate assistance Toilet Transfer: Minimal assistance, +2 for physical assistance, +2 for safety/equipment, Ambulation, BSC/3in1 Toileting- Clothing Manipulation and Hygiene: Minimal assistance, +2 for physical assistance, +2 for safety/equipment, Sit to/from stand Toileting - Clothing Manipulation Details (indicate cue type and reason): Min A +2 for balance in standing. Pt able to perform peri care with  assistance for balance Functional mobility during ADLs: +2 for physical assistance, +2 for safety/equipment, Rolling walker (2 wheels), Moderate assistance General ADL Comments: Pt presenting with decreased balance, strength, and coorindation.  Cognition: Cognition Overall Cognitive Status: Impaired/Different from baseline Arousal/Alertness: Awake/alert Orientation Level: Oriented X4 Year: 2023 Month: January Day of Week: Correct Attention: Focused, Sustained, Selective Focused Attention: Appears intact Sustained Attention: Appears intact Selective Attention: Impaired Selective Attention Impairment: Verbal complex Memory: Impaired Memory Impairment: Retrieval deficit, Decreased short term memory (Immediate: 5/5; delayed: 2/5; with cues: 3/3) Awareness: Appears intact Problem Solving: Appears intact Executive Function: Writer: Impaired Organizing Impairment: Verbal complex (backward digit span: 1/2) Cognition Arousal/Alertness: Awake/alert Behavior During Therapy: WFL for tasks assessed/performed Overall Cognitive Status: Impaired/Different from baseline General Comments: aware of deficits, difficulty understanding how her procedure led to current  deficits, asks for clarification multiple times  Physical Exam: Blood pressure 135/63, pulse 65, temperature 98.6 F (37 C), temperature source Oral, resp. rate 17, height 5\' 6"  (1.676 m), weight 104.8 kg, last menstrual period 08/02/2013, SpO2 99 %. Physical Exam Vitals and nursing note reviewed.  Constitutional:      Appearance: She is obese.     Comments: A little fuzzy acting; sitting up at bedside;EOB; BMI 37; frustrated, NAD  HENT:     Head: Normocephalic and atraumatic.     Comments: Smile equal; tongue midline    Right Ear: External ear normal.     Left Ear: External ear normal.     Nose: Nose normal. No congestion.     Mouth/Throat:     Mouth: Mucous membranes are dry.     Pharynx: Oropharynx is clear. No oropharyngeal exudate.  Eyes:     General:        Right eye: No discharge.        Left eye: No discharge.     Extraocular Movements: Extraocular movements intact.     Comments: No nystagmus seen  Cardiovascular:     Rate and Rhythm: Normal rate and regular rhythm.     Heart sounds: Normal heart sounds. No murmur heard.   No gallop.  Abdominal:     General: Bowel sounds are normal. There is no distension.     Palpations: Abdomen is soft.     Tenderness: There is no abdominal tenderness. There is no rebound.  Musculoskeletal:     Cervical back: Neck supple. No tenderness.     Comments: Poor effort RUE/RLE 5/5 LUE- 4+/5 in biceps, triceps, WE, grip; FA 4/5 LLE- HF/KE/KF/DF and PF 4/5  Skin:    General: Skin is warm and dry.     Comments: Moderate to severe R hand , wrist and moderate forearm swelling due to radial artery catheterization Fingers also swollen- can barely make fist due to swelling  Neurological:     Mental Status: She is alert.     Comments: Patient is alert.  Makes eye contact with examiner however she did display some decrease in attention.  Oriented x3 and follows commands.  Fair insight and awareness. C/O blurry vision- but no visual field  deficits appreciated Slowed/delayed responses Intact to light touch in all 4 extremites  Psychiatric:     Comments: Frustrated and didn't appear to grasp that stroke symptoms don't completely go away with intervention    Results for orders placed or performed during the hospital encounter of 07/20/21 (from the past 48 hour(s))  Glucose, capillary     Status: Abnormal   Collection Time: 07/22/21 11:44 AM  Result  Value Ref Range   Glucose-Capillary 206 (H) 70 - 99 mg/dL    Comment: Glucose reference range applies only to samples taken after fasting for at least 8 hours.  Glucose, capillary     Status: Abnormal   Collection Time: 07/22/21  4:54 PM  Result Value Ref Range   Glucose-Capillary 297 (H) 70 - 99 mg/dL    Comment: Glucose reference range applies only to samples taken after fasting for at least 8 hours.  Glucose, capillary     Status: Abnormal   Collection Time: 07/22/21  8:58 PM  Result Value Ref Range   Glucose-Capillary 248 (H) 70 - 99 mg/dL    Comment: Glucose reference range applies only to samples taken after fasting for at least 8 hours.  Glucose, capillary     Status: Abnormal   Collection Time: 07/23/21  6:28 AM  Result Value Ref Range   Glucose-Capillary 220 (H) 70 - 99 mg/dL    Comment: Glucose reference range applies only to samples taken after fasting for at least 8 hours.   CT HEAD WO CONTRAST (5MM)  Result Date: 07/21/2021 CLINICAL DATA:  Stroke.  TNK follow-up EXAM: CT HEAD WITHOUT CONTRAST TECHNIQUE: Contiguous axial images were obtained from the base of the skull through the vertex without intravenous contrast. RADIATION DOSE REDUCTION: This exam was performed according to the departmental dose-optimization program which includes automated exposure control, adjustment of the mA and/or kV according to patient size and/or use of iterative reconstruction technique. COMPARISON:  MRI head 07/21/2021.  CT head 07/20/2021 FINDINGS: Brain: Negative for acute hemorrhage.  Ventricle size normal. Patchy white matter hypodensity bilaterally Small areas of acute infarct identified on diffusion-weighted imaging are not well delineated on CT. Vascular: Negative for hyperdense vessel. Prior coiling of the terminal left ICA aneurysm. Skull: Negative Sinuses/Orbits: Mild mucosal edema paranasal sinuses. No orbital lesion. Left cataract extraction. Other: None IMPRESSION: 1. No acute intracranial hemorrhage 2. Diffuse white matter hypodensity compatible with ischemia. Small areas of restricted diffusion best seen on MRI today. Electronically Signed   By: Franchot Gallo M.D.   On: 07/21/2021 13:40       Medical Problem List and Plan: 1. Functional deficits secondary to bilateral scattered small and punctate infarcts likely due to embolism status post procedure of endovascular coiling of left terminal ICA aneurysm 07/20/2021  -patient may  shower  -ELOS/Goals: 7-10 days - mod I to supervision 2.  Antithrombotics: -DVT/anticoagulation:  Mechanical:  Antiembolism stockings, knee (TED hose) Bilateral lower extremities  -antiplatelet therapy: Aspirin 81 mg daily 3. Pain Management: Tylenol as needed 4. Mood: Melatonin as needed  -antipsychotic agents: N/A 5. Neuropsych: This patient is capable of making decisions on her own behalf. 6. Skin/Wound Care: Routine skin checks 7. Fluids/Electrolytes/Nutrition: Routine in and outs with follow-up chemistries 8.  Hypertension.   Norvasc 10 mg daily,Cozaar 100 mg daily, Coreg 37.5 mg twice daily.  Monitor with increased mobility 9.  Diabetes mellitus.  Hemoglobin A1c 8.4.  SSI.  Patient on Glucophage 1000 mg daily, Toujeo prior to admission.  Resume as needed 10.  Hyperlipidemia.  Crestor 11.  Obesity.  BMI 37.28.  Dietary follow-up 12.  Question medical compliance.  Provide counseling 13.  GERD.  Protonix  Lavon Paganini Angiulli, PA-C 07/23/2021

## 2021-07-24 ENCOUNTER — Inpatient Hospital Stay (HOSPITAL_COMMUNITY): Payer: BC Managed Care – PPO

## 2021-07-24 LAB — CBC WITH DIFFERENTIAL/PLATELET
Abs Immature Granulocytes: 0.01 10*3/uL (ref 0.00–0.07)
Basophils Absolute: 0 10*3/uL (ref 0.0–0.1)
Basophils Relative: 1 %
Eosinophils Absolute: 0.2 10*3/uL (ref 0.0–0.5)
Eosinophils Relative: 3 %
HCT: 33.4 % — ABNORMAL LOW (ref 36.0–46.0)
Hemoglobin: 11.1 g/dL — ABNORMAL LOW (ref 12.0–15.0)
Immature Granulocytes: 0 %
Lymphocytes Relative: 40 %
Lymphs Abs: 2.3 10*3/uL (ref 0.7–4.0)
MCH: 29.6 pg (ref 26.0–34.0)
MCHC: 33.2 g/dL (ref 30.0–36.0)
MCV: 89.1 fL (ref 80.0–100.0)
Monocytes Absolute: 0.5 10*3/uL (ref 0.1–1.0)
Monocytes Relative: 9 %
Neutro Abs: 2.8 10*3/uL (ref 1.7–7.7)
Neutrophils Relative %: 47 %
Platelets: 216 10*3/uL (ref 150–400)
RBC: 3.75 MIL/uL — ABNORMAL LOW (ref 3.87–5.11)
RDW: 14 % (ref 11.5–15.5)
WBC: 5.8 10*3/uL (ref 4.0–10.5)
nRBC: 0 % (ref 0.0–0.2)

## 2021-07-24 LAB — GLUCOSE, CAPILLARY
Glucose-Capillary: 188 mg/dL — ABNORMAL HIGH (ref 70–99)
Glucose-Capillary: 177 mg/dL — ABNORMAL HIGH (ref 70–99)
Glucose-Capillary: 215 mg/dL — ABNORMAL HIGH (ref 70–99)
Glucose-Capillary: 249 mg/dL — ABNORMAL HIGH (ref 70–99)

## 2021-07-24 LAB — COMPREHENSIVE METABOLIC PANEL
ALT: 19 U/L (ref 0–44)
AST: 18 U/L (ref 15–41)
Albumin: 3.5 g/dL (ref 3.5–5.0)
Alkaline Phosphatase: 40 U/L (ref 38–126)
Anion gap: 7 (ref 5–15)
BUN: 15 mg/dL (ref 6–20)
CO2: 25 mmol/L (ref 22–32)
Calcium: 9 mg/dL (ref 8.9–10.3)
Chloride: 107 mmol/L (ref 98–111)
Creatinine, Ser: 0.76 mg/dL (ref 0.44–1.00)
GFR, Estimated: >60 mL/min (ref >60)
Glucose, Bld: 201 mg/dL — ABNORMAL HIGH (ref 70–99)
Potassium: 3.9 mmol/L (ref 3.5–5.1)
Sodium: 139 mmol/L (ref 135–145)
Total Bilirubin: 0.3 mg/dL (ref 0.3–1.2)
Total Protein: 6.8 g/dL (ref 6.5–8.1)

## 2021-07-24 IMAGING — CT CT HEAD W/O CM
3 series · 15 of 47 positions shown, 18 images · non-contrast
Comparison: [DATE]

CLINICAL DATA: Severe headache, hypertension, diabetes mellitus



[Series 3: head 5.0 h31s · axial · 0.44mm/px · z∈[+871,+1011]mm · 9 of 34 slices shown, 12 images]
[im 3/34  brain]
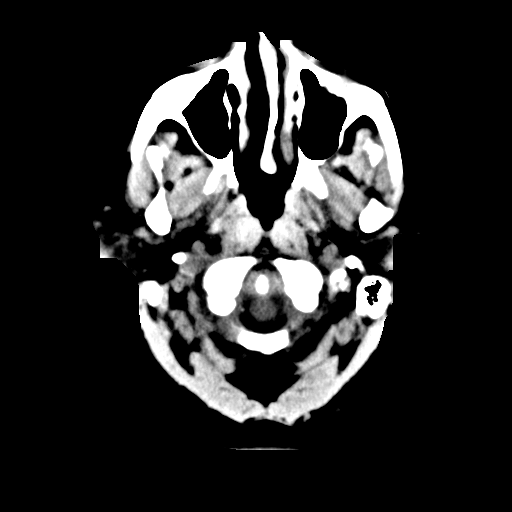
[im 3/34  bone]
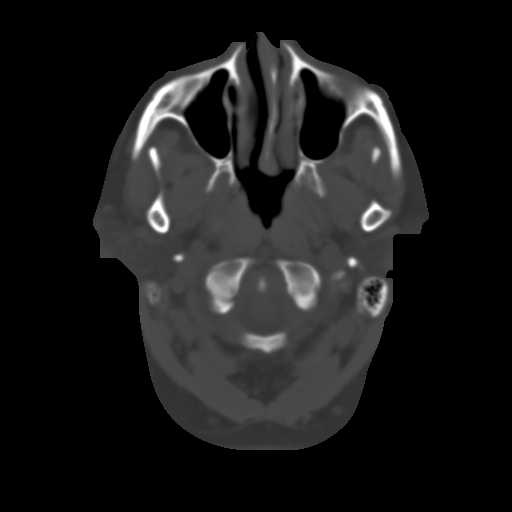
[im 6/34  brain]
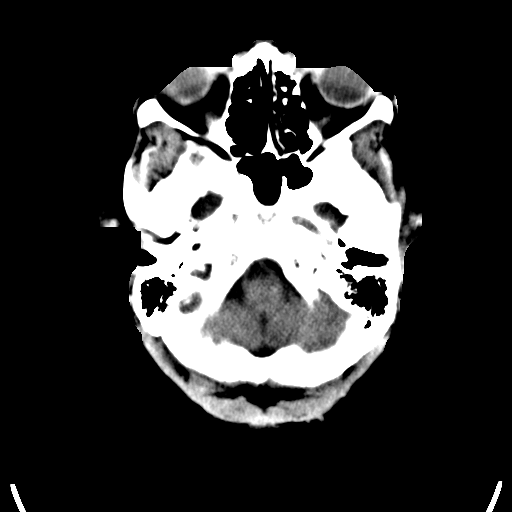
[im 10/34  brain]
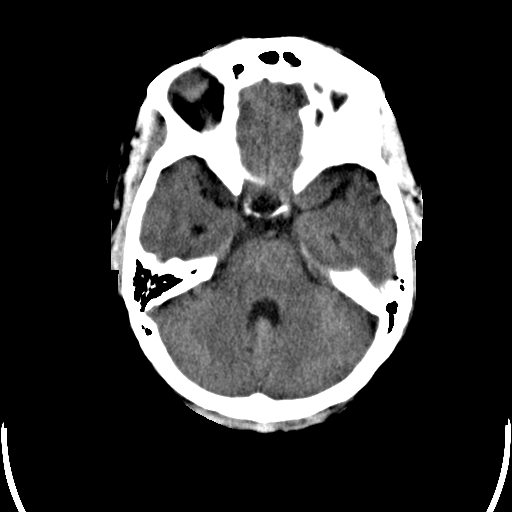
[im 13/34  brain]
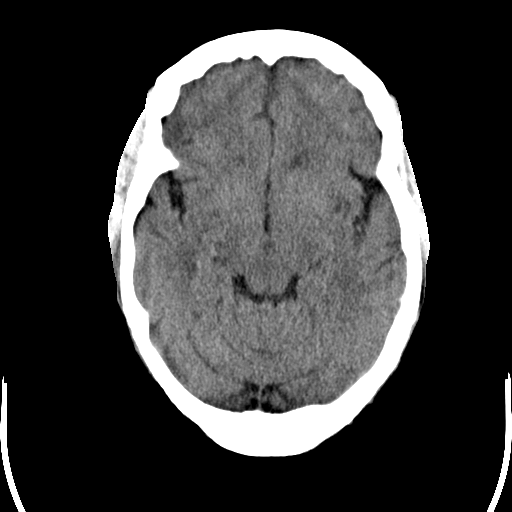
[im 18/34  brain]
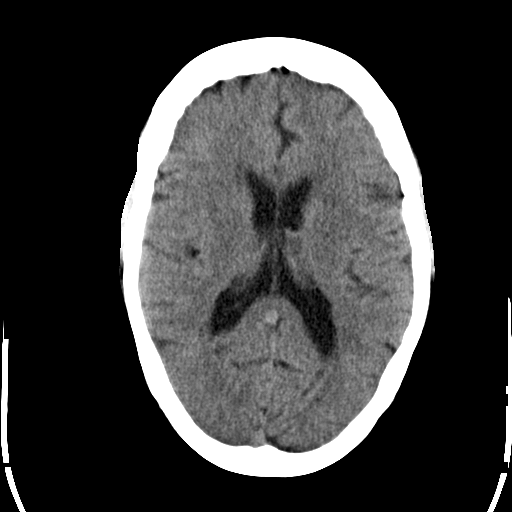
[im 18/34  bone]
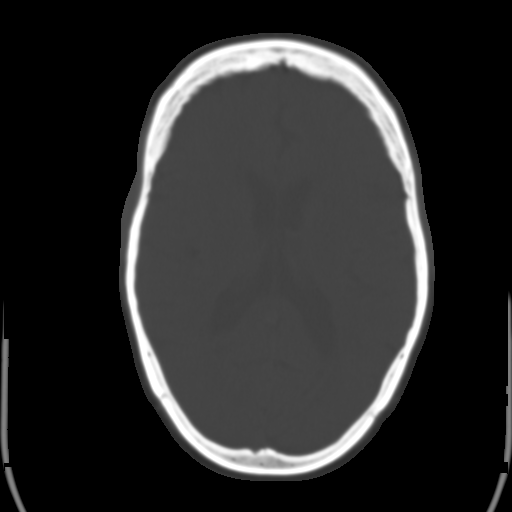
[im 21/34  brain]
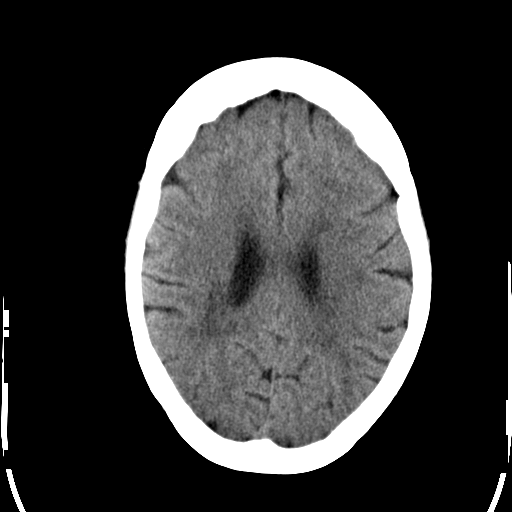
[im 24/34  brain]
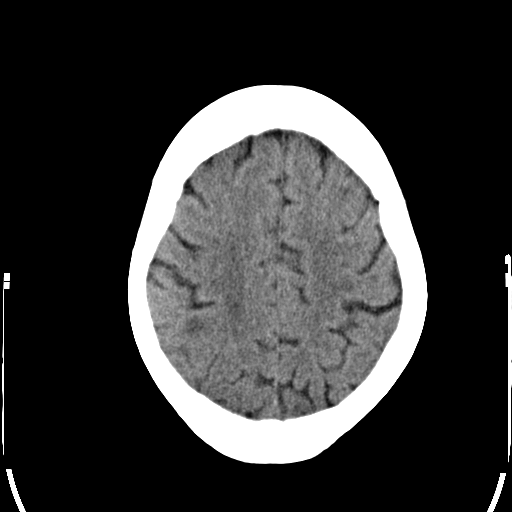
[im 28/34  brain]
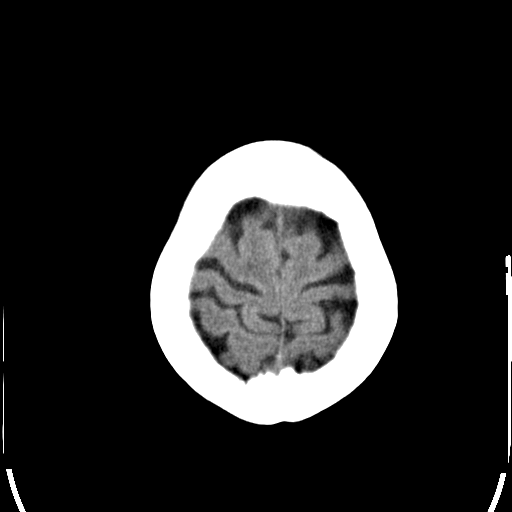
[im 31/34  brain]
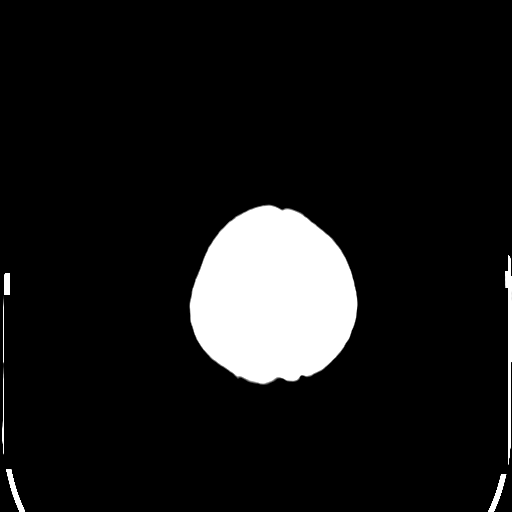
[im 31/34  bone]
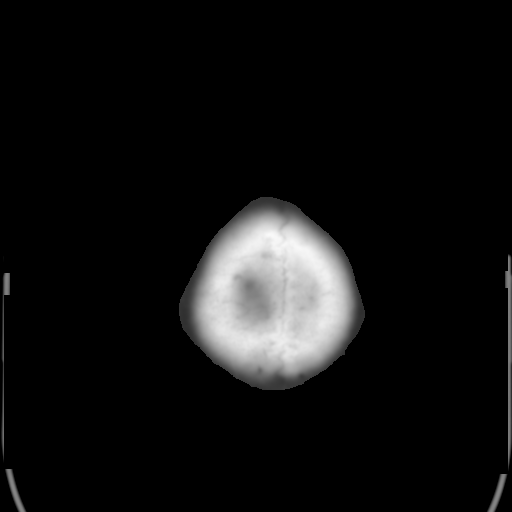

[Series 5: head 3.0 mpr cor · coronal · 0.33mm/px · 3 of 82 slices shown]
[im 28/82  brain]
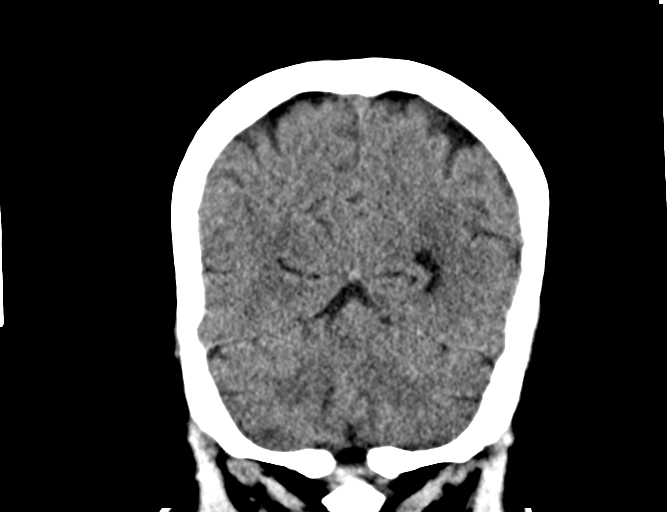
[im 37/82  brain]
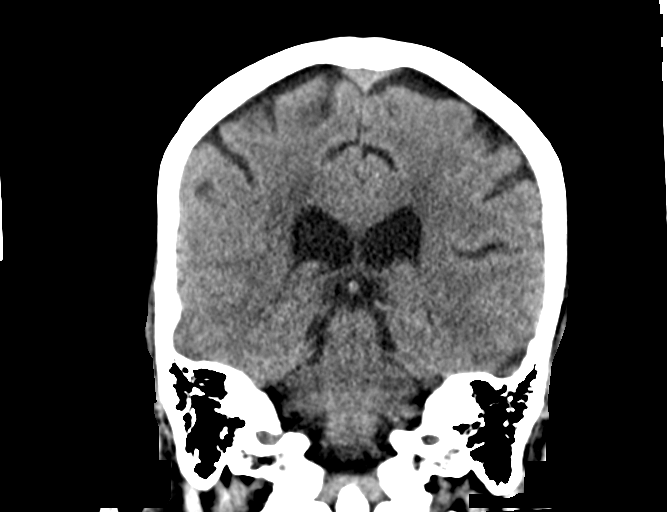
[im 46/82  brain]
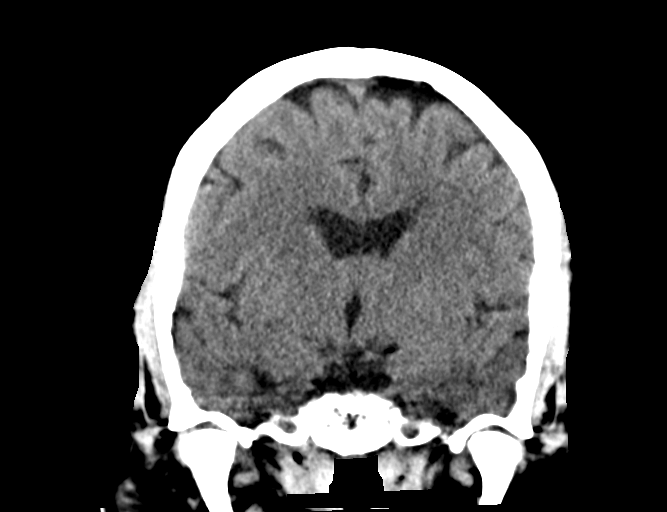

[Series 6: head 3.0 mpr sag · sagittal · 0.35mm/px · 3 of 73 slices shown]
[im 26/73  brain]
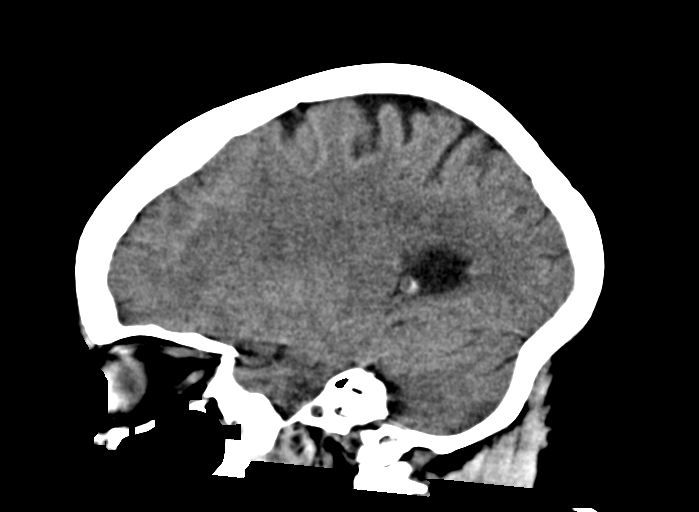
[im 37/73  brain]
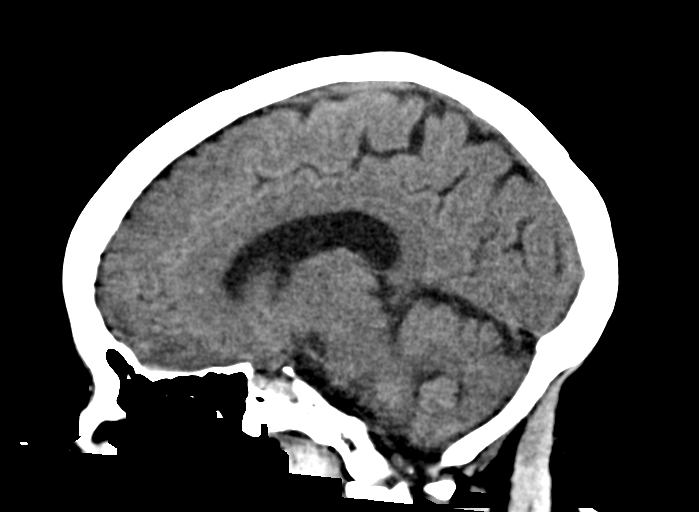
[im 47/73  brain]
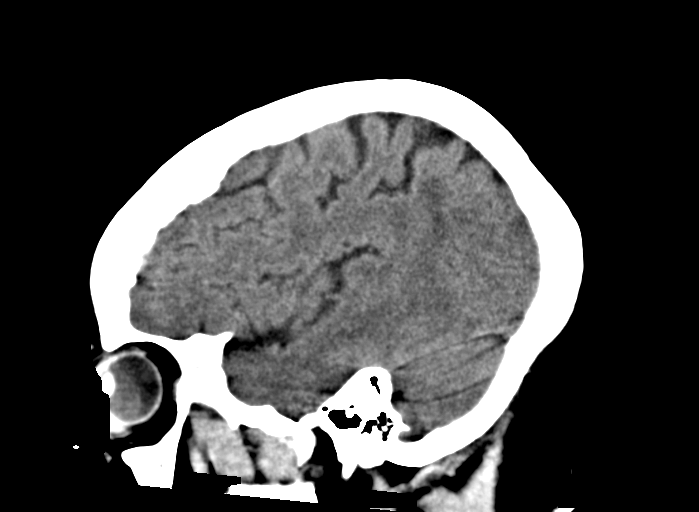

[15 of 47 positions shown; findings below may reference images not displayed]

FINDINGS: Brain: Normal ventricular morphology. No midline shift or mass
effect. Small vessel chronic ischemic changes of deep cerebral white
matter. No intracranial hemorrhage, mass lesion, or evidence of
acute infarction. No extra-axial fluid collections.

Vascular: No hyperdense vessels. Aneurysm coil at LEFT ICA
bifurcation again seen.

Skull: Intact

Sinuses/Orbits: Clear

Other: N/A
IMPRESSION: Prior aneurysm coiling.

Small vessel chronic ischemic changes of deep cerebral white matter.

No acute intracranial abnormalities.

## 2021-07-24 MED ORDER — INSULIN GLARGINE-YFGN 100 UNIT/ML ~~LOC~~ SOLN
10.0000 [IU] | Freq: Every day | SUBCUTANEOUS | Status: DC
  Administered 2021-07-24 – 2021-07-31 (×8): 10 [IU] via SUBCUTANEOUS
  Filled 2021-07-24 (×10): qty 0.1

## 2021-07-24 NOTE — Progress Notes (Signed)
PT approached with concerns of pt increased weakness to right side. On assessment this AM she had weakness of right hand r/t pain/swelling but no other deficits noted to right leg. Pt was self transferring in room by self. Now, pt required max assist to bed by PT staff. Dan PA made aware. CT ordered vitals obtained.   07/24/21 1407  Vitals  Temp 98.5 F (36.9 C)  Temp Source Oral  BP 131/79  MAP (mmHg) 94  BP Location Left Arm  BP Method Automatic  Patient Position (if appropriate) Sitting  Pulse Rate 73  Pulse Rate Source Monitor  Resp 18  MEWS COLOR  MEWS Score Color Green  Oxygen Therapy  SpO2 98 %  O2 Device Room Air   Sheela Stack, LPN

## 2021-07-24 NOTE — Evaluation (Signed)
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  Past Medical History:  Past Medical History:  Diagnosis Date   Abnormal EKG    LVH with strain   Acid reflux    Asthma    Depression    Diabetes mellitus    A1C over 9   HLD (hyperlipidemia)    Hypertension    Noncompliance    Obesity    Pneumonia    Sleep apnea    Past Surgical History:  Past Surgical History:  Procedure Laterality Date   CARDIAC CATHETERIZATION     CATARACT EXTRACTION Left 06/04/2021   COLONOSCOPY WITH PROPOFOL N/A 04/29/2015   Procedure: COLONOSCOPY WITH PROPOFOL;  Surgeon: Garlan Fair, MD;  Location: [facility];  Service: Endoscopy;  Laterality: N/A;   EYE SURGERY     HYSTEROSCOPY WITH D & C N/A 09/07/2017   Procedure: DILATATION AND CURETTAGE /HYSTEROSCOPY;  Surgeon: Sloan Leiter, MD;  Location: [place];  Service: Gynecology;  Laterality: N/A;   IR ANGIO INTRA EXTRACRAN SEL COM CAROTID INNOMINATE UNI R MOD SED  06/18/2021   IR ANGIO INTRA EXTRACRAN SEL INTERNAL CAROTID UNI L MOD SED  06/18/2021   IR ANGIO INTRA EXTRACRAN SEL INTERNAL CAROTID UNI R MOD SED  07/20/2021   IR ANGIO VERTEBRAL SEL VERTEBRAL UNI R MOD SED  06/18/2021   IR ANGIOGRAM FOLLOW UP STUDY  07/20/2021   IR CT HEAD LTD  07/20/2021   IR RADIOLOGIST EVAL & MGMT  06/19/2021   IR TRANSCATH/EMBOLIZ  07/20/2021   IR US GUIDE VASC ACCESS RIGHT  06/18/2021   KNEE ARTHROSCOPY W/ MENISCAL REPAIR Right    RADIOLOGY WITH ANESTHESIA N/A 07/20/2021   Procedure: IR WITH ANESTHESIA;  Surgeon: Pedro Earls, MD;   Location: [place];  Service: Radiology;  Laterality: N/A;    Assessment / Plan / Recommendation Clinical Impression Patient  is a 61 year old right-handed female with history of diabetes mellitus, hypertension, hyperlipidemia, obesity with BMI 37.28, OSA, question medical compliance.  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  Post procedure left side decreased sensation as well as left-sided weakness.  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  No associated hemorrhage or mass-effect.  Therapy evaluations completed with recommendations for a comprehensive rehab program. Patient admitted 07/23/21.  Patient was administered the Cognistat and scored WFL on all subtests with the exception of mild impairments in short-term recall. Patient's overall functional communication was [facility] throughout tasks assessed, however, mild and intermittent high-level word-finding deficits were noted throughout functional conversation. Patient also endorsed deficits in memory and difficulty with word-finding. Patient would benefit from skilled SLP intervention to maximize her cognitive-linguistic functioning and overall functional independence prior to discharge.    Skilled Therapeutic Interventions          Administered a cognitive-linguistic evaluation, please see above for details.   SLP Assessment  Patient will need skilled [place] [facility]  Services during CIR admission    Recommendations  Oral Care Recommendations: Oral care BID Recommendations for Other Services: Neuropsych consult Patient destination: Home Follow up Recommendations:  (TBD) Equipment Recommended: None recommended by SLP    SLP Frequency 3 to 5 out of 7 days    SLP Duration  SLP Intensity  SLP Treatment/Interventions 7-10 days  Minumum of 1-2 x/day, 30 to 90 minutes  Cognitive remediation/compensation;Speech/Language facilitation;Cueing hierarchy;Environmental controls;Therapeutic Activities;Functional tasks;Patient/family education    Pain Pain Assessment Pain Scale: 0-10 Pain Score: 5  Pain Type: Acute pain Pain Location: Knee Pain Orientation: Left Pain Descriptors / Indicators: Aching Pain Onset: With Activity Multiple Pain Sites: Yes 2nd Pain Site Pain Score: 6 Pain Type: Acute pain Pain Location: Other (Comment) (R hand and R side of body) Pain Orientation: Right Pain Descriptors / Indicators: Heaviness Pain Onset: Sudden  Prior Functioning Type of Home: House  Lives With: Spouse Available Help at Discharge: Family Vocation: Full time employment  SLP Evaluation Cognition Overall Cognitive Status: Impaired/Different from baseline Arousal/Alertness: Awake/alert Orientation Level: Oriented X4 Focused Attention: Appears intact Sustained Attention: Appears intact Selective Attention: Appears intact Memory: Impaired Memory Impairment: Decreased short term memory;Decreased recall of new information Awareness: Appears intact Problem Solving: Appears intact Safety/Judgment: Impaired  Comprehension Auditory Comprehension Overall Auditory Comprehension: Appears within functional limits for tasks assessed Expression Expression Primary Mode of Expression: Verbal Verbal Expression Overall Verbal Expression: Impaired Other Verbal Expression Comments: mild and intermittent high-level word-finding deficits noted Written Expression Dominant Hand: Right Written Expression: Within Functional Limits Oral Motor Oral Motor/Sensory Function Overall Oral Motor/Sensory Function: Within functional limits Motor Speech Overall Motor Speech: Appears within functional limits for tasks assessed  Care Tool Care Tool  Cognition Ability to hear (with hearing aid or hearing appliances if normally used Ability to hear (with hearing aid or hearing appliances if normally used): 0. Adequate - no difficulty in normal conservation, social interaction, listening to TV   Expression of Ideas and Wants Expression of Ideas and Wants: 4. Without difficulty (complex and basic) - expresses complex messages without difficulty and with speech that is clear and easy to understand   Understanding Verbal and Non-Verbal Content Understanding Verbal and Non-Verbal Content: 4. Understands (complex and basic) - clear comprehension without cues or repetitions  Memory/Recall Ability Memory/Recall Ability : Current season;Location of own room;That he or she is in a hospital/hospital unit;Staff names and faces    Short Term Goals: Week 1: SLP Short Term Goal 1 (Week 1): STGs=LTGs due to ELOS  Refer to Care Plan for Long Term Goals  Recommendations for other services: Neuropsych  Discharge Criteria: Patient will be discharged from SLP if patient refuses treatment 3 consecutive times without medical reason, if treatment goals not met, if there is a change in medical status, if patient makes no progress towards goals or if patient is discharged from hospital.  The above assessment, treatment plan, treatment alternatives and goals were discussed and mutually agreed upon: by patient  Aubrynn Katona 07/24/2021, 3:45 PM

## 2021-07-24 NOTE — Evaluation (Addendum)
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   Past Medical History:  Past Medical History:  Diagnosis Date   Abnormal EKG    LVH with strain   Acid reflux    Asthma    Depression    Diabetes mellitus    A1C over 9   HLD (hyperlipidemia)    Hypertension    Noncompliance    Obesity    Pneumonia    Sleep apnea    Past Surgical History:  Past Surgical History:  Procedure Laterality Date   CARDIAC CATHETERIZATION     CATARACT EXTRACTION Left 06/04/2021   COLONOSCOPY WITH PROPOFOL N/A 04/29/2015   Procedure: COLONOSCOPY WITH PROPOFOL;  Surgeon: Garlan Fair, MD;  Location: [facility];  Service: Endoscopy;  Laterality: N/A;   EYE SURGERY     HYSTEROSCOPY WITH D & C N/A 09/07/2017   Procedure: DILATATION AND CURETTAGE /HYSTEROSCOPY;  Surgeon: Sloan Leiter, MD;  Location: [place];  Service: Gynecology;  Laterality: N/A;   IR ANGIO INTRA EXTRACRAN SEL COM CAROTID INNOMINATE UNI R MOD SED  06/18/2021   IR ANGIO INTRA EXTRACRAN SEL INTERNAL CAROTID UNI L MOD SED  06/18/2021   IR ANGIO INTRA EXTRACRAN SEL INTERNAL CAROTID UNI R MOD SED  07/20/2021   IR ANGIO VERTEBRAL SEL VERTEBRAL UNI R MOD SED  06/18/2021   IR ANGIOGRAM FOLLOW UP STUDY  07/20/2021   IR CT HEAD LTD  07/20/2021   IR RADIOLOGIST EVAL & MGMT  06/19/2021   IR TRANSCATH/EMBOLIZ  07/20/2021   IR US GUIDE VASC ACCESS RIGHT  06/18/2021   KNEE ARTHROSCOPY W/ MENISCAL REPAIR Right    RADIOLOGY WITH ANESTHESIA N/A 07/20/2021   Procedure: IR WITH ANESTHESIA;  Surgeon: Pedro Earls, MD;  Location: [place];  Service: Radiology;  Laterality: N/A;    Assessment & Plan Clinical Impression: Patient is a 61 y.o. year old female right-handed female with history of diabetes mellitus, hypertension, hyperlipidemia, obesity with BMI 37.28, OSA, question medical compliance.  Per chart review patient lives with spouse.  1 level home ramped entrance.  Independent prior to admission working as a Training and development officer at [employer].  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  Post procedure left side decreased sensation as well as left-sided weakness.  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  No associated hemorrhage or mass-effect.  CT angiogram head and neck no intracranial large vessel occlusion or significant stenosis.  Echocardiogram with ejection fraction of 65 to 70% no wall motion abnormalities grade 1 diastolic dysfunction.  Patient is currently maintained on low-dose aspirin and no plan for Brilinta.  She did initially need Cleviprex for blood pressure control. .  Patient transferred to CIR on 07/23/2021 .   Patient currently requires max with mobility secondary to muscle weakness, ataxia and decreased coordination, and decreased standing balance, decreased postural control, hemiplegia, and decreased  balance strategies.  Prior to hospitalization, patient was independent  with mobility and lived with Spouse in a House home.  Home access is  Ramped entrance.  Patient will benefit from skilled PT intervention to maximize safe functional mobility, minimize fall risk, and decrease caregiver burden for planned discharge home with 24 hour supervision.  Anticipate patient will benefit from follow up [place] at  discharge.  PT - End of Session Activity Tolerance: Decreased this session Endurance Deficit: Yes Endurance Deficit Description: PT Assessment Rehab Potential (ACUTE/IP ONLY): Good PT Barriers to Discharge: Decreased caregiver support PT Barriers to Discharge Comments: husband home but has own medical needs and unsure of assist he can provide PT Patient demonstrates impairments in the following area(s): Balance;Endurance;Motor;Pain;Safety PT Transfers Functional Problem(s): Bed Mobility;Car;Bed to Chair PT Locomotion Functional Problem(s): Ambulation;Stairs;Wheelchair Mobility PT Plan PT Intensity: Minimum of 1-2 x/day ,45 to 90 minutes PT Frequency: 5 out of 7 days PT Duration Estimated Length of Stay: 14 days PT Treatment/Interventions: Ambulation/gait training;Balance/vestibular training;Community reintegration;Disease management/prevention;Discharge planning;DME/adaptive equipment instruction;Functional mobility training;Pain management;UE/LE Strength taining/ROM;Visual/perceptual remediation/compensation;UE/LE Coordination activities;Wheelchair propulsion/positioning;Therapeutic Exercise;Therapeutic Activities;Stair training;Patient/family education;Neuromuscular re-education PT Transfers Anticipated Outcome(s): minA w/ LRAD PT Locomotion Anticipated Outcome(s): minA w/ LRAD PT Recommendation Follow Up Recommendations: Home health PT Patient destination: Home Equipment Recommended: To be determined   PT Evaluation Precautions/Restrictions Precautions Precautions: Fall Restrictions Weight Bearing Restrictions: No General Chart Reviewed: Yes Response to Previous Treatment: Patient reporting fatigue but able to participate. Family/Caregiver Present: Yes Vital SignsTherapy Vitals Temp: 98.5 F (36.9 C) Temp Source: Oral Pulse Rate: 73 Resp: 18 BP: 131/79 Patient Position (if appropriate): Sitting Oxygen Therapy SpO2: 98 % O2 Device: Room Air Pain Pain Assessment Pain  Scale: 0-10 Pain Score: 5  Pain Type: Acute pain Pain Location: Knee Pain Orientation: Left Pain Descriptors / Indicators: Aching Pain Onset: With Activity Multiple Pain Sites: Yes 2nd Pain Site Pain Score: 6 Pain Type: Acute pain Pain Location: Other (Comment) (R hand and R side of body) Pain Orientation: Right Pain Descriptors / Indicators: Heaviness Pain Onset: Sudden Pain Interference Pain Interference Pain Effect on Sleep: 2. Occasionally Pain Interference with Therapy Activities: 1. Rarely or not at all Pain Interference with Day-to-Day Activities: 1. Rarely or not at all Home Living/Prior [place] Available Help at Discharge: Family Type of Home: House Home Access: Ramped entrance Home Layout: One level Bathroom Shower/Tub: Walk-in shower;Other (comment) (bench in shower and hand held shower head) Bathroom Toilet: Handicapped height Bathroom Accessibility:  (to be assessed) Additional Comments: pt's husband home 24/7. He has LLE prosthesis, does not work, does not drive, but ambulates with cane and appears cognitively intact.  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 Unable to read white board or clock in room using only L eye Perception Perception: Within Functional  Limits Praxis Praxis: Not tested Praxis Impairment Details: Motor planning  Cognition Overall Cognitive Status: Impaired/Different from baseline Arousal/Alertness: Awake/alert Orientation Level: Oriented X4 Focused Attention: Appears intact Sustained Attention: Appears intact Selective Attention: Appears  intact Memory: Impaired Memory Impairment: Decreased short term memory;Decreased recall of new information Awareness: Appears intact Problem Solving: Appears intact Safety/Judgment: Impaired Comments: eval limited due to acute R sided weakness Sensation Sensation Light Touch: Appears Intact Central sensation comments: BLE appears intact Hot/Cold: Not tested Proprioception: Appears Intact Stereognosis: Not tested Additional Comments: BLE big toe proprioception intact, unable to asses in functional context Coordination Gross Motor Movements are Fluid and Coordinated: No Fine Motor Movements are Fluid and Coordinated: Not tested Coordination and Movement Description: B dysmetric Finger Nose Finger Test: hypometria BUE, RUE>LUE, unable to change speed when cued Heel Shin Test: hypometric BLE, RLE>LE Motor  Motor Motor: Ataxia;Abnormal postural alignment and control;Hemiplegia Motor - Skilled Clinical Observations: R shoulder flex, abd AROM < L shoulder AROM, ataxic   Trunk/Postural Assessment  Cervical Assessment Cervical Assessment: Within Functional Limits Thoracic Assessment Thoracic Assessment: Within Functional Limits Lumbar Assessment Lumbar Assessment: Within Functional Limits Postural Control Postural Control: Deficits on evaluation (modA in static standing)  Balance Balance Balance Assessed: Yes Standardized Balance Assessment Standardized Balance Assessment: Static Sitting Balance Static Sitting - Balance Support: Feet supported Static Sitting - Level of Assistance: 5: Stand by assistance Dynamic Sitting Balance Dynamic Sitting - Balance Support: Feet  supported Dynamic Sitting - Level of Assistance: 5: Stand by assistance Static Standing Balance Static Standing - Balance Support: During functional activity Static Standing - Level of Assistance: 3: Mod assist Static Standing - Comment/# of Minutes: 20 sec. modA for balance Dynamic Standing Balance Dynamic Standing - Balance Support: During functional activity Dynamic Standing - Level of Assistance: 4: Min assist Extremity Assessment  RUE Assessment RUE Assessment: Exceptions to [facility] Active Range of Motion (AROM) Comments: Shoulder flex and ABD < L General Strength Comments: 3+/5 elbow flex/ext LUE Assessment LUE Assessment: Exceptions to [facility] Active Range of Motion (AROM) Comments: shoulder flex and ABD WFL RLE Assessment RLE Assessment: Exceptions to [facility] Active Range of Motion (AROM) Comments: WFL General Strength Comments: grossly 3+/5, pt noted this is new weakness from this morning RLE Strength RLE Overall Strength: Deficits Right Hip Flexion: 3+/5 Right Knee Flexion: 3-/5 Right Knee Extension: 3+/5 Right Ankle Dorsiflexion: 3+/5 LLE Assessment LLE Assessment: Exceptions to [facility] Active Range of Motion (AROM) Comments: WFL General Strength Comments: grossly 4/5 in knee and ankle, 3+/5 in hips  Care Tool Care Tool Bed Mobility Roll left and right activity   Roll left and right assist level: Minimal Assistance - Patient > 75%    Sit to lying activity   Sit to lying assist level: Minimal Assistance - Patient > 75%    Lying to sitting on side of bed activity   Lying to sitting on side of bed assist level: the ability to move from lying on the back to sitting on the side of the bed with no back support.: Moderate Assistance - Patient 50 - 74%     Care Tool Transfers Sit to stand transfer   Sit to stand assist level: Moderate Assistance - Patient 50 - 74%    Chair/bed transfer   Chair/bed transfer assist level: Maximal Assistance - Patient 25 - 49%     Advertising account executive transfer activity did not occur: Safety/medical concerns        Care Tool Locomotion Ambulation Ambulation activity did not occur: Safety/medical concerns        Walk 10 feet activity Walk 10 feet activity did not occur: Safety/medical concerns  Walk 50 feet with 2 turns activity Walk 50 feet with 2 turns activity did not occur: Safety/medical concerns      Walk 150 feet activity Walk 150 feet activity did not occur: Safety/medical concerns      Walk 10 feet on uneven surfaces activity Walk 10 feet on uneven surfaces activity did not occur: Safety/medical concerns      Stairs Stair activity did not occur: Safety/medical concerns        Walk up/down 1 step activity Walk up/down 1 step or curb (drop down) activity did not occur: Safety/medical concerns      Walk up/down 4 steps activity Walk up/down 4 steps activity did not occur: Safety/medical concerns      Walk up/down 12 steps activity Walk up/down 12 steps activity did not occur: Safety/medical concerns      Pick up small objects from floor Pick up small object from the floor (from standing position) activity did not occur: Safety/medical concerns      Wheelchair Is the patient using a wheelchair?: No   Wheelchair activity did not occur: Safety/medical concerns      Wheel 50 feet with 2 turns activity Wheelchair 50 feet with 2 turns activity did not occur: Safety/medical concerns    Wheel 150 feet activity Wheelchair 150 feet activity did not occur: Safety/medical concerns      Refer to Care Plan for Long Term Goals  SHORT TERM GOAL WEEK 1 PT Short Term Goal 1 (Week 1): Pt will perform bed mobility w/ S. PT Short Term Goal 2 (Week 1): Pt will perform sit to stand w/ minA PT Short Term Goal 3 (Week 1): Pt will perform static standing for 1 min w/ minA PT Short Term Goal 4 (Week 1): Pt will perform 15 ft ambulation w/ LRAD modA  Recommendations for other services: None    Skilled Therapeutic Intervention Mobility Bed Mobility Bed Mobility: Sit to Supine;Scooting to [facility] Rolling Right: Contact Guard/Touching assist Rolling Left: Contact Guard/Touching assist Supine to Sit: Contact Guard/Touching assist Sitting - Scoot to Edge of Bed: Contact Guard/Touching assist Sit to Supine: Minimal Assistance - Patient > 75% Scooting to [facility]: Supervision/Verbal Cueing Transfers Transfers: Squat Pivot Transfers Sit to Stand: Moderate Assistance - Patient 50-74% Stand to Sit: Contact Guard/Touching assist Squat Pivot Transfers: Maximal Assistance - Patient 25-49% Transfer (Assistive device): None Locomotion  Gait Ambulation: No Stairs / Additional Locomotion Stairs: No Wheelchair Mobility Wheelchair Mobility: No  Pt met sitting in recliner w/ husband present. Pt oriented x4 and educated on rehab process including going to bathroom w/o calling NT. Pt reported new onset R sided heaviness since this morning. Motor, neuro, and vision exam completed as noted above w/ significant difference from previous MD notes. BP within normal range and no change in vision, speech, cognition noted. Nurse and PA notified of change in status, w/ PA recommending follow up CT scan. Pt transferred squat pivot from recliner to bed maxA x1, minA sit<>supine, and S w/ scooting to [facility]. Pt left in bed, bed alarm on, call bell in hand, husband present and all needs met.  Discharge Criteria: Patient will be discharged from PT if patient refuses treatment 3 consecutive times without medical reason, if treatment goals not met, if there is a change in medical status, if patient makes no progress towards goals or if patient is discharged from hospital.  The above assessment, treatment plan, treatment alternatives and goals were discussed and mutually agreed upon: by patient  Ansel Bong  Rosana Hoes, SPT 07/24/2021, 2:49 PM

## 2021-07-24 NOTE — Progress Notes (Signed)
Patient ID: Shannon Oliver, female   DOB: 05-22-1961, 61 y.o.   MRN: [id number] Met with the patient and spouse to review rehab process, team conference and plan of care. Discussed secondary risks including DM (A1c 8.4), HTN and HLD (LDL 69).  Reported felt she had her BP under control prior to admission. Discussed dietary modification recommendations and given handouts for reference. Continue to follow along to discharge to address educational needs and facilitate preparation for discharge. Reported right wrist discomfort; feels like she has sprained it and edema of top of hand noted. OT to address. Margarito Liner

## 2021-07-24 NOTE — Progress Notes (Signed)
Inpatient Rehabilitation  Patient information reviewed and entered into eRehab system by Bradshaw Minihan Taler Kushner, OTR/L.   Information including medical coding, functional ability and quality indicators will be reviewed and updated through discharge.    

## 2021-07-24 NOTE — Discharge Instructions (Addendum)
Inpatient Rehab Discharge Instructions  Shannon Oliver [place] Discharge date and time: No discharge date for patient encounter.   Activities/Precautions/ Functional Status: Activity: activity as tolerated Diet: Diabetic diet Wound Care: Routine skin checks Functional status:  ___ No restrictions     ___ Walk up steps independently ___ 24/7 supervision/assistance   ___ Walk up steps with assistance ___ Intermittent supervision/assistance  ___ Bathe/dress independently ___ Walk with walker     __x_ Bathe/dress with assistance ___ Walk Independently    ___ Shower independently ___ Walk with assistance    ___ Shower with assistance ___ No alcohol     ___ Return to work/school ________  COMMUNITY REFERRALS UPON DISCHARGE:     Outpatient: PT     OT    ST              Agency: [facility] Phone: [phone]              Appointment Date/Time: TBD    Special Instructions:  Continue aspirin 81 mg p.o. daily and Brilinta 90 mg twice daily until 08/26/2021 then aspirin alone     No driving smoking or alcoholSTROKE/TIA DISCHARGE INSTRUCTIONS SMOKING Cigarette smoking nearly doubles your risk of having a stroke & is the single most alterable risk factor  If you smoke or have smoked in the last 12 months, you are advised to quit smoking for your health. Most of the excess cardiovascular risk related to smoking disappears within a year of stopping. Ask you doctor about anti-smoking medications [place] Quit Line: 1-800-QUIT NOW Free Smoking Cessation Classes [phone]  CHOLESTEROL Know your levels; limit fat & cholesterol in your diet  Lipid Panel     Component Value Date/Time   CHOL 129 07/21/2021 0111   TRIG 53 07/21/2021 0111   HDL 49 07/21/2021 0111   CHOLHDL 2.6 07/21/2021 0111   VLDL 11 07/21/2021 0111   LDLCALC 69 07/21/2021 0111     Many patients benefit from treatment even if their cholesterol is at goal. Goal: Total Cholesterol (CHOL) less than 160 Goal:  Triglycerides (TRIG)  less than 150 Goal:  HDL greater than 40 Goal:  LDL (LDLCALC) less than 100   BLOOD PRESSURE American Stroke Association blood pressure target is less that 120/80 mm/Hg  Your discharge blood pressure is:  BP: (!) 157/98 Monitor your blood pressure Limit your salt and alcohol intake Many individuals will require more than one medication for high blood pressure  DIABETES (A1c is a blood sugar average for last 3 months) Goal HGBA1c is under 7% (HBGA1c is blood sugar average for last 3 months)  Diabetes:   Lab Results  Component Value Date   HGBA1C 8.4 (H) 07/21/2021    Your HGBA1c can be lowered with medications, healthy diet, and exercise. Check your blood sugar as directed by your physician Call your physician if you experience unexplained or low blood sugars.  PHYSICAL ACTIVITY/REHABILITATION Goal is 30 minutes at least 4 days per week  Activity: Increase activity slowly, Therapies: Physical Therapy: Home Health Return to work:  Activity decreases your risk of heart attack and stroke and makes your heart stronger.  It helps control your weight and blood pressure; helps you relax and can improve your mood. Participate in a regular exercise program. Talk with your doctor about the best form of exercise for you (dancing, walking, swimming, cycling).  DIET/WEIGHT Goal is to maintain a healthy weight  Your discharge diet is:  Diet Order  Diet Heart Room service appropriate? Yes; Fluid consistency: Thin  Diet effective now                   liquids Your height is:  Height: 5\' 6"  (167.6 cm) Your current weight is: Weight: 104.5 kg Your Body Mass Index (BMI) is:  BMI (Calculated): 37.2 Following the type of diet specifically designed for you will help prevent another stroke. Your goal weight range is:   Your goal Body Mass Index (BMI) is 19-24. Healthy food habits can help reduce 3 risk factors for stroke:  High cholesterol, hypertension, and excess weight.  RESOURCES  Stroke/Support Group:  Call [phone]   STROKE EDUCATION PROVIDED/REVIEWED AND GIVEN TO PATIENT Stroke warning signs and symptoms How to activate emergency medical system (call 911). Medications prescribed at discharge. Need for follow-up after discharge. Personal risk factors for stroke. Pneumonia vaccine given: No Flu vaccine given: No My questions have been answered, the writing is legible, and I understand these instructions.  I will adhere to these goals & educational materials that have been provided to me after my discharge from the hospital.      My questions have been answered and I understand these instructions. I will adhere to these goals and the provided educational materials after my discharge from the hospital.  Patient/Caregiver Signature _______________________________ Date __________  Clinician Signature _______________________________________ Date __________  Please bring this form and your medication list with you to all your follow-up doctor's appointments.

## 2021-07-24 NOTE — Progress Notes (Signed)
Inpatient [facility] Individual Statement of Services  Patient Name:  Shannon Oliver  Date:  07/24/2021  Welcome to the [facility].  Our goal is to provide you with an individualized program based on your diagnosis and situation, designed to meet your specific needs.  With this comprehensive rehabilitation program, you will be expected to participate in at least 3 hours of rehabilitation therapies Monday-Friday, with modified therapy programming on the weekends.  Your rehabilitation program will include the following services:  Physical Therapy (PT), Occupational Therapy (OT), Speech Therapy (ST), 24 hour per day rehabilitation nursing, Therapeutic Recreaction (TR), Neuropsychology, Care Coordinator, Rehabilitation Medicine, Nutrition Services, Pharmacy Services, and Other  Weekly team conferences will be held on Wednesdays to discuss your progress.  Your Inpatient Rehabilitation Care Coordinator will talk with you frequently to get your input and to update you on team discussions.  Team conferences with you and your family in attendance may also be held.  Expected length of stay: 5-7 Days  Overall anticipated outcome:  MOD I  Depending on your progress and recovery, your program may change. Your Inpatient Rehabilitation Care Coordinator will coordinate services and will keep you informed of any changes. Your Inpatient Rehabilitation Care Coordinator's name and contact numbers are listed  below.  The following services may also be recommended but are not provided by the [facility]:   [place] will be made to provide these services after discharge if needed.  Arrangements include referral to agencies that provide these services.  Your insurance has been verified to be:   [employer] PPO Your primary doctor is:   Otto Herb, MD  Pertinent  information will be shared with your doctor and your insurance company.  Inpatient Rehabilitation Care Coordinator:  Erlene Quan, [place] or [phone]  Information discussed with and copy given to patient by: Dyanne Iha, 07/24/2021, 11:17 AM

## 2021-07-24 NOTE — IPOC Note (Signed)
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     Functional Problem List: Nursing Bowel, Endurance, Pain, Medication Management, Safety  PT Balance, Endurance, Motor, Pain, Safety  OT Edema, Endurance, Motor, Safety, Cognition, Vision, Balance, Sensory, Perception, Nutrition  SLP Cognition  TR         Basic ADLs: OT Eating, Toileting     Advanced  ADLs: OT Full Meal Preparation, Light Housekeeping     Transfers: PT Bed Mobility, Car, Bed to Chair  OT Other (comment) (safety with functional task performance in toileting and bathing.)     Locomotion: PT Ambulation, Stairs, Wheelchair Mobility     Additional Impairments: OT Fuctional Use of Upper Extremity (patient presents with edema associate  with the RUE.  Kinesio tape was applied at the time of the assessment to improve the flow of interstitial fluid.)  SLP Communication, Social Cognition expression Memory  TR      Anticipated Outcomes Item Anticipated Outcome  Self Feeding ModI to I  Swallowing      Basic self-care  Set-upt to [place] with adaptive technique   Bathroom Transfers ModI with RW  Bowel/Bladder  manage bowel with mod I assist  Transfers  minA w/ LRAD  Locomotion  minA w/ LRAD  Communication  Mod I  Cognition  Mod I  Pain  pain at or below level 4 with prn  Safety/Judgment  maintain safety with cues   Therapy Plan: PT Intensity: Minimum of 1-2 x/day ,45 to 90 minutes PT Frequency: 5 out of 7 days PT Duration Estimated Length of Stay: 14 days OT Intensity: Minimum of 1-2 x/day, 45 to 90 minutes OT Frequency: 5 out of 7 days OT Duration/Estimated Length of Stay: 7-10 days SLP Intensity: Minumum of 1-2 x/day, 30 to 90  minutes SLP Frequency: 3 to 5 out of 7 days SLP Duration/Estimated Length of Stay: 7-10 days   Due to the current state of emergency, patients may not be receiving their 3-hours of Medicare-mandated therapy.   Team Interventions: Nursing Interventions Disease Management/Prevention, Medication Management, Discharge Planning, Pain Management, Bowel Management, Patient/Family Education  PT interventions Ambulation/gait training, Training and development officer, Community reintegration, Disease management/prevention, Discharge planning, DME/adaptive equipment instruction, Functional mobility training, Pain management, UE/LE Strength taining/ROM, Visual/perceptual remediation/compensation, UE/LE Coordination activities, Wheelchair propulsion/positioning, Therapeutic Exercise, Therapeutic Activities, Stair training, Patient/family education, Neuromuscular re-education  OT Interventions Neuromuscular re-education, Training and development officer, Self Care/advanced ADL retraining, Therapeutic Exercise, Cognitive remediation/compensation, DME/adaptive equipment instruction, UE/LE Strength taining/ROM, Splinting/orthotics, UE/LE Coordination activities, Visual/perceptual remediation/compensation, Functional mobility training, Disease mangement/prevention, Community reintegration, Technical sales engineer stimulation, Patient/family education, Discharge planning  SLP Interventions Cognitive remediation/compensation, Speech/Language facilitation, English as a second language teacher, Environmental controls, Therapeutic Activities, Functional tasks, Patient/family education  TR Interventions    SW/CM Interventions Discharge Planning, Psychosocial Support, Disease Management/Prevention, Patient/Family Education   Barriers to Discharge MD  Medical stability  Nursing Decreased caregiver support, Home environment access/layout, Medication compliance 1 level ramped entry with disabled spouse (walks with prosthesis/cane); does not drive. Has SS  and cane  PT Decreased caregiver support husband home but has own medical needs and unsure of assist he can provide  OT Other (comments) The pt was unable to verbalize how she would manage community resource secondary to her spouses inability to operate a vehicle.  SLP  SW Insurance for SNF coverage, Decreased caregiver support     Team Discharge Planning: Destination: PT-Home ,OT- Home , SLP-Home Projected Follow-up: PT-Home health PT, OT-  Home health OT, SLP- (TBD) Projected Equipment Needs: PT-To be determined, OT- Rolling walker with 5" wheels, Tub/shower bench, SLP-None recommended by SLP Equipment Details: PT- , OT-  Patient/family involved in discharge planning: PT- Patient, Family member/caregiver,  OT-Patient, SLP-Patient, Family member/caregiver  MD ELOS: 7-10d Medical Rehab Prognosis:  Good Assessment:  61 year old right-handed female with history of diabetes mellitus, hypertension, hyperlipidemia, obesity with BMI 37.28, OSA, question medical compliance.  Per chart review patient lives with spouse.  1 level home ramped entrance.  Independent prior to admission working as a Training and development officer at [employer].  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  Post procedure left side decreased sensation as well as left-sided weakness.  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  No associated hemorrhage or mass-effect.  CT angiogram head and neck no intracranial large vessel occlusion or significant stenosis.  Echocardiogram with ejection fraction of 65 to 70% no wall motion abnormalities grade 1 diastolic dysfunction.  Patient is currently maintained on low-dose aspirin and no plan for Brilinta.  She did  initially need Cleviprex for blood pressure control.  Therapy evaluations completed due to patient decreased functional mobility left side weakness was admitted for a comprehensive rehab program    See Team Conference Notes for weekly updates to the plan of care

## 2021-07-24 NOTE — Plan of Care (Signed)
Problem: RH Balance Goal: LTG: Patient will maintain dynamic sitting balance (OT) Description: LTG:  Patient will maintain dynamic sitting balance with assistance during activities of daily living (OT) 07/24/2021 1418 by Mancel Bale D, OT Flowsheets (Taken 07/24/2021 1418) LTG: Pt will maintain dynamic sitting balance during ADLs with: Independent with assistive device 07/24/2021 1410 by Mancel Bale D, OT Flowsheets (Taken 07/24/2021 1410) LTG: Pt will maintain dynamic sitting balance during ADLs with: Supervision/Verbal cueing 07/24/2021 1403 by Mancel Bale D, OT Flowsheets (Taken 07/24/2021 1403) LTG: Pt will maintain dynamic sitting balance during ADLs with: Independent Note: Patient currently able to maintain good dynamic sit balance for short periods of time secondary to generalize weakness impacting BLE    Problem: Sit to Stand Goal: LTG:  Patient will perform sit to stand in prep for activites of daily living with assistance level (OT) Description: LTG:  Patient will perform sit to stand in prep for activites of daily living with assistance level (OT) 07/24/2021 1412 by Mancel Bale D, OT Flowsheets (Taken 07/24/2021 1412) LTG: PT will perform sit to stand in prep for activites of daily living with assistance level: Independent with assistive device 07/24/2021 1411 by Mancel Bale D, OT Flowsheets (Taken 07/24/2021 1411) LTG: PT will perform sit to stand in prep for activites of daily living with assistance level: Contact Guard/Touching assist   Problem: RH Eating Goal: LTG Patient will perform eating w/assist, cues/equip (OT) Description: LTG: Patient will perform eating with assist, with/without cues using equipment (OT) Flowsheets (Taken 07/24/2021 1411) LTG: Pt will perform eating with assistance level of: Independent with assistive device    Problem: RH Toileting Goal: LTG Patient will perform toileting task (3/3 steps) with assistance level (OT) Description:  LTG: Patient will perform toileting task (3/3 steps) with assistance level (OT)  Flowsheets (Taken 07/24/2021 1412) LTG: Pt will perform toileting task (3/3 steps) with assistance level: Supervision/Verbal cueing   Problem: RH Functional Use of Upper Extremity Goal: LTG Patient will use RT/LT upper extremity as a (OT) Description: LTG: Patient will use right/left upper extremity as a stabilizer/gross assist/diminished/nondominant/dominant level with assist, with/without cues during functional activity (OT) Flowsheets (Taken 07/24/2021 1418) LTG: Use of upper extremity in functional activities: RUE as nondominant level LTG: Pt will use upper extremity in functional activity with assistance level of: Independent   Problem: RH Simple Meal Prep Goal: LTG Patient will perform simple meal prep w/assist (OT) Description: LTG: Patient will perform simple meal prep with assistance, with/without cues (OT). Flowsheets (Taken 07/24/2021 1418) LTG: Pt will perform simple meal prep with assistance level of: Supervision/Verbal cueing   Problem: RH Light Housekeeping Goal: LTG Patient will perform light housekeeping w/assist (OT) Description: LTG: Patient will perform light housekeeping with assistance, with/without cues (OT). Flowsheets (Taken 07/24/2021 1418) LTG: Pt will perform light housekeeping with assistance level of: Supervision/Verbal cueing   Problem: RH Tub/Shower Transfers Goal: LTG Patient will perform tub/shower transfers w/assist (OT) Description: LTG: Patient will perform tub/shower transfers with assist, with/without cues using equipment (OT) Flowsheets (Taken 07/24/2021 1418) LTG: Pt will perform tub/shower stall transfers with assistance level of: Contact Guard/Touching assist   Problem: RH Balance Goal: LTG Patient will maintain dynamic standing with ADLs (OT) Description: LTG:  Patient will maintain dynamic standing balance with assist during activities of daily living (OT)   Flowsheets (Taken 07/24/2021 1422) LTG: Pt will maintain dynamic standing balance during ADLs with: Independent with assistive device   Problem: RH Bathing Goal: LTG Patient will bathe all body parts with assist  levels (OT) Description: LTG: Patient will bathe all body parts with assist levels (OT) Flowsheets (Taken 07/24/2021 1422) LTG: Pt will perform bathing with assistance level/cueing: Supervision/Verbal cueing   Problem: RH Dressing Goal: LTG Patient will perform upper body dressing (OT) Description: LTG Patient will perform upper body dressing with assist, with/without cues (OT). Flowsheets (Taken 07/24/2021 1422) LTG: Pt will perform upper body dressing with assistance level of: Independent Goal: LTG Patient will perform lower body dressing w/assist (OT) Description: LTG: Patient will perform lower body dressing with assist, with/without cues in positioning using equipment (OT) Flowsheets (Taken 07/24/2021 1422) LTG: Pt will perform lower body dressing with assistance level of: Supervision/Verbal cueing

## 2021-07-24 NOTE — Progress Notes (Signed)
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  Past Medical History:  Past Medical History:  Diagnosis Date   Abnormal EKG    LVH with strain   Acid reflux    Asthma    Depression    Diabetes mellitus    A1C over 9   HLD (hyperlipidemia)    Hypertension    Noncompliance    Obesity    Pneumonia    Sleep apnea    Past Surgical History:  Past Surgical History:  Procedure Laterality Date   CARDIAC CATHETERIZATION     CATARACT EXTRACTION Left 06/04/2021   COLONOSCOPY WITH PROPOFOL N/A 04/29/2015   Procedure: COLONOSCOPY WITH PROPOFOL;  Surgeon: Garlan Fair, MD;  Location: [facility];  Service: Endoscopy;  Laterality: N/A;   EYE SURGERY     HYSTEROSCOPY WITH D & C N/A 09/07/2017   Procedure: DILATATION AND CURETTAGE /HYSTEROSCOPY;  Surgeon: Sloan Leiter, MD;  Location: [facility];  Service: Gynecology;  Laterality: N/A;   IR ANGIO INTRA EXTRACRAN SEL COM CAROTID INNOMINATE UNI R MOD SED  06/18/2021   IR ANGIO INTRA EXTRACRAN SEL INTERNAL CAROTID UNI L MOD SED  06/18/2021   IR ANGIO INTRA EXTRACRAN SEL INTERNAL CAROTID UNI R MOD SED  07/20/2021   IR ANGIO VERTEBRAL SEL VERTEBRAL UNI R MOD SED  06/18/2021   IR ANGIOGRAM FOLLOW UP STUDY  07/20/2021   IR CT HEAD LTD  07/20/2021   IR RADIOLOGIST EVAL & MGMT  06/19/2021   IR TRANSCATH/EMBOLIZ  07/20/2021   IR US GUIDE VASC ACCESS RIGHT  06/18/2021   KNEE ARTHROSCOPY W/ MENISCAL REPAIR Right    RADIOLOGY WITH ANESTHESIA N/A 07/20/2021   Procedure: IR WITH ANESTHESIA;  Surgeon: Pedro Earls, MD;  Location: [facility];  Service: Radiology;  Laterality: N/A;   Social History:  reports that she has never smoked. She has never used smokeless tobacco. She reports that she does not  currently use alcohol. She reports that she does not use drugs.  Family / Support Systems Spouse/Significant Other: [name] Other Supports: Tabitha (Sister) Anticipated Caregiver: [name] Ability/Limitations of Caregiver: no physical assistance Caregiver Availability: 24/7 Family Dynamics: support from spouse and family  Social History Preferred language: English Religion: Urania - How often do you need to have someone help you when you read instructions, pamphlets, or other written material from your doctor or pharmacy?: Never Employment Status: Employed Name of Employer: Education officer, museum Return to Work Plans: TBD Public relations account executive Issues: n/a Guardian/Conservator: n/a   Abuse/Neglect Abuse/Neglect Assessment Can Be Completed: Yes Physical Abuse: Denies Verbal Abuse: Denies Sexual Abuse: Denies Exploitation of patient/patient's resources: Denies Self-Neglect: Denies  Patient response to: Social Isolation - How often do you feel lonely or isolated from those around you?: Never  Emotional Status Recent Psychosocial Issues: hx of depression Psychiatric History: hx of depression  Patient / Family Perceptions, Expectations & Goals Pt/Family understanding of illness & functional limitations: yes Premorbid pt/family roles/activities: previously independent overall Anticipated changes in roles/activities/participation: goals of MOD I, spouse and family able to assist if needed with cognitive tasks Pt/family expectations/goals: [place]: None Premorbid Home Care/DME Agencies: Other (Comment) ([employer], [employer], Civil engineer, contracting, Education officer, environmental) Transportation available at discharge: sister able to transport patient Is the  patient able to respond to transportation needs?: Yes In the past 12 months, has lack of transportation kept you from medical appointments or from getting medications?: No In the past 12 months,  has lack of transportation kept you from meetings, work, or from getting things needed for daily living?: No Resource referrals recommended: Neuropsychology  Discharge Planning Living Arrangements: Spouse/significant other Support Systems: Spouse/significant other, Other relatives Type of Residence: Private residence Insurance Resources: Multimedia programmer (specify) Financial Resources: Employment, Secondary school teacher Screen Referred: No Living Expenses: Lives with family Money Management: Patient, Spouse Does the patient have any problems obtaining your medications?: No Home Management: independent Patient/Family Preliminary Plans: spouse able to assist if needed Care Coordinator Barriers to Discharge: Insurance for SNF coverage, Decreased caregiver support Care Coordinator Anticipated Follow Up Needs: HH/OP Expected length of stay: 5-7 Days  Clinical Impression Sw spoke with pt spouse, introduced self, explained role. Provided MR information. No additional questions or concerns.  Dyanne Iha 07/24/2021, 12:01 PM

## 2021-07-24 NOTE — Progress Notes (Signed)
Pt self transferred from chair to bathroom and back to chair. Pt found coming back from bathroom. Pt encouraged to call for help and assistance with toileting needs. Pt smirked and responded "mmm". Chair alarm on, call light in reach.

## 2021-07-24 NOTE — Evaluation (Signed)
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 ° ° °Past Medical History:  °Past Medical History:  °Diagnosis Date  ° Abnormal EKG   ° LVH with strain  ° Acid reflux   ° Asthma   ° Depression   ° Diabetes mellitus   ° A1C over 9  ° HLD (hyperlipidemia)   ° Hypertension   ° Noncompliance   ° Obesity   ° Pneumonia   ° Sleep apnea   ° °Past Surgical History:  °Past Surgical History:  °Procedure Laterality Date  ° CARDIAC CATHETERIZATION    ° CATARACT EXTRACTION Left 06/04/2021  ° COLONOSCOPY WITH PROPOFOL N/A 04/29/2015  ° Procedure: COLONOSCOPY WITH PROPOFOL;  Surgeon: Martin K Johnson, MD;  Location: [facility];  Service: Endoscopy;  Laterality: N/A;  ° EYE SURGERY    ° HYSTEROSCOPY WITH D & C N/A 09/07/2017  ° Procedure: DILATATION AND CURETTAGE /HYSTEROSCOPY;  Surgeon: Davis, Kelly M, MD;  Location: [facility];  Service: Gynecology;  Laterality: N/A;  ° IR ANGIO INTRA EXTRACRAN SEL COM CAROTID INNOMINATE UNI R MOD SED  06/18/2021  ° IR ANGIO INTRA EXTRACRAN SEL INTERNAL CAROTID UNI L MOD SED  06/18/2021  ° IR ANGIO INTRA EXTRACRAN SEL INTERNAL CAROTID UNI R MOD SED  07/20/2021  ° IR ANGIO VERTEBRAL SEL VERTEBRAL UNI R MOD SED  06/18/2021  ° IR ANGIOGRAM FOLLOW UP STUDY  07/20/2021  ° IR CT HEAD LTD  07/20/2021  ° IR RADIOLOGIST EVAL & MGMT  06/19/2021  ° IR TRANSCATH/EMBOLIZ  07/20/2021  ° IR US GUIDE VASC ACCESS RIGHT  06/18/2021  ° KNEE ARTHROSCOPY W/ MENISCAL REPAIR Right   ° RADIOLOGY WITH  ANESTHESIA N/A 07/20/2021  ° Procedure: IR WITH ANESTHESIA;  Surgeon: de Macedo Rodrigues, Katyucia, MD;  Location: [facility];  Service: Radiology;  Laterality: N/A;  ° ° °Assessment & Plan °Clinical Impression: Patient is a 60 y.o. year old female  .right-handed female with history of diabetes mellitus, hypertension, hyperlipidemia, obesity with BMI 37.28, OSA, question medical compliance.  Per chart review patient lives with spouse.  1 level home ramped entrance.  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  Post procedure left side decreased sensation as well as left-sided weakness.  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  No associated hemorrhage or mass-effect.  CT angiogram head and neck no intracranial large vessel occlusion or significant stenosis.  Echocardiogram with ejection fraction of 65 to 70% no wall motion abnormalities grade 1 diastolic dysfunction.  Patient is currently maintained on low-dose aspirin and no plan for Brilinta.  She did initially need Cleviprex for blood pressure control.    ° °Patient currently requires min with basic self-care skills secondary to muscle weakness, decreased cardiorespiratoy endurance, abnormal tone, motor apraxia,   decreased coordination, and decreased motor planning, decreased visual acuity and decreased visual perceptual skills, decreased motor planning, and decreased safety awareness and decreased memory.  Prior to hospitalization, patient could complete BADL with independent . ° °Patient will benefit from skilled intervention to decrease level of assist with basic self-care skills, increase independence with  basic self-care skills, and increase level of independence with iADL prior to discharge home with care partner.  Anticipate patient will require minimal physical assistance and follow up home health. Patient transferred to CIR 07/23/2021 ° °OT - End of Session °Activity Tolerance: Decreased this session °Endurance Deficit: Yes °Endurance Deficit Description: The pt verbalized feeling fatigue mainly with BLE impacting how she intiated task performance resulting in a visible limp,slight  buckling of the knee during functional mobility. °OT Assessment °Rehab Potential (ACUTE ONLY): Good °OT Barriers to Discharge: Other (comments) °OT Barriers to Discharge Comments: The pt was unable to verbalize how she would manage community resource secondary to her spouses inability to operate a vehicle. °OT Patient demonstrates impairments in the following area(s): Edema;Endurance;Motor;Safety;Cognition;Vision;Balance;Sensory;Perception;Nutrition °OT Basic ADL's Functional Problem(s): Eating;Toileting °OT Advanced ADL's Functional Problem(s): Full Meal Preparation;Light Housekeeping °OT Transfers Functional Problem(s): Other (comment) (safety with functional task performance in toileting and bathing.) °OT Additional Impairment(s): Fuctional Use of Upper Extremity (patient presents with edema associate  with the RUE.  Kinesio tape was applied at the time of the assessment to improve the flow of interstitial fluid.) °OT Plan °OT Intensity: Minimum of 1-2 x/day, 45 to 90 minutes °OT Frequency: 5 out of 7 days °OT Duration/Estimated Length of Stay: 7-10 days °OT Treatment/Interventions: Neuromuscular re-education;Balance/vestibular training;Self Care/advanced ADL retraining;Therapeutic Exercise;Cognitive remediation/compensation;DME/adaptive equipment instruction;UE/LE Strength taining/ROM;Splinting/orthotics;UE/LE Coordination activities;Visual/perceptual remediation/compensation;Functional mobility training;Disease  mangement/prevention;Community reintegration;Functional electrical stimulation;Patient/family education;Discharge planning °OT Self Feeding Anticipated Outcome(s): ModI to I °OT Basic Self-Care Anticipated Outcome(s): Set-upt to MinA °OT Toileting Anticipated Outcome(s): Independent with adaptive technique °OT Bathroom Transfers Anticipated Outcome(s): ModI with RW °OT Recommendation °Recommendations for Other Services: Therapeutic Recreation consult °Therapeutic Recreation Interventions: Kitchen group °Patient destination: Home °Follow Up Recommendations: Home health OT °Equipment Recommended: Rolling walker with 5" wheels;Tub/shower bench ° ° °OT Evaluation °Precautions/Restrictions  °Precautions °Precautions: Fall °General °Chart Reviewed: Yes °Additional Pertinent History: Patient husband was present at the time of the eval °Family/Caregiver Present: Yes (Patient's husband was present) °Vital Signs Patient was able to maintain O2 WFL on room air.  ° °Pain Patient indicated pain associated with the RUE of 6 on a 0-10 scale °  °Home Living/Prior Functioning °Home Living °Living Arrangements: Spouse/significant other °Available Help at Discharge: Family °Type of Home: House °Home Access: Ramped entrance °Home Layout: One level °Bathroom Shower/Tub: Walk-in shower °Bathroom Toilet: Handicapped height °Bathroom Accessibility:  (to be assessed) °Additional Comments: pt's husband home 24/7. He has LLE prosthesis, does not work, does not drive, but ambulates with cane and appears cognitively intact. ° Lives With: Spouse °IADL History °Meal Prep Responsibility: Primary °Cleaning Responsibility: Primary °Homemaking Comments: Patient indicated that it is imperative to her everyday living to be able to complete meals and cleaning within the home and community. 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 Patient indicated replacing her glasses) °Ability to See in Adequate Light: 1 Impaired °Patient Visual Report: Blurring of vision °Vision Assessment?: Vision impaired- to be further tested in functional context °Additional Comments: Patient was able to track and converge, however, she presents with challenges for small fine print. °Perception  °Perception: Within Functional Limits °Praxis °Praxis: Impaired °Praxis Impairment Details: Motor planning °Cognition °Overall Cognitive Status: Within Functional Limits for tasks assessed °Arousal/Alertness: Awake/alert °Orientation Level: Person;Place;Situation °Person: Oriented °Place: Oriented °Situation: Oriented °Year: 2023 °Month: January °Day of Week: Correct °Memory: Impaired °Memory Impairment: Decreased short term memory;Decreased recall of new information °Decreased Short Term Memory: Verbal basic °Immediate Memory Recall: Blue °Memory Recall Sock: Not able to recall °Memory Recall Blue: With Cue °Memory Recall Bed: Not able to recall °Attention: Focused;Sustained;Selective °Focused Attention: Appears intact °Sustained Attention: Appears intact °Selective Attention: Impaired °Awareness: Appears intact °Problem Solving: Appears intact °Executive Function: Self Monitoring;Organizing °Organizing: Impaired °Organizing Impairment: Functional basic °Self  Monitoring: Impaired °Self Monitoring Impairment: Functional complex °Behaviors: Restless;Other (comment) (Patient minimize the risk of weakness associated with BLE during functonal mobility and the implication in relation to safety.) °Safety/Judgment: Impaired °Comments: The pt is high functioning, however, she presents with muscle weakness associated with BUE/LE out of context of being safe during functiional performance and risk taking that could result in a fall. °Sensation °Sensation °Light Touch: Impaired Detail °Central sensation comments: The pt reported pain response associated with the RUE secondary to edema 6 on a 0-10 scale °Light Touch Impaired Details: Impaired RUE °Hot/Cold: Not tested °Proprioception: Not tested °Stereognosis: Not tested °Coordination °Gross Motor Movements are Fluid and Coordinated: Yes (Patient able to demonstrate AROM of bilateral UE for dressing task) °Fine Motor Movements are Fluid and Coordinated: Yes (Patient is R hand dominate and is reported as having challenges attempting to manage food items during self- feeding .  Further assessing to determine need for AE, built- up utensil.) °Motor  °Motor °Motor: Ataxia;Motor apraxia;Abnormal postural alignment and control;Hemiplegia;Abnormal tone °Motor - Skilled Clinical Observations: Patient tends to buckle the R knee during functional mobility, which could impact the way she approaches her environment during functional task perfornance.  °Trunk/Postural Assessment  °Cervical Assessment °Cervical Assessment: Within Functional Limits °Thoracic Assessment °Thoracic Assessment: Within Functional Limits °Lumbar Assessment °Lumbar Assessment: Within Functional Limits °Postural Control °Postural Control: Within Functional Limits  °Balance °Balance °Balance Assessed: Yes °Standardized Balance Assessment °Standardized Balance Assessment:  (Patient able to maintain good dynamic standing balance with challeges over time while incorporating a  RW) °Static Sitting Balance °Static Sitting - Level of Assistance: 5: Stand by assistance °Dynamic Sitting Balance °Dynamic Sitting - Balance Support: During functional activity °Dynamic Sitting - Level of Assistance: 4: Min assist °Static Standing Balance °Static Standing - Balance Support: During functional activity °Static Standing - Level of Assistance: 4: Min assist °Dynamic Standing Balance °Dynamic Standing - Balance Support: During functional activity °Dynamic Standing - Level of Assistance: 4: Min assist °Extremity/Trunk Assessment °RUE Assessment °RUE Assessment: Exceptions to WFL °Active Range of Motion (AROM) Comments: Patient able to complete over head task, as well as, task requiring shld abduction, such as threading her arms through over head shirt. °General Strength Comments: 3/5 MMT °RUE Body System: Neuro °Brunstrum levels for arm and hand: Hand;Arm °Brunstrum level   for arm: Stage IV Movement is deviating from synergy °Brunstrum level for hand: Stage V Independence from basic synergies °RUE Tone °RUE Tone: Mild °LUE Assessment °Active Range of Motion (AROM) Comments: Patient able to demonstrate AROM of BLE for donning socks and pants coming from sit to stand.  The pt was able to ambulate for function from the sink area to the restroom using a RW and CGA. ° °Care Tool °Care Tool Self Care °Eating   °  °Eating Assistive Level Comment: built up utensil for grasping secondary to edema and challenges with manipulation.  °Oral Care    °Oral Care Assist Level: Set up assist °   °Bathing   °  °  °  °  °   °Upper Body Dressing(including orthotics)   °What is the patient wearing?: Pull over shirt °  °  °   °Lower Body Dressing (excluding footwear)   °What is the patient wearing?: Pants °  °   °Putting on/Taking off footwear   °What is the patient wearing?: Non-skid slipper socks °  °   °  ° Care Tool Toileting °Toileting activity   °Assist for toileting:  (Not observed at the time of eval, further assessing  needed) °   ° °Care Tool Bed Mobility °Roll left and right activity   °  °   °Sit to lying activity   °Sit to lying assist level: Minimal Assistance - Patient > 75% °   °Lying to sitting on side of bed activity   °Lying to sitting on side of bed assist level: the ability to move from lying on the back to sitting on the side of the bed with no back support.: Minimal Assistance - Patient > 75% °   ° °Care Tool Transfers °Sit to stand transfer   °Sit to stand assist level: Contact Guard/Touching assist °   °Chair/bed transfer   °Chair/bed transfer assist level: Contact Guard/Touching assist °   ° Toilet transfer   °Assist Level: Contact Guard/Touching assist °   ° °Care Tool Cognition ° °Expression of Ideas and Wants Expression of Ideas and Wants: 4. Without difficulty (complex and basic) - expresses complex messages without difficulty and with speech that is clear and easy to understand  °Understanding Verbal and Non-Verbal Content Understanding Verbal and Non-Verbal Content: 4. Understands (complex and basic) - clear comprehension without cues or repetitions °  °Memory/Recall Ability Memory/Recall Ability : Current season;Location of own room;That he or she is in a hospital/hospital unit;Staff names and faces  ° °Refer to Care Plan for Long Term Goals ° °SHORT TERM GOAL WEEK 1 °OT Short Term Goal 1 (Week 1): Patient to demonstrate ModI to MinA  with all simple BADL related task incorporating a  RW for 95% safety during task perforance. ° °Recommendations for other services: Therapeutic Recreation  Kitchen group  ° °Skilled Therapeutic Intervention °Patient seen this date for OT eval to determine need for OT to improve functional outcome with BADL related task in bathing, dressing, grooming , UB strength , NMR, safety awareness, activity tolerance, pt education, AE, and transitional needs.  The pt was instructed in good anatomical positioning  during the execution of safe functional transfers while completing BADL  related task.  The pt was instructed on the benefit of kinesio taping to reduce her incidence fior edema with her R dominate UE, she was also instructed in  positioning as well.  The pt  gained additional insight on how to approach an extremity with generalize   weakness during a BADL related task in dressing to minimize her risk for falls and the best way to initiate the task.The pt agreed with the focus of treatment and the direction of her care towards goal attainment. Patient left in recliner with alarm in place and family in the room at the time and all additional needs addressed.  ° °ADL °ADL °Eating: Modified independent;Set up (Patient would benefit from AE built-up utensils secondary to edema with RUE and challenges with manipulation. further assessing would be beneficial.) °Where Assessed-Eating:  (further assessing would be beneficial.) °Grooming: Setup °Where Assessed-Grooming: Standing at sink °Upper Body Bathing: Not assessed °Where Assessed-Upper Body Bathing:  (not assessed at the time of eval) °Upper Body Dressing: Minimal cueing °Where Assessed-Upper Body Dressing: Edge of bed °Lower Body Dressing: Contact guard;Minimal cueing °Where Assessed-Lower Body Dressing: Edge of bed °Toileting: Not assessed °Where Assessed-Toileting:  (Further assessing necessary) °Toilet Transfer Method: Ambulating °Toilet Transfer Equipment: Raised toilet seat °Tub/Shower Transfer: Contact guard °Tub/Shower Transfer Method: Stand pivot °Tub/Shower Equipment: Walk in shower °Walk-In Shower Transfer: Minimal assistance °Walk-In Shower Transfer Method: Stand pivot °Walk-In Shower Equipment: Shower seat without back °Mobility  °Bed Mobility °Bed Mobility: Sitting - Scoot to Edge of Bed;Sit to Supine;Rolling Left;Supine to Sit °Rolling Right: Contact Guard/Touching assist °Rolling Left: Contact Guard/Touching assist °Supine to Sit: Contact Guard/Touching assist °Sitting - Scoot to Edge of Bed: Contact Guard/Touching assist °Sit  to Supine: Contact Guard/Touching assist °Transfers °Sit to Stand: Contact Guard/Touching assist °Stand to Sit: Contact Guard/Touching assist ° ° °Discharge Criteria: Patient will be discharged from OT if patient refuses treatment 3 consecutive times without medical reason, if treatment goals not met, if there is a change in medical status, if patient makes no progress towards goals or if patient is discharged from hospital. ° °The above assessment, treatment plan, treatment alternatives and goals were discussed and mutually agreed upon: by patient and by family ° °Lorenda D Jackson °07/24/2021, 12:47 PM  °

## 2021-07-24 NOTE — Progress Notes (Signed)
PROGRESS NOTE   Subjective/Complaints:  Per nursing a little irritated about needing to ask to go to bathroom  ROS- Bilateral hand and wrist swelling - improved per pt, no CP, SOB N/V/D Objective:   No results found. Recent Labs    07/24/21 0536  WBC 5.8  HGB 11.1*  HCT 33.4*  PLT 216   Recent Labs    07/24/21 0536  NA 139  K 3.9  CL 107  CO2 25  GLUCOSE 201*  BUN 15  CREATININE 0.76  CALCIUM 9.0    Intake/Output Summary (Last 24 hours) at 07/24/2021 0824 Last data filed at 07/23/2021 1902 Gross per 24 hour  Intake 240 ml  Output --  Net 240 ml        Physical Exam: Vital Signs Blood pressure (!) 157/98, pulse 79, temperature (!) 97.4 F (36.3 C), temperature source Oral, resp. rate 19, height 5\' 6"  (1.676 m), weight 104.5 kg, last menstrual period 08/02/2013, SpO2 (!) 87 %.   General: No acute distress Mood and affect are appropriate Heart: Regular rate and rhythm no rubs murmurs or extra sounds Lungs: Clear to auscultation, breathing unlabored, no rales or wheezes Abdomen: Positive bowel sounds, soft nontender to palpation, nondistended Extremities: No clubbing, cyanosis, or edema Skin: No evidence of breakdown, no evidence of rash Neurologic: Cranial nerves II through XII intact, motor strength is 5/5 in left  deltoid, bicep, tricep, grip, hip flexor, knee extensors, ankle dorsiflexor and plantar flexor 3- RUE, 4/5 RLE  Sensory exam normal sensation to light touch in bilateral upper and lower extremities Fine motor reduced finger to thumb opposition on RIght side  Musculoskeletal: R>L hand and wrist edema bilateral hand and wrist ecchymosis   Assessment/Plan: 1. Functional deficits which require 3+ hours per day of interdisciplinary therapy in a comprehensive inpatient rehab setting. Physiatrist is providing close team supervision and 24 hour management of active medical problems listed  below. Physiatrist and rehab team continue to assess barriers to discharge/monitor patient progress toward functional and medical goals  Care Tool:  Bathing              Bathing assist       Upper Body Dressing/Undressing Upper body dressing        Upper body assist      Lower Body Dressing/Undressing Lower body dressing            Lower body assist       Toileting Toileting    Toileting assist Assist for toileting: Supervision/Verbal cueing     Transfers Chair/bed transfer  Transfers assist           Locomotion Ambulation   Ambulation assist              Walk 10 feet activity   Assist           Walk 50 feet activity   Assist           Walk 150 feet activity   Assist           Walk 10 feet on uneven surface  activity   Assist           Wheelchair  Assist               Wheelchair 50 feet with 2 turns activity    Assist            Wheelchair 150 feet activity     Assist          Blood pressure (!) 157/98, pulse 79, temperature (!) 97.4 F (36.3 C), temperature source Oral, resp. rate 19, height 5\' 6"  (1.676 m), weight 104.5 kg, last menstrual period 08/02/2013, SpO2 (!) 87 %.  Medical Problem List and Plan: 1. 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  Antithrombotics: -DVT/anticoagulation:  Mechanical:  Antiembolism stockings, knee (TED hose) Bilateral lower extremities             -antiplatelet therapy: Aspirin 81 mg daily Hand /wrist swelling s/p bilateral radial art cath  3. Pain Management: Tylenol as needed Mainly weak RUE - has pain and decreased muscular support right wrist will order wrist splint  4. Mood:  Melatonin as needed             -antipsychotic agents: N/A 5. Neuropsych: This patient is capable of making decisions on her own behalf. 6. Skin/Wound Care: Routine skin checks 7. Fluids/Electrolytes/Nutrition: Routine in and outs with follow-up chemistries 8.  Hypertension.   Norvasc 10 mg daily,Cozaar 100 mg daily, Coreg 37.5 mg twice daily.  Vitals:   07/23/21 1955 07/24/21 0327  BP: 124/73 (!) 157/98  Pulse: 73 79  Resp: 18 19  Temp: 98.1 F (36.7 C) (!) 97.4 F (36.3 C)  SpO2: 97% (!) 87%  Monitor on current meds for now   9.  Diabetes mellitus.  Hemoglobin A1c 8.4.  SSI.  Patient on Glucophage 1000 mg daily, Toujeo prior to admission.  Resume as needed CBG (last 3)  Recent Labs    07/23/21 1550 07/23/21 2106 07/24/21 0555  GLUCAP 197* 299* 188*  Start insulin glargine 10U qhs   10.  Hyperlipidemia.  Crestor 11.  Obesity.  BMI 37.28.  Dietary follow-up 12.  Question medical compliance.  Provide counseling 13.  GERD.  Protonix    LOS: 1 days A FACE TO FACE EVALUATION WAS PERFORMED  Charlett Blake 07/24/2021, 8:24 AM

## 2021-07-24 NOTE — Progress Notes (Signed)
Orthopedic Tech Progress Note Patient Details:  Shannon Oliver 09/19/1960 [id number]  Patient ID: Shannon Oliver, female   DOB: May 28, 1961, 61 y.o.   MRN: [id number] Martin Majestic to the floor nurse said patient didn't need wrist splint. Shannon Oliver Shannon Oliver 07/24/2021, 7:17 PM

## 2021-07-25 LAB — GLUCOSE, CAPILLARY
Glucose-Capillary: 179 mg/dL — ABNORMAL HIGH (ref 70–99)
Glucose-Capillary: 165 mg/dL — ABNORMAL HIGH (ref 70–99)
Glucose-Capillary: 145 mg/dL — ABNORMAL HIGH (ref 70–99)
Glucose-Capillary: 178 mg/dL — ABNORMAL HIGH (ref 70–99)

## 2021-07-25 MED ORDER — METFORMIN HCL 500 MG PO TABS
500.0000 mg | ORAL_TABLET | Freq: Every day | ORAL | Status: DC
  Administered 2021-07-26 – 2021-07-27 (×2): 500 mg via ORAL
  Filled 2021-07-25 (×2): qty 1

## 2021-07-25 NOTE — Progress Notes (Signed)
PROGRESS NOTE   Subjective/Complaints: No new complaints Denies pain Tolerating therapy well Legs feel heavy  ROS- Bilateral hand and wrist swelling - improved per pt, denies CP, SOB N/V/D, +heavy feeling in bilateral legs Objective:   CT HEAD WO CONTRAST (5MM)  Result Date: 07/24/2021 CLINICAL DATA:  Severe headache, hypertension, diabetes mellitus EXAM: CT HEAD WITHOUT CONTRAST TECHNIQUE: Contiguous axial images were obtained from the base of the skull through the vertex without intravenous contrast. RADIATION DOSE REDUCTION: This exam was performed according to the departmental dose-optimization program which includes automated exposure control, adjustment of the mA and/or kV according to patient size and/or use of iterative reconstruction technique. COMPARISON:  07/21/2021 FINDINGS: Brain: Normal ventricular morphology. No midline shift or mass effect. Small vessel chronic ischemic changes of deep cerebral white matter. No intracranial hemorrhage, mass lesion, or evidence of acute infarction. No extra-axial fluid collections. Vascular: No hyperdense vessels. Aneurysm coil at LEFT ICA bifurcation again seen. Skull: Intact Sinuses/Orbits: Clear Other: N/A IMPRESSION: Prior aneurysm coiling. Small vessel chronic ischemic changes of deep cerebral white matter. No acute intracranial abnormalities. Electronically Signed   By: Lavonia Dana M.D.   On: 07/24/2021 15:30   Recent Labs    07/24/21 0536  WBC 5.8  HGB 11.1*  HCT 33.4*  PLT 216   Recent Labs    07/24/21 0536  NA 139  K 3.9  CL 107  CO2 25  GLUCOSE 201*  BUN 15  CREATININE 0.76  CALCIUM 9.0    Intake/Output Summary (Last 24 hours) at 07/25/2021 1601 Last data filed at 07/25/2021 1300 Gross per 24 hour  Intake 580 ml  Output --  Net 580 ml        Physical Exam: Vital Signs Blood pressure 110/72, pulse 63, temperature 98.4 F (36.9 C), temperature source Oral,  resp. rate 16, height 5\' 6"  (1.676 m), weight 104.5 kg, last menstrual period 08/02/2013, SpO2 100 %. Gen: no distress, normal appearing HEENT: oral mucosa pink and moist, NCAT Cardio: Reg rate Chest: normal effort, normal rate of breathing Abd: soft, non-distended Extremities: No clubbing, cyanosis, or edema Skin: No evidence of breakdown, no evidence of rash Neurologic: Cranial nerves II through XII intact, motor strength is 5/5 in left  deltoid, bicep, tricep, grip, hip flexor, knee extensors, ankle dorsiflexor and plantar flexor 3- RUE, 4/5 RLE  Sensory exam normal sensation to light touch in bilateral upper and lower extremities Fine motor reduced finger to thumb opposition on RIght side  Musculoskeletal: R>L hand and wrist edema bilateral hand and wrist ecchymosis   Assessment/Plan: 1. Functional deficits which require 3+ hours per day of interdisciplinary therapy in a comprehensive inpatient rehab setting. Physiatrist is providing close team supervision and 24 hour management of active medical problems listed below. Physiatrist and rehab team continue to assess barriers to discharge/monitor patient progress toward functional and medical goals  Care Tool:  Bathing  Bathing activity did not occur:  (not observed at the time of assessment, the pt had completed the task prior to arrival.)           Bathing assist       Upper Body Dressing/Undressing Upper body dressing  What is the patient wearing?: Pull over shirt    Upper body assist Assist Level: Minimal Assistance - Patient > 75%    Lower Body Dressing/Undressing Lower body dressing      What is the patient wearing?: Pants     Lower body assist Assist for lower body dressing: Contact Guard/Touching assist     Toileting Toileting    Toileting assist Assist for toileting: Minimal Assistance - Patient > 75%     Transfers Chair/bed transfer  Transfers assist     Chair/bed transfer assist level: Minimal  Assistance - Patient > 75% (RW to wheelchair)     Locomotion Ambulation   Ambulation assist   Ambulation activity did not occur: Safety/medical concerns  Assist level: Minimal Assistance - Patient > 75% Assistive device: Walker-rolling Max distance: 15'   Walk 10 feet activity   Assist  Walk 10 feet activity did not occur: Safety/medical concerns        Walk 50 feet activity   Assist Walk 50 feet with 2 turns activity did not occur: Safety/medical concerns         Walk 150 feet activity   Assist Walk 150 feet activity did not occur: Safety/medical concerns         Walk 10 feet on uneven surface  activity   Assist Walk 10 feet on uneven surfaces activity did not occur: Safety/medical concerns         Wheelchair     Assist Is the patient using a wheelchair?: No   Wheelchair activity did not occur: Safety/medical concerns         Wheelchair 50 feet with 2 turns activity    Assist    Wheelchair 50 feet with 2 turns activity did not occur: Safety/medical concerns       Wheelchair 150 feet activity     Assist  Wheelchair 150 feet activity did not occur: Safety/medical concerns       Blood pressure 110/72, pulse 63, temperature 98.4 F (36.9 C), temperature source Oral, resp. rate 16, height 5\' 6"  (1.676 m), weight 104.5 kg, last menstrual period 08/02/2013, SpO2 100 %.  Medical Problem List and Plan: 1. 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  Antithrombotics: -DVT/anticoagulation:  Mechanical:  Antiembolism stockings, knee (TED hose) Bilateral lower extremities             -antiplatelet therapy: Aspirin 81 mg daily Hand /wrist swelling s/p bilateral radial art cath  3.  Pain Management: Tylenol as needed Mainly weak RUE - has pain and decreased muscular support right wrist will order wrist splint  4. Mood: Melatonin as needed             -antipsychotic agents: N/A 5. Neuropsych: This patient is capable of making decisions on her own behalf. 6. Skin/Wound Care: Routine skin checks 7. Fluids/Electrolytes/Nutrition: Routine in and outs with follow-up chemistries 8.  Hypertension.   Continue Norvasc 10 mg daily,Cozaar 100 mg daily, Coreg 37.5 mg twice daily.  Vitals:   07/25/21 0846 07/25/21 1336  BP: 130/88 110/72  Pulse: 72 63  Resp:  16  Temp:  98.4 F (36.9 C)  SpO2:  100%  Monitor on current meds for now   9.  Diabetes mellitus.  Hemoglobin A1c 8.4.  SSI.  Patient on Glucophage 1000 mg daily, [place] prior to admission.  Resume as needed CBG (last 3)  Recent Labs    07/24/21 2047 07/25/21 0609 07/25/21 1152  GLUCAP 249* 179* 165*  Start insulin glargine 10U qhs. Restart metformin 500mg  daily.   10.  Hyperlipidemia.  Continue Crestor 11.  Obesity.  BMI 37.28.  Dietary follow-up 12.  Question medical compliance.  Provide counseling 13.  GERD.  Protonix    LOS: 2 days A FACE TO FACE EVALUATION WAS PERFORMED  Clide Deutscher Jere Bostrom 07/25/2021, 4:01 PM

## 2021-07-25 NOTE — Progress Notes (Signed)
Occupational Therapy Session Note  Patient Details  Name: Shannon Oliver MRN: [id number] Date of Birth: [birth year]  Today's Date: 07/25/2021 OT Individual Time: 0906-1006 OT Individual Time Calculation (min): 60 min    Short Term Goals: Week 1:  OT Short Term Goal 1 (Week 1): Patient to demonstrate ModI to MinA  with all simple BADL related task incorporating a  RW for 95% safety during task perforance. OT Short Term Goal 1 - Progress (Week 1): Progressing toward goal  Skilled Therapeutic Interventions/Progress Updates:    Pt completed supine to sit EOB with supervision and no use of the rails.  Supervision for scooting out to the edge.  She was able to stand with min assist using the RW and ambulate to the sink for grooming tasks of washing her face and brushing her teeth.  She then completed min assist transfer to the 3:1 over the toilet as well with completion of toilet hygiene and clothing management.  Therapist next provided wheelchair with transfer down to the ortho gym for work with the [place].  She was able to stand with min assist and no assistive device while using the RUE to complete Visual Scanning Program.  She demonstrated 66% accuracy with an average reaction time of 2.8 seconds on the first interval.  On the second, she had to locate numbers from 1-25 with 89% accuracy and reaction time at 2.4 seconds.  Finished session with return to the room with therapist issuing small foam pieces for her to pick up to work on functional reach and FM coordination.  She was left in wheelchair with safety alarm belt in place and call button and phone in reach.  Spouse also present in the room.    Therapy Documentation Precautions:  Precautions Precautions: Fall Precaution Comments: mild right hemiparesis Restrictions Weight Bearing Restrictions: No  Pain: Pain Assessment Pain Scale: Faces Pain Score: 0-No pain ADL: See Care Tool Section for some details of mobility and  selfcare   Therapy/Group: Individual Therapy  Markeeta Scalf OTR/L 07/25/2021, 12:11 PM

## 2021-07-25 NOTE — Progress Notes (Signed)
Physical Therapy Session Note  Patient Details  Name: Shannon Oliver MRN: [id number] Date of Birth: 07-04-1961  Today's Date: 07/25/2021 PT Individual Time: 1520-1630 PT Individual Time Calculation (min): 70 min   Short Term Goals: Week 1:  PT Short Term Goal 1 (Week 1): Pt will perform bed mobility w/ S. PT Short Term Goal 2 (Week 1): Pt will perform sit to stand w/ minA PT Short Term Goal 3 (Week 1): Pt will perform static standing for 1 min w/ minA PT Short Term Goal 4 (Week 1): Pt will perform 15 ft ambulation w/ LRAD modA  Skilled Therapeutic Interventions/Progress Updates:    Pt received sitting in w/c and agreeable to therapy session though reporting starting to get fatigued.  Transported to/from gym in w/c for time management and energy conservation.   Pt inquiring about the cause of her sudden onset weakness yesterday and requests to discuss the events of her hospitalization. Pt reports immediately after surgery she had L UE and L LE hemiparesis but she now reports feeling as though the strength in that side has improved to ~75% of what it was prior to surgery. However, yesterday afternoon she said when she went to get up from the chair with PT she said it felt like "something heavy was on them" (referring to BOTH B LEs) - states at that time she did not notice a sudden change in R UE strength; however reports she does feel that her R UE is currently weaker than her L UE and states she had severe R hand swelling ~2 days ago that has improved (pt is R handed). Therapist encouraged pt to discuss this with MD on Monday.  Throughout session pt noticed to have R UE and LE weakness compared to L side.  Sit<>stands CGA with or without AD throughout session.  Gait training 155ft using RW with CGA - demos slow gait speed though achieving reciprocal stepping pattern with adequate B LE foot clearance and slight increased postural sway.  Gait training 118ft, no AD, min assist continuing with  slow gait speed; however, increased R/L lateral trunk lean over stance limb likely due to hip weakness, short step lengths with decreased foot clearance bilaterally, and intermittent R knee instability in stance but no buckling.  Stair navigation training ascending/descending 4 steps x2 (6" height) using B HRs with light min assist for balance - reciprocal pattern on ascent and step-to pattern on descent leading with R LE - cuing to ensure full R foot placement up onto step during ascent.  Patient participated in [facility] and demonstrates increased fall risk as noted by score of  22/56.  (<36= high risk for falls, close to 100%; 37-45 significant >80%; 46-51 moderate >50%; 52-55 lower >25%). Educated pt on results of balance assessment and pt reports this helped her truly see her impairments and understand why she needs physical therapy as she did not anticipate she would have trouble with these tasks.   Gait training 70ft, no UE support, with continued gait deviations as noted above with cuing for improvement. Transported back to room and pt agreeable to remain sitting up in w/c - left with needs in reach and seat belt alarm on.  Therapy Documentation Precautions:  Precautions Precautions: Fall Precaution Comments: mild right hemiparesis Restrictions Weight Bearing Restrictions: No  Pain:  Denies pain during session and reports premedicated in preparation for session.   Balance: Standardized Balance Assessment Standardized Balance Assessment: Berg Balance Test Berg Balance Test Sit to Stand:  Able to stand  independently using hands Standing Unsupported: Able to stand 2 minutes with supervision (requires close supervision - minor anterior / posterior sway) Sitting with Back Unsupported but Feet Supported on Floor or Stool: Able to sit 2 minutes under supervision Stand to Sit: Controls descent by using hands Transfers: Able to transfer with verbal cueing and /or  supervision Standing Unsupported with Eyes Closed: Able to stand 10 seconds with supervision Standing Ubsupported with Feet Together: Needs help to attain position and unable to hold for 15 seconds From Standing, Reach Forward with Outstretched Arm: Reaches forward but needs supervision From Standing Position, Pick up Object from Floor: Unable to try/needs assist to keep balance (pt reports not feeling comfortable attempting) From Standing Position, Turn to Look Behind Over each Shoulder: Needs supervision when turning (minimal ability to turn towards R) Turn 360 Degrees: Needs assistance while turning (requires min assist 1 x) Standing Unsupported, Alternately Place Feet on Step/Stool: Needs assistance to keep from falling or unable to try (significant challenge to lift R LE) Standing Unsupported, One Foot in Front: Able to take small step independently and hold 30 seconds Standing on One Leg: Tries to lift leg/unable to hold 3 seconds but remains standing independently Total Score: 22   Therapy/Group: Individual Therapy  Tawana Scale , PT, DPT, NCS, CSRS 07/25/2021, 2:46 PM

## 2021-07-25 NOTE — Progress Notes (Signed)
Speech Language Pathology Daily Session Note  Patient Details  Name: Shannon Oliver MRN: [id number] Date of Birth: 05-18-1961  Today's Date: 07/25/2021 SLP Individual Time: 0800-0840 SLP Individual Time Calculation (min): 40 min  Short Term Goals: Week 1: SLP Short Term Goal 1 (Week 1): STGs=LTGs due to ELOS  Skilled Therapeutic Interventions: Pt seen for skilled ST with focus on cognitive goals, husband present throughout. When entering, pt irritated but stating "I'm trying not to be negative". Pt continues to endorse occasional word finding deficits but states it is improving daily. SLP facilitating word finding exercise providing Supervision A cues and extra time to complete. Pt would occasionally get "stuck" on a word but was able to compensate with Mod I. At end of exercise, pt states it took her a bit longer to complete than her baseline. Left in bed with husband present for needs. Cont ST POC.   Pain Pain Assessment Pain Scale: 0-10 Pain Score: 0-No pain  Therapy/Group: Individual Therapy  Dewaine Conger 07/25/2021, 8:41 AM

## 2021-07-26 LAB — GLUCOSE, CAPILLARY
Glucose-Capillary: 180 mg/dL — ABNORMAL HIGH (ref 70–99)
Glucose-Capillary: 234 mg/dL — ABNORMAL HIGH (ref 70–99)
Glucose-Capillary: 211 mg/dL — ABNORMAL HIGH (ref 70–99)
Glucose-Capillary: 196 mg/dL — ABNORMAL HIGH (ref 70–99)

## 2021-07-27 ENCOUNTER — Inpatient Hospital Stay (HOSPITAL_COMMUNITY): Payer: BC Managed Care – PPO

## 2021-07-27 DIAGNOSIS — I6330 Cerebral infarction due to thrombosis of unspecified cerebral artery: Secondary | ICD-10-CM

## 2021-07-27 LAB — GLUCOSE, CAPILLARY
Glucose-Capillary: 185 mg/dL — ABNORMAL HIGH (ref 70–99)
Glucose-Capillary: 108 mg/dL — ABNORMAL HIGH (ref 70–99)
Glucose-Capillary: 162 mg/dL — ABNORMAL HIGH (ref 70–99)
Glucose-Capillary: 164 mg/dL — ABNORMAL HIGH (ref 70–99)

## 2021-07-27 IMAGING — MR MR HEAD W/O CM
6 of 11 series · 24 of 48 positions shown · non-contrast
Comparison: [DATE] MRI

CLINICAL DATA: Stroke, follow-up

EXAM:
MRI HEAD WITHOUT CONTRAST
TECHNIQUE: Multiplanar, multiecho pulse sequences of the brain and surrounding
structures were obtained without intravenous contrast.

[Series 2: DWI · axial · 3.0mm · 0.94mm/px · z∈[-106,+46]mm · 7 of 108 slices shown (1 of 2)]
[im 1/108]
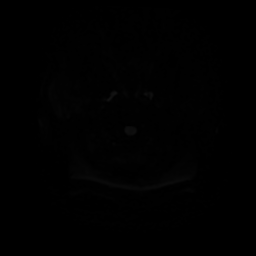
[im 18/108]
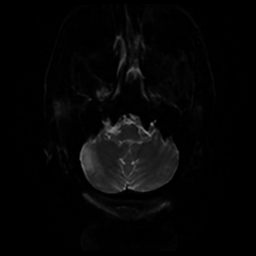
[im 36/108]
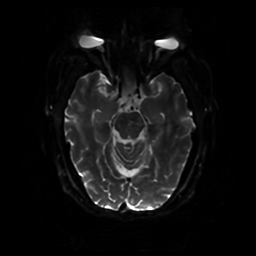
[im 54/108]
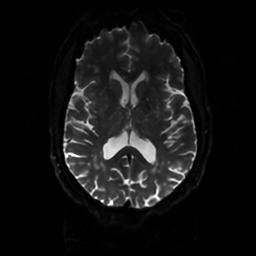
[im 72/108]
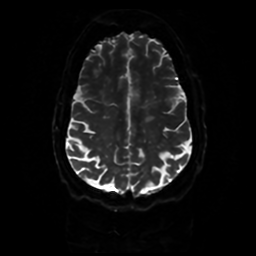
[im 90/108]
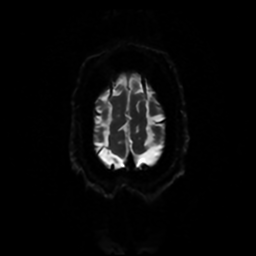
[im 108/108]
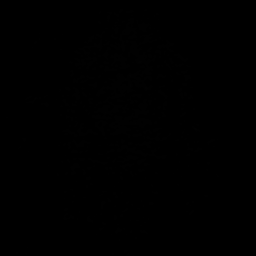

[Series 3: DWI · coronal · 4.0mm · 0.94mm/px · 5 of 80 slices shown (2 of 2)]
[im 1/80]
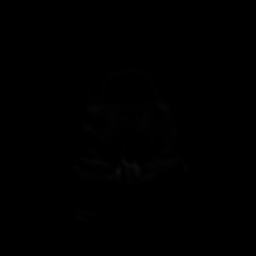
[im 20/80]
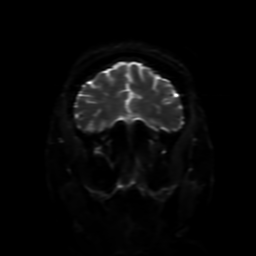
[im 40/80]
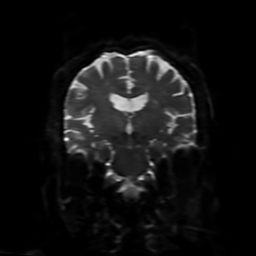
[im 60/80]
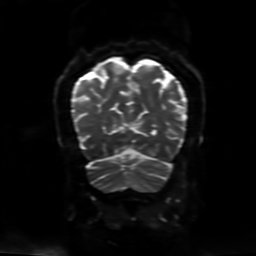
[im 80/80]
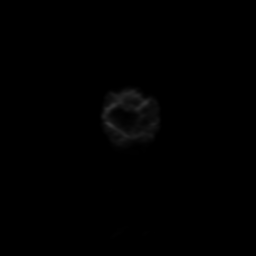

[Series 4: FLAIR · sagittal · 5.0mm · 0.23mm/px · 2 of 27 slices shown (1 of 2)]
[im 1/27]
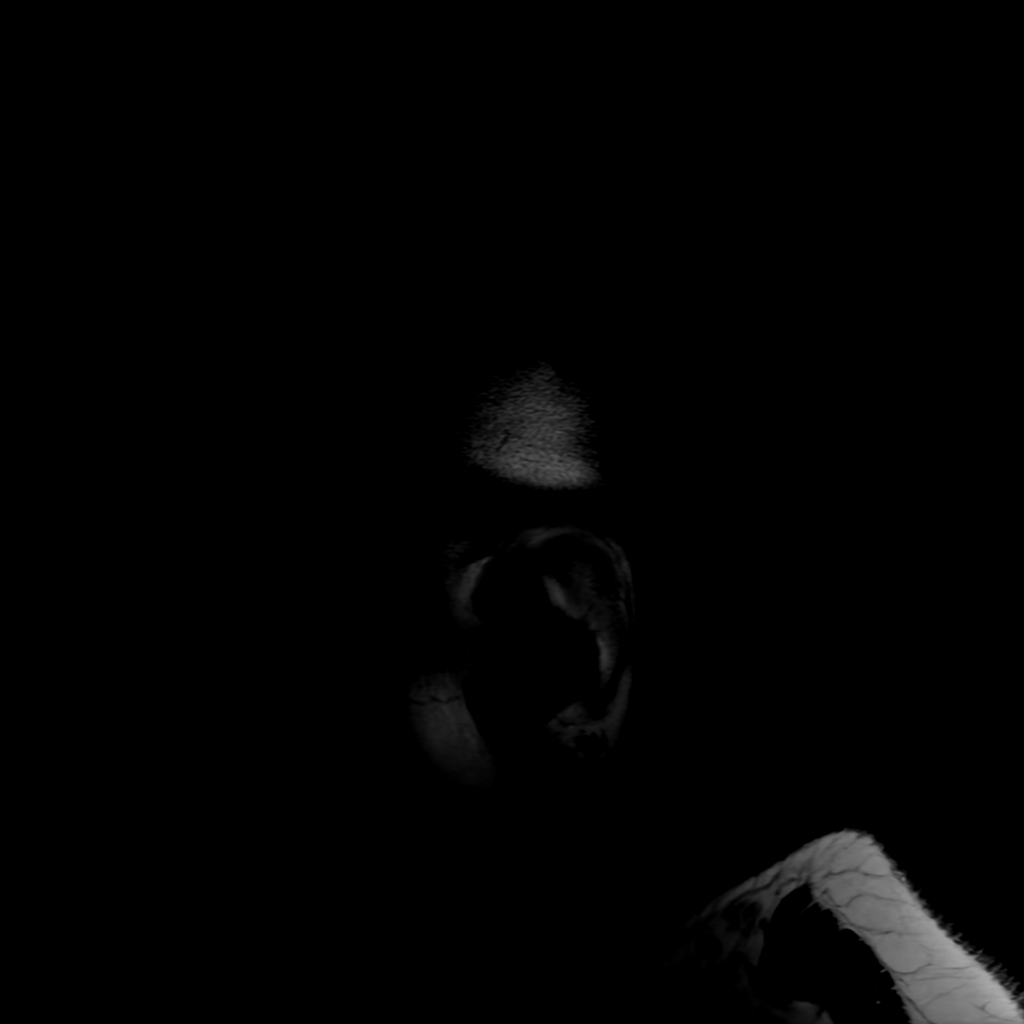
[im 27/27]
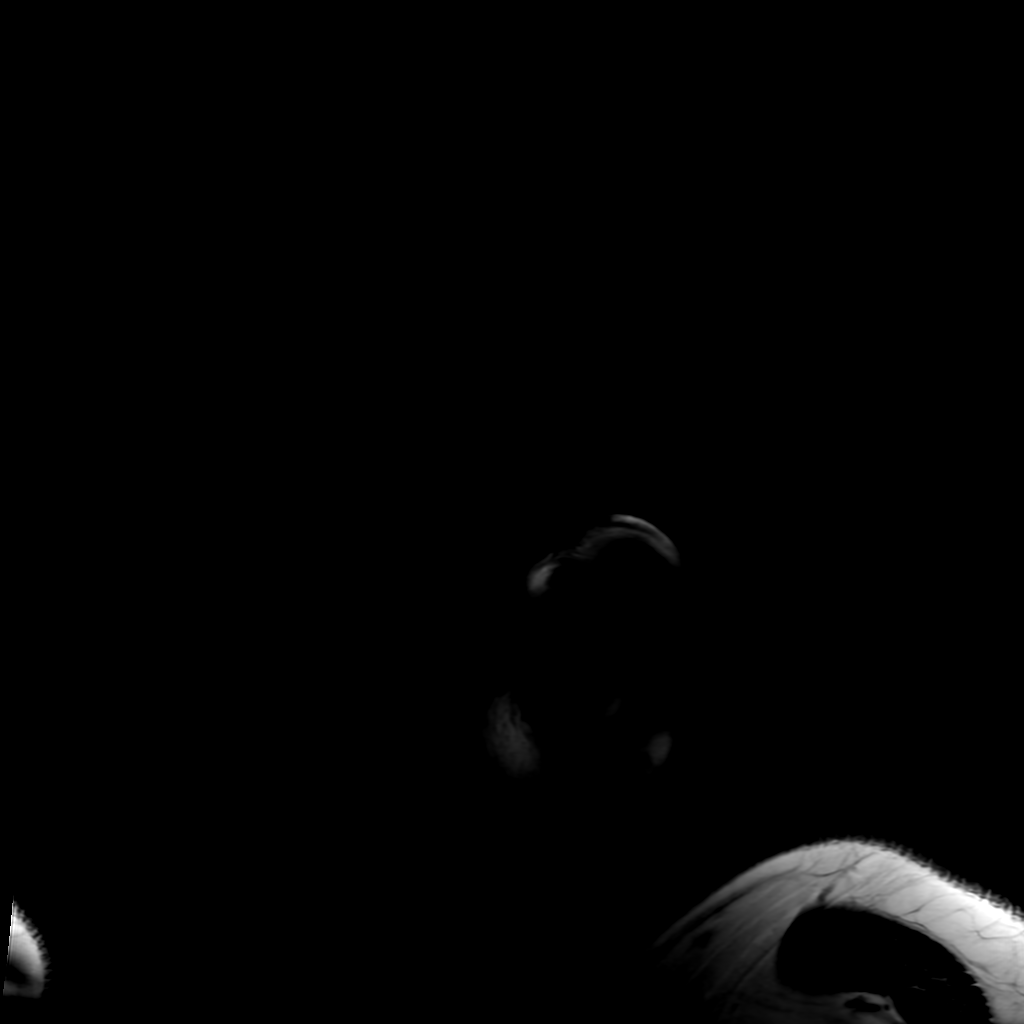

[Series 6: FLAIR · axial · 4.0mm · 0.45mm/px · z∈[-112,+37]mm · 3 of 38 slices shown (2 of 2)]
[im 1/38]
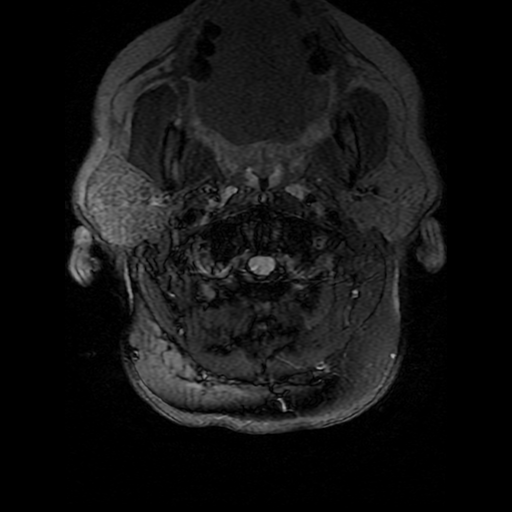
[im 19/38]
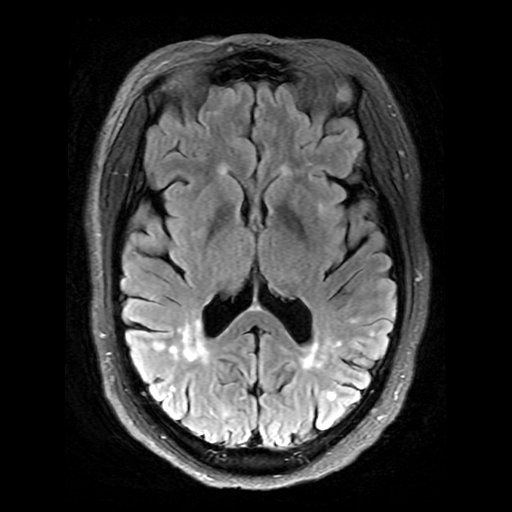
[im 38/38]
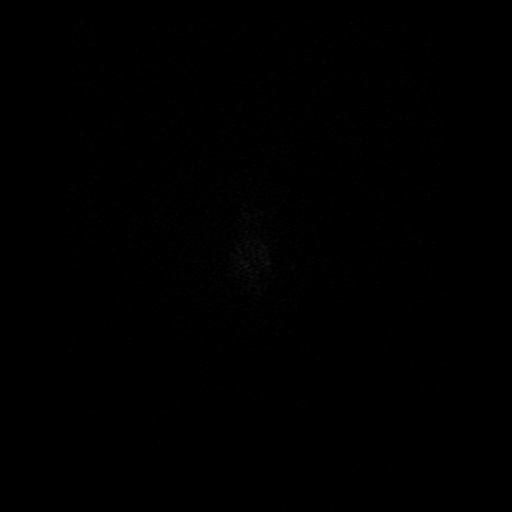

[Series 250: ADC · axial · 3.0mm · 0.94mm/px · z∈[-106,+43]mm · 4 of 54 slices shown (1 of 2)]
[im 1/54]
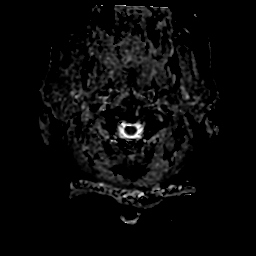
[im 18/54]
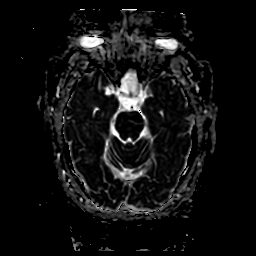
[im 36/54]
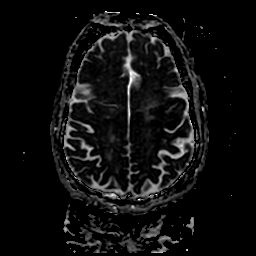
[im 54/54]
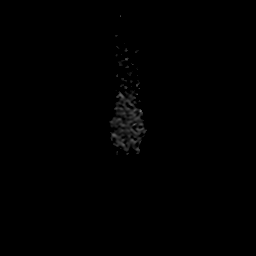

[Series 350: ADC · coronal · 4.0mm · 0.94mm/px · 3 of 39 slices shown (2 of 2)]
[im 1/39]
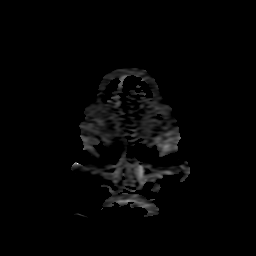
[im 20/39]
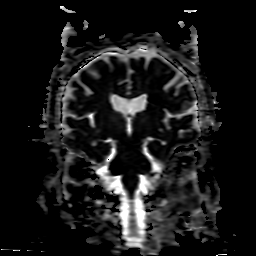
[im 39/39]
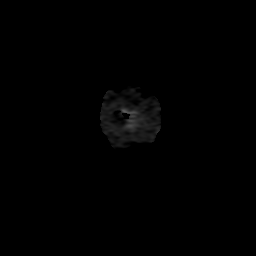

[24 of 48 positions shown; findings below may reference images not displayed]

FINDINGS: Brain: Again noted are multiple punctate foci of restricted
diffusion in the bilateral cerebral hemispheres, with some new foci
of restricted diffusion with ADC correlates: Left occipital lobe
(series 2, image 17; left frontal lobe corona radiata (series 2,
image 39); left body of the caudate (series 2, images 33 and 34).
Additional foci of restricted diffusion correlate with those seen on
the prior exam and are more likely to be subacute infarcts, although
some of them continues to demonstrate diffusion restriction with
persistent ADC correlates, such as the focus in the left external
capsule (series 2, image 25), left occipital lobe (series 2, image
23 and 29), and left corona radiata (series 2, image 37). Other
previously noted infarcts demonstrate pseudonormalization of the
ADC.

No acute hemorrhage, mass, mass effect, or midline shift. Confluent
T2 hyperintense signal in the periventricular white matter, likely
the sequela of moderate to severe chronic small vessel ischemic
disease. No hydrocephalus or extra-axial collection.

Vascular: Normal flow voids.

Skull and upper cervical spine: Normal marrow signal.

Sinuses/Orbits: Mucosal thickening in the ethmoid air cells. Status
post left lens replacement.

Other: The mastoids are well aerated.
IMPRESSION: Punctate foci of restricted diffusion, consistent with acute and
subacute infarcts, with new infarcts, compared to [DATE], seen
in the left occipital lobe, left corona radiata, and left body of
the caudate. Given multiple vascular territories, an embolic
etiology is suspected.

These results will be called to the ordering clinician or
representative by the Radiologist Assistant, and communication
documented in the PACS or [REDACTED].

## 2021-07-27 MED ORDER — METFORMIN HCL 500 MG PO TABS
500.0000 mg | ORAL_TABLET | Freq: Two times a day (BID) | ORAL | Status: DC
  Administered 2021-07-27 – 2021-07-30 (×6): 500 mg via ORAL
  Filled 2021-07-27 (×6): qty 1

## 2021-07-27 NOTE — Progress Notes (Signed)
Occupational Therapy Session Note  Patient Details  Name: Shannon Oliver MRN: [id number] Date of Birth: 06-04-1961  Today's Date: 07/27/2021 OT Individual Time: 1102-1202 OT Individual Time Calculation (min): 60 min    Short Term Goals: Week 1:  OT Short Term Goal 1 (Week 1): Patient to demonstrate ModI to MinA  with all simple BADL related task incorporating a  RW for 95% safety during task perforance. OT Short Term Goal 1 - Progress (Week 1): Progressing toward goal  Skilled Therapeutic Interventions/Progress Updates:    Pt in wheelchair with family present in the room.  Took her down to the therapy gym via wheelchair where she transferred min guard assist to the therapy mat, using the RW for support.  She was able to work on [employer] strengthening tasks during session.  Had her use 2 lb dowel rod for completion of 3 sets of bilateral shoulder flexion for 10 reps at min assist on the right.  She then completed 2 sets of bicep curls as well as overhead press.  She then completed Nine Hole Peg Test for coordination.  She was able to complete in 34 seconds with the LUE and then 48 with the right.  Provided medium resistance red therapy putty with education on RUE strengthening exercises with handout and return demonstration.  She was able to complete gross grasp, tip to tip pinch, 3 jaw chuck, and digit extension for 2-3 reps each.  Encouraged her to work on these exercises daily outside of therapy.  Edema looked better in hand compared to Saturday as well.  Finished session with min assist transfer to the wheelchair and return to the room where she was left with family and NP.  Call button and phone in reach with safety alarm belt in place.     Therapy Documentation Precautions:  Precautions Precautions: Fall Precaution Comments: mild right hemiparesis Restrictions Weight Bearing Restrictions: No  Pain: Pain Assessment Pain Scale: Faces Pain Score: 0-No pain   Therapy/Group: Individual  Therapy  Kamdin Follett OTR/L 07/27/2021, 12:34 PM

## 2021-07-27 NOTE — Progress Notes (Signed)
Physical Therapy Session Note  Patient Details  Name: Shannon Oliver MRN: [id number] Date of Birth: 04-Sep-1960  Today's Date: 07/27/2021 PT Individual Time: 3254-9826; 1300-1340 PT Individual Time Calculation (min): 55 min and 40 mins  Short Term Goals: Week 1:  PT Short Term Goal 1 (Week 1): Pt will perform bed mobility w/ S. PT Short Term Goal 2 (Week 1): Pt will perform sit to stand w/ minA PT Short Term Goal 3 (Week 1): Pt will perform static standing for 1 min w/ minA PT Short Term Goal 4 (Week 1): Pt will perform 15 ft ambulation w/ LRAD modA  Skilled Therapeutic Interventions/Progress Updates:    Session 1: Patient received sitting edge of bed dressing herself, bed alarm off, agreeable to PT. She reports "some pain" though she did not rate. She is unsure if she received pain rx. PT providing rest breaks, distractions and repositioning to assist with pain management. Patient transferring to wc via stand pivot with RW and CGA. PT transporting patient in wc to therapy gym for time management and energy conservation. She ambulated 38ft with RW and CGA. Slow gait speed, R foot everted, L foot toed-in, but not significantly. Patient completing dynamic standing balance tasks: sold ground UE perturbations norbal base, NBOS, semi-tandem; U LE on unsteady surface reciprocal punching with trunk rotation. Noted to have poor balance strategies. Dynamic stepping exercises to work on stepping strategy: stepping to colored dots and progressing to toe tapping to cones. Inadequate weight shift to standing leg resulting in premature weight shift back to mobile leg-> increased instability. Patient reporting R wrist pain with gripping walker, PT to communicate to OT. PT performing gentle effleurage to R hand to assist with edema management. Patient ambulating 55ft x2 with no AD and MinA. General instability with limited trunk engagement noted. Patient returning to room in wc, ambulatory transfer to toilet.  Continent void with supervision for clothing management and perihygiene. Patient remaining up in wc, seatbelt alarm on, call light within reach.   Session 2: Patient received sitting up in wc, agreeable to PT. She denies pain. Patient ambulated ~279ft with RW and CGA/MinA partway to 4th floor therapy gym. She remains with general instability, R trendelenburg and inconsistent step lengths. PT transporting patient remainder of th way. She compelted 4x3 mins on Kinetron 30cm/s. She ambulated 2x23ft with no AD and 1# ball held anteriorly for increased core engagement. MinA provided for this. Patient returning to room in wc, seatbelt alarm on, call light within reach.   Therapy Documentation Precautions:  Precautions Precautions: Fall Precaution Comments: mild right hemiparesis Restrictions Weight Bearing Restrictions: No     Therapy/Group: Individual Therapy  Karoline Caldwell, PT, DPT, CBIS  07/27/2021, 7:48 AM

## 2021-07-27 NOTE — Consult Note (Signed)
Stroke Neurology Consultation Note  Consult Requested by: Dr. Letta Pate  Reason for Consult: episode of BLE weakness  Consult Date: 07/27/21   The history was obtained from the pt and chart.  During history and examination, all items were able to obtain unless otherwise noted.  History of Present Illness:  Shannon Oliver is a 61 y.o. African American female with PMH of diabetes, hypertension, hyperlipidemia, obesity and OSA had left ICA aneurysm coiling with Dr. Norma Fredrickson on 07/20/2021.  Postop in PACU, patient was unable to move left upper and lower extremity against gravity and left neglect, NIHSS 8. Head CT showed no acute findings. Decision made to proceed with TNK. Unclear etiology given intervention on left ICA without right ICA catheterization. 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  However, it is still hard to relate patient left-sided symptoms.  CTA head and neck no significant stenosis, status post left ICA aneurysm coiling, 3 mm right ICA aneurysm.  EF 65 to 70%, LDL 69, A1c 8.4.  Patient was put on aspirin and Brilinta, but later on aspirin only.  Symptoms gradually improved, PT/OT recommend CIR, she was discharged to CIR on 07/23/2021 with aspirin 81 and Crestor 40.  In his CIR, patient working with PT OT continue to improve.  However, on 07/24/2021, patient sitting in chair, however found to have bilateral lower extremity weakness, not able to get up or move bilateral lower extremities, has to use left for bedside commode.  Stat CT no acute finding.  Patient symptoms gradually improved and resolved on 07/25/2021.  However, patient continued to complain of subtle right upper and lower extremity weakness since.  Neurology was called back for further evaluation.  LSN: 07/25/2021 tPA Given: No: out side window  Past Medical History:  Diagnosis Date   Abnormal EKG    LVH with strain   Acid reflux    Asthma    Depression    Diabetes  mellitus    A1C over 9   HLD (hyperlipidemia)    Hypertension    Noncompliance    Obesity    Pneumonia    Sleep apnea     Past Surgical History:  Procedure Laterality Date   CARDIAC CATHETERIZATION     CATARACT EXTRACTION Left 06/04/2021   COLONOSCOPY WITH PROPOFOL N/A 04/29/2015   Procedure: COLONOSCOPY WITH PROPOFOL;  Surgeon: Garlan Fair, MD;  Location: [facility];  Service: Endoscopy;  Laterality: N/A;   EYE SURGERY     HYSTEROSCOPY WITH D & C N/A 09/07/2017   Procedure: DILATATION AND CURETTAGE /HYSTEROSCOPY;  Surgeon: Sloan Leiter, MD;  Location: [facility];  Service: Gynecology;  Laterality: N/A;   IR ANGIO INTRA EXTRACRAN SEL COM CAROTID INNOMINATE UNI R MOD SED  06/18/2021   IR ANGIO INTRA EXTRACRAN SEL INTERNAL CAROTID UNI L MOD SED  06/18/2021   IR ANGIO INTRA EXTRACRAN SEL INTERNAL CAROTID UNI R MOD SED  07/20/2021   IR ANGIO VERTEBRAL SEL VERTEBRAL UNI R MOD SED  06/18/2021   IR ANGIOGRAM FOLLOW UP STUDY  07/20/2021   IR CT HEAD LTD  07/20/2021   IR RADIOLOGIST EVAL & MGMT  06/19/2021   IR TRANSCATH/EMBOLIZ  07/20/2021   IR US GUIDE VASC ACCESS RIGHT  06/18/2021   KNEE ARTHROSCOPY W/ MENISCAL REPAIR Right    RADIOLOGY WITH ANESTHESIA N/A 07/20/2021   Procedure: IR WITH ANESTHESIA;  Surgeon: Pedro Earls, MD;  Location: [facility];  Service: Radiology;  Laterality: N/A;    Family History  Problem Relation Age of Onset   Cancer Mother    Hypertension Mother    Diabetes Mother    Breast cancer Maternal Aunt    Breast cancer Cousin     Social History:  reports that she has never smoked. She has never used smokeless tobacco. She reports that she does not currently use alcohol. She reports that she does not use drugs.  Allergies:  Allergies  Allergen Reactions   Canagliflozin     dizziness, stabbing pain in right side of body, tolerates low dose   Hydrocodone Hypertension    No current facility-administered medications on file  prior to encounter.   Current Outpatient Medications on File Prior to Encounter  Medication Sig Dispense Refill   Accu-Chek Softclix Lancets lancets Use to check blood sugar twice daily. 100 each 3   acetaminophen (TYLENOL) 500 MG tablet Take 1,000 mg by mouth every 4 (four) hours as needed for moderate pain.     amLODipine (NORVASC) 10 MG tablet TAKE 1 TABLET EVERY DAY ONCE A DAY ORALLY 90 30 tablet 1   aspirin EC 81 MG EC tablet Take 1 tablet (81 mg total) by mouth daily. Swallow whole. 30 tablet 11   benzonatate (TESSALON) 100 MG capsule Take 1 capsule (100 mg total) by mouth every 8 (eight) hours. (Patient not taking: Reported on 07/16/2021) 21 capsule 0   carvedilol (COREG) 25 MG tablet Take 37.5 mg by mouth 2 (two) times daily with a meal.     Continuous Blood Gluc Sensor (DEXCOM G6 SENSOR) MISC Use to monitor blood sugar, change after 10 days 3 each 3   FARXIGA 5 MG TABS tablet TAKE 1 TABLET (5 MG TOTAL) BY MOUTH DAILY. 30 tablet 1   glucose blood (ACCU-CHEK GUIDE) test strip Use as instructed to check blood sugar twice daily. 100 each 12   ibuprofen (ADVIL) 200 MG tablet Take 400 mg by mouth every 4 (four) hours as needed for moderate pain.     insulin aspart (FIASP) 100 UNIT/ML FlexTouch Pen Inject 8 Units into the skin 3 (three) times daily before meals. 15 mL 1   insulin glargine, 1 Unit Dial, (TOUJEO SOLOSTAR) 300 UNIT/ML Solostar Pen Adjust as directed 3 mL 1   Insulin Syringe-Needle U-100 30G X 1/2" 0.3 ML MISC Use twice a day with insulin 100 each 0   losartan (COZAAR) 100 MG tablet Take 100 mg by mouth daily.     MELATONIN PO Take 1 capsule by mouth at bedtime as needed (sleep).     metFORMIN (GLUCOPHAGE) 1000 MG tablet Take 1,000 mg by mouth daily.     Multiple Vitamins-Calcium (ONE-A-DAY WOMENS PO) Take 1 tablet by mouth daily.     oxyCODONE-acetaminophen (PERCOCET/ROXICET) 5-325 MG tablet Take 1 tablet by mouth every 6 (six) hours as needed for up to 5 doses for severe pain.  (Patient not taking: Reported on 07/09/2021) 5 tablet 0   potassium chloride SA (KLOR-CON M20) 20 MEQ tablet Take 1 tablet (20 mEq total) by mouth daily. (Patient not taking: Reported on 07/09/2021) 30 tablet 1   prednisoLONE acetate (PRED FORTE) 1 % ophthalmic suspension Place 1 drop into the left eye daily.     promethazine (PHENERGAN) 12.5 MG tablet Take 12.5 mg by mouth every 6 (six) hours as needed for nausea or vomiting.     rosuvastatin (CRESTOR) 40 MG tablet Take 40 mg by mouth daily.     Sodium Chloride, Hypertonic, (MURO  774 OP) Place 1 application into the left eye at bedtime.     SODIUM CHLORIDE, HYPERTONIC, OP Place 1 drop into the left eye daily as needed (dry eyes/irritation).     spironolactone (ALDACTONE) 25 MG tablet Take 1 tablet by mouth daily.      Review of Systems: A full ROS was attempted today and was able to be performed.  Systems assessed include - Constitutional, Eyes, HENT, Respiratory, Cardiovascular, Gastrointestinal, Genitourinary, Integument/breast, Hematologic/lymphatic, Musculoskeletal, Neurological, Behavioral/Psych, Endocrine, Allergic/Immunologic - with pertinent responses as per HPI.  Physical Examination: Temp:  [97.8 F (36.6 C)-98.1 F (36.7 C)] 98.1 F (36.7 C) (01/23 1931) Pulse Rate:  [72-75] 72 (01/23 1931) Resp:  [18-19] 18 (01/23 1931) BP: (124-156)/(60-78) 124/60 (01/23 1931) SpO2:  [98 %-100 %] 100 % (01/23 1931)  General - well nourished, well developed, in no apparent distress.    Ophthalmologic - fundi not visualized due to noncooperation.    Cardiovascular - regular rhythm and rate  Mental Status -  Level of arousal and orientation to time, place, and person were intact. Language including expression, naming, repetition, comprehension was assessed and found intact. Fund of Knowledge was assessed and was intact.  Cranial Nerves II - XII - II - Vision intact OU. III, IV, VI - Extraocular movements intact. V - Facial sensation intact  bilaterally. VII - Facial movement intact bilaterally. VIII - Hearing & vestibular intact bilaterally. X - Palate elevates symmetrically. XI - Chin turning & shoulder shrug intact bilaterally. XII - Tongue protrusion intact.  Motor Strength - The patients strength was normal in all extremities and pronator drift was absent except subtle right UE proximal 4+/5.   Motor Tone & Bulk - Muscle tone was assessed at the neck and appendages and was normal.  Bulk was normal and fasciculations were absent.   Reflexes - The patients reflexes were normal in all extremities and she had no pathological reflexes.  Sensory - Light touch, temperature/pinprick were assessed and were normal.    Coordination - The patient had normal movements in the hands with no ataxia or dysmetria.  Tremor was absent.  Gait and Station - deferred  Data Reviewed: CT HEAD WO CONTRAST (5MM)  Result Date: 07/24/2021 CLINICAL DATA:  Severe headache, hypertension, diabetes mellitus EXAM: CT HEAD WITHOUT CONTRAST TECHNIQUE: Contiguous axial images were obtained from the base of the skull through the vertex without intravenous contrast. RADIATION DOSE REDUCTION: This exam was performed according to the departmental dose-optimization program which includes automated exposure control, adjustment of the mA and/or kV according to patient size and/or use of iterative reconstruction technique. COMPARISON:  07/21/2021 FINDINGS: Brain: Normal ventricular morphology. No midline shift or mass effect. Small vessel chronic ischemic changes of deep cerebral white matter. No intracranial hemorrhage, mass lesion, or evidence of acute infarction. No extra-axial fluid collections. Vascular: No hyperdense vessels. Aneurysm coil at LEFT ICA bifurcation again seen. Skull: Intact Sinuses/Orbits: Clear Other: N/A IMPRESSION: Prior aneurysm coiling. Small vessel chronic ischemic changes of deep cerebral white matter. No acute intracranial abnormalities.  Electronically Signed   By: Lavonia Dana M.D.   On: 07/24/2021 15:30   CT HEAD WO CONTRAST (5MM)  Result Date: 07/21/2021 CLINICAL DATA:  Stroke.  TNK follow-up EXAM: CT HEAD WITHOUT CONTRAST TECHNIQUE: Contiguous axial images were obtained from the base of the skull through the vertex without intravenous contrast. RADIATION DOSE REDUCTION: This exam was performed according to the departmental dose-optimization program which includes automated exposure control, adjustment of the mA and/or kV  according to patient size and/or use of iterative reconstruction technique. COMPARISON:  MRI head 07/21/2021.  CT head 07/20/2021 FINDINGS: Brain: Negative for acute hemorrhage. Ventricle size normal. Patchy white matter hypodensity bilaterally Small areas of acute infarct identified on diffusion-weighted imaging are not well delineated on CT. Vascular: Negative for hyperdense vessel. Prior coiling of the terminal left ICA aneurysm. Skull: Negative Sinuses/Orbits: Mild mucosal edema paranasal sinuses. No orbital lesion. Left cataract extraction. Other: None IMPRESSION: 1. No acute intracranial hemorrhage 2. Diffuse white matter hypodensity compatible with ischemia. Small areas of restricted diffusion best seen on MRI today. Electronically Signed   By: Franchot Gallo M.D.   On: 07/21/2021 13:40   MR BRAIN WO CONTRAST  Result Date: 07/21/2021 CLINICAL DATA:  61 year old female status post endovascular embolization of left ICA terminus aneurysm with web device. Neurologic deficit. EXAM: MRI HEAD WITHOUT CONTRAST TECHNIQUE: Multiplanar, multiecho pulse sequences of the brain and surrounding structures were obtained without intravenous contrast. COMPARISON:  CT head without contrast and CTA head and neck 07/20/2021. FINDINGS: Brain: There are scattered small - generally punctate - foci of diffusion restriction in both cerebral hemispheres. Most are in the left hemisphere involving the left MCA and left PCA territory. Among the  most conspicuous is a 6 mm focus at the left external capsule abutting the lentiform (series 5, image 87 and series 7, image 70). Small involvement also at the right genu of the corpus callosum (right ACA territory). Occasional right MCA involvement. Subtle T2 and FLAIR hyperintensity associated with the acute findings. With moderate superimposed widely scattered background white matter T2 and FLAIR hyperintensity. And mild to moderate associated heterogeneity in the bilateral basal ganglia. But no cortical encephalomalacia identified. Trace if any chronic cerebral blood products. Mild susceptibility artifact at the left ICA terminus related to endovascular embolization. No restricted diffusion to suggest acute infarction. No midline shift, mass effect, evidence of mass lesion, ventriculomegaly, extra-axial collection or acute intracranial hemorrhage. Vascular: Major intracranial vascular flow voids are preserved. Skull and upper cervical spine: Negative visible cervical spine. Visualized bone marrow signal is within normal limits. Sinuses/Orbits: Postoperative changes to the left globe. Otherwise negative orbits. Paranasal Visualized paranasal sinuses and mastoids are stable and well aerated. Other: Mastoids are well aerated.  Negative visible scalp and face. IMPRESSION: 1. 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 Among the most conspicuous is a 6 mm lacune in the left external capsule abutting the lentiform. No associated hemorrhage or mass effect. 2. Underlying moderate for age signal changes in the brain compatible with chronic small vessel disease. Electronically Signed   By: Genevie Ann M.D.   On: 07/21/2021 05:30   IR Transcath/Emboliz  Result Date: 07/20/2021 INDICATION: Maiya Kates Milhoan is a 61 year old female with past medical history significant for diabetes, hyperlipidemia, hypertension and cataract. She presented to the emergency room with headache on  06/05/2021. 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 She underwent a diagnostic cerebral angiogram on 06/19/2021 that confirmed the presence of the 2 brain aneurysms seen on prior CTA. She comes to our service today for elective treatment of her left ICA aneurysm. In preparation to today's procedure, she was started on Brilinta 90 mg b.i.d. and aspirin 81 mg q.d. EXAM: ULTRASOUND-GUIDED VASCULAR ACCESS DIAGNOSTIC CEREBRAL ANGIOGRAM ENDOVASCULAR ANEURYSM EMBOLIZATION FLAT PANEL HEAD CT COMPARISON:  Cerebral angiogram June 18, 2021. MEDICATIONS: Ancef 2 g IV.  The antibiotic was administered within 1 hour of the procedure. ANESTHESIA/SEDATION: The study was performed in the general anesthesia. CONTRAST:  106 cc of Omnipaque 300 milligram/mL FLUOROSCOPY TIME:  Fluoroscopy Time: 42 minutes 48 seconds (1,843 mGy). COMPLICATIONS: Delayed - approximately one hour after procedure, patient developed left sided weakness, unclear whether procedure related given intervention in the left ICA. TECHNIQUE: Informed written consent was obtained from the patient after a thorough discussion of the procedural risks, benefits and alternatives. All questions were addressed. Maximal Sterile Barrier Technique was utilized including caps, mask, sterile gowns, sterile gloves, sterile drape, hand hygiene and skin antiseptic. A timeout was performed prior to the initiation of the procedure. Using the modified Seldinger technique and a micropuncture kit, access was gained to the distal right radial artery at the anatomical snuffbox and a 7 French sheath was placed. Real-time ultrasound guidance was utilized for vascular access including the acquisition of a permanent ultrasound image documenting patency of the accessed vessel. Slow intra arterial infusion of 5,000 IU heparin, 5 mg Verapamil and 200 mcg nitroglycerin diluted in patient's own blood was performed.  No significant fluctuation in patient's blood pressure seen. Then, a right radial artery angiogram was obtained via sheath side port. Next, a 5 [place] Simmons 2 glide catheter was navigated over a 0.035" Terumo Glidewire into the right subclavian artery under fluoroscopic guidance. The catheter was then advanced into the left common carotid artery. Frontal and lateral angiograms of the neck were obtained. Using biplane roadmap guidance, the catheter was then advanced into the left internal carotid artery. Frontal, lateral, magnified waters and magnified lateral views of the head were obtained. FINDINGS: 1. Normal brachial artery branching pattern seen. No significant anatomical variation. The right radial artery caliber is adequate for vascular access. 2. 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 3. There is brisk vascular contrast filling of the bilateral ACA and left MCA vascular trees. The visualized dural sinuses are patent. PROCEDURE: The Simmons 2 glide catheter was exchanged over the wire and under biplane roadmap for a 7 [place] Rist catheter which was placed in the distal cervical segment of the left ICA. Magnified frontal and lateral angiograms of the head were obtained in the working projections. Then, a [place] EX intermediate catheter was navigated through the risk catheter and over a via 17 microcatheter and a synchro 2 micro guidewire into the cavernous segment of the right ICA. Magnified frontal and lateral angiograms of the head were obtained in the working projections. The via microcatheter was then navigated over the wire into the left ICA terminus aneurysm pouch. The wire was removed. Then, a 4 x 2 mm web SL device was deployed into the aneurysm pouch. Frontal and lateral magnified angiograms of the head were obtained in the working projections. Adequate positioning of the device was noted. The device was then detached under fluoroscopy. Follow-up  left internal carotid artery angiograms with magnified frontal and lateral views of the head in the working projections showed stable position of the device within the aneurysm pouch. Left ICA angiograms with frontal and lateral views of the entire head were then obtained, showed no evidence of thromboembolic complication. Flat panel CT of the head was obtained and post processed in a separate workstation with concurrent attending physician supervision. Selected images were sent to PACS. No evidence of hemorrhagic complication noted. The catheter was subsequently withdrawn. An inflatable band was placed and inflated over the right hand access site. The  vascular sheath was withdrawn and the band was slowly deflated until brisk flow was noted through the arteriotomy site. At this point, the band was reinflated with additional 2 cc of air to obtain patent hemostasis. 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 No evidence of hemorrhagic of thromboembolic complication. PLAN: Patient will be admitted to ICU for observation. Electronically Signed   By: Pedro Earls M.D.   On: 07/20/2021 15:53   IR Angiogram Follow Up Study  Result Date: 07/20/2021 INDICATION: Gracynn Rajewski Goldberg is a 61 year old female with past medical history significant for diabetes, hyperlipidemia, hypertension and cataract. She presented to the emergency room with headache on 06/05/2021. 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 She underwent a diagnostic cerebral angiogram on 06/19/2021 that confirmed the presence of the 2 brain aneurysms seen on prior CTA. She comes to our service today for elective treatment of her left ICA aneurysm. In preparation to today's procedure, she was started on Brilinta 90 mg b.i.d. and aspirin 81 mg q.d. EXAM: ULTRASOUND-GUIDED VASCULAR ACCESS DIAGNOSTIC CEREBRAL  ANGIOGRAM ENDOVASCULAR ANEURYSM EMBOLIZATION FLAT PANEL HEAD CT COMPARISON:  Cerebral angiogram June 18, 2021. MEDICATIONS: Ancef 2 g IV. The antibiotic was administered within 1 hour of the procedure. ANESTHESIA/SEDATION: The study was performed in the general anesthesia. CONTRAST:  106 cc of Omnipaque 300 milligram/mL FLUOROSCOPY TIME:  Fluoroscopy Time: 42 minutes 48 seconds (1,843 mGy). COMPLICATIONS: Delayed - approximately one hour after procedure, patient developed left sided weakness, unclear whether procedure related given intervention in the left ICA. TECHNIQUE: Informed written consent was obtained from the patient after a thorough discussion of the procedural risks, benefits and alternatives. All questions were addressed. Maximal Sterile Barrier Technique was utilized including caps, mask, sterile gowns, sterile gloves, sterile drape, hand hygiene and skin antiseptic. A timeout was performed prior to the initiation of the procedure. Using the modified Seldinger technique and a micropuncture kit, access was gained to the distal right radial artery at the anatomical snuffbox and a 7 French sheath was placed. Real-time ultrasound guidance was utilized for vascular access including the acquisition of a permanent ultrasound image documenting patency of the accessed vessel. Slow intra arterial infusion of 5,000 IU heparin, 5 mg Verapamil and 200 mcg nitroglycerin diluted in patient's own blood was performed. No significant fluctuation in patient's blood pressure seen. Then, a right radial artery angiogram was obtained via sheath side port. Next, a 5 [place] Simmons 2 glide catheter was navigated over a 0.035" Terumo Glidewire into the right subclavian artery under fluoroscopic guidance. The catheter was then advanced into the left common carotid artery. Frontal and lateral angiograms of the neck were obtained. Using biplane roadmap guidance, the catheter was then advanced into the left internal carotid  artery. Frontal, lateral, magnified waters and magnified lateral views of the head were obtained. FINDINGS: 1. Normal brachial artery branching pattern seen. No significant anatomical variation. The right radial artery caliber is adequate for vascular access. 2. 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 3. There is brisk vascular contrast filling of the bilateral ACA and left MCA vascular trees. The visualized dural sinuses are patent. PROCEDURE: The Simmons 2 glide catheter was exchanged over the wire and under biplane roadmap for a 7 [place] Rist catheter which was placed in the distal cervical segment of the left  ICA. Magnified frontal and lateral angiograms of the head were obtained in the working projections. Then, a [place] EX intermediate catheter was navigated through the risk catheter and over a via 17 microcatheter and a synchro 2 micro guidewire into the cavernous segment of the right ICA. Magnified frontal and lateral angiograms of the head were obtained in the working projections. The via microcatheter was then navigated over the wire into the left ICA terminus aneurysm pouch. The wire was removed. Then, a 4 x 2 mm web SL device was deployed into the aneurysm pouch. Frontal and lateral magnified angiograms of the head were obtained in the working projections. Adequate positioning of the device was noted. The device was then detached under fluoroscopy. Follow-up left internal carotid artery angiograms with magnified frontal and lateral views of the head in the working projections showed stable position of the device within the aneurysm pouch. Left ICA angiograms with frontal and lateral views of the entire head were then obtained, showed no evidence of thromboembolic complication. Flat panel CT of the head was obtained and post processed in a separate workstation with concurrent attending physician supervision. Selected images were sent to PACS. No evidence  of hemorrhagic complication noted. The catheter was subsequently withdrawn. An inflatable band was placed and inflated over the right hand access site. The vascular sheath was withdrawn and the band was slowly deflated until brisk flow was noted through the arteriotomy site. At this point, the band was reinflated with additional 2 cc of air to obtain patent hemostasis. 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 No evidence of hemorrhagic of thromboembolic complication. PLAN: Patient will be admitted to ICU for observation. Electronically Signed   By: Pedro Earls M.D.   On: 07/20/2021 15:53   IR CT Head Ltd  Result Date: 07/20/2021 INDICATION: Maymuna Detzel Lievanos is a 61 year old female with past medical history significant for diabetes, hyperlipidemia, hypertension and cataract. She presented to the emergency room with headache on 06/05/2021. 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 She underwent a diagnostic cerebral angiogram on 06/19/2021 that confirmed the presence of the 2 brain aneurysms seen on prior CTA. She comes to our service today for elective treatment of her left ICA aneurysm. In preparation to today's procedure, she was started on Brilinta 90 mg b.i.d. and aspirin 81 mg q.d. EXAM: ULTRASOUND-GUIDED VASCULAR ACCESS DIAGNOSTIC CEREBRAL ANGIOGRAM ENDOVASCULAR ANEURYSM EMBOLIZATION FLAT PANEL HEAD CT COMPARISON:  Cerebral angiogram June 18, 2021. MEDICATIONS: Ancef 2 g IV. The antibiotic was administered within 1 hour of the procedure. ANESTHESIA/SEDATION: The study was performed in the general anesthesia. CONTRAST:  106 cc of Omnipaque 300 milligram/mL FLUOROSCOPY TIME:  Fluoroscopy Time: 42 minutes 48 seconds (1,843 mGy). COMPLICATIONS: Delayed - approximately one hour after procedure, patient developed left sided weakness, unclear whether  procedure related given intervention in the left ICA. TECHNIQUE: Informed written consent was obtained from the patient after a thorough discussion of the procedural risks, benefits and alternatives. All questions were addressed. Maximal Sterile Barrier Technique was utilized including caps, mask, sterile gowns, sterile gloves, sterile drape, hand hygiene and skin antiseptic. A timeout was performed prior to the initiation of the procedure. Using the modified Seldinger technique and a micropuncture kit, access was gained to the distal right radial artery at the anatomical snuffbox and a 7 French sheath was placed. Real-time ultrasound guidance was  utilized for vascular access including the acquisition of a permanent ultrasound image documenting patency of the accessed vessel. Slow intra arterial infusion of 5,000 IU heparin, 5 mg Verapamil and 200 mcg nitroglycerin diluted in patient's own blood was performed. No significant fluctuation in patient's blood pressure seen. Then, a right radial artery angiogram was obtained via sheath side port. Next, a 5 [place] Simmons 2 glide catheter was navigated over a 0.035" Terumo Glidewire into the right subclavian artery under fluoroscopic guidance. The catheter was then advanced into the left common carotid artery. Frontal and lateral angiograms of the neck were obtained. Using biplane roadmap guidance, the catheter was then advanced into the left internal carotid artery. Frontal, lateral, magnified waters and magnified lateral views of the head were obtained. FINDINGS: 1. Normal brachial artery branching pattern seen. No significant anatomical variation. The right radial artery caliber is adequate for vascular access. 2. 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 3. There is brisk vascular contrast filling of the bilateral ACA and left MCA vascular trees. The visualized dural sinuses are patent. PROCEDURE: The Simmons 2 glide  catheter was exchanged over the wire and under biplane roadmap for a 7 [place] Rist catheter which was placed in the distal cervical segment of the left ICA. Magnified frontal and lateral angiograms of the head were obtained in the working projections. Then, a [place] EX intermediate catheter was navigated through the risk catheter and over a via 17 microcatheter and a synchro 2 micro guidewire into the cavernous segment of the right ICA. Magnified frontal and lateral angiograms of the head were obtained in the working projections. The via microcatheter was then navigated over the wire into the left ICA terminus aneurysm pouch. The wire was removed. Then, a 4 x 2 mm web SL device was deployed into the aneurysm pouch. Frontal and lateral magnified angiograms of the head were obtained in the working projections. Adequate positioning of the device was noted. The device was then detached under fluoroscopy. Follow-up left internal carotid artery angiograms with magnified frontal and lateral views of the head in the working projections showed stable position of the device within the aneurysm pouch. Left ICA angiograms with frontal and lateral views of the entire head were then obtained, showed no evidence of thromboembolic complication. Flat panel CT of the head was obtained and post processed in a separate workstation with concurrent attending physician supervision. Selected images were sent to PACS. No evidence of hemorrhagic complication noted. The catheter was subsequently withdrawn. An inflatable band was placed and inflated over the right hand access site. The vascular sheath was withdrawn and the band was slowly deflated until brisk flow was noted through the arteriotomy site. At this point, the band was reinflated with additional 2 cc of air to obtain patent hemostasis. 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 No evidence of hemorrhagic of thromboembolic  complication. PLAN: Patient will be admitted to ICU for observation. Electronically Signed   By: Pedro Earls M.D.   On: 07/20/2021 15:53   ECHOCARDIOGRAM COMPLETE  Result Date: 07/20/2021    ECHOCARDIOGRAM REPORT   Patient Name:   SHALANDRIA ELSBERND Date of Exam: 07/20/2021 Medical Rec #:  [id number]        Height:       66.0 in Accession #:    [id number]       Weight:       231.0  lb Date of Birth:  1961-03-16        BSA:          2.126 m Patient Age:    5 years         BP:           138/80 mmHg Patient Gender: F                HR:           66 bpm. Exam Location:  Inpatient Procedure: 2D Echo Indications:    Stroke  History:        Patient has prior history of Echocardiogram examinations, most                 recent 10/30/2020. Abnormal ECG; Risk Factors:Diabetes,                 Hypertension and Dyslipidemia.  Sonographer:    Arlyss Gandy Referring Phys: [id number] [place]  1. Left ventricular ejection fraction, by estimation, is 65 to 70%. Left ventricular ejection fraction by PLAX is 66 %. The left ventricle has normal function. The left ventricle has no regional wall motion abnormalities. There is moderate asymmetric left ventricular hypertrophy of the basal-septal segment. Left ventricular diastolic parameters are consistent with Grade I diastolic dysfunction (impaired relaxation).  2. Right ventricular systolic function is normal. The right ventricular size is normal.  3. The mitral valve is grossly normal. Trivial mitral valve regurgitation.  4. The aortic valve is tricuspid. Aortic valve regurgitation is not visualized. Aortic valve sclerosis/calcification is present, without any evidence of aortic stenosis.  5. The inferior vena cava is normal in size with greater than 50% respiratory variability, suggesting right atrial pressure of 3 mmHg. Comparison(s): No prior Echocardiogram. 10/30/2020: LVEF 70-75%. FINDINGS  Left Ventricle: Left ventricular ejection fraction, by  estimation, is 65 to 70%. Left ventricular ejection fraction by PLAX is 66 %. The left ventricle has normal function. The left ventricle has no regional wall motion abnormalities. The left ventricular internal cavity size was normal in size. There is moderate asymmetric left ventricular hypertrophy of the basal-septal segment. Left ventricular diastolic parameters are consistent with Grade I diastolic dysfunction (impaired relaxation). Indeterminate filling pressures. Right Ventricle: The right ventricular size is normal. No increase in right ventricular wall thickness. Right ventricular systolic function is normal. Left Atrium: Left atrial size was normal in size. Right Atrium: Right atrial size was normal in size. Pericardium: There is no evidence of pericardial effusion. Mitral Valve: The mitral valve is grossly normal. Trivial mitral valve regurgitation. Tricuspid Valve: The tricuspid valve is grossly normal. Tricuspid valve regurgitation is trivial. Aortic Valve: The aortic valve is tricuspid. Aortic valve regurgitation is not visualized. Aortic valve sclerosis/calcification is present, without any evidence of aortic stenosis. Aortic valve mean gradient measures 7.0 mmHg. Aortic valve peak gradient measures 9.7 mmHg. Aortic valve area, by VTI measures 2.70 cm. Pulmonic Valve: The pulmonic valve was grossly normal. Pulmonic valve regurgitation is trivial. Aorta: The aortic root and ascending aorta are structurally normal, with no evidence of dilitation. Venous: The inferior vena cava is normal in size with greater than 50% respiratory variability, suggesting right atrial pressure of 3 mmHg. IAS/Shunts: No atrial level shunt detected by color flow Doppler.  LEFT VENTRICLE PLAX 2D LV EF:         Left            Diastology  ventricular     LV e' medial:    5.87 cm/s                ejection        LV E/e' medial:  12.0                fraction by     LV e' lateral:   8.70 cm/s                PLAX is 66       LV E/e' lateral: 8.1                %. LVIDd:         3.87 cm LVIDs:         2.49 cm LV PW:         1.22 cm LV IVS:        1.55 cm LVOT diam:     2.00 cm LV SV:         108 LV SV Index:   51 LVOT Area:     3.14 cm  RIGHT VENTRICLE RV S prime:     16.80 cm/s TAPSE (M-mode): 2.3 cm LEFT ATRIUM             Index LA diam:        2.70 cm 1.27 cm/m LA Vol (A2C):   55.4 ml 26.06 ml/m LA Vol (A4C):   34.1 ml 16.04 ml/m LA Biplane Vol: 44.3 ml 20.84 ml/m  AORTIC VALVE AV Area (Vmax):    2.94 cm AV Area (Vmean):   2.56 cm AV Area (VTI):     2.70 cm AV Vmax:           156.00 cm/s AV Vmean:          125.000 cm/s AV VTI:            0.399 m AV Peak Grad:      9.7 mmHg AV Mean Grad:      7.0 mmHg LVOT Vmax:         146.00 cm/s LVOT Vmean:        102.000 cm/s LVOT VTI:          0.343 m LVOT/AV VTI ratio: 0.86  AORTA Ao Root diam: 3.20 cm Ao Asc diam:  3.30 cm MITRAL VALVE MV Area (PHT): 2.12 cm     SHUNTS MV Decel Time: 357 msec     Systemic VTI:  0.34 m MV E velocity: 70.30 cm/s   Systemic Diam: 2.00 cm MV A velocity: 103.00 cm/s MV E/A ratio:  0.68 Lyman Bishop MD Electronically signed by Lyman Bishop MD Signature Date/Time: 07/20/2021/3:41:45 PM    Final    CT HEAD CODE STROKE WO CONTRAST`  Result Date: 07/20/2021 CLINICAL DATA:  Code stroke. EXAM: CT HEAD WITHOUT CONTRAST TECHNIQUE: Contiguous axial images were obtained from the base of the skull through the vertex without intravenous contrast. RADIATION DOSE REDUCTION: This exam was performed according to the departmental dose-optimization program which includes automated exposure control, adjustment of the mA and/or kV according to patient size and/or use of iterative reconstruction technique. COMPARISON:  06/08/2021 FINDINGS: Brain: No evidence of acute infarction, hemorrhage, cerebral edema, mass, mass effect, or midline shift. Ventricles and sulci are normal for age. No extra-axial fluid collection. Periventricular white matter changes, likely the  sequela of chronic small vessel ischemic disease. Vascular: No hyperdense vessel or unexpected calcification. Skull: Normal. Negative for fracture or focal lesion.  Sinuses/Orbits: Mucosal thickening in the maxillary sinuses. Status post left lens replacement. Other: The mastoid air cells are well aerated. ASPECTS [facility] Stroke Program Early CT Score) - Ganglionic level infarction (caudate, lentiform nuclei, internal capsule, insula, M1-M3 cortex): 7 - Supraganglionic infarction (M4-M6 cortex): 3 Total score (0-10 with 10 being normal): 10 IMPRESSION: 1. No acute intracranial process. 2. ASPECTS is 10 Code stroke imaging results were communicated on 07/20/2021 at 11:56 am to provider Dr. Lorrin Goodell via secure text paging. Electronically Signed   By: Merilyn Baba M.D.   On: 07/20/2021 11:57   CT ANGIO HEAD NECK W WO CM (CODE STROKE)  Result Date: 07/20/2021 CLINICAL DATA:  Neuro deficit, stroke suspected EXAM: CT ANGIOGRAPHY HEAD AND NECK TECHNIQUE: Multidetector CT imaging of the head and neck was performed using the standard protocol during bolus administration of intravenous contrast. Multiplanar CT image reconstructions and MIPs were obtained to evaluate the vascular anatomy. Carotid stenosis measurements (when applicable) are obtained utilizing NASCET criteria, using the distal internal carotid diameter as the denominator. RADIATION DOSE REDUCTION: This exam was performed according to the departmental dose-optimization program which includes automated exposure control, adjustment of the mA and/or kV according to patient size and/or use of iterative reconstruction technique. CONTRAST:  60m OMNIPAQUE IOHEXOL 350 MG/ML SOLN COMPARISON:  06/05/2021 CTA head, correlation is also made with same day CT head FINDINGS: CT HEAD FINDINGS For noncontrast findings, please see same day CT head. CTA NECK FINDINGS Aortic arch: Two-vessel arch with a common origin of the brachiocephalic and left common carotid arteries.  Imaged portion shows no evidence of aneurysm or dissection. No significant stenosis of the major arch vessel origins. Right carotid system: No evidence of dissection, stenosis (50% or greater) or occlusion. Calcified and noncalcified plaque at the bifurcation, which is not hemodynamically significant. Left carotid system: No evidence of dissection, stenosis (50% or greater) or occlusion. Calcified and noncalcified plaque at the bifurcation which is not hemodynamically significant. Vertebral arteries: Codominant. No evidence of dissection, stenosis (50% or greater) or occlusion. Skeleton: No acute osseous abnormality. Other neck: Negative. Upper chest: No focal pulmonary opacity or pleural effusion. Review of the MIP images confirms the above findings CTA HEAD FINDINGS Anterior circulation: Both internal carotid arteries are patent to the termini. Unchanged 3 mm supero medially projecting aneurysm arising from the distal cavernous right ICA (series 7, image 244). Interval coiling of the previously noted superiorly projecting aneurysm arising from the left ICA terminus at the origin of the left ACA and MCA (series 7, image 224). A1 segments patent, with redemonstrated hypoplastic right A1. Normal anterior communicating artery. Anterior cerebral arteries are patent to their distal aspects. No M1 stenosis or occlusion. Normal MCA bifurcations. Distal MCA branches perfused and symmetric. Posterior circulation: Vertebral arteries patent to the vertebrobasilar junction without stenosis. Posterior inferior cerebral arteries patent bilaterally. Basilar patent to its distal aspect. Superior cerebellar arteries patent bilaterally. Bilateral P1 segments originate from the basilar artery. PCAs perfused to their distal aspects without stenosis. The bilateral posterior communicating arteries are patent. Venous sinuses: As permitted by contrast timing, patent. Anatomic variants: None significant Review of the MIP images confirms  the above findings IMPRESSION: 1. 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 2.  No intracranial large vessel occlusion or significant stenosis. 3.  No hemodynamically significant stenosis in the neck. 4. Unchanged 3 mm aneurysm projecting from the distal cavernous right ICA. Code stroke imaging results were communicated on 07/20/2021 at 12:39 pm to provider Dr. KLorrin Goodell  via secure text paging. Electronically Signed   By: Merilyn Baba M.D.   On: 07/20/2021 12:40   IR ANGIO INTRA EXTRACRAN SEL INTERNAL CAROTID UNI R MOD SED  Result Date: 07/20/2021 INDICATION: Kween Bacorn Gomillion is a 61 year old female with past medical history significant for diabetes, hyperlipidemia, hypertension and cataract. She presented to the emergency room with headache on 06/05/2021. 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 She underwent a diagnostic cerebral angiogram on 06/19/2021 that confirmed the presence of the 2 brain aneurysms seen on prior CTA. She comes to our service today for elective treatment of her left ICA aneurysm. In preparation to today's procedure, she was started on Brilinta 90 mg b.i.d. and aspirin 81 mg q.d. EXAM: ULTRASOUND-GUIDED VASCULAR ACCESS DIAGNOSTIC CEREBRAL ANGIOGRAM ENDOVASCULAR ANEURYSM EMBOLIZATION FLAT PANEL HEAD CT COMPARISON:  Cerebral angiogram June 18, 2021. MEDICATIONS: Ancef 2 g IV. The antibiotic was administered within 1 hour of the procedure. ANESTHESIA/SEDATION: The study was performed in the general anesthesia. CONTRAST:  106 cc of Omnipaque 300 milligram/mL FLUOROSCOPY TIME:  Fluoroscopy Time: 42 minutes 48 seconds (1,843 mGy). COMPLICATIONS: Delayed - approximately one hour after procedure, patient developed left sided weakness, unclear whether procedure related given intervention in the left ICA. TECHNIQUE: Informed written consent was obtained from the patient after a thorough  discussion of the procedural risks, benefits and alternatives. All questions were addressed. Maximal Sterile Barrier Technique was utilized including caps, mask, sterile gowns, sterile gloves, sterile drape, hand hygiene and skin antiseptic. A timeout was performed prior to the initiation of the procedure. Using the modified Seldinger technique and a micropuncture kit, access was gained to the distal right radial artery at the anatomical snuffbox and a 7 French sheath was placed. Real-time ultrasound guidance was utilized for vascular access including the acquisition of a permanent ultrasound image documenting patency of the accessed vessel. Slow intra arterial infusion of 5,000 IU heparin, 5 mg Verapamil and 200 mcg nitroglycerin diluted in patient's own blood was performed. No significant fluctuation in patient's blood pressure seen. Then, a right radial artery angiogram was obtained via sheath side port. Next, a 5 [place] Simmons 2 glide catheter was navigated over a 0.035" Terumo Glidewire into the right subclavian artery under fluoroscopic guidance. The catheter was then advanced into the left common carotid artery. Frontal and lateral angiograms of the neck were obtained. Using biplane roadmap guidance, the catheter was then advanced into the left internal carotid artery. Frontal, lateral, magnified waters and magnified lateral views of the head were obtained. FINDINGS: 1. Normal brachial artery branching pattern seen. No significant anatomical variation. The right radial artery caliber is adequate for vascular access. 2. 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 3. There is brisk vascular contrast filling of the bilateral ACA and left MCA vascular trees. The visualized dural sinuses are patent. PROCEDURE: The Simmons 2 glide catheter was exchanged over the wire and under biplane roadmap for a 7 [place] Rist catheter which was placed in the distal cervical  segment of the left ICA. Magnified frontal and lateral angiograms of the head were obtained in the working projections. Then, a [place] EX intermediate catheter was navigated through the risk catheter and over a via 17 microcatheter and a synchro 2 micro guidewire into the cavernous segment of the right ICA. Magnified frontal and lateral angiograms of the head were obtained in  the working projections. The via microcatheter was then navigated over the wire into the left ICA terminus aneurysm pouch. The wire was removed. Then, a 4 x 2 mm web SL device was deployed into the aneurysm pouch. Frontal and lateral magnified angiograms of the head were obtained in the working projections. Adequate positioning of the device was noted. The device was then detached under fluoroscopy. Follow-up left internal carotid artery angiograms with magnified frontal and lateral views of the head in the working projections showed stable position of the device within the aneurysm pouch. Left ICA angiograms with frontal and lateral views of the entire head were then obtained, showed no evidence of thromboembolic complication. Flat panel CT of the head was obtained and post processed in a separate workstation with concurrent attending physician supervision. Selected images were sent to PACS. No evidence of hemorrhagic complication noted. The catheter was subsequently withdrawn. An inflatable band was placed and inflated over the right hand access site. The vascular sheath was withdrawn and the band was slowly deflated until brisk flow was noted through the arteriotomy site. At this point, the band was reinflated with additional 2 cc of air to obtain patent hemostasis. 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 No evidence of hemorrhagic of thromboembolic complication. PLAN: Patient will be admitted to ICU for observation. Electronically Signed   By: Pedro Earls M.D.    On: 07/20/2021 15:53    Assessment: 61 y.o. female with PMH of diabetes, hypertension, hyperlipidemia, obesity and OSA had left ICA aneurysm coiling with Dr. Norma Fredrickson on 07/20/2021.  Postop, patient had left sided weakness and left neglect, NIHSS 8. Head CT showed no acute findings. Received TNK. 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  However, it is still hard to relate patient left-sided symptoms.  CTA head and neck no significant stenosis.  EF 65 to 70%, LDL 69, A1c 8.4.  Patient was put on aspirin and Brilinta, but later on aspirin only.  Discharged to CIR on 07/23/2021 with aspirin 81 and Crestor 40.  On 07/24/2021, patient had episode of bilateral lower extremity weakness, lasted several hours. Stat CT no acute finding.  Patient symptoms gradually improved and resolved on 07/25/2021.  However, patient continued to complain of subtle right sided weakness since.  Neurology was called back for further evaluation.  Plan: - agree with repeat MRI to rule out new infarct - continue ASA and crestor for now - if new stroke on MRI, may need to consider DAPT, cardioembolic work up or evaluate left ICA coiling if needed.  - PT/OT/speech - will follow  Thank you for this consultation and allowing [place] to participate in the care of this patient.  Rosalin Hawking, MD PhD Stroke Neurology 07/27/2021 11:13 PM

## 2021-07-27 NOTE — Progress Notes (Signed)
Speech Language Pathology Daily Session Note  Patient Details  Name: Shannon Oliver MRN: [id number] Date of Birth: [birth year]  Today's Date: 07/27/2021 SLP Individual Time: 1355-1430 SLP Individual Time Calculation (min): 35 min  Short Term Goals: Week 1: SLP Short Term Goal 1 (Week 1): STGs=LTGs due to ELOS  Skilled Therapeutic Interventions: Skilled treatment session focused on cognitive goals. Upon arrival, patient reporting frustration with poor recall of her ATM pin number. Patient had written down what she thought the pin number was but was unable to get confirmation from the bank. Therefore, SLP propelled the patient to the ATM in the hospital. Patient able to utilize the ATM machine appropriately and recalled her pin number independently. SLP also educated patient on a variety of memory tasks, however, patient reported she prefers to write information down. Patient provided a notebook and a date book that she is currently utilizing. Patient's information appeared messy and disorganized and SLP provided education regarding importance of organization to improve recall and carryover of information. Patient independently recalled events from previous therapy sessions and word-finding appeared [facility] throughout session at the conversation level. Patient requested to use the bathroom and required Min verbal cues for safety with task due to impulsivity. Patient was continent of bladder. Patient left sitting EOB with alarm on and all needs within reach. Continue with current plan of care.       Pain Pain Assessment Pain Scale: Faces Pain Score: 0-No pain  Therapy/Group: Individual Therapy  Yaacov Koziol 07/27/2021, 3:07 PM

## 2021-07-27 NOTE — Progress Notes (Signed)
PROGRESS NOTE   Subjective/Complaints:  Pt c/o weakness in BLE on Friday requiring a lift for transfers , was able to participate in therapy with resolution on Sat Had a CT of Brain on Friday that showed no acute changes , reviewed prior MRI showing scattered bilateral small cortical and subcortical infarcts   ROS- Bilateral hand and wrist swelling - improved per pt, denies CP, SOB N/V/D, Objective:   No results found. No results for input(s): WBC, HGB, HCT, PLT in the last 72 hours.  No results for input(s): NA, K, CL, CO2, GLUCOSE, BUN, CREATININE, CALCIUM in the last 72 hours.   Intake/Output Summary (Last 24 hours) at 07/27/2021 0938 Last data filed at 07/26/2021 1830 Gross per 24 hour  Intake 480 ml  Output --  Net 480 ml         Physical Exam: Vital Signs Blood pressure (!) 156/78, pulse 73, temperature 98 F (36.7 C), temperature source Oral, resp. rate 18, height 5\' 6"  (1.676 m), weight 104.5 kg, last menstrual period 08/02/2013, SpO2 98 %.  General: No acute distress Mood and affect are appropriate Heart: Regular rate and rhythm no rubs murmurs or extra sounds Lungs: Clear to auscultation, breathing unlabored, no rales or wheezes Abdomen: Positive bowel sounds, soft nontender to palpation, nondistended Extremities: No clubbing, cyanosis, or edema   Skin: No evidence of breakdown, no evidence of rash Neurologic: Cranial nerves II through XII intact, motor strength is 5/5 in left  deltoid, bicep, tricep, grip, 4/5 left hip flexor, knee extensors, ankle dorsiflexor and plantar flexor 3- RUE, 4/5 RLE  Sensory exam normal sensation to light touch in bilateral upper and lower extremities Fine motor reduced finger to thumb opposition on RIght side  Musculoskeletal: R>L hand and wrist edema bilateral hand and wrist ecchymosis   Assessment/Plan: 1. Functional deficits which require 3+ hours per day of  interdisciplinary therapy in a comprehensive inpatient rehab setting. Physiatrist is providing close team supervision and 24 hour management of active medical problems listed below. Physiatrist and rehab team continue to assess barriers to discharge/monitor patient progress toward functional and medical goals  Care Tool:  Bathing  Bathing activity did not occur:  (not observed at the time of assessment, the pt had completed the task prior to arrival.)           Bathing assist       Upper Body Dressing/Undressing Upper body dressing   What is the patient wearing?: Pull over shirt    Upper body assist Assist Level: Minimal Assistance - Patient > 75%    Lower Body Dressing/Undressing Lower body dressing      What is the patient wearing?: Underwear/pull up     Lower body assist Assist for lower body dressing: Contact Guard/Touching assist     Toileting Toileting    Toileting assist Assist for toileting: Contact Guard/Touching assist     Transfers Chair/bed transfer  Transfers assist     Chair/bed transfer assist level: Minimal Assistance - Patient > 75% Chair/bed transfer assistive device: Programmer, multimedia   Ambulation assist   Ambulation activity did not occur: Safety/medical concerns  Assist level: Minimal Assistance - Patient >  75% Assistive device: No Device Max distance: 157ft   Walk 10 feet activity   Assist  Walk 10 feet activity did not occur: Safety/medical concerns        Walk 50 feet activity   Assist Walk 50 feet with 2 turns activity did not occur: Safety/medical concerns         Walk 150 feet activity   Assist Walk 150 feet activity did not occur: Safety/medical concerns         Walk 10 feet on uneven surface  activity   Assist Walk 10 feet on uneven surfaces activity did not occur: Safety/medical concerns         Wheelchair     Assist Is the patient using a wheelchair?: No   Wheelchair  activity did not occur: Safety/medical concerns         Wheelchair 50 feet with 2 turns activity    Assist    Wheelchair 50 feet with 2 turns activity did not occur: Safety/medical concerns       Wheelchair 150 feet activity     Assist  Wheelchair 150 feet activity did not occur: Safety/medical concerns       Blood pressure (!) 156/78, pulse 73, temperature 98 F (36.7 C), temperature source Oral, resp. rate 18, height 5\' 6"  (1.676 m), weight 104.5 kg, last menstrual period 08/02/2013, SpO2 98 %.  Medical Problem List and Plan: 1. 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  Antithrombotics: -DVT/anticoagulation:  Mechanical:  Antiembolism stockings, knee (TED hose) Bilateral lower extremities             -antiplatelet therapy: Aspirin 81 mg daily Hand /wrist swelling s/p bilateral radial art cath  3. Pain Management: Tylenol as needed Mainly weak RUE - has pain and decreased muscular support right wrist will order wrist splint  4. Mood: Melatonin as needed             -antipsychotic agents: N/A 5. Neuropsych: This patient is capable of making decisions on her own behalf. 6. Skin/Wound Care: Routine skin checks 7. Fluids/Electrolytes/Nutrition: Routine in and outs with follow-up chemistries BMP Latest Ref Rng & Units 07/24/2021 07/20/2021 07/07/2021  Glucose 70 - 99 mg/dL 201(H) 210(H) 211(H)  BUN 6 - 20 mg/dL 15 20 22   Creatinine 0.44 - 1.00 mg/dL 0.76 1.00 0.83  Sodium 135 - 145 mmol/L 139 139 140  Potassium  3.5 - 5.1 mmol/L 3.9 4.1 4.0  Chloride 98 - 111 mmol/L 107 103 106  CO2 22 - 32 mmol/L 25 25 25   Calcium 8.9 - 10.3 mg/dL 9.0 9.7 9.5    8.  Hypertension.   Continue Norvasc 10 mg daily,Cozaar 100 mg daily, Coreg 37.5 mg twice daily.  Vitals:   07/26/21 1924 07/27/21 0242  BP: 128/76 (!) 156/78  Pulse: 71 73  Resp: 16 18  Temp: 98.1 F (36.7 C) 98 F (36.7 C)  SpO2: 100% 98%  Some lability this am only   9.  Diabetes mellitus.  Hemoglobin A1c 8.4.  SSI.  Patient on Glucophage 1000 mg daily, Toujeo prior to admission.  Resume as needed CBG (last 3)  Recent Labs    07/26/21 1644 07/26/21 2128 07/27/21 0616  GLUCAP 211* 196* 185*   Start insulin glargine 10U qhs. Increase metformin 500mg  BID   10.  Hyperlipidemia.  Continue Crestor 11.  Obesity.  BMI 37.28.  Dietary follow-up 12.  Question medical compliance.  Provide counseling 13.  GERD.  Protonix    LOS: 4 days A FACE TO FACE EVALUATION WAS PERFORMED  Charlett Blake 07/27/2021, 8:12 AM

## 2021-07-27 NOTE — Progress Notes (Addendum)
STROKE TEAM PROGRESS NOTE   ATTENDING NOTE: I reviewed above note and agree with the assessment and plan. Pt was seen and examined.   Husband at bedside.  Patient sitting at edge of bed, no acute event overnight, no complaints.  Still felt right side mildly weaker than left.  MRI overnight showed 2-3 small punctate new infarct at the left MCA territory compared with last MRI.  Given within the left MCA territory, concerning for WEB device related, discussed with Dr. Estanislado Pandy, put on aspirin and Brilinta for 4 weeks and then aspirin alone.  Continue statin.  We will check a CTA head and neck to rule out thrombus around coiling.  Continue PT/OT.  On initial exam by neuro NP, patient had significant weakness on the right side with right upper extremity 4/5 and right lower extremity 2/5, no facial droop.  However, on my examination, right upper at least 4+/5 and lower extremity proximal 3/5 and distal 4+/5.  Patient does have some giveaway weakness and lack of effort, concerning for functional component.  We will continue monitoring and PT/OT.  For detailed assessment and plan, please refer to above as I have made changes wherever appropriate.   Rosalin Hawking, MD PhD Stroke Neurology 07/28/2021 5:48 PM    INTERVAL HISTORY Patient is seen in her room on CIR. She has just returned from OT. She states that her bilateral lower extremities were not working properly on Friday, this has improved. Today her right arm is weaker than her left. A repeat CT was done and was stable. MRI showed new punctate infarcts. CTA head and neck pending.  Vitals:   07/26/21 0803 07/26/21 1411 07/26/21 1924 07/27/21 0242  BP: 128/73 114/75 128/76 (!) 156/78  Pulse: (!) 54 70 71 73  Resp:  18 16 18   Temp:  98.7 F (37.1 C) 98.1 F (36.7 C) 98 F (36.7 C)  TempSrc:    Oral  SpO2:  100% 100% 98%  Weight:      Height:       CBC:  Recent Labs  Lab 07/24/21 0536  WBC 5.8  NEUTROABS 2.8  HGB 11.1*  HCT 33.4*  MCV  89.1  PLT 258    Basic Metabolic Panel:  Recent Labs  Lab 07/24/21 0536  NA 139  K 3.9  CL 107  CO2 25  GLUCOSE 201*  BUN 15  CREATININE 0.76  CALCIUM 9.0    Lipid Panel:  Recent Labs  Lab 07/21/21 0111  CHOL 129  TRIG 53  HDL 49  CHOLHDL 2.6  VLDL 11  LDLCALC 69    HgbA1c:  Recent Labs  Lab 07/21/21 0111  HGBA1C 8.4*    Urine Drug Screen: No results for input(s): LABOPIA, COCAINSCRNUR, LABBENZ, AMPHETMU, THCU, LABBARB in the last 168 hours.  Alcohol Level No results for input(s): ETH in the last 168 hours.  IMAGING past 24 hours No results found.  PHYSICAL EXAM General:  Alert, well-nourished, well-developed female in no acute distress.  Swelling and bruising of right hand noted but improved from yesterday per patient   NEURO:  Mental Status: AA&Ox3  Speech/Language: speech is without dysarthria or aphasia.  Naming, fluency, and comprehension intact.  Cranial Nerves:  II: PERRL. Visual fields full.  III, IV, VI: EOMI. Eyelids elevate symmetrically.  V: Sensation is intact to light touch and symmetrical to face.  VII: Smile is symmetrical.   VIII: hearing intact to voice. IX, X:  Phonation is normal.  XII: tongue is midline without  fasciculations. Motor: 5/5 strength to LLE ans LUE,  RUE 4/5 RLE 2/5 Tone: is normal and bulk is normal Sensation- Intact to light touch bilaterally.   Coordination: FTN intact bilaterally, HKS: no ataxia in BLE. Fine motor slowing with right fingers.  Gait- deferred   ASSESSMENT/PLAN Ms. OTA EBERSOLE is a 61 y.o. 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 After the procedure, she developed new onset left sided weakness and decreased sensation after the procedure.  TNKase was administered with improvement of symptoms.  24 hours post TNKase aspirin was resumed. MRI demonstrates scattered, mostly punctate, acute infarcts in bilateral cerebral  hemispheres. Admitted to CIR on 07/23/2021. Bilaterally lower extremity weakness noted on 07/24/2021 which has now resolved. On exam, patient is weaker with her right upper extremity on 1/23. Today, 1/24 RLE is 2/5, unable to move against gravity without assistance. She can resist movement both proximally and distally. Plan for CTA to evaluate for a clot around the coiled area.    Stroke:  left MCA and b/l ACA scattered punctate infarcts likely related to the procedure for coiling of left terminal ICA aneurysm Stroke: 2-3 new spot of left MCA infarcts concerning for s/p coiling without DAPT CT head 07/20/21 No acute abnormality. ASPECTS 10.    CTA head & neck 1/16 s/p coiling of left ICA aneurysm, no LVO or significant stenosis, 34mm right ICA aneurysm MRI 1/17 scattered small and punctate infarcts in bialteral cerbral hemispheres with no hemorrhage or mass effect, chronic small vessel disease Repeat MRI 1/22 - New infarcts at the left occipital lobe, left corona radiata, and left body of the caudate. CTA head and neck repeat pending 2D Echo EF 50-09%, grade 1 diastolic dysfunction, no atrial level shunt LDL 69 HgbA1c 8.4 VTE prophylaxis - SCDs Was on ASA alone, now on aspirin and brilinta DAPT for 4 weeks and then ASA alone.  Therapy recommendations: CIR  S/p coiling of left ICA aneurysm S/p WEB device ASA resumed Now brilinta added CTA head and neck repeat pending  Hypertension Home meds:  amlodipine 10 mg daily, carvedilol 25 mg daily, losartan 100 mg daily, spironolactone 25 mg daily Now on norvasc 10, coreg 37.5 bid, cozaar 100 Stable Long-term BP goal normotensive  Hyperlipidemia Home meds:  rosuvastatin 40 mg daily, resumed in hospital LDL 69, goal < 70 Continue statin at discharge  Diabetes type II Uncontrolled Home meds:  Farxiga 5 mg daily, insulin aspart 8 units TIDAC, metformin 1000 mg daily HgbA1c 8.4, goal < 7.0 CBGs SSI Close PCP follow-up for better DM  control  Other Stroke Risk Factors Obesity, Body mass index is 37.18 kg/m., BMI >/= 30 associated with increased stroke risk, recommend weight loss, diet and exercise as appropriate  Obstructive sleep apnea, on CPAP at home  Other [facility] day # 4  Patient seen and examined by NP/APP with MD. MD to update note as needed.   Janine Ores, DNP, FNP-BC [facility] Pager: [phone]    To contact Stroke Continuity provider, please refer to [email or web address] After hours, contact General Neurology

## 2021-07-28 ENCOUNTER — Inpatient Hospital Stay (HOSPITAL_COMMUNITY): Payer: BC Managed Care – PPO

## 2021-07-28 LAB — GLUCOSE, CAPILLARY
Glucose-Capillary: 158 mg/dL — ABNORMAL HIGH (ref 70–99)
Glucose-Capillary: 138 mg/dL — ABNORMAL HIGH (ref 70–99)
Glucose-Capillary: 270 mg/dL — ABNORMAL HIGH (ref 70–99)
Glucose-Capillary: 168 mg/dL — ABNORMAL HIGH (ref 70–99)

## 2021-07-28 IMAGING — CT CT ANGIO HEAD-NECK (W OR W/O PERF)
2 of 11 series · 8 of 33 positions shown · non-contrast
Comparison: CTA [DATE], CT head [DATE]

CLINICAL DATA: Stroke, determine embolic source, aneurysm cavernous
portion of left ICA

EXAM:
CT ANGIOGRAPHY HEAD AND NECK
TECHNIQUE: Multidetector CT imaging of the head and neck was performed using
the standard protocol during bolus administration of intravenous
contrast. Multiplanar CT image reconstructions and MIPs were
obtained to evaluate the vascular anatomy. Carotid stenosis
measurements (when applicable) are obtained utilizing NASCET
criteria, using the distal internal carotid diameter as the
denominator.

[Series 5: cta neck/head (person_name) · axial · 0.54mm/px · z∈[-386,-44]mm · 3 of 172 slices shown]
[im 1/172  soft-tissue]
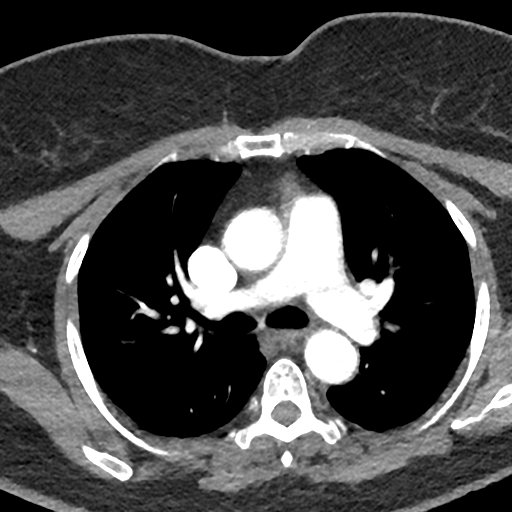
[im 86/172  bone]
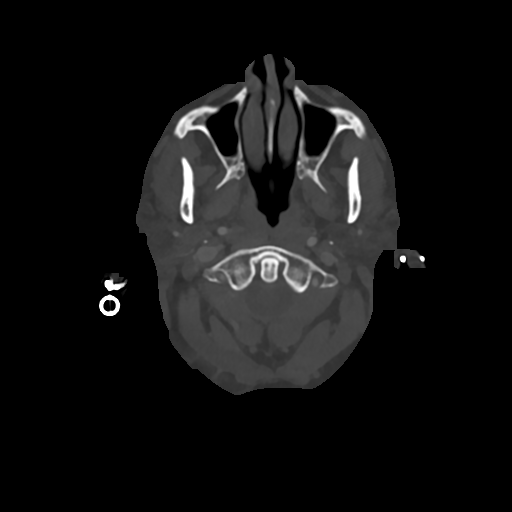
[im 172/172  soft-tissue]
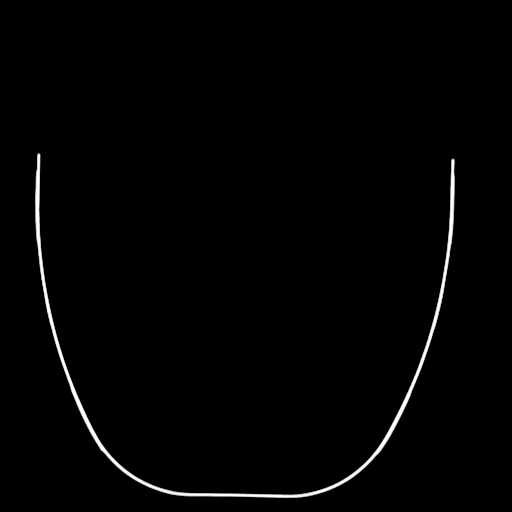

[Series 11: ax thins (person_name) · axial · 0.39mm/px · z∈[-329,-103]mm · 5 of 340 slices shown]
[im 57/340  soft-tissue]
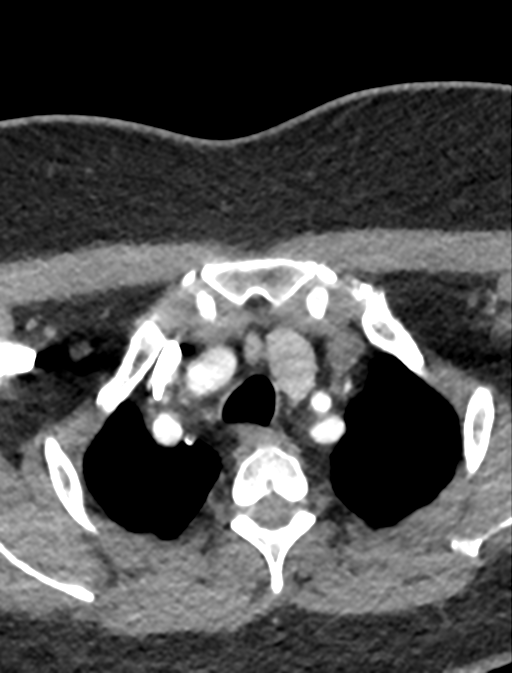
[im 114/340  soft-tissue]
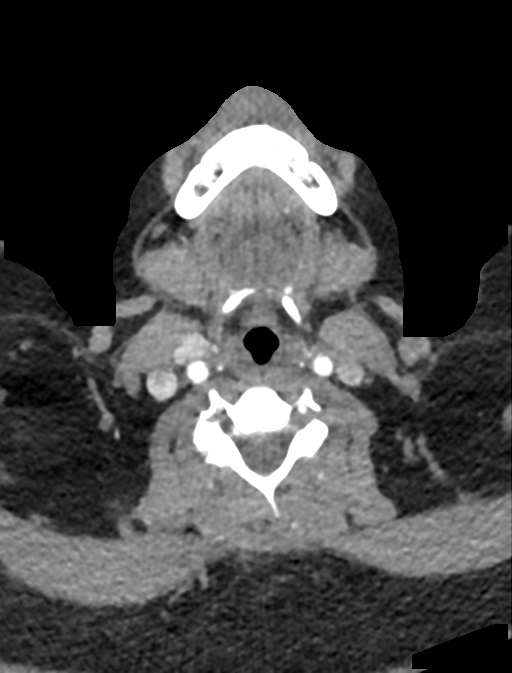
[im 170/340  soft-tissue]
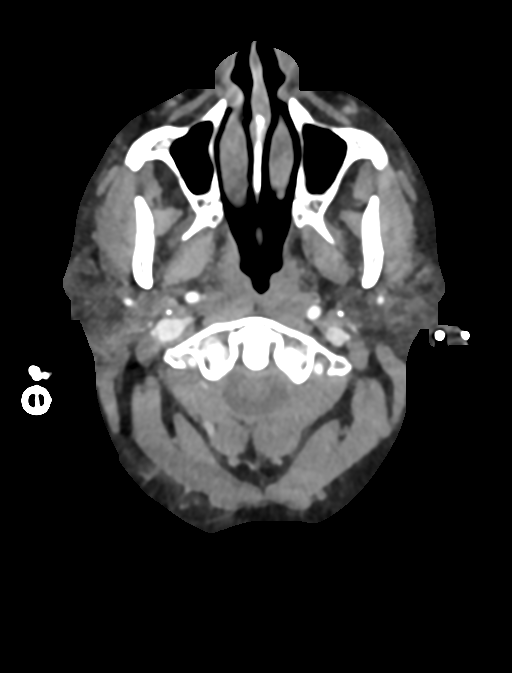
[im 227/340  soft-tissue]
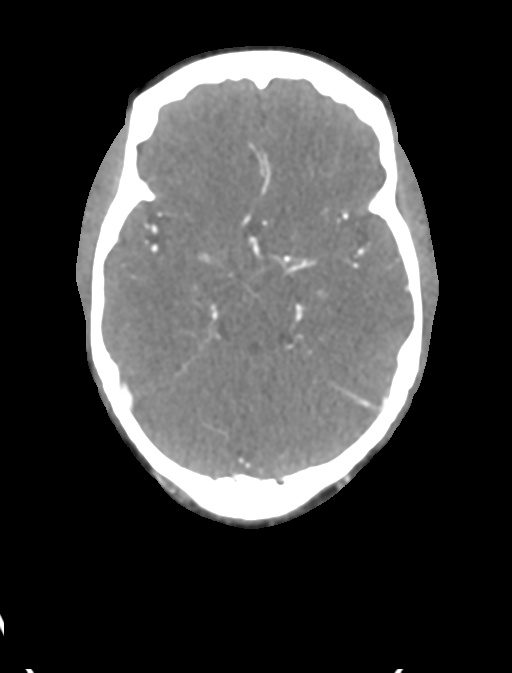
[im 283/340  soft-tissue]
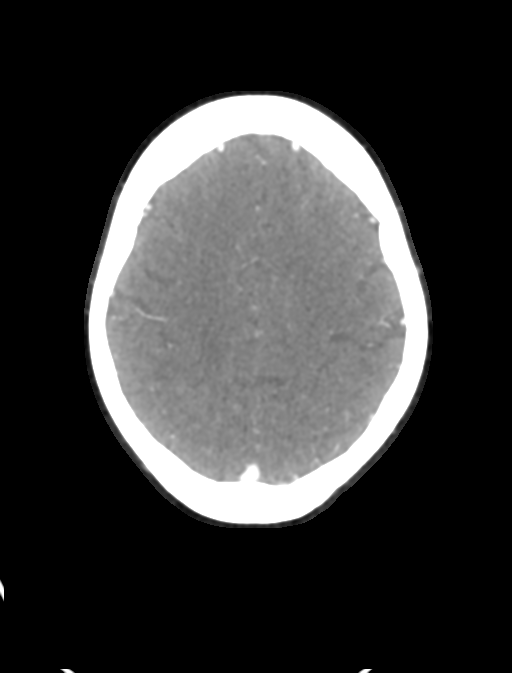

[8 of 33 positions shown; findings below may reference images not displayed]

RADIATION DOSE REDUCTION: This exam was performed according to the
departmental dose-optimization program which includes automated
exposure control, adjustment of the mA and/or kV according to
patient size and/or use of iterative reconstruction technique.

CONTRAST:  75mL OMNIPAQUE IOHEXOL 350 MG/ML SOLN
FINDINGS: CT HEAD FINDINGS

Brain: No evidence of acute infarction, hemorrhage, cerebral edema,
mass, mass effect, or midline shift. No hydrocephalus or extra-axial
fluid collection. Periventricular white matter changes, likely the
sequela of chronic small vessel ischemic disease.

Vascular: No hyperdense vessel. Aneurysmal coil at the left ICA
terminus.

Skull: Normal. Negative for fracture or focal lesion.

Sinuses/Orbits: No acute finding.

Other: The mastoid air cells are well aerated.

CTA NECK FINDINGS

Aortic arch: Two-vessel arch with a common origin of the
brachiocephalic and left common carotid arteries. Imaged portion
shows no evidence of aneurysm or dissection. No significant stenosis
of the major arch vessel origins.

Right carotid system: No evidence of dissection, stenosis (50% or
greater) or occlusion.

Left carotid system: No evidence of dissection, stenosis (50% or
greater) or occlusion.

Vertebral arteries: No evidence of dissection, stenosis (50% or
greater) or occlusion.

Skeleton: No acute osseous abnormality.

Other neck: None.

Upper chest: No focal pulmonary opacity.

Review of the MIP images confirms the above findings

CTA HEAD FINDINGS

Anterior circulation: Unchanged 3 mm superomedially projecting
aneurysm arising from the distal cavernous right ICA (series 11,
image 131), unchanged compared to [DATE]. Redemonstrated coiling
of an aneurysm at the left ICA terminus at the origin of the left
ACA and MCA (series 11, image 224). Both ICAs are otherwise patent
to the termini without significant stenosis.

Left A1 segment patent. Redemonstrated hypoplastic right A1. Normal
anterior communicating artery. Anterior cerebral arteries are patent
to their distal aspects.

No M1 stenosis or occlusion. Normal MCA bifurcations. Distal MCA
branches perfused and symmetric.

Posterior circulation:

Vertebral arteries patent to the vertebrobasilar junction without
stenosis. Posterior inferior cerebral arteries patent bilaterally.

Basilar patent to its distal aspect. Superior cerebellar arteries
patent bilaterally.

Bilateral P1 segments originate from the basilar artery. PCAs
perfused to their distal aspects without stenosis. Bilateral
posterior communicating arteries are visualized.

Venous sinuses: As permitted by contrast timing, patent.

Anatomic variants: None significant

Review of the MIP images confirms the above findings
IMPRESSION: 1. Unchanged 3 mm aneurysm projecting from the distal cavernous
right ICA. No new aneurysm is seen.
2.  No intracranial large vessel occlusion or significant stenosis.
3.  No hemodynamically significant stenosis in the neck.
4. Redemonstrated prior coiling of an aneurysm at the left ICA
terminus.

## 2021-07-28 MED ORDER — FLUTICASONE PROPIONATE 50 MCG/ACT NA SUSP
1.0000 | Freq: Once | NASAL | Status: DC | PRN
Start: 2021-07-28 — End: 2021-07-28

## 2021-07-28 MED ORDER — TICAGRELOR 90 MG PO TABS
90.0000 mg | ORAL_TABLET | Freq: Two times a day (BID) | ORAL | Status: DC
  Administered 2021-07-28 – 2021-08-01 (×8): 90 mg via ORAL
  Filled 2021-07-28 (×9): qty 1

## 2021-07-28 MED ORDER — FLUTICASONE PROPIONATE 50 MCG/ACT NA SUSP
1.0000 | Freq: Every day | NASAL | Status: DC | PRN
  Administered 2021-07-28: 12:00:00 1 via NASAL
  Filled 2021-07-28: qty 16

## 2021-07-28 MED ORDER — TICAGRELOR 90 MG PO TABS
180.0000 mg | ORAL_TABLET | Freq: Once | ORAL | Status: AC
  Administered 2021-07-28: 09:00:00 180 mg via ORAL
  Filled 2021-07-28: qty 2

## 2021-07-28 MED ORDER — IOHEXOL 350 MG/ML SOLN
75.0000 mL | Freq: Once | INTRAVENOUS | Status: AC | PRN
  Administered 2021-07-28: 18:00:00 75 mL via INTRAVENOUS

## 2021-07-28 NOTE — Progress Notes (Signed)
Occupational Therapy Session Note  Patient Details  Name: Shannon Oliver MRN: [id number] Date of Birth: 1960-12-04  Today's Date: 07/28/2021 OT Individual Time: 1300-1356 OT Individual Time Calculation (min): 56 min    Short Term Goals: Week 1:  OT Short Term Goal 1 (Week 1): Patient to demonstrate ModI to MinA  with all simple BADL related task incorporating a  RW for 95% safety during task perforance. OT Short Term Goal 1 - Progress (Week 1): Progressing toward goal   Skilled Therapeutic Interventions/Progress Updates:    Pt greeted at time of session sitting up in wheelchair with husband present who remained. Discussed with pt role and purpose of OT, pt deferring shower until tomorrow. Pt ambulating throughout room > bathroom > sink with Supervision/CGA and transferring to commode same manner. Pt ambulating room <> elevators and 4th floor ADL apartment in same manner as well with wheelchair follow in case of fatigue but did not need. Pt ed on getting up/down from low surfaces such as sofa, performed x2 with supervision. Ambulating throughout kitchen for IADL retraining with cues for hand placement on stable surfaces and compensatory techniques. Walk in shower transfer posterior entry method to simulate home with CGA/Supervision x2 trials, stating already has shower bench that her husband uses. 1 round of dynamic standing in gym with 6# dumbbell for "woodchoppers" crossing midline with mirror for feedback each side before walking back up to room. Up in chair alarm on call bell in reach.   Therapy Documentation Precautions:  Precautions Precautions: Fall Precaution Comments: mild right hemiparesis Restrictions Weight Bearing Restrictions: No     Therapy/Group: Individual Therapy  Viona Gilmore 07/28/2021, 12:55 PM

## 2021-07-28 NOTE — Plan of Care (Signed)
°  Problem: RH Balance Goal: LTG Patient will maintain dynamic sitting balance (PT) Description: LTG:  Patient will maintain dynamic sitting balance with assistance during mobility activities (PT) Flowsheets (Taken 07/28/2021 1534) LTG: Pt will maintain dynamic sitting balance during mobility activities with:: (upgraded based on pt's progress) Independent with assistive device  Note: upgraded based on pt's progress Goal: LTG Patient will maintain dynamic standing balance (PT) Description: LTG:  Patient will maintain dynamic standing balance with assistance during mobility activities (PT) Flowsheets (Taken 07/28/2021 1534) LTG: Pt will maintain dynamic standing balance during mobility activities with:: (upgraded based on pt's progress) Contact Guard/Touching assist Note: upgraded based on pt's progress   Problem: Sit to Stand Goal: LTG:  Patient will perform sit to stand with assistance level (PT) Description: LTG:  Patient will perform sit to stand with assistance level (PT) Flowsheets (Taken 07/28/2021 1534) LTG: PT will perform sit to stand in preparation for functional mobility with assistance level: (upgraded based on pt's progress) Supervision/Verbal cueing Note: upgraded based on pt's progress   Problem: RH Bed Mobility Goal: LTG Patient will perform bed mobility with assist (PT) Description: LTG: Patient will perform bed mobility with assistance, with/without cues (PT). Flowsheets (Taken 07/28/2021 1534) LTG: Pt will perform bed mobility with assistance level of: (upgraded based on pt's progress) Supervision/Verbal cueing Note: upgraded based on pt's progress   Problem: RH Bed to Chair Transfers Goal: LTG Patient will perform bed/chair transfers w/assist (PT) Description: LTG: Patient will perform bed to chair transfers with assistance (PT). Flowsheets (Taken 07/28/2021 1534) LTG: Pt will perform Bed to Chair Transfers with assistance level: (upgraded based on pt's progress)  Supervision/Verbal cueing Note: upgraded based on pt's progress   Problem: RH Car Transfers Goal: LTG Patient will perform car transfers with assist (PT) Description: LTG: Patient will perform car transfers with assistance (PT). Flowsheets (Taken 07/28/2021 1534) LTG: Pt will perform car transfers with assist:: (upgraded based on pt's progress) Contact Guard/Touching assist Note: upgraded based on pt's progress   Problem: RH Ambulation Goal: LTG Patient will ambulate in controlled environment (PT) Description: LTG: Patient will ambulate in a controlled environment, # of feet with assistance (PT). Flowsheets (Taken 07/28/2021 1534) LTG: Pt will ambulate in controlled environ  assist needed:: (upgraded based on pt's progress) Supervision/Verbal cueing LTG: Ambulation distance in controlled environment: 171ft using LRAD Note: upgraded based on pt's progress   Problem: RH Stairs Goal: LTG Patient will ambulate up and down stairs w/assist (PT) Description: LTG: Patient will ambulate up and down # of stairs with assistance (PT) Flowsheets (Taken 07/28/2021 1534) LTG: Pt will ambulate up/down stairs assist needed:: (upgraded based on pt's progress) Contact Guard/Touching assist LTG: Pt will  ambulate up and down number of stairs: 4 steps using HRs as needed Note: upgraded based on pt's progress   Problem: RH Ambulation Goal: LTG Patient will ambulate in home environment (PT) Description: LTG: Patient will ambulate in home environment, # of feet with assistance (PT). Flowsheets (Taken 07/28/2021 1536) LTG: Pt will ambulate in home environ  assist needed:: (goal added as pt will be a functional household ambulator) -- LTG: Ambulation distance in home environment: 10ft using LRAD Note: goal added as pt will be a functional household ambulator

## 2021-07-28 NOTE — Progress Notes (Addendum)
PROGRESS NOTE   Subjective/Complaints:  No recurrent LLE weakness, reviewed MRI brain from yesterday ( small new infarcts on Left side only caudate, CR and occipital lobe) , discussed with neuro Dr Erlinda Hong , who contacted IR.  Recommending CTA head and neck  Per Neuro d/c summary ,L ICA aneurysm was not stented on ASA only at d/c from acute  No sig back or neck pain , no sciatic symptoms    ROS- Bilateral hand and wrist swelling - improved per pt, denies CP, SOB N/V/D, Objective:   MR BRAIN WO CONTRAST  Result Date: 07/28/2021 CLINICAL DATA:  Stroke, follow-up EXAM: MRI HEAD WITHOUT CONTRAST TECHNIQUE: Multiplanar, multiecho pulse sequences of the brain and surrounding structures were obtained without intravenous contrast. COMPARISON:  07/21/2021 MRI FINDINGS: Brain: Again noted are multiple punctate foci of restricted diffusion in the bilateral cerebral hemispheres, with some new foci of restricted diffusion with ADC correlates: Left occipital lobe (series 2, image 17; left frontal lobe corona radiata (series 2, image 39); left body of the caudate (series 2, images 33 and 34). Additional foci of restricted diffusion correlate with those seen on the prior exam and are more likely to be subacute infarcts, although some of them continues to demonstrate diffusion restriction with persistent ADC correlates, such as the focus in the left external capsule (series 2, image 25), left occipital lobe (series 2, image 23 and 29), and left corona radiata (series 2, image 37). Other previously noted infarcts demonstrate pseudonormalization of the ADC. No acute hemorrhage, mass, mass effect, or midline shift. Confluent T2 hyperintense signal in the periventricular white matter, likely the sequela of moderate to severe chronic small vessel ischemic disease. No hydrocephalus or extra-axial collection. Vascular: Normal flow voids. Skull and upper cervical spine:  Normal marrow signal. Sinuses/Orbits: Mucosal thickening in the ethmoid air cells. Status post left lens replacement. Other: The mastoids are well aerated. IMPRESSION: Punctate foci of restricted diffusion, consistent with acute and subacute infarcts, with new infarcts, compared to 07/21/2021, seen in the left occipital lobe, left corona radiata, and left body of the caudate. Given multiple vascular territories, an embolic etiology is suspected. These results will be called to the ordering clinician or representative by the Radiologist Assistant, and communication documented in the PACS or [employer]. Electronically Signed   By: Merilyn Baba M.D.   On: 07/28/2021 00:13   No results for input(s): WBC, HGB, HCT, PLT in the last 72 hours.  No results for input(s): NA, K, CL, CO2, GLUCOSE, BUN, CREATININE, CALCIUM in the last 72 hours.   Intake/Output Summary (Last 24 hours) at 07/28/2021 0824 Last data filed at 07/28/2021 0740 Gross per 24 hour  Intake 720 ml  Output --  Net 720 ml         Physical Exam: Vital Signs Blood pressure 135/73, pulse 63, temperature 98 F (36.7 C), temperature source Oral, resp. rate 18, height 5\' 6"  (1.676 m), weight 104.5 kg, last menstrual period 08/02/2013, SpO2 100 %.  General: No acute distress Mood and affect are appropriate Heart: Regular rate and rhythm no rubs murmurs or extra sounds Lungs: Clear to auscultation, breathing unlabored, no rales or wheezes Abdomen: Positive bowel  sounds, soft nontender to palpation, nondistended Extremities: No clubbing, cyanosis, or edema   Skin: No evidence of breakdown, no evidence of rash Neurologic: Cranial nerves II through XII intact, motor strength is 5/5 in left  deltoid, bicep, tricep, grip, 5/5 left hip flexor, knee extensors, ankle dorsiflexor and plantar flexor 3- RUE, 4+/5 RLE  Sensory exam normal sensation to light touch in bilateral upper and lower extremities Fine motor reduced finger to thumb  opposition on RIght side  Musculoskeletal: R>L hand and wrist edema bilateral hand and wrist ecchymosisimproving    Assessment/Plan: 1. Functional deficits which require 3+ hours per day of interdisciplinary therapy in a comprehensive inpatient rehab setting. Physiatrist is providing close team supervision and 24 hour management of active medical problems listed below. Physiatrist and rehab team continue to assess barriers to discharge/monitor patient progress toward functional and medical goals  Care Tool:  Bathing  Bathing activity did not occur:  (not observed at the time of assessment, the pt had completed the task prior to arrival.)           Bathing assist Assist Level: Minimal Assistance - Patient > 75% (MinA for BADLs per OT eval)     Upper Body Dressing/Undressing Upper body dressing   What is the patient wearing?: Pull over shirt    Upper body assist Assist Level: Minimal Assistance - Patient > 75%    Lower Body Dressing/Undressing Lower body dressing      What is the patient wearing?: Underwear/pull up     Lower body assist Assist for lower body dressing: Contact Guard/Touching assist     Toileting Toileting    Toileting assist Assist for toileting: Contact Guard/Touching assist     Transfers Chair/bed transfer  Transfers assist     Chair/bed transfer assist level: Minimal Assistance - Patient > 75% Chair/bed transfer assistive device: Programmer, multimedia   Ambulation assist   Ambulation activity did not occur: Safety/medical concerns  Assist level: Minimal Assistance - Patient > 75% Assistive device: No Device Max distance: 133ft   Walk 10 feet activity   Assist  Walk 10 feet activity did not occur: Safety/medical concerns        Walk 50 feet activity   Assist Walk 50 feet with 2 turns activity did not occur: Safety/medical concerns         Walk 150 feet activity   Assist Walk 150 feet activity did not occur:  Safety/medical concerns         Walk 10 feet on uneven surface  activity   Assist Walk 10 feet on uneven surfaces activity did not occur: Safety/medical concerns         Wheelchair     Assist Is the patient using a wheelchair?: No   Wheelchair activity did not occur: Safety/medical concerns         Wheelchair 50 feet with 2 turns activity    Assist    Wheelchair 50 feet with 2 turns activity did not occur: Safety/medical concerns       Wheelchair 150 feet activity     Assist  Wheelchair 150 feet activity did not occur: Safety/medical concerns       Blood pressure 135/73, pulse 63, temperature 98 F (36.7 C), temperature source Oral, resp. rate 18, height 5\' 6"  (1.676 m), weight 104.5 kg, last menstrual period 08/02/2013, SpO2 100 %.  Medical Problem List and Plan: 1. 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  Antithrombotics: -DVT/anticoagulation:  Mechanical:  Antiembolism stockings, knee (TED hose) Bilateral lower extremities             -antiplatelet therapy: Aspirin 81 mg daily Hand /wrist swelling s/p bilateral radial art cath  3. Pain Management: Tylenol as needed Mainly weak RUE - has pain and decreased muscular support right wrist will order wrist splint  4. Mood: Melatonin as needed             -antipsychotic agents: N/A 5. Neuropsych: This patient is capable of making decisions on  her own behalf. 6. Skin/Wound Care: Routine skin checks 7. Fluids/Electrolytes/Nutrition: Routine in and outs with follow-up chemistries BMP Latest Ref Rng & Units 07/24/2021 07/20/2021 07/07/2021  Glucose 70 - 99 mg/dL 201(H) 210(H) 211(H)  BUN 6 - 20 mg/dL 15 20 22   Creatinine 0.44 - 1.00 mg/dL 0.76 1.00 0.83  Sodium 135 - 145 mmol/L 139 139 140  Potassium 3.5 - 5.1 mmol/L 3.9 4.1 4.0  Chloride 98 - 111 mmol/L 107 103 106  CO2 22 - 32 mmol/L 25 25 25   Calcium 8.9 - 10.3 mg/dL 9.0 9.7 9.5    8.  Hypertension.   Continue Norvasc 10 mg daily,Cozaar 100 mg daily, Coreg 37.5 mg twice daily.  Vitals:   07/28/21 0101 07/28/21 0446  BP: 119/62 135/73  Pulse: 71 63  Resp: 17 18  Temp: 98.1 F (36.7 C) 98 F (36.7 C)  SpO2: 98% 100%  Controlled 1/24  9.  Diabetes mellitus.  Hemoglobin A1c 8.4.  SSI.  Patient on Glucophage 1000 mg daily, [place] prior to admission.  Resume as needed CBG (last 3)  Recent Labs    07/27/21 1650 07/27/21 2111 07/28/21 0556  GLUCAP 162* 164* 158*   Start insulin glargine 10U qhs. Increase metformin 500mg  BID , control improving   10.  Hyperlipidemia.  Continue Crestor 11.  Obesity.  BMI 37.28.  Dietary follow-up 12.  Question medical compliance.  Provide counseling 13.  GERD.  Protonix    LOS: 5 days A FACE TO FACE EVALUATION WAS PERFORMED  Charlett Blake 07/28/2021, 8:24 AM

## 2021-07-28 NOTE — Progress Notes (Addendum)
Speech Language Pathology Daily Session Note  Patient Details  Name: LANYIA JEWEL MRN: [id number] Date of Birth: 1961/02/13  Today's Date: 07/28/2021 SLP Individual Time: 0930-1015 SLP Individual Time Calculation (min): 45 min  Short Term Goals: Week 1: SLP Short Term Goal 1 (Week 1): STGs=LTGs due to ELOS  Skilled Therapeutic Interventions: Skilled ST treatment focused on cognitive goals. Patient greeted in wheelchair and agreeable to be taken downstairs to therapy gym for duration of session with emphasis on memory goals. SLP facilitated session by providing min A cues for memory strategies and recall using BITS. Pt achieved 65-70% accuracy for recall of novel word list up to 6 words in increasing sequential order, as well as increasing randomized order. It should be noted session took place in a mildly distracting environment which may have reduced selective attention. Afterwards pt expressed it was "a little hard to block out the noise."  Discussed strategies to maximize attention when able to modify environment. Word-finding appeared [facility] throughout session at the conversation level. Patient was returned to room and left in wheelchair with alarm activated and immediate needs within reach at end of session. Continue per current plan of care.      Pain Pain Assessment Pain Scale: 0-10 Pain Score: 0-No pain  Therapy/Group: Individual Therapy  Sherron Mummert T Janaysha Depaulo 07/28/2021, 10:10 AM

## 2021-07-28 NOTE — Progress Notes (Signed)
Physical Therapy Session Note  Patient Details  Name: Shannon Oliver MRN: [id number] Date of Birth: 1960-11-21  Today's Date: 07/28/2021 PT Individual Time: [phone] and [phone] PT Individual Time Calculation (min): 59 min and 38 min   Short Term Goals: Week 1:  PT Short Term Goal 1 (Week 1): Pt will perform bed mobility w/ S. PT Short Term Goal 2 (Week 1): Pt will perform sit to stand w/ minA PT Short Term Goal 3 (Week 1): Pt will perform static standing for 1 min w/ minA PT Short Term Goal 4 (Week 1): Pt will perform 15 ft ambulation w/ LRAD modA  Skilled Therapeutic Interventions/Progress Updates:    Session 1: Pt received sitting on EOB getting tennis shoes on independently. Pt agreeable to therapy session. Sit>stand EOB>RW with CGA/close supervision. Gait in room using RW to sink with CGA/close supervision - educated pt on safe AD management at sink. Standing at sink with supervision performed oral care without assist. MD in/out for morning assessment. Gait training ~273ft down to main therapy gym using RW with CGA (therapist bringing w/c in event of fatigue but not needed today) - demos slight R LE external rotation due to poor foot clearance during swing, cuing for improvement, otherwise slow gait speed but other gait mechanics WFL.  Gait training ~120ft, no UE support, with light min assist for balance - continues to have excessive R/L lateral trunk sway over stance limb likely due to hip weakness and decreased core activation.   Stair navigation training ascending/descending 4 steps x2 (6" height) using B HR support - min assist - pt self selected step-to pattern but therapist cued for reciprocal pattern to promote R LE NMR, slight R knee instability noted during descent but no buckle - pt reports significant difficulty lifting R LE up higher than the height of the step, due to R hip flexor weakness.   Dynamic gait training via side stepping using level 3 theraband (green)  resistance around LEs right above knees ~50ft down/back 2x - light min assist for balance - tactile cuing for weight shifting onto stance limb (especially L LE) to allow improved R LE stepping (especially when stepping towards L).   Gait training back up to room using RW as described above with CGA and continued cuing for improved R LE foot clearance during swing. Pt left seated in w/c with needs in reach and seat belt alarm on.    Session 2: Pt received sitting in w/c with her husband, Burman Nieves, present and pt agreeable to therapy session. Therapist donned 4lb ankle weight on R LE. Gait training ~131ft using RW with CGA - demos significantly decreased neural recruitment to clear R foot during swing while wearing weight requiring max verbal encouragement to increase clearance. Standing R LE NMR via repeated R LE foot taps to 7.5inch height step with B UE support on RW - CGA for steadying/safety - focusing on increased R hip flexor activation x10 reps wearing 4lb weight - continues to require significant verbal encouragement to achieve enough neural recruitment to place R foot onto step.   Doffed ankle weight. Gait training ~134ft, no UE support, with light min assist for balance and therapist facilitating slight R/L weight shifting onto stance limb - pt demos decreased R/L lateral trunk sway for a more stable stance control - improving R LE foot clearance - and overall a more fluid, continuous gait pattern.  Dynamic gait training using B UE support on therapist's forearms, therapist guarding pt from front, working  on directional changes via random forward/backwards and sideways gait - cotninues to have more difficulty stepping towards L compared to R due to challenge weight shifting L onto stance limb to allow her to bring R foot towards midline. Requires additional steps when turning to maintain balance and wide BOS.   Gait ~131ft back to room using RW with CGA and continued cuing for improved R LE gait  mechanics. Pt left seated on EOB with needs in reach, bed alarm on, and her husband present.  Therapy Documentation Precautions:  Precautions Precautions: Fall Precaution Comments: mild right hemiparesis Restrictions Weight Bearing Restrictions: No   Pain:  Session 1: Reports "slight" pain along L scapulo-thoracic joint that pt reports as "back" pain from a previous L UE injury - states she just has to have support when sitting to alleviate the pain but otherwise no interventions needed during session.  Session 2: No reports of pain throughout session.    Therapy/Group: Individual Therapy  Tawana Scale , PT, DPT, NCS, CSRS  07/28/2021, 7:41 AM

## 2021-07-28 NOTE — Progress Notes (Signed)
Received a call from [place] at [phone] reporting MRI Results.  Neurology was called spoke with Dr Leonel Ramsay, he stated " to continue to monitor, he reviewed her MRI results.  Spoke with Blanch Media regarding the above, she verbalizes understanding.

## 2021-07-29 LAB — GLUCOSE, CAPILLARY
Glucose-Capillary: 111 mg/dL — ABNORMAL HIGH (ref 70–99)
Glucose-Capillary: 131 mg/dL — ABNORMAL HIGH (ref 70–99)
Glucose-Capillary: 154 mg/dL — ABNORMAL HIGH (ref 70–99)
Glucose-Capillary: 182 mg/dL — ABNORMAL HIGH (ref 70–99)

## 2021-07-29 NOTE — Progress Notes (Signed)
Patient ID: Shannon Oliver, female   DOB: [birth year], 61 y.o.   MRN: [id number]  Team Conference Report to Patient/Family  Team Conference discussion was reviewed with the patient and caregiver, including goals, any changes in plan of care and target discharge date.  Patient and caregiver express understanding and are in agreement.  The patient has a target discharge date of 08/01/21.  SW met with patient and spouse provided confeence updates. Patient requesting OP referral. No additional questions or concerns, sw will continue to follow up.  Dyanne Iha 07/29/2021, 1:46 PM

## 2021-07-29 NOTE — Progress Notes (Signed)
PROGRESS NOTE   Subjective/Complaints:  CTA unchanged per report , awaiting neuro review, now on DAPT (ticagrelor) for 4 wks then ASA alone , discussed with pt   ROS- Bilateral hand and wrist swelling - improved per pt, denies CP, SOB N/V/D, Objective:   CT ANGIO HEAD NECK W WO CM  Result Date: 07/28/2021 CLINICAL DATA:  Stroke, determine embolic source, aneurysm cavernous portion of left ICA EXAM: CT ANGIOGRAPHY HEAD AND NECK TECHNIQUE: Multidetector CT imaging of the head and neck was performed using the standard protocol during bolus administration of intravenous contrast. Multiplanar CT image reconstructions and MIPs were obtained to evaluate the vascular anatomy. Carotid stenosis measurements (when applicable) are obtained utilizing NASCET criteria, using the distal internal carotid diameter as the denominator. RADIATION DOSE REDUCTION: This exam was performed according to the departmental dose-optimization program which includes automated exposure control, adjustment of the mA and/or kV according to patient size and/or use of iterative reconstruction technique. CONTRAST:  68mL OMNIPAQUE IOHEXOL 350 MG/ML SOLN COMPARISON:  CTA 07/20/2021, CT head 07/24/2021 FINDINGS: CT HEAD FINDINGS Brain: No evidence of acute infarction, hemorrhage, cerebral edema, mass, mass effect, or midline shift. No hydrocephalus or extra-axial fluid collection. Periventricular white matter changes, likely the sequela of chronic small vessel ischemic disease. Vascular: No hyperdense vessel. Aneurysmal coil at the left ICA terminus. Skull: Normal. Negative for fracture or focal lesion. Sinuses/Orbits: No acute finding. Other: The mastoid air cells are well aerated. CTA NECK FINDINGS Aortic arch: Two-vessel arch with a common origin of the brachiocephalic and left common carotid arteries. Imaged portion shows no evidence of aneurysm or dissection. No significant stenosis  of the major arch vessel origins. Right carotid system: No evidence of dissection, stenosis (50% or greater) or occlusion. Left carotid system: No evidence of dissection, stenosis (50% or greater) or occlusion. Vertebral arteries: No evidence of dissection, stenosis (50% or greater) or occlusion. Skeleton: No acute osseous abnormality. Other neck: None. Upper chest: No focal pulmonary opacity. Review of the MIP images confirms the above findings CTA HEAD FINDINGS Anterior circulation: Unchanged 3 mm superomedially projecting aneurysm arising from the distal cavernous right ICA (series 11, image 131), unchanged compared to 07/20/2021. Redemonstrated coiling of an aneurysm at the left ICA terminus at the origin of the left ACA and MCA (series 11, image 224). Both ICAs are otherwise patent to the termini without significant stenosis. Left A1 segment patent. Redemonstrated hypoplastic right A1. Normal anterior communicating artery. Anterior cerebral arteries are patent to their distal aspects. No M1 stenosis or occlusion. Normal MCA bifurcations. Distal MCA branches perfused and symmetric. Posterior circulation: Vertebral arteries patent to the vertebrobasilar junction without stenosis. Posterior inferior cerebral arteries patent bilaterally. Basilar patent to its distal aspect. Superior cerebellar arteries patent bilaterally. Bilateral P1 segments originate from the basilar artery. PCAs perfused to their distal aspects without stenosis. Bilateral posterior communicating arteries are visualized. Venous sinuses: As permitted by contrast timing, patent. Anatomic variants: None significant Review of the MIP images confirms the above findings IMPRESSION: 1. Unchanged 3 mm aneurysm projecting from the distal cavernous right ICA. No new aneurysm is seen. 2.  No intracranial large vessel occlusion or significant stenosis. 3.  No hemodynamically  significant stenosis in the neck. 4. Redemonstrated prior coiling of an aneurysm at  the left ICA terminus. Electronically Signed   By: Merilyn Baba M.D.   On: 07/28/2021 19:25   MR BRAIN WO CONTRAST  Result Date: 07/28/2021 CLINICAL DATA:  Stroke, follow-up EXAM: MRI HEAD WITHOUT CONTRAST TECHNIQUE: Multiplanar, multiecho pulse sequences of the brain and surrounding structures were obtained without intravenous contrast. COMPARISON:  07/21/2021 MRI FINDINGS: Brain: Again noted are multiple punctate foci of restricted diffusion in the bilateral cerebral hemispheres, with some new foci of restricted diffusion with ADC correlates: Left occipital lobe (series 2, image 17; left frontal lobe corona radiata (series 2, image 39); left body of the caudate (series 2, images 33 and 34). Additional foci of restricted diffusion correlate with those seen on the prior exam and are more likely to be subacute infarcts, although some of them continues to demonstrate diffusion restriction with persistent ADC correlates, such as the focus in the left external capsule (series 2, image 25), left occipital lobe (series 2, image 23 and 29), and left corona radiata (series 2, image 37). Other previously noted infarcts demonstrate pseudonormalization of the ADC. No acute hemorrhage, mass, mass effect, or midline shift. Confluent T2 hyperintense signal in the periventricular white matter, likely the sequela of moderate to severe chronic small vessel ischemic disease. No hydrocephalus or extra-axial collection. Vascular: Normal flow voids. Skull and upper cervical spine: Normal marrow signal. Sinuses/Orbits: Mucosal thickening in the ethmoid air cells. Status post left lens replacement. Other: The mastoids are well aerated. IMPRESSION: Punctate foci of restricted diffusion, consistent with acute and subacute infarcts, with new infarcts, compared to 07/21/2021, seen in the left occipital lobe, left corona radiata, and left body of the caudate. Given multiple vascular territories, an embolic etiology is suspected. These  results will be called to the ordering clinician or representative by the Radiologist Assistant, and communication documented in the PACS or [employer]. Electronically Signed   By: Merilyn Baba M.D.   On: 07/28/2021 00:13   No results for input(s): WBC, HGB, HCT, PLT in the last 72 hours.  No results for input(s): NA, K, CL, CO2, GLUCOSE, BUN, CREATININE, CALCIUM in the last 72 hours.   Intake/Output Summary (Last 24 hours) at 07/29/2021 0800 Last data filed at 07/28/2021 1758 Gross per 24 hour  Intake 480 ml  Output --  Net 480 ml         Physical Exam: Vital Signs Blood pressure (!) 153/77, pulse 68, temperature 97.9 F (36.6 C), temperature source Oral, resp. rate 18, height 5\' 6"  (1.676 m), weight 104.5 kg, last menstrual period 08/02/2013, SpO2 98 %.  General: No acute distress Mood and affect are appropriate Heart: Regular rate and rhythm no rubs murmurs or extra sounds Lungs: Clear to auscultation, breathing unlabored, no rales or wheezes Abdomen: Positive bowel sounds, soft nontender to palpation, nondistended Extremities: No clubbing, cyanosis, or edema   Skin: No evidence of breakdown, no evidence of rash Neurologic: Cranial nerves II through XII intact, motor strength is 5/5 in left  deltoid, bicep, tricep, grip, 5/5 left hip flexor, knee extensors, ankle dorsiflexor and plantar flexor 3- RUE, 4+/5 RLE  Sensory exam normal sensation to light touch in bilateral upper and lower extremities Fine motor reduced finger to thumb opposition on RIght side  Musculoskeletal: R>L hand and wrist edema bilateral hand and wrist ecchymosisimproving    Assessment/Plan: 1. Functional deficits which require 3+ hours per day of interdisciplinary therapy in a comprehensive inpatient rehab  setting. Physiatrist is providing close team supervision and 24 hour management of active medical problems listed below. Physiatrist and rehab team continue to assess barriers to  discharge/monitor patient progress toward functional and medical goals  Care Tool:  Bathing  Bathing activity did not occur:  (not observed at the time of assessment, the pt had completed the task prior to arrival.)           Bathing assist Assist Level: Minimal Assistance - Patient > 75% (MinA for BADLs per OT eval)     Upper Body Dressing/Undressing Upper body dressing   What is the patient wearing?: Pull over shirt    Upper body assist Assist Level: Minimal Assistance - Patient > 75%    Lower Body Dressing/Undressing Lower body dressing      What is the patient wearing?: Underwear/pull up     Lower body assist Assist for lower body dressing: Contact Guard/Touching assist     Toileting Toileting    Toileting assist Assist for toileting: Supervision/Verbal cueing     Transfers Chair/bed transfer  Transfers assist     Chair/bed transfer assist level: Contact Guard/Touching assist Chair/bed transfer assistive device: Programmer, multimedia   Ambulation assist   Ambulation activity did not occur: Safety/medical concerns  Assist level: Minimal Assistance - Patient > 75% Assistive device: No Device Max distance: 140ft   Walk 10 feet activity   Assist  Walk 10 feet activity did not occur: Safety/medical concerns        Walk 50 feet activity   Assist Walk 50 feet with 2 turns activity did not occur: Safety/medical concerns         Walk 150 feet activity   Assist Walk 150 feet activity did not occur: Safety/medical concerns         Walk 10 feet on uneven surface  activity   Assist Walk 10 feet on uneven surfaces activity did not occur: Safety/medical concerns         Wheelchair     Assist Is the patient using a wheelchair?: No   Wheelchair activity did not occur: Safety/medical concerns         Wheelchair 50 feet with 2 turns activity    Assist    Wheelchair 50 feet with 2 turns activity did not occur:  Safety/medical concerns       Wheelchair 150 feet activity     Assist  Wheelchair 150 feet activity did not occur: Safety/medical concerns       Blood pressure (!) 153/77, pulse 68, temperature 97.9 F (36.6 C), temperature source Oral, resp. rate 18, height 5\' 6"  (1.676 m), weight 104.5 kg, last menstrual period 08/02/2013, SpO2 98 %.  Medical Problem List and Plan: 1. 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  Antithrombotics: -DVT/anticoagulation:  Mechanical:  Antiembolism stockings, knee (TED hose) Bilateral lower extremities             -antiplatelet therapy: Aspirin 81 mg daily Hand /wrist swelling s/p bilateral radial art cath  3. Pain Management: Tylenol as needed Mainly weak RUE - has pain and decreased muscular support right wrist will order wrist splint  4. Mood: Melatonin as needed             -antipsychotic agents: N/A 5. Neuropsych: This patient is capable of making decisions on her own behalf. 6. Skin/Wound Care: Routine skin checks 7. Fluids/Electrolytes/Nutrition: Routine in and outs with follow-up chemistries BMP Latest Ref Rng & Units 07/24/2021 07/20/2021 07/07/2021  Glucose 70 - 99 mg/dL 201(H) 210(H) 211(H)  BUN 6 - 20 mg/dL 15 20 22   Creatinine 0.44 - 1.00 mg/dL 0.76 1.00 0.83  Sodium 135 - 145 mmol/L 139 139 140  Potassium 3.5 - 5.1 mmol/L 3.9 4.1  4.0  Chloride 98 - 111 mmol/L 107 103 106  CO2 22 - 32 mmol/L 25 25 25   Calcium 8.9 - 10.3 mg/dL 9.0 9.7 9.5    8.  Hypertension.   Continue Norvasc 10 mg daily,Cozaar 100 mg daily, Coreg 37.5 mg twice daily.  Vitals:   07/28/21 2021 07/29/21 0337  BP: 120/72 (!) 153/77  Pulse: 66 68  Resp: 18 18  Temp: 97.6 F (36.4 C) 97.9 F (36.6 C)  SpO2: 100% 79%  ELevated systolic this am monitor prior to med changes   9.  Diabetes mellitus.  Hemoglobin A1c 8.4.  SSI.  Patient on Glucophage 1000 mg daily, [place] prior to admission.  Resume as needed CBG (last 3)  Recent Labs    07/28/21 1641 07/28/21 2049 07/29/21 0531  GLUCAP 270* 168* 111*   Start insulin glargine 10U qhs. Increase metformin 500mg  BID , control improving   10.  Hyperlipidemia.  Continue Crestor 11.  Obesity.  BMI 37.28.  Dietary follow-up 12.  Question medical compliance.  Provide counseling 13.  GERD.  Protonix    LOS: 6 days A FACE TO FACE EVALUATION WAS PERFORMED  Charlett Blake 07/29/2021, 8:00 AM

## 2021-07-29 NOTE — Progress Notes (Signed)
Physical Therapy Session Note  Patient Details  Name: Shannon Oliver MRN: [id number] Date of Birth: [birth year]  Today's Date: 07/29/2021 PT Individual Time: 0626-9485 and 1640-1735 PT Individual Time Calculation (min): 41 min and 55 min  Short Term Goals: Week 1:  PT Short Term Goal 1 (Week 1): Pt will perform bed mobility w/ S. PT Short Term Goal 2 (Week 1): Pt will perform sit to stand w/ minA PT Short Term Goal 3 (Week 1): Pt will perform static standing for 1 min w/ minA PT Short Term Goal 4 (Week 1): Pt will perform 15 ft ambulation w/ LRAD modA  Skilled Therapeutic Interventions/Progress Updates:    Session 1: Pt received sitting EOB with MD exiting upon therapist arrival. Pt agreeable to therapy session reporting she didn't sleep well last night. Sitting EOB, donned tennis shoes without assist. Sit>stand from EOB, no AD, with CGA for safety but no instability noted.  Gait training, no UE support, ~328ft with light min assist for balance - continues to have excessive R/L lateral trunk lean over stance limb indicating weakness in hips and core as well as slight decrease in R foot clearance during swing though improving.  Dynamic gait training via side stepping ~16ft 2x each direction with level 3 green theraband resistance above knees - light min assist for balance and continued cuing to minimize lateral trunk leans while increasing speed and consistency of movement - with increased distance and fatigue pt demos some slight R knee instability in stance.   Educated pt on D/C plan with recommendation to use RW for all standing/ambulation to maintain her balance for safety and increase her independence with functional mobility. Educated on recommendation for OPPT follow-up. Educated on fall risk safety in the home including wearing tennis shoes and removing loose rugs.   Dynamic gait training via fast forward walking, sudden start/stops, and sudden R/L turning on verbal command - CGA with  intermittent light min assist for safety throughout but no overt LOB - only able to minimally increase gait speed when cued.  Dynamic gait training using agility ladder via forward reciprocal stepping working on increasing speed progressed to side stepping with pt unable to take large enough side steps to avoid stepping on reins of ladder - continued light min assist for balance throughout with no overt LOB.  At end of session, pt left seated in w/c with needs in reach and seat belt alarm on.    Session 2: Pt received sitting EOB and agreeable to therapy session. R stand pivot to w/c, no AD, with CGA. Transported to/from gym in w/c for time management and energy conservation.   Stepped on/off treadmill using litegait handles for UE support with CGA. Donned litegait harness while standing with B UE support and supervision for balance safety. Performed the following gait training tasks on treadmill using B UE support unless otherwise stated and harness only for safety, not body weight support. 1st: forward gait 5 minutes at 1.65mph starting on level surface progressing to level 2 then 5 incline  working towards pt's goal of ambulating outside uphill totaling - cuing for increased R LE foot clearance as needed  2nd: side stepping 20min each LE at 0.72mph totaling 121ft - continues to have more difficulty stepping towards L compared to R  3rd: forward without UE support for 44min47sec at 0.48mph totaling 170ft - pt continues to demo R/L lateral trunk lean over stance limb therefore therapist provided manual facilitation to maintain trunk stability throughout; however, pt unable  to sustain without facilitation 4th: backwards walking for 2 min at 0.28mph totaling 166ft - cuing for continuous, reciprocal stepping with focus on symmetry Pt does not require any physical assistance throughout locomotor treadmill training and response great to verbal cuing for improved gait mechanics. Doffed harness and stepped off  treadmill as described above. Transported to main therapy gym. Stair navigation training ascending/descending 4 steps x3 (6" height) using B HR support with CGA progressed to close supervision - pt reports significantly easier ability to lift R LE demonstrating improvement in R hip flexor activation/strength. Transported back to room. Stand pivot to EOB, no AD, with CGA for safety. Pt left seated on EOB with needs in reach and bed alarm on.    Therapy Documentation Precautions:  Precautions Precautions: Fall Precaution Comments: mild right hemiparesis Restrictions Weight Bearing Restrictions: No   Pain:  Session 1: Reports pain at IV site - nurse notified at end of session and present to remove it.  Session 2: No reports of pain throughout session.   Therapy/Group: Individual Therapy  Tawana Scale , PT, DPT, NCS, CSRS  07/29/2021, 7:48 AM

## 2021-07-29 NOTE — Patient Care Conference (Signed)
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 Alysia Penna Social Worker Present: Erlene Quan, BSW Nurse Present: Dorien Chihuahua, RN PT Present: Page Spiro, PT OT Present: Clyda Greener, OT SLP Present: Sherren Kerns, SLP PPS Coordinator present : Gunnar Fusi, SLP     Current Status/Progress Goal Weekly Team Focus  Bowel/Bladder   Pt is continent of bowel/bladder  Pt will remain continent of bowel/bladder  Will assess qshift and PRN   Swallow/Nutrition/ Hydration             ADL's   Supervision to min assist for UB selfcare with min assist for LB selfcare and transfers with use of the RW for support.  She continues to demonstrate decreased RUE coordination and strength at a 3+/5 level throughout.  Slight edema noted in the right hand but improving.  supervison with some modified independent  selfcare retraining, transfer training, DME education, balance retraining, pt/family education   Mobility   supervision bed mobility, CGA sit<>stand and stand pivot transfers using RW, CGA gait up to 279ft using RW otherwise light min assist without AD, light min assist 8 stair navigation using B HRs - continues to have R LE paresis proximal > distal  goals upgraded to  CGA/supervision overall at ambulatory level  pt education, activity tolerance, transfer training, DME training, dynamic gait training, dynamic standing balance, R LE NMR, stair navigation training   Communication   mod I  mod I  use of word finding strategies - minimal-to-no observation of difficulty during sessions   Safety/Cognition/ Behavioral Observations  sup-to-min A  mod I for memory  memory strategies   Pain   Pt's pain is a 3/4  Pt's pain is controlled on medication  Will assess qshift and PRN   Skin   Pt's skin is intact  Pt's skin will remain intact  Will assess qshift and PRN     Discharge Planning:  Discharging home with spouse and sister to assist. Spouse unable to provide physical assistance, 24/7 supervision   Team Discussion: Bil LE pain and Left LE weakness and right wrist edema and weakness. MRI noted new strokes (left subcortical and basal ganglia) and neurology added Brilinta to ASA. Patient on target to meet rehab goals: yes, currently supervision overall for upper body bathing and min assist for lower body bathing and dressing. Needs close supervision with a RW. Completes stand pivot transfers and ambulation with CGA. Needs light min assist for stair management. Goals for discharge set for supervision - [place] and progress notes for long and short-term goals.   Revisions to Treatment Plan:  Exercise program for right wrist strengthening   Teaching Needs: Safety, transfers, medication management, secondary stroke risk management, dietary modification, etc  Current Barriers to  Discharge: Decreased caregiver support and Home enviroment access/layout  Possible Resolutions to Barriers: Family education DME: RW     Medical Summary Current Status: workup for increased weakness  Barriers to Discharge: Medical stability   Possible Resolutions to Barriers/Weekly Focus: Edema control RUE, Neuro eval starting DAPT   Continued Need for Acute  Rehabilitation Level of Care: The patient requires daily medical management by a physician with specialized training in physical medicine and rehabilitation for the following reasons: Direction of a multidisciplinary physical rehabilitation program to maximize functional independence : Yes Medical management of patient stability for increased activity during participation in an intensive rehabilitation regime.: Yes Analysis of laboratory values and/or radiology reports with any subsequent need for medication adjustment and/or medical intervention. : Yes   I attest that I was present, lead the team conference, and concur with the assessment and plan of the team.   Dorien Chihuahua B 07/29/2021, 11:05 AM

## 2021-07-29 NOTE — Plan of Care (Signed)
°  Problem: RH Balance Goal: LTG Patient will maintain dynamic standing with ADLs (OT) Description: LTG:  Patient will maintain dynamic standing balance with assist during activities of daily living (OT)  Flowsheets (Taken 07/29/2021 1248) LTG: Pt will maintain dynamic standing balance during ADLs with: (goal downgraded for safety) Supervision/Verbal cueing Note: goal downgraded for safety

## 2021-07-29 NOTE — Progress Notes (Signed)
STROKE TEAM PROGRESS NOTE   INTERVAL HISTORY Her son is in the bedside. Pt lying in bed, watching TV. She has no acute event overnight and neuro stable. CTA head and neck unremarkable, left tICA aneurysm s/p coiling, no thrombus.   Vitals:   07/28/21 0446 07/28/21 1520 07/28/21 2021 07/29/21 0337  BP: 135/73 (!) 143/79 120/72 (!) 153/77  Pulse: 63 76 66 68  Resp: 18 18 18 18   Temp: 98 F (36.7 C) 98 F (36.7 C) 97.6 F (36.4 C) 97.9 F (36.6 C)  TempSrc: Oral Oral Oral Oral  SpO2: 100% 100% 100% 98%  Weight:      Height:       CBC:  Recent Labs  Lab 07/24/21 0536  WBC 5.8  NEUTROABS 2.8  HGB 11.1*  HCT 33.4*  MCV 89.1  PLT 017   Basic Metabolic Panel:  Recent Labs  Lab 07/24/21 0536  NA 139  K 3.9  CL 107  CO2 25  GLUCOSE 201*  BUN 15  CREATININE 0.76  CALCIUM 9.0   Lipid Panel:  No results for input(s): CHOL, TRIG, HDL, CHOLHDL, VLDL, LDLCALC in the last 168 hours. HgbA1c:  No results for input(s): HGBA1C in the last 168 hours. Urine Drug Screen: No results for input(s): LABOPIA, COCAINSCRNUR, LABBENZ, AMPHETMU, THCU, LABBARB in the last 168 hours.  Alcohol Level No results for input(s): ETH in the last 168 hours.  IMAGING past 24 hours CT ANGIO HEAD NECK W WO CM  Result Date: 07/28/2021 CLINICAL DATA:  Stroke, determine embolic source, aneurysm cavernous portion of left ICA EXAM: CT ANGIOGRAPHY HEAD AND NECK TECHNIQUE: Multidetector CT imaging of the head and neck was performed using the standard protocol during bolus administration of intravenous contrast. Multiplanar CT image reconstructions and MIPs were obtained to evaluate the vascular anatomy. Carotid stenosis measurements (when applicable) are obtained utilizing NASCET criteria, using the distal internal carotid diameter as the denominator. RADIATION DOSE REDUCTION: This exam was performed according to the departmental dose-optimization program which includes automated exposure control, adjustment of the  mA and/or kV according to patient size and/or use of iterative reconstruction technique. CONTRAST:  6mL OMNIPAQUE IOHEXOL 350 MG/ML SOLN COMPARISON:  CTA 07/20/2021, CT head 07/24/2021 FINDINGS: CT HEAD FINDINGS Brain: No evidence of acute infarction, hemorrhage, cerebral edema, mass, mass effect, or midline shift. No hydrocephalus or extra-axial fluid collection. Periventricular white matter changes, likely the sequela of chronic small vessel ischemic disease. Vascular: No hyperdense vessel. Aneurysmal coil at the left ICA terminus. Skull: Normal. Negative for fracture or focal lesion. Sinuses/Orbits: No acute finding. Other: The mastoid air cells are well aerated. CTA NECK FINDINGS Aortic arch: Two-vessel arch with a common origin of the brachiocephalic and left common carotid arteries. Imaged portion shows no evidence of aneurysm or dissection. No significant stenosis of the major arch vessel origins. Right carotid system: No evidence of dissection, stenosis (50% or greater) or occlusion. Left carotid system: No evidence of dissection, stenosis (50% or greater) or occlusion. Vertebral arteries: No evidence of dissection, stenosis (50% or greater) or occlusion. Skeleton: No acute osseous abnormality. Other neck: None. Upper chest: No focal pulmonary opacity. Review of the MIP images confirms the above findings CTA HEAD FINDINGS Anterior circulation: Unchanged 3 mm superomedially projecting aneurysm arising from the distal cavernous right ICA (series 11, image 131), unchanged compared to 07/20/2021. Redemonstrated coiling of an aneurysm at the left ICA terminus at the origin of the left ACA and MCA (series 11, image 224). Both ICAs are  otherwise patent to the termini without significant stenosis. Left A1 segment patent. Redemonstrated hypoplastic right A1. Normal anterior communicating artery. Anterior cerebral arteries are patent to their distal aspects. No M1 stenosis or occlusion. Normal MCA bifurcations.  Distal MCA branches perfused and symmetric. Posterior circulation: Vertebral arteries patent to the vertebrobasilar junction without stenosis. Posterior inferior cerebral arteries patent bilaterally. Basilar patent to its distal aspect. Superior cerebellar arteries patent bilaterally. Bilateral P1 segments originate from the basilar artery. PCAs perfused to their distal aspects without stenosis. Bilateral posterior communicating arteries are visualized. Venous sinuses: As permitted by contrast timing, patent. Anatomic variants: None significant Review of the MIP images confirms the above findings IMPRESSION: 1. Unchanged 3 mm aneurysm projecting from the distal cavernous right ICA. No new aneurysm is seen. 2.  No intracranial large vessel occlusion or significant stenosis. 3.  No hemodynamically significant stenosis in the neck. 4. Redemonstrated prior coiling of an aneurysm at the left ICA terminus. Electronically Signed   By: Merilyn Baba M.D.   On: 07/28/2021 19:25    PHYSICAL EXAM General:  Alert, well-nourished, well-developed female in no acute distress.  Swelling and bruising of right hand noted but improved from yesterday per patient   NEURO:  Mental Status: AA&Ox3  Speech/Language: speech is without dysarthria or aphasia.  Naming, fluency, and comprehension intact.  Cranial Nerves:  II: PERRL. Visual fields full.  III, IV, VI: EOMI. Eyelids elevate symmetrically.  V: Sensation is intact to light touch and symmetrical to face.  VII: Smile is symmetrical.   VIII: hearing intact to voice. IX, X:  Phonation is normal.  XII: tongue is midline without fasciculations. Motor: 5/5 strength to LLE and LUE RUE 5/5 proximal and 4/5 distal with hand grip. Fine motor slowing with right fingers.  RLE 4+/5 proximal and 5-/5 distal ankle PF/DF. Some giveaway weakness and lack of effort, may present functional component.  Tone: is normal and bulk is normal Sensation- Intact to light touch bilaterally.    Coordination: FTN intact bilaterally.  Gait- deferred   ASSESSMENT/PLAN Shannon Oliver is a 61 y.o. 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 After the procedure, she developed new onset left sided weakness and decreased sensation after the procedure.  TNKase was administered with improvement of symptoms.  24 hours post TNKase aspirin was resumed. MRI demonstrates scattered, mostly punctate, acute infarcts in bilateral cerebral hemispheres. Admitted to CIR on 07/23/2021. Bilaterally lower extremity weakness noted on 07/24/2021 which has now resolved. On exam, patient is weaker with her right upper extremity on 1/23. Today, 1/24 RLE is 2/5, unable to move against gravity without assistance. She can resist movement both proximally and distally. Plan for CTA to evaluate for a clot around the coiled area.    Stroke:  left MCA and b/l ACA scattered punctate infarcts likely related to the procedure for coiling of left terminal ICA aneurysm (azygos ACAs) Stroke: 2-3 new spot of left MCA infarcts concerning for s/p coiling off DAPT CT head 07/20/21 No acute abnormality. ASPECTS 10.    CTA head & neck 1/16 s/p coiling of left ICA aneurysm, no LVO or significant stenosis, 54mm right ICA aneurysm MRI 1/17 scattered small and punctate infarcts in bialteral cerbral hemispheres with no hemorrhage or mass effect, chronic small vessel disease Repeat MRI 1/22 - New infarcts at the left occipital lobe, left corona radiata, and left body of the caudate. CTA head and neck repeat unchanged,  no thrombus 2D Echo EF 77-93%, grade 1 diastolic dysfunction, no atrial level shunt LDL 69 HgbA1c 8.4 VTE prophylaxis - SCDs Was on ASA alone, now on aspirin and brilinta DAPT for 4 weeks and then ASA alone.  Therapy recommendations: CIR  S/p coiling of left ICA aneurysm S/p WEB device ASA resumed Now brilinta added CTA head and neck repeat coiling  present, no thrombus  Hypertension Home meds:  amlodipine 10 mg daily, carvedilol 25 mg daily, losartan 100 mg daily, spironolactone 25 mg daily Now on norvasc 10, coreg 37.5 bid, cozaar 100 Stable Long-term BP goal normotensive  Hyperlipidemia Home meds:  rosuvastatin 40 mg daily, resumed in hospital LDL 69, goal < 70 Continue statin at discharge  Diabetes type II Uncontrolled Home meds:  Farxiga 5 mg daily, insulin aspart 8 units TIDAC, metformin 1000 mg daily HgbA1c 8.4, goal < 7.0 CBGs SSI Close PCP follow-up for better DM control  Other Stroke Risk Factors Obesity, Body mass index is 37.18 kg/m., BMI >/= 30 associated with increased stroke risk, recommend weight loss, diet and exercise as appropriate  Obstructive sleep apnea, on CPAP at home  Other [facility] day # 6  Neurology will sign off. Please call with questions. Pt will follow up with Dr. Tomi Likens at [facility] on 09/28/21. Thanks for the consult.  Shannon Hawking, MD PhD Stroke Neurology 07/29/2021 2:10 PM      To contact Stroke Continuity provider, please refer to [email or web address] After hours, contact General Neurology

## 2021-07-29 NOTE — Progress Notes (Signed)
Occupational Therapy Session Note  Patient Details  Name: Shannon Oliver MRN: [id number] Date of Birth: [birth year]  Today's Date: 07/29/2021 OT Individual Time: 0272-5366 OT Individual Time Calculation (min): 60 min    Short Term Goals: Week 1:  OT Short Term Goal 1 (Week 1): Patient to demonstrate ModI to MinA  with all simple BADL related task incorporating a  RW for 95% safety during task perforance. OT Short Term Goal 1 - Progress (Week 1): Progressing toward goal Week 2:     Skilled Therapeutic Interventions/Progress Updates:    Patient sitting at EOB upon arrival pt transferred from sit to stand using the RW for additional balance during functional mobility to the sink area for getting water to water her plants.  The pt was able to come from stand to sitting EOB with CGA to S.  The pt was able to go from sit to stand in front of her bed with the bedside table placed in front for folding towel during static standing.  The pt complete a simple toileting task using her RW for functional mobility with S and managing her LB clothing items with SBA.  The pt was able to complete toileting with CGA.  The pt returned to her living quarters using the RW for balance and EOB for lunch with her family present, the bed side table and call light  in place and all additional needs addressed. The pt had no reported pain response at the time of treatment.   Therapy Documentation Precautions:  Precautions Precautions: Fall Precaution Comments: mild right hemiparesis Restrictions Weight Bearing Restrictions: No      Therapy/Group: Individual Therapy  [name] 07/29/2021, 12:56 PM

## 2021-07-29 NOTE — Progress Notes (Signed)
Physical Therapy Session Note  Patient Details  Name: Shannon Oliver MRN: [id number] Date of Birth: [birth year]  Today's Date: 07/29/2021 PT Individual Time: 1300-1325 PT Individual Time Calculation (min): 25 min   Short Term Goals: Week 1:  PT Short Term Goal 1 (Week 1): Pt will perform bed mobility w/ S. PT Short Term Goal 2 (Week 1): Pt will perform sit to stand w/ minA PT Short Term Goal 3 (Week 1): Pt will perform static standing for 1 min w/ minA PT Short Term Goal 4 (Week 1): Pt will perform 15 ft ambulation w/ LRAD modA  Skilled Therapeutic Interventions/Progress Updates:    Patient received in bed, agreeable to PT. She denies pain. She ambulated ~244ft with RW and light CGA. Trendelenburg gait with variable step length noted. Patient ambulating ~68ft with no AD and MinA with exacerbation of trendelenburg noted despite multimodal cuing to engage lateral hip stabilizers throughout stance phase of gait. Stance phase stability practice with toe tapping to target B LE. Increased difficulty completing task when weight was shifted onto L LE> RLE. Patient ambulating back to her room with RW and supervision. Bed alarm on, call light within reach, husband at bedside.   Therapy Documentation Precautions:  Precautions Precautions: Fall Precaution Comments: mild right hemiparesis Restrictions Weight Bearing Restrictions: No    Therapy/Group: Individual Therapy  Karoline Caldwell, PT, DPT, CBIS  07/29/2021, 7:40 AM

## 2021-07-30 LAB — GLUCOSE, CAPILLARY
Glucose-Capillary: 108 mg/dL — ABNORMAL HIGH (ref 70–99)
Glucose-Capillary: 187 mg/dL — ABNORMAL HIGH (ref 70–99)
Glucose-Capillary: 103 mg/dL — ABNORMAL HIGH (ref 70–99)
Glucose-Capillary: 232 mg/dL — ABNORMAL HIGH (ref 70–99)

## 2021-07-30 MED ORDER — METFORMIN HCL 850 MG PO TABS
850.0000 mg | ORAL_TABLET | Freq: Two times a day (BID) | ORAL | Status: DC
  Administered 2021-07-30 – 2021-07-31 (×2): 850 mg via ORAL
  Filled 2021-07-30 (×2): qty 1

## 2021-07-30 NOTE — Progress Notes (Signed)
Speech Language Pathology Daily Session Note  Patient Details  Name: Shannon Oliver MRN: [id number] Date of Birth: [birth year]  Today's Date: 07/30/2021 SLP Individual Time: 5366-4403 SLP Individual Time Calculation (min): 38 min  Short Term Goals: Week 1: SLP Short Term Goal 1 (Week 1): STGs=LTGs due to ELOS  Skilled Therapeutic Interventions: Skilled ST treatment focused on cognitive goals. SLP facilitated session by providing sup A verbal cues for organization with complex scheduling/deductive reasoning task. Pt was mod I for implementation of compensatory memory strategies to assist in recalling multiple appointments. Patient completed BID pillbox organization with modified independence to achieve 100% accuracy. Pt encouraged to double check pillbox for accuracy which was not initially done. 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 No word finding difficulty or decreased verbal fluency observed this date. Patient was left in wheelchair and passed off to OT for the following session. Continue per current plan of care.      Pain Pain Assessment Pain Scale: 0-10 (reports having "muscle soreness" from exercising)  Therapy/Group: Individual Therapy  Shannon Oliver 07/30/2021, 3:19 PM

## 2021-07-30 NOTE — Discharge Summary (Signed)
Physician Discharge Summary  Patient ID: Shannon Oliver MRN: [id number] DOB/AGE: 08-09-1960 61 y.o.  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 Multiplanar CT image reconstructions and MIPs were obtained to evaluate the vascular anatomy. Carotid stenosis measurements (when applicable) are obtained utilizing NASCET criteria, using the distal internal carotid diameter as the denominator. RADIATION DOSE REDUCTION: This exam was performed according to the departmental dose-optimization program which includes automated exposure control, adjustment of the mA and/or kV according to patient size and/or use of iterative reconstruction technique. CONTRAST:  70m OMNIPAQUE IOHEXOL 350 MG/ML SOLN COMPARISON:  CTA 07/20/2021, CT head 07/24/2021 FINDINGS: CT HEAD FINDINGS Brain: No evidence of acute infarction, hemorrhage, cerebral edema, mass, mass effect, or midline shift. No hydrocephalus or extra-axial fluid collection. Periventricular white matter changes, likely the sequela of chronic small vessel ischemic disease. Vascular: No hyperdense vessel. Aneurysmal coil at the left ICA terminus. Skull: Normal. Negative for fracture or focal lesion. Sinuses/Orbits: No acute finding. Other: The mastoid air cells are well aerated. CTA NECK FINDINGS Aortic arch:  Two-vessel arch with a common origin of the brachiocephalic and left common carotid arteries. Imaged portion shows no evidence of aneurysm or dissection. No significant stenosis of the major arch vessel origins. Right carotid system: No evidence of dissection, stenosis (50% or greater) or occlusion. Left carotid system: No evidence of dissection, stenosis (50% or greater) or occlusion. Vertebral arteries: No evidence of dissection, stenosis (50% or greater) or occlusion. Skeleton: No acute osseous abnormality. Other neck: None. Upper chest: No focal pulmonary opacity. Review of the MIP images confirms the above findings CTA HEAD FINDINGS Anterior circulation: Unchanged 3 mm superomedially projecting aneurysm arising from the distal cavernous right ICA (series 11, image 131), unchanged compared to 07/20/2021. Redemonstrated coiling of an aneurysm at the left ICA terminus at the origin of the left ACA and MCA (series 11, image 224). Both ICAs are otherwise patent to the termini without significant stenosis. Left A1 segment patent. Redemonstrated hypoplastic right A1. Normal anterior communicating artery. Anterior cerebral arteries are patent to their distal aspects. No M1 stenosis or occlusion. Normal MCA bifurcations. Distal MCA branches perfused and symmetric. Posterior circulation: Vertebral arteries patent to the vertebrobasilar junction without stenosis. Posterior inferior cerebral arteries patent bilaterally. Basilar patent to its distal aspect. Superior cerebellar arteries patent bilaterally. Bilateral P1 segments originate from the basilar artery. PCAs perfused to their distal aspects without stenosis. Bilateral posterior communicating arteries are visualized. Venous sinuses: As permitted by contrast timing, patent. Anatomic variants: None significant Review of the MIP images confirms the above findings IMPRESSION: 1. Unchanged 3 mm aneurysm projecting from the distal cavernous right ICA. No new aneurysm is  seen. 2.  No intracranial large vessel occlusion or significant stenosis. 3.  No hemodynamically significant stenosis in the neck. 4. Redemonstrated prior coiling of an aneurysm at the left ICA terminus. Electronically Signed   By: AMerilyn BabaM.D.   On: 07/28/2021 19:25   CT HEAD WO  CONTRAST (5MM)  Result Date: 07/24/2021 CLINICAL DATA:  Severe headache, hypertension, diabetes mellitus EXAM: CT HEAD WITHOUT CONTRAST TECHNIQUE: Contiguous axial images were obtained from the base of the skull through the vertex without intravenous contrast. RADIATION DOSE REDUCTION: This exam was performed according to the departmental dose-optimization program which includes automated exposure control, adjustment of the mA and/or kV according to patient size and/or use of iterative reconstruction technique. COMPARISON:  07/21/2021 FINDINGS: Brain: Normal ventricular morphology. No midline shift or mass effect. Small vessel chronic ischemic changes of deep cerebral white matter. No intracranial hemorrhage, mass lesion, or evidence of acute infarction. No extra-axial fluid collections. Vascular: No hyperdense vessels. Aneurysm coil at LEFT ICA bifurcation again seen. Skull: Intact Sinuses/Orbits: Clear Other: N/A IMPRESSION: Prior aneurysm coiling. Small vessel chronic ischemic changes of deep cerebral white matter. No acute intracranial abnormalities. Electronically Signed   By: Lavonia Dana M.D.   On: 07/24/2021 15:30   CT HEAD WO CONTRAST (5MM)  Result Date: 07/21/2021 CLINICAL DATA:  Stroke.  TNK follow-up EXAM: CT HEAD WITHOUT CONTRAST TECHNIQUE: Contiguous axial images were obtained from the base of the skull through the vertex without intravenous contrast. RADIATION DOSE REDUCTION: This exam was performed according to the departmental dose-optimization program which includes automated exposure control, adjustment of the mA and/or kV according to patient size and/or use of iterative reconstruction technique. COMPARISON:   MRI head 07/21/2021.  CT head 07/20/2021 FINDINGS: Brain: Negative for acute hemorrhage. Ventricle size normal. Patchy white matter hypodensity bilaterally Small areas of acute infarct identified on diffusion-weighted imaging are not well delineated on CT. Vascular: Negative for hyperdense vessel. Prior coiling of the terminal left ICA aneurysm. Skull: Negative Sinuses/Orbits: Mild mucosal edema paranasal sinuses. No orbital lesion. Left cataract extraction. Other: None IMPRESSION: 1. No acute intracranial hemorrhage 2. Diffuse white matter hypodensity compatible with ischemia. Small areas of restricted diffusion best seen on MRI today. Electronically Signed   By: Franchot Gallo M.D.   On: 07/21/2021 13:40   MR BRAIN WO CONTRAST  Result Date: 07/28/2021 CLINICAL DATA:  Stroke, follow-up EXAM: MRI HEAD WITHOUT CONTRAST TECHNIQUE: Multiplanar, multiecho pulse sequences of the brain and surrounding structures were obtained without intravenous contrast. COMPARISON:  07/21/2021 MRI FINDINGS: Brain: Again noted are multiple punctate foci of restricted diffusion in the bilateral cerebral hemispheres, with some new foci of restricted diffusion with ADC correlates: Left occipital lobe (series 2, image 17; left frontal lobe corona radiata (series 2, image 39); left body of the caudate (series 2, images 33 and 34). Additional foci of restricted diffusion correlate with those seen on the prior exam and are more likely to be subacute infarcts, although some of them continues to demonstrate diffusion restriction with persistent ADC correlates, such as the focus in the left external capsule (series 2, image 25), left occipital lobe (series 2, image 23 and 29), and left corona radiata (series 2, image 37). Other previously noted infarcts demonstrate pseudonormalization of the ADC. No acute hemorrhage, mass, mass effect, or midline shift. Confluent T2 hyperintense signal in the periventricular white matter, likely the sequela  of moderate to severe chronic small vessel ischemic disease. No hydrocephalus or extra-axial collection. Vascular: Normal flow voids. Skull and upper cervical spine: Normal marrow signal. Sinuses/Orbits: Mucosal thickening in the ethmoid air cells. Status post left lens replacement. Other: The mastoids are well aerated. IMPRESSION: Punctate foci of restricted diffusion, consistent with acute and subacute infarcts, with new infarcts, compared to 07/21/2021, seen in the left occipital lobe, left corona radiata,  and left body of the caudate. Given multiple vascular territories, an embolic etiology is suspected. These results will be called to the ordering clinician or representative by the Radiologist Assistant, and communication documented in the PACS or [employer]. Electronically Signed   By: Merilyn Baba M.D.   On: 07/28/2021 00:13   MR BRAIN WO CONTRAST  Result Date: 07/21/2021 CLINICAL DATA:  61 year old female status post endovascular embolization of left ICA terminus aneurysm with web device. Neurologic deficit. EXAM: MRI HEAD WITHOUT CONTRAST TECHNIQUE: Multiplanar, multiecho pulse sequences of the brain and surrounding structures were obtained without intravenous contrast. COMPARISON:  CT head without contrast and CTA head and neck 07/20/2021. FINDINGS: Brain: There are scattered small - generally punctate - foci of diffusion restriction in both cerebral hemispheres. Most are in the left hemisphere involving the left MCA and left PCA territory. Among the most conspicuous is a 6 mm focus at the left external capsule abutting the lentiform (series 5, image 87 and series 7, image 70). Small involvement also at the right genu of the corpus callosum (right ACA territory). Occasional right MCA involvement. Subtle T2 and FLAIR hyperintensity associated with the acute findings. With moderate superimposed widely scattered background white matter T2 and FLAIR hyperintensity. And mild to moderate associated  heterogeneity in the bilateral basal ganglia. But no cortical encephalomalacia identified. Trace if any chronic cerebral blood products. Mild susceptibility artifact at the left ICA terminus related to endovascular embolization. No restricted diffusion to suggest acute infarction. No midline shift, mass effect, evidence of mass lesion, ventriculomegaly, extra-axial collection or acute intracranial hemorrhage. Vascular: Major intracranial vascular flow voids are preserved. Skull and upper cervical spine: Negative visible cervical spine. Visualized bone marrow signal is within normal limits. Sinuses/Orbits: Postoperative changes to the left globe. Otherwise negative orbits. Paranasal Visualized paranasal sinuses and mastoids are stable and well aerated. Other: Mastoids are well aerated.  Negative visible scalp and face. IMPRESSION: 1. 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 Among the most conspicuous is a 6 mm lacune in the left external capsule abutting the lentiform. No associated hemorrhage or mass effect. 2. Underlying moderate for age signal changes in the brain compatible with chronic small vessel disease. Electronically Signed   By: Genevie Ann M.D.   On: 07/21/2021 05:30   IR Transcath/Emboliz  Result Date: 07/20/2021 INDICATION: Shannon Oliver is a 61 year old female with past medical history significant for diabetes, hyperlipidemia, hypertension and cataract. She presented to the emergency room with headache on 06/05/2021. 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 She underwent a diagnostic cerebral angiogram on 06/19/2021 that confirmed the presence of the 2 brain aneurysms seen on prior CTA. She comes to our service today for elective treatment of her left ICA aneurysm. In preparation to today's procedure, she was started on Brilinta 90 mg b.i.d. and aspirin 81 mg q.d. EXAM:  ULTRASOUND-GUIDED VASCULAR ACCESS DIAGNOSTIC CEREBRAL ANGIOGRAM ENDOVASCULAR ANEURYSM EMBOLIZATION FLAT PANEL HEAD CT COMPARISON:  Cerebral angiogram June 18, 2021. MEDICATIONS: Ancef 2 g IV. The antibiotic was administered within 1 hour of the procedure. ANESTHESIA/SEDATION: The study was performed in the general anesthesia. CONTRAST:  106 cc of Omnipaque 300 milligram/mL FLUOROSCOPY TIME:  Fluoroscopy Time: 42 minutes 48 seconds (1,843 mGy). COMPLICATIONS: Delayed - approximately one hour after procedure, patient developed left sided weakness, unclear whether procedure related given intervention in the left ICA. TECHNIQUE:  Informed written consent was obtained from the patient after a thorough discussion of the procedural risks, benefits and alternatives. All questions were addressed. Maximal Sterile Barrier Technique was utilized including caps, mask, sterile gowns, sterile gloves, sterile drape, hand hygiene and skin antiseptic. A timeout was performed prior to the initiation of the procedure. Using the modified Seldinger technique and a micropuncture kit, access was gained to the distal right radial artery at the anatomical snuffbox and a 7 French sheath was placed. Real-time ultrasound guidance was utilized for vascular access including the acquisition of a permanent ultrasound image documenting patency of the accessed vessel. Slow intra arterial infusion of 5,000 IU heparin, 5 mg Verapamil and 200 mcg nitroglycerin diluted in patient's own blood was performed. No significant fluctuation in patient's blood pressure seen. Then, a right radial artery angiogram was obtained via sheath side port. Next, a 5 [place] Simmons 2 glide catheter was navigated over a 0.035" Terumo Glidewire into the right subclavian artery under fluoroscopic guidance. The catheter was then advanced into the left common carotid artery. Frontal and lateral angiograms of the neck were obtained. Using biplane roadmap guidance, the catheter  was then advanced into the left internal carotid artery. Frontal, lateral, magnified waters and magnified lateral views of the head were obtained. FINDINGS: 1. Normal brachial artery branching pattern seen. No significant anatomical variation. The right radial artery caliber is adequate for vascular access. 2. 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 3. There is brisk vascular contrast filling of the bilateral ACA and left MCA vascular trees. The visualized dural sinuses are patent. PROCEDURE: The Simmons 2 glide catheter was exchanged over the wire and under biplane roadmap for a 7 [place] Rist catheter which was placed in the distal cervical segment of the left ICA. Magnified frontal and lateral angiograms of the head were obtained in the working projections. Then, a [place] EX intermediate catheter was navigated through the risk catheter and over a via 17 microcatheter and a synchro 2 micro guidewire into the cavernous segment of the right ICA. Magnified frontal and lateral angiograms of the head were obtained in the working projections. The via microcatheter was then navigated over the wire into the left ICA terminus aneurysm pouch. The wire was removed. Then, a 4 x 2 mm web SL device was deployed into the aneurysm pouch. Frontal and lateral magnified angiograms of the head were obtained in the working projections. Adequate positioning of the device was noted. The device was then detached under fluoroscopy. Follow-up left internal carotid artery angiograms with magnified frontal and lateral views of the head in the working projections showed stable position of the device within the aneurysm pouch. Left ICA angiograms with frontal and lateral views of the entire head were then obtained, showed no evidence of thromboembolic complication. Flat panel CT of the head was obtained and post processed in a separate workstation with concurrent attending physician  supervision. Selected images were sent to PACS. No evidence of hemorrhagic complication noted. The catheter was subsequently withdrawn. An inflatable band was placed and inflated over the right hand access site. The vascular sheath was withdrawn and the band was slowly deflated until brisk flow was noted through the arteriotomy site. At this point, the band was reinflated with additional 2 cc of air to obtain patent hemostasis. 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 No evidence of hemorrhagic of thromboembolic complication. PLAN: Patient will be admitted  to ICU for observation. Electronically Signed   By: Pedro Earls M.D.   On: 07/20/2021 15:53   IR Angiogram Follow Up Study  Result Date: 07/20/2021 INDICATION: Shannon Oliver is a 61 year old female with past medical history significant for diabetes, hyperlipidemia, hypertension and cataract. She presented to the emergency room with headache on 06/05/2021. 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 She underwent a diagnostic cerebral angiogram on 06/19/2021 that confirmed the presence of the 2 brain aneurysms seen on prior CTA. She comes to our service today for elective treatment of her left ICA aneurysm. In preparation to today's procedure, she was started on Brilinta 90 mg b.i.d. and aspirin 81 mg q.d. EXAM: ULTRASOUND-GUIDED VASCULAR ACCESS DIAGNOSTIC CEREBRAL ANGIOGRAM ENDOVASCULAR ANEURYSM EMBOLIZATION FLAT PANEL HEAD CT COMPARISON:  Cerebral angiogram June 18, 2021. MEDICATIONS: Ancef 2 g IV. The antibiotic was administered within 1 hour of the procedure. ANESTHESIA/SEDATION: The study was performed in the general anesthesia. CONTRAST:  106 cc of Omnipaque 300 milligram/mL FLUOROSCOPY TIME:  Fluoroscopy Time: 42 minutes 48 seconds (1,843 mGy). COMPLICATIONS: Delayed - approximately one hour  after procedure, patient developed left sided weakness, unclear whether procedure related given intervention in the left ICA. TECHNIQUE: Informed written consent was obtained from the patient after a thorough discussion of the procedural risks, benefits and alternatives. All questions were addressed. Maximal Sterile Barrier Technique was utilized including caps, mask, sterile gowns, sterile gloves, sterile drape, hand hygiene and skin antiseptic. A timeout was performed prior to the initiation of the procedure. Using the modified Seldinger technique and a micropuncture kit, access was gained to the distal right radial artery at the anatomical snuffbox and a 7 French sheath was placed. Real-time ultrasound guidance was utilized for vascular access including the acquisition of a permanent ultrasound image documenting patency of the accessed vessel. Slow intra arterial infusion of 5,000 IU heparin, 5 mg Verapamil and 200 mcg nitroglycerin diluted in patient's own blood was performed. No significant fluctuation in patient's blood pressure seen. Then, a right radial artery angiogram was obtained via sheath side port. Next, a 5 [place] Simmons 2 glide catheter was navigated over a 0.035" Terumo Glidewire into the right subclavian artery under fluoroscopic guidance. The catheter was then advanced into the left common carotid artery. Frontal and lateral angiograms of the neck were obtained. Using biplane roadmap guidance, the catheter was then advanced into the left internal carotid artery. Frontal, lateral, magnified waters and magnified lateral views of the head were obtained. FINDINGS: 1. Normal brachial artery branching pattern seen. No significant anatomical variation. The right radial artery caliber is adequate for vascular access. 2. 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 3. There is brisk vascular contrast filling of the bilateral ACA and left MCA vascular trees.  The visualized dural sinuses are patent. PROCEDURE: The Simmons 2 glide catheter was exchanged over the wire and under biplane roadmap for a 7 [place] Rist catheter which was placed in the distal cervical segment of the left ICA. Magnified frontal and lateral angiograms of the head were obtained in the working projections. Then, a [place] EX intermediate catheter was navigated through the risk catheter and over a via 17 microcatheter and a synchro 2 micro guidewire into the cavernous segment of the right ICA. Magnified frontal and lateral angiograms of the head were obtained in the working projections. The  via microcatheter was then navigated over the wire into the left ICA terminus aneurysm pouch. The wire was removed. Then, a 4 x 2 mm web SL device was deployed into the aneurysm pouch. Frontal and lateral magnified angiograms of the head were obtained in the working projections. Adequate positioning of the device was noted. The device was then detached under fluoroscopy. Follow-up left internal carotid artery angiograms with magnified frontal and lateral views of the head in the working projections showed stable position of the device within the aneurysm pouch. Left ICA angiograms with frontal and lateral views of the entire head were then obtained, showed no evidence of thromboembolic complication. Flat panel CT of the head was obtained and post processed in a separate workstation with concurrent attending physician supervision. Selected images were sent to PACS. No evidence of hemorrhagic complication noted. The catheter was subsequently withdrawn. An inflatable band was placed and inflated over the right hand access site. The vascular sheath was withdrawn and the band was slowly deflated until brisk flow was noted through the arteriotomy site. At this point, the band was reinflated with additional 2 cc of air to obtain patent hemostasis. IMPRESSION: Successful endovascular embolization of a left ICA terminus  aneurysm with a web device. No evidence of hemorrhagic of thromboembolic complication. PLAN: Patient will be admitted to ICU for observation. Electronically Signed   By: Pedro Earls M.D.   On: 07/20/2021 15:53   IR CT Head Ltd  Result Date: 07/20/2021 INDICATION: Shannon Oliver is a 61 year old female with past medical history significant for diabetes, hyperlipidemia, hypertension and cataract. She presented to the emergency room with headache on 06/05/2021. 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 She underwent a diagnostic cerebral angiogram on 06/19/2021 that confirmed the presence of the 2 brain aneurysms seen on prior CTA. She comes to our service today for elective treatment of her left ICA aneurysm. In preparation to today's procedure, she was started on Brilinta 90 mg b.i.d. and aspirin 81 mg q.d. EXAM: ULTRASOUND-GUIDED VASCULAR ACCESS DIAGNOSTIC CEREBRAL ANGIOGRAM ENDOVASCULAR ANEURYSM EMBOLIZATION FLAT PANEL HEAD CT COMPARISON:  Cerebral angiogram June 18, 2021. MEDICATIONS: Ancef 2 g IV. The antibiotic was administered within 1 hour of the procedure. ANESTHESIA/SEDATION: The study was performed in the general anesthesia. CONTRAST:  106 cc of Omnipaque 300 milligram/mL FLUOROSCOPY TIME:  Fluoroscopy Time: 42 minutes 48 seconds (1,843 mGy). COMPLICATIONS: Delayed - approximately one hour after procedure, patient developed left sided weakness, unclear whether procedure related given intervention in the left ICA. TECHNIQUE: Informed written consent was obtained from the patient after a thorough discussion of the procedural risks, benefits and alternatives. All questions were addressed. Maximal Sterile Barrier Technique was utilized including caps, mask, sterile gowns, sterile gloves, sterile drape, hand hygiene and skin antiseptic. A timeout was performed prior to the initiation of the procedure.  Using the modified Seldinger technique and a micropuncture kit, access was gained to the distal right radial artery at the anatomical snuffbox and a 7 French sheath was placed. Real-time ultrasound guidance was utilized for vascular access including the acquisition of a permanent ultrasound image documenting patency of the accessed vessel. Slow intra arterial infusion of 5,000 IU heparin, 5 mg Verapamil and 200 mcg nitroglycerin diluted in patient's own blood was performed. No significant fluctuation in patient's blood pressure seen. Then, a right radial artery angiogram was obtained via sheath side port. Next, a  5 [place] Simmons 2 glide catheter was navigated over a 0.035" Terumo Glidewire into the right subclavian artery under fluoroscopic guidance. The catheter was then advanced into the left common carotid artery. Frontal and lateral angiograms of the neck were obtained. Using biplane roadmap guidance, the catheter was then advanced into the left internal carotid artery. Frontal, lateral, magnified waters and magnified lateral views of the head were obtained. FINDINGS: 1. Normal brachial artery branching pattern seen. No significant anatomical variation. The right radial artery caliber is adequate for vascular access. 2. 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 3. There is brisk vascular contrast filling of the bilateral ACA and left MCA vascular trees. The visualized dural sinuses are patent. PROCEDURE: The Simmons 2 glide catheter was exchanged over the wire and under biplane roadmap for a 7 [place] Rist catheter which was placed in the distal cervical segment of the left ICA. Magnified frontal and lateral angiograms of the head were obtained in the working projections. Then, a [place] EX intermediate catheter was navigated through the risk catheter and over a via 17 microcatheter and a synchro 2 micro guidewire into the cavernous segment of the right ICA.  Magnified frontal and lateral angiograms of the head were obtained in the working projections. The via microcatheter was then navigated over the wire into the left ICA terminus aneurysm pouch. The wire was removed. Then, a 4 x 2 mm web SL device was deployed into the aneurysm pouch. Frontal and lateral magnified angiograms of the head were obtained in the working projections. Adequate positioning of the device was noted. The device was then detached under fluoroscopy. Follow-up left internal carotid artery angiograms with magnified frontal and lateral views of the head in the working projections showed stable position of the device within the aneurysm pouch. Left ICA angiograms with frontal and lateral views of the entire head were then obtained, showed no evidence of thromboembolic complication. Flat panel CT of the head was obtained and post processed in a separate workstation with concurrent attending physician supervision. Selected images were sent to PACS. No evidence of hemorrhagic complication noted. The catheter was subsequently withdrawn. An inflatable band was placed and inflated over the right hand access site. The vascular sheath was withdrawn and the band was slowly deflated until brisk flow was noted through the arteriotomy site. At this point, the band was reinflated with additional 2 cc of air to obtain patent hemostasis. 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 No evidence of hemorrhagic of thromboembolic complication. PLAN: Patient will be admitted to ICU for observation. Electronically Signed   By: Pedro Earls M.D.   On: 07/20/2021 15:53   ECHOCARDIOGRAM COMPLETE  Result Date: 07/20/2021    ECHOCARDIOGRAM REPORT   Patient Name:   Shannon Oliver Date of Exam: 07/20/2021 Medical Rec #:  [id number]        Height:       66.0 in Accession #:    [id number]       Weight:       231.0 lb Date of Birth:  1961-03-08        BSA:           2.126 m Patient Age:    77 years         BP:           138/80 mmHg Patient Gender: F  HR:           66 bpm. Exam Location:  Inpatient Procedure: 2D Echo Indications:    Stroke  History:        Patient has prior history of Echocardiogram examinations, most                 recent 10/30/2020. Abnormal ECG; Risk Factors:Diabetes,                 Hypertension and Dyslipidemia.  Sonographer:    Arlyss Gandy Referring Phys: [id number] [place]  1. Left ventricular ejection fraction, by estimation, is 65 to 70%. Left ventricular ejection fraction by PLAX is 66 %. The left ventricle has normal function. The left ventricle has no regional wall motion abnormalities. There is moderate asymmetric left ventricular hypertrophy of the basal-septal segment. Left ventricular diastolic parameters are consistent with Grade I diastolic dysfunction (impaired relaxation).  2. Right ventricular systolic function is normal. The right ventricular size is normal.  3. The mitral valve is grossly normal. Trivial mitral valve regurgitation.  4. The aortic valve is tricuspid. Aortic valve regurgitation is not visualized. Aortic valve sclerosis/calcification is present, without any evidence of aortic stenosis.  5. The inferior vena cava is normal in size with greater than 50% respiratory variability, suggesting right atrial pressure of 3 mmHg. Comparison(s): No prior Echocardiogram. 10/30/2020: LVEF 70-75%. FINDINGS  Left Ventricle: Left ventricular ejection fraction, by estimation, is 65 to 70%. Left ventricular ejection fraction by PLAX is 66 %. The left ventricle has normal function. The left ventricle has no regional wall motion abnormalities. The left ventricular internal cavity size was normal in size. There is moderate asymmetric left ventricular hypertrophy of the basal-septal segment. Left ventricular diastolic parameters are consistent with Grade I diastolic dysfunction (impaired relaxation).  Indeterminate filling pressures. Right Ventricle: The right ventricular size is normal. No increase in right ventricular wall thickness. Right ventricular systolic function is normal. Left Atrium: Left atrial size was normal in size. Right Atrium: Right atrial size was normal in size. Pericardium: There is no evidence of pericardial effusion. Mitral Valve: The mitral valve is grossly normal. Trivial mitral valve regurgitation. Tricuspid Valve: The tricuspid valve is grossly normal. Tricuspid valve regurgitation is trivial. Aortic Valve: The aortic valve is tricuspid. Aortic valve regurgitation is not visualized. Aortic valve sclerosis/calcification is present, without any evidence of aortic stenosis. Aortic valve mean gradient measures 7.0 mmHg. Aortic valve peak gradient measures 9.7 mmHg. Aortic valve area, by VTI measures 2.70 cm. Pulmonic Valve: The pulmonic valve was grossly normal. Pulmonic valve regurgitation is trivial. Aorta: The aortic root and ascending aorta are structurally normal, with no evidence of dilitation. Venous: The inferior vena cava is normal in size with greater than 50% respiratory variability, suggesting right atrial pressure of 3 mmHg. IAS/Shunts: No atrial level shunt detected by color flow Doppler.  LEFT VENTRICLE PLAX 2D LV EF:         Left            Diastology                ventricular     LV e' medial:    5.87 cm/s                ejection        LV E/e' medial:  12.0                fraction by     LV e' lateral:  8.70 cm/s                PLAX is 66      LV E/e' lateral: 8.1                %. LVIDd:         3.87 cm LVIDs:         2.49 cm LV PW:         1.22 cm LV IVS:        1.55 cm LVOT diam:     2.00 cm LV SV:         108 LV SV Index:   51 LVOT Area:     3.14 cm  RIGHT VENTRICLE RV S prime:     16.80 cm/s TAPSE (M-mode): 2.3 cm LEFT ATRIUM             Index LA diam:        2.70 cm 1.27 cm/m LA Vol (A2C):   55.4 ml 26.06 ml/m LA Vol (A4C):   34.1 ml 16.04 ml/m LA Biplane Vol:  44.3 ml 20.84 ml/m  AORTIC VALVE AV Area (Vmax):    2.94 cm AV Area (Vmean):   2.56 cm AV Area (VTI):     2.70 cm AV Vmax:           156.00 cm/s AV Vmean:          125.000 cm/s AV VTI:            0.399 m AV Peak Grad:      9.7 mmHg AV Mean Grad:      7.0 mmHg LVOT Vmax:         146.00 cm/s LVOT Vmean:        102.000 cm/s LVOT VTI:          0.343 m LVOT/AV VTI ratio: 0.86  AORTA Ao Root diam: 3.20 cm Ao Asc diam:  3.30 cm MITRAL VALVE MV Area (PHT): 2.12 cm     SHUNTS MV Decel Time: 357 msec     Systemic VTI:  0.34 m MV E velocity: 70.30 cm/s   Systemic Diam: 2.00 cm MV A velocity: 103.00 cm/s MV E/A ratio:  0.68 Lyman Bishop MD Electronically signed by Lyman Bishop MD Signature Date/Time: 07/20/2021/3:41:45 PM    Final    CT HEAD CODE STROKE WO CONTRAST`  Result Date: 07/20/2021 CLINICAL DATA:  Code stroke. EXAM: CT HEAD WITHOUT CONTRAST TECHNIQUE: Contiguous axial images were obtained from the base of the skull through the vertex without intravenous contrast. RADIATION DOSE REDUCTION: This exam was performed according to the departmental dose-optimization program which includes automated exposure control, adjustment of the mA and/or kV according to patient size and/or use of iterative reconstruction technique. COMPARISON:  06/08/2021 FINDINGS: Brain: No evidence of acute infarction, hemorrhage, cerebral edema, mass, mass effect, or midline shift. Ventricles and sulci are normal for age. No extra-axial fluid collection. Periventricular white matter changes, likely the sequela of chronic small vessel ischemic disease. Vascular: No hyperdense vessel or unexpected calcification. Skull: Normal. Negative for fracture or focal lesion. Sinuses/Orbits: Mucosal thickening in the maxillary sinuses. Status post left lens replacement. Other: The mastoid air cells are well aerated. ASPECTS [facility] Stroke Program Early CT Score) - Ganglionic level infarction (caudate, lentiform nuclei, internal capsule, insula, M1-M3  cortex): 7 - Supraganglionic infarction (M4-M6 cortex): 3 Total score (0-10 with 10 being normal): 10 IMPRESSION: 1. No acute intracranial process. 2. ASPECTS is 10 Code stroke imaging  results were communicated on 07/20/2021 at 11:56 am to provider Dr. Lorrin Goodell via secure text paging. Electronically Signed   By: Merilyn Baba M.D.   On: 07/20/2021 11:57   CT ANGIO HEAD NECK W WO CM (CODE STROKE)  Result Date: 07/20/2021 CLINICAL DATA:  Neuro deficit, stroke suspected EXAM: CT ANGIOGRAPHY HEAD AND NECK TECHNIQUE: Multidetector CT imaging of the head and neck was performed using the standard protocol during bolus administration of intravenous contrast. Multiplanar CT image reconstructions and MIPs were obtained to evaluate the vascular anatomy. Carotid stenosis measurements (when applicable) are obtained utilizing NASCET criteria, using the distal internal carotid diameter as the denominator. RADIATION DOSE REDUCTION: This exam was performed according to the departmental dose-optimization program which includes automated exposure control, adjustment of the mA and/or kV according to patient size and/or use of iterative reconstruction technique. CONTRAST:  33m OMNIPAQUE IOHEXOL 350 MG/ML SOLN COMPARISON:  06/05/2021 CTA head, correlation is also made with same day CT head FINDINGS: CT HEAD FINDINGS For noncontrast findings, please see same day CT head. CTA NECK FINDINGS Aortic arch: Two-vessel arch with a common origin of the brachiocephalic and left common carotid arteries. Imaged portion shows no evidence of aneurysm or dissection. No significant stenosis of the major arch vessel origins. Right carotid system: No evidence of dissection, stenosis (50% or greater) or occlusion. Calcified and noncalcified plaque at the bifurcation, which is not hemodynamically significant. Left carotid system: No evidence of dissection, stenosis (50% or greater) or occlusion. Calcified and noncalcified plaque at the bifurcation  which is not hemodynamically significant. Vertebral arteries: Codominant. No evidence of dissection, stenosis (50% or greater) or occlusion. Skeleton: No acute osseous abnormality. Other neck: Negative. Upper chest: No focal pulmonary opacity or pleural effusion. Review of the MIP images confirms the above findings CTA HEAD FINDINGS Anterior circulation: Both internal carotid arteries are patent to the termini. Unchanged 3 mm supero medially projecting aneurysm arising from the distal cavernous right ICA (series 7, image 244). Interval coiling of the previously noted superiorly projecting aneurysm arising from the left ICA terminus at the origin of the left ACA and MCA (series 7, image 224). A1 segments patent, with redemonstrated hypoplastic right A1. Normal anterior communicating artery. Anterior cerebral arteries are patent to their distal aspects. No M1 stenosis or occlusion. Normal MCA bifurcations. Distal MCA branches perfused and symmetric. Posterior circulation: Vertebral arteries patent to the vertebrobasilar junction without stenosis. Posterior inferior cerebral arteries patent bilaterally. Basilar patent to its distal aspect. Superior cerebellar arteries patent bilaterally. Bilateral P1 segments originate from the basilar artery. PCAs perfused to their distal aspects without stenosis. The bilateral posterior communicating arteries are patent. Venous sinuses: As permitted by contrast timing, patent. Anatomic variants: None significant Review of the MIP images confirms the above findings IMPRESSION: 1. 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 2.  No intracranial large vessel occlusion or significant stenosis. 3.  No hemodynamically significant stenosis in the neck. 4. Unchanged 3 mm aneurysm projecting from the distal cavernous right ICA. Code stroke imaging results were communicated on 07/20/2021 at 12:39 pm to provider Dr. KLorrin Goodellvia secure text paging. Electronically Signed   By:  AMerilyn BabaM.D.   On: 07/20/2021 12:40   IR ANGIO INTRA EXTRACRAN SEL INTERNAL CAROTID UNI R MOD SED  Result Date: 07/20/2021 INDICATION: Shannon Oliver is a 61year old female with past medical history significant for diabetes, hyperlipidemia, hypertension and cataract. She presented to the emergency room with headache on 06/05/2021. 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 She underwent a diagnostic cerebral angiogram on 06/19/2021 that confirmed the presence of the 2 brain aneurysms seen on prior CTA. She comes to our service today for elective treatment of her left ICA aneurysm. In preparation to today's procedure, she was started on Brilinta 90 mg b.i.d. and aspirin 81 mg q.d. EXAM: ULTRASOUND-GUIDED VASCULAR ACCESS DIAGNOSTIC CEREBRAL ANGIOGRAM ENDOVASCULAR ANEURYSM EMBOLIZATION FLAT PANEL HEAD CT COMPARISON:  Cerebral angiogram June 18, 2021. MEDICATIONS: Ancef 2 g IV. The antibiotic was administered within 1 hour of the procedure. ANESTHESIA/SEDATION: The study was performed in the general anesthesia. CONTRAST:  106 cc of Omnipaque 300 milligram/mL FLUOROSCOPY TIME:  Fluoroscopy Time: 42 minutes 48 seconds (1,843 mGy). COMPLICATIONS: Delayed - approximately one hour after procedure, patient developed left sided weakness, unclear whether procedure related given intervention in the left ICA. TECHNIQUE: Informed written consent was obtained from the patient after a thorough discussion of the procedural risks, benefits and alternatives. All questions were addressed. Maximal Sterile Barrier Technique was utilized including caps, mask, sterile gowns, sterile gloves, sterile drape, hand hygiene and skin antiseptic. A timeout was performed prior to the initiation of the procedure. Using the modified Seldinger technique and a micropuncture kit, access was gained to the distal right radial artery at the anatomical snuffbox  and a 7 French sheath was placed. Real-time ultrasound guidance was utilized for vascular access including the acquisition of a permanent ultrasound image documenting patency of the accessed vessel. Slow intra arterial infusion of 5,000 IU heparin, 5 mg Verapamil and 200 mcg nitroglycerin diluted in patient's own blood was performed. No significant fluctuation in patient's blood pressure seen. Then, a right radial artery angiogram was obtained via sheath side port. Next, a 5 [place] Simmons 2 glide catheter was navigated over a 0.035" Terumo Glidewire into the right subclavian artery under fluoroscopic guidance. The catheter was then advanced into the left common carotid artery. Frontal and lateral angiograms of the neck were obtained. Using biplane roadmap guidance, the catheter was then advanced into the left internal carotid artery. Frontal, lateral, magnified waters and magnified lateral views of the head were obtained. FINDINGS: 1. Normal brachial artery branching pattern seen. No significant anatomical variation. The right radial artery caliber is adequate for vascular access. 2. 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 3. There is brisk vascular contrast filling of the bilateral ACA and left MCA vascular trees. The visualized dural sinuses are patent. PROCEDURE: The Simmons 2 glide catheter was exchanged over the wire and under biplane roadmap for a 7 [place] Rist catheter which was placed in the distal cervical segment of the left ICA. Magnified frontal and lateral angiograms of the head were obtained in the working projections. Then, a [place] EX intermediate catheter was navigated through the risk catheter and over a via 17 microcatheter and a synchro 2 micro guidewire into the cavernous segment of the right ICA. Magnified frontal and lateral angiograms of the head were obtained in the working projections. The via microcatheter was then navigated over the wire  into the left ICA terminus aneurysm pouch. The wire was removed. Then, a 4 x 2 mm web SL device was deployed into the aneurysm pouch. Frontal and lateral magnified angiograms of the head were obtained in the working projections. Adequate positioning of the device was noted. The device was then detached under fluoroscopy. Follow-up  left internal carotid artery angiograms with magnified frontal and lateral views of the head in the working projections showed stable position of the device within the aneurysm pouch. Left ICA angiograms with frontal and lateral views of the entire head were then obtained, showed no evidence of thromboembolic complication. Flat panel CT of the head was obtained and post processed in a separate workstation with concurrent attending physician supervision. Selected images were sent to PACS. No evidence of hemorrhagic complication noted. The catheter was subsequently withdrawn. An inflatable band was placed and inflated over the right hand access site. The vascular sheath was withdrawn and the band was slowly deflated until brisk flow was noted through the arteriotomy site. At this point, the band was reinflated with additional 2 cc of air to obtain patent hemostasis. 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 No evidence of hemorrhagic of thromboembolic complication. PLAN: Patient will be admitted to ICU for observation. Electronically Signed   By: Pedro Earls M.D.   On: 07/20/2021 15:53    Labs:  Basic Metabolic Panel: Recent Labs  Lab 07/24/21 0536  NA 139  K 3.9  CL 107  CO2 25  GLUCOSE 201*  BUN 15  CREATININE 0.76  CALCIUM 9.0    CBC: Recent Labs  Lab 07/24/21 0536  WBC 5.8  NEUTROABS 2.8  HGB 11.1*  HCT 33.4*  MCV 89.1  PLT 216    CBG: Recent Labs  Lab 07/29/21 2116 07/30/21 0636 07/30/21 1205 07/30/21 1705 07/30/21 2105  GLUCAP 182* 108* 187* 103* 232*   Family history.   Mother with hypertension diabetes maternal aunt with breast cancer.  Denies any colon cancer esophageal cancer or rectal cancer  Brief HPI:   Shannon Oliver is a 61 y.o. right-handed female with history of diabetes mellitus hypertension hyperlipidemia obesity with BMI 37.28, OSA, question medical compliance.  Per chart review lives with spouse.  Independent prior to admission working as a Training and development officer at [employer].  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  Post procedure left side decreased sensation as well as left-sided weakness.  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  No associated hemorrhage or mass-effect.  CT angiogram head and neck no intracranial large vessel occlusion or significant stenosis.  Echocardiogram with ejection fraction of 65 to 70% no wall motion abnormalities grade 1 diastolic dysfunction.  Currently maintained on low-dose aspirin.  She did initially need Cleviprex for blood pressure control.  Therapy evaluations completed due to patient decreased functional mobility left side weakness was admitted for a comprehensive rehab program.   Hospital Course: Shannon Oliver was admitted to rehab 07/23/2021 for inpatient therapies to consist of PT, ST and OT at least three hours five days a week. Past admission physiatrist, therapy team and rehab RN have worked together to provide customized collaborative inpatient rehab.  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  She had an episode of bilateral lower extremity weakness on 1/20 lasting less than 1 day along with some  increased right upper extremity weakness.  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  Repeat CTA head and neck showed no new issues.  Neurology consulted Brilinta was added to her regimen of aspirin x30 days then aspirin alone.  Blood pressure controlled on Norvasc as well as Coreg and would need outpatient follow-up.  Blood sugars controlled hemoglobin A1c 8.4 with diabetic teaching.  Crestor ongoing for hyperlipidemia.  Obesity BMI 37.28 dietary follow-up.  GERD with Protonix as directed.  Questionable medical noncompliance she was counseled in regards to maintaining her medical regimen.   Blood pressures were monitored on TID basis and controlled  Diabetes has been monitored with ac/hs CBG checks and SSI was use prn for tighter BS control.    Rehab course: During patient's stay in rehab weekly team conferences were held to monitor patient's progress, set goals and discuss barriers to discharge. At admission, patient required minimal guard 100 feet rolling walker minimal assist sit to stand  Physical exam.  Blood pressure 135/63 pulse 65 temperature 96 respirations 17 oxygen saturations 99% room air Constitutional.  No acute distress HEENT Head.  Normocephalic and atraumatic Eyes.  Pupils round and reactive to light no discharge without nystagmus Neck.  Supple nontender no JVD without thyromegaly Cardiac regular rate rhythm any extra sounds or murmur heard Abdomen.  Soft nontender positive bowel sounds without rebound Respiratory effort normal no respiratory distress without wheeze Musculoskeletal. Comments.  Right upper extremity/right lower extremity 5/5 Left upper extremity 4+/5 in biceps triceps, wrist extension, grip, FA 4/5 Left lower extremity hip flexion knee extension knee flexion dorsiflexion and plantarflexion 4/5 Skin.  Intact Neurologic.  Alert makes eye contact with  examiner oriented x3 and follows commands.  Fair insight and awareness.  Sensation intact all 4 extremities.  He/She  has had improvement in activity tolerance, balance, postural control as well as ability to compensate for deficits. He/She has had improvement in functional use RUE/LUE  and RLE/LLE as well as improvement in awareness.  Sitting edge of bed donned shoes without assist.  Sit to stand from edge of bed no assistive device contact-guard for safety.  Ambulates 300 feet with light minimal assist for balance.  Dynamic gait training via fast forward walking suddenly starts and stops contact-guard.  ADLs patient able to go from sit to stand in front of her bed within the bedside table placed in front of her for folding towels during static standing.  Completed simple toileting task rolling walker functional mobility supervision in managing her lower body clothing items standby assist.  She was able to complete toileting with contact-guard.  Full family teaching completed plan discharged home       Disposition: Discharged home    Diet: Carb modified  Special Instructions: No driving smoking or alcohol  Medications at discharge 1.  Tylenol as needed 2.  Norvasc 10 mg p.o. daily 3.  Aspirin 81 mg p.o. daily 4.  Coreg 37.5 mg p.o. twice daily 5.  Cozaar 100 mg p.o. daily 6.  Glucophage 1000 mg p.o.  BID 7.  Multivitamin daily 8.  Protonix 40 mg p.o. daily 9.  Crestor 40 mg p.o. daily 10.Muro 2% ophthalmic solution 1 drop left eye daily as needed eye irritation 11.  Brilinta 90 mg p.o. twice daily until 08/26/2021 and stop 12.  Toujeo 300 units 13.  Farxiga 5 mg p.o. daily 14.  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   Specialty: Physical Medicine and Rehabilitation Why: Office to call for appointment Contact information: [place] [address] [phone]         Pedro Earls, MD Follow up.   Specialties: Radiology, Interventional Radiology Why: Call for appointment Contact information: [address] [place] [address] [phone]         Pieter Partridge, DO. Go on 08/31/2021.   Specialty: Neurology Contact information: [place] [address] [phone]                 Signed: Lavon Paganini [place] 07/31/2021, 5:24 AM

## 2021-07-30 NOTE — Progress Notes (Signed)
Physical Therapy Discharge Summary  Patient Details  Name: Shannon Oliver MRN: [id number] Date of Birth: [birth year]  Patient has met 9 of 9 long term goals due to improved activity tolerance, improved balance, improved postural control, increased strength, ability to compensate for deficits, functional use of  right upper extremity and right lower extremity, improved attention, improved awareness, and improved coordination.  Patient to discharge at an ambulatory level Modified Independent using RW with supervision for stair navigation and long distance gait. Patient's care partner is independent to provide the necessary physical assistance at discharge.  All goals met.  Recommendation:  Patient will benefit from ongoing skilled PT services in outpatient setting to continue to advance safe functional mobility, address ongoing impairments in R hemiparesis, dynamic standing balance, dynamic gait training, return to work training, and minimize fall risk.  Equipment: RW  Reasons for discharge: treatment goals met and discharge from hospital  Patient/family agrees with progress made and goals achieved: Yes  PT Discharge Precautions/Restrictions Precautions Precautions: Fall;Other (comment) Precaution Comments: mild right hemiparesis Restrictions Weight Bearing Restrictions: No Pain Pain Assessment Pain Scale: 0-10 (reports having "muscle soreness" from exercising) Pain Interference Pain Interference Pain Effect on Sleep: 4. Almost constantly (reports just has difficulty sleeping) Pain Interference with Therapy Activities: 1. Rarely or not at all Pain Interference with Day-to-Day Activities: 1. Rarely or not at all Vision/Perception  Vision - History Ability to See in Adequate Light: 0 Adequate Perception Perception: Within Functional Limits Praxis Praxis: Intact  Cognition Overall Cognitive Status: Impaired/Different from baseline Arousal/Alertness: Awake/alert Orientation  Level: Oriented X4 Year: 2023 Month: January Day of Week: Correct Attention: Focused;Sustained;Selective Focused Attention: Appears intact Sustained Attention: Appears intact Selective Attention: Appears intact Memory: Appears intact Awareness: Appears intact Problem Solving: Appears intact Safety/Judgment: Appears intact Sensation Sensation Light Touch: Appears Intact Hot/Cold: Not tested Proprioception: Appears Intact Stereognosis: Not tested Coordination Gross Motor Movements are Fluid and Coordinated: Yes Coordination and Movement Description: GM movements are WFL with no specific incoordination and only slight R LE weakness more proximal than distal Motor  Motor Motor: Other (comment) Motor - Discharge Observations: mild R hemiparesis, which has significantly improved since initial evaluation  Mobility Bed Mobility Bed Mobility: Supine to Sit;Sit to Supine Rolling Right: Independent Rolling Left: Independent Supine to Sit: Independent Sit to Supine: Independent Transfers Transfers: Sit to Stand;Stand to Sit;Stand Pivot Transfers Sit to Stand: Independent with assistive device Stand to Sit: Independent with assistive device Stand Pivot Transfers: Independent with assistive device Transfer (Assistive device): Rolling walker Locomotion  Gait Ambulation: Yes Gait Assistance: Supervision/Verbal cueing Assistive device: None Gait Gait: Yes Gait Pattern: Step-through pattern;Decreased step length - right;Decreased step length - left (increased lateral sway) High Level Ambulation High Level Ambulation: Backwards walking Stairs / Additional Locomotion Stairs: Yes Stairs Assistance: Supervision/Verbal cueing Stair Management Technique: Two rails Height of Stairs: 6 Ramp: Supervision/Verbal cueing Curb: Supervision/Verbal cueing Wheelchair Mobility Wheelchair Mobility: No  Trunk/Postural Assessment  Cervical Assessment Cervical Assessment: Within Functional  Limits Thoracic Assessment Thoracic Assessment: Within Functional Limits Lumbar Assessment Lumbar Assessment: Within Functional Limits Postural Control Postural Control: Within Functional Limits  Balance Balance Balance Assessed: Yes Standardized Balance Assessment Standardized Balance Assessment: Berg Balance Test Berg Balance Test Sit to Stand: Able to stand without using hands and stabilize independently Standing Unsupported: Able to stand 2 minutes with supervision Sitting with Back Unsupported but Feet Supported on Floor or Stool: Able to sit safely and securely 2 minutes Stand to Sit: Sits safely with minimal use of hands  Transfers: Able to transfer safely, minor use of hands Standing Unsupported with Eyes Closed: Able to stand 10 seconds safely Standing Ubsupported with Feet Together: Able to place feet together independently and stand for 1 minute with supervision From Standing, Reach Forward with Outstretched Arm: Can reach forward >12 cm safely (5") From Standing Position, Pick up Object from Floor: Able to pick up shoe safely and easily From Standing Position, Turn to Look Behind Over each Shoulder: Looks behind from both sides and weight shifts well Turn 360 Degrees: Able to turn 360 degrees safely but slowly Standing Unsupported, Alternately Place Feet on Step/Stool: Able to stand independently and complete 8 steps >20 seconds Standing Unsupported, One Foot in Front: Able to plae foot ahead of the other independently and hold 30 seconds Standing on One Leg: Able to lift leg independently and hold equal to or more than 3 seconds Total Score: 47 Static Sitting Balance Static Sitting - Balance Support: Feet supported Static Sitting - Level of Assistance: 7: Independent Dynamic Sitting Balance Dynamic Sitting - Balance Support: Feet supported Dynamic Sitting - Level of Assistance: 6: Modified independent (Device/Increase time) Static Standing Balance Static Standing -  Balance Support: During functional activity;Bilateral upper extremity supported (RW) Static Standing - Level of Assistance: 6: Modified independent (Device/Increase time) Dynamic Standing Balance Dynamic Standing - Balance Support: During functional activity;Bilateral upper extremity supported (RW) Dynamic Standing - Level of Assistance: 6: Modified independent (Device/Increase time);5: Stand by assistance Functional Gait  Assessment Gait assessed : Yes Gait Level Surface: Walks 20 ft in less than 5.5 sec, no assistive devices, good speed, no evidence for imbalance, normal gait pattern, deviates no more than 6 in outside of the 12 in walkway width. Change in Gait Speed: Able to smoothly change walking speed without loss of balance or gait deviation. Deviate no more than 6 in outside of the 12 in walkway width. Gait with Horizontal Head Turns: Performs head turns smoothly with no change in gait. Deviates no more than 6 in outside 12 in walkway width Gait with Vertical Head Turns: Performs head turns with no change in gait. Deviates no more than 6 in outside 12 in walkway width. Gait and Pivot Turn: Pivot turns safely in greater than 3 sec and stops with no loss of balance, or pivot turns safely within 3 sec and stops with mild imbalance, requires small steps to catch balance. Step Over Obstacle: Is able to step over one shoe box (4.5 in total height) without changing gait speed. No evidence of imbalance. Gait with Narrow Base of Support: Ambulates 4-7 steps. Gait with Eyes Closed: Walks 20 ft, uses assistive device, slower speed, mild gait deviations, deviates 6-10 in outside 12 in walkway width. Ambulates 20 ft in less than 9 sec but greater than 7 sec. Ambulating Backwards: Walks 20 ft, no assistive devices, good speed, no evidence for imbalance, normal gait Steps: Alternating feet, must use rail. Total Score: 24 Extremity Assessment      RLE Assessment RLE Assessment: Exceptions to  [facility] Active Range of Motion (AROM) Comments: WFL RLE Strength Right Hip Flexion: 3+/5 Right Knee Flexion: 4-/5 Right Knee Extension: 4+/5 Right Ankle Dorsiflexion: 4-/5 Right Ankle Plantar Flexion: 4/5 LLE Assessment LLE Assessment: Exceptions to [facility] Active Range of Motion (AROM) Comments: WFL General Strength Comments: assessed in sitting LLE Strength Left Hip Flexion: 4-/5 Left Knee Flexion: 4+/5 Left Knee Extension: 5/5 Left Ankle Dorsiflexion: 4/5 Left Ankle Plantar Flexion: 4/5    Carly M Pippin , PT, DPT,  NCS, CSRS 07/30/2021, 12:52 PM

## 2021-07-30 NOTE — Progress Notes (Signed)
PROGRESS NOTE   Subjective/Complaints: Seen ambulating well with therapy  Appreciate pharmacy d/c review  ROS- Bilateral hand and wrist swelling - improved per pt, denies CP, SOB N/V/D   Objective:   CT ANGIO HEAD NECK W WO CM  Result Date: 07/28/2021 CLINICAL DATA:  Stroke, determine embolic source, aneurysm cavernous portion of left ICA EXAM: CT ANGIOGRAPHY HEAD AND NECK TECHNIQUE: Multidetector CT imaging of the head and neck was performed using the standard protocol during bolus administration of intravenous contrast. Multiplanar CT image reconstructions and MIPs were obtained to evaluate the vascular anatomy. Carotid stenosis measurements (when applicable) are obtained utilizing NASCET criteria, using the distal internal carotid diameter as the denominator. RADIATION DOSE REDUCTION: This exam was performed according to the departmental dose-optimization program which includes automated exposure control, adjustment of the mA and/or kV according to patient size and/or use of iterative reconstruction technique. CONTRAST:  27mL OMNIPAQUE IOHEXOL 350 MG/ML SOLN COMPARISON:  CTA 07/20/2021, CT head 07/24/2021 FINDINGS: CT HEAD FINDINGS Brain: No evidence of acute infarction, hemorrhage, cerebral edema, mass, mass effect, or midline shift. No hydrocephalus or extra-axial fluid collection. Periventricular white matter changes, likely the sequela of chronic small vessel ischemic disease. Vascular: No hyperdense vessel. Aneurysmal coil at the left ICA terminus. Skull: Normal. Negative for fracture or focal lesion. Sinuses/Orbits: No acute finding. Other: The mastoid air cells are well aerated. CTA NECK FINDINGS Aortic arch: Two-vessel arch with a common origin of the brachiocephalic and left common carotid arteries. Imaged portion shows no evidence of aneurysm or dissection. No significant stenosis of the major arch vessel origins. Right carotid  system: No evidence of dissection, stenosis (50% or greater) or occlusion. Left carotid system: No evidence of dissection, stenosis (50% or greater) or occlusion. Vertebral arteries: No evidence of dissection, stenosis (50% or greater) or occlusion. Skeleton: No acute osseous abnormality. Other neck: None. Upper chest: No focal pulmonary opacity. Review of the MIP images confirms the above findings CTA HEAD FINDINGS Anterior circulation: Unchanged 3 mm superomedially projecting aneurysm arising from the distal cavernous right ICA (series 11, image 131), unchanged compared to 07/20/2021. Redemonstrated coiling of an aneurysm at the left ICA terminus at the origin of the left ACA and MCA (series 11, image 224). Both ICAs are otherwise patent to the termini without significant stenosis. Left A1 segment patent. Redemonstrated hypoplastic right A1. Normal anterior communicating artery. Anterior cerebral arteries are patent to their distal aspects. No M1 stenosis or occlusion. Normal MCA bifurcations. Distal MCA branches perfused and symmetric. Posterior circulation: Vertebral arteries patent to the vertebrobasilar junction without stenosis. Posterior inferior cerebral arteries patent bilaterally. Basilar patent to its distal aspect. Superior cerebellar arteries patent bilaterally. Bilateral P1 segments originate from the basilar artery. PCAs perfused to their distal aspects without stenosis. Bilateral posterior communicating arteries are visualized. Venous sinuses: As permitted by contrast timing, patent. Anatomic variants: None significant Review of the MIP images confirms the above findings IMPRESSION: 1. Unchanged 3 mm aneurysm projecting from the distal cavernous right ICA. No new aneurysm is seen. 2.  No intracranial large vessel occlusion or significant stenosis. 3.  No hemodynamically significant stenosis in the neck. 4. Redemonstrated prior coiling of an aneurysm  at the left ICA terminus. Electronically Signed    By: Merilyn Baba M.D.   On: 07/28/2021 19:25   No results for input(s): WBC, HGB, HCT, PLT in the last 72 hours.  No results for input(s): NA, K, CL, CO2, GLUCOSE, BUN, CREATININE, CALCIUM in the last 72 hours.   Intake/Output Summary (Last 24 hours) at 07/30/2021 1233 Last data filed at 07/30/2021 0800 Gross per 24 hour  Intake 720 ml  Output --  Net 720 ml        Physical Exam: Vital Signs Blood pressure 137/76, pulse 70, temperature 97.8 F (36.6 C), temperature source Oral, resp. rate 18, height 5\' 6"  (1.676 m), weight 104.5 kg, last menstrual period 08/02/2013, SpO2 94 %.  General: No acute distress Mood and affect are appropriate Heart: Regular rate and rhythm no rubs murmurs or extra sounds Lungs: Clear to auscultation, breathing unlabored, no rales or wheezes Abdomen: Positive bowel sounds, soft nontender to palpation, nondistended Extremities: No clubbing, cyanosis, or edema   Skin: No evidence of breakdown, no evidence of rash Neurologic: Cranial nerves II through XII intact, motor strength is 5/5 in left  deltoid, bicep, tricep, grip, 5/5 left hip flexor, knee extensors, ankle dorsiflexor and plantar flexor 3- RUE, 4+/5 RLE  Sensory exam normal sensation to light touch in bilateral upper and lower extremities Fine motor reduced finger to thumb opposition on RIght side  Musculoskeletal: R>L hand and wrist edema bilateral hand and wrist ecchymosisimproving  Ambulating with therapy   Assessment/Plan: 1. Functional deficits which require 3+ hours per day of interdisciplinary therapy in a comprehensive inpatient rehab setting. Physiatrist is providing close team supervision and 24 hour management of active medical problems listed below. Physiatrist and rehab team continue to assess barriers to discharge/monitor patient progress toward functional and medical goals  Care Tool:  Bathing  Bathing activity did not occur:  (not observed at the time of assessment, the pt  had completed the task prior to arrival.)           Bathing assist Assist Level: Minimal Assistance - Patient > 75% (MinA for BADLs per OT eval)     Upper Body Dressing/Undressing Upper body dressing   What is the patient wearing?: Pull over shirt    Upper body assist Assist Level: Minimal Assistance - Patient > 75%    Lower Body Dressing/Undressing Lower body dressing      What is the patient wearing?: Underwear/pull up     Lower body assist Assist for lower body dressing: Contact Guard/Touching assist     Toileting Toileting    Toileting assist Assist for toileting: Supervision/Verbal cueing     Transfers Chair/bed transfer  Transfers assist     Chair/bed transfer assist level: Supervision/Verbal cueing Chair/bed transfer assistive device: Programmer, multimedia   Ambulation assist   Ambulation activity did not occur: Safety/medical concerns  Assist level: Minimal Assistance - Patient > 75% Assistive device: No Device Max distance: 229ft   Walk 10 feet activity   Assist  Walk 10 feet activity did not occur: Safety/medical concerns        Walk 50 feet activity   Assist Walk 50 feet with 2 turns activity did not occur: Safety/medical concerns         Walk 150 feet activity   Assist Walk 150 feet activity did not occur: Safety/medical concerns         Walk 10 feet on uneven surface  activity   Assist Walk 10 feet  on uneven surfaces activity did not occur: Safety/medical concerns         Wheelchair     Assist Is the patient using a wheelchair?: No   Wheelchair activity did not occur: Safety/medical concerns         Wheelchair 50 feet with 2 turns activity    Assist    Wheelchair 50 feet with 2 turns activity did not occur: Safety/medical concerns       Wheelchair 150 feet activity     Assist  Wheelchair 150 feet activity did not occur: Safety/medical concerns       Blood pressure 137/76,  pulse 70, temperature 97.8 F (36.6 C), temperature source Oral, resp. rate 18, height 5\' 6"  (1.676 m), weight 104.5 kg, last menstrual period 08/02/2013, SpO2 94 %.  Medical Problem List and Plan: 1. 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  Antithrombotics: -DVT/anticoagulation:  Mechanical:  Antiembolism stockings, knee (TED hose) Bilateral lower extremities             -antiplatelet therapy: Aspirin 81 mg daily Hand /wrist swelling s/p bilateral radial art cath  3. Pain Management: Tylenol as needed Mainly weak RUE - has pain and decreased muscular support right wrist will order wrist splint  4. Mood: Melatonin as needed             -antipsychotic agents: N/A 5. Neuropsych: This patient is capable of making decisions on her own behalf. 6. Skin/Wound Care: Routine skin checks 7. Fluids/Electrolytes/Nutrition: Routine in and outs with follow-up chemistries BMP Latest Ref Rng & Units 07/24/2021 07/20/2021 07/07/2021  Glucose 70 - 99 mg/dL 201(H) 210(H) 211(H)  BUN 6 - 20 mg/dL 15 20 22   Creatinine 0.44 - 1.00 mg/dL 0.76 1.00 0.83  Sodium 135 - 145 mmol/L 139 139 140  Potassium 3.5 - 5.1 mmol/L 3.9 4.1 4.0  Chloride 98 - 111 mmol/L 107 103 106  CO2 22 - 32 mmol/L 25 25 25   Calcium 8.9 - 10.3 mg/dL 9.0 9.7 9.5    8.  Hypertension.   Continue Norvasc 10 mg daily,Cozaar 100 mg daily,  Coreg 37.5 mg twice daily.  Vitals:   07/29/21 1940 07/30/21 0310  BP: 130/70 137/76  Pulse: 74 70  Resp: 18 18  Temp: 97.9 F (36.6 C) 97.8 F (36.6 C)  SpO2: 100% 81%  ELevated systolic this am monitor prior to med changes   9.  Diabetes mellitus.  Hemoglobin A1c 8.4.  SSI.  Patient on Glucophage 1000 mg daily, Toujeo prior to admission.  Resume as needed CBG (last 3)  Recent Labs    07/29/21 2116 07/30/21 0636 07/30/21 1205  GLUCAP 182* 108* 187*  Start insulin glargine 10U qhs. Increase metformin 5850mg  BID , control improving  10.  Hyperlipidemia.  Continue Crestor 11.  Obesity.  BMI 37.28.  Dietary follow-up 12.  Question medical compliance.  Provide counseling 13.  GERD.  continue Protonix    LOS: 7 days A FACE TO FACE EVALUATION WAS PERFORMED  Izora Ribas 07/30/2021, 12:33 PM

## 2021-07-30 NOTE — Progress Notes (Signed)
Physical Therapy Session Note  Patient Details  Name: Shannon Oliver MRN: [id number] Date of Birth: 03/21/1961  Today's Date: 07/30/2021 PT Individual Time: 0800-0845 PT Individual Time Calculation (min): 45 min   Short Term Goals: Week 1:  PT Short Term Goal 1 (Week 1): Pt will perform bed mobility w/ S. PT Short Term Goal 2 (Week 1): Pt will perform sit to stand w/ minA PT Short Term Goal 3 (Week 1): Pt will perform static standing for 1 min w/ minA PT Short Term Goal 4 (Week 1): Pt will perform 15 ft ambulation w/ LRAD modA Week 2:     Skilled Therapeutic Interventions/Progress Updates:      Therapy Documentation Precautions:  Precautions Precautions: Fall Precaution Comments: mild right hemiparesis Restrictions Weight Bearing Restrictions: No    Balance: Balance Balance Assessed: Yes Standardized Balance Assessment Standardized Balance Assessment: Berg Balance Test Berg Balance Test Sit to Stand: Able to stand without using hands and stabilize independently Standing Unsupported: Able to stand 2 minutes with supervision Sitting with Back Unsupported but Feet Supported on Floor or Stool: Able to sit safely and securely 2 minutes Stand to Sit: Sits safely with minimal use of hands Transfers: Able to transfer safely, minor use of hands Standing Unsupported with Eyes Closed: Able to stand 10 seconds safely Standing Ubsupported with Feet Together: Able to place feet together independently and stand for 1 minute with supervision From Standing, Reach Forward with Outstretched Arm: Can reach forward >12 cm safely (5") From Standing Position, Pick up Object from Floor: Able to pick up shoe safely and easily From Standing Position, Turn to Look Behind Over each Shoulder: Looks behind from both sides and weight shifts well Turn 360 Degrees: Able to turn 360 degrees safely but slowly Standing Unsupported, Alternately Place Feet on Step/Stool: Able to stand independently and  complete 8 steps >20 seconds Standing Unsupported, One Foot in Front: Able to plae foot ahead of the other independently and hold 30 seconds Standing on One Leg: Able to lift leg independently and hold equal to or more than 3 seconds Total Score: 47 Exercises:   Other Treatments:    Reviewed BEFAST and discussed risk factors of CVA, importance of immediate care.  Gait > 266ft w/sudden stops/starts, slow marching, pivot turns, cervical rotation, changes in speed all w/close supervision, decreased stance time L w/fatigue  Alternating forward lunges x 10, cues to increase pace 5/10 Alternating lateral lunges x 10, cues to increase pace 5/10 Overall cga  Pt ambulated to bathroom, performed commode transfer, voided, ambulated to sink and washed hands then transferred to wc w/supervision only. Pt left oob in wc w/alarm belt set and needs in reach  Therapy/Group: Individual Therapy [name], [place] 07/30/2021, 8:50 AM

## 2021-07-30 NOTE — Progress Notes (Addendum)
Inpatient Rehabilitation Discharge Medication Review by a Pharmacist  A complete drug regimen review was completed for this patient to identify any potential clinically significant medication issues.  High Risk Drug Classes Is patient taking? Indication by Medication  Antipsychotic No   Anticoagulant No   Antibiotic No   Opioid No   Antiplatelet Yes Brilinta/Aspirin x 4 weeks then Aspirin alone  Hypoglycemics/insulin Yes Toujeo, Fiasp, Metformin, Farxiga for DM  Vasoactive Medication Yes Carvedilol, Norvasc, losartan for BP  Chemotherapy No   Other Yes Crestor for HLD     Type of Medication Issue Identified Description of Issue Recommendation(s)  Drug Interaction(s) (clinically significant)     Duplicate Therapy     Allergy     No Medication Administration End Date     Incorrect Dose     Additional Drug Therapy Needed     Significant med changes from prior encounter (inform family/care partners about these prior to discharge).    Other       Clinically significant medication issues were identified that warrant physician communication and completion of prescribed/recommended actions by midnight of the next day:  No  Pharmacist comments: Resume spiro when appropriate  Time spent performing this drug regimen review (minutes): 20 minutes   Tad Moore 07/30/2021 9:08 AM

## 2021-07-30 NOTE — Progress Notes (Signed)
Physical Therapy Session Note  Patient Details  Name: Shannon Oliver MRN: [id number] Date of Birth: [birth year]  Today's Date: 07/30/2021 PT Individual Time: 1407-1500 PT Individual Time Calculation (min): 53 min   Short Term Goals: Week 1:  PT Short Term Goal 1 (Week 1): Pt will perform bed mobility w/ S. PT Short Term Goal 2 (Week 1): Pt will perform sit to stand w/ minA PT Short Term Goal 3 (Week 1): Pt will perform static standing for 1 min w/ minA PT Short Term Goal 4 (Week 1): Pt will perform 15 ft ambulation w/ LRAD modA  Skilled Therapeutic Interventions/Progress Updates:    Pt received supine in bed with her husband, [name], present and pt agreeable to therapy session. Pt reports feeling "weird" and "tired" today - pt with difficulty expressing how she feels but just overall reports feeling "different" today - she states she thought she would feel better today because she felt so good yesterday with therapist educating her on normal to feel tired after a day of increased activity - per chart review vitals WNL and pt with no change in status during session.  Pt/husband report no questions/concerns at this time regarding upcoming D/C. Sit<>stands using RW mod-I throughout session. Gait training ~244ft using RW down to 4th floor main therapy gym with supervision for safety - pt demoing improving gait speed and consistent improvement in R LE foot clearance during swing.   Pt performed 1 set of each of the following exercises with therapist educating pt on safe set-up in home environment and when to obtain family supervision for her safety. Performed step-ups at 1st 6" height stair using B HRs with supervision and no signs of LOB. Thearpist provided pt with HEP printout.   Access Code: [email or web address] URL: [email or web address] Date: 07/30/2021 Prepared by: Page Spiro  Exercises Supine Knee to Chest with Leg Straight - 1 x daily - 7 x weekly - 2 sets - 15 reps Supine Bridge  with Resistance Band - 1 x daily - 7 x weekly - 2 sets - 15 reps Standing March with Counter Support - 1 x daily - 7 x weekly - 2 sets - 15 reps Side Stepping with Resistance at Thighs and Counter Support - 1 x daily - 7 x weekly - 2 sets - 15 reps Forward Step Up with Counter Support - 1 x daily - 7 x weekly - 2 sets - 15 reps Walking - 1 x daily - 7 x weekly - 2 sets - 5 minutes hold  Gait training ~230ft back to room using RW with supervision for safety as noted above. Pt left seated EOB with her husband present. Pt cleared to transfer in/out bathroom using RW with her husband's supervision - NT notified of update to safety plan.    Therapy Documentation Precautions:  Precautions Precautions: Fall Precaution Comments: mild right hemiparesis Restrictions Weight Bearing Restrictions: No   Pain: No reports of pain throughout session.   Therapy/Group: Individual Therapy  Tawana Scale , PT, DPT, NCS, CSRS  07/30/2021, 12:48 PM

## 2021-07-30 NOTE — Progress Notes (Signed)
Occupational Therapy Session Note  Patient Details  Name: Shannon Oliver MRN: [id number] Date of Birth: 03-21-1961  Today's Date: 07/30/2021 OT Individual Time: 5465-6812 OT Individual Time Calculation (min): 60 min    Short Term Goals: Week 1:  OT Short Term Goal 1 (Week 1): Patient to demonstrate ModI to MinA  with all simple BADL related task incorporating a  RW for 95% safety during task perforance. OT Short Term Goal 1 - Progress (Week 1): Progressing toward goal Week 2:     Skilled Therapeutic Interventions/Progress Updates:  Patient continued to work on functional task performance in standing to improve her endurance for BADL and IADL related task.  The pt completed a folding task in standing and was able to demonstrate good dynamic standing balance and good safey with functional mobility for placement of laundry.  The pt completed a item retrieval activity using the long handle reacher, requiring the pt to demonstrate flexion and extension during functional mobility free of a device. The pt was instructed in regards to recognizing how her body feels in relation to fatigue or being dizzy and know when to take a rest break.  The pt was able to simulate LB dressing using theraband, coming from sit to stand with S only for donning the theraband.  The pt was able to remove and place her socks and shoes on at w/c LOF as well with S.  The pt's presented with BP of 136/71, pulse of 73 and O2 saturation of 100%.  The pt was able to demonstrate HEP for UB strengthening using theraband for chest press, shld flexion, and horizontal abduction.  The pt's family was present at the time of treatment for effective carryover.  The call light was in place, with the bedside table within reach the pt had no c/o pain, but indicated she was a little tired.     Therapy Documentation Precautions:  Precautions Precautions: Fall Precaution Comments: mild right hemiparesis Restrictions Weight Bearing  Restrictions: No General:   Vital Signs:   Pain: Pain Assessment Pain Score: 0-No pain   Therapy/Group: Individual Therapy  [name] 07/30/2021, 12:38 PM

## 2021-07-31 ENCOUNTER — Other Ambulatory Visit (HOSPITAL_COMMUNITY): Payer: Self-pay

## 2021-07-31 LAB — BASIC METABOLIC PANEL
Anion gap: 8 (ref 5–15)
BUN: 12 mg/dL (ref 6–20)
CO2: 23 mmol/L (ref 22–32)
Calcium: 9.2 mg/dL (ref 8.9–10.3)
Chloride: 107 mmol/L (ref 98–111)
Creatinine, Ser: 0.93 mg/dL (ref 0.44–1.00)
GFR, Estimated: >60 mL/min (ref >60)
Glucose, Bld: 151 mg/dL — ABNORMAL HIGH (ref 70–99)
Potassium: 3.5 mmol/L (ref 3.5–5.1)
Sodium: 138 mmol/L (ref 135–145)

## 2021-07-31 LAB — GLUCOSE, CAPILLARY
Glucose-Capillary: 131 mg/dL — ABNORMAL HIGH (ref 70–99)
Glucose-Capillary: 143 mg/dL — ABNORMAL HIGH (ref 70–99)
Glucose-Capillary: 190 mg/dL — ABNORMAL HIGH (ref 70–99)
Glucose-Capillary: 157 mg/dL — ABNORMAL HIGH (ref 70–99)

## 2021-07-31 MED ORDER — METFORMIN HCL 850 MG PO TABS
850.0000 mg | ORAL_TABLET | Freq: Two times a day (BID) | ORAL | 0 refills | Status: DC
  Filled 2021-07-31: qty 60, 30d supply, fill #0

## 2021-07-31 MED ORDER — PANTOPRAZOLE SODIUM 40 MG PO TBEC
40.0000 mg | DELAYED_RELEASE_TABLET | Freq: Every day | ORAL | 0 refills | Status: DC
  Filled 2021-07-31: qty 30, 30d supply, fill #0

## 2021-07-31 MED ORDER — CARVEDILOL 25 MG PO TABS
37.5000 mg | ORAL_TABLET | Freq: Two times a day (BID) | ORAL | 0 refills | Status: DC
  Filled 2021-07-31: qty 60, 20d supply, fill #0

## 2021-07-31 MED ORDER — ACETAMINOPHEN 325 MG PO TABS: 650.0000 mg | ORAL_TABLET | ORAL | Status: DC | PRN

## 2021-07-31 MED ORDER — METFORMIN HCL 1000 MG PO TABS
1000.0000 mg | ORAL_TABLET | Freq: Two times a day (BID) | ORAL | 0 refills | Status: DC
  Filled 2021-07-31 (×2): qty 60, 30d supply, fill #0

## 2021-07-31 MED ORDER — ROSUVASTATIN CALCIUM 40 MG PO TABS
40.0000 mg | ORAL_TABLET | Freq: Every day | ORAL | 0 refills | Status: AC
Start: 2021-07-31 — End: ?
  Filled 2021-07-31: qty 30, 30d supply, fill #0

## 2021-07-31 MED ORDER — LOSARTAN POTASSIUM 100 MG PO TABS
100.0000 mg | ORAL_TABLET | Freq: Every day | ORAL | 0 refills | Status: AC
Start: 2021-07-31 — End: ?
  Filled 2021-07-31: qty 30, 30d supply, fill #0

## 2021-07-31 MED ORDER — AMLODIPINE BESYLATE 10 MG PO TABS
ORAL_TABLET | ORAL | 1 refills | Status: AC
Start: 2021-07-31 — End: ?
  Filled 2021-07-31: qty 30, 30d supply, fill #0

## 2021-07-31 MED ORDER — TICAGRELOR 90 MG PO TABS
90.0000 mg | ORAL_TABLET | Freq: Two times a day (BID) | ORAL | 1 refills | Status: DC
  Filled 2021-07-31: qty 60, 30d supply, fill #0

## 2021-07-31 MED ORDER — METFORMIN HCL 500 MG PO TABS
1000.0000 mg | ORAL_TABLET | Freq: Two times a day (BID) | ORAL | Status: DC
  Administered 2021-07-31 – 2021-08-01 (×2): 1000 mg via ORAL
  Filled 2021-07-31 (×2): qty 2

## 2021-07-31 NOTE — Progress Notes (Signed)
Occupational Therapy Session Note  Patient Details  Name: LATORIA DRY MRN: [id number] Date of Birth: [birth year]  Today's Date: 07/31/2021 OT Individual Time: 1017-1100 OT Individual Time Calculation (min): 43 min    Short Term Goals: Week 1:  OT Short Term Goal 1 (Week 1): Patient to demonstrate ModI to MinA  with all simple BADL related task incorporating a  RW for 95% safety during task perforance. OT Short Term Goal 1 - Progress (Week 1): Progressing toward goal  Patient met seated in wc in agreement with OT treatment session. 0/10 pain reported at rest and with activity. Mild soreness in bilateral shoulders from mobility with RW. OT treatment session with focus on community reintegration and activity tolerance/endurance with hospital scavenger hunt. Patient able to recall 3/3 locations after 5 min delay and able to locate all places on hospital map. Use of RW for functional mobility to all 3 locations with 1 seated rest break. Patient reports increased "heaviness" in RLE with fatigue. Patient education on energy conservation upon return home. Education provided on spacing out activities that require increased energy (laundry, grocery shopping, light housekeeping) over the course of the week instead of all in 1 day. Patient expressed verbal understanding. Session concluded with patient seated EOB with call bell within reach and all needs met. Patient made Mod I in room. Sign placed on door.   Therapy Documentation Precautions:  Precautions Precautions: Fall, Other (comment) Precaution Comments: mild right hemiparesis Restrictions Weight Bearing Restrictions: No General:   Therapy/Group: Individual Therapy  Ahlayah Tarkowski R Howerton-Davis 07/31/2021, 7:02 AM

## 2021-07-31 NOTE — Progress Notes (Signed)
Speech Language Pathology Discharge Summary  Patient Details  Name: Shannon Oliver MRN: [id number] Date of Birth: Mar 23, 1961  Today's Date: 07/31/2021 SLP Individual Time: 0990-6893 SLP Individual Time Calculation (min): 42 min  Skilled Therapeutic Interventions:  Skilled ST treatment focused on cognitive goals. SLP facilitated session by providing education on internal and external memory strategies. Pt utilized repetition strategy with modified independence to visually recall up to 80% of details in different picture scenes. Pt utilized categorization strategy to sort items into 3 categories in order to recall up to 12 items in picture format with 90% accuracy. Pt utilized same strategy to sort items into 4 categories to recall up to 16 items in written format with 70% accuracy. SLP also reviewed word finding strategies in which patient used with mod I to describe "[employer]" restaurant. Education complete. Pt to discharge tomorrow. Patient was left in bed with alarm activated and immediate needs within reach at end of session.   Patient has met 3 of 3 long term goals.  Patient to discharge at overall Modified Independent level.  Reasons goals not met: All goals met   Clinical Impression/Discharge Summary: Patient has made excellent gains and has met 3 of 3 long-term goals this admission due to improved short-term memory with use of strategies, and word finding. Patient is currently an overall modified independent for cognitive tasks in regards to working memory and short-term recall with carry over of compensatory memory strategies. Pt also able to utilize beneficial word finding strategies for higher level word finding at the conversational level with modified independence. Patient education is complete and patient to discharge at overall mod I level. Patient's care partner is independent to provide the necessary physical and cognitive assistance at discharge. Patient would benefit from continued  SLP services in outpatient setting to maximize higher level cognitive functions and functional independence.    Care Partner:  Caregiver Able to Provide Assistance: Yes  Type of Caregiver Assistance: Physical;Cognitive  Recommendation:  Outpatient SLP  Rationale for SLP Follow Up: Maximize cognitive function and independence   Equipment: NA   Reasons for discharge: Discharged from hospital;Treatment goals met   Patient/Family Agrees with Progress Made and Goals Achieved: Yes   Patty Sermons 07/31/2021, 12:33 PM

## 2021-07-31 NOTE — Progress Notes (Signed)
Physical Therapy Session Note  Patient Details  Name: Shannon Oliver MRN: [id number] Date of Birth: 1960-10-24  Today's Date: 07/31/2021 PT Individual Time: 6160-7371 PT Individual Time Calculation (min): 54 min   Short Term Goals: Week 1:  PT Short Term Goal 1 (Week 1): Pt will perform bed mobility w/ S. PT Short Term Goal 2 (Week 1): Pt will perform sit to stand w/ minA PT Short Term Goal 3 (Week 1): Pt will perform static standing for 1 min w/ minA PT Short Term Goal 4 (Week 1): Pt will perform 15 ft ambulation w/ LRAD modA Week 2:     Skilled Therapeutic Interventions/Progress Updates:  Patient seated on EOB on entrance to room. Patient alert and agreeable to PT session. Asking when she will be designated as Mod I in the room as the bed alarm continues to alert staff when she stands up. Bed alarm off on entrance and pt informed that she has been made Mod I by lead therapist and next therapist has plaque to go on door but this therapist will alert staff on completion of therapy session.   Patient with no pain complaint throughout session.  Therapeutic Activity: Bed Mobility: Patient performed supine <> sit with IND. No vc req'd. Transfers: Patient performed sit<>stand and stand pivot transfers throughout session with Mod I using BUE to push from seated surface. Education provided to take self assessment and notes of balance, dizziness, and strength prior to mobilizing. If any questions arise re: pt's thoughts on those things, she must use RW to mobilize.   Gait Training:  Patient ambulated throughout session with no AD. Ambulated from room on 5C into elevator and to main gym on 47M. Completed NMR then ambulated to Day Room, then down to Ortho Gym where she is able to ambulate in mulch pit with no AD to simulate ambulation in grassy yard at home. Then able to return to room on [facility]. Completes community distances with supervision. Demonstrated fatigue with slightly lower step height to R by  end of session.   Neuromuscular Re-ed: NMR facilitated during session with focus on standing balance. Pt guided in Functional Gait Assessment.  Scored 24/ 30 indicating medium to low fall risk.    Guided pt in dynamic standing balance challenges of forward lunges, side step squats, grapevine stepping and forward lunges onto Airex pad. Pt with no LOB and able to use ankle strategy to maintain balance throughout. NMR performed for improvements in motor control and coordination, balance, sequencing, judgement, and self confidence/ efficacy in performing all aspects of mobility at highest level of independence.   Patient seated upright  in w/c at end of session with brakes locked, no alarm set, and all needs within reach. Husband arrived in room and seated in recliner.      Therapy Documentation Precautions:  Precautions Precautions: Fall, Other (comment) Precaution Comments: mild right hemiparesis Restrictions Weight Bearing Restrictions: No General:   Vital Signs:  Pain: No pain complaint this day  Therapy/Group: Individual Therapy  Alger Simons PT, DPT 07/31/2021, 10:18 AM

## 2021-07-31 NOTE — Progress Notes (Signed)
Physical Therapy Session Note  Patient Details  Name: Shannon Oliver MRN: [id number] Date of Birth: 1960/10/28  Today's Date: 07/31/2021 PT Individual Time: 9842-1031 PT Individual Time Calculation (min): 25 min   Short Term Goals: Week 1:  PT Short Term Goal 1 (Week 1): Pt will perform bed mobility w/ S. PT Short Term Goal 2 (Week 1): Pt will perform sit to stand w/ minA PT Short Term Goal 3 (Week 1): Pt will perform static standing for 1 min w/ minA PT Short Term Goal 4 (Week 1): Pt will perform 15 ft ambulation w/ LRAD modA Week 2:    Week 3:     Skilled Therapeutic Interventions/Progress Updates:   Pt received supine in bed and agreeable to PT. Supine>sit transfer without assist and orcues. Pt performed ambulatory transfer to toilet without AD or assist. UE supported on bedrail and wall. Pt able to void bladder. Transported in [place] to dayroom. Dynamic standing balance on airex pad while engaged in Wii bowling gross motor task. Able to complete 1 frame on level surface and 9 frames on airex with supervision assist from PT for balance with min cues for ankle strategy to correct anterior LOB. Patient returned to room and left sitting in [facility] with call bell in reach and all needs met.           Therapy Documentation Precautions:  Precautions Precautions: Fall, Other (comment) Precaution Comments: mild right hemiparesis Restrictions Weight Bearing Restrictions: No  Vital Signs: Therapy Vitals Temp: 97.7 F (36.5 C) Temp Source: Oral Pulse Rate: 80 Resp: 16 BP: 124/62 Patient Position (if appropriate): Lying Oxygen Therapy SpO2: 100 % O2 Device: Room Air Pain: Pain Assessment Pain Scale: 0-10 Pain Score: 4  Pain Type: Acute pain Pain Location: Arm Pain Orientation: Mid Pain Descriptors / Indicators: Sore Patients Stated Pain Goal: 1 Pain Intervention(s): Ambulation/increased activity    Therapy/Group: Individual Therapy  Lorie Phenix 07/31/2021, 4:33 PM

## 2021-07-31 NOTE — Progress Notes (Signed)
Patient ID: Shannon Oliver, female   DOB: Dec 01, 1960, 61 y.o.   MRN: [id number]  [place] ordered through [employer]

## 2021-07-31 NOTE — Progress Notes (Signed)
Per patient she does not have any medications from home here at the pharmacy.

## 2021-07-31 NOTE — Progress Notes (Addendum)
Patient ID: Shannon Oliver, female   DOB: 1960-09-10, 61 y.o.   MRN: [id number]  Referral faxed to [facility]. No DME reccs as of 1/27

## 2021-07-31 NOTE — Progress Notes (Signed)
Occupational Therapy Discharge Summary  Patient Details  Name: Shannon Oliver MRN: [id number] Date of Birth: 1960-08-13   Patient has met 12 of 12 long term goals due to improved activity tolerance, improved balance, functional use of  RIGHT upper extremity, and improved coordination.  Patient to discharge at overall Modified Independent level.  Patient's care partner is independent to provide the necessary assistance at discharge.    Reasons goals not met: N/A  Recommendation:  Patient will benefit from ongoing skilled OT services in outpatient setting to continue to advance functional skills in the area of BADL, iADL, and Reduce care partner burden.  Equipment: Rolling walker  Reasons for discharge: treatment goals met and discharge from hospital  Patient/family agrees with progress made and goals achieved: Yes  OT Discharge Precautions/Restrictions  Precautions Precautions: Fall;Other (comment) Precaution Comments: mild right hemiparesis Restrictions Weight Bearing Restrictions: No General   Vital Signs  Pain Pain Assessment Pain Scale: 0-10 Pain Score: 0-No pain ADL ADL Eating: Independent Where Assessed-Eating:  (further assessing would be beneficial.) Grooming: Modified independent Where Assessed-Grooming: Standing at sink Upper Body Bathing: Modified independent Where Assessed-Upper Body Bathing: Shower Lower Body Bathing: Modified independent Where Assessed-Lower Body Bathing: Shower Upper Body Dressing: Modified independent (Device) Where Assessed-Upper Body Dressing: Edge of bed Lower Body Dressing: Modified independent Where Assessed-Lower Body Dressing: Edge of bed Toileting: Modified independent Where Assessed-Toileting: Risk analyst Method: Counselling psychologist: Raised toilet seat Tub/Shower Transfer: Modified independent Clinical cytogeneticist Method: Librarian, academic:  Walk in Retail buyer: Environmental education officer Method: Radiographer, therapeutic: Shower seat without back Vision Baseline Vision/History: 1 Wears glasses Patient Visual Report: No change from baseline Perception  Perception: Within Functional Limits Praxis Praxis: Intact Cognition Overall Cognitive Status: Impaired/Different from baseline Arousal/Alertness: Awake/alert Orientation Level: Oriented X4 Year: 2023 Month: January Day of Week: Correct Attention: Focused;Sustained;Selective Focused Attention: Appears intact Sustained Attention: Appears intact Selective Attention: Appears intact Selective Attention Impairment: Verbal complex Memory: Appears intact Immediate Memory Recall: Sock;Blue;Bed Memory Recall Sock: Without Cue Memory Recall Blue: Without Cue Memory Recall Bed: With Cue Awareness: Appears intact Problem Solving: Appears intact Sensation Sensation Light Touch: Appears Intact Central sensation comments: BUE appears intact Coordination Gross Motor Movements are Fluid and Coordinated: Yes Fine Motor Movements are Fluid and Coordinated: No Motor  Motor Motor: Other (comment) Motor - Skilled Clinical Observations: R shoulder flex, abd AROM < L shoulder AROM, ataxic Motor - Discharge Observations: mild R hemiparesis, which has significantly improved since initial evaluation Mobility  Bed Mobility Bed Mobility: Supine to Sit;Sit to Supine Rolling Right: Independent Rolling Left: Independent Supine to Sit: Independent Sit to Supine: Independent Transfers Sit to Stand: Independent with assistive device Stand to Sit: Independent with assistive device  Trunk/Postural Assessment  Cervical Assessment Cervical Assessment: Within Functional Limits Thoracic Assessment Thoracic Assessment: Within Functional Limits Lumbar Assessment Lumbar Assessment: Within Functional Limits Postural Control Postural Control:  Within Functional Limits  Balance Balance Balance Assessed: Yes Static Sitting Balance Static Sitting - Balance Support: Feet supported Static Sitting - Level of Assistance: 7: Independent Dynamic Sitting Balance Dynamic Sitting - Balance Support: Feet supported Dynamic Sitting - Level of Assistance: 6: Modified independent (Device/Increase time) Static Standing Balance Static Standing - Balance Support: During functional activity;Bilateral upper extremity supported (RW) Static Standing - Level of Assistance: 6: Modified independent (Device/Increase time) Dynamic Standing Balance Dynamic Standing - Balance Support: During functional activity;Bilateral upper extremity supported (RW) Dynamic  Standing - Level of Assistance: 6: Modified independent (Device/Increase time);5: Stand by assistance Extremity/Trunk Assessment RUE Assessment RUE Assessment: Exceptions to [facility] RUE Body System: Neuro Brunstrum levels for arm and hand: Hand;Arm Brunstrum level for arm: Stage IV Movement is deviating from synergy Brunstrum level for hand: Stage V Independence from basic synergies RUE Tone RUE Tone: Mild (Improved from initial eval) LUE Assessment LUE Assessment: Within Functional Limits   Erum Cercone R Howerton-Davis 07/31/2021, 12:15 PM

## 2021-07-31 NOTE — Progress Notes (Signed)
PROGRESS NOTE   Subjective/Complaints: No new complaints this morning She asks about return to work. She works in an [employer] She asks about return to driving  ROS- Bilateral hand and wrist swelling - improved per pt, denies CP, SOB N/V/D   Objective:   No results found. No results for input(s): WBC, HGB, HCT, PLT in the last 72 hours.  Recent Labs    07/31/21 0738  NA 138  K 3.5  CL 107  CO2 23  GLUCOSE 151*  BUN 12  CREATININE 0.93  CALCIUM 9.2     Intake/Output Summary (Last 24 hours) at 07/31/2021 1120 Last data filed at 07/30/2021 1839 Gross per 24 hour  Intake 474 ml  Output --  Net 474 ml        Physical Exam: Vital Signs Blood pressure 140/78, pulse 79, temperature 98.1 F (36.7 C), temperature source Oral, resp. rate 17, height 5\' 6"  (1.676 m), weight 104.5 kg, last menstrual period 08/02/2013, SpO2 (!) 88 %.  Gen: no distress, normal appearing HEENT: oral mucosa pink and moist, NCAT Cardio: Reg rate Chest: normal effort, normal rate of breathing Abd: soft, non-distended Ext: no edema Psych: pleasant, normal affect  Skin: No evidence of breakdown, no evidence of rash Neurologic: Cranial nerves II through XII intact, motor strength is 5/5 in left  deltoid, bicep, tricep, grip, 5/5 left hip flexor, knee extensors, ankle dorsiflexor and plantar flexor 3- RUE, 4+/5 RLE  Sensory exam normal sensation to light touch in bilateral upper and lower extremities Fine motor reduced finger to thumb opposition on RIght side  Musculoskeletal: R>L hand and wrist edema bilateral hand and wrist ecchymosisimproving  Ambulating with therapy   Assessment/Plan: 1. Functional deficits which require 3+ hours per day of interdisciplinary therapy in a comprehensive inpatient rehab setting. Physiatrist is providing close team supervision and 24 hour management of active medical problems listed  below. Physiatrist and rehab team continue to assess barriers to discharge/monitor patient progress toward functional and medical goals  Care Tool:  Bathing  Bathing activity did not occur:  (not observed at the time of assessment, the pt had completed the task prior to arrival.)           Bathing assist Assist Level: Minimal Assistance - Patient > 75% (MinA for BADLs per OT eval)     Upper Body Dressing/Undressing Upper body dressing   What is the patient wearing?: Pull over shirt    Upper body assist Assist Level: Minimal Assistance - Patient > 75%    Lower Body Dressing/Undressing Lower body dressing      What is the patient wearing?: Underwear/pull up     Lower body assist Assist for lower body dressing: Contact Guard/Touching assist     Toileting Toileting    Toileting assist Assist for toileting: Supervision/Verbal cueing     Transfers Chair/bed transfer  Transfers assist     Chair/bed transfer assist level: Supervision/Verbal cueing Chair/bed transfer assistive device: Programmer, multimedia   Ambulation assist   Ambulation activity did not occur: Safety/medical concerns  Assist level: Minimal Assistance - Patient > 75% Assistive device: No Device Max distance: 269ft   Walk  10 feet activity   Assist  Walk 10 feet activity did not occur: Safety/medical concerns        Walk 50 feet activity   Assist Walk 50 feet with 2 turns activity did not occur: Safety/medical concerns         Walk 150 feet activity   Assist Walk 150 feet activity did not occur: Safety/medical concerns         Walk 10 feet on uneven surface  activity   Assist Walk 10 feet on uneven surfaces activity did not occur: Safety/medical concerns         Wheelchair     Assist Is the patient using a wheelchair?: No   Wheelchair activity did not occur: Safety/medical concerns         Wheelchair 50 feet with 2 turns  activity    Assist    Wheelchair 50 feet with 2 turns activity did not occur: Safety/medical concerns       Wheelchair 150 feet activity     Assist  Wheelchair 150 feet activity did not occur: Safety/medical concerns       Blood pressure 140/78, pulse 79, temperature 98.1 F (36.7 C), temperature source Oral, resp. rate 17, height 5\' 6"  (1.676 m), weight 104.5 kg, last menstrual period 08/02/2013, SpO2 (!) 88 %.  Medical Problem List and Plan: 1. 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  2.  Antithrombotics: -DVT/anticoagulation:  Mechanical:  Antiembolism stockings, knee (TED hose) Bilateral lower extremities             -antiplatelet therapy: Aspirin 81 mg daily Hand /wrist swelling s/p bilateral radial art cath  3. Pain Management: Tylenol as needed Mainly weak RUE - has pain and decreased muscular support right wrist will order wrist splint  4. Insomnia: continue Melatonin as needed             -antipsychotic agents: N/A 5. Neuropsych: This patient is capable of making decisions on her own behalf. 6. Skin/Wound Care: Routine skin checks 7. Fluids/Electrolytes/Nutrition: Routine in and outs with follow-up chemistries BMP Latest Ref Rng & Units 07/31/2021 07/24/2021 07/20/2021  Glucose 70 - 99 mg/dL 151(H) 201(H) 210(H)   BUN 6 - 20 mg/dL 12 15 20   Creatinine 0.44 - 1.00 mg/dL 0.93 0.76 1.00  Sodium 135 - 145 mmol/L 138 139 139  Potassium 3.5 - 5.1 mmol/L 3.5 3.9 4.1  Chloride 98 - 111 mmol/L 107 107 103  CO2 22 - 32 mmol/L 23 25 25   Calcium 8.9 - 10.3 mg/dL 9.2 9.0 9.7    8.  Hypertension.   continue Norvasc 10 mg daily,Cozaar 100 mg daily, Coreg 37.5 mg twice daily.  Vitals:  07/30/21 1918 07/31/21 0423  BP: 122/68 140/78  Pulse: 80 79  Resp: 16 17  Temp: 98.4 F (36.9 C) 98.1 F (36.7 C)  SpO2: 97% (!) 88%    9.  Diabetes mellitus.  Hemoglobin A1c 8.4.  SSI.  Patient on Glucophage 1000 mg daily, Toujeo prior to admission.  Resume as needed CBG (last 3)  Recent Labs    07/30/21 1705 07/30/21 2105 07/31/21 0530  GLUCAP 103* 232* 131*  Start insulin glargine 10U qhs. Increase metformin to 1000mg  BID, control improving   10.  Hyperlipidemia.  Continue Crestor 11.  Obesity.  BMI 37.28.  Dietary follow-up 12.  Question medical compliance.  Provide counseling 13.  GERD.  continue Protonix    LOS: 8 days A FACE TO FACE EVALUATION WAS PERFORMED  Martha Clan P Mackensie Pilson 07/31/2021, 11:20 AM

## 2021-07-31 NOTE — Progress Notes (Signed)
Inpatient Rehabilitation Care Coordinator Discharge Note   Patient Details  Name: DWANNA GOSHERT MRN: [id number] Date of Birth: [birth year]   Discharge location: Home  Length of Stay: 10 Days  Discharge activity level: Sup/Cga  Home/community participation: Spouse and children  Patient response LE:XNTZGY Literacy - How often do you need to have someone help you when you read instructions, pamphlets, or other written material from your doctor or pharmacy?: Rarely  Patient response FV:CBSWHQ Isolation - How often do you feel lonely or isolated from those around you?: Never  Services provided included: SW, Pharmacy, TR, CM, RN, SLP, OT, PT, RD, MD  Financial Services:  Financial Services Utilized: [place] offered to/list presented to: patient  Follow-up services arranged:  Outpatient    Outpatient Servicies: Neuro OP      Patient response to transportation need: Is the patient able to respond to transportation needs?: Yes In the past 12 months, has lack of transportation kept you from medical appointments or from getting medications?: No In the past 12 months, has lack of transportation kept you from meetings, work, or from getting things needed for daily living?: No    Comments (or additional information):  Patient/Family verbalized understanding of follow-up arrangements:  Yes  Individual responsible for coordination of the follow-up plan: self  Confirmed correct DME delivered: Dyanne Iha 07/31/2021    Dyanne Iha

## 2021-07-31 NOTE — Plan of Care (Signed)
Problem: RH Expression Communication Goal: LTG Patient will increase word finding of common (SLP) Description: LTG:  Patient will increase word finding of common objects/daily info/abstract thoughts with cues using compensatory strategies (SLP). Outcome: Completed/Met   Problem: RH Memory Goal: LTG Patient will demonstrate ability for day to day (SLP) Description: LTG:   Patient will demonstrate ability for day to day recall/carryover during cognitive/linguistic activities with assist  (SLP) Outcome: Completed/Met Goal: LTG Patient will use memory compensatory aids to (SLP) Description: LTG:  Patient will use memory compensatory aids to recall biographical/new, daily complex information with cues (SLP) Outcome: Completed/Met

## 2021-08-01 DIAGNOSIS — E669 Obesity, unspecified: Secondary | ICD-10-CM

## 2021-08-01 DIAGNOSIS — E1169 Type 2 diabetes mellitus with other specified complication: Secondary | ICD-10-CM

## 2021-08-01 DIAGNOSIS — I10 Essential (primary) hypertension: Secondary | ICD-10-CM

## 2021-08-01 LAB — GLUCOSE, CAPILLARY: Glucose-Capillary: 141 mg/dL — ABNORMAL HIGH (ref 70–99)

## 2021-08-01 NOTE — Progress Notes (Signed)
Alert and oriented x4, compliant with medication administration, no complaints of pain or discomfort. Medications reviewed and discharge instructions provided by [name], PA. Patient assisted by staff off unit and into private car with no issues. Patient discharged successfully.  Shannon Mao, LPN

## 2021-08-01 NOTE — Progress Notes (Signed)
PROGRESS NOTE   Subjective/Complaints: No new issues. Ready to leave when I stopped in this morning.   ROS: Patient denies fever, rash, sore throat, blurred vision, dizziness, nausea, vomiting, diarrhea, cough, shortness of breath or chest pain, joint or back/neck pain, headache, or mood change.    Objective:   No results found. No results for input(s): WBC, HGB, HCT, PLT in the last 72 hours.  Recent Labs    07/31/21 0738  NA 138  K 3.5  CL 107  CO2 23  GLUCOSE 151*  BUN 12  CREATININE 0.93  CALCIUM 9.2     Intake/Output Summary (Last 24 hours) at 08/01/2021 0752 Last data filed at 07/31/2021 1800 Gross per 24 hour  Intake 720 ml  Output 1 ml  Net 719 ml        Physical Exam: Vital Signs Blood pressure (!) 142/83, pulse 76, temperature 98 F (36.7 C), temperature source Oral, resp. rate 18, height 5\' 6"  (1.676 m), weight 104.5 kg, last menstrual period 08/02/2013, SpO2 95 %.  Constitutional: No distress . Vital signs reviewed. HEENT: NCAT, EOMI, oral membranes moist Neck: supple Cardiovascular: RRR without murmur. No JVD    Respiratory/Chest: CTA Bilaterally without wheezes or rales. Normal effort    GI/Abdomen: BS +, non-tender, non-distended Ext: no clubbing, cyanosis, or edema Psych: pleasant and cooperative  Skin: No evidence of breakdown, no evidence of rash Neurologic: Cranial nerves II through XII intact, motor strength is 5/5 in left  deltoid, bicep, tricep, grip, 5/5 left hip flexor, knee extensors, ankle dorsiflexor and plantar flexor 3- RUE, 4+/5 RLE  Sensory exam normal sensation to light touch in bilateral upper and lower extremities Fine motor reduced finger to thumb opposition on RIght side  Musculoskeletal: R>L hand and wrist edema bilateral hand and wrist ecchymosis resolving     Assessment/Plan: 1. Functional deficits which require 3+ hours per day of interdisciplinary therapy in a  comprehensive inpatient rehab setting. Physiatrist is providing close team supervision and 24 hour management of active medical problems listed below. Physiatrist and rehab team continue to assess barriers to discharge/monitor patient progress toward functional and medical goals  Care Tool:  Bathing  Bathing activity did not occur:  (not observed at the time of assessment, the pt had completed the task prior to arrival.) Body parts bathed by patient: Right arm, Left arm, Chest, Abdomen, Front perineal area, Buttocks, Right upper leg, Left upper leg, Left lower leg, Face         Bathing assist Assist Level: Independent with assistive device     Upper Body Dressing/Undressing Upper body dressing   What is the patient wearing?: Pull over shirt    Upper body assist Assist Level: Independent    Lower Body Dressing/Undressing Lower body dressing      What is the patient wearing?: Underwear/pull up, Pants     Lower body assist Assist for lower body dressing: Independent with assitive device     Toileting Toileting    Toileting assist Assist for toileting: Independent with assistive device     Transfers Chair/bed transfer  Transfers assist     Chair/bed transfer assist level: Independent with assistive device Chair/bed transfer  assistive device: Museum/gallery exhibitions officer assist   Ambulation activity did not occur: Safety/medical concerns  Assist level: Supervision/Verbal cueing Assistive device: No Device Max distance: 384ft   Walk 10 feet activity   Assist  Walk 10 feet activity did not occur: Safety/medical concerns  Assist level: Independent with assistive device Assistive device: Walker-rolling   Walk 50 feet activity   Assist Walk 50 feet with 2 turns activity did not occur: Safety/medical concerns  Assist level: Independent with assistive device Assistive device: Walker-rolling    Walk 150 feet activity   Assist Walk 150  feet activity did not occur: Safety/medical concerns  Assist level: Supervision/Verbal cueing Assistive device: No Device    Walk 10 feet on uneven surface  activity   Assist Walk 10 feet on uneven surfaces activity did not occur: Safety/medical concerns   Assist level: Supervision/Verbal cueing Assistive device: Walker-rolling   Wheelchair     Assist Is the patient using a wheelchair?: No   Wheelchair activity did not occur: N/A         Wheelchair 50 feet with 2 turns activity    Assist    Wheelchair 50 feet with 2 turns activity did not occur: N/A       Wheelchair 150 feet activity     Assist  Wheelchair 150 feet activity did not occur: N/A       Blood pressure (!) 142/83, pulse 76, temperature 98 F (36.7 C), temperature source Oral, resp. rate 18, height 5\' 6"  (1.676 m), weight 104.5 kg, last menstrual period 08/02/2013, SpO2 95 %.  Medical Problem List and Plan: 1. 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  Antithrombotics: -DVT/anticoagulation:  Mechanical:  Antiembolism stockings, knee (TED hose) Bilateral lower extremities             -antiplatelet therapy: Aspirin 81 mg daily Hand /wrist swelling s/p bilateral radial art cath  3. Pain Management: Tylenol as needed Mainly weak RUE - has pain and decreased muscular support right wrist will order wrist splint  4. Insomnia: continue Melatonin as needed             -antipsychotic agents: N/A 5. Neuropsych: This patient is capable of making decisions on her own behalf. 6. Skin/Wound Care: Routine skin checks 7. Fluids/Electrolytes/Nutrition: Routine in and outs with follow-up chemistries BMP Latest Ref Rng & Units 07/31/2021  07/24/2021 07/20/2021  Glucose 70 - 99 mg/dL 151(H) 201(H) 210(H)  BUN 6 - 20 mg/dL 12 15 20   Creatinine 0.44 - 1.00 mg/dL 0.93 0.76 1.00  Sodium 135 - 145 mmol/L 138 139 139  Potassium 3.5 - 5.1 mmol/L 3.5 3.9 4.1  Chloride 98 - 111 mmol/L 107 107 103  CO2 22 - 32 mmol/L 23 25 25   Calcium 8.9 - 10.3 mg/dL 9.2 9.0 9.7    8.  Hypertension.   continue Norvasc 10 mg daily,Cozaar 100 mg daily, Coreg 37.5 mg twice daily.  Vitals:   07/31/21 2023 08/01/21 0521  BP: 135/69 (!) 142/83  Pulse: 75 76  Resp: 18   Temp: 97.6 F (36.4 C) 98  F (36.7 C)  SpO2: 100% 95%   Bp controlled  9.  Diabetes mellitus.  Hemoglobin A1c 8.4.  SSI.  Patient on Glucophage 1000 mg daily, Toujeo prior to admission.  Resume as needed CBG (last 3)  Recent Labs    07/31/21 1645 07/31/21 2113 08/01/21 0608  GLUCAP 190* 157* 141*  Start insulin glargine 10U qhs. Increase metformin to 1000mg  BID, control improving. Will need further titration at home   10.  Hyperlipidemia.  Continue Crestor 11.  Obesity.  BMI 37.28.  Dietary follow-up 12.  Question medical compliance.  Provide counseling 13.  GERD.  continue Protonix    LOS: 9 days A FACE TO FACE EVALUATION WAS PERFORMED  Meredith Staggers 08/01/2021, 7:52 AM

## 2021-08-01 NOTE — Plan of Care (Signed)
Problem: RH Balance Goal: LTG Patient will maintain dynamic sitting balance (PT) Description: LTG:  Patient will maintain dynamic sitting balance with assistance during mobility activities (PT) Outcome: Completed/Met Flowsheets (Taken 07/28/2021 1534 by Tawana Scale, PT) LTG: Pt will maintain dynamic sitting balance during mobility activities with:: (upgraded based on pt's progress) Independent with assistive device  Goal: LTG Patient will maintain dynamic standing balance (PT) Description: LTG:  Patient will maintain dynamic standing balance with assistance during mobility activities (PT) Outcome: Completed/Met Flowsheets (Taken 07/28/2021 1534 by Tawana Scale, PT) LTG: Pt will maintain dynamic standing balance during mobility activities with:: (upgraded based on pt's progress) Contact Guard/Touching assist   Problem: Sit to Stand Goal: LTG:  Patient will perform sit to stand with assistance level (PT) Description: LTG:  Patient will perform sit to stand with assistance level (PT) Outcome: Completed/Met Flowsheets (Taken 07/31/2021 1715) LTG: PT will perform sit to stand in preparation for functional mobility with assistance level: Independent with assistive device   Problem: RH Bed Mobility Goal: LTG Patient will perform bed mobility with assist (PT) Description: LTG: Patient will perform bed mobility with assistance, with/without cues (PT). Outcome: Completed/Met Flowsheets (Taken 07/31/2021 1715) LTG: Pt will perform bed mobility with assistance level of: Independent with assistive device    Problem: RH Bed to Chair Transfers Goal: LTG Patient will perform bed/chair transfers w/assist (PT) Description: LTG: Patient will perform bed to chair transfers with assistance (PT). Outcome: Completed/Met Flowsheets (Taken 07/31/2021 1715) LTG: Pt will perform Bed to Chair Transfers with assistance level: Independent with assistive device    Problem: RH Car Transfers Goal: LTG Patient  will perform car transfers with assist (PT) Description: LTG: Patient will perform car transfers with assistance (PT). Outcome: Completed/Met Flowsheets (Taken 07/31/2021 1715) LTG: Pt will perform car transfers with assist:: Supervision/Verbal cueing   Problem: RH Ambulation Goal: LTG Patient will ambulate in controlled environment (PT) Description: LTG: Patient will ambulate in a controlled environment, # of feet with assistance (PT). Outcome: Completed/Met Flowsheets Taken 07/31/2021 1715 by Alger Simons, PT LTG: Ambulation distance in controlled environment: at least 232ft with or without AD Taken 07/28/2021 1534 by Tawana Scale, PT LTG: Pt will ambulate in controlled environ  assist needed:: (upgraded based on pt's progress) Supervision/Verbal cueing   Problem: RH Stairs Goal: LTG Patient will ambulate up and down stairs w/assist (PT) Description: LTG: Patient will ambulate up and down # of stairs with assistance (PT) Outcome: Completed/Met Flowsheets Taken 07/31/2021 1715 by Judieth Keens A, PT LTG: Pt will ambulate up/down stairs assist needed:: Supervision/Verbal cueing Taken 07/28/2021 1534 by Tawana Scale, PT LTG: Pt will  ambulate up and down number of stairs: 4 steps using HRs as needed   Problem: RH Ambulation Goal: LTG Patient will ambulate in home environment (PT) Description: LTG: Patient will ambulate in home environment, # of feet with assistance (PT). Outcome: Completed/Met Flowsheets Taken 07/31/2021 1715 by Alger Simons, PT LTG: Pt will ambulate in home environ  assist needed:: Independent with assistive device Taken 07/28/2021 1536 by Pippin, Bethann Berkshire M, PT LTG: Ambulation distance in home environment: 63ft using LRAD

## 2021-08-03 ENCOUNTER — Telehealth: Payer: Self-pay | Admitting: Registered Nurse

## 2021-08-03 NOTE — Telephone Encounter (Signed)
Transitional Care call  Patient name: Shannon Oliver DOB: May 25, 1961 Are you/is patient experiencing any problems since coming home? No Are there any questions regarding any aspect of care? No Are there any questions regarding medications administration/dosing? No Are meds being taken as prescribed? yes Patient should review meds with caller to confirm Medication List was Reviewed. Have there been any falls? No Has Home Health been to the house and/or have they contacted you? She has scheduled  appointment with  Outpatient Therapy at [facility] on 08/04/2021 If not, have you tried to contact them? NA Can we help you contact them? NA Are bowels and bladder emptying properly? Yes, she states she has Urgency with Bladder Are there any unexpected incontinence issues? No If applicable, is patient following bowel/bladder programs? NA Any fevers, problems with breathing, unexpected pain? No: She states she has a Headache. Are there any skin problems or new areas of breakdown? No Has the patient/family member arranged specialty MD follow up (ie cardiology/neurology/renal/surgical/etc.)?  Ms. Turley states she seen her PCP.  On he discharge summary it states to F/U with [facility] Neurology and Dr Tomi Likens. This provider left message with [facility]. She also has a scheduled appointment with Dr Tomi Likens in March. This provider called Dr Tomi Likens office, representative stated the March appointment was previously scheduled prior to her hospitalization, she asked for this provider to call patient and asked her to call office for a earlier appointment. This provider called Ms. Lucarelli no answer, left message for her to call Dr Tomi Likens office.  She was also instructed to call Dr.de Sindy Messing, Erven Colla to schedule HFU, she verbalizes understanding.  Can we help arrange? See above Does the patient need any other services or support that we can help arrange? No Are caregivers following  through as expected in assisting the patient? Yes Has the patient quit smoking, drinking alcohol, or using drugs as recommended? (                        )  Appointment date/time 08/10/2021  arrival time 10:00 for 10:20 with Bayard Hugger ANP-C. At  [facility]

## 2021-08-04 ENCOUNTER — Ambulatory Visit: Payer: BC Managed Care – PPO | Admitting: Physical Therapy

## 2021-08-06 ENCOUNTER — Telehealth: Payer: Self-pay | Admitting: Registered Nurse

## 2021-08-06 ENCOUNTER — Telehealth: Payer: Self-pay | Admitting: *Deleted

## 2021-08-06 NOTE — Telephone Encounter (Signed)
Return Shannon Oliver call,she states when her leave of absence paperwork was filled out, it stated she could return to work on 08/06/2021.  She called to inquire when she could return to work. She is scheduled for TC appointment on 08/10/2021.  This will be discussed with Dr Letta Pate, she verbalizes understanding.

## 2021-08-06 NOTE — Telephone Encounter (Signed)
Shannon Oliver called and is requesting a call back from [place] .

## 2021-08-06 NOTE — Telephone Encounter (Signed)
I spoke with Dr Letta Pate regarding [name] request to return to work. She will need clearance by Dr Letta Pate or Dr Loretta Plume. Call placed to [name] regarding the above, she verbalizes understanding.

## 2021-08-10 ENCOUNTER — Other Ambulatory Visit: Payer: Self-pay

## 2021-08-10 ENCOUNTER — Other Ambulatory Visit (HOSPITAL_COMMUNITY): Payer: Self-pay | Admitting: Neuroradiology

## 2021-08-10 ENCOUNTER — Encounter: Payer: Self-pay | Admitting: Registered Nurse

## 2021-08-10 ENCOUNTER — Encounter: Payer: BC Managed Care – PPO | Attending: Registered Nurse | Admitting: Registered Nurse

## 2021-08-10 VITALS — BP 112/74 | HR 78 | Ht 66.0 in | Wt 238.8 lb

## 2021-08-10 DIAGNOSIS — I671 Cerebral aneurysm, nonruptured: Secondary | ICD-10-CM | POA: Insufficient documentation

## 2021-08-10 DIAGNOSIS — I10 Essential (primary) hypertension: Secondary | ICD-10-CM | POA: Diagnosis not present

## 2021-08-10 DIAGNOSIS — I6330 Cerebral infarction due to thrombosis of unspecified cerebral artery: Secondary | ICD-10-CM | POA: Diagnosis not present

## 2021-08-10 NOTE — Progress Notes (Signed)
Subjective:    Patient ID: Shannon Oliver, female    DOB: 12/26/1960, 61 y.o.   MRN: [id number]  HPI: Shannon Oliver is a 61 y.o. 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  She was admitted for ICA aneurysm embolization.on 07/20/2021 . She was seen in [facility] ED: For Headache on 06/09/2021: See Note for details.  Neurology  Consulted: on 07/20/2021 H&P: Dr. Edgardo Roys Oliver is a 61 y.o. female with PMH significant for DM2, HTN, HLD, obesity, OSA who underwent R radial approach elective cerebral angio for left ICA aneurysm embolization. Post procedure reported decreased sensation on the left compared to the right and was later noted to be weak in LUE and LLE. CTH negative for ICH. CTA with no LVO. tNKASE was discussed and she opted for tNKASE. Of note, she was given heparin injection for the angiogram and received 5000 units around 9AM and an addition 1000 units at 10AM.  She was more than 2 hours past the last time she was given heparin and given that the dose was small, she was offered tNKASE.  CT Head: WO Contrast IMPRESSION: 1. No acute intracranial process.  CT Angio:  IMPRESSION: 1. 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 2.  No intracranial large vessel occlusion or significant stenosis. 3.  No hemodynamically significant stenosis in the neck. 4. Unchanged 3 mm aneurysm projecting from the distal cavernous right ICA.  MR Brain: WO Contrast.  IMPRESSION: 1. 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 Among the most conspicuous is a 6 mm lacune in the left external capsule abutting the lentiform. No associated hemorrhage or mass effect.   2. Underlying moderate for age signal changes in the brain compatible with chronic small vessel  disease.  [name] underwent :  coiling of a left ICA aneurysm  on 07/20/2021.   [name] was admitted to inpatient rehabilitation on 07/23/2021 and discharged home on 08/01/2021.She is scheduled for outpatient therapy with Neuro-Rehabilitation on 08/12/2021. [name] states she was going to [employer], she was asked to put gym on hold until she is evaluated with Physical therapy on 08/12/2021, she verbalizes understanding.  She states she has pain in her right shoulder and right hip. She rates her pain 4. Her current exercise regimen is walking and performing stretching exercises.       Pain Inventory Average Pain 5 Pain Right Now 4 My pain is constant, dull, and aching  LOCATION OF PAIN  head, right shoulder and hip  BOWEL Number of stools per week: 7 Oral laxative use No  Type of laxative na Enema or suppository use No  History of colostomy No  Incontinent No   BLADDER Pads In and out cath, frequency . Able to self cath  na Bladder incontinence No  Frequent urination Yes  Leakage with coughing Yes  Difficulty starting stream No  Incomplete bladder emptying No    Mobility use a cane ability to climb steps?  no do you drive?  no  Function employed # of hrs/week . what is your job? [employer]  Neuro/Psych bladder control problems weakness numbness trouble walking  Prior Studies TC appt  Physicians involved in your care TC appt   Family History  Problem Relation Age of Onset   Cancer Mother  Hypertension Mother    Diabetes Mother    Breast cancer Maternal Aunt    Breast cancer Cousin    Social History   Socioeconomic History   Marital status: Married    Spouse name: Not on file   Number of children: Not on file   Years of education: Not on file   Highest education level: Not on file  Occupational History   Not on file  Tobacco Use   Smoking status: Never   Smokeless tobacco: Never  Vaping Use   Vaping Use: Never used   Substance and Sexual Activity   Alcohol use: Not Currently    Comment: occ   Drug use: No   Sexual activity: Not on file  Other Topics Concern   Not on file  Social History Narrative   ** Merged History Encounter **       Social Determinants of Health   Financial Resource Strain: Not on file  Food Insecurity: Not on file  Transportation Needs: Not on file  Physical Activity: Not on file  Stress: Not on file  Social Connections: Not on file   Past Surgical History:  Procedure Laterality Date   CARDIAC CATHETERIZATION     CATARACT EXTRACTION Left 06/04/2021   COLONOSCOPY WITH PROPOFOL N/A 04/29/2015   Procedure: COLONOSCOPY WITH PROPOFOL;  Surgeon: Shannon Fair, MD;  Location: [facility];  Service: Endoscopy;  Laterality: N/A;   EYE SURGERY     HYSTEROSCOPY WITH D & C N/A 09/07/2017   Procedure: DILATATION AND CURETTAGE /HYSTEROSCOPY;  Surgeon: Shannon Leiter, MD;  Location: [facility];  Service: Gynecology;  Laterality: N/A;   IR ANGIO INTRA EXTRACRAN SEL COM CAROTID INNOMINATE UNI R MOD SED  06/18/2021   IR ANGIO INTRA EXTRACRAN SEL INTERNAL CAROTID UNI L MOD SED  06/18/2021   IR ANGIO INTRA EXTRACRAN SEL INTERNAL CAROTID UNI R MOD SED  07/20/2021   IR ANGIO VERTEBRAL SEL VERTEBRAL UNI R MOD SED  06/18/2021   IR ANGIOGRAM FOLLOW UP STUDY  07/20/2021   IR CT HEAD LTD  07/20/2021   IR RADIOLOGIST EVAL & MGMT  06/19/2021   IR TRANSCATH/EMBOLIZ  07/20/2021   IR US GUIDE VASC ACCESS RIGHT  06/18/2021   KNEE ARTHROSCOPY W/ MENISCAL REPAIR Right    RADIOLOGY WITH ANESTHESIA N/A 07/20/2021   Procedure: IR WITH ANESTHESIA;  Surgeon: Shannon Earls, MD;  Location: [place];  Service: Radiology;  Laterality: N/A;   Past Medical History:  Diagnosis Date   Abnormal EKG    LVH with strain   Acid reflux    Asthma    Depression    Diabetes mellitus    A1C over 9   HLD (hyperlipidemia)    Hypertension    Noncompliance    Obesity    Pneumonia     Sleep apnea    BP 112/74    Pulse 78    Ht 5\' 6"  (1.676 m)    Wt 238 lb 12.8 oz (108.3 kg)    LMP 08/02/2013 Comment: spotting   SpO2 98%    BMI 38.54 kg/m   Opioid Risk Score:   Fall Risk Score:  `1  Depression screen PHQ 2/9  Depression screen [facility] 2/9 08/10/2021 09/04/2018 07/25/2018 07/18/2017  Decreased Interest 0 1 1 2   Down, Depressed, Hopeless 2 1 1 2   PHQ - 2 Score 2 2 2 4   Altered sleeping 0 3 - 3  Tired, decreased energy 0 3 - 2  Change in appetite 0 2 - 3  Feeling bad or failure about yourself  0 1 - 3  Trouble concentrating 0 2 - -  Moving slowly or fidgety/restless 0 0 - 0  Suicidal thoughts 0 0 0 1  PHQ-9 Score 2 13 - 16  Difficult doing work/chores Not difficult at all - - -      Review of Systems  HENT:  Positive for congestion.   Musculoskeletal:        Right hip and shoulder pain Head pain  Neurological:  Positive for weakness and numbness.  All other systems reviewed and are negative.     Objective:   Physical Exam Vitals and nursing note reviewed.  Constitutional:      Appearance: Normal appearance.  Cardiovascular:     Rate and Rhythm: Normal rate and regular rhythm.     Pulses: Normal pulses.     Heart sounds: Normal heart sounds.  Pulmonary:     Effort: Pulmonary effort is normal.     Breath sounds: Normal breath sounds.  Musculoskeletal:     Cervical back: Normal range of motion and neck supple.     Comments: Normal Muscle Bulk and Muscle Testing Reveals:  Upper Extremities: Full ROM and Muscle Strength  on Right 4/5 and Left 5/5 Thoracic Paraspinal Tenderness: T-7- T-9  Right Greater Trochanter Tenderness Lower Extremities: Full ROM and Muscle Strength 5/5 Arises from Table Slowly using cane for support Narrow Based  Gait     Skin:    General: Skin is warm and dry.  Neurological:     Mental Status: She is alert and oriented to person, place, and time.  Psychiatric:        Mood and Affect: Mood normal.        Behavior: Behavior normal.          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 She has a scheduled appointment with [facility], she has a F/U appointment with de Rosario Jacks on 08/11/2021  Essential Hypertension: Continue current medication regimen. PCP following. Continue to Monitor.   F/U with Dr Letta Pate  .

## 2021-08-11 ENCOUNTER — Telehealth (HOSPITAL_COMMUNITY): Payer: Self-pay

## 2021-08-11 ENCOUNTER — Ambulatory Visit (HOSPITAL_COMMUNITY)
Admission: RE | Admit: 2021-08-11 | Discharge: 2021-08-11 | Disposition: A | Discharge status: Home / Self Care | Payer: BC Managed Care – PPO | Source: Ambulatory Visit | Attending: Neuroradiology | Admitting: Neuroradiology

## 2021-08-11 DIAGNOSIS — I671 Cerebral aneurysm, nonruptured: Secondary | ICD-10-CM

## 2021-08-11 NOTE — Progress Notes (Addendum)
Chief Complaint: The patient is seen in follow up today s/p left ICA terminus aneurysm embolization  History of present illness:  Shannon Oliver is a 61 y.o. female with a past medical history of diabetes, hyperlipidemia, hypertension and cataract with 2 intracranial aneurysms found on CT angiogram performed on 06/05/2021 for headache workup in the emergency room with negative lumbar puncture.   She underwent a diagnostic cerebral angiogram on 06/19/2021 that confirmed the presence of 2 brain aneurysms. One of the aneurysms is at the left carotid terminus, measuring 3 mm with a small pseudolobulation. The second aneurysm is at the ophthalmic segment of the right ICA and measures 2.75 mm.  On 07/20/2021, she underwent endovascular embolization of her left ICA terminus aneurysm with a web device.  At PACU, she initially felt a mild left-sided paresthesia.  However, after few minutes, she developed left-sided weakness.  Code stroke was activated into was taken to a CT/CTA of the head and neck which were negative for large vessel occlusion.  Her NIHSS was 8.  She received TNK with significant improvement of her last sided weakness.  Unfortunately, she later developed right hand swelling related to radial artery puncture site rebleed.  This was controlled with manual pressure and no further treatments were required aside from physical therapy.  She was admitted by inpatient rehab.  In the meantime, she head resolution of the left-sided weakness but developed right-sided weakness.  MRI of the brain performed on 07/27/2021 showed new left-sided punctate infarcts.    She currently complains of mild right-sided weakness requiring her to use a cane for ambulation.  She is doing physical therapy as well as strength training at the gym.  Past Medical History:  Diagnosis Date   Abnormal EKG    LVH with strain   Acid reflux    Asthma    Depression    Diabetes mellitus    A1C over 9   HLD  (hyperlipidemia)    Hypertension    Noncompliance    Obesity    Pneumonia    Sleep apnea     Past Surgical History:  Procedure Laterality Date   CARDIAC CATHETERIZATION     CATARACT EXTRACTION Left 06/04/2021   COLONOSCOPY WITH PROPOFOL N/A 04/29/2015   Procedure: COLONOSCOPY WITH PROPOFOL;  Surgeon: Garlan Fair, MD;  Location: [facility];  Service: Endoscopy;  Laterality: N/A;   EYE SURGERY     HYSTEROSCOPY WITH D & C N/A 09/07/2017   Procedure: DILATATION AND CURETTAGE /HYSTEROSCOPY;  Surgeon: Sloan Leiter, MD;  Location: [place];  Service: Gynecology;  Laterality: N/A;   IR ANGIO INTRA EXTRACRAN SEL COM CAROTID INNOMINATE UNI R MOD SED  06/18/2021   IR ANGIO INTRA EXTRACRAN SEL INTERNAL CAROTID UNI L MOD SED  06/18/2021   IR ANGIO INTRA EXTRACRAN SEL INTERNAL CAROTID UNI R MOD SED  07/20/2021   IR ANGIO VERTEBRAL SEL VERTEBRAL UNI R MOD SED  06/18/2021   IR ANGIOGRAM FOLLOW UP STUDY  07/20/2021   IR CT HEAD LTD  07/20/2021   IR RADIOLOGIST EVAL & MGMT  06/19/2021   IR TRANSCATH/EMBOLIZ  07/20/2021   IR US GUIDE VASC ACCESS RIGHT  06/18/2021   KNEE ARTHROSCOPY W/ MENISCAL REPAIR Right    RADIOLOGY WITH ANESTHESIA N/A 07/20/2021   Procedure: IR WITH ANESTHESIA;  Surgeon: Pedro Earls, MD;  Location: [place];  Service: Radiology;  Laterality: N/A;    Allergies: Canagliflozin and Hydrocodone  Medications: Prior to Admission  medications   Medication Sig Start Date End Date Taking? Authorizing Provider  acetaminophen (TYLENOL) 325 MG tablet Take 2 tablets (650 mg total) by mouth every 4 (four) hours as needed for mild pain (or temp > 37.5 C (99.5 F)). 07/31/21   Angiulli, Lavon Paganini, PA-C  amLODipine (NORVASC) 10 MG tablet Take 1 tablet by mouth once daily. 07/31/21   Angiulli, Lavon Paganini, PA-C  aspirin EC 81 MG EC tablet Take 1 tablet (81 mg total) by mouth daily. Swallow whole. 07/24/21   Janine Ores, NP  carvedilol (COREG) 25 MG tablet Take 1.5  tablets (37.5 mg total) by mouth 2 (two) times daily with a meal. 07/31/21 11/02/21  Angiulli, Lavon Paganini, PA-C  FARXIGA 5 MG TABS tablet TAKE 1 TABLET (5 MG TOTAL) BY MOUTH DAILY. 06/15/21   Elayne Snare, MD  losartan (COZAAR) 100 MG tablet Take 1 tablet (100 mg total) by mouth daily. 07/31/21   Angiulli, Lavon Paganini, PA-C  MELATONIN PO Take 1 capsule by mouth at bedtime as needed (sleep).    [provider]  metFORMIN (GLUCOPHAGE) 1000 MG tablet Take 1 tablet (1,000 mg total) by mouth 2 (two) times daily with a meal. 07/31/21   Angiulli, Lavon Paganini, PA-C  Multiple Vitamins-Calcium (ONE-A-DAY WOMENS PO) Take 1 tablet by mouth daily.    [provider]  pantoprazole (PROTONIX) 40 MG tablet Take 1 tablet (40 mg total) by mouth at bedtime. 07/31/21   Angiulli, Lavon Paganini, PA-C  prednisoLONE acetate (PRED FORTE) 1 % ophthalmic suspension Place 1 drop into the left eye daily. 06/13/21   [provider]  rosuvastatin (CRESTOR) 40 MG tablet Take 1 tablet (40 mg total) by mouth daily. 07/31/21   Angiulli, Lavon Paganini, PA-C  Sodium Chloride, Hypertonic, (MURO 956 OP) Place 1 application into the left eye at bedtime.    [provider]  ticagrelor (BRILINTA) 90 MG TABS tablet Take 1 tablet (90 mg total) by mouth 2 (two) times daily. 07/31/21   Angiulli, Lavon Paganini, PA-C     Family History  Problem Relation Age of Onset   Cancer Mother    Hypertension Mother    Diabetes Mother    Breast cancer Maternal Aunt    Breast cancer Cousin     Social History   Socioeconomic History   Marital status: Married    Spouse name: Not on file   Number of children: Not on file   Years of education: Not on file   Highest education level: Not on file  Occupational History   Not on file  Tobacco Use   Smoking status: Never   Smokeless tobacco: Never  Vaping Use   Vaping Use: Never used  Substance and Sexual Activity   Alcohol use: Not Currently    Comment: occ   Drug use: No   Sexual activity:  Not on file  Other Topics Concern   Not on file  Social History Narrative   ** Merged History Encounter **       Social Determinants of Health   Financial Resource Strain: Not on file  Food Insecurity: Not on file  Transportation Needs: Not on file  Physical Activity: Not on file  Stress: Not on file  Social Connections: Not on file     Vital Signs: LMP 08/02/2013 Comment: spotting  Physical Exam Constitutional:      General: She is not in acute distress. HENT:     Head: Normocephalic.  Eyes:     Extraocular Movements: Extraocular  movements intact.     Conjunctiva/sclera: Conjunctivae normal.     Pupils: Pupils are equal, round, and reactive to light.  Musculoskeletal:     Right hand: Swelling present.     Left hand: Normal.     Comments: Subtle residual right hand swelling.  Neurological:     Mental Status: She is alert and oriented to person, place, and time.     Cranial Nerves: Cranial nerves 2-12 are intact.     Sensory: Sensation is intact.     Motor: Weakness present.     Comments: Very mild right hand and right lower extremity weakness.    Imaging: No results found.  Labs:  CBC: Recent Labs    06/08/21 0829 06/18/21 0950 07/20/21 0642 07/24/21 0536  WBC 5.5 5.2 6.0 5.8  HGB 12.7 12.4 11.9* 11.1*  HCT 40.7 38.8 36.5 33.4*  PLT 231 251 252 216    COAGS: Recent Labs    06/18/21 0950 07/20/21 0642 07/20/21 1234  INR 1.1 0.9 1.1  APTT  --   --  152*    BMP: Recent Labs    06/18/21 0950 07/07/21 1346 07/20/21 0642 07/24/21 0536 07/31/21 0738  NA 139 140 139 139 138  K 3.9 4.0 4.1 3.9 3.5  CL 106 106 103 107 107  CO2 27 25 25 25 23   GLUCOSE 136* 211* 210* 201* 151*  BUN 14 22 20 15 12   CALCIUM 9.6 9.5 9.7 9.0 9.2  CREATININE 0.88 0.83 1.00 0.76 0.93  GFRNONAA >60  --  >60 >60 >60    LIVER FUNCTION TESTS: Recent Labs    09/05/20 1522 06/08/21 0829 07/07/21 1346 07/24/21 0536  BILITOT 0.4 0.4 0.4 0.3  AST 21 19 19 18   ALT  20 19 23 19   ALKPHOS 80 49 39 40  PROT 7.6 7.6 7.6 6.8  ALBUMIN 4.0 3.9 4.0 3.5    Assessment:  Dreonna E Messing has made a significant recovery with mild residual right-sided weakness. I believe that her periprocedural stroke may have been caused by postoperative platelet aggregation with clot formation at the web device/vessel interface despite dual anti-platelet therapy and intra procedural heparin.  Although the procedure was in the left ICA with initial symptoms of left-sided weakness, she has a dominant left A1/ACA supplying both ACAs and, therefore, allowing for a right-sided embolus from a left-sided procedure.   She continued to present new symptoms with new punctate infarcts seen on MRI performed on 07/27/2021.  Given the wathershed distribution on the left hemisphere punctate infarcts, I would like to perform a diagnostic cerebral angiogram to exclude the possibility of a delayed migration of the web device resulting in stenosis of the parent artery.  Although she did have a CT angiogram performed at that time, evaluation of the parent artery is limited due to streak artifact from the web device.   I explained to her that the angiogram is the best study to evaluate the device but that we could perform an MR angiogram as a noninvasive alternative.  She would like to proceed with this cerebral angiogram to have a definitive answer.  Our schedulers will reach out to her to book the angiogram under moderate sedation.    Signed: Pedro Earls, MD 08/11/2021, 1:02 PM    I spent a total of    25 Minutes in face to face in clinical consultation, greater than 50% of which was counseling/coordinating care for cerebral aneurysm s/p coiling.

## 2021-08-11 NOTE — Telephone Encounter (Signed)
Called to schedule diagnostic angiogram with Dr. Debbrah Alar, no answer, will try and call back to schedule. AW

## 2021-08-12 ENCOUNTER — Ambulatory Visit: Payer: BC Managed Care – PPO | Attending: Internal Medicine

## 2021-08-12 ENCOUNTER — Other Ambulatory Visit: Payer: Self-pay

## 2021-08-12 DIAGNOSIS — R2689 Other abnormalities of gait and mobility: Secondary | ICD-10-CM | POA: Diagnosis present

## 2021-08-12 DIAGNOSIS — R2681 Unsteadiness on feet: Secondary | ICD-10-CM | POA: Diagnosis not present

## 2021-08-12 DIAGNOSIS — M6281 Muscle weakness (generalized): Secondary | ICD-10-CM | POA: Insufficient documentation

## 2021-08-12 NOTE — Therapy (Signed)
[facility] [address] [place], [place], [address] Phone: [phone]   Fax:  [phone]  Physical Therapy Evaluation  Patient Details  Name: Shannon Oliver MRN: [id number] Date of Birth: 23-Jun-1961 Referring Provider (PT): Referred by: Cathlyn Parsons, PA-C. Followed by Dr. Joan Mayans,   Encounter Date: 08/12/2021   PT End of Session - 08/12/21 1520     Visit Number 1    Number of Visits 5    Date for PT Re-Evaluation 09/18/21    Authorization Type BCBS    PT Start Time 1445    PT Stop Time 1520    PT Time Calculation (min) 35 min    Activity Tolerance Patient tolerated treatment well    Behavior During Therapy WFL for tasks assessed/performed             Past Medical History:  Diagnosis Date   Abnormal EKG    LVH with strain   Acid reflux    Asthma    Depression    Diabetes mellitus    A1C over 9   HLD (hyperlipidemia)    Hypertension    Noncompliance    Obesity    Pneumonia    Sleep apnea     Past Surgical History:  Procedure Laterality Date   CARDIAC CATHETERIZATION     CATARACT EXTRACTION Left 06/04/2021   COLONOSCOPY WITH PROPOFOL N/A 04/29/2015   Procedure: COLONOSCOPY WITH PROPOFOL;  Surgeon: Garlan Fair, MD;  Location: [facility];  Service: Endoscopy;  Laterality: N/A;   EYE SURGERY     HYSTEROSCOPY WITH D & C N/A 09/07/2017   Procedure: DILATATION AND CURETTAGE /HYSTEROSCOPY;  Surgeon: Sloan Leiter, MD;  Location: [facility];  Service: Gynecology;  Laterality: N/A;   IR ANGIO INTRA EXTRACRAN SEL COM CAROTID INNOMINATE UNI R MOD SED  06/18/2021   IR ANGIO INTRA EXTRACRAN SEL INTERNAL CAROTID UNI L MOD SED  06/18/2021   IR ANGIO INTRA EXTRACRAN SEL INTERNAL CAROTID UNI R MOD SED  07/20/2021   IR ANGIO VERTEBRAL SEL VERTEBRAL UNI R MOD SED  06/18/2021   IR ANGIOGRAM FOLLOW UP STUDY  07/20/2021   IR CT HEAD LTD  07/20/2021   IR RADIOLOGIST EVAL & MGMT   06/19/2021   IR TRANSCATH/EMBOLIZ  07/20/2021   IR US GUIDE VASC ACCESS RIGHT  06/18/2021   KNEE ARTHROSCOPY W/ MENISCAL REPAIR Right    RADIOLOGY WITH ANESTHESIA N/A 07/20/2021   Procedure: IR WITH ANESTHESIA;  Surgeon: Pedro Earls, MD;  Location: [facility];  Service: Radiology;  Laterality: N/A;    There were no vitals filed for this visit.    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 Post procedure, pt wit left side decreased sensation and left side weakness. Code Stroke activated. Recieved TNK with improvement of weakness, and admitted to inpatient rehab. Had resolution of left sided weakness but developed right sided weakness. MRI performed on 1/23 showed new left side punctate infarct. Patient is not currently ambulating with a device into session, typically uses a SPC at times for longer distance. Reports is back to going to the gym 5-6x/week. Denies falls. Patient reports intermittent pain in the arm. Has noticed that her balance can be off.    Pertinent History PMH significant for DM2, HTN, HLD, obesity, OSA, cataracts    Limitations Walking;Standing    How long can you  walk comfortably? 20 minutes    Diagnostic tests MRI showing scattered small and punctate infarcts in bilateral cerbral hemispheres. Underlying moderate for age signal changes in the brain  compatible with chronic small vessel disease.    Patient Stated Goals None    Currently in Pain? No/denies                [facility] PT Assessment - 08/12/21 0001       Assessment   Medical Diagnosis Aneurysm    Referring Provider (PT) Referred by: Cathlyn Parsons, PA-C. Followed by Dr. Joan Mayans,    Onset Date/Surgical Date 07/20/21    Hand Dominance Right    Prior Therapy None      Precautions   Precautions Other (comment)    Precaution Comments DM2, HTN, HLD, obesity, OSA      Restrictions   Weight  Bearing Restrictions No      Balance Screen   Has the patient fallen in the past 6 months No    Has the patient had a decrease in activity level because of a fear of falling?  No    Is the patient reluctant to leave their home because of a fear of falling?  No      Home Ecologist residence    Living Arrangements Spouse/significant other    Available Help at Discharge Family    Type of [place] to enter;Ramped entrance    Entrance Stairs-Number of Steps 5   also has a ramp she uses   Additional Comments denies difficulty getting around home      Prior Function   Level of Independence Independent    Vocation Full time employment    [employer] approx 32 hours a week      Cognition   Overall Cognitive Status Within Functional Limits for tasks assessed   reports some difficulty with word finding, denies wanting speech evaluation at this time     Observation/Other Assessments   Focus on Therapeutic Outcomes (FOTO)  Staff did not capture      Sensation   Light Touch Appears Intact      Coordination   Gross Motor Movements are Fluid and Coordinated Yes    Coordination and Movement Description GM movements are WFL with no specific incoordination      ROM / Strength   AROM / PROM / Strength Strength      Strength   Overall Strength Deficits    Strength Assessment Site Hip;Knee;Ankle    Right Hip Flexion 3+/5    Left Hip Flexion 4/5    Right Knee Flexion 4/5    Right Knee Extension 4/5    Left Knee Flexion 4+/5    Left Knee Extension 4+/5    Right Ankle Dorsiflexion 4-/5    Left Ankle Dorsiflexion 4/5      Bed Mobility   Bed Mobility --   denies difficulty with bed mobility     Transfers   Transfers Sit to Stand;Stand to Sit    Sit to Stand 6: Modified independent (Device/Increase time)    Five time sit to stand comments  14.69 seconds without UE support from standard chair; mild fatigue    Stand to  Sit 6: Modified independent (Device/Increase time)      Ambulation/Gait   Ambulation/Gait Yes    Ambulation/Gait Assistance 6: Modified independent (Device/Increase time)    Ambulation Distance (Feet) 75 Feet    Assistive  device None    Gait Pattern Step-through pattern;Trendelenburg    Ambulation Surface Level;Indoor    Gait velocity 10.13 secs = 3.23 ft/sec    Stairs Yes    Stairs Assistance 5: Supervision    Stairs Assistance Details (indicate cue type and reason) with ascend able to complete without reciprocal pattern no rails, mild unsteadiness with descent requiring use of rail    Stair Management Technique Alternating pattern;Forwards;One rail Right;No rails    Number of Stairs 4    Height of Stairs 6    Gait Comments trendelenburg gait noted due to R Hip Weakness      Functional Gait  Assessment   Gait assessed  Yes    Gait Level Surface Walks 20 ft in less than 5.5 sec, no assistive devices, good speed, no evidence for imbalance, normal gait pattern, deviates no more than 6 in outside of the 12 in walkway width.    Change in Gait Speed Able to smoothly change walking speed without loss of balance or gait deviation. Deviate no more than 6 in outside of the 12 in walkway width.    Gait with Horizontal Head Turns Performs head turns smoothly with no change in gait. Deviates no more than 6 in outside 12 in walkway width    Gait with Vertical Head Turns Performs head turns with no change in gait. Deviates no more than 6 in outside 12 in walkway width.    Gait and Pivot Turn Pivot turns safely within 3 sec and stops quickly with no loss of balance.    Step Over Obstacle Is able to step over one shoe box (4.5 in total height) without changing gait speed. No evidence of imbalance.    Gait with Narrow Base of Support Ambulates 4-7 steps.    Gait with Eyes Closed Walks 20 ft, uses assistive device, slower speed, mild gait deviations, deviates 6-10 in outside 12 in walkway width. Ambulates  20 ft in less than 9 sec but greater than 7 sec.    Ambulating Backwards Walks 20 ft, no assistive devices, good speed, no evidence for imbalance, normal gait    Steps Alternating feet, must use rail.    Total Score 25                        Objective measurements completed on examination: See above findings.                PT Education - 08/12/21 1515     Education Details Pt educated on POC/Eval Findings    Person(s) Educated Patient    Methods Explanation    Comprehension Verbalized understanding              PT Short Term Goals - 08/12/21 1529       PT SHORT TERM GOAL #1   Title = LTGs               PT Long Term Goals - 08/12/21 1530       PT LONG TERM GOAL #1   Title Pt will be independent with final and progressive HEP for strength/balance (ALL LTGs Due: 09/18/21)    Baseline no HEP established    Time 4    Period Weeks    Status New    Target Date 09/18/21      PT LONG TERM GOAL #2   Title Pt will improve 5x sit <> stand without UE support to </= 12 seconds  Baseline 14.69 secs    Time 4    Period Weeks    Status New      PT LONG TERM GOAL #3   Title Pt will improve FGA to >/= 27/30 to demo reduced fall risk and improved balance    Baseline 25/30    Time 4    Period Weeks    Status New      PT LONG TERM GOAL #4   Title Pt will be able to ascend/descend 5 stairs without use of rails, reciprocal pattern and no LOB noted    Baseline rails needed for descent; imbalance    Time 4    Period Weeks    Status New                    Plan - 08/12/21 1526     Clinical Impression Statement Patient is a 61 y.o. female referred to Neuro OPPT services for Aneurysm. Patient's PMH significant for the following: DM2, HTN, HLD, obesity, OSA who underwent R radial approach elective cerebral angio for left ICA aneurysm embolization. Patient presents with the following impairments upon evaluation: decreased strength  specifically noted in RLE > LLE, impaired balance, abnormal gait and low fall risk. Patient is currently ambulating at 3.23 ft/sec without use of AD. Patient demo trendelenburg gait due to R Hip Weakness. Low fall risk with score of 25/30 on FGA. Patient will benefit from skilled PT services to address impairments and maximize functional mobility.    Personal Factors and Comorbidities Comorbidity 3+    Comorbidities DM2, HTN, HLD, obesity, OSA    Examination-Activity Limitations Stairs;Stand;Locomotion Level    Examination-Participation Restrictions Occupation;Community Activity    Stability/Clinical Decision Making Stable/Uncomplicated    Clinical Decision Making Low    Rehab Potential Excellent    PT Frequency 1x / week    PT Duration 4 weeks    PT Treatment/Interventions ADLs/Self Care Home Management;Moist Heat;Cryotherapy;DME Instruction;Therapeutic activities;Functional mobility training;Stair training;Gait training;Therapeutic exercise;Balance training;Neuromuscular re-education;Patient/family education;Manual techniques;Passive range of motion;Dry needling;Vestibular    PT Next Visit Plan Initiate HEP for Balance/Strength. Patietn is already going to gym 5x/week make exercises functional that she can incorporate into gym. 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 07/23/2021    Brain aneurysm 07/20/2021   bilateral scattered small and punctate infarcts likely due to embolism s/p procedure of endovascular  coiling of left terminal ICA aneurysm 07/20/2021   Hyperlipidemia 04/30/2021   BMI 39.0-39.9,adult 04/17/2021   Elevated troponin 10/30/2020   AKI (acute kidney injury) ([facility]) 10/30/2020   Palpitations    Chest pain 10/29/2020   Encounter for orthopedic follow-up care 08/19/2020   Pain in joint of left shoulder 05/14/2020   Hypertensive left ventricular hypertrophy, without heart failure 12/18/2018   Obesity due to excess calories without serious comorbidity 12/18/2018   Type 2 diabetes mellitus without complication, without long-term current use of insulin (  [place]) 12/15/2018   Postmenopausal bleeding 06/05/2017   Neck pain 03/01/2014   Edema 01/25/2013   DM (diabetes mellitus) ([facility]) 01/25/2013   Dyspnea 01/25/2013   Abnormal ECG 01/25/2013   Hypertension    Acid reflux     Jones Bales, PT, DPT 08/12/2021, 3:32 PM  [facility] [address] [place], [place], [address] Phone: [phone]   Fax:  [phone]  Name: MICHIKO LINEMAN MRN: [id number] Date of Birth: [birth year]

## 2021-08-18 ENCOUNTER — Other Ambulatory Visit: Payer: Self-pay | Admitting: Student

## 2021-08-18 ENCOUNTER — Telehealth (HOSPITAL_COMMUNITY): Payer: Self-pay

## 2021-08-18 ENCOUNTER — Other Ambulatory Visit (HOSPITAL_COMMUNITY): Payer: Self-pay | Admitting: Physician Assistant

## 2021-08-18 ENCOUNTER — Other Ambulatory Visit (HOSPITAL_COMMUNITY): Payer: Self-pay

## 2021-08-18 ENCOUNTER — Other Ambulatory Visit (HOSPITAL_BASED_OUTPATIENT_CLINIC_OR_DEPARTMENT_OTHER): Payer: Self-pay

## 2021-08-18 NOTE — Telephone Encounter (Signed)
Transitions of Care Pharmacy  ° °Call attempted for a pharmacy transitions of care follow-up. HIPAA appropriate voicemail was left with call back information provided.  ° °Call attempt #1. Will follow-up in 2-3 days.  °  °

## 2021-08-18 NOTE — H&P (Signed)
Chief Complaint: Patient was seen in consultation today for diagnostic cerebral angiogram at the request of de Mont Dutton Rodrigues,[place]  Referring Physician(s): de Mont Dutton [place]  Supervising Physician: Pedro Earls  Patient Status: [facility] - Out-pt  History of Present Illness: MARCEY PERSAD is a 61 y.o. 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 Karenann Cai.   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  The left sided weakness was improved after TNK, however recovery further complicated by right hand swelling related to radial artery puncture site rebleed which was controlled  by manual pressure and no further treatments.  Patient was admitted to inpatient rehab unit from 1/19-1/27, she had resolution of the left-sided weakness but developed right-sided weakness. Patient underwent MR Brain and CTA head and neck on 07/27/21 which showed:  MR brain wo Punctate foci of restricted diffusion, consistent with acute and subacute infarcts, with new infarcts, compared to 07/21/2021, seen in the left occipital lobe, left corona radiata, and left body of the caudate. Given multiple vascular territories, an embolic etiology is suspected.  CTA head neck  1. Unchanged 3 mm aneurysm projecting from the distal cavernous right ICA. No new aneurysm is seen. 2.  No intracranial large vessel occlusion or significant stenosis. 3.  No hemodynamically significant stenosis in the neck. 4. Redemonstrated prior coiling of an aneurysm at the left ICA terminus.  She was ultimately discharged home in stable condition, with residual right sided weakness. Patient had a follow up visit with Dr. Karenann Cai on 2/7,  when a diagnostic angiogram was recommended  to  exclude the possibility of a delayed migration of the web device resulting in stenosis of the parent artery. After thorough discussion and shared decision making, patient decided to proceed with the diagnostic cerebral angiogram.   Patient laying in bed, not in acute distress.  Patient reports right sided weakness is getting better with strength training and PT. Denise headache, vision change, weakness on extremities, fever, chills, shortness of breath, cough, chest pain, abdominal pain, nausea ,vomiting, and bleeding.   Past Medical History:  Diagnosis Date   Abnormal EKG    LVH with strain   Acid reflux    Asthma    Depression    Diabetes mellitus    A1C over 9   HLD (hyperlipidemia)    Hypertension    Noncompliance    Obesity    Pneumonia    Sleep apnea     Past Surgical History:  Procedure Laterality Date   CARDIAC CATHETERIZATION     CATARACT EXTRACTION Left 06/04/2021   COLONOSCOPY WITH PROPOFOL N/A 04/29/2015   Procedure: COLONOSCOPY WITH PROPOFOL;  Surgeon: Garlan Fair, MD;  Location: [facility];  Service: Endoscopy;  Laterality: N/A;   EYE SURGERY     HYSTEROSCOPY WITH D & C N/A 09/07/2017   Procedure: DILATATION AND CURETTAGE /HYSTEROSCOPY;  Surgeon: Sloan Leiter, MD;  Location: [place];  Service: Gynecology;  Laterality: N/A;   IR ANGIO INTRA EXTRACRAN SEL COM CAROTID INNOMINATE UNI R MOD SED  06/18/2021   IR ANGIO INTRA EXTRACRAN SEL INTERNAL CAROTID UNI L MOD SED  06/18/2021   IR ANGIO INTRA EXTRACRAN SEL INTERNAL CAROTID UNI R MOD SED  07/20/2021   IR ANGIO VERTEBRAL SEL VERTEBRAL UNI R MOD SED  06/18/2021  IR ANGIOGRAM FOLLOW UP STUDY  07/20/2021   IR CT HEAD LTD  07/20/2021   IR RADIOLOGIST EVAL & MGMT  06/19/2021   IR TRANSCATH/EMBOLIZ  07/20/2021   IR US GUIDE VASC ACCESS RIGHT  06/18/2021   KNEE ARTHROSCOPY W/ MENISCAL REPAIR Right    RADIOLOGY WITH ANESTHESIA N/A 07/20/2021   Procedure: IR WITH ANESTHESIA;  Surgeon: Pedro Earls, MD;  Location: [place];  Service: Radiology;  Laterality: N/A;    Allergies: Canagliflozin and Hydrocodone  Medications: Prior to Admission medications   Medication Sig Start Date End Date Taking? Authorizing Provider  acetaminophen (TYLENOL) 500 MG tablet Take 1,000 mg by mouth every 6 (six) hours as needed for moderate pain.   Yes [provider]  amLODipine (NORVASC) 10 MG tablet Take 1 tablet by mouth once daily. 07/31/21  Yes Angiulli, Lavon Paganini, PA-C  aspirin EC 81 MG EC tablet Take 1 tablet (81 mg total) by mouth daily. Swallow whole. 07/24/21  Yes Janine Ores, NP  carvedilol (COREG) 25 MG tablet Take 1.5 tablets (37.5 mg total) by mouth 2 (two) times daily with a meal. 07/31/21 11/02/21 Yes Angiulli, Lavon Paganini, PA-C  FARXIGA 5 MG TABS tablet TAKE 1 TABLET (5 MG TOTAL) BY MOUTH DAILY. 06/15/21  Yes Elayne Snare, MD  fluticasone [facility]) 50 MCG/ACT nasal spray Place 1 spray into both nostrils daily as needed for allergies or rhinitis.   Yes [provider]  losartan (COZAAR) 100 MG tablet Take 1 tablet (100 mg total) by mouth daily. 07/31/21  Yes Angiulli, Lavon Paganini, PA-C  MELATONIN PO Take 1 capsule by mouth at bedtime as needed (sleep).   Yes [provider]  metFORMIN (GLUCOPHAGE) 1000 MG tablet Take 1 tablet (1,000 mg total) by mouth 2 (two) times daily with a meal. Patient taking differently: Take 500-1,000 mg by mouth See admin instructions. Take 1000 mg in the morning and 500 mg in the afternoon 07/31/21  Yes Angiulli, Lavon Paganini, PA-C  Multiple Vitamins-Calcium (ONE-A-DAY WOMENS PO) Take 1 tablet by mouth daily.   Yes [provider]  pantoprazole (PROTONIX) 40 MG tablet Take 1 tablet (40 mg total) by mouth at bedtime. 07/31/21  Yes Angiulli, Lavon Paganini, PA-C  rosuvastatin (CRESTOR) 40 MG tablet Take 1 tablet (40 mg total) by mouth daily. 07/31/21  Yes Angiulli, Lavon Paganini, PA-C  Sodium Chloride, Hypertonic, (MURO 528 OP) Place 1 application  into the left eye daily.   Yes [provider]  ticagrelor (BRILINTA) 90 MG TABS tablet Take 1 tablet (90 mg total) by mouth 2 (two) times daily. 07/31/21  Yes Angiulli, Lavon Paganini, PA-C     Family History  Problem Relation Age of Onset   Cancer Mother    Hypertension Mother    Diabetes Mother    Breast cancer Maternal Aunt    Breast cancer Cousin     Social History   Socioeconomic History   Marital status: Married    Spouse name: Not on file   Number of children: Not on file   Years of education: Not on file   Highest education level: Not on file  Occupational History   Not on file  Tobacco Use   Smoking status: Never   Smokeless tobacco: Never  Vaping Use   Vaping Use: Never used  Substance and Sexual Activity   Alcohol use: Not Currently    Comment: occ   Drug use: No   Sexual activity: Not on file  Other Topics Concern  Not on file  Social History Narrative   ** Merged History Encounter **       Social Determinants of Health   Financial Resource Strain: Not on file  Food Insecurity: Not on file  Transportation Needs: Not on file  Physical Activity: Not on file  Stress: Not on file  Social Connections: Not on file     Review of Systems: A 12 point ROS discussed and pertinent positives are indicated in the HPI above.  All other systems are negative.  Vital Signs: BP 121/80    Pulse 71    Temp 98 F (36.7 C) (Oral)    Resp 16    Ht _0  (1.676 m)    Wt 234 lb (106.1 kg)    LMP 08/02/2013 Comment: spotting   SpO2 99%    BMI 37.77 kg/m   Physical Exam Vitals and nursing note reviewed.  Constitutional:      General: Patient is not in acute distress.    Appearance: Normal appearance. Patient is not ill-appearing.  HENT:     Head: Normocephalic and atraumatic.     Mouth/Throat:     Mouth: Mucous membranes are moist.     Pharynx: Oropharynx is clear.  Cardiovascular:     Rate and Rhythm: Normal rate and regular rhythm.     Pulses: Normal pulses.      Heart sounds: Normal heart sounds.  Pulmonary:     Effort: Pulmonary effort is normal.     Breath sounds: Normal breath sounds.  Abdominal:     General: Abdomen is flat. Bowel sounds are normal.     Palpations: Abdomen is soft.  Musculoskeletal:     Cervical back: Neck supple.  Skin:    General: Skin is warm and dry.     Coloration: Skin is not jaundiced or pale.  Neurological:     Mental Status: Patient is alert and oriented to person, place, and time.  Psychiatric:        Mood and Affect: Mood normal.        Behavior: Behavior normal.        Judgment: Judgment normal.    MD Evaluation Airway: WNL Heart: WNL Abdomen: WNL Chest/ Lungs: WNL ASA  Classification: 3 Mallampati/Airway Score: Two  Imaging: CT ANGIO HEAD NECK W WO CM  Result Date: 07/28/2021 CLINICAL DATA:  Stroke, determine embolic source, aneurysm cavernous portion of left ICA EXAM: CT ANGIOGRAPHY HEAD AND NECK TECHNIQUE: Multidetector CT imaging of the head and neck was performed using the standard protocol during bolus administration of intravenous contrast. Multiplanar CT image reconstructions and MIPs were obtained to evaluate the vascular anatomy. Carotid stenosis measurements (when applicable) are obtained utilizing NASCET criteria, using the distal internal carotid diameter as the denominator. RADIATION DOSE REDUCTION: This exam was performed according to the departmental dose-optimization program which includes automated exposure control, adjustment of the mA and/or kV according to patient size and/or use of iterative reconstruction technique. CONTRAST:  62m OMNIPAQUE IOHEXOL 350 MG/ML SOLN COMPARISON:  CTA 07/20/2021, CT head 07/24/2021 FINDINGS: CT HEAD FINDINGS Brain: No evidence of acute infarction, hemorrhage, cerebral edema, mass, mass effect, or midline shift. No hydrocephalus or extra-axial fluid collection. Periventricular white matter changes, likely the sequela of chronic small vessel ischemic  disease. Vascular: No hyperdense vessel. Aneurysmal coil at the left ICA terminus. Skull: Normal. Negative for fracture or focal lesion. Sinuses/Orbits: No acute finding. Other: The mastoid air cells are well aerated. CTA NECK FINDINGS Aortic arch: Two-vessel arch with  a common origin of the brachiocephalic and left common carotid arteries. Imaged portion shows no evidence of aneurysm or dissection. No significant stenosis of the major arch vessel origins. Right carotid system: No evidence of dissection, stenosis (50% or greater) or occlusion. Left carotid system: No evidence of dissection, stenosis (50% or greater) or occlusion. Vertebral arteries: No evidence of dissection, stenosis (50% or greater) or occlusion. Skeleton: No acute osseous abnormality. Other neck: None. Upper chest: No focal pulmonary opacity. Review of the MIP images confirms the above findings CTA HEAD FINDINGS Anterior circulation: Unchanged 3 mm superomedially projecting aneurysm arising from the distal cavernous right ICA (series 11, image 131), unchanged compared to 07/20/2021. Redemonstrated coiling of an aneurysm at the left ICA terminus at the origin of the left ACA and MCA (series 11, image 224). Both ICAs are otherwise patent to the termini without significant stenosis. Left A1 segment patent. Redemonstrated hypoplastic right A1. Normal anterior communicating artery. Anterior cerebral arteries are patent to their distal aspects. No M1 stenosis or occlusion. Normal MCA bifurcations. Distal MCA branches perfused and symmetric. Posterior circulation: Vertebral arteries patent to the vertebrobasilar junction without stenosis. Posterior inferior cerebral arteries patent bilaterally. Basilar patent to its distal aspect. Superior cerebellar arteries patent bilaterally. Bilateral P1 segments originate from the basilar artery. PCAs perfused to their distal aspects without stenosis. Bilateral posterior communicating arteries are visualized.  Venous sinuses: As permitted by contrast timing, patent. Anatomic variants: None significant Review of the MIP images confirms the above findings IMPRESSION: 1. Unchanged 3 mm aneurysm projecting from the distal cavernous right ICA. No new aneurysm is seen. 2.  No intracranial large vessel occlusion or significant stenosis. 3.  No hemodynamically significant stenosis in the neck. 4. Redemonstrated prior coiling of an aneurysm at the left ICA terminus. Electronically Signed   By: Merilyn Baba M.D.   On: 07/28/2021 19:25   CT HEAD WO CONTRAST (5MM)  Result Date: 07/24/2021 CLINICAL DATA:  Severe headache, hypertension, diabetes mellitus EXAM: CT HEAD WITHOUT CONTRAST TECHNIQUE: Contiguous axial images were obtained from the base of the skull through the vertex without intravenous contrast. RADIATION DOSE REDUCTION: This exam was performed according to the departmental dose-optimization program which includes automated exposure control, adjustment of the mA and/or kV according to patient size and/or use of iterative reconstruction technique. COMPARISON:  07/21/2021 FINDINGS: Brain: Normal ventricular morphology. No midline shift or mass effect. Small vessel chronic ischemic changes of deep cerebral white matter. No intracranial hemorrhage, mass lesion, or evidence of acute infarction. No extra-axial fluid collections. Vascular: No hyperdense vessels. Aneurysm coil at LEFT ICA bifurcation again seen. Skull: Intact Sinuses/Orbits: Clear Other: N/A IMPRESSION: Prior aneurysm coiling. Small vessel chronic ischemic changes of deep cerebral white matter. No acute intracranial abnormalities. Electronically Signed   By: Lavonia Dana M.D.   On: 07/24/2021 15:30   CT HEAD WO CONTRAST (5MM)  Result Date: 07/21/2021 CLINICAL DATA:  Stroke.  TNK follow-up EXAM: CT HEAD WITHOUT CONTRAST TECHNIQUE: Contiguous axial images were obtained from the base of the skull through the vertex without intravenous contrast. RADIATION DOSE  REDUCTION: This exam was performed according to the departmental dose-optimization program which includes automated exposure control, adjustment of the mA and/or kV according to patient size and/or use of iterative reconstruction technique. COMPARISON:  MRI head 07/21/2021.  CT head 07/20/2021 FINDINGS: Brain: Negative for acute hemorrhage. Ventricle size normal. Patchy white matter hypodensity bilaterally Small areas of acute infarct identified on diffusion-weighted imaging are not well delineated on CT. Vascular: Negative  for hyperdense vessel. Prior coiling of the terminal left ICA aneurysm. Skull: Negative Sinuses/Orbits: Mild mucosal edema paranasal sinuses. No orbital lesion. Left cataract extraction. Other: None IMPRESSION: 1. No acute intracranial hemorrhage 2. Diffuse white matter hypodensity compatible with ischemia. Small areas of restricted diffusion best seen on MRI today. Electronically Signed   By: Franchot Gallo M.D.   On: 07/21/2021 13:40   MR BRAIN WO CONTRAST  Result Date: 07/28/2021 CLINICAL DATA:  Stroke, follow-up EXAM: MRI HEAD WITHOUT CONTRAST TECHNIQUE: Multiplanar, multiecho pulse sequences of the brain and surrounding structures were obtained without intravenous contrast. COMPARISON:  07/21/2021 MRI FINDINGS: Brain: Again noted are multiple punctate foci of restricted diffusion in the bilateral cerebral hemispheres, with some new foci of restricted diffusion with ADC correlates: Left occipital lobe (series 2, image 17; left frontal lobe corona radiata (series 2, image 39); left body of the caudate (series 2, images 33 and 34). Additional foci of restricted diffusion correlate with those seen on the prior exam and are more likely to be subacute infarcts, although some of them continues to demonstrate diffusion restriction with persistent ADC correlates, such as the focus in the left external capsule (series 2, image 25), left occipital lobe (series 2, image 23 and 29), and left corona  radiata (series 2, image 37). Other previously noted infarcts demonstrate pseudonormalization of the ADC. No acute hemorrhage, mass, mass effect, or midline shift. Confluent T2 hyperintense signal in the periventricular white matter, likely the sequela of moderate to severe chronic small vessel ischemic disease. No hydrocephalus or extra-axial collection. Vascular: Normal flow voids. Skull and upper cervical spine: Normal marrow signal. Sinuses/Orbits: Mucosal thickening in the ethmoid air cells. Status post left lens replacement. Other: The mastoids are well aerated. IMPRESSION: Punctate foci of restricted diffusion, consistent with acute and subacute infarcts, with new infarcts, compared to 07/21/2021, seen in the left occipital lobe, left corona radiata, and left body of the caudate. Given multiple vascular territories, an embolic etiology is suspected. These results will be called to the ordering clinician or representative by the Radiologist Assistant, and communication documented in the PACS or [employer]. Electronically Signed   By: Merilyn Baba M.D.   On: 07/28/2021 00:13   MR BRAIN WO CONTRAST  Result Date: 07/21/2021 CLINICAL DATA:  61 year old female status post endovascular embolization of left ICA terminus aneurysm with web device. Neurologic deficit. EXAM: MRI HEAD WITHOUT CONTRAST TECHNIQUE: Multiplanar, multiecho pulse sequences of the brain and surrounding structures were obtained without intravenous contrast. COMPARISON:  CT head without contrast and CTA head and neck 07/20/2021. FINDINGS: Brain: There are scattered small - generally punctate - foci of diffusion restriction in both cerebral hemispheres. Most are in the left hemisphere involving the left MCA and left PCA territory. Among the most conspicuous is a 6 mm focus at the left external capsule abutting the lentiform (series 5, image 87 and series 7, image 70). Small involvement also at the right genu of the corpus callosum (right  ACA territory). Occasional right MCA involvement. Subtle T2 and FLAIR hyperintensity associated with the acute findings. With moderate superimposed widely scattered background white matter T2 and FLAIR hyperintensity. And mild to moderate associated heterogeneity in the bilateral basal ganglia. But no cortical encephalomalacia identified. Trace if any chronic cerebral blood products. Mild susceptibility artifact at the left ICA terminus related to endovascular embolization. No restricted diffusion to suggest acute infarction. No midline shift, mass effect, evidence of mass lesion, ventriculomegaly, extra-axial collection or acute intracranial hemorrhage. Vascular: Major intracranial  vascular flow voids are preserved. Skull and upper cervical spine: Negative visible cervical spine. Visualized bone marrow signal is within normal limits. Sinuses/Orbits: Postoperative changes to the left globe. Otherwise negative orbits. Paranasal Visualized paranasal sinuses and mastoids are stable and well aerated. Other: Mastoids are well aerated.  Negative visible scalp and face. IMPRESSION: 1. 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 Among the most conspicuous is a 6 mm lacune in the left external capsule abutting the lentiform. No associated hemorrhage or mass effect. 2. Underlying moderate for age signal changes in the brain compatible with chronic small vessel disease. Electronically Signed   By: Genevie Ann M.D.   On: 07/21/2021 05:30   IR Transcath/Emboliz  Result Date: 07/20/2021 INDICATION: Chene Kasinger Aguillard is a 61 year old female with past medical history significant for diabetes, hyperlipidemia, hypertension and cataract. She presented to the emergency room with headache on 06/05/2021. 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 She underwent a diagnostic cerebral angiogram on 06/19/2021 that  confirmed the presence of the 2 brain aneurysms seen on prior CTA. She comes to our service today for elective treatment of her left ICA aneurysm. In preparation to today's procedure, she was started on Brilinta 90 mg b.i.d. and aspirin 81 mg q.d. EXAM: ULTRASOUND-GUIDED VASCULAR ACCESS DIAGNOSTIC CEREBRAL ANGIOGRAM ENDOVASCULAR ANEURYSM EMBOLIZATION FLAT PANEL HEAD CT COMPARISON:  Cerebral angiogram June 18, 2021. MEDICATIONS: Ancef 2 g IV. The antibiotic was administered within 1 hour of the procedure. ANESTHESIA/SEDATION: The study was performed in the general anesthesia. CONTRAST:  106 cc of Omnipaque 300 milligram/mL FLUOROSCOPY TIME:  Fluoroscopy Time: 42 minutes 48 seconds (1,843 mGy). COMPLICATIONS: Delayed - approximately one hour after procedure, patient developed left sided weakness, unclear whether procedure related given intervention in the left ICA. TECHNIQUE: Informed written consent was obtained from the patient after a thorough discussion of the procedural risks, benefits and alternatives. All questions were addressed. Maximal Sterile Barrier Technique was utilized including caps, mask, sterile gowns, sterile gloves, sterile drape, hand hygiene and skin antiseptic. A timeout was performed prior to the initiation of the procedure. Using the modified Seldinger technique and a micropuncture kit, access was gained to the distal right radial artery at the anatomical snuffbox and a 7 French sheath was placed. Real-time ultrasound guidance was utilized for vascular access including the acquisition of a permanent ultrasound image documenting patency of the accessed vessel. Slow intra arterial infusion of 5,000 IU heparin, 5 mg Verapamil and 200 mcg nitroglycerin diluted in patient's own blood was performed. No significant fluctuation in patient's blood pressure seen. Then, a right radial artery angiogram was obtained via sheath side port. Next, a 5 [place] Simmons 2 glide catheter was navigated over a  0.035" Terumo Glidewire into the right subclavian artery under fluoroscopic guidance. The catheter was then advanced into the left common carotid artery. Frontal and lateral angiograms of the neck were obtained. Using biplane roadmap guidance, the catheter was then advanced into the left internal carotid artery. Frontal, lateral, magnified waters and magnified lateral views of the head were obtained. FINDINGS: 1. Normal brachial artery branching pattern seen. No significant anatomical variation. The right radial artery caliber is adequate for vascular access. 2. 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 3. There is brisk vascular contrast filling of the bilateral ACA and left MCA vascular trees. The  visualized dural sinuses are patent. PROCEDURE: The Simmons 2 glide catheter was exchanged over the wire and under biplane roadmap for a 7 [place] Rist catheter which was placed in the distal cervical segment of the left ICA. Magnified frontal and lateral angiograms of the head were obtained in the working projections. Then, a [place] EX intermediate catheter was navigated through the risk catheter and over a via 17 microcatheter and a synchro 2 micro guidewire into the cavernous segment of the right ICA. Magnified frontal and lateral angiograms of the head were obtained in the working projections. The via microcatheter was then navigated over the wire into the left ICA terminus aneurysm pouch. The wire was removed. Then, a 4 x 2 mm web SL device was deployed into the aneurysm pouch. Frontal and lateral magnified angiograms of the head were obtained in the working projections. Adequate positioning of the device was noted. The device was then detached under fluoroscopy. Follow-up left internal carotid artery angiograms with magnified frontal and lateral views of the head in the working projections showed stable position of the device within the aneurysm pouch. Left ICA  angiograms with frontal and lateral views of the entire head were then obtained, showed no evidence of thromboembolic complication. Flat panel CT of the head was obtained and post processed in a separate workstation with concurrent attending physician supervision. Selected images were sent to PACS. No evidence of hemorrhagic complication noted. The catheter was subsequently withdrawn. An inflatable band was placed and inflated over the right hand access site. The vascular sheath was withdrawn and the band was slowly deflated until brisk flow was noted through the arteriotomy site. At this point, the band was reinflated with additional 2 cc of air to obtain patent hemostasis. 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 No evidence of hemorrhagic of thromboembolic complication. PLAN: Patient will be admitted to ICU for observation. Electronically Signed   By: Pedro Earls M.D.   On: 07/20/2021 15:53   IR Angiogram Follow Up Study  Result Date: 07/20/2021 INDICATION: Kyleah Pensabene Ezelle is a 61 year old female with past medical history significant for diabetes, hyperlipidemia, hypertension and cataract. She presented to the emergency room with headache on 06/05/2021. 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 She underwent a diagnostic cerebral angiogram on 06/19/2021 that confirmed the presence of the 2 brain aneurysms seen on prior CTA. She comes to our service today for elective treatment of her left ICA aneurysm. In preparation to today's procedure, she was started on Brilinta 90 mg b.i.d. and aspirin 81 mg q.d. EXAM: ULTRASOUND-GUIDED VASCULAR ACCESS DIAGNOSTIC CEREBRAL ANGIOGRAM ENDOVASCULAR ANEURYSM EMBOLIZATION FLAT PANEL HEAD CT COMPARISON:  Cerebral angiogram June 18, 2021. MEDICATIONS: Ancef 2 g IV. The antibiotic was administered within 1 hour of the  procedure. ANESTHESIA/SEDATION: The study was performed in the general anesthesia. CONTRAST:  106 cc of Omnipaque 300 milligram/mL FLUOROSCOPY TIME:  Fluoroscopy Time: 42 minutes 48 seconds (1,843 mGy). COMPLICATIONS: Delayed - approximately one hour after procedure, patient developed left sided weakness, unclear whether procedure related given intervention in the left ICA. TECHNIQUE: Informed written consent was obtained from the patient after a thorough discussion of the procedural risks, benefits and alternatives. All questions were addressed. Maximal Sterile Barrier Technique was utilized including caps, mask, sterile gowns, sterile gloves, sterile drape, hand hygiene and skin antiseptic. A timeout was performed prior to  the initiation of the procedure. Using the modified Seldinger technique and a micropuncture kit, access was gained to the distal right radial artery at the anatomical snuffbox and a 7 French sheath was placed. Real-time ultrasound guidance was utilized for vascular access including the acquisition of a permanent ultrasound image documenting patency of the accessed vessel. Slow intra arterial infusion of 5,000 IU heparin, 5 mg Verapamil and 200 mcg nitroglycerin diluted in patient's own blood was performed. No significant fluctuation in patient's blood pressure seen. Then, a right radial artery angiogram was obtained via sheath side port. Next, a 5 [place] Simmons 2 glide catheter was navigated over a 0.035" Terumo Glidewire into the right subclavian artery under fluoroscopic guidance. The catheter was then advanced into the left common carotid artery. Frontal and lateral angiograms of the neck were obtained. Using biplane roadmap guidance, the catheter was then advanced into the left internal carotid artery. Frontal, lateral, magnified waters and magnified lateral views of the head were obtained. FINDINGS: 1. Normal brachial artery branching pattern seen. No significant anatomical variation. The  right radial artery caliber is adequate for vascular access. 2. 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 3. There is brisk vascular contrast filling of the bilateral ACA and left MCA vascular trees. The visualized dural sinuses are patent. PROCEDURE: The Simmons 2 glide catheter was exchanged over the wire and under biplane roadmap for a 7 [place] Rist catheter which was placed in the distal cervical segment of the left ICA. Magnified frontal and lateral angiograms of the head were obtained in the working projections. Then, a [place] EX intermediate catheter was navigated through the risk catheter and over a via 17 microcatheter and a synchro 2 micro guidewire into the cavernous segment of the right ICA. Magnified frontal and lateral angiograms of the head were obtained in the working projections. The via microcatheter was then navigated over the wire into the left ICA terminus aneurysm pouch. The wire was removed. Then, a 4 x 2 mm web SL device was deployed into the aneurysm pouch. Frontal and lateral magnified angiograms of the head were obtained in the working projections. Adequate positioning of the device was noted. The device was then detached under fluoroscopy. Follow-up left internal carotid artery angiograms with magnified frontal and lateral views of the head in the working projections showed stable position of the device within the aneurysm pouch. Left ICA angiograms with frontal and lateral views of the entire head were then obtained, showed no evidence of thromboembolic complication. Flat panel CT of the head was obtained and post processed in a separate workstation with concurrent attending physician supervision. Selected images were sent to PACS. No evidence of hemorrhagic complication noted. The catheter was subsequently withdrawn. An inflatable band was placed and inflated over the right hand access site. The vascular sheath was withdrawn and the band  was slowly deflated until brisk flow was noted through the arteriotomy site. At this point, the band was reinflated with additional 2 cc of air to obtain patent hemostasis. 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 No evidence of hemorrhagic of thromboembolic complication. PLAN: Patient will be admitted to ICU for observation. Electronically Signed   By: Pedro Earls M.D.   On: 07/20/2021 15:53   IR CT Head Ltd  Result Date: 07/20/2021 INDICATION: Tameko Halder Holland is a 61 year old female with past medical history significant for diabetes, hyperlipidemia, hypertension  and cataract. She presented to the emergency room with headache on 06/05/2021. 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 She underwent a diagnostic cerebral angiogram on 06/19/2021 that confirmed the presence of the 2 brain aneurysms seen on prior CTA. She comes to our service today for elective treatment of her left ICA aneurysm. In preparation to today's procedure, she was started on Brilinta 90 mg b.i.d. and aspirin 81 mg q.d. EXAM: ULTRASOUND-GUIDED VASCULAR ACCESS DIAGNOSTIC CEREBRAL ANGIOGRAM ENDOVASCULAR ANEURYSM EMBOLIZATION FLAT PANEL HEAD CT COMPARISON:  Cerebral angiogram June 18, 2021. MEDICATIONS: Ancef 2 g IV. The antibiotic was administered within 1 hour of the procedure. ANESTHESIA/SEDATION: The study was performed in the general anesthesia. CONTRAST:  106 cc of Omnipaque 300 milligram/mL FLUOROSCOPY TIME:  Fluoroscopy Time: 42 minutes 48 seconds (1,843 mGy). COMPLICATIONS: Delayed - approximately one hour after procedure, patient developed left sided weakness, unclear whether procedure related given intervention in the left ICA. TECHNIQUE: Informed written consent was obtained from the patient after a thorough discussion of the procedural risks, benefits and alternatives. All  questions were addressed. Maximal Sterile Barrier Technique was utilized including caps, mask, sterile gowns, sterile gloves, sterile drape, hand hygiene and skin antiseptic. A timeout was performed prior to the initiation of the procedure. Using the modified Seldinger technique and a micropuncture kit, access was gained to the distal right radial artery at the anatomical snuffbox and a 7 French sheath was placed. Real-time ultrasound guidance was utilized for vascular access including the acquisition of a permanent ultrasound image documenting patency of the accessed vessel. Slow intra arterial infusion of 5,000 IU heparin, 5 mg Verapamil and 200 mcg nitroglycerin diluted in patient's own blood was performed. No significant fluctuation in patient's blood pressure seen. Then, a right radial artery angiogram was obtained via sheath side port. Next, a 5 [place] Simmons 2 glide catheter was navigated over a 0.035" Terumo Glidewire into the right subclavian artery under fluoroscopic guidance. The catheter was then advanced into the left common carotid artery. Frontal and lateral angiograms of the neck were obtained. Using biplane roadmap guidance, the catheter was then advanced into the left internal carotid artery. Frontal, lateral, magnified waters and magnified lateral views of the head were obtained. FINDINGS: 1. Normal brachial artery branching pattern seen. No significant anatomical variation. The right radial artery caliber is adequate for vascular access. 2. 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 3. There is brisk vascular contrast filling of the bilateral ACA and left MCA vascular trees. The visualized dural sinuses are patent. PROCEDURE: The Simmons 2 glide catheter was exchanged over the wire and under biplane roadmap for a 7 [place] Rist catheter which was placed in the distal cervical segment of the left ICA. Magnified frontal and lateral angiograms of the  head were obtained in the working projections. Then, a [place] EX intermediate catheter was navigated through the risk catheter and over a via 17 microcatheter and a synchro 2 micro guidewire into the cavernous segment of the right ICA. Magnified frontal and lateral angiograms of the head were obtained in the working projections. The via microcatheter was then navigated over the wire into the left ICA terminus aneurysm pouch. The wire was removed. Then, a 4 x 2 mm web SL device was deployed into the aneurysm pouch. Frontal and lateral magnified angiograms of the head were obtained in the working projections. Adequate positioning  of the device was noted. The device was then detached under fluoroscopy. Follow-up left internal carotid artery angiograms with magnified frontal and lateral views of the head in the working projections showed stable position of the device within the aneurysm pouch. Left ICA angiograms with frontal and lateral views of the entire head were then obtained, showed no evidence of thromboembolic complication. Flat panel CT of the head was obtained and post processed in a separate workstation with concurrent attending physician supervision. Selected images were sent to PACS. No evidence of hemorrhagic complication noted. The catheter was subsequently withdrawn. An inflatable band was placed and inflated over the right hand access site. The vascular sheath was withdrawn and the band was slowly deflated until brisk flow was noted through the arteriotomy site. At this point, the band was reinflated with additional 2 cc of air to obtain patent hemostasis. 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 No evidence of hemorrhagic of thromboembolic complication. PLAN: Patient will be admitted to ICU for observation. Electronically Signed   By: Pedro Earls M.D.   On: 07/20/2021 15:53   ECHOCARDIOGRAM COMPLETE  Result Date:  07/20/2021    ECHOCARDIOGRAM REPORT   Patient Name:   KWEEN BACORN Date of Exam: 07/20/2021 Medical Rec #:  [id number]        Height:       66.0 in Accession #:    [id number]       Weight:       231.0 lb Date of Birth:  1960/10/08        BSA:          2.126 m Patient Age:    77 years         BP:           138/80 mmHg Patient Gender: F                HR:           66 bpm. Exam Location:  Inpatient Procedure: 2D Echo Indications:    Stroke  History:        Patient has prior history of Echocardiogram examinations, most                 recent 10/30/2020. Abnormal ECG; Risk Factors:Diabetes,                 Hypertension and Dyslipidemia.  Sonographer:    Arlyss Gandy Referring Phys: [id number] [place]  1. Left ventricular ejection fraction, by estimation, is 65 to 70%. Left ventricular ejection fraction by PLAX is 66 %. The left ventricle has normal function. The left ventricle has no regional wall motion abnormalities. There is moderate asymmetric left ventricular hypertrophy of the basal-septal segment. Left ventricular diastolic parameters are consistent with Grade I diastolic dysfunction (impaired relaxation).  2. Right ventricular systolic function is normal. The right ventricular size is normal.  3. The mitral valve is grossly normal. Trivial mitral valve regurgitation.  4. The aortic valve is tricuspid. Aortic valve regurgitation is not visualized. Aortic valve sclerosis/calcification is present, without any evidence of aortic stenosis.  5. The inferior vena cava is normal in size with greater than 50% respiratory variability, suggesting right atrial pressure of 3 mmHg. Comparison(s): No prior Echocardiogram. 10/30/2020: LVEF 70-75%. FINDINGS  Left Ventricle: Left ventricular ejection fraction, by estimation, is 65 to 70%. Left ventricular ejection fraction by PLAX is 66 %. The left ventricle has normal function. The  left ventricle has no regional wall motion abnormalities. The left ventricular  internal cavity size was normal in size. There is moderate asymmetric left ventricular hypertrophy of the basal-septal segment. Left ventricular diastolic parameters are consistent with Grade I diastolic dysfunction (impaired relaxation). Indeterminate filling pressures. Right Ventricle: The right ventricular size is normal. No increase in right ventricular wall thickness. Right ventricular systolic function is normal. Left Atrium: Left atrial size was normal in size. Right Atrium: Right atrial size was normal in size. Pericardium: There is no evidence of pericardial effusion. Mitral Valve: The mitral valve is grossly normal. Trivial mitral valve regurgitation. Tricuspid Valve: The tricuspid valve is grossly normal. Tricuspid valve regurgitation is trivial. Aortic Valve: The aortic valve is tricuspid. Aortic valve regurgitation is not visualized. Aortic valve sclerosis/calcification is present, without any evidence of aortic stenosis. Aortic valve mean gradient measures 7.0 mmHg. Aortic valve peak gradient measures 9.7 mmHg. Aortic valve area, by VTI measures 2.70 cm. Pulmonic Valve: The pulmonic valve was grossly normal. Pulmonic valve regurgitation is trivial. Aorta: The aortic root and ascending aorta are structurally normal, with no evidence of dilitation. Venous: The inferior vena cava is normal in size with greater than 50% respiratory variability, suggesting right atrial pressure of 3 mmHg. IAS/Shunts: No atrial level shunt detected by color flow Doppler.  LEFT VENTRICLE PLAX 2D LV EF:         Left            Diastology                ventricular     LV e' medial:    5.87 cm/s                ejection        LV E/e' medial:  12.0                fraction by     LV e' lateral:   8.70 cm/s                PLAX is 66      LV E/e' lateral: 8.1                %. LVIDd:         3.87 cm LVIDs:         2.49 cm LV PW:         1.22 cm LV IVS:        1.55 cm LVOT diam:     2.00 cm LV SV:         108 LV SV Index:   51 LVOT  Area:     3.14 cm  RIGHT VENTRICLE RV S prime:     16.80 cm/s TAPSE (M-mode): 2.3 cm LEFT ATRIUM             Index LA diam:        2.70 cm 1.27 cm/m LA Vol (A2C):   55.4 ml 26.06 ml/m LA Vol (A4C):   34.1 ml 16.04 ml/m LA Biplane Vol: 44.3 ml 20.84 ml/m  AORTIC VALVE AV Area (Vmax):    2.94 cm AV Area (Vmean):   2.56 cm AV Area (VTI):     2.70 cm AV Vmax:           156.00 cm/s AV Vmean:          125.000 cm/s AV VTI:            0.399 m AV Peak Grad:  9.7 mmHg AV Mean Grad:      7.0 mmHg LVOT Vmax:         146.00 cm/s LVOT Vmean:        102.000 cm/s LVOT VTI:          0.343 m LVOT/AV VTI ratio: 0.86  AORTA Ao Root diam: 3.20 cm Ao Asc diam:  3.30 cm MITRAL VALVE MV Area (PHT): 2.12 cm     SHUNTS MV Decel Time: 357 msec     Systemic VTI:  0.34 m MV E velocity: 70.30 cm/s   Systemic Diam: 2.00 cm MV A velocity: 103.00 cm/s MV E/A ratio:  0.68 Lyman Bishop MD Electronically signed by Lyman Bishop MD Signature Date/Time: 07/20/2021/3:41:45 PM    Final    CT HEAD CODE STROKE WO CONTRAST`  Result Date: 07/20/2021 CLINICAL DATA:  Code stroke. EXAM: CT HEAD WITHOUT CONTRAST TECHNIQUE: Contiguous axial images were obtained from the base of the skull through the vertex without intravenous contrast. RADIATION DOSE REDUCTION: This exam was performed according to the departmental dose-optimization program which includes automated exposure control, adjustment of the mA and/or kV according to patient size and/or use of iterative reconstruction technique. COMPARISON:  06/08/2021 FINDINGS: Brain: No evidence of acute infarction, hemorrhage, cerebral edema, mass, mass effect, or midline shift. Ventricles and sulci are normal for age. No extra-axial fluid collection. Periventricular white matter changes, likely the sequela of chronic small vessel ischemic disease. Vascular: No hyperdense vessel or unexpected calcification. Skull: Normal. Negative for fracture or focal lesion. Sinuses/Orbits: Mucosal thickening in the  maxillary sinuses. Status post left lens replacement. Other: The mastoid air cells are well aerated. ASPECTS [facility] Stroke Program Early CT Score) - Ganglionic level infarction (caudate, lentiform nuclei, internal capsule, insula, M1-M3 cortex): 7 - Supraganglionic infarction (M4-M6 cortex): 3 Total score (0-10 with 10 being normal): 10 IMPRESSION: 1. No acute intracranial process. 2. ASPECTS is 10 Code stroke imaging results were communicated on 07/20/2021 at 11:56 am to provider Dr. Lorrin Goodell via secure text paging. Electronically Signed   By: Merilyn Baba M.D.   On: 07/20/2021 11:57   CT ANGIO HEAD NECK W WO CM (CODE STROKE)  Result Date: 07/20/2021 CLINICAL DATA:  Neuro deficit, stroke suspected EXAM: CT ANGIOGRAPHY HEAD AND NECK TECHNIQUE: Multidetector CT imaging of the head and neck was performed using the standard protocol during bolus administration of intravenous contrast. Multiplanar CT image reconstructions and MIPs were obtained to evaluate the vascular anatomy. Carotid stenosis measurements (when applicable) are obtained utilizing NASCET criteria, using the distal internal carotid diameter as the denominator. RADIATION DOSE REDUCTION: This exam was performed according to the departmental dose-optimization program which includes automated exposure control, adjustment of the mA and/or kV according to patient size and/or use of iterative reconstruction technique. CONTRAST:  39m OMNIPAQUE IOHEXOL 350 MG/ML SOLN COMPARISON:  06/05/2021 CTA head, correlation is also made with same day CT head FINDINGS: CT HEAD FINDINGS For noncontrast findings, please see same day CT head. CTA NECK FINDINGS Aortic arch: Two-vessel arch with a common origin of the brachiocephalic and left common carotid arteries. Imaged portion shows no evidence of aneurysm or dissection. No significant stenosis of the major arch vessel origins. Right carotid system: No evidence of dissection, stenosis (50% or greater) or occlusion.  Calcified and noncalcified plaque at the bifurcation, which is not hemodynamically significant. Left carotid system: No evidence of dissection, stenosis (50% or greater) or occlusion. Calcified and noncalcified plaque at the bifurcation which is not hemodynamically significant. Vertebral arteries:  Codominant. No evidence of dissection, stenosis (50% or greater) or occlusion. Skeleton: No acute osseous abnormality. Other neck: Negative. Upper chest: No focal pulmonary opacity or pleural effusion. Review of the MIP images confirms the above findings CTA HEAD FINDINGS Anterior circulation: Both internal carotid arteries are patent to the termini. Unchanged 3 mm supero medially projecting aneurysm arising from the distal cavernous right ICA (series 7, image 244). Interval coiling of the previously noted superiorly projecting aneurysm arising from the left ICA terminus at the origin of the left ACA and MCA (series 7, image 224). A1 segments patent, with redemonstrated hypoplastic right A1. Normal anterior communicating artery. Anterior cerebral arteries are patent to their distal aspects. No M1 stenosis or occlusion. Normal MCA bifurcations. Distal MCA branches perfused and symmetric. Posterior circulation: Vertebral arteries patent to the vertebrobasilar junction without stenosis. Posterior inferior cerebral arteries patent bilaterally. Basilar patent to its distal aspect. Superior cerebellar arteries patent bilaterally. Bilateral P1 segments originate from the basilar artery. PCAs perfused to their distal aspects without stenosis. The bilateral posterior communicating arteries are patent. Venous sinuses: As permitted by contrast timing, patent. Anatomic variants: None significant Review of the MIP images confirms the above findings IMPRESSION: 1. 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 2.  No intracranial large vessel occlusion or significant stenosis. 3.  No hemodynamically significant stenosis  in the neck. 4. Unchanged 3 mm aneurysm projecting from the distal cavernous right ICA. Code stroke imaging results were communicated on 07/20/2021 at 12:39 pm to provider Dr. Lorrin Goodell via secure text paging. Electronically Signed   By: Merilyn Baba M.D.   On: 07/20/2021 12:40   IR ANGIO INTRA EXTRACRAN SEL INTERNAL CAROTID UNI R MOD SED  Result Date: 07/20/2021 INDICATION: Jolaine Fryberger Tindall is a 61 year old female with past medical history significant for diabetes, hyperlipidemia, hypertension and cataract. She presented to the emergency room with headache on 06/05/2021. 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 She underwent a diagnostic cerebral angiogram on 06/19/2021 that confirmed the presence of the 2 brain aneurysms seen on prior CTA. She comes to our service today for elective treatment of her left ICA aneurysm. In preparation to today's procedure, she was started on Brilinta 90 mg b.i.d. and aspirin 81 mg q.d. EXAM: ULTRASOUND-GUIDED VASCULAR ACCESS DIAGNOSTIC CEREBRAL ANGIOGRAM ENDOVASCULAR ANEURYSM EMBOLIZATION FLAT PANEL HEAD CT COMPARISON:  Cerebral angiogram June 18, 2021. MEDICATIONS: Ancef 2 g IV. The antibiotic was administered within 1 hour of the procedure. ANESTHESIA/SEDATION: The study was performed in the general anesthesia. CONTRAST:  106 cc of Omnipaque 300 milligram/mL FLUOROSCOPY TIME:  Fluoroscopy Time: 42 minutes 48 seconds (1,843 mGy). COMPLICATIONS: Delayed - approximately one hour after procedure, patient developed left sided weakness, unclear whether procedure related given intervention in the left ICA. TECHNIQUE: Informed written consent was obtained from the patient after a thorough discussion of the procedural risks, benefits and alternatives. All questions were addressed. Maximal Sterile Barrier Technique was utilized including caps, mask, sterile gowns, sterile gloves, sterile drape, hand  hygiene and skin antiseptic. A timeout was performed prior to the initiation of the procedure. Using the modified Seldinger technique and a micropuncture kit, access was gained to the distal right radial artery at the anatomical snuffbox and a 7 French sheath was placed. Real-time ultrasound guidance was utilized for vascular access including the acquisition of a permanent ultrasound image documenting patency of the accessed vessel. Slow intra  arterial infusion of 5,000 IU heparin, 5 mg Verapamil and 200 mcg nitroglycerin diluted in patient's own blood was performed. No significant fluctuation in patient's blood pressure seen. Then, a right radial artery angiogram was obtained via sheath side port. Next, a 5 [place] Simmons 2 glide catheter was navigated over a 0.035" Terumo Glidewire into the right subclavian artery under fluoroscopic guidance. The catheter was then advanced into the left common carotid artery. Frontal and lateral angiograms of the neck were obtained. Using biplane roadmap guidance, the catheter was then advanced into the left internal carotid artery. Frontal, lateral, magnified waters and magnified lateral views of the head were obtained. FINDINGS: 1. Normal brachial artery branching pattern seen. No significant anatomical variation. The right radial artery caliber is adequate for vascular access. 2. 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 3. There is brisk vascular contrast filling of the bilateral ACA and left MCA vascular trees. The visualized dural sinuses are patent. PROCEDURE: The Simmons 2 glide catheter was exchanged over the wire and under biplane roadmap for a 7 [place] Rist catheter which was placed in the distal cervical segment of the left ICA. Magnified frontal and lateral angiograms of the head were obtained in the working projections. Then, a [place] EX intermediate catheter was navigated through the risk catheter and over a via 17  microcatheter and a synchro 2 micro guidewire into the cavernous segment of the right ICA. Magnified frontal and lateral angiograms of the head were obtained in the working projections. The via microcatheter was then navigated over the wire into the left ICA terminus aneurysm pouch. The wire was removed. Then, a 4 x 2 mm web SL device was deployed into the aneurysm pouch. Frontal and lateral magnified angiograms of the head were obtained in the working projections. Adequate positioning of the device was noted. The device was then detached under fluoroscopy. Follow-up left internal carotid artery angiograms with magnified frontal and lateral views of the head in the working projections showed stable position of the device within the aneurysm pouch. Left ICA angiograms with frontal and lateral views of the entire head were then obtained, showed no evidence of thromboembolic complication. Flat panel CT of the head was obtained and post processed in a separate workstation with concurrent attending physician supervision. Selected images were sent to PACS. No evidence of hemorrhagic complication noted. The catheter was subsequently withdrawn. An inflatable band was placed and inflated over the right hand access site. The vascular sheath was withdrawn and the band was slowly deflated until brisk flow was noted through the arteriotomy site. At this point, the band was reinflated with additional 2 cc of air to obtain patent hemostasis. 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 No evidence of hemorrhagic of thromboembolic complication. PLAN: Patient will be admitted to ICU for observation. Electronically Signed   By: Pedro Earls M.D.   On: 07/20/2021 15:53    Labs:  CBC: Recent Labs    06/18/21 0950 07/20/21 0642 07/24/21 0536 08/19/21 0642  WBC 5.2 6.0 5.8 5.9  HGB 12.4 11.9* 11.1* 11.3*  HCT 38.8 36.5 33.4* 34.0*  PLT 251 252 216 229     COAGS: Recent Labs    06/18/21 0950 07/20/21 0642 07/20/21 1234 08/19/21 0642  INR 1.1 0.9 1.1 1.0  APTT  --   --  152*  --     BMP: Recent Labs  07/20/21 3112 07/24/21 0536 07/31/21 0738 08/19/21 0642  NA 139 139 138 142  K 4.1 3.9 3.5 3.5  CL 103 107 107 109  CO2 _0 GLUCOSE 210* 201* 151* 159*  BUN _1 CALCIUM 9.7 9.0 9.2 9.3  CREATININE 1.00 0.76 0.93 0.88  GFRNONAA >60 >60 >60 >60    LIVER FUNCTION TESTS: Recent Labs    09/05/20 1522 06/08/21 0829 07/07/21 1346 07/24/21 0536  BILITOT 0.4 0.4 0.4 0.3  AST _2 ALT _3 ALKPHOS 80 49 39 40  PROT 7.6 7.6 7.6 6.8  ALBUMIN 4.0 3.9 4.0 3.5    TUMOR MARKERS: No results for input(s): AFPTM, CEA, CA199, CHROMGRNA in the last 8760 hours.  Assessment and Plan: 60 y.o. 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 Karenann Cai.  Recovery from the endovascular embolization was complicated by periprocedural stroke and right hand swelling due to right RA puncture site rebleed, patient was admitted to inpatient rehab unit from 1/19-1/27.  Recovery further complicated by developing right sided weakness while in inpt rehab, MR brain on 07/27/21 showed imagine features concerning for possible delayed migration of the web device resulting in stenosis of the parent artery. Patient is in need of a diagnostic cerebral angiogram to exclude migration of the web device.   Patient presents to [facility] for the procedure.  NPO since MN VSS CBC stable BMP stable, RF normal  INR 1.0  Patient has been taking Brilinta 90 mg BID and asa 81 mg qd as instructed.  Patient on metformin BID, she reports that she did not take her metformin this morning - informed patient to NOT take metformin for 24 hours post procedure to prevent AKI.   Risks and benefits of cerebral angiogram with intervention were discussed with  the patient including, but not limited to bleeding, infection, vascular injury, contrast induced renal failure, stroke or even death.  This interventional procedure involves the use of X-rays and because of the nature of the planned procedure, it is possible that we will have prolonged use of X-ray fluoroscopy.  Potential radiation risks to you include (but are not limited to) the following: - A slightly elevated risk for cancer  several years later in life. This risk is typically less than 0.5% percent. This risk is low in comparison to the normal incidence of human cancer, which is 33% for women and 50% for men according to the [employer]. - Radiation induced injury can include skin redness, resembling a rash, tissue breakdown / ulcers and hair loss (which can be temporary or permanent).   The likelihood of either of these occurring depends on the difficulty of the procedure and whether you are sensitive to radiation due to previous procedures, disease, or genetic conditions.   IF your procedure requires a prolonged use of radiation, you will be notified and given written instructions for further action.  It is your responsibility to monitor the irradiated area for the 2 weeks following the procedure and to notify your physician if you are concerned that you have suffered a radiation induced injury.    All of the patient's questions were answered, patient is agreeable to proceed.  Consent signed and in chart.     Thank you for this interesting consult.  I greatly enjoyed meeting PATTY LEITZKE and look forward to participating in their  care.  A copy of this report was sent to the requesting provider on this date.  Electronically Signed: Tera Mater, PA-C 08/19/2021, 8:59 AM   I spent a total of    25 Minutes in face to face in clinical consultation, greater than 50% of which was counseling/coordinating care for diagnostic cerebral angiogram.   This chart was dictated  using voice recognition software.  Despite best efforts to proofread,  errors can occur which can change the documentation meaning.

## 2021-08-19 ENCOUNTER — Encounter (HOSPITAL_COMMUNITY): Payer: Self-pay

## 2021-08-19 ENCOUNTER — Other Ambulatory Visit (HOSPITAL_COMMUNITY): Payer: Self-pay | Admitting: Neuroradiology

## 2021-08-19 ENCOUNTER — Other Ambulatory Visit: Payer: Self-pay

## 2021-08-19 ENCOUNTER — Ambulatory Visit (HOSPITAL_COMMUNITY)
Admission: RE | Admit: 2021-08-19 | Discharge: 2021-08-19 | Disposition: A | Discharge status: Home / Self Care | Payer: BC Managed Care – PPO | Source: Ambulatory Visit | Attending: Neuroradiology | Admitting: Neuroradiology

## 2021-08-19 DIAGNOSIS — Z7984 Long term (current) use of oral hypoglycemic drugs: Secondary | ICD-10-CM | POA: Insufficient documentation

## 2021-08-19 DIAGNOSIS — E785 Hyperlipidemia, unspecified: Secondary | ICD-10-CM | POA: Diagnosis not present

## 2021-08-19 DIAGNOSIS — J45909 Unspecified asthma, uncomplicated: Secondary | ICD-10-CM | POA: Diagnosis not present

## 2021-08-19 DIAGNOSIS — I10 Essential (primary) hypertension: Secondary | ICD-10-CM | POA: Insufficient documentation

## 2021-08-19 DIAGNOSIS — I671 Cerebral aneurysm, nonruptured: Secondary | ICD-10-CM | POA: Insufficient documentation

## 2021-08-19 DIAGNOSIS — E119 Type 2 diabetes mellitus without complications: Secondary | ICD-10-CM | POA: Insufficient documentation

## 2021-08-19 HISTORY — PX: IR US GUIDE VASC ACCESS RIGHT: IMG2390

## 2021-08-19 HISTORY — PX: IR ANGIO INTRA EXTRACRAN SEL INTERNAL CAROTID BILAT MOD SED: IMG5363

## 2021-08-19 HISTORY — PX: IR ANGIO VERTEBRAL SEL VERTEBRAL UNI R MOD SED: IMG5368

## 2021-08-19 LAB — CBC
HCT: 34 % — ABNORMAL LOW (ref 36.0–46.0)
Hemoglobin: 11.3 g/dL — ABNORMAL LOW (ref 12.0–15.0)
MCH: 30.1 pg (ref 26.0–34.0)
MCHC: 33.2 g/dL (ref 30.0–36.0)
MCV: 90.4 fL (ref 80.0–100.0)
Platelets: 229 10*3/uL (ref 150–400)
RBC: 3.76 MIL/uL — ABNORMAL LOW (ref 3.87–5.11)
RDW: 14.4 % (ref 11.5–15.5)
WBC: 5.9 10*3/uL (ref 4.0–10.5)
nRBC: 0 % (ref 0.0–0.2)

## 2021-08-19 LAB — GLUCOSE, CAPILLARY
Glucose-Capillary: 142 mg/dL — ABNORMAL HIGH (ref 70–99)
Glucose-Capillary: 136 mg/dL — ABNORMAL HIGH (ref 70–99)

## 2021-08-19 LAB — BASIC METABOLIC PANEL
Anion gap: 9 (ref 5–15)
BUN: 18 mg/dL (ref 6–20)
CO2: 24 mmol/L (ref 22–32)
Calcium: 9.3 mg/dL (ref 8.9–10.3)
Chloride: 109 mmol/L (ref 98–111)
Creatinine, Ser: 0.88 mg/dL (ref 0.44–1.00)
GFR, Estimated: >60 mL/min (ref >60)
Glucose, Bld: 159 mg/dL — ABNORMAL HIGH (ref 70–99)
Potassium: 3.5 mmol/L (ref 3.5–5.1)
Sodium: 142 mmol/L (ref 135–145)

## 2021-08-19 LAB — PROTIME-INR
INR: 1 (ref 0.8–1.2)
Prothrombin Time: 12.9 seconds (ref 11.4–15.2)

## 2021-08-19 IMAGING — XA IR ANGIO INTRA EXTRACRAN SEL COM CAROTID INNOMINATE BILAT MOD SE
8 of 9 series · 10 of 24 positions shown · IV contrast (IODINE)
Comparison: Cerebral angiogram [DATE] and [DATE].

INDICATION: LASHAUNDA is a 60 y.o. female with a past medical history of
diabetes, hyperlipidemia, hypertension and cataract with 2
intracranial aneurysms found on CT angiogram performed on [DATE]
for headache workup in the emergency room with negative lumbar
puncture. She underwent a diagnostic cerebral angiogram on
[DATE] that confirmed the presence of 2 brain aneurysms. One of
the aneurysms is at the left carotid terminus, measuring 3 mm with a
small pseudolobulation. The second aneurysm is at the ophthalmic
segment of the right ICA and measures 2.75 mm. On [DATE], she
underwent endovascular embolization of her left ICA terminus
aneurysm with a web device. At PACU, she initially felt a mild
left-sided paresthesia. However, after few minutes, she developed
left-sided weakness. Code stroke was activated into was taken to a
CT/CTA of the head and neck which were negative for large vessel
occlusion. Her NIHSS was 8. She received LASHAUNDA with significant
improvement of her last sided weakness. She was admitted for
inpatient rehab. During rehab hospitalization, she had resolution of
the left-sided weakness but developed new right-sided weakness. MRI
of the brain performed on [DATE] showed new left-sided punctate
infarcts. She comes to our service today for a diagnostic cerebral
angiogram to evaluate treated left ICA terminus aneurysm for
possible delayed device migration which could explain the left-sided
stroke.

EXAM:
ULTRASOUND-GUIDED VASCULAR [REDACTED] CEREBRAL ANGIOGRAM
TECHNIQUE: Informed written consent was obtained from the patient after a
thorough discussion of the procedural risks, benefits and
alternatives. All questions were addressed.

[Series 2: cerebral care 2 · 1 of 10 frames shown (1 of 7)]
[frame 6/10]
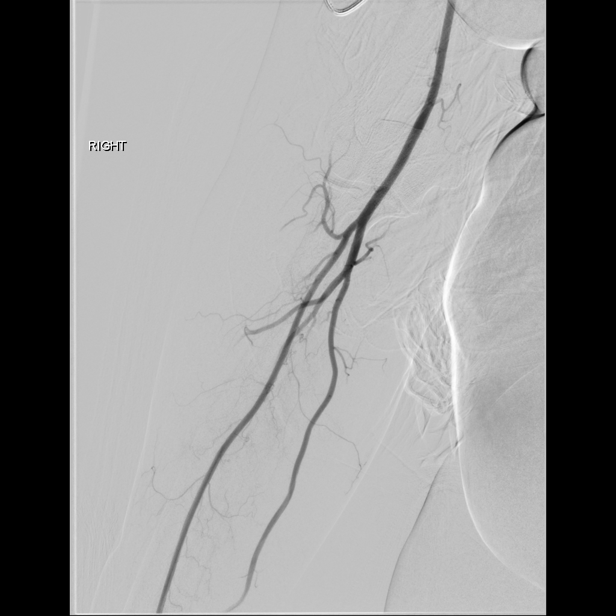

[Series 3: cerebral care 2 · 2 acquisitions, 1 frame shown (2 of 7)]
[im 1/2]
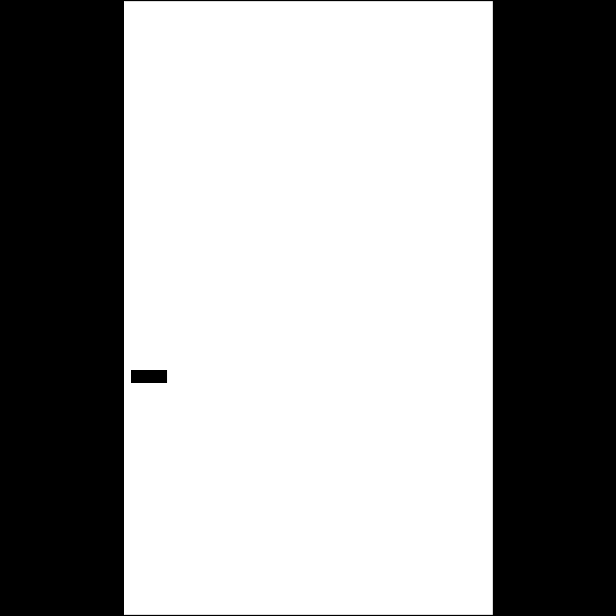

[Series 4: cerebral care 2 · 2 acquisitions, 1 frame shown (3 of 7)]
[im 1/2]
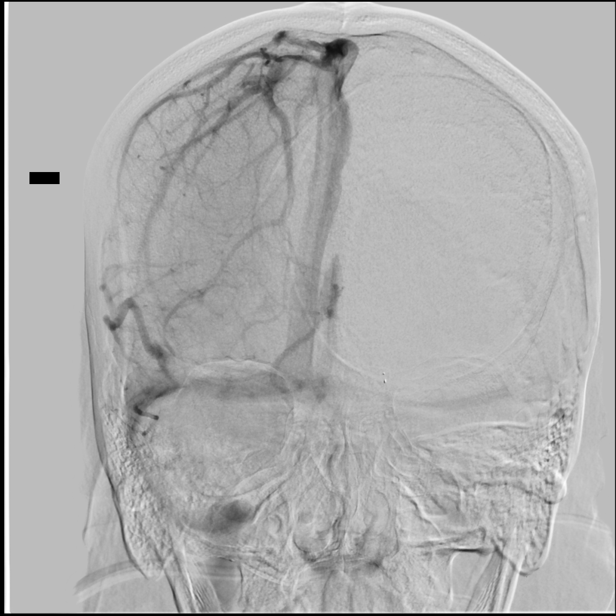

[Series 5: cerebral care 2 · 2 acquisitions, 1 frame shown (4 of 7)]
[im 1/2]
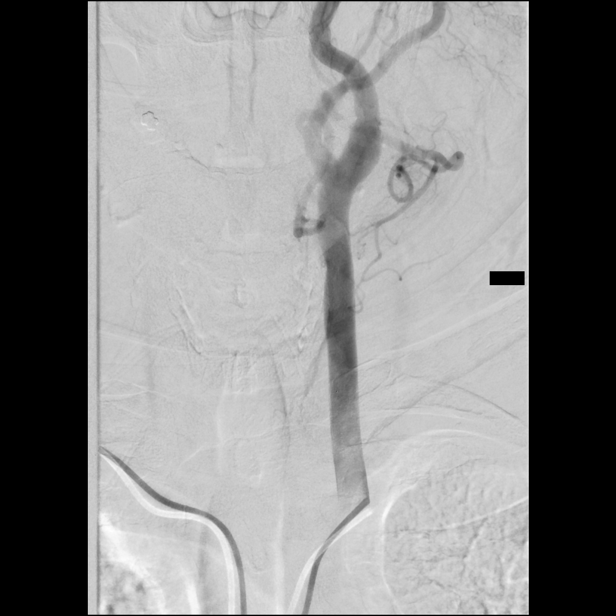

[Series 6: cerebral care 2 · 2 acquisitions, 1 frame shown (5 of 7)]
[im 1/2]
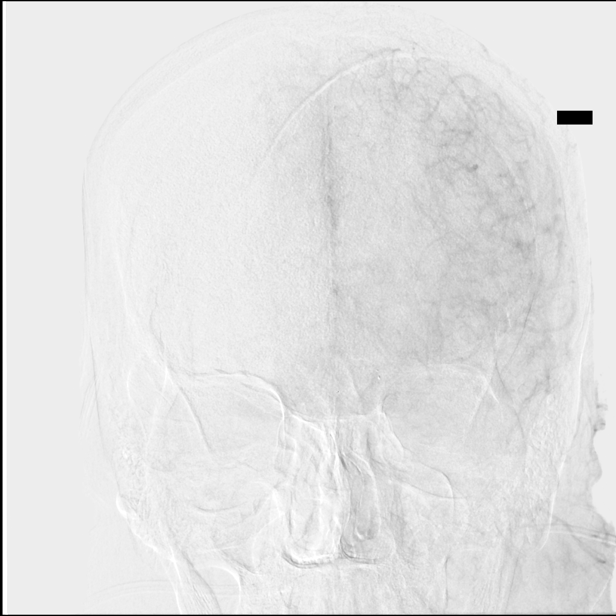

[Series 7: cerebral care 2 · 2 acquisitions, 1 frame shown (6 of 7)]
[im 1/2]
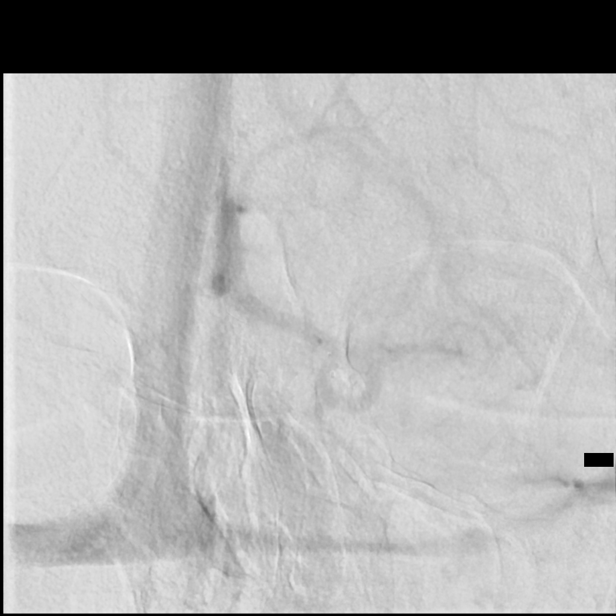

[Series 8: cerebral care 2 · 2 acquisitions, 1 frame shown (7 of 7)]
[im 1/2]
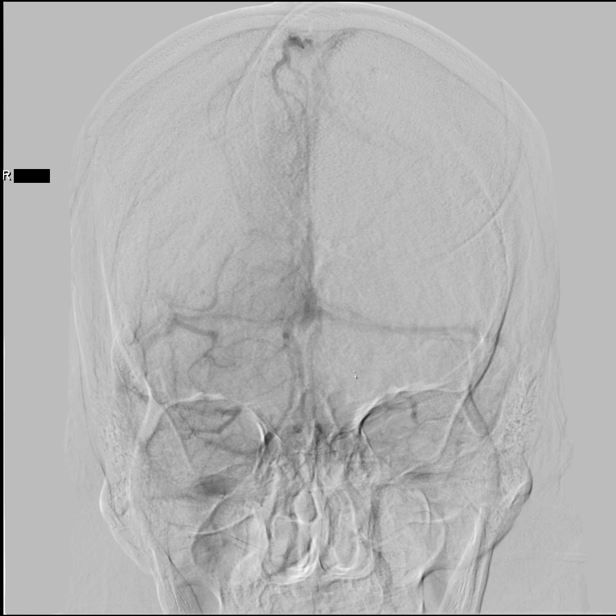

[Series 300: dr. (person_name). · 3 of 20 slices shown]
[im 6/20]
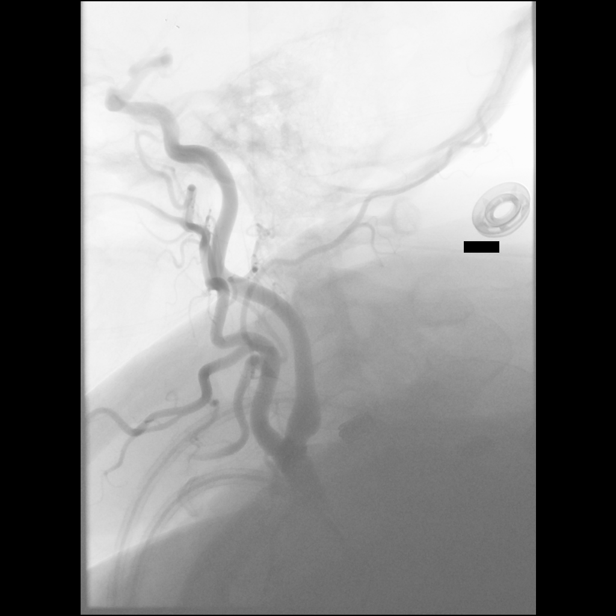
[im 12/20]
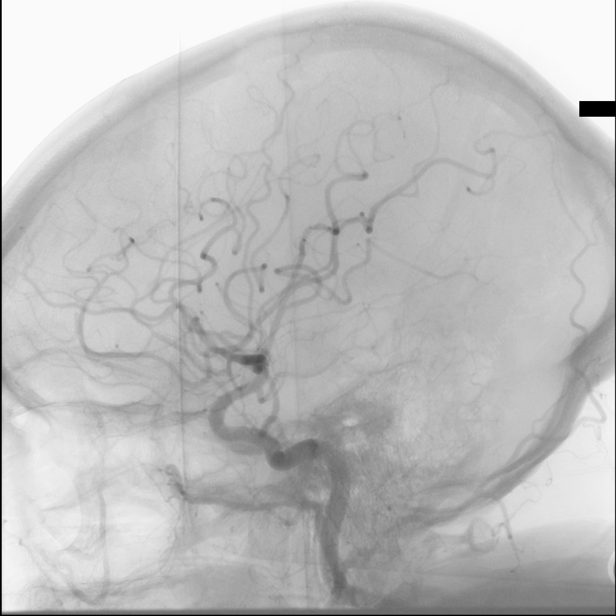
[im 17/20]
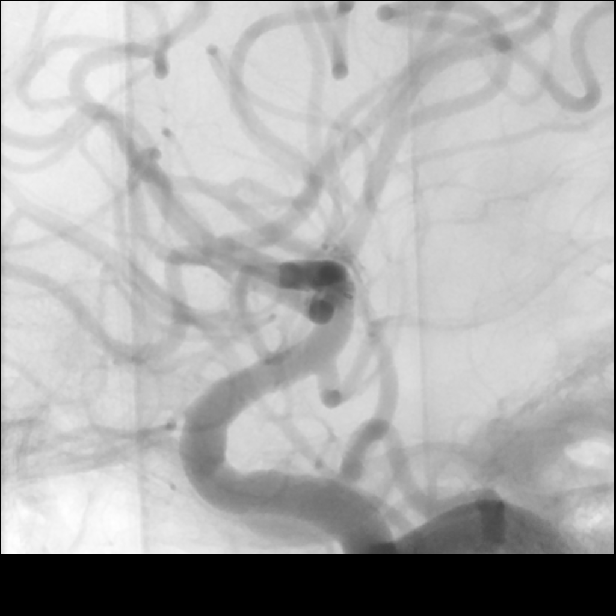

[10 of 24 positions shown; findings below may reference images not displayed]

MEDICATIONS:
No antibiotics administered.

ANESTHESIA/SEDATION:
Moderate (conscious) sedation was employed during this procedure. A
total of Versed 1 mg and Fentanyl 25 mcg was administered
intravenously by the radiology nurse.

Total intra-service moderate Sedation Time: 38 minutes. The
patient's level of consciousness and vital signs were monitored
continuously by radiology nursing throughout the procedure under my
direct supervision.

CONTRAST:  75 mL of Omnipaque 300 mL

FLUOROSCOPY:
Radiation Exposure Index (as provided by the fluoroscopic device):
474 mGy Kerma

COMPLICATIONS:
None immediate.
Maximal Sterile Barrier Technique was utilized including caps, mask,
sterile gowns, sterile gloves, sterile drape, hand hygiene and skin
antiseptic. A timeout was performed prior to the initiation of the
procedure.

Using the modified Seldinger technique and a micropuncture kit,
access was gained to the right radial artery at the anatomical
snuffbox and a French sheath was placed. Real-time ultrasound
guidance was utilized for vascular access including the acquisition
of a permanent ultrasound image documenting patency of the accessed
vessel. Slow intra arterial infusion of 5,000 LASHAUNDA heparin, 5 mg
Verapamil and 200 mcg nitroglycerin diluted in patient's own blood
was performed. No significant fluctuation in patient's blood
pressure seen. Then, a right radial artery angiogram was obtained
via sheath side port. Normal brachial artery branching pattern seen.
No significant anatomical variation. The right radial artery caliber
is adequate for vascular access.

Next, a 5 LASHAUNDA 2 glide catheter was navigated over a
0.035" [place]o Glidewire into the right subclavian artery under
fluoroscopic guidance. Under fluoroscopy, the catheter was placed
into the right common carotid artery. Frontal and lateral angiograms
of the neck were obtained. Using biplane roadmap, the catheter was
selectively advanced into the right internal carotid artery. Frontal
and lateral angiograms of the head were obtained.

Next, the catheter was placed into the left common carotid artery.
Frontal and lateral angiograms of the neck were obtained. Then,
frontal, lateral, magnified left anterior oblique and magnified
lateral views of the head were obtained centered on the carotid
terminus.

The catheter was then retracted into the right subclavian artery.
Using roadmap guidance, the catheter was selectively placed into the
right vertebral artery. Frontal and lateral angiograms of the head
were obtained.

The catheter was subsequently withdrawn.

An inflatable band was placed and inflated over the right wrist
access site. The vascular sheath was withdrawn and the band was
slowly deflated until brisk flow was noted through the arteriotomy
site. At this point, the band was reinflated with additional 2 cc of
air to obtain patent hemostasis.
FINDINGS: Right radial artery ultrasound and right radial artery angiogram:
The caliber of the distal right radial artery is appropriate for
angiogram access. The right radial artery and the right ulnar artery
have normal course and caliber. No significant anatomical variants
noted.

Right CCA angiograms: Cervical angiograms show mild atherosclerotic
right carotid bifurcation without hemodynamically significant
stenosis.

Right ICA angiograms: There is brisk vascular contrast filling of
the right MCA vascular tree. Hypoplastic right A1/ACA segment with
faint opacification of the right ACA vascular tree. A 2.75 mm right
ICA ophthalmic segment aneurysm is again identified, unchanged. An
infundibulum is noted at the origin of the right posterior
communicating artery. Luminal caliber is smooth and tapering. No
abnormally high-flow, early draining veins are seen. No regions of
abnormal hypervascularity are noted. The visualized dural sinuses
are patent.

Left CCA angiograms-cervical views: Cervical angiograms show mild
atherosclerotic changes of the left carotid bifurcation without
hemodynamically significant stenosis.

Left CCA angiograms-cranial views: There is brisk vascular contrast
filling of the left ACA and MCA vascular trees. There is also brisk
contrast opacification of the right ACA vascular tree via anterior
communicating artery. A 3 mm left ICA terminus aneurysm status post
endovascular embolization with a web device. Contrast opacification
of the aneurysm is noted with mildly delayed washout. No evidence of
device migration or impingement on the parent artery. Luminal
caliber is smooth and tapering. No abnormally high-flow, early
draining veins are seen. No regions of abnormal hypervascularity are
noted. The visualized dural sinuses are patent. The visualized
branches of the left external carotid artery are unremarkable.

Right vertebral artery angiograms: The right vertebral artery,
basilar artery, and bilateral posterior cerebral arteries are
unremarkable. Luminal caliber is smooth and tapering. No aneurysms
or abnormally high-flow, early draining veins are seen. No regions
of abnormal hypervascularity are noted. The visualized dural sinuses
are patent.

PROCEDURE:
No intervention performed.
IMPRESSION: 1. Status post endovascular embolization of the left ICA terminus
aneurysm with a web device. There is no evidence of device migration
or impingement on the parent artery which is widely patent. Contrast
opacification of the aneurysm is seen with mild delayed washout,
with the late occlusion likely related to the use of dual
antiplatelet therapy.
2. Stable appearance of 2.75 mm right ICA ophthalmic segment
aneurysm.

PLAN:
Follow-up diagnostic cerebral angiogram in 6 months to evaluate
aneurysm treatment outcome.

## 2021-08-19 MED ORDER — FENTANYL CITRATE (PF) 100 MCG/2ML IJ SOLN
INTRAMUSCULAR | Status: AC
  Filled 2021-08-19: qty 2

## 2021-08-19 MED ORDER — ACETAMINOPHEN 500 MG PO TABS
500.0000 mg | ORAL_TABLET | Freq: Once | ORAL | Status: AC
  Administered 2021-08-19: 500 mg via ORAL
  Filled 2021-08-19: qty 1

## 2021-08-19 MED ORDER — MIDAZOLAM HCL 2 MG/2ML IJ SOLN
INTRAMUSCULAR | Status: AC
  Filled 2021-08-19: qty 2

## 2021-08-19 MED ORDER — FENTANYL CITRATE (PF) 100 MCG/2ML IJ SOLN
INTRAMUSCULAR | Status: AC | PRN
  Administered 2021-08-19: 25 ug via INTRAVENOUS

## 2021-08-19 MED ORDER — LIDOCAINE HCL 1 % IJ SOLN
INTRAMUSCULAR | Status: AC
  Filled 2021-08-19: qty 20

## 2021-08-19 MED ORDER — VERAPAMIL HCL 2.5 MG/ML IV SOLN
INTRAVENOUS | Status: AC | PRN
  Administered 2021-08-19: 5 mg via INTRA_ARTERIAL

## 2021-08-19 MED ORDER — VERAPAMIL HCL 2.5 MG/ML IV SOLN
INTRAVENOUS | Status: AC
  Filled 2021-08-19: qty 2

## 2021-08-19 MED ORDER — MIDAZOLAM HCL 2 MG/2ML IJ SOLN
INTRAMUSCULAR | Status: AC | PRN
  Administered 2021-08-19: 1 mg via INTRAVENOUS

## 2021-08-19 MED ORDER — SODIUM CHLORIDE 0.9 % IV SOLN: INTRAVENOUS | Status: DC

## 2021-08-19 MED ORDER — IOHEXOL 300 MG/ML  SOLN
100.0000 mL | Freq: Once | INTRAMUSCULAR | Status: AC | PRN
Start: 2021-08-19 — End: 2021-08-19
  Administered 2021-08-19: 75 mL via INTRA_ARTERIAL

## 2021-08-19 MED ORDER — HEPARIN SODIUM (PORCINE) 1000 UNIT/ML IJ SOLN
INTRAMUSCULAR | Status: AC | PRN
  Administered 2021-08-19: 5000 [IU] via INTRAVENOUS

## 2021-08-19 MED ORDER — HEPARIN SODIUM (PORCINE) 1000 UNIT/ML IJ SOLN
INTRAMUSCULAR | Status: AC
  Filled 2021-08-19: qty 10

## 2021-08-19 MED ORDER — NITROGLYCERIN 1 MG/10 ML FOR IR/CATH LAB
INTRA_ARTERIAL | Status: AC | PRN
  Administered 2021-08-19: 200 ug via INTRA_ARTERIAL

## 2021-08-19 MED ORDER — NITROGLYCERIN 1 MG/10 ML FOR IR/CATH LAB
INTRA_ARTERIAL | Status: AC
  Filled 2021-08-19: qty 10

## 2021-08-19 NOTE — Sedation Documentation (Signed)
Patient transported to short stay. Anisha RN at the bedside to receive patient. TR band in place right wrist. Site is clean, dry and intact. No hematoma noted. Palpable +2 radial pulse

## 2021-08-19 NOTE — Progress Notes (Signed)
Patient complaining of slight headache and requesting Tylenol. Dr. Norma Fredrickson, MD notified and gave verbal order for Tylenol 500 mg PO once. Orders placed and followed. Will continue to monitor.

## 2021-08-19 NOTE — Sedation Documentation (Signed)
Distal radial pulse +2

## 2021-08-19 NOTE — Procedures (Signed)
INTERVENTIONAL NEURORADIOLOGY BRIEF POSTPROCEDURE NOTE  DIAGNOSTIC CEREBRAL ANGIOGRAM   Attending: Dr. Erven Colla de Sindy Messing  Assistant: None.   Diagnosis: Left ICA terminus aneurysm status post treatment.   Access site: Right radial artery.   Access closure: TR band.   Anesthesia: Moderate sedation.   Medication used: 1 mg Versed IV; 25 mcg Fentanyl IV.  Complications: None.   Estimated blood loss: Negligible.   Specimen: None.   Findings: Status post treatment of a left ICA terminus aneurysm with a web device.  The web device is in expected position filling the treated aneurysm.  No evidence of migration.  No evidence of clot formation around the device.  No unexpected findings.   The patient tolerated the procedure well without incident or complication and is in stable condition.

## 2021-08-20 ENCOUNTER — Other Ambulatory Visit (HOSPITAL_COMMUNITY): Payer: Self-pay

## 2021-08-20 ENCOUNTER — Other Ambulatory Visit (HOSPITAL_COMMUNITY): Payer: Self-pay | Admitting: Neuroradiology

## 2021-08-20 ENCOUNTER — Telehealth (HOSPITAL_COMMUNITY): Payer: Self-pay | Admitting: Pharmacist

## 2021-08-20 DIAGNOSIS — I671 Cerebral aneurysm, nonruptured: Secondary | ICD-10-CM

## 2021-08-20 NOTE — Telephone Encounter (Signed)
Transitions of Care Pharmacy   Call attempted for a pharmacy transitions of care follow-up. HIPAA appropriate voicemail was left with call back information provided.   Call attempt #2. Will follow-up in 1-3 days.

## 2021-08-21 ENCOUNTER — Encounter: Payer: BC Managed Care – PPO | Admitting: Physical Medicine & Rehabilitation

## 2021-08-21 ENCOUNTER — Encounter: Payer: Self-pay | Admitting: Physical Medicine & Rehabilitation

## 2021-08-21 ENCOUNTER — Other Ambulatory Visit: Payer: Self-pay

## 2021-08-21 VITALS — BP 130/80 | HR 77 | Ht 66.0 in | Wt 236.0 lb

## 2021-08-21 DIAGNOSIS — I671 Cerebral aneurysm, nonruptured: Secondary | ICD-10-CM | POA: Diagnosis not present

## 2021-08-21 NOTE — Progress Notes (Signed)
Subjective:    Patient ID: Shannon Oliver, female    DOB: 08/21/1960, 61 y.o.   MRN: [id number] 61 y.o. right-handed female with history of diabetes mellitus hypertension hyperlipidemia obesity with BMI 37.28, OSA, question medical compliance.  Per chart review lives with spouse.  Independent prior to admission working as a Training and development officer at [employer].  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  Post procedure left side decreased sensation as well as left-sided weakness.  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  No associated hemorrhage or mass-effect.  CT angiogram head and neck no intracranial large vessel occlusion or significant stenosis.  Echocardiogram with ejection fraction of 65 to 70% no wall motion abnormalities grade 1 diastolic dysfunction.  Currently maintained on low-dose aspirin.  She did initially need Cleviprex for blood pressure control.  Therapy evaluations completed due to patient decreased functional mobility left side weakness was admitted for a comprehensive rehab program.   Admit date: 07/23/2021 Discharge date: 08/01/2021  HPI 61 year old female who is following up after admission to stroke rehab unit at [facility].  She has history of left ICA aneurysm and has undergone embolization approximately 1 month ago.  She had follow-up angiogram 2 days ago per IR and coil position evaluated and was in proper position. Able to write with Right hand Going to gym everyday  Started driving with husband and now driving independently without issues. She is totally independent with all self-care and mobility including shopping and housework as well as cooking. She works as a Training and development officer 32 hours/week and would  like to return to this.  She states that she sometimes has to carry hot trays but she can regulate the weight of this independently Patient has been evaluated by physical therapy and felt to be a low fall risk Pain Inventory Average Pain 4 Pain Right Now 0 My pain is intermittent, dull, and aching  LOCATION OF PAIN  right hip  BOWEL Number of stools per week: 7 Oral laxative use Yes  Type of laxative OTC   BLADDER Normal and Pads In and out cath, frequency N/A Able to self cath No  Bladder incontinence Yes  Frequent urination Yes  Leakage with coughing Yes  Difficulty starting stream No  Incomplete bladder emptying No    Mobility walk without assistance how many minutes can you walk? Unknown ability to climb steps?  yes do you drive?  yes Do you have any goals in this area?  yes  Function employed # of hrs/week On medical leave as [employer]ie [employer] you have any goals in this area?  yes  Neuro/Psych bladder control problems  Prior Studies Any changes since last visit?  yes Angiogram at [facility]  Physicians involved in your care Any changes since last visit?  no   Family History  Problem Relation Age of Onset   Cancer Mother    Hypertension Mother    Diabetes Mother    Breast cancer Maternal Aunt    Breast cancer Cousin    Social History   Socioeconomic History   Marital status: Married    Spouse name: Not on file   Number of children: Not on file   Years of education: Not on file   Highest education level: Not on file  Occupational History   Not on file  Tobacco Use   Smoking status: Never   Smokeless tobacco: Never  Vaping Use   Vaping Use: Never used  Substance and Sexual Activity   Alcohol use: Not Currently    Comment: occ   Drug use: No   Sexual activity: Not on file  Other Topics Concern   Not on file  Social History Narrative   ** Merged History Encounter **       Social Determinants of Health   Financial Resource Strain: Not on file  Food  Insecurity: Not on file  Transportation Needs: Not on file  Physical Activity: Not on file  Stress: Not on file  Social Connections: Not on file   Past Surgical History:  Procedure Laterality Date   CARDIAC CATHETERIZATION     CATARACT EXTRACTION Left 06/04/2021   COLONOSCOPY WITH PROPOFOL N/A 04/29/2015   Procedure: COLONOSCOPY WITH PROPOFOL;  Surgeon: Garlan Fair, MD;  Location: [facility];  Service: Endoscopy;  Laterality: N/A;   EYE SURGERY     HYSTEROSCOPY WITH D & C N/A 09/07/2017   Procedure: DILATATION AND CURETTAGE /HYSTEROSCOPY;  Surgeon: Sloan Leiter, MD;  Location: [facility];  Service: Gynecology;  Laterality: N/A;   IR ANGIO INTRA EXTRACRAN SEL COM CAROTID INNOMINATE UNI R MOD SED  06/18/2021   IR ANGIO INTRA EXTRACRAN SEL INTERNAL CAROTID BILAT MOD SED  08/19/2021   IR ANGIO INTRA EXTRACRAN SEL INTERNAL CAROTID UNI L MOD SED  06/18/2021   IR ANGIO INTRA EXTRACRAN SEL INTERNAL CAROTID UNI R MOD SED  07/20/2021   IR ANGIO VERTEBRAL SEL VERTEBRAL UNI R MOD SED  06/18/2021   IR ANGIO VERTEBRAL SEL VERTEBRAL UNI R MOD SED  08/19/2021   IR ANGIOGRAM FOLLOW UP STUDY  07/20/2021   IR CT HEAD LTD  07/20/2021   IR RADIOLOGIST EVAL & MGMT  06/19/2021   IR TRANSCATH/EMBOLIZ  07/20/2021   IR US GUIDE VASC ACCESS RIGHT  06/18/2021   IR US GUIDE VASC ACCESS RIGHT  08/19/2021   KNEE ARTHROSCOPY W/ MENISCAL REPAIR Right    RADIOLOGY WITH ANESTHESIA N/A 07/20/2021   Procedure: IR WITH ANESTHESIA;  Surgeon: Pedro Earls, MD;  Location: [facility];  Service: Radiology;  Laterality: N/A;   Past Medical History:  Diagnosis Date   Abnormal EKG    LVH with strain   Acid reflux    Asthma    Depression    Diabetes mellitus    A1C over 9   HLD (hyperlipidemia)    Hypertension    Noncompliance    Obesity    Pneumonia    Sleep apnea    BP 130/80    Pulse 77    Ht 5\' 6"  (1.676 m)    Wt 236 lb (107 kg)    LMP 08/02/2013 Comment: spotting   SpO2 99%    BMI  38.09 kg/m   Opioid Risk Score:   Fall Risk Score:  `1  Depression screen PHQ 2/9  Depression screen [facility] 2/9 08/10/2021 09/04/2018 07/25/2018 07/18/2017  Decreased Interest 0 1 1 2   Down, Depressed, Hopeless 2 1 1 2   PHQ - 2 Score 2 2 2 4   Altered sleeping 0 3 - 3  Tired, decreased energy 0 3 - 2  Change in appetite 0 2 - 3  Feeling bad or failure about yourself  0 1 - 3  Trouble concentrating 0 2 - -  Moving slowly or fidgety/restless 0  0 - 0  Suicidal thoughts 0 0 0 1  PHQ-9 Score 2 13 - 16  Difficult doing work/chores Not difficult at all - - -    Review of Systems  Genitourinary:  Positive for frequency. Negative for vaginal discharge and vaginal pain.  Musculoskeletal:        Hip pain  All other systems reviewed and are negative.     Objective:   Physical Exam Vitals and nursing note reviewed.  Constitutional:      Appearance: She is obese.  HENT:     Head: Normocephalic and atraumatic.  Eyes:     Extraocular Movements: Extraocular movements intact.     Conjunctiva/sclera: Conjunctivae normal.     Pupils: Pupils are equal, round, and reactive to light.  Skin:    General: Skin is warm and dry.  Neurological:     Mental Status: She is alert and oriented to person, place, and time.     Comments: Motor strength is 5/5 bilateral deltoid, bicep, tricep, grip, hip flexor, knee extensor, ankle dorsiflexor and plantar flexor Sensation intact light touch bilateral upper and lower limb There is no evidence of dysdiadochokinesis with rapid alternating supination pronation bilateral upper extremities. Finger thumb opposition shows normal fine motor movements bilaterally. Ambulates without assistive device no evidence of toe drag or knee instability.  She is able to toe walk as well as heel walk She has mild difficulty with tandem gait  Psychiatric:        Mood and Affect: Mood normal.        Behavior: Behavior normal.          Assessment & Plan:   1.  History of right  ICA aneurysm no residual neurologic deficits at this time with the exception of mild truncal ataxia which interferes with tandem gait. After discussing work activities with the patient do think she can return without restrictions. She will need to follow-up with interventional radiology as well as primary care.  I will see the patient back on a as needed basis Letter for work was written.

## 2021-08-24 ENCOUNTER — Other Ambulatory Visit (HOSPITAL_COMMUNITY): Payer: Self-pay

## 2021-08-24 ENCOUNTER — Telehealth (HOSPITAL_COMMUNITY): Payer: Self-pay

## 2021-08-24 NOTE — Telephone Encounter (Signed)
Transitions of Care Pharmacy   Call attempted for a pharmacy transitions of care follow-up. HIPAA appropriate voicemail was left with call back information provided.   Call attempt #3. Will no longer be contacting patient.

## 2021-08-28 ENCOUNTER — Telehealth: Payer: Self-pay | Admitting: Physical Therapy

## 2021-08-28 ENCOUNTER — Ambulatory Visit: Payer: BC Managed Care – PPO | Admitting: Physical Therapy

## 2021-08-28 ENCOUNTER — Other Ambulatory Visit: Payer: Self-pay

## 2021-08-28 ENCOUNTER — Other Ambulatory Visit (INDEPENDENT_AMBULATORY_CARE_PROVIDER_SITE_OTHER): Payer: BC Managed Care – PPO

## 2021-08-28 DIAGNOSIS — E1165 Type 2 diabetes mellitus with hyperglycemia: Secondary | ICD-10-CM

## 2021-08-28 DIAGNOSIS — Z794 Long term (current) use of insulin: Secondary | ICD-10-CM | POA: Diagnosis not present

## 2021-08-28 NOTE — Addendum Note (Signed)
Addended by: Kaylyn Lim I on: 08/28/2021 03:40 PM   Modules accepted: Orders

## 2021-08-28 NOTE — Telephone Encounter (Signed)
Called patient about missed PT appointment this morning. Left date and time of next appointment and number to call if patient is unable to make her next appointment.   Shannon Oliver, PTA, [place] [address], [place], [place] [address] [phone] 08/28/21, 9:05 AM

## 2021-08-29 LAB — FRUCTOSAMINE: Fructosamine: 305 umol/L — ABNORMAL HIGH (ref 0–285)

## 2021-08-29 LAB — GLUCOSE, RANDOM: Glucose: 122 mg/dL — ABNORMAL HIGH (ref 70–99)

## 2021-08-31 ENCOUNTER — Ambulatory Visit: Payer: BC Managed Care – PPO | Admitting: Endocrinology

## 2021-09-04 ENCOUNTER — Ambulatory Visit: Payer: BC Managed Care – PPO | Admitting: Physical Therapy

## 2021-09-11 ENCOUNTER — Ambulatory Visit: Payer: BC Managed Care – PPO | Admitting: Physical Therapy

## 2021-09-17 ENCOUNTER — Other Ambulatory Visit: Payer: Self-pay

## 2021-09-17 ENCOUNTER — Ambulatory Visit
Admission: RE | Admit: 2021-09-17 | Discharge: 2021-09-17 | Disposition: A | Discharge status: Home / Self Care | Payer: BC Managed Care – PPO | Source: Ambulatory Visit | Attending: Internal Medicine | Admitting: Internal Medicine

## 2021-09-17 ENCOUNTER — Other Ambulatory Visit: Payer: Self-pay | Admitting: Internal Medicine

## 2021-09-17 IMAGING — CR DG HIP (WITH OR WITHOUT PELVIS) 3-4V BILAT
3 series · 3 of 3 positions shown · non-contrast
Comparison: None.

CLINICAL DATA: Bilateral hip pain.

EXAM:
DG HIP (WITH OR WITHOUT PELVIS) 3-4V BILAT

[t pelvis a.p.]
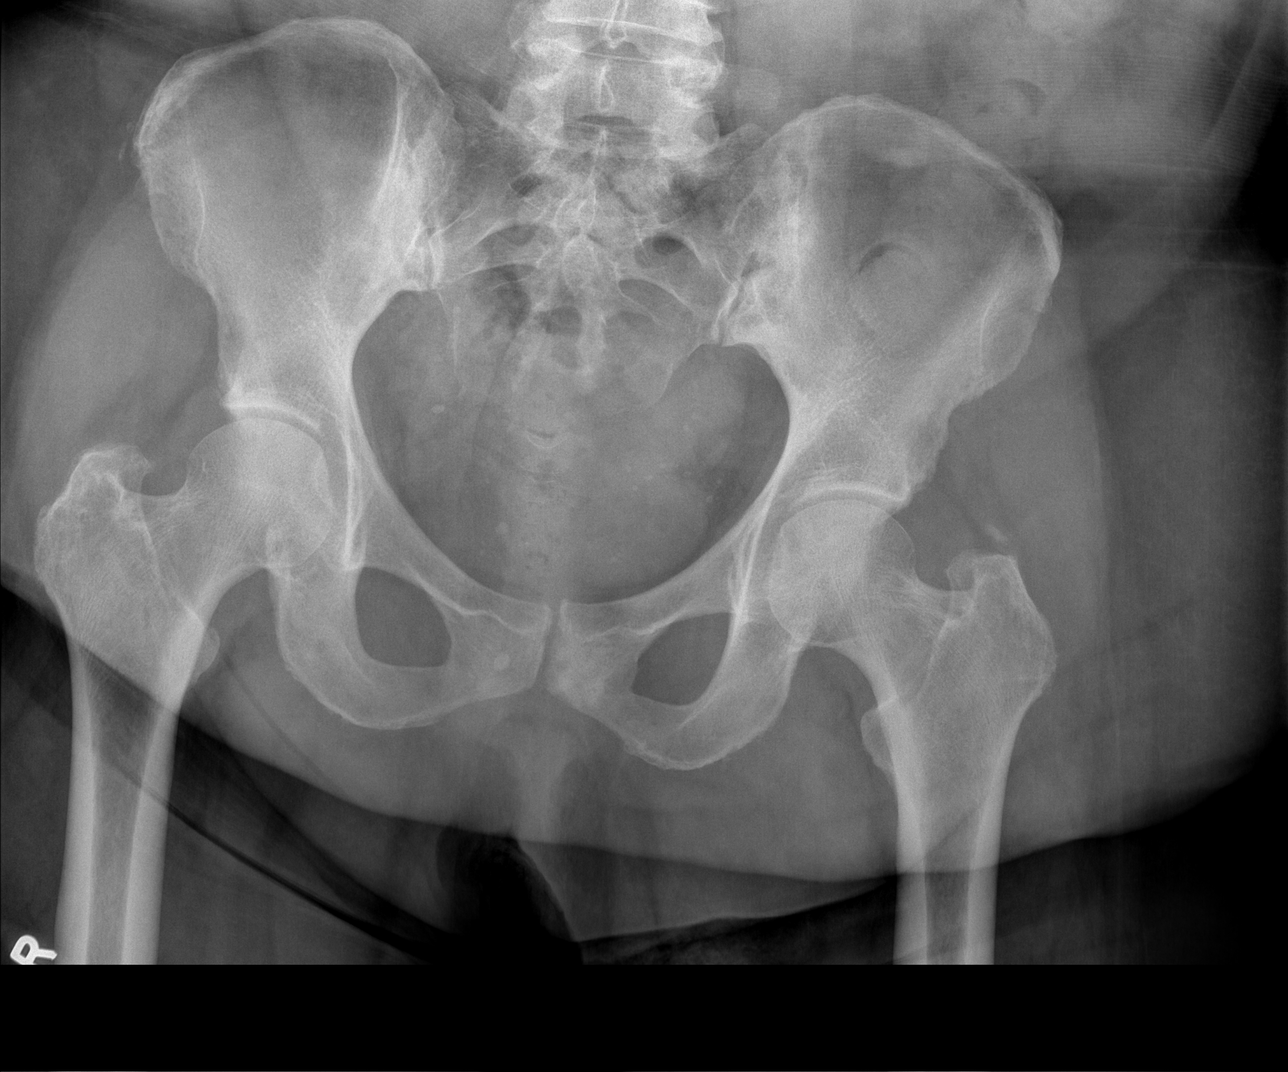

[t hip frog leg left]
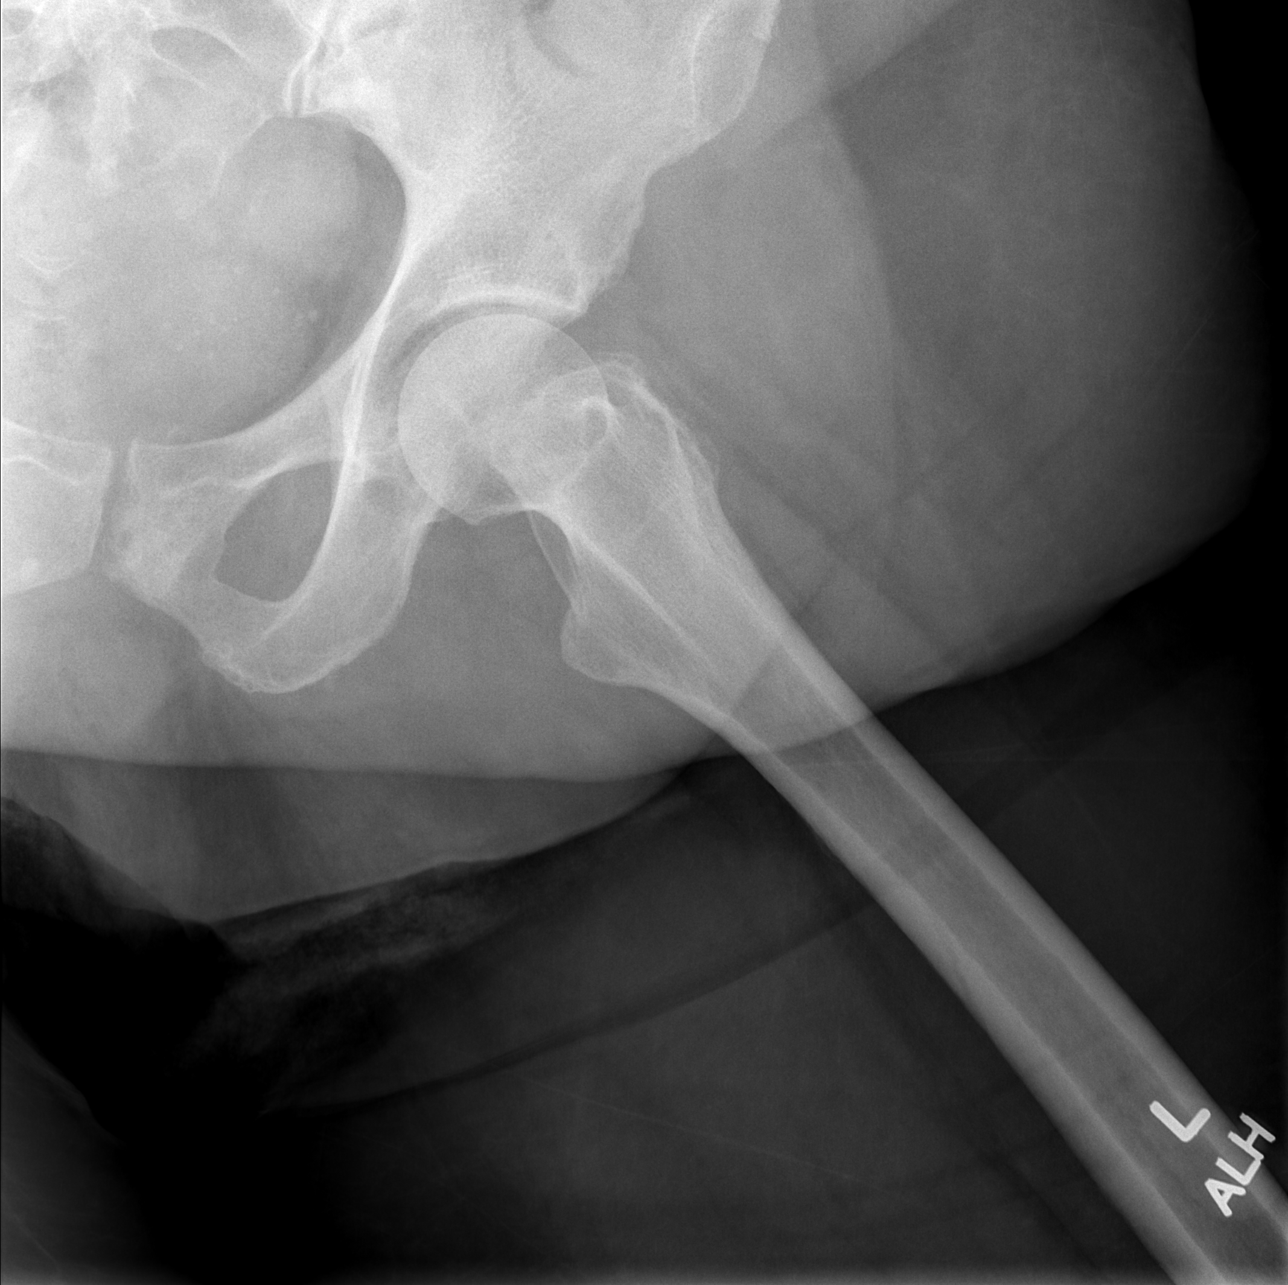

[t hip frog leg right]
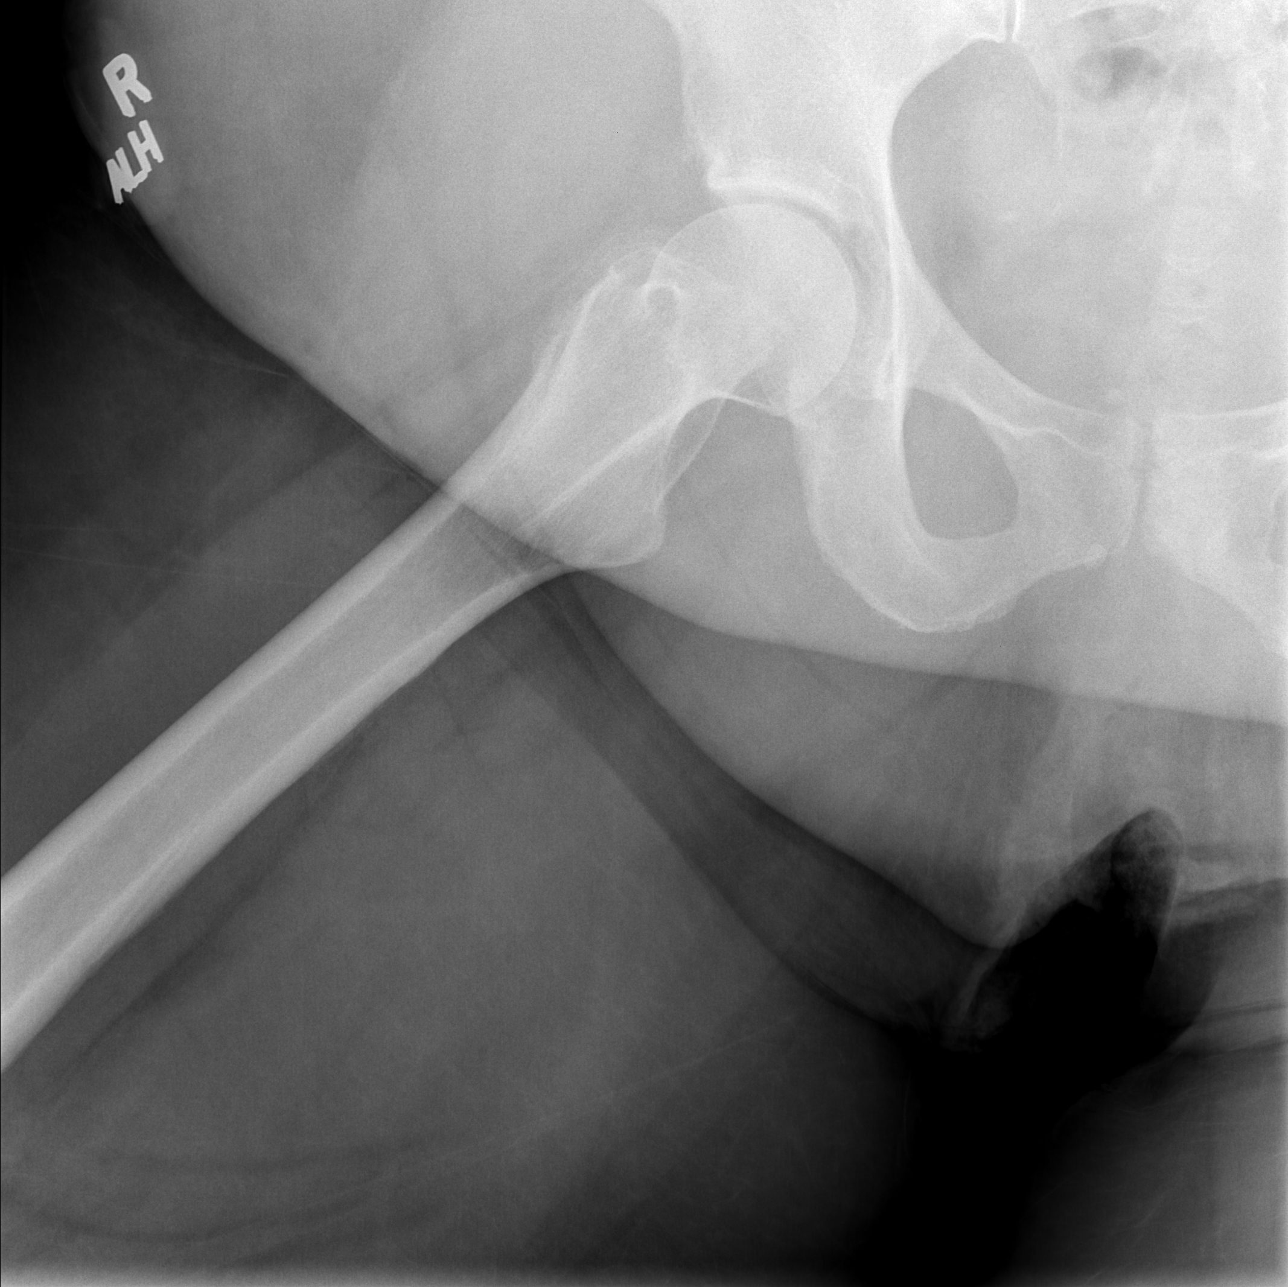

[3 of 3 positions shown; findings below may reference images not displayed]

FINDINGS: Mild bilateral femoroacetabular joint space narrowing. Mild
bilateral sacroiliac subchondral sclerosis degenerative change.
Moderate pubic symphysis joint space narrowing with
left-greater-than-right subchondral sclerosis and cystic change. No
acute fracture or dislocation.
IMPRESSION: Mild-to-moderate pubic symphysis, mild bilateral femoroacetabular,
and mild bilateral sacroiliac osteoarthritis.

## 2021-09-17 IMAGING — CR DG HAND COMPLETE 3+V*R*
3 series · 3 of 3 positions shown · non-contrast
Comparison: None.

CLINICAL DATA: Bilateral hand and wrist pain and soreness since
stroke in [REDACTED]. Arthritis.

EXAM:
RIGHT HAND - COMPLETE 3+ VIEW; LEFT HAND - COMPLETE 3+ VIEW

[x hand pa right]
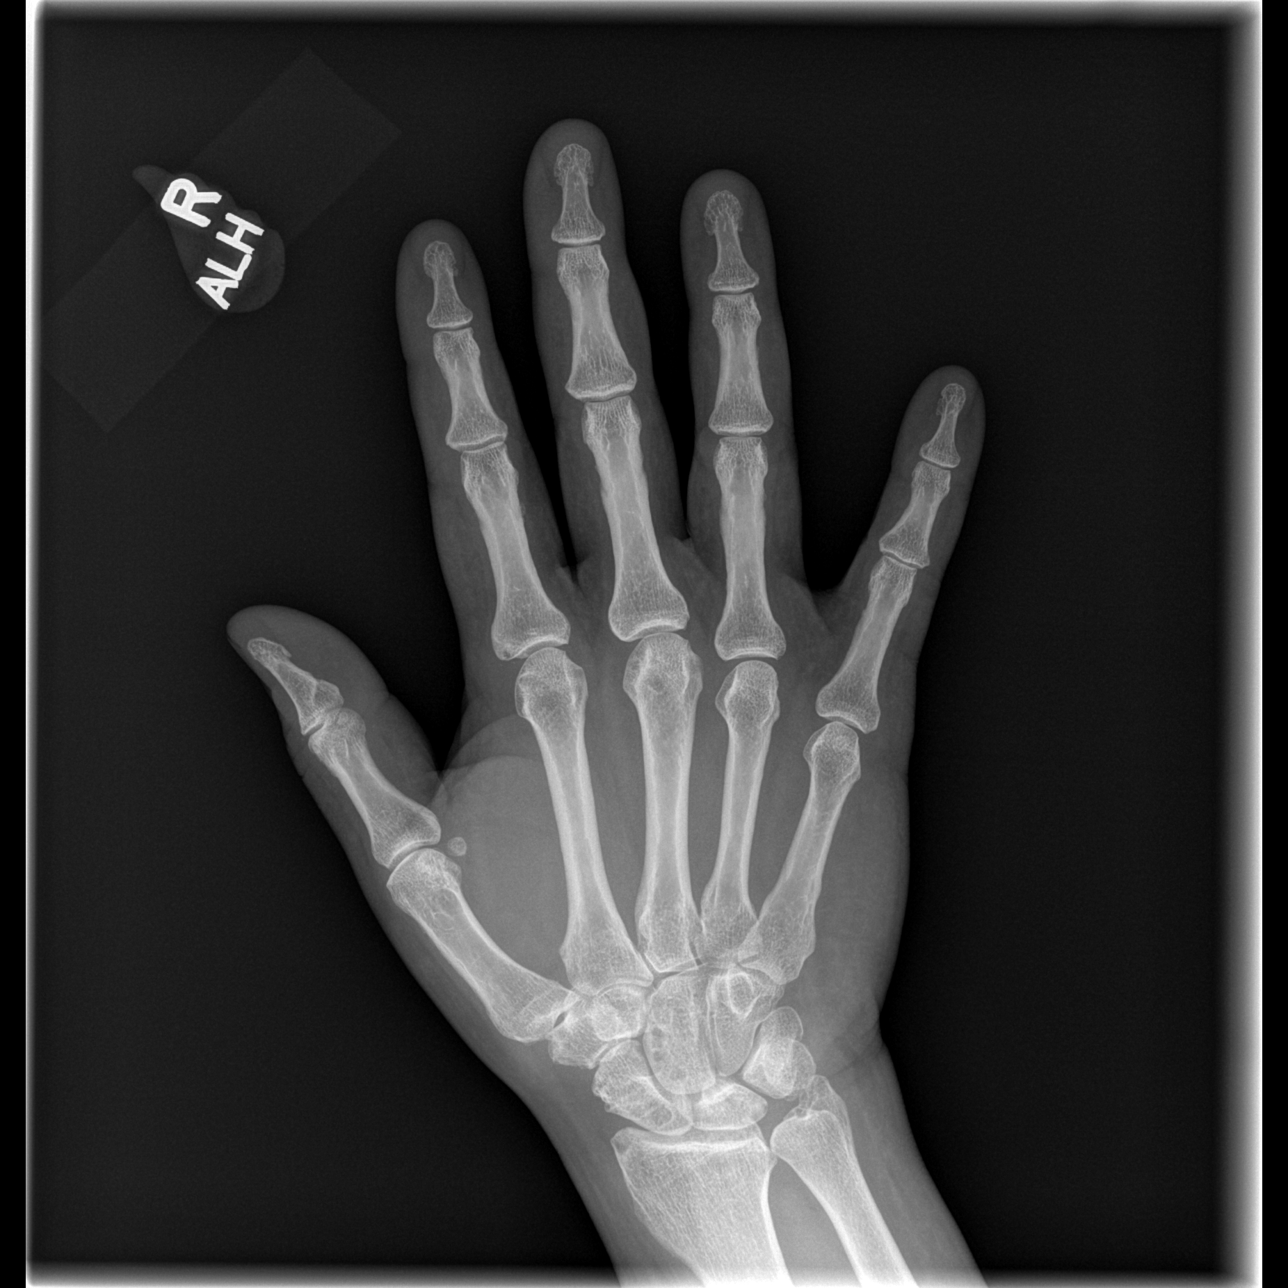

[x hand oblique right]
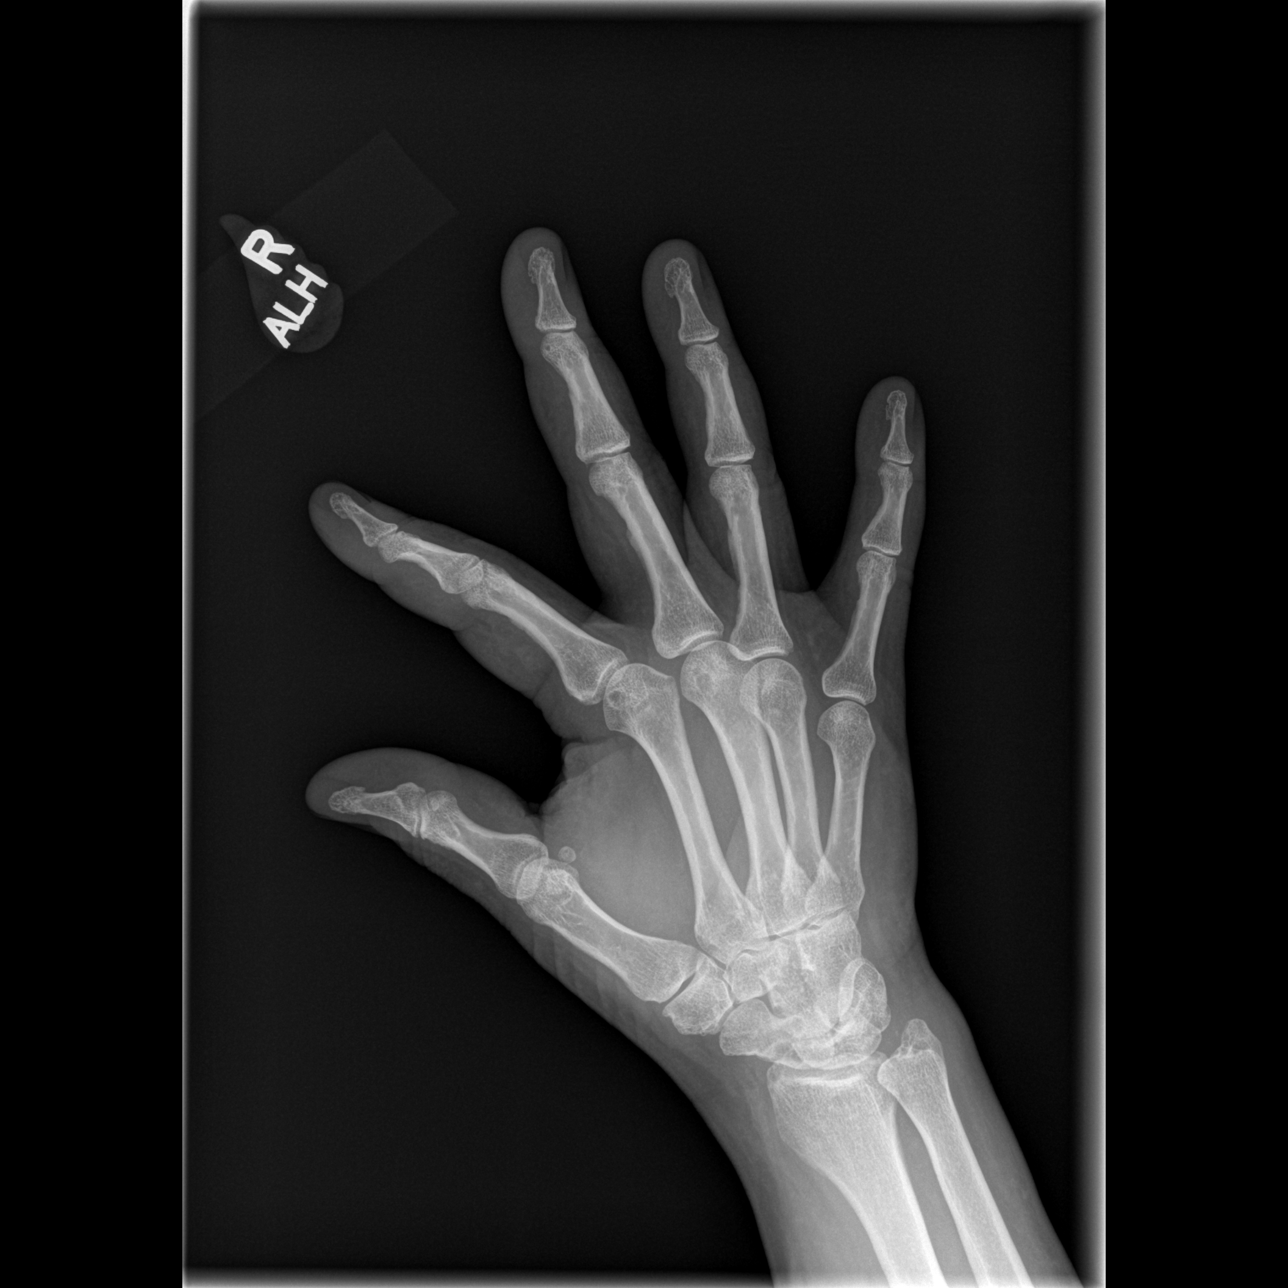

[x hand lat right]
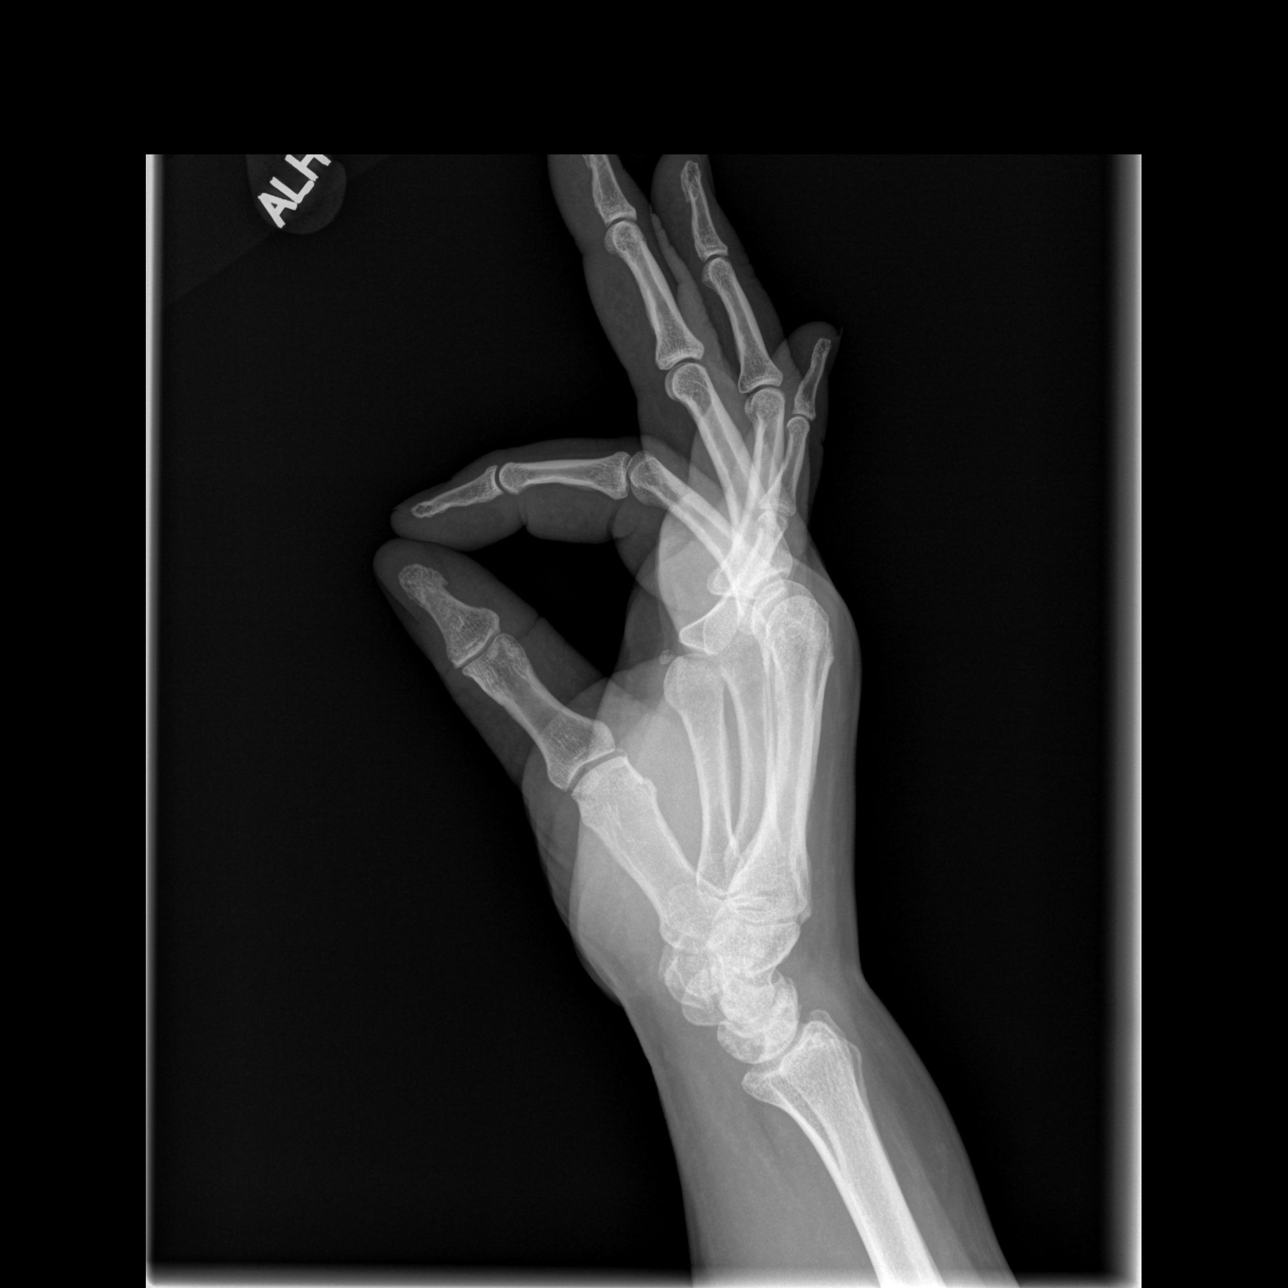

[3 of 3 positions shown; findings below may reference images not displayed]

FINDINGS: Right hand:

Moderate triscaphe joint and thumb carpometacarpal joint space
narrowing, subchondral sclerosis and peripheral osteophytosis. Mild
joint space narrowing of the thumb interphalangeal joint and second
and third metacarpophalangeal joints. Moderate likely degenerative
cystic changes at the scaphoid-capitate articulation.

Left hand:

Moderate thumb carpometacarpal greater than triscaphe joint space
narrowing, subchondral sclerosis and peripheral osteophytosis
degenerative change. Mild cystic changes at the scaphoid-capitate
articulation. Mild joint space narrowing of the thumb
interphalangeal joint and second and third metacarpophalangeal
joints.

No acute fracture or dislocation. No cortical erosion or
periostitis.
IMPRESSION: Arthritis in a distribution suggesting osteoarthritis, greatest
within the thumb carpometacarpal and triscaphe joints.

## 2021-09-17 IMAGING — CR DG HAND COMPLETE 3+V*L*
3 series · 3 of 3 positions shown · non-contrast
Comparison: None.

CLINICAL DATA: Bilateral hand and wrist pain and soreness since
stroke in [REDACTED]. Arthritis.

EXAM:
RIGHT HAND - COMPLETE 3+ VIEW; LEFT HAND - COMPLETE 3+ VIEW

[x hand pa left]
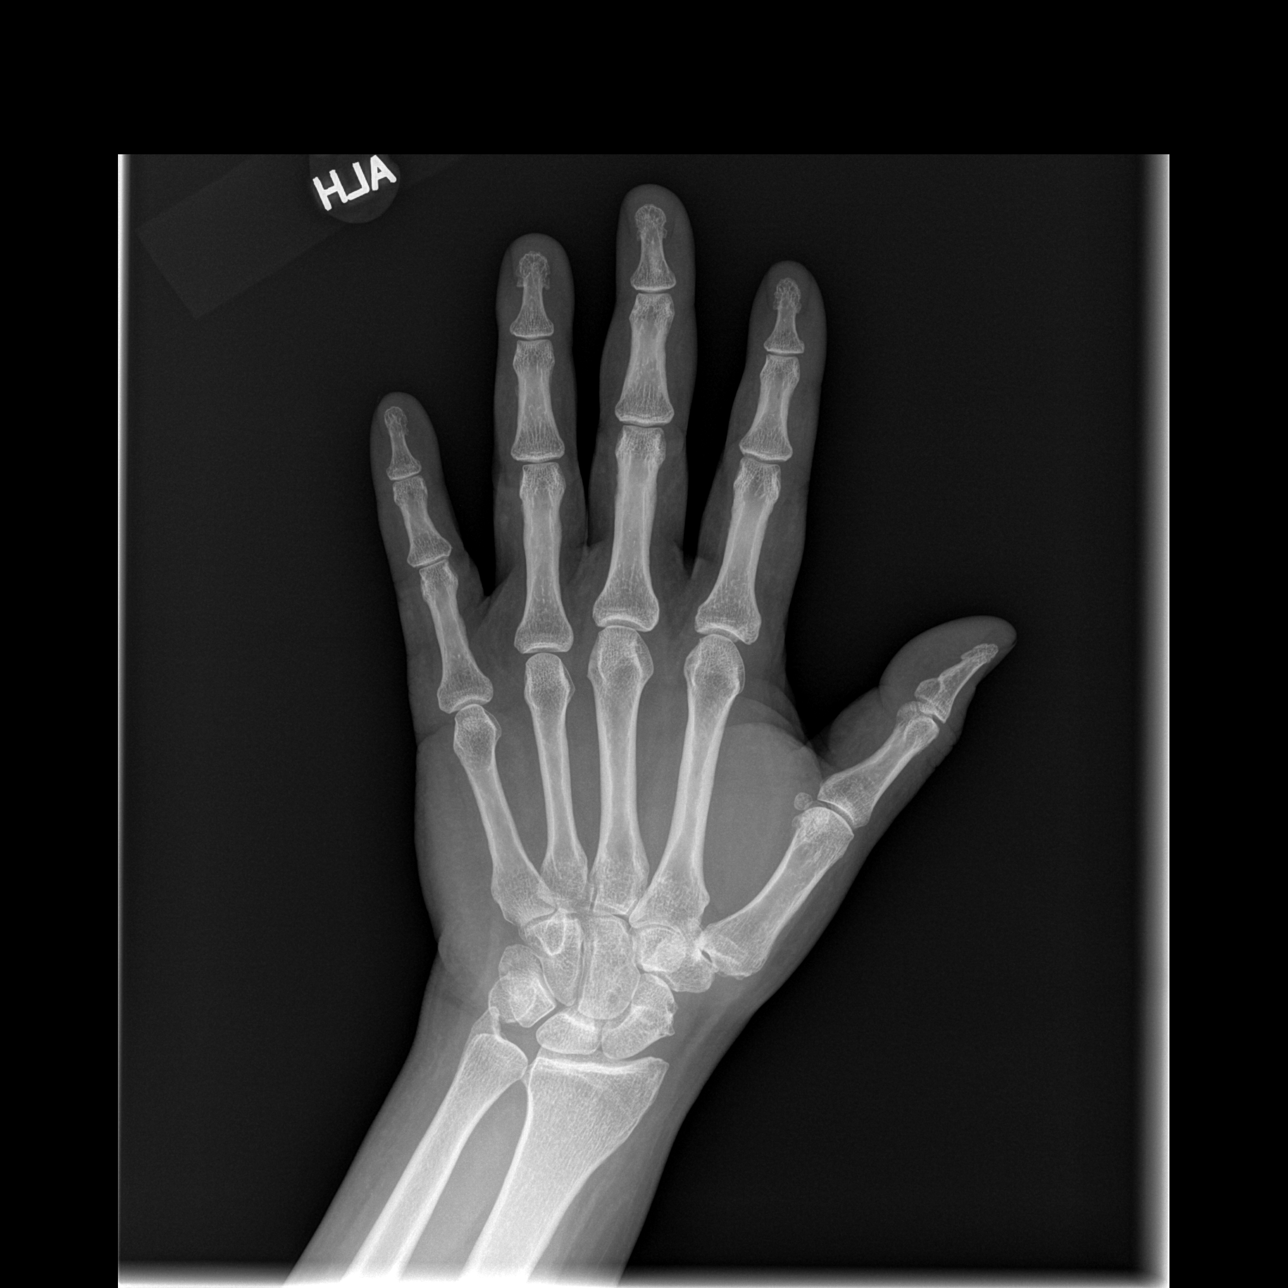

[x hand oblique left]
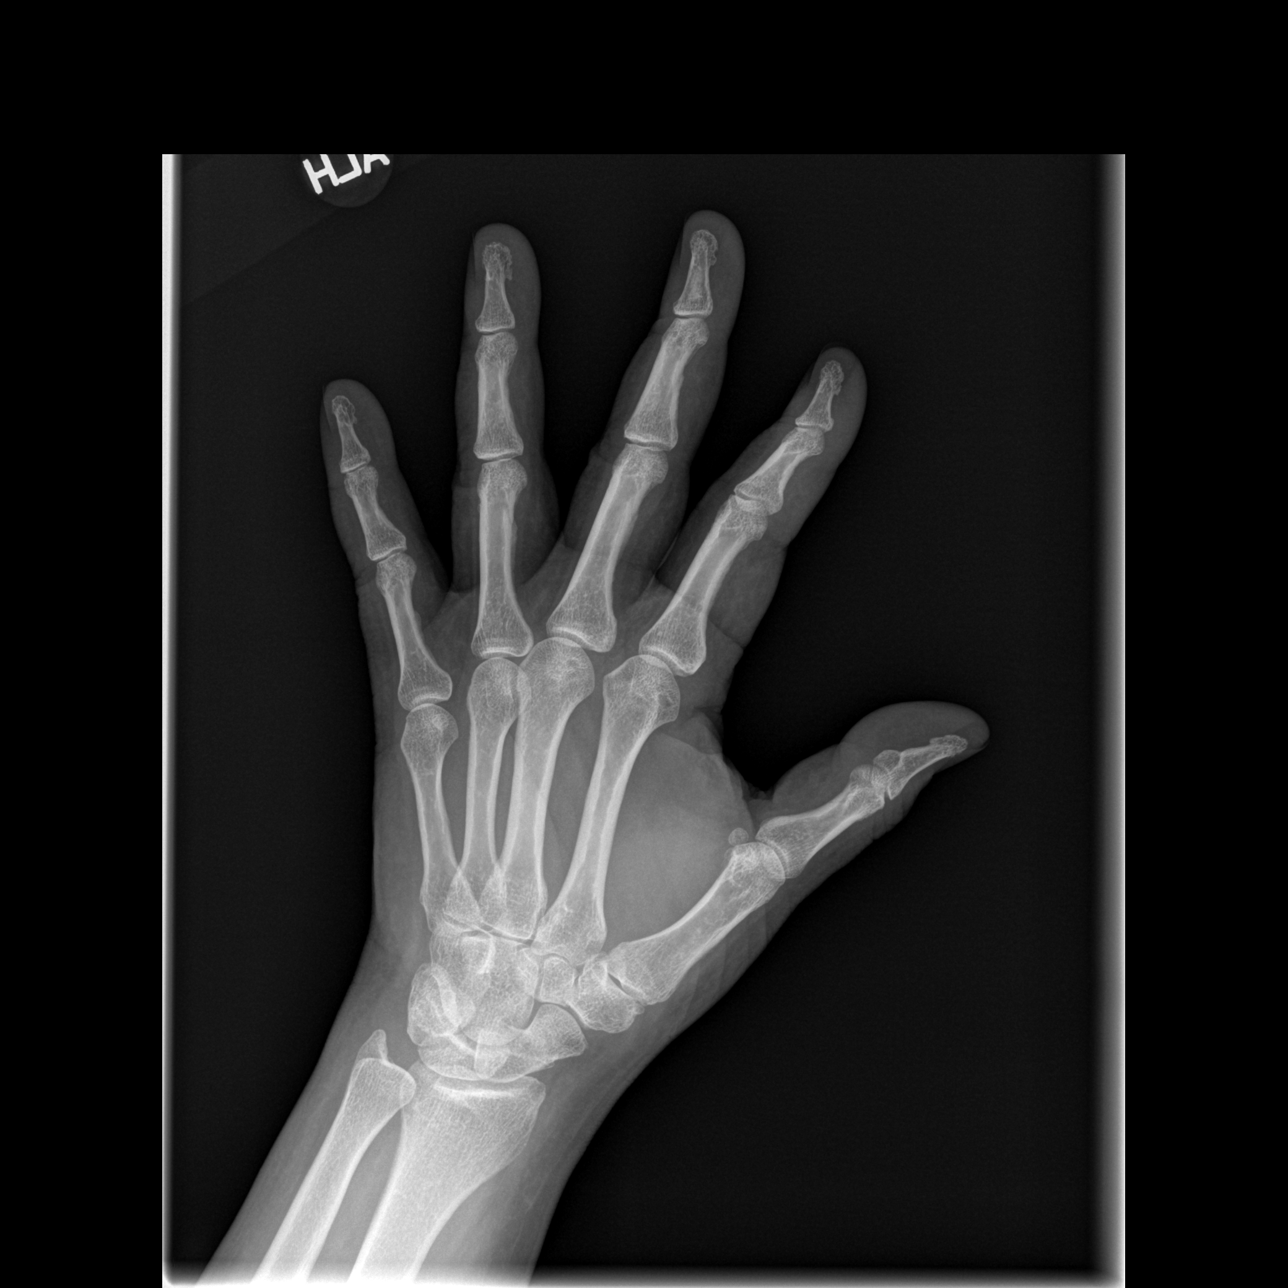

[x hand lat left]
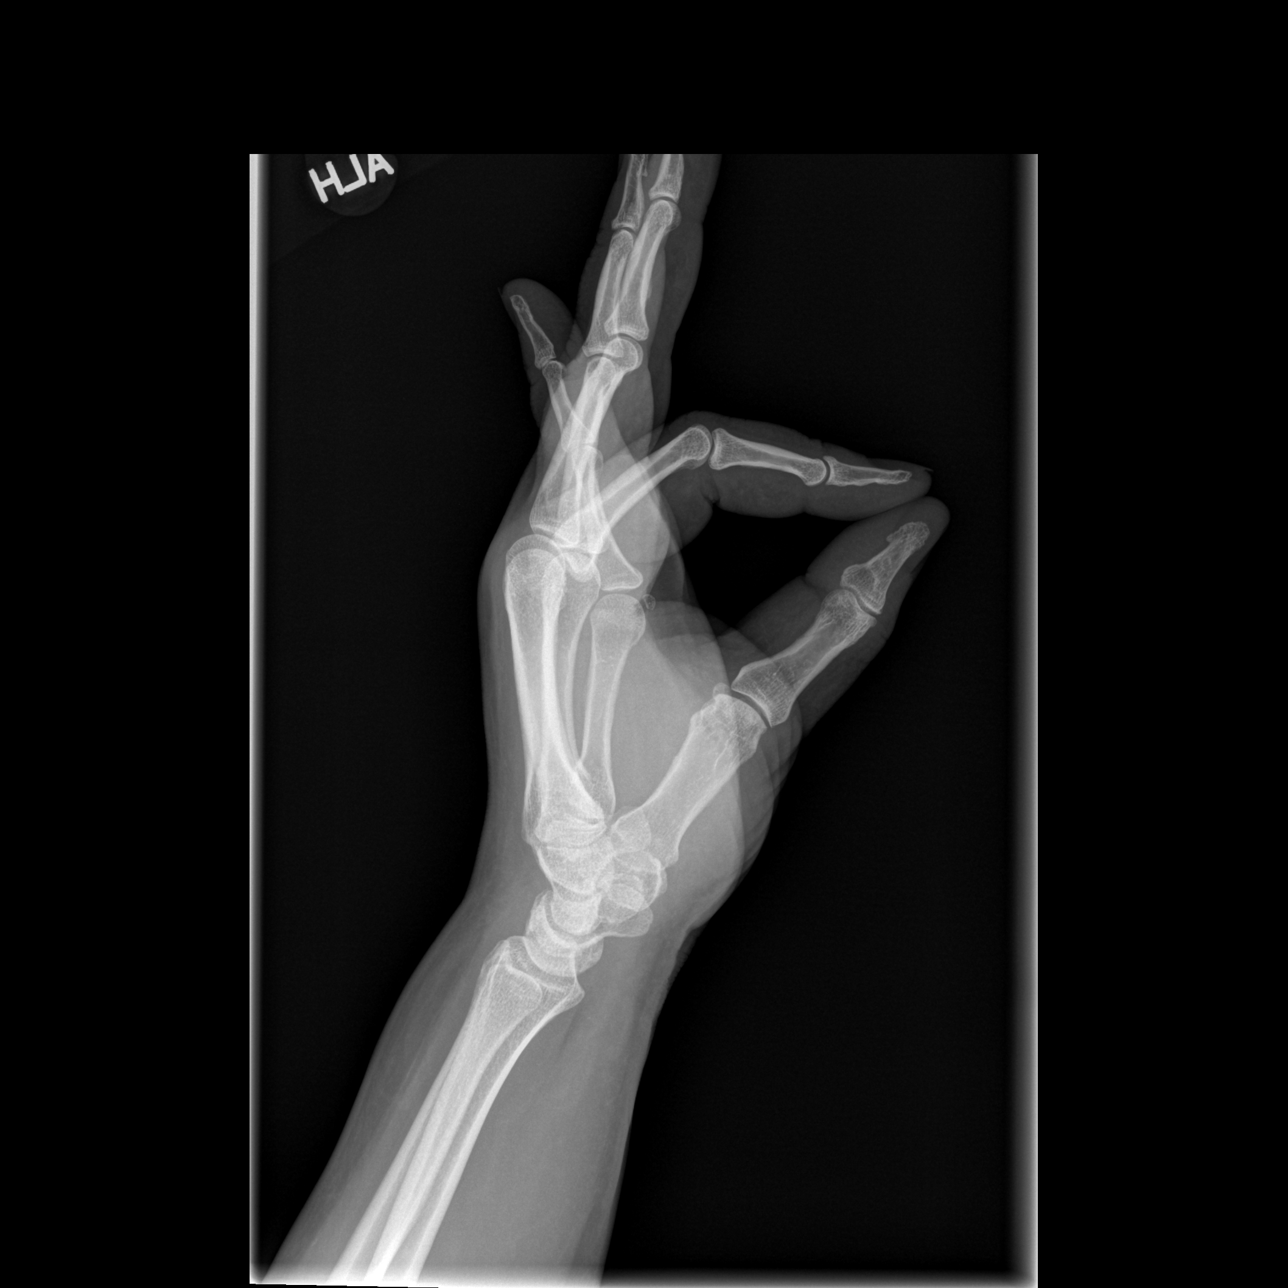

[3 of 3 positions shown; findings below may reference images not displayed]

FINDINGS: Right hand:

Moderate triscaphe joint and thumb carpometacarpal joint space
narrowing, subchondral sclerosis and peripheral osteophytosis. Mild
joint space narrowing of the thumb interphalangeal joint and second
and third metacarpophalangeal joints. Moderate likely degenerative
cystic changes at the scaphoid-capitate articulation.

Left hand:

Moderate thumb carpometacarpal greater than triscaphe joint space
narrowing, subchondral sclerosis and peripheral osteophytosis
degenerative change. Mild cystic changes at the scaphoid-capitate
articulation. Mild joint space narrowing of the thumb
interphalangeal joint and second and third metacarpophalangeal
joints.

No acute fracture or dislocation. No cortical erosion or
periostitis.
IMPRESSION: Arthritis in a distribution suggesting osteoarthritis, greatest
within the thumb carpometacarpal and triscaphe joints.

## 2021-09-18 ENCOUNTER — Ambulatory Visit: Payer: BC Managed Care – PPO

## 2021-09-21 ENCOUNTER — Other Ambulatory Visit: Payer: Self-pay | Admitting: Endocrinology

## 2021-09-27 NOTE — Progress Notes (Deleted)
? ?NEUROLOGY CONSULTATION NOTE ? ?Shannon Oliver ?MRN: [id number] ?DOB: 06-May-1961 ? ?Referring provider: Godfrey Pick, MD ?Primary care provider: Leeroy Cha, MD ? ?Reason for consult:  stroke, headache, cerebral aneurysm ? ?Assessment/Plan:  ? 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 ?1  Secondary stroke prevention as managed by PCP: ? ASA '81mg'$  daily ? Statin.  LDL goal less than 70 ? Glycemic control.  Hgb A1c goal less than 7 ? Normotensive blood pressure ?2  Follow up with interventional radiology regarding aneurysm.   ? ? ?Subjective:  ?Shannon Oliver is a 61 year old female with HTN, HLD, DM 2, and OSA who presents for headache, stroke and cerebral aneurysm.  History supplemented by hospital notes. ? ?On 07/20/2021, patient underwent right radical approach selective cerebral angiogram for evaluation of 68m left ICA aneurysm embolization.  Following the procedure, she was noted to have left sided sensory loss and left sided upper and lower extremity weakness.  She received TNKase with improvement of her left sided weakness.  She did develop a hematoma in the right hand at the puncture site.  CTA of head and neck showed coiling of left ICA aneurysm and 3 mm right ICA aneurysm but no LVO or significant stenosis.  MRI of brain showed scattered small and punctate infarcts in the bilateral cerebral hemispheres with no hemorrhage or mass effect as well as chronic small vessel ischemic changes.  2D echo showed LVEF 65-70% with no atrial level shunt or other cardiac source of embol.  LDL was 69 and Hgb A1c was 8.4.  She was discharged to inpatient rehab and ***. ? ?*** ?  ?Current medications:  ASA '81mg'$  daily, rosuvastatin, carvedilol, amlodipine, losartan, spironolactone, metformin, insulin ? ?PAST MEDICAL HISTORY: ?Past Medical History:  ?Diagnosis Date   ? Abnormal EKG   ? LVH with strain  ? Acid reflux   ? Asthma   ? Depression   ? Diabetes mellitus   ? A1C over 9  ? HLD (hyperlipidemia)   ? Hypertension   ? Noncompliance   ? Obesity   ? Pneumonia   ? Sleep apnea   ? ? ?PAST SURGICAL HISTORY: ?Past Surgical History:  ?Procedure Laterality Date  ? CARDIAC CATHETERIZATION    ? CATARACT EXTRACTION Left 06/04/2021  ? COLONOSCOPY WITH PROPOFOL N/A 04/29/2015  ? Procedure: COLONOSCOPY WITH PROPOFOL;  Surgeon: MGarlan Fair MD;  [facility]: [facility];  Service: Endoscopy;  Laterality: N/A;  ? EYE SURGERY    ? HYSTEROSCOPY WITH D & C N/A 09/07/2017  ? Procedure: DILATATION AND CURETTAGE /HYSTEROSCOPY;  Surgeon: DSloan Leiter MD;  Location: [facility]  Service: Gynecology;  Laterality: N/A;  ? IR ANGIO INTRA EXTRACRAN SEL COM CAROTID INNOMINATE UNI R MOD SED  06/18/2021  ? IR ANGIO INTRA EXTRACRAN SEL INTERNAL CAROTID BILAT MOD SED  08/19/2021  ? IR ANGIO INTRA EXTRACRAN SEL INTERNAL CAROTID UNI L MOD SED  06/18/2021  ? IR ANGIO INTRA EXTRACRAN SEL INTERNAL CAROTID UNI R MOD SED  07/20/2021  ? IR ANGIO VERTEBRAL SEL VERTEBRAL UNI R MOD SED  06/18/2021  ? IR ANGIO VERTEBRAL SEL VERTEBRAL UNI R MOD SED  08/19/2021  ? IR ANGIOGRAM FOLLOW UP STUDY  07/20/2021  ? IR CT HEAD LTD  07/20/2021  ? IR RADIOLOGIST EVAL & MGMT  06/19/2021  ? IR TRANSCATH/EMBOLIZ  07/20/2021  ? IR [place]IDE VASC  ACCESS RIGHT  06/18/2021  ? IR US GUIDE VASC ACCESS RIGHT  08/19/2021  ? KNEE ARTHROSCOPY W/ MENISCAL REPAIR Right   ? RADIOLOGY WITH ANESTHESIA N/A 07/20/2021  ? Procedure: IR WITH ANESTHESIA;  Surgeon: Pedro Earls, MD;  Location: [facility];  Service: Radiology;  Laterality: N/A;  ? ? ?MEDICATIONS: ?Current Outpatient Medications on File Prior to Visit  ?Medication Sig Dispense Refill  ? acetaminophen (TYLENOL) 500 MG tablet Take 1,000 mg by mouth every 6 (six) hours as needed for moderate pain.    ? amLODipine (NORVASC) 10 MG tablet Take 1 tablet by mouth once  daily. 30 tablet 1  ? aspirin EC 81 MG EC tablet Take 1 tablet (81 mg total) by mouth daily. Swallow whole. 30 tablet 11  ? carvedilol (COREG) 25 MG tablet Take 1.5 tablets (37.5 mg total) by mouth 2 (two) times daily with a meal. 60 tablet 0  ? FARXIGA 5 MG TABS tablet TAKE 1 TABLET (5 MG TOTAL) BY MOUTH DAILY. 30 tablet 1  ? fluticasone (FLONASE) 50 MCG/ACT nasal spray Place 1 spray into both nostrils daily as needed for allergies or rhinitis.    ? losartan (COZAAR) 100 MG tablet Take 1 tablet (100 mg total) by mouth daily. 30 tablet 0  ? MELATONIN PO Take 1 capsule by mouth at bedtime as needed (sleep).    ? metFORMIN (GLUCOPHAGE) 1000 MG tablet Take 1 tablet (1,000 mg total) by mouth 2 (two) times daily with a meal. (Patient taking differently: Take 500-1,000 mg by mouth See admin instructions. Take 1000 mg in the morning and 500 mg in the afternoon) 60 tablet 0  ? Multiple Vitamins-Calcium (ONE-A-DAY WOMENS PO) Take 1 tablet by mouth daily.    ? pantoprazole (PROTONIX) 40 MG tablet Take 1 tablet (40 mg total) by mouth at bedtime. 30 tablet 0  ? rosuvastatin (CRESTOR) 40 MG tablet Take 1 tablet (40 mg total) by mouth daily. 30 tablet 0  ? Sodium Chloride, Hypertonic, (MURO 683 OP) Place 1 application into the left eye daily.    ? ticagrelor (BRILINTA) 90 MG TABS tablet Take 1 tablet (90 mg total) by mouth 2 (two) times daily. 60 tablet 1  ? ?No current facility-administered medications on file prior to visit.  ? ? ?ALLERGIES: ?Allergies  ?Allergen Reactions  ? Canagliflozin   ?  dizziness, stabbing pain in right side of body  ? Hydrocodone Hypertension  ? ? ?FAMILY HISTORY: ?Family History  ?Problem Relation Age of Onset  ? Cancer Mother   ? Hypertension Mother   ? Diabetes Mother   ? Breast cancer Maternal Aunt   ? Breast cancer Cousin   ? ? ?Objective:  ?*** ?General: No acute distress.  Patient appears well-groomed.   ?Head:  Normocephalic/atraumatic ?Eyes:  fundi examined but not visualized ?Neck: supple, no  paraspinal tenderness, full range of motion ?Back: No paraspinal tenderness ?Heart: regular rate and rhythm ?Lungs: Clear to auscultation bilaterally. ?Vascular: No carotid bruits. ?Neurological Exam: ?Mental status: alert and oriented to person, place, and time, recent and remote memory intact, fund of knowledge intact, attention and concentration intact, speech fluent and not dysarthric, language intact. ?Cranial nerves: ?CN I: not tested ?CN II: pupils equal, round and reactive to light, visual fields intact ?CN III, IV, VI:  full range of motion, no nystagmus, no ptosis ?CN V: facial sensation intact. ?CN VII: upper and lower face symmetric ?CN VIII: hearing intact ?CN IX, X: gag intact, uvula midline ?CN XI: sternocleidomastoid  and trapezius muscles intact ?CN XII: tongue midline ?Bulk & Tone: normal, no fasciculations. ?Motor:  muscle strength 5/5 throughout ?Sensation:  Pinprick, temperature and vibratory sensation intact. ?Deep Tendon Reflexes:  2+ throughout,  toes downgoing.   ?Finger to nose testing:  Without dysmetria.   ?Heel to shin:  Without dysmetria.   ?Gait:  Normal station and stride.  Romberg negative. ? ? ? ?Thank you for allowing me to take part in the care of this patient. ? ?Metta Clines, DO ? ?CC:  Godfrey Pick, MD ? Leeroy Cha, MD ? ? ? ? ?

## 2021-09-28 ENCOUNTER — Ambulatory Visit: Payer: BC Managed Care – PPO | Admitting: Neurology

## 2021-11-02 ENCOUNTER — Other Ambulatory Visit: Payer: Self-pay

## 2021-11-02 NOTE — Patient Outreach (Signed)
[facility]) Care Management ? ?11/02/2021 ? ?Shannon Oliver ?12-Jun-1961 ?[id number] ? ? ?First telephone outreach attempt to obtain mRS. No answer. Left message for returned call. ? ?Philmore Pali ?[facility]-Care Management Assistant ?[phone] ? ?

## 2021-11-05 ENCOUNTER — Other Ambulatory Visit: Payer: Self-pay

## 2021-11-05 NOTE — Patient Outreach (Signed)
[facility]) Care Management ? ?11/05/2021 ? ?Shannon Oliver ?15-Mar-1961 ?[id number] ? ? ?3 outreach attempts were completed to obtain mRs. mRs could not be obtained because patient never returned my calls. mRs=7 ?  ? ?Shannon Oliver ?Care Management Assistant ?[phone] ? ?

## 2021-11-18 NOTE — Progress Notes (Signed)
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  LDL goal less than 70  - Glycemic control.  Hgb A1c goal less than 7  - Normotensive blood pressure.  Follow up with PCP regarding elevated blood pressure. 2  Contact interventional radiology to schedule follow up cerebral angiogram (supposed to be scheduled for August as per note) 3  Continue home exercises 4  Follow up with me in 6 months.  Subjective:  Shannon Oliver is a 61 year old right-handed female with HTN, DM II, HLD, asthma and sleep apnea who presents for headache and cerebral aneurysm.  History supplemented by hospital records.  CT head, MRI brain and CTA head and neck personally reviewed.  After cataract surgery in November 2022, .she began having severe headaches.  No prior history of headache.  Right occipital pain that became diffuse.  She was found to have a 4 mm left terminus ICA aneurysm and 3 mm right cavernous ICA aneurysm.  She underwent coiling of the left ICA aneurysm on 07/20/2021.  Afterwards, she developed new left sided weakness and numbness followed by right sided weakness.  She received TNKase with improvement of symptoms.  CT head showed no acute abnormality but MRI of brain revealed scattered small and punctate bilateral hemispheric ischemic infarcts without hemorrhage or mass effect.  CTA of head and neck showed coiling of the left ICA aneurysm and untreated 3 mm right ICA aneurysm but no  LVO or significant stenosis.  TTE showed EF 65-70% with grade 1 diastolic dysfunction and no atrial level shunt.  LDL was 69 and Hgb A1c was 8.4.  She was on ASA '81mg'$  daily and Brilinta '90mg'$  BID prior to admission and discharged on same DAPT until 08/26/2021 when Brilinta was to be discontinued.  Following discharge from inpatient rehab, she had repeat cerebral angiogram on 08/19/2021 which was stable without evidence of device migration or impingement on thep parent artery.  Plan for follow-up diagnostic cerebral angiogram in 6 months.  She does not have this scheduled.  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  She reports ongoing fatigue.  Still has some weakness of right hand.  Sometimes she may get slight headaches.  They are still a right dull occipital pressure/slight throbbing headache.  They occur infrequently but it makes her nervous.    Imaging: 06/05/2021 CTA HEAD:  1. 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  2. 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  3. No intracranial large vessel occlusion or proximal high-grade arterial stenosis.  07/20/2021 CTA HEAD/NECK:  1. 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  2.  No intracranial large vessel occlusion or significant stenosis.  3.  No hemodynamically significant stenosis in the neck.  4. Unchanged 3 mm aneurysm projecting from the distal cavernous right ICA. 07/21/2021 MRI BRAIN WO:  1. Scattered, mostly punctate, small  acute infarcts in the cerebral hemispheres left > right. Among the most conspicuous is a 6 mm lacune in the left external capsule abutting the lentiform. No associated hemorrhage or mass effect.  2. Underlying moderate for age signal changes in the brain compatible with chronic small vessel disease. 07/27/2021 MRI BRAIN WO:  Punctate  foci of restricted diffusion, consistent with acute and subacute infarcts, with new infarcts, compared to 07/21/2021, seen in the left occipital lobe, left corona radiata, and left body of the caudate. Given multiple vascular territories, an embolic etiology is suspected 07/28/2021 CTA HEAD/NECK:  1. Unchanged 3 mm aneurysm projecting from the distal cavernous right ICA. No new aneurysm is seen. 2.  No intracranial large vessel occlusion or significant stenosis. 3.  No hemodynamically significant stenosis in the neck.  4. Redemonstrated prior coiling of an aneurysm at the left ICA terminus.   PAST MEDICAL HISTORY: Past Medical History:  Diagnosis Date   Abnormal EKG    LVH with strain   Acid reflux    Asthma    Depression    Diabetes mellitus    A1C over 9   HLD (hyperlipidemia)    Hypertension    Noncompliance    Obesity    Pneumonia    Sleep apnea     PAST SURGICAL HISTORY: Past Surgical History:  Procedure Laterality Date   CARDIAC CATHETERIZATION     CATARACT EXTRACTION Left 06/04/2021   COLONOSCOPY WITH PROPOFOL N/A 04/29/2015   Procedure: COLONOSCOPY WITH PROPOFOL;  Surgeon: Garlan Fair, MD;  Location: [facility];  Service: Endoscopy;  Laterality: N/A;   EYE SURGERY     HYSTEROSCOPY WITH D & C N/A 09/07/2017   Procedure: DILATATION AND CURETTAGE /HYSTEROSCOPY;  Surgeon: Sloan Leiter, MD;  Location: [facility];  Service: Gynecology;  Laterality: N/A;   IR ANGIO INTRA EXTRACRAN SEL COM CAROTID INNOMINATE UNI R MOD SED  06/18/2021   IR ANGIO INTRA EXTRACRAN SEL INTERNAL CAROTID BILAT MOD SED  08/19/2021   IR ANGIO INTRA EXTRACRAN SEL INTERNAL CAROTID UNI L MOD SED  06/18/2021   IR ANGIO INTRA EXTRACRAN SEL INTERNAL CAROTID UNI R MOD SED  07/20/2021   IR ANGIO VERTEBRAL SEL VERTEBRAL UNI R MOD SED  06/18/2021   IR ANGIO VERTEBRAL SEL VERTEBRAL UNI R MOD SED  08/19/2021   IR ANGIOGRAM FOLLOW UP STUDY  07/20/2021   IR CT HEAD LTD  07/20/2021   IR  RADIOLOGIST EVAL & MGMT  06/19/2021   IR TRANSCATH/EMBOLIZ  07/20/2021   IR US GUIDE VASC ACCESS RIGHT  06/18/2021   IR US GUIDE VASC ACCESS RIGHT  08/19/2021   KNEE ARTHROSCOPY W/ MENISCAL REPAIR Right    RADIOLOGY WITH ANESTHESIA N/A 07/20/2021   Procedure: IR WITH ANESTHESIA;  Surgeon: Pedro Earls, MD;  Location: [facility];  Service: Radiology;  Laterality: N/A;    MEDICATIONS: Current Outpatient Medications on File Prior to Visit  Medication Sig Dispense Refill   acetaminophen (TYLENOL) 500 MG tablet Take 1,000 mg by mouth every 6 (six) hours as needed for moderate pain.     amLODipine (NORVASC) 10 MG tablet Take 1 tablet by mouth once daily. 30 tablet 1   aspirin EC 81 MG EC tablet Take 1 tablet (81 mg total) by mouth daily. Swallow whole. 30 tablet 11   carvedilol (COREG) 25 MG tablet Take 1.5 tablets (37.5 mg total) by mouth 2 (two) times daily with a meal. 60 tablet 0   FARXIGA 5 MG  TABS tablet TAKE 1 TABLET (5 MG TOTAL) BY MOUTH DAILY. 30 tablet 1   fluticasone (FLONASE) 50 MCG/ACT nasal spray Place 1 spray into both nostrils daily as needed for allergies or rhinitis.     losartan (COZAAR) 100 MG tablet Take 1 tablet (100 mg total) by mouth daily. 30 tablet 0   MELATONIN PO Take 1 capsule by mouth at bedtime as needed (sleep).     metFORMIN (GLUCOPHAGE) 1000 MG tablet Take 1 tablet (1,000 mg total) by mouth 2 (two) times daily with a meal. (Patient taking differently: Take 500-1,000 mg by mouth See admin instructions. Take 1000 mg in the morning and 500 mg in the afternoon) 60 tablet 0   Multiple Vitamins-Calcium (ONE-A-DAY WOMENS PO) Take 1 tablet by mouth daily.     pantoprazole (PROTONIX) 40 MG tablet Take 1 tablet (40 mg total) by mouth at bedtime. 30 tablet 0   rosuvastatin (CRESTOR) 40 MG tablet Take 1 tablet (40 mg total) by mouth daily. 30 tablet 0   Sodium Chloride, Hypertonic, (MURO 161 OP) Place 1 application into the left eye daily.     ticagrelor (BRILINTA)  90 MG TABS tablet Take 1 tablet (90 mg total) by mouth 2 (two) times daily. 60 tablet 1   No current facility-administered medications on file prior to visit.    ALLERGIES: Allergies  Allergen Reactions   Canagliflozin     dizziness, stabbing pain in right side of body   Hydrocodone Hypertension    FAMILY HISTORY: Family History  Problem Relation Age of Onset   Cancer Mother    Hypertension Mother    Diabetes Mother    Breast cancer Maternal Aunt    Breast cancer Cousin     Objective:  Blood pressure (!) 160/92, pulse 76, height '5\' 6"'$  (1.676 m), weight 247 lb 9.6 oz (112.3 kg), last menstrual period 08/02/2013, SpO2 98 %. General: No acute distress.  Patient appears well-groomed.   Head:  Normocephalic/atraumatic Eyes:  fundi examined but not visualized Neck: supple, mild tenderness bilaterally, full range of motion Back: No paraspinal tenderness Lungs: Clear to auscultation bilaterally. Neurological Exam: Mental status: alert and oriented to person, place, and time, recent and remote memory intact, fund of knowledge intact, attention and concentration intact, speech fluent and not dysarthric, language intact. Cranial nerves: CN I: not tested CN II: pupils equal, round and reactive to light, visual fields intact CN III, IV, VI:  full range of motion, no nystagmus, no ptosis CN V: facial sensation intact. CN VII: upper and lower face symmetric CN VIII: hearing intact CN IX, X: gag intact, uvula midline CN XI: sternocleidomastoid and trapezius muscles intact CN XII: tongue midline Bulk & Tone: normal, no fasciculations. Motor:  maybe trace weakness in right hand grip but negligible; but overall muscle strength 5/5 throughout Sensation:  Pinprick sensation mildly reduced in right upper and lower extremities.  Vibratory sensation mildly decreased in right foot. Deep Tendon Reflexes:  2+ throughout,  toes downgoing.   Finger to nose testing:  Without dysmetria.   Heel to  shin:  Without dysmetria.   Gait:  Normal station and stride.  Romberg negative.    Thank you for allowing me to take part in the care of this patient.  Metta Clines, DO  CC:  Leeroy Cha, MD

## 2021-11-19 ENCOUNTER — Encounter: Payer: Self-pay | Admitting: Neurology

## 2021-11-19 ENCOUNTER — Ambulatory Visit (INDEPENDENT_AMBULATORY_CARE_PROVIDER_SITE_OTHER): Payer: BC Managed Care – PPO | Admitting: Neurology

## 2021-11-19 VITALS — BP 160/92 | HR 76 | Ht 66.0 in | Wt 247.6 lb

## 2021-11-19 DIAGNOSIS — I10 Essential (primary) hypertension: Secondary | ICD-10-CM | POA: Diagnosis not present

## 2021-11-19 DIAGNOSIS — I671 Cerebral aneurysm, nonruptured: Secondary | ICD-10-CM

## 2021-11-19 DIAGNOSIS — E119 Type 2 diabetes mellitus without complications: Secondary | ICD-10-CM | POA: Diagnosis not present

## 2021-11-19 DIAGNOSIS — I6340 Cerebral infarction due to embolism of unspecified cerebral artery: Secondary | ICD-10-CM

## 2021-11-19 DIAGNOSIS — E785 Hyperlipidemia, unspecified: Secondary | ICD-10-CM

## 2021-11-19 NOTE — Patient Instructions (Signed)
Secondary stroke prevention as managed by PCP: - aspirin '81mg'$  daily - statin therapy - diabetes therapy - follow up with PCP about elevated blood pressure  Continue exercises Contact interventional radiology about scheduling follow up cerebral angiogram Follow up with me in 6 months.

## 2021-11-21 ENCOUNTER — Other Ambulatory Visit: Payer: Self-pay | Admitting: Endocrinology

## 2021-12-20 ENCOUNTER — Other Ambulatory Visit: Payer: Self-pay | Admitting: Endocrinology

## 2021-12-20 DIAGNOSIS — E1165 Type 2 diabetes mellitus with hyperglycemia: Secondary | ICD-10-CM

## 2021-12-21 ENCOUNTER — Other Ambulatory Visit (INDEPENDENT_AMBULATORY_CARE_PROVIDER_SITE_OTHER): Payer: BC Managed Care – PPO

## 2021-12-21 DIAGNOSIS — Z794 Long term (current) use of insulin: Secondary | ICD-10-CM | POA: Diagnosis not present

## 2021-12-21 DIAGNOSIS — E1165 Type 2 diabetes mellitus with hyperglycemia: Secondary | ICD-10-CM

## 2021-12-21 LAB — BASIC METABOLIC PANEL
BUN: 15 mg/dL (ref 6–23)
CO2: 28 mEq/L (ref 19–32)
Calcium: 9.3 mg/dL (ref 8.4–10.5)
Chloride: 105 mEq/L (ref 96–112)
Creatinine, Ser: 0.81 mg/dL (ref 0.40–1.20)
GFR: 78.69 mL/min (ref >60.00)
Glucose, Bld: 169 mg/dL — ABNORMAL HIGH (ref 70–99)
Potassium: 3.5 mEq/L (ref 3.5–5.1)
Sodium: 141 mEq/L (ref 135–145)

## 2021-12-21 LAB — MICROALBUMIN / CREATININE URINE RATIO
Creatinine,U: 60.8 mg/dL
Microalb Creat Ratio: 7.1 mg/g (ref 0.0–30.0)
Microalb, Ur: 4.3 mg/dL — ABNORMAL HIGH (ref 0.0–1.9)

## 2021-12-21 LAB — HEMOGLOBIN A1C: Hgb A1c MFr Bld: 9.5 % — ABNORMAL HIGH (ref 4.6–6.5)

## 2021-12-28 ENCOUNTER — Other Ambulatory Visit: Payer: Self-pay | Admitting: Endocrinology

## 2021-12-29 ENCOUNTER — Other Ambulatory Visit: Payer: Self-pay | Admitting: Sports Medicine

## 2021-12-29 DIAGNOSIS — M25561 Pain in right knee: Secondary | ICD-10-CM

## 2021-12-31 ENCOUNTER — Encounter: Payer: Self-pay | Admitting: Endocrinology

## 2021-12-31 ENCOUNTER — Ambulatory Visit (INDEPENDENT_AMBULATORY_CARE_PROVIDER_SITE_OTHER): Payer: BC Managed Care – PPO | Admitting: Endocrinology

## 2021-12-31 VITALS — BP 144/84 | HR 83 | Ht 66.0 in | Wt 247.4 lb

## 2021-12-31 DIAGNOSIS — Z6839 Body mass index (BMI) 39.0-39.9, adult: Secondary | ICD-10-CM

## 2021-12-31 DIAGNOSIS — Z794 Long term (current) use of insulin: Secondary | ICD-10-CM | POA: Diagnosis not present

## 2021-12-31 DIAGNOSIS — E1165 Type 2 diabetes mellitus with hyperglycemia: Secondary | ICD-10-CM

## 2021-12-31 MED ORDER — DEXCOM G7 SENSOR MISC: 1.0000 | 3 refills | Status: DC

## 2021-12-31 MED ORDER — TOUJEO SOLOSTAR 300 UNIT/ML ~~LOC~~ SOPN: 14.0000 [IU] | PEN_INJECTOR | Freq: Every day | SUBCUTANEOUS | 3 refills | Status: DC

## 2021-12-31 NOTE — Patient Instructions (Addendum)
Check blood sugars on waking up 7 days a week  Also check blood sugars about 2 hours after meals and do this after different meals by rotation  Recommended blood sugar levels on waking up are 90-130 and about 2 hours after meal is 130-160  Please bring your blood sugar monitor to each visit, thank you  Fiasp dose to keep 2 hr sugar <180 at least

## 2021-12-31 NOTE — Progress Notes (Signed)
Patient ID: Shannon Oliver, female   DOB: 1961-05-08, 61 y.o.   MRN: [id number]          Reason for Appointment: Follow-up  Referring physician: Dr. Fara Olden   History of Present Illness:          Date of diagnosis of type 2 diabetes mellitus: 2008       Background history:   She thinks she has been on mostly oral hypoglycemic drugs notably metformin and Amaryl for most of the duration of her diabetes She was probably started on insulin in 2018 with Levemir insulin when her A1c was 9.6 She has not had an endocrinology follow-up since 03/2017  Recent history:   INSULIN regimen is:  Novolin 70/30, 20 units once a day Fiasp 8 tid  Non-insulin hypoglycemic drugs the patient is taking are: Metformin '1000mg'$  bid, Farxiga '5mg'$    Recent mealtimes: 10-11 am and 6 pm  A1c is 9.5 compared to 8.5 on her last visit in January  Current management, blood sugar patterns and problems identified: She did not start Toujeo as directed and is still taking 70/30 insulin However she is taking this sometime after supper  Although she has taken [place] not clear if she is taking this regularly Also as before has not checked her blood sugars at various times as directed and only in the morning or occasionally before dinner  She has however tolerated the 5 mg dose of FARXIGA without yeast infections  Renal function recently normal No hypoglycemia with taking her 70/30 insulin but she is having variable fasting readings Has gained weight and likely not as active because of history of stroke        Side effects from medications have been: Dizziness and sharp side pains from Invokana, Rybelsus made her sick, Farxiga   Glucose monitoring:  done <1 times a day         Glucometer: [employer]      Blood Glucose readings by recall    PRE-MEAL Fasting Lunch Dinner Bedtime Overall  Glucose range: 140-170   ? 160-210  Mean/median:        Prior   PRE-MEAL Fasting Lunch Dinner Bedtime Overall  Glucose  range: 127-212   261 127-269  Mean/median: 161 168      POST-MEAL PC Breakfast PC Lunch PC Dinner  Glucose range:   ?  Mean/median:        Self-care: The diet that the patient has been following is: tries to limit sugar drinks.     Meal times are: Usually 2 meals a day, variable times  Typical meal intake: Breakfast is eggs and toast               Dietician visit, most recent: Never  Weight history:  Wt Readings from Last 3 Encounters:  12/31/21 247 lb 6.4 oz (112.2 kg)  11/19/21 247 lb 9.6 oz (112.3 kg)  08/21/21 236 lb (107 kg)    Glycemic control:   Lab Results  Component Value Date   HGBA1C 9.5 (H) 12/21/2021   HGBA1C 8.4 (H) 07/21/2021   HGBA1C 8.4 (H) 07/07/2021   Lab Results  Component Value Date   MICROALBUR 4.3 (H) 12/21/2021   LDLCALC 69 07/21/2021   CREATININE 0.81 12/21/2021   Lab Results  Component Value Date   MICRALBCREAT 7.1 12/21/2021    Lab Results  Component Value Date   FRUCTOSAMINE 305 (H) 08/28/2021   FRUCTOSAMINE 345 (H) 04/22/2021   FRUCTOSAMINE 431 (H) 03/27/2021  Allergies as of 12/31/2021       Reactions   Canagliflozin    dizziness, stabbing pain in right side of body   Hydrocodone Hypertension        Medication List        Accurate as of December 31, 2021 11:59 PM. If you have any questions, ask your nurse or doctor.          STOP taking these medications    NovoLIN 70/30 Kwikpen (70-30) 100 UNIT/ML KwikPen Generic drug: insulin isophane & regular human KwikPen Stopped by: Elayne Snare, MD       TAKE these medications    acetaminophen 500 MG tablet Commonly known as: TYLENOL Take 1,000 mg by mouth every 6 (six) hours as needed for moderate pain.   amLODipine 10 MG tablet Commonly known as: NORVASC Take 1 tablet by mouth once daily.   aspirin EC 81 MG tablet Take 1 tablet (81 mg total) by mouth daily. Swallow whole.   Brilinta 90 MG Tabs tablet Generic drug: ticagrelor Take 1 tablet (90 mg total)  by mouth 2 (two) times daily.   carvedilol 25 MG tablet Commonly known as: COREG Take 1.5 tablets (37.5 mg total) by mouth 2 (two) times daily with a meal.   Dexcom G7 Sensor Misc 1 Device by Does not apply route as directed. Change sensor every 10 days Started by: Elayne Snare, MD   Farxiga 5 MG Tabs tablet Generic drug: dapagliflozin propanediol TAKE 1 TABLET (5 MG TOTAL) BY MOUTH DAILY.   Fiasp FlexTouch 100 UNIT/ML FlexTouch Pen Generic drug: insulin aspart SMARTSIG:8 Unit(s) SUB-Q 3 Times Daily   fluticasone 50 MCG/ACT nasal spray Commonly known as: FLONASE Place 1 spray into both nostrils daily as needed for allergies or rhinitis.   losartan 100 MG tablet Commonly known as: COZAAR take 1 tablet by mouth every day for 90 days   losartan 100 MG tablet Commonly known as: COZAAR Take 1 tablet (100 mg total) by mouth daily.   MELATONIN PO Take 1 capsule by mouth at bedtime as needed (sleep).   metFORMIN 1000 MG tablet Commonly known as: GLUCOPHAGE Take 1 tablet (1,000 mg total) by mouth 2 (two) times daily with a meal. What changed:  how much to take when to take this additional instructions   MURO 315 OP Place 1 application into the left eye daily.   ONE-A-DAY WOMENS PO Take 1 tablet by mouth daily.   pantoprazole 40 MG tablet Commonly known as: PROTONIX Take 1 tablet (40 mg total) by mouth at bedtime.   rosuvastatin 40 MG tablet Commonly known as: CRESTOR Take 1 tablet (40 mg total) by mouth daily.   Toujeo SoloStar 300 UNIT/ML Solostar Pen Generic drug: insulin glargine (1 Unit Dial) Inject 14 Units into the skin daily. Started by: Elayne Snare, MD        Allergies:  Allergies  Allergen Reactions   Canagliflozin     dizziness, stabbing pain in right side of body   Hydrocodone Hypertension    Past Medical History:  Diagnosis Date   Abnormal EKG    LVH with strain   Acid reflux    Asthma    Depression    Diabetes mellitus    A1C over 9    HLD (hyperlipidemia)    Hypertension    Noncompliance    Obesity    Pneumonia    Sleep apnea     Past Surgical History:  Procedure Laterality Date   CARDIAC CATHETERIZATION  CATARACT EXTRACTION Left 06/04/2021   COLONOSCOPY WITH PROPOFOL N/A 04/29/2015   Procedure: COLONOSCOPY WITH PROPOFOL;  Surgeon: Garlan Fair, MD;  Location: [facility];  Service: Endoscopy;  Laterality: N/A;   EYE SURGERY     HYSTEROSCOPY WITH D & C N/A 09/07/2017   Procedure: DILATATION AND CURETTAGE /HYSTEROSCOPY;  Surgeon: Sloan Leiter, MD;  Location: [facility];  Service: Gynecology;  Laterality: N/A;   IR ANGIO INTRA EXTRACRAN SEL COM CAROTID INNOMINATE UNI R MOD SED  06/18/2021   IR ANGIO INTRA EXTRACRAN SEL INTERNAL CAROTID BILAT MOD SED  08/19/2021   IR ANGIO INTRA EXTRACRAN SEL INTERNAL CAROTID UNI L MOD SED  06/18/2021   IR ANGIO INTRA EXTRACRAN SEL INTERNAL CAROTID UNI R MOD SED  07/20/2021   IR ANGIO VERTEBRAL SEL VERTEBRAL UNI R MOD SED  06/18/2021   IR ANGIO VERTEBRAL SEL VERTEBRAL UNI R MOD SED  08/19/2021   IR ANGIOGRAM FOLLOW UP STUDY  07/20/2021   IR CT HEAD LTD  07/20/2021   IR RADIOLOGIST EVAL & MGMT  06/19/2021   IR TRANSCATH/EMBOLIZ  07/20/2021   IR US GUIDE VASC ACCESS RIGHT  06/18/2021   IR US GUIDE VASC ACCESS RIGHT  08/19/2021   KNEE ARTHROSCOPY W/ MENISCAL REPAIR Right    RADIOLOGY WITH ANESTHESIA N/A 07/20/2021   Procedure: IR WITH ANESTHESIA;  Surgeon: Pedro Earls, MD;  Location: [facility];  Service: Radiology;  Laterality: N/A;    Family History  Problem Relation Age of Onset   Cancer Mother    Hypertension Mother    Diabetes Mother    Breast cancer Maternal Aunt    Breast cancer Cousin     Social History:  reports that she has never smoked. She has never used smokeless tobacco. She reports that she does not currently use alcohol. She reports that she does not use drugs.   Review of Systems   Lipid history:  Treated with Zocor 20 mg  daily Her last LDL was below 100    Lab Results  Component Value Date   CHOL 129 07/21/2021   HDL 49 07/21/2021   LDLCALC 69 07/21/2021   TRIG 53 07/21/2021   CHOLHDL 2.6 07/21/2021           Hypertension: She is on losartan 100, carvedilol and Norvasc from cardiologist  Blood pressure is variable  BP Readings from Last 3 Encounters:  12/31/21 (!) 144/84  11/19/21 (!) 160/92  08/21/21 130/80    Hypokalemia: Low normal potassium despite not taking diuretics, previously on Aldactone  Lab Results  Component Value Date   K 3.5 12/21/2021       LABS:  No visits with results within 1 Week(s) from this visit.  Latest known visit with results is:  Lab on 12/21/2021  Component Date Value Ref Range Status   Microalb, Ur 12/21/2021 4.3 (H)  0.0 - 1.9 mg/dL Final   Creatinine,U 12/21/2021 60.8  mg/dL Final   Microalb Creat Ratio 12/21/2021 7.1  0.0 - 30.0 mg/g Final   Sodium 12/21/2021 141  135 - 145 mEq/L Final   Potassium 12/21/2021 3.5  3.5 - 5.1 mEq/L Final   Chloride 12/21/2021 105  96 - 112 mEq/L Final   CO2 12/21/2021 28  19 - 32 mEq/L Final   Glucose, Bld 12/21/2021 169 (H)  70 - 99 mg/dL Final   BUN 12/21/2021 15  6 - 23 mg/dL Final   Creatinine, Ser 12/21/2021 0.81  0.40 - 1.20 mg/dL  Final   GFR 12/21/2021 78.69  >60.00 mL/min Final   Calculated using the CKD-EPI Creatinine Equation (2021)   Calcium 12/21/2021 9.3  8.4 - 10.5 mg/dL Final   Hgb A1c MFr Bld 12/21/2021 9.5 (H)  4.6 - 6.5 % Final   Glycemic Control Guidelines for People with Diabetes:Non Diabetic:  <6%Goal of Therapy: <7%Additional Action Suggested:  >8%     Physical Examination:  BP (!) 144/84   Pulse 83   Ht '5\' 6"'$  (1.676 m)   Wt 247 lb 6.4 oz (112.2 kg)   LMP 08/02/2013 Comment: spotting  SpO2 94%   BMI 39.93 kg/m         ASSESSMENT:  Diabetes type 2, uncontrolled with morbid obesity  See history of present illness for detailed discussion of current diabetes management, blood sugar  patterns and problems identified  Her A1c is back up to 9.5  She is on premixed insulin once a day in the evening only, Fiasp at mealtimes and metformin along with 5 mg Farxiga  Her blood sugars have been checked infrequently at home and mostly in the mornings Not clear if she has high postprandial readings Still does not understand the need to adjust her Fiasp based on postprandial readings Previously was not able to get the CGM approved by insurance Diet has been variable activity level is less, has gained weight BMI is now almost 40   HYPERTENSION: Blood pressure is high normal today Renal function and microalbumin normal  Potassium low normal and likely will need to restart Aldactone, may have had better blood pressure also with this  PLAN:   Again discussed the need for basal insulin instead of premixed insulin at night She will start checking blood sugars consistently either fasting or after meals at least twice a day Also we will see if she can get coverage for the Dexcom G7 sensor and discussed how this would be useful along with information given only sensor and the clarity app She does need considerable diabetes education for better management and motivation along with review of nutritional factors, timing of insulin and adjustment of Fiasp is also further titration of Toujeo if needed for fasting hyperglycemia Discussed targeting Fiasp to keep the 2-hour glucose under 180 TOUJEO was restarted with 14 units which is equivalent of the NPH dose she is currently getting and given her flowsheet to titrate her dose based on fasting readings every 3 days More regular follow-up Exercise as tolerated   Patient Instructions  Check blood sugars on waking up 7 days a week  Also check blood sugars about 2 hours after meals and do this after different meals by rotation  Recommended blood sugar levels on waking up are 90-130 and about 2 hours after meal is 130-160  Please bring your  blood sugar monitor to each visit, thank you  Fiasp dose to keep 2 hr sugar <180 at least    Elayne Snare 01/01/2022, 1:15 PM   Note: This office note was prepared with Estate agent. Any transcriptional errors that result from this process are unintentional.

## 2022-01-13 ENCOUNTER — Telehealth: Payer: Self-pay

## 2022-01-13 NOTE — Telephone Encounter (Signed)
Patient called in states her insurance company is requesting a PA to be done for her Dexcom G7 sensors.

## 2022-01-18 ENCOUNTER — Other Ambulatory Visit (HOSPITAL_COMMUNITY): Payer: Self-pay

## 2022-01-18 NOTE — Telephone Encounter (Signed)
Patient Advocate Encounter   Received notification from [place] that prior authorization for Dexcom G7 is required/requested.   PA submitted on 01/18/22 to [employer] via CoverMyMeds [place] Status is pending  Pharmacy Patient Advocate Fax:  [phone]

## 2022-01-21 NOTE — Telephone Encounter (Signed)
Patient Advocate Encounter  Prior Authorization for [employer] has been approved.    PA# [id number] Effective dates: 01/18/2022 through 01/19/2023  Clista Bernhardt, CPhT Pharmacy Patient Advocate Specialist [place] Patient Advocate Team Phone: [phone]   Fax: [phone]

## 2022-01-22 NOTE — Telephone Encounter (Signed)
Patient has been informed of Prior auth approval of the Dexcom G7 sensors

## 2022-01-26 ENCOUNTER — Encounter: Payer: Self-pay | Admitting: Podiatry

## 2022-01-26 ENCOUNTER — Ambulatory Visit (INDEPENDENT_AMBULATORY_CARE_PROVIDER_SITE_OTHER): Payer: BC Managed Care – PPO | Admitting: Podiatry

## 2022-01-26 DIAGNOSIS — D2371 Other benign neoplasm of skin of right lower limb, including hip: Secondary | ICD-10-CM

## 2022-01-26 DIAGNOSIS — Q828 Other specified congenital malformations of skin: Secondary | ICD-10-CM

## 2022-01-26 DIAGNOSIS — M722 Plantar fascial fibromatosis: Secondary | ICD-10-CM

## 2022-01-26 DIAGNOSIS — D2372 Other benign neoplasm of skin of left lower limb, including hip: Secondary | ICD-10-CM

## 2022-01-26 NOTE — Progress Notes (Signed)
She presents today for bilateral heel pain she states she is gets a little tiny callused area that is bothering her she says but the plan fasciitis is doing just great.  Objective: Vital signs are stable she alert and oriented x3 she has porokeratosis to the plantar aspect of the bilateral heels just one lesion is noted and it is not even on the weightbearing surface.  The are mildly tender but do not demonstrate any site of or signs of foreign body.  Assessment: Benign skin lesions bilateral.  Plan: Follow-up with me as needed.

## 2022-02-01 ENCOUNTER — Ambulatory Visit
Admission: RE | Admit: 2022-02-01 | Discharge: 2022-02-01 | Disposition: A | Discharge status: Home / Self Care | Payer: BC Managed Care – PPO | Source: Ambulatory Visit | Attending: Sports Medicine | Admitting: Sports Medicine

## 2022-02-01 DIAGNOSIS — M25561 Pain in right knee: Secondary | ICD-10-CM

## 2022-02-09 ENCOUNTER — Other Ambulatory Visit: Payer: Self-pay | Admitting: Endocrinology

## 2022-02-09 ENCOUNTER — Telehealth: Payer: Self-pay | Admitting: Nutrition

## 2022-02-09 DIAGNOSIS — E1165 Type 2 diabetes mellitus with hyperglycemia: Secondary | ICD-10-CM

## 2022-02-09 NOTE — Telephone Encounter (Signed)
Pt. Left me a message saying she started her Dexcom G7 and blood sugars are "all over the place".  She asked for dietary information to be sent to her. I phoned her back and got her to download the clarity app, and then linked her to this practice.  After speaking with her, she wishes to see the dietitian.  I told her that I will get the referral from the doctor for her to see the dietitian, and that they will call her to schedule the visit.    She had no final questions

## 2022-03-02 ENCOUNTER — Other Ambulatory Visit: Payer: Self-pay | Admitting: Endocrinology

## 2022-03-18 ENCOUNTER — Other Ambulatory Visit: Payer: Self-pay | Admitting: Endocrinology

## 2022-03-23 ENCOUNTER — Other Ambulatory Visit (INDEPENDENT_AMBULATORY_CARE_PROVIDER_SITE_OTHER): Payer: BC Managed Care – PPO

## 2022-03-23 DIAGNOSIS — E1165 Type 2 diabetes mellitus with hyperglycemia: Secondary | ICD-10-CM | POA: Diagnosis not present

## 2022-03-23 DIAGNOSIS — Z794 Long term (current) use of insulin: Secondary | ICD-10-CM

## 2022-03-23 LAB — BASIC METABOLIC PANEL
BUN: 16 mg/dL (ref 6–23)
CO2: 26 mEq/L (ref 19–32)
Calcium: 9.4 mg/dL (ref 8.4–10.5)
Chloride: 107 mEq/L (ref 96–112)
Creatinine, Ser: 0.83 mg/dL (ref 0.40–1.20)
GFR: 76.28 mL/min (ref >60.00)
Glucose, Bld: 134 mg/dL — ABNORMAL HIGH (ref 70–99)
Potassium: 3.7 mEq/L (ref 3.5–5.1)
Sodium: 141 mEq/L (ref 135–145)

## 2022-03-23 LAB — HEMOGLOBIN A1C: Hgb A1c MFr Bld: 8.6 % — ABNORMAL HIGH (ref 4.6–6.5)

## 2022-03-25 ENCOUNTER — Ambulatory Visit: Payer: BC Managed Care – PPO | Admitting: Endocrinology

## 2022-03-31 ENCOUNTER — Encounter: Payer: BC Managed Care – PPO | Admitting: Endocrinology

## 2022-03-31 ENCOUNTER — Encounter: Payer: Self-pay | Admitting: Endocrinology

## 2022-03-31 NOTE — Progress Notes (Signed)
This encounter was created in error - please disregard.

## 2022-04-11 ENCOUNTER — Other Ambulatory Visit: Payer: Self-pay | Admitting: Endocrinology

## 2022-04-30 ENCOUNTER — Ambulatory Visit (INDEPENDENT_AMBULATORY_CARE_PROVIDER_SITE_OTHER): Payer: BC Managed Care – PPO | Admitting: Endocrinology

## 2022-04-30 ENCOUNTER — Encounter: Payer: Self-pay | Admitting: Endocrinology

## 2022-04-30 VITALS — BP 136/76 | HR 76 | Ht 66.0 in | Wt 248.0 lb

## 2022-04-30 DIAGNOSIS — I10 Essential (primary) hypertension: Secondary | ICD-10-CM | POA: Diagnosis not present

## 2022-04-30 DIAGNOSIS — E1165 Type 2 diabetes mellitus with hyperglycemia: Secondary | ICD-10-CM

## 2022-04-30 DIAGNOSIS — Z794 Long term (current) use of insulin: Secondary | ICD-10-CM

## 2022-04-30 NOTE — Patient Instructions (Addendum)
Take 10-12 Fiasp at supper keep nite sugar <180  12 toujeo and go up if am sugar is >130

## 2022-04-30 NOTE — Progress Notes (Signed)
Patient ID: Shannon Oliver, female   DOB: [birth year], 61 y.o.   MRN: [id number]          Reason for Appointment: Follow-up  Referring physician: Dr. Fara Olden   History of Present Illness:          Date of diagnosis of type 2 diabetes mellitus: 2008       Background history:   She thinks she has been on mostly oral hypoglycemic drugs notably metformin and Amaryl for most of the duration of her diabetes She was probably started on insulin in 2018 with Levemir insulin when her A1c was 9.6 She has not had an endocrinology follow-up since 03/2017  Recent history:   INSULIN regimen is: Toujeo 10 units once a day Fiasp 8 tid  Non-insulin hypoglycemic drugs the patient is taking are: Metformin '1000mg'$  bid, Farxiga '5mg'$     A1c is 8.6 compared to 9.5  Current management, blood sugar patterns and problems identified: She did start Toujeo as directed but even though she was told to take 14 units she is only taking 10  She now thinks that she is mostly eating 1 meal a day and taking Fiasp as directed usually before eating However her blood sugars are mostly higher Premeal and generally higher after dinner Previously because of inadequate monitoring was difficult to determine what her blood sugars were after dinner       Side effects from medications have been: Dizziness and sharp side pains from Invokana, Rybelsus made her sick, Farxiga 10 mg   CGM results from Dexcom 7 sensor download between 10/20 and 10/27  Blood sugar patterns: Blood sugars are relatively higher all day and highest in the evenings after 7 PM Hypoglycemia is mostly seen in the evenings to variable extent of any lasting into late at night Hypoglycemia not present No spikes seen during the day usually except occasionally at lunch On an average blood sugars are fairly level early morning and fasting readings are about 150 Highest blood sugars on an average are 227 after dinner  CGM use % of time   2-week average/GV  170/25   Time in range        65%  % Time Above 180 28  % Time above 250 7  % Time Below 70       Self-care: The diet that the patient has been following is: tries to limit sugar drinks.     Meal times are: Usually 2 meals a day, variable times  Typical meal intake: Breakfast is eggs and toast               Dietician visit, most recent: Never  Weight history:  Wt Readings from Last 3 Encounters:  04/30/22 248 lb (112.5 kg)  03/31/22 251 lb (113.9 kg)  12/31/21 247 lb 6.4 oz (112.2 kg)    Glycemic control:   Lab Results  Component Value Date   HGBA1C 8.6 (H) 03/23/2022   HGBA1C 9.5 (H) 12/21/2021   HGBA1C 8.4 (H) 07/21/2021   Lab Results  Component Value Date   MICROALBUR 4.3 (H) 12/21/2021   LDLCALC 69 07/21/2021   CREATININE 0.83 03/23/2022   Lab Results  Component Value Date   MICRALBCREAT 7.1 12/21/2021    Lab Results  Component Value Date   FRUCTOSAMINE 305 (H) 08/28/2021   FRUCTOSAMINE 345 (H) 04/22/2021   FRUCTOSAMINE 431 (H) 03/27/2021      Allergies as of 04/30/2022       Reactions   Canagliflozin  dizziness, stabbing pain in right side of body   Hydrocodone Hypertension        Medication List        Accurate as of April 30, 2022 11:59 PM. If you have any questions, ask your nurse or doctor.          acetaminophen 500 MG tablet Commonly known as: TYLENOL Take 1,000 mg by mouth every 6 (six) hours as needed for moderate pain.   amLODipine 10 MG tablet Commonly known as: NORVASC Take 1 tablet by mouth once daily.   aspirin EC 81 MG tablet Take 1 tablet (81 mg total) by mouth daily. Swallow whole.   Brilinta 90 MG Tabs tablet Generic drug: ticagrelor Take 1 tablet (90 mg total) by mouth 2 (two) times daily.   carvedilol 25 MG tablet Commonly known as: COREG Take 1.5 tablets (37.5 mg total) by mouth 2 (two) times daily with a meal.   Dexcom G7 Sensor Misc 1 Device by Does not apply route as directed. Change sensor  every 10 days   Farxiga 5 MG Tabs tablet Generic drug: dapagliflozin propanediol TAKE 1 TABLET (5 MG TOTAL) BY MOUTH DAILY.   Fiasp FlexTouch 100 UNIT/ML FlexTouch Pen Generic drug: insulin aspart INJECT 8 UNITS INTO THE SKIN 3 (THREE) TIMES DAILY BEFORE MEALS.   fluticasone 50 MCG/ACT nasal spray Commonly known as: FLONASE Place 1 spray into both nostrils daily as needed for allergies or rhinitis.   losartan 100 MG tablet Commonly known as: COZAAR take 1 tablet by mouth every day for 90 days   losartan 100 MG tablet Commonly known as: COZAAR Take 1 tablet (100 mg total) by mouth daily.   MELATONIN PO Take 1 capsule by mouth at bedtime as needed (sleep).   metFORMIN 1000 MG tablet Commonly known as: GLUCOPHAGE Take 1 tablet (1,000 mg total) by mouth 2 (two) times daily with a meal. What changed:  how much to take when to take this additional instructions   MURO 989 OP Place 1 application into the left eye daily.   ONE-A-DAY WOMENS PO Take 1 tablet by mouth daily.   pantoprazole 40 MG tablet Commonly known as: PROTONIX Take 1 tablet (40 mg total) by mouth at bedtime.   rosuvastatin 40 MG tablet Commonly known as: CRESTOR Take 1 tablet (40 mg total) by mouth daily.   Toujeo SoloStar 300 UNIT/ML Solostar Pen Generic drug: insulin glargine (1 Unit Dial) Inject 14 Units into the skin daily.        Allergies:  Allergies  Allergen Reactions   Canagliflozin     dizziness, stabbing pain in right side of body   Hydrocodone Hypertension    Past Medical History:  Diagnosis Date   Abnormal EKG    LVH with strain   Acid reflux    Asthma    Depression    Diabetes mellitus    A1C over 9   HLD (hyperlipidemia)    Hypertension    Noncompliance    Obesity    Pneumonia    Sleep apnea     Past Surgical History:  Procedure Laterality Date   CARDIAC CATHETERIZATION     CATARACT EXTRACTION Left 06/04/2021   COLONOSCOPY WITH PROPOFOL N/A 04/29/2015    Procedure: COLONOSCOPY WITH PROPOFOL;  Surgeon: Garlan Fair, MD;  Location: [facility];  Service: Endoscopy;  Laterality: N/A;   EYE SURGERY     HYSTEROSCOPY WITH D & C N/A 09/07/2017   Procedure: DILATATION AND CURETTAGE /HYSTEROSCOPY;  Surgeon:  Sloan Leiter, MD;  Location: [facility];  Service: Gynecology;  Laterality: N/A;   IR ANGIO INTRA EXTRACRAN SEL COM CAROTID INNOMINATE UNI R MOD SED  06/18/2021   IR ANGIO INTRA EXTRACRAN SEL INTERNAL CAROTID BILAT MOD SED  08/19/2021   IR ANGIO INTRA EXTRACRAN SEL INTERNAL CAROTID UNI L MOD SED  06/18/2021   IR ANGIO INTRA EXTRACRAN SEL INTERNAL CAROTID UNI R MOD SED  07/20/2021   IR ANGIO VERTEBRAL SEL VERTEBRAL UNI R MOD SED  06/18/2021   IR ANGIO VERTEBRAL SEL VERTEBRAL UNI R MOD SED  08/19/2021   IR ANGIOGRAM FOLLOW UP STUDY  07/20/2021   IR CT HEAD LTD  07/20/2021   IR RADIOLOGIST EVAL & MGMT  06/19/2021   IR TRANSCATH/EMBOLIZ  07/20/2021   IR US GUIDE VASC ACCESS RIGHT  06/18/2021   IR US GUIDE VASC ACCESS RIGHT  08/19/2021   KNEE ARTHROSCOPY W/ MENISCAL REPAIR Right    RADIOLOGY WITH ANESTHESIA N/A 07/20/2021   Procedure: IR WITH ANESTHESIA;  Surgeon: Pedro Earls, MD;  Location: [facility];  Service: Radiology;  Laterality: N/A;    Family History  Problem Relation Age of Onset   Cancer Mother    Hypertension Mother    Diabetes Mother    Breast cancer Maternal Aunt    Breast cancer Cousin     Social History:  reports that she has never smoked. She has never used smokeless tobacco. She reports that she does not currently use alcohol. She reports that she does not use drugs.   Review of Systems   Lipid history:  Treated with Zocor 20 mg daily Her last LDL was below 100    Lab Results  Component Value Date   CHOL 129 07/21/2021   HDL 49 07/21/2021   LDLCALC 69 07/21/2021   TRIG 53 07/21/2021   CHOLHDL 2.6 07/21/2021           Hypertension: She is on losartan 100, carvedilol and Norvasc from  cardiologist  Blood pressure is variable  BP Readings from Last 3 Encounters:  04/30/22 136/76  03/31/22 (!) 140/90  12/31/21 (!) 144/84    Hypokalemia: Low normal potassium despite not taking diuretics, previously on Aldactone  Lab Results  Component Value Date   K 3.7 03/23/2022       LABS:  No visits with results within 1 Week(s) from this visit.  Latest known visit with results is:  Lab on 03/23/2022  Component Date Value Ref Range Status   Sodium 03/23/2022 141  135 - 145 mEq/L Final   Potassium 03/23/2022 3.7  3.5 - 5.1 mEq/L Final   Chloride 03/23/2022 107  96 - 112 mEq/L Final   CO2 03/23/2022 26  19 - 32 mEq/L Final   Glucose, Bld 03/23/2022 134 (H)  70 - 99 mg/dL Final   BUN 03/23/2022 16  6 - 23 mg/dL Final   Creatinine, Ser 03/23/2022 0.83  0.40 - 1.20 mg/dL Final   GFR 03/23/2022 76.28  >60.00 mL/min Final   Calculated using the CKD-EPI Creatinine Equation (2021)   Calcium 03/23/2022 9.4  8.4 - 10.5 mg/dL Final   Hgb A1c MFr Bld 03/23/2022 8.6 (H)  4.6 - 6.5 % Final   Glycemic Control Guidelines for People with Diabetes:Non Diabetic:  <6%Goal of Therapy: <7%Additional Action Suggested:  >8%     Physical Examination:  BP 136/76   Pulse 76   Ht '5\' 6"'$  (1.676 m)   Wt 248 lb (112.5 kg)  LMP 08/02/2013 Comment: spotting  SpO2 98%   BMI 40.03 kg/m         ASSESSMENT:  Diabetes type 2, uncontrolled with morbid obesity  See history of present illness for detailed discussion of current diabetes management, blood sugar patterns from her Dexcom download and problems identified  Her A1c is 8.6  She is on TOUJEO insulin once a day with Fiasp at mealtimes and metformin along with 5 mg Farxiga  Her blood sugars have been assessed lately with the Dexcom monitor but despite having high readings all the time especially after dinner she did not report this to [place] since her last visit in June She also is taking only 10 units of Toujeo instead of 14 as  prescribed Her diet is fair and sometimes higher in carbohydrates causing blood sugars to be as high as 326 after dinner  Also still not able to lose weight Today discussed the differences between basal and bolus insulin and targets used to titrate both fasting and postmeal readings  HYPERTENSION: Blood pressure is normal today Renal function and potassium normal, on Farxiga also    PLAN:   As above discussed the need to adjust her basal insulin to keep her morning sugars down below 130 She will start with at least 12 units for now and go up to 14 of Toujeo his morning sugars stay over 130 Also at dinnertime she will take at least 10 units of Fiasp and for larger meals or more carbohydrate to at least 12 This may need to be increased further if postprandial readings are over 180 She will need to modify her diet if she sees blood sugars higher with certain foods More regular follow-up Exercise needs to be increased   Patient Instructions  Take 10-12 Fiasp at supper keep nite sugar <180  12 toujeo and go up if am sugar is >130    Total visit time including counseling = 30 minutes   Elayne Snare 05/02/2022, 8:59 PM   Note: This office note was prepared with Dragon voice recognition system technology. Any transcriptional errors that result from this process are unintentional.

## 2022-05-04 ENCOUNTER — Encounter: Payer: Self-pay | Admitting: Endocrinology

## 2022-05-14 ENCOUNTER — Other Ambulatory Visit: Payer: Self-pay | Admitting: Sports Medicine

## 2022-05-14 DIAGNOSIS — M25562 Pain in left knee: Secondary | ICD-10-CM

## 2022-05-24 NOTE — Progress Notes (Unsigned)
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  LDL goal less than 70             - Glycemic control.  Hgb A1c goal less than 7             - Normotensive blood pressure.  Follow up with PCP regarding elevated blood pressure. 2  Aneurysm followed by interventional radiology.  She was again instructed to contact interventional vascular radiology to schedule follow up (phone numbers provided) 3  Advised to continue home exercises 4  Advised to start using CPAP 5  Follow up with me as needed.   Subjective:  Shannon Oliver is a 61 year old right-handed female with HTN, DM II, HLD, asthma and sleep apnea who follows up for aneurysm and stroke.    UPDATE: Last visit, she was advised to contact interventional radiology to schedule follow up cerebral arteriogram.  She hasn't thus far.    Hgb A1c in September was 8.6.   Reports ongoing fatigue.  Has OSA but doesn't use CPAP.  No longer exercising due to fatigue.  Has generalized pain related to arthritis.     HISTORY: After cataract surgery in November 2022, .she began having severe headaches.  No prior history of headache.  Right occipital pain that became diffuse.  She was found to have a 4 mm left terminus ICA aneurysm and 3 mm right cavernous ICA aneurysm.  She underwent coiling of the left ICA aneurysm on 07/20/2021.  Afterwards, she developed new left sided weakness and numbness followed by right sided  weakness.  She received TNKase with improvement of symptoms.  CT head showed no acute abnormality but MRI of brain revealed scattered small and punctate bilateral hemispheric ischemic infarcts without hemorrhage or mass effect.  CTA of head and neck showed coiling of the left ICA aneurysm and untreated 3 mm right ICA aneurysm but no LVO or significant stenosis.  TTE showed EF 65-70% with grade 1 diastolic dysfunction and no atrial level shunt.  LDL was 69 and Hgb A1c was 8.4.  She was on ASA '81mg'$  daily and Brilinta '90mg'$  BID prior to admission and discharged on same DAPT until 08/26/2021 when Brilinta was to be discontinued.  Following discharge from inpatient rehab, she had repeat cerebral angiogram on 08/19/2021 which was stable without evidence of device migration or impingement on thep parent artery.  Plan for follow-up diagnostic cerebral angiogram in 6 months.  She does not have this scheduled.   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  She reports ongoing fatigue.  Still has some weakness of right hand.  Sometimes she may get slight headaches.  They are still a right dull occipital pressure/slight throbbing headache.  They occur infrequently but it makes her nervous.     Imaging: 06/05/2021 CTA HEAD:  1. 4 mm superiorly  projecting aneurysm arising from the left ICA terminus, at the origin of the left anterior and middle cerebral arteries.  2. 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  3. No intracranial large vessel occlusion or proximal high-grade arterial stenosis.  07/20/2021 CTA HEAD/NECK:  1. 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  2.  No intracranial large vessel occlusion or significant stenosis.  3.  No hemodynamically significant stenosis in the neck.  4. Unchanged 3 mm aneurysm projecting from the distal cavernous right ICA. 07/21/2021 MRI BRAIN WO:  1. Scattered, mostly punctate, small  acute infarcts in the cerebral hemispheres left > right. Among the most conspicuous is a 6 mm lacune in the left external capsule abutting the lentiform. No associated hemorrhage or mass effect.  2. Underlying moderate for age signal changes in the brain compatible with chronic small vessel disease. 07/27/2021 MRI BRAIN WO:  Punctate foci of restricted diffusion, consistent with acute and subacute infarcts, with new infarcts, compared to 07/21/2021, seen in the left occipital lobe, left corona radiata, and left body of the caudate. Given multiple vascular territories, an embolic etiology is suspected 07/28/2021 CTA HEAD/NECK:  1. Unchanged 3 mm aneurysm projecting from the distal cavernous right ICA. No new aneurysm is seen. 2.  No intracranial large vessel occlusion or significant stenosis. 3.  No hemodynamically significant stenosis in the neck.  4. Redemonstrated prior coiling of an aneurysm at the left ICA terminus.  PAST MEDICAL HISTORY: Past Medical History:  Diagnosis Date   Abnormal EKG    LVH with strain   Acid reflux    Asthma    Depression    Diabetes mellitus    A1C over 9   HLD (hyperlipidemia)    Hypertension    Noncompliance    Obesity    Pneumonia    Sleep apnea     MEDICATIONS: Current Outpatient Medications on File Prior to Visit  Medication Sig Dispense Refill   acetaminophen (TYLENOL) 500 MG tablet Take 1,000 mg by mouth every 6 (six) hours as needed for moderate pain.     amLODipine (NORVASC) 10 MG tablet Take 1 tablet by mouth once daily. 30 tablet 1   aspirin EC 81 MG EC tablet Take 1 tablet (81 mg total) by mouth daily. Swallow whole. 30 tablet 11   carvedilol (COREG) 25 MG tablet Take 1.5 tablets (37.5 mg total) by mouth 2 (two) times daily with a meal. 60 tablet 0   Continuous Blood Gluc Sensor (DEXCOM G7 SENSOR) MISC 1 Device by Does not apply route as directed. Change sensor every 10 days 3 each 3   FARXIGA 5 MG TABS tablet TAKE 1 TABLET (5 MG TOTAL) BY  MOUTH DAILY. 30 tablet 1   FIASP FLEXTOUCH 100 UNIT/ML FlexTouch Pen INJECT 8 UNITS INTO THE SKIN 3 (THREE) TIMES DAILY BEFORE MEALS. 15 mL 1   fluticasone (FLONASE) 50 MCG/ACT nasal spray Place 1 spray into both nostrils daily as needed for allergies or rhinitis.     insulin glargine, 1 Unit Dial, (TOUJEO SOLOSTAR) 300 UNIT/ML Solostar Pen Inject 14 Units into the skin daily. 3 mL 3   losartan (COZAAR) 100 MG tablet Take 1 tablet (100 mg total) by mouth daily. 30 tablet 0   losartan (COZAAR) 100 MG tablet take 1 tablet by mouth every day for 90 days     MELATONIN PO Take 1 capsule by mouth at bedtime as needed (sleep).     metFORMIN (  GLUCOPHAGE) 1000 MG tablet Take 1 tablet (1,000 mg total) by mouth 2 (two) times daily with a meal. (Patient taking differently: Take 500-1,000 mg by mouth See admin instructions. Take 1000 mg in the morning and 500 mg in the afternoon) 60 tablet 0   Multiple Vitamins-Calcium (ONE-A-DAY WOMENS PO) Take 1 tablet by mouth daily.     pantoprazole (PROTONIX) 40 MG tablet Take 1 tablet (40 mg total) by mouth at bedtime. (Patient not taking: Reported on 12/31/2021) 30 tablet 0   rosuvastatin (CRESTOR) 40 MG tablet Take 1 tablet (40 mg total) by mouth daily. 30 tablet 0   Sodium Chloride, Hypertonic, (MURO 128 OP) Place 1 application into the left eye daily.     ticagrelor (BRILINTA) 90 MG TABS tablet Take 1 tablet (90 mg total) by mouth 2 (two) times daily. (Patient not taking: Reported on 12/31/2021) 60 tablet 1   No current facility-administered medications on file prior to visit.    ALLERGIES: Allergies  Allergen Reactions   Canagliflozin     dizziness, stabbing pain in right side of body   Hydrocodone Hypertension    FAMILY HISTORY: Family History  Problem Relation Age of Onset   Cancer Mother    Hypertension Mother    Diabetes Mother    Breast cancer Maternal Aunt    Breast cancer Cousin       Objective:  Blood pressure (!) 175/95, pulse 75, height 5'  6" (1.676 m), weight 244 lb 12.8 oz (111 kg), last menstrual period 08/02/2013, SpO2 95 %. General: No acute distress.  Patient appears well-groomed.   Head:  Normocephalic/atraumatic Eyes:  Fundi examined but not visualized Neck: supple, no paraspinal tenderness, full range of motion Heart:  Regular rate and rhythm Neurological Exam: alert and oriented to person, place, and time.  Speech fluent and not dysarthric, language intact.  CN II-XII intact. Bulk and tone normal, muscle strength 5/5 throughout.  Pinprick sensation slightly reduced in feet. Vibratory sensation reduced in feet. Deep tendon reflexes 2+ throughout, toes downgoing.  Finger to nose testing intact.  Gait steady.  Romberg with sway.     Metta Clines, DO  CC: Leeroy Cha, MD

## 2022-05-25 ENCOUNTER — Other Ambulatory Visit: Payer: Self-pay | Admitting: Endocrinology

## 2022-05-25 ENCOUNTER — Ambulatory Visit (INDEPENDENT_AMBULATORY_CARE_PROVIDER_SITE_OTHER): Payer: BC Managed Care – PPO | Admitting: Neurology

## 2022-05-25 ENCOUNTER — Encounter: Payer: Self-pay | Admitting: Neurology

## 2022-05-25 VITALS — BP 175/95 | HR 75 | Ht 66.0 in | Wt 244.8 lb

## 2022-05-25 DIAGNOSIS — I6340 Cerebral infarction due to embolism of unspecified cerebral artery: Secondary | ICD-10-CM | POA: Diagnosis not present

## 2022-05-25 DIAGNOSIS — E785 Hyperlipidemia, unspecified: Secondary | ICD-10-CM

## 2022-05-25 DIAGNOSIS — E119 Type 2 diabetes mellitus without complications: Secondary | ICD-10-CM | POA: Diagnosis not present

## 2022-05-25 DIAGNOSIS — I10 Essential (primary) hypertension: Secondary | ICD-10-CM | POA: Diagnosis not present

## 2022-05-25 DIAGNOSIS — G4733 Obstructive sleep apnea (adult) (pediatric): Secondary | ICD-10-CM

## 2022-05-25 DIAGNOSIS — I671 Cerebral aneurysm, nonruptured: Secondary | ICD-10-CM

## 2022-05-25 DIAGNOSIS — I69331 Monoplegia of upper limb following cerebral infarction affecting right dominant side: Secondary | ICD-10-CM

## 2022-05-25 NOTE — Patient Instructions (Addendum)
Continue aspirin '81mg'$  daily, blood pressure medication, cholesterol medication and diabetes medication Start using the CPAP Contact Dr Karenann Cai - [phone] OR [phone] Follow up as needed.

## 2022-05-27 ENCOUNTER — Other Ambulatory Visit: Payer: Self-pay | Admitting: Endocrinology

## 2022-05-27 DIAGNOSIS — E1165 Type 2 diabetes mellitus with hyperglycemia: Secondary | ICD-10-CM

## 2022-06-04 ENCOUNTER — Ambulatory Visit
Admission: RE | Admit: 2022-06-04 | Discharge: 2022-06-04 | Disposition: A | Discharge status: Home / Self Care | Payer: BC Managed Care – PPO | Source: Ambulatory Visit | Attending: Sports Medicine | Admitting: Sports Medicine

## 2022-06-04 DIAGNOSIS — M25562 Pain in left knee: Secondary | ICD-10-CM

## 2022-06-19 ENCOUNTER — Other Ambulatory Visit: Payer: Self-pay | Admitting: Endocrinology

## 2022-07-31 ENCOUNTER — Encounter (HOSPITAL_BASED_OUTPATIENT_CLINIC_OR_DEPARTMENT_OTHER): Payer: Self-pay

## 2022-07-31 ENCOUNTER — Emergency Department (HOSPITAL_BASED_OUTPATIENT_CLINIC_OR_DEPARTMENT_OTHER)
Admission: EM | Admit: 2022-07-31 | Discharge: 2022-07-31 | Disposition: A | Discharge status: Home / Self Care | Payer: BC Managed Care – PPO | Attending: Emergency Medicine | Admitting: Emergency Medicine

## 2022-07-31 ENCOUNTER — Emergency Department (HOSPITAL_BASED_OUTPATIENT_CLINIC_OR_DEPARTMENT_OTHER): Payer: BC Managed Care – PPO | Admitting: Radiology

## 2022-07-31 ENCOUNTER — Other Ambulatory Visit: Payer: Self-pay

## 2022-07-31 DIAGNOSIS — Z7984 Long term (current) use of oral hypoglycemic drugs: Secondary | ICD-10-CM | POA: Insufficient documentation

## 2022-07-31 DIAGNOSIS — Z20822 Contact with and (suspected) exposure to covid-19: Secondary | ICD-10-CM | POA: Diagnosis not present

## 2022-07-31 DIAGNOSIS — Z794 Long term (current) use of insulin: Secondary | ICD-10-CM | POA: Diagnosis not present

## 2022-07-31 DIAGNOSIS — Z7982 Long term (current) use of aspirin: Secondary | ICD-10-CM | POA: Diagnosis not present

## 2022-07-31 DIAGNOSIS — R059 Cough, unspecified: Secondary | ICD-10-CM | POA: Diagnosis present

## 2022-07-31 DIAGNOSIS — Z79899 Other long term (current) drug therapy: Secondary | ICD-10-CM | POA: Insufficient documentation

## 2022-07-31 DIAGNOSIS — E119 Type 2 diabetes mellitus without complications: Secondary | ICD-10-CM | POA: Insufficient documentation

## 2022-07-31 DIAGNOSIS — J40 Bronchitis, not specified as acute or chronic: Secondary | ICD-10-CM | POA: Diagnosis not present

## 2022-07-31 DIAGNOSIS — I10 Essential (primary) hypertension: Secondary | ICD-10-CM | POA: Insufficient documentation

## 2022-07-31 LAB — CBC WITH DIFFERENTIAL/PLATELET
Abs Immature Granulocytes: 0.02 10*3/uL (ref 0.00–0.07)
Basophils Absolute: 0.1 10*3/uL (ref 0.0–0.1)
Basophils Relative: 1 %
Eosinophils Absolute: 0.2 10*3/uL (ref 0.0–0.5)
Eosinophils Relative: 3 %
HCT: 40.1 % (ref 36.0–46.0)
Hemoglobin: 13.1 g/dL (ref 12.0–15.0)
Immature Granulocytes: 0 %
Lymphocytes Relative: 32 %
Lymphs Abs: 1.7 10*3/uL (ref 0.7–4.0)
MCH: 29 pg (ref 26.0–34.0)
MCHC: 32.7 g/dL (ref 30.0–36.0)
MCV: 88.7 fL (ref 80.0–100.0)
Monocytes Absolute: 0.8 10*3/uL (ref 0.1–1.0)
Monocytes Relative: 15 %
Neutro Abs: 2.6 10*3/uL (ref 1.7–7.7)
Neutrophils Relative %: 49 %
Platelets: 202 10*3/uL (ref 150–400)
RBC: 4.52 MIL/uL (ref 3.87–5.11)
RDW: 14.3 % (ref 11.5–15.5)
WBC: 5.4 10*3/uL (ref 4.0–10.5)
nRBC: 0 % (ref 0.0–0.2)

## 2022-07-31 LAB — BASIC METABOLIC PANEL
Anion gap: 11 (ref 5–15)
BUN: 15 mg/dL (ref 8–23)
CO2: 26 mmol/L (ref 22–32)
Calcium: 9.3 mg/dL (ref 8.9–10.3)
Chloride: 103 mmol/L (ref 98–111)
Creatinine, Ser: 0.71 mg/dL (ref 0.44–1.00)
GFR, Estimated: >60 mL/min (ref >60)
Glucose, Bld: 137 mg/dL — ABNORMAL HIGH (ref 70–99)
Potassium: 3.6 mmol/L (ref 3.5–5.1)
Sodium: 140 mmol/L (ref 135–145)

## 2022-07-31 LAB — D-DIMER, QUANTITATIVE: D-Dimer, Quant: 0.57 ug/mL-FEU — ABNORMAL HIGH (ref 0.00–0.50)

## 2022-07-31 LAB — TROPONIN I (HIGH SENSITIVITY)
Troponin I (High Sensitivity): 15 ng/L (ref <18)
Troponin I (High Sensitivity): 15 ng/L (ref <18)

## 2022-07-31 LAB — RESP PANEL BY RT-PCR (RSV, FLU A&B, COVID)  RVPGX2
Influenza A by PCR: NEGATIVE
Influenza B by PCR: NEGATIVE
Resp Syncytial Virus by PCR: NEGATIVE
SARS Coronavirus 2 by RT PCR: NEGATIVE

## 2022-07-31 LAB — BRAIN NATRIURETIC PEPTIDE: B Natriuretic Peptide: 36.3 pg/mL (ref 0.0–100.0)

## 2022-07-31 MED ORDER — AEROCHAMBER PLUS FLO-VU LARGE MISC
1.0000 | Freq: Once | Status: AC
  Administered 2022-07-31: 1
  Filled 2022-07-31: qty 1

## 2022-07-31 MED ORDER — DOXYCYCLINE HYCLATE 100 MG PO CAPS: 100.0000 mg | ORAL_CAPSULE | Freq: Two times a day (BID) | ORAL | 0 refills | Status: DC

## 2022-07-31 MED ORDER — IPRATROPIUM-ALBUTEROL 0.5-2.5 (3) MG/3ML IN SOLN: 3.0000 mL | Freq: Once | RESPIRATORY_TRACT | Status: AC

## 2022-07-31 MED ORDER — ALBUTEROL SULFATE HFA 108 (90 BASE) MCG/ACT IN AERS
2.0000 | INHALATION_SPRAY | RESPIRATORY_TRACT | Status: DC | PRN
  Administered 2022-07-31: 2 via RESPIRATORY_TRACT
  Filled 2022-07-31: qty 6.7

## 2022-07-31 MED ORDER — ALBUTEROL SULFATE HFA 108 (90 BASE) MCG/ACT IN AERS: 1.0000 | INHALATION_SPRAY | Freq: Four times a day (QID) | RESPIRATORY_TRACT | 0 refills | Status: AC | PRN

## 2022-07-31 MED ORDER — IPRATROPIUM-ALBUTEROL 0.5-2.5 (3) MG/3ML IN SOLN
RESPIRATORY_TRACT | Status: AC
  Administered 2022-07-31: 3 mL via RESPIRATORY_TRACT
  Filled 2022-07-31: qty 3

## 2022-07-31 MED ORDER — IPRATROPIUM-ALBUTEROL 0.5-2.5 (3) MG/3ML IN SOLN
3.0000 mL | Freq: Once | RESPIRATORY_TRACT | Status: AC
  Administered 2022-07-31: 3 mL via RESPIRATORY_TRACT
  Filled 2022-07-31: qty 3

## 2022-07-31 MED ORDER — PREDNISONE 50 MG PO TABS
60.0000 mg | ORAL_TABLET | Freq: Once | ORAL | Status: AC
  Administered 2022-07-31: 60 mg via ORAL
  Filled 2022-07-31: qty 1

## 2022-07-31 MED ORDER — ALBUTEROL SULFATE (2.5 MG/3ML) 0.083% IN NEBU: 2.5000 mg | INHALATION_SOLUTION | Freq: Once | RESPIRATORY_TRACT | Status: AC

## 2022-07-31 MED ORDER — ALBUTEROL SULFATE (2.5 MG/3ML) 0.083% IN NEBU
INHALATION_SOLUTION | RESPIRATORY_TRACT | Status: AC
  Administered 2022-07-31: 2.5 mg via RESPIRATORY_TRACT
  Filled 2022-07-31: qty 3

## 2022-07-31 MED ORDER — BENZONATATE 100 MG PO CAPS: 100.0000 mg | ORAL_CAPSULE | Freq: Three times a day (TID) | ORAL | 0 refills | Status: DC

## 2022-07-31 NOTE — ED Provider Notes (Signed)
[facility] Provider Note   CSN: [id number] Arrival date & time: 07/31/22  0542     History  Chief Complaint  Patient presents with   Cough    Shannon Oliver is a 62 y.o. female.  Patient with a history of hypertension, diabetes, hyperlipidemia, carotid artery aneurysm status post coiling presenting with a week of nasal congestion, cough, lightheadedness, weakness and chest pain with coughing.  States her cough is relatively clear and yellow mucus.  Her chest hurts only when she coughs.  She saw her PCP 4 days ago and was given prednisone for suspected viral respiratory infection.  She feels weak and short of breath and is continuing to cough.  She has chest pain when she coughs.  Does not think she has had a fever.  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  Denies any abdominal pain, nausea or vomiting.  No diarrhea.  No significant headache.  Denies any thunderclap onset headache.  Denies any focal weakness, numbness or tingling.  Denies any pain with urination or blood in the urine. She is concerned she could have RSV again.  She is not normally use any medications for her breathing is never been hospitalized for her breathing.  Denies any history of COPD but does have asthma.  No history of CHF or CAD.  The history is provided by the patient.  Cough Associated symptoms: chest pain, headaches and rhinorrhea   Associated symptoms: no fever, no myalgias, no rash and no sore throat        Home Medications Prior to Admission medications   Medication Sig Start Date End Date Taking? Authorizing Provider  ACCU-CHEK GUIDE test strip USE AS INSTRUCTED TO CHECK BLOOD SUGAR TWICE DAILY. 05/31/22   Elayne Snare, MD  acetaminophen (TYLENOL) 500 MG tablet Take 1,000 mg by mouth every 6 (six) hours as needed for moderate pain.    [provider]  amLODipine (NORVASC) 10 MG tablet Take 1 tablet by mouth once daily. 07/31/21    Angiulli, Lavon Paganini, PA-C  aspirin EC 81 MG EC tablet Take 1 tablet (81 mg total) by mouth daily. Swallow whole. 07/24/21   Janine Ores, NP  carvedilol (COREG) 25 MG tablet Take 1.5 tablets (37.5 mg total) by mouth 2 (two) times daily with a meal. 07/31/21 11/02/21  Angiulli, Lavon Paganini, PA-C  Continuous Blood Gluc Sensor (DEXCOM G7 SENSOR) MISC 1 DEVICE BY DOES NOT APPLY ROUTE AS DIRECTED. CHANGE SENSOR EVERY 10 DAYS 05/25/22   Elayne Snare, MD  FARXIGA 5 MG TABS tablet TAKE 1 TABLET (5 MG TOTAL) BY MOUTH DAILY. 06/21/22   Elayne Snare, MD  FIASP FLEXTOUCH 100 UNIT/ML FlexTouch Pen INJECT 8 UNITS INTO THE SKIN 3 (THREE) TIMES DAILY BEFORE MEALS. 03/18/22   Elayne Snare, MD  fluticasone (FLONASE) 50 MCG/ACT nasal spray Place 1 spray into both nostrils daily as needed for allergies or rhinitis.    [provider]  insulin glargine, 1 Unit Dial, (TOUJEO SOLOSTAR) 300 UNIT/ML Solostar Pen Inject 14 Units into the skin daily. 12/31/21   Elayne Snare, MD  losartan (COZAAR) 100 MG tablet Take 1 tablet (100 mg total) by mouth daily. 07/31/21   Angiulli, Lavon Paganini, PA-C  losartan (COZAAR) 100 MG tablet take 1 tablet by mouth every day for 90 days    [provider]  MELATONIN PO Take 1 capsule by mouth at bedtime as needed (sleep).    [provider]  metFORMIN (  GLUCOPHAGE) 1000 MG tablet Take 1 tablet (1,000 mg total) by mouth 2 (two) times daily with a meal. Patient taking differently: Take 500-1,000 mg by mouth See admin instructions. Take 1000 mg in the morning and 500 mg in the afternoon 07/31/21   Angiulli, Lavon Paganini, PA-C  Multiple Vitamins-Calcium (ONE-A-DAY WOMENS PO) Take 1 tablet by mouth daily.    [provider]  rosuvastatin (CRESTOR) 40 MG tablet Take 1 tablet (40 mg total) by mouth daily. 07/31/21   Angiulli, Lavon Paganini, PA-C  Sodium Chloride, Hypertonic, (MURO 540 OP) Place 1 application into the left eye daily.    [provider]      Allergies    Canagliflozin  and Hydrocodone    Review of Systems   Review of Systems  Constitutional:  Positive for fatigue. Negative for activity change, appetite change and fever.  HENT:  Positive for congestion and rhinorrhea. Negative for sore throat.   Respiratory:  Positive for cough.   Cardiovascular:  Positive for chest pain.  Gastrointestinal:  Negative for abdominal pain, nausea and vomiting.  Genitourinary:  Negative for dysuria and hematuria.  Musculoskeletal:  Negative for arthralgias and myalgias.  Skin:  Negative for rash.  Neurological:  Positive for headaches. Negative for dizziness and weakness.   all other systems are negative except as noted in the HPI and PMH.    Physical Exam Updated Vital Signs BP (!) 156/87   Pulse 85   Temp 97.8 F (36.6 C) (Oral)   Resp 20   Ht '5\' 6"'$  (1.676 m)   Wt 110.2 kg   LMP 08/02/2013 Comment: spotting  SpO2 100%   BMI 39.22 kg/m  Physical Exam Vitals and nursing note reviewed.  Constitutional:      General: She is not in acute distress.    Appearance: She is well-developed.     Comments: No increased work of breathing  HENT:     Head: Normocephalic and atraumatic.     Nose: Congestion and rhinorrhea present.     Mouth/Throat:     Pharynx: No oropharyngeal exudate.  Eyes:     Conjunctiva/sclera: Conjunctivae normal.     Pupils: Pupils are equal, round, and reactive to light.  Neck:     Comments: No meningismus. Cardiovascular:     Rate and Rhythm: Normal rate and regular rhythm.     Heart sounds: Normal heart sounds. No murmur heard. Pulmonary:     Effort: Pulmonary effort is normal. No respiratory distress.     Breath sounds: Rhonchi present.  Abdominal:     Palpations: Abdomen is soft.     Tenderness: There is no abdominal tenderness. There is no guarding or rebound.  Musculoskeletal:        General: No tenderness. Normal range of motion.     Cervical back: Normal range of motion and neck supple.  Skin:    General: Skin is warm.   Neurological:     Mental Status: She is alert and oriented to person, place, and time.     Cranial Nerves: No cranial nerve deficit.     Motor: No abnormal muscle tone.     Coordination: Coordination normal.     Comments:  5/5 strength throughout. CN 2-12 intact.Equal grip strength.   Psychiatric:        Behavior: Behavior normal.     ED Results / Procedures / Treatments   Labs (all labs ordered are listed, but only abnormal results are displayed) Labs Reviewed  BASIC METABOLIC PANEL -  Abnormal; Notable for the following components:      Result Value   Glucose, Bld 137 (*)    All other components within normal limits  D-DIMER, QUANTITATIVE - Abnormal; Notable for the following components:   D-Dimer, Quant 0.57 (*)    All other components within normal limits  RESP PANEL BY RT-PCR (RSV, FLU A&B, COVID)  [id number]  CBC WITH DIFFERENTIAL/PLATELET  BRAIN NATRIURETIC PEPTIDE  TROPONIN I (HIGH SENSITIVITY)  TROPONIN I (HIGH SENSITIVITY)    EKG EKG Interpretation  Date/Time:  Saturday July 31 2022 11:30:49 EST Ventricular Rate:  78 PR Interval:  150 QRS Duration: 94 QT Interval:  377 QTC Calculation: 430 R Axis:   62 Text Interpretation: Sinus rhythm Anterior infarct, old Baseline wander in lead(s) V1 No significant change was found Confirmed by Ezequiel Essex [phone]) on 07/31/2022 11:32:44 AM  Radiology DG Chest 2 View  Result Date: 07/31/2022 CLINICAL DATA:  Shortness of breath EXAM: CHEST - 2 VIEW COMPARISON:  10/29/2020 FINDINGS: The heart size and mediastinal contours are within normal limits. Both lungs are clear. The visualized skeletal structures are unremarkable. IMPRESSION: No active cardiopulmonary disease. Electronically Signed   By: Davina Poke D.O.   On: 07/31/2022 09:24    Procedures Procedures    Medications Ordered in ED Medications  albuterol (VENTOLIN HFA) 108 (90 Base) MCG/ACT inhaler 2 puff (2 puffs Inhalation Given 07/31/22 0734)   ipratropium-albuterol (DUONEB) 0.5-2.5 (3) MG/3ML nebulizer solution 3 mL (3 mLs Nebulization Given 07/31/22 0607)  albuterol (PROVENTIL) (2.5 MG/3ML) 0.083% nebulizer solution 2.5 mg (2.5 mg Nebulization Given 07/31/22 2947)  AeroChamber Plus Flo-Vu Large MISC 1 each (1 each Other Given 07/31/22 0644)  ipratropium-albuterol (DUONEB) 0.5-2.5 (3) MG/3ML nebulizer solution 3 mL (3 mLs Nebulization Given 07/31/22 0746)    ED Course/ Medical Decision Making/ A&P                             Medical Decision Making Amount and/or Complexity of Data Reviewed Labs: ordered. Decision-making details documented in ED Course. Radiology: ordered and independent interpretation performed. Decision-making details documented in ED Course. ECG/medicine tests: ordered and independent interpretation performed. Decision-making details documented in ED Course.  Risk Prescription drug management.   1 week of nasal congestion and shortness of breath and chest pain with coughing.  No hypoxia or increased work of breathing.  Clear lungs with scattered rhonchi.  COVID and flu swabs are negative in triage  EKG without acute ischemia. Troponin negative. Age adjusted D-dimer negative.  Troponin negative x 2.  Breathing has improved after albuterol and steroids she is able to ambulate without desaturation.  Chest x-ray is negative for pneumonia.  Results reviewed interpreted by me.  No evidence of ACS or pulmonary embolism.  No evidence of CHF.  Discussed likely viral respiratory infection with patient.  Will treat empirically for bronchitis.  Continue prednisone we will add doxycycline and cough suppressants.  Return to ED with exertional chest pain, pain associate with shortness of breath, nausea, vomiting, sweating or other concerns.        Final Clinical Impression(s) / ED Diagnoses Final diagnoses:  Bronchitis    Rx / DC Orders ED Discharge Orders     None         Eira Alpert, Annie Main,  MD 07/31/22 1542

## 2022-07-31 NOTE — ED Notes (Signed)
RT educated pt on proper use of MDI w/spacer. RT suggested pt seek a pulmonology appt since she has never seen one or had a PFT for her asthma/reactive airway symptoms. Pt verbalizes understanding.

## 2022-07-31 NOTE — ED Notes (Signed)
Patient  on room air while @ rest= 100%  Patient on room air while ambulating = 99%  Patient appears to be in no distress post walking or at rest

## 2022-07-31 NOTE — ED Notes (Signed)
Dc instructions reviewed with patient. Patient voiced understanding. Dc with belongings.  °

## 2022-07-31 NOTE — Discharge Instructions (Signed)
Use your albuterol every 4 hours for the next several days continue the antibiotics and steroids.  Follow-up with your doctor.  Return to the ED with difficulty breathing, pain associated with exertion, chest pain, nausea, vomiting, sweating or other concerns.

## 2022-07-31 NOTE — ED Triage Notes (Signed)
Pt arrives to ED POV c/o cough and nasal congestion. Per Pt these symptoms have been going on for over a week and has been causing her to feel weak and SHOB. RT in triage assessing pt.

## 2022-09-03 DIAGNOSIS — J329 Chronic sinusitis, unspecified: Secondary | ICD-10-CM | POA: Diagnosis not present

## 2022-09-03 DIAGNOSIS — J45909 Unspecified asthma, uncomplicated: Secondary | ICD-10-CM | POA: Diagnosis not present

## 2022-09-14 ENCOUNTER — Other Ambulatory Visit: Payer: Self-pay | Admitting: Family Medicine

## 2022-09-14 ENCOUNTER — Ambulatory Visit: Payer: Self-pay

## 2022-09-14 DIAGNOSIS — S46912A Strain of unspecified muscle, fascia and tendon at shoulder and upper arm level, left arm, initial encounter: Secondary | ICD-10-CM

## 2022-09-16 ENCOUNTER — Other Ambulatory Visit: Payer: Self-pay | Admitting: Endocrinology

## 2022-09-17 ENCOUNTER — Other Ambulatory Visit: Payer: Self-pay | Admitting: Endocrinology

## 2022-09-17 DIAGNOSIS — E1165 Type 2 diabetes mellitus with hyperglycemia: Secondary | ICD-10-CM

## 2022-09-20 ENCOUNTER — Other Ambulatory Visit: Payer: Self-pay

## 2022-09-20 ENCOUNTER — Telehealth: Payer: Self-pay

## 2022-09-20 NOTE — Telephone Encounter (Signed)
Message received from [facility]. PLEASE CONSIDER CHANGING TO ONE OF THE SUGGESTED COVERED ALTERNATIVES.

## 2022-09-21 MED ORDER — DAPAGLIFLOZIN PROPANEDIOL 5 MG PO TABS: ORAL_TABLET | ORAL | 2 refills | Status: DC

## 2022-09-21 NOTE — Telephone Encounter (Signed)
RX sent for Farxiga 5 mg daily

## 2022-10-29 ENCOUNTER — Other Ambulatory Visit: Payer: Self-pay | Admitting: Endocrinology

## 2022-10-31 ENCOUNTER — Telehealth: Payer: Self-pay

## 2022-10-31 NOTE — Telephone Encounter (Signed)
Request from pharmacy to changed [place] to [place].

## 2022-11-01 NOTE — Telephone Encounter (Signed)
Per pharmacy requires PA

## 2022-11-01 NOTE — Telephone Encounter (Signed)
Patient calling to get update on status of Farxiga PA. Per chart med has been changed to [place], which still requires a PA per pharmacy.   Please advise, thanks!

## 2022-11-02 ENCOUNTER — Other Ambulatory Visit (HOSPITAL_COMMUNITY): Payer: Self-pay

## 2022-11-03 NOTE — Telephone Encounter (Signed)
LVM for patient to return call. 

## 2022-11-08 NOTE — Telephone Encounter (Signed)
LMTRC

## 2022-11-09 NOTE — Telephone Encounter (Signed)
LMOM for patient to return call to [phone]

## 2022-11-09 NOTE — Telephone Encounter (Signed)
Patient states that she has [employer]. She will bring by the new cards in the morning

## 2022-11-10 ENCOUNTER — Other Ambulatory Visit (HOSPITAL_COMMUNITY): Payer: Self-pay

## 2022-11-10 ENCOUNTER — Telehealth: Payer: Self-pay

## 2022-11-10 DIAGNOSIS — E1165 Type 2 diabetes mellitus with hyperglycemia: Secondary | ICD-10-CM

## 2022-11-10 NOTE — Telephone Encounter (Signed)
Patient Advocate Encounter   Received notification from pt msgs that prior authorization is required for Marcelline Deist  Submitted: 11/10/22 Key [id number]  Status is pending

## 2022-11-10 NOTE — Telephone Encounter (Signed)
Insurance has been added and scanned

## 2022-11-12 NOTE — Telephone Encounter (Signed)
[facility], the utilization review entity for [employer] [facility] [place] [facility], received a request for coverage of Farxiga 5MG  OR TABS for you. We've denied the request for the following reason(s): Coverage for this medication is denied for the following reason(s). We reviewed the information we received about your condition and circumstances. We used the plan-approved policy when making this decision. The policy states that this medication may be approved when: -The member has a clinical condition or needs a specific dosage form for which there is no alternative on the formulary OR -The listed formulary alternatives are not recommended based on published guidelines or clinical literature OR -The formulary alternatives will likely be ineffective or less effective for the member OR -The formulary alternatives will likely cause an adverse effect OR -The member is unable to take the required number of formulary alternatives for the given diagnosis due to a trial and inadequate treatment response or contraindication OR -The member has tried and failed the required number of formulary alternatives. Based on the policy and the information we have, your request is denied. We did not receive any documentation that you meet any of the criteria outlined above. Formulary alternative(s) are [employer]. Requirement: 3 in a class with 3 or more alternatives, 2 in a class with 2 alternatives, or 1 in a class with only 1 alternative. Please refer to your plan documents for a complete list of alternatives. Note: Formulary alternatives may require a prior authorization. Your prescriber will be responsible for determining what alternative is appropriate for you.

## 2022-11-16 MED ORDER — EMPAGLIFLOZIN 10 MG PO TABS: 10.0000 mg | ORAL_TABLET | Freq: Every day | ORAL | 0 refills | Status: DC

## 2022-11-16 NOTE — Addendum Note (Signed)
Addended by: Kenyon Ana on: 11/16/2022 10:58 AM   Modules accepted: Orders

## 2022-11-16 NOTE — Telephone Encounter (Signed)
Rx sent 

## 2022-11-24 ENCOUNTER — Other Ambulatory Visit: Payer: Self-pay | Admitting: Endocrinology

## 2022-11-24 DIAGNOSIS — H6692 Otitis media, unspecified, left ear: Secondary | ICD-10-CM | POA: Diagnosis not present

## 2022-11-24 DIAGNOSIS — H6690 Otitis media, unspecified, unspecified ear: Secondary | ICD-10-CM | POA: Diagnosis not present

## 2022-11-24 DIAGNOSIS — E1165 Type 2 diabetes mellitus with hyperglycemia: Secondary | ICD-10-CM | POA: Diagnosis not present

## 2022-12-24 DIAGNOSIS — Z8673 Personal history of transient ischemic attack (TIA), and cerebral infarction without residual deficits: Secondary | ICD-10-CM | POA: Diagnosis not present

## 2022-12-24 DIAGNOSIS — E1165 Type 2 diabetes mellitus with hyperglycemia: Secondary | ICD-10-CM | POA: Diagnosis not present

## 2022-12-24 DIAGNOSIS — I10 Essential (primary) hypertension: Secondary | ICD-10-CM | POA: Diagnosis not present

## 2022-12-24 DIAGNOSIS — G4733 Obstructive sleep apnea (adult) (pediatric): Secondary | ICD-10-CM | POA: Diagnosis not present

## 2022-12-24 DIAGNOSIS — R413 Other amnesia: Secondary | ICD-10-CM | POA: Diagnosis not present

## 2022-12-24 DIAGNOSIS — G8929 Other chronic pain: Secondary | ICD-10-CM | POA: Diagnosis not present

## 2022-12-30 ENCOUNTER — Other Ambulatory Visit: Payer: 59

## 2023-01-03 ENCOUNTER — Ambulatory Visit: Payer: 59 | Admitting: Endocrinology

## 2023-01-13 DIAGNOSIS — J45909 Unspecified asthma, uncomplicated: Secondary | ICD-10-CM | POA: Diagnosis not present

## 2023-01-13 DIAGNOSIS — E1142 Type 2 diabetes mellitus with diabetic polyneuropathy: Secondary | ICD-10-CM | POA: Diagnosis not present

## 2023-01-13 DIAGNOSIS — E11620 Type 2 diabetes mellitus with diabetic dermatitis: Secondary | ICD-10-CM | POA: Diagnosis not present

## 2023-01-13 DIAGNOSIS — E785 Hyperlipidemia, unspecified: Secondary | ICD-10-CM | POA: Diagnosis not present

## 2023-01-13 DIAGNOSIS — Z6841 Body Mass Index (BMI) 40.0 and over, adult: Secondary | ICD-10-CM | POA: Diagnosis not present

## 2023-01-13 DIAGNOSIS — Z8673 Personal history of transient ischemic attack (TIA), and cerebral infarction without residual deficits: Secondary | ICD-10-CM | POA: Diagnosis not present

## 2023-01-13 DIAGNOSIS — R45851 Suicidal ideations: Secondary | ICD-10-CM | POA: Diagnosis not present

## 2023-01-13 DIAGNOSIS — I10 Essential (primary) hypertension: Secondary | ICD-10-CM | POA: Diagnosis not present

## 2023-01-13 DIAGNOSIS — Z809 Family history of malignant neoplasm, unspecified: Secondary | ICD-10-CM | POA: Diagnosis not present

## 2023-01-13 DIAGNOSIS — R32 Unspecified urinary incontinence: Secondary | ICD-10-CM | POA: Diagnosis not present

## 2023-01-13 DIAGNOSIS — F321 Major depressive disorder, single episode, moderate: Secondary | ICD-10-CM | POA: Diagnosis not present

## 2023-03-31 ENCOUNTER — Other Ambulatory Visit (INDEPENDENT_AMBULATORY_CARE_PROVIDER_SITE_OTHER): Payer: 59

## 2023-03-31 DIAGNOSIS — Z794 Long term (current) use of insulin: Secondary | ICD-10-CM

## 2023-03-31 DIAGNOSIS — E1165 Type 2 diabetes mellitus with hyperglycemia: Secondary | ICD-10-CM

## 2023-04-01 LAB — HEMOGLOBIN A1C: Hgb A1c MFr Bld: 11.1 % — ABNORMAL HIGH (ref 4.6–6.5)

## 2023-04-01 LAB — BASIC METABOLIC PANEL
BUN: 12 mg/dL (ref 6–23)
CO2: 27 meq/L (ref 19–32)
Calcium: 8.9 mg/dL (ref 8.4–10.5)
Chloride: 107 meq/L (ref 96–112)
Creatinine, Ser: 0.95 mg/dL (ref 0.40–1.20)
GFR: 64.41 mL/min (ref >60.00)
Glucose, Bld: 159 mg/dL — ABNORMAL HIGH (ref 70–99)
Potassium: 3.6 meq/L (ref 3.5–5.1)
Sodium: 143 meq/L (ref 135–145)

## 2023-04-07 ENCOUNTER — Ambulatory Visit: Payer: 59 | Admitting: "Endocrinology

## 2023-04-07 ENCOUNTER — Encounter: Payer: Self-pay | Admitting: "Endocrinology

## 2023-04-07 VITALS — BP 160/93 | HR 77 | Ht 66.0 in | Wt 255.2 lb

## 2023-04-07 DIAGNOSIS — E7800 Pure hypercholesterolemia, unspecified: Secondary | ICD-10-CM

## 2023-04-07 DIAGNOSIS — E1165 Type 2 diabetes mellitus with hyperglycemia: Secondary | ICD-10-CM

## 2023-04-07 DIAGNOSIS — Z794 Long term (current) use of insulin: Secondary | ICD-10-CM

## 2023-04-07 MED ORDER — DEXCOM G7 SENSOR MISC: 1.0000 | 0 refills | Status: DC

## 2023-04-07 MED ORDER — GABAPENTIN 100 MG PO CAPS: 100.0000 mg | ORAL_CAPSULE | Freq: Three times a day (TID) | ORAL | 3 refills | Status: DC

## 2023-04-07 NOTE — Progress Notes (Signed)
Outpatient Endocrinology Note Shannon Bayard, MD  04/07/23   Sequana Boynes Oliver 20-Jun-1961 [id number]  Referring Provider: Lorenda Ishihara,* Primary Care Provider: Lorenda Ishihara, MD Reason for consultation: Subjective   Assessment & Plan  Diagnoses and all orders for this visit:  Uncontrolled type 2 diabetes mellitus with hyperglycemia, with long-term current use of insulin (HCC) -     Microalbumin / creatinine urine ratio; Future -     Lipid panel; Future -     Hepatic function panel; Future  Long-term insulin use (HCC)  Pure hypercholesterolemia  Other orders -     Continuous Glucose Sensor (DEXCOM G7 SENSOR) MISC; 1 Device by Does not apply route continuous. -     gabapentin (NEURONTIN) 100 MG capsule; Take 1 capsule (100 mg total) by mouth 3 (three) times daily.    Diabetes Type II complicated by neuropathy Lab Results  Component Value Date   GFR 64.41 03/31/2023   Hba1c goal less than 7, current Hba1c is  Lab Results  Component Value Date   HGBA1C 11.1 (H) 03/31/2023   Will recommend the following: On Toujeo 14 units qam. Increase it by 2 units every day until fasting blood sugar is less than 150. Stay on that dose.   Insulin 70/30 10 units 20 min before dinner  Metformin 500mg  1-2 times a day Start checking BG tidac/stat dexcom and bring that data adjust insulin next visit  Ordered fasting labs Start gabapentin for neuropathy  No known contraindications/side effects to any of above medications  -Last LD and Tg are as follows: Lab Results  Component Value Date   LDLCALC 69 07/21/2021    Lab Results  Component Value Date   TRIG 53 07/21/2021   -rosuvastatin 40 mg QD -Follow low fat diet and exercise   -Blood pressure goal <140/90 - Microalbumin/creatinine goal is < 30 -Last MA/Cr is as follows: Lab Results  Component Value Date   MICROALBUR 4.3 (H) 12/21/2021   - on ACE/ARB losartan 100 mg qd -diet changes including salt  restriction -limit eating outside -counseled BP targets per standards of diabetes care -uncontrolled blood pressure can lead to retinopathy, nephropathy and cardiovascular and atherosclerotic heart disease  Reviewed and counseled on: -A1C target -Blood sugar targets -Complications of uncontrolled diabetes  -Checking blood sugar before meals and bedtime and bring log next visit -All medications with mechanism of action and side effects -Hypoglycemia management: rule of 15's, Glucagon Emergency Kit and medical alert ID -low-carb low-fat plate-method diet -At least 20 minutes of physical activity per day -Annual dilated retinal eye exam and foot exam -compliance and follow up needs -follow up as scheduled or earlier if problem gets worse  Call if blood sugar is less than 70 or consistently above 250    Take a 15 gm snack of carbohydrate at bedtime before you go to sleep if your blood sugar is less than 100.    If you are going to fast after midnight for a test or procedure, ask your physician for instructions on how to reduce/decrease your insulin dose.    Call if blood sugar is less than 70 or consistently above 250  -Treating a low sugar by rule of 15  (15 gms of sugar every 15 min until sugar is more than 70) If you feel your sugar is low, test your sugar to be sure If your sugar is low (less than 70), then take 15 grams of a fast acting Carbohydrate (3-4 glucose tablets or glucose gel  or 4 ounces of juice or regular soda) Recheck your sugar 15 min after treating low to make sure it is more than 70 If sugar is still less than 70, treat again with 15 grams of carbohydrate          Don't drive the hour of hypoglycemia  If unconscious/unable to eat or drink by mouth, use glucagon injection or nasal spray baqsimi and call 911. Can repeat again in 15 min if still unconscious.  Return in about 25 days (around 05/02/2023) for visit, fasting labs before next visit.   I have reviewed  current medications, nurse's notes, allergies, vital signs, past medical and surgical history, family medical history, and social history for this encounter. Counseled patient on symptoms, examination findings, lab findings, imaging results, treatment decisions and monitoring and prognosis. The patient understood the recommendations and agrees with the treatment plan. All questions regarding treatment plan were fully answered.  Shannon Ramos, MD  04/07/23    History of Present Illness Shannon Oliver is a 62 y.o. year old female who presents for evaluation of Type II diabetes mellitus.  Consuello Underhill Oliver was first diagnosed in 2008.   Diabetes education +  Home diabetes regimen: Insulin 70/30 10 units before dinner  Metformin 500mg  1-2 times a day Toujeo 14 units qam  Couldn't get fiasp 8 units tidac/ farxiga/jardiance 10 mg every day  Background history:   She thinks she has been on mostly oral hypoglycemic drugs notably metformin and Amaryl for most of the duration of her diabetes She was probably started on insulin in 2018 with Levemir insulin when her A1c was 9.6 She has not had an endocrinology follow-up since 03/2017  COMPLICATIONS +  MI/Stroke +  retinopathy +  neuropathy -  nephropathy  BLOOD SUGAR DATA Doesn't check BG except rarely  No data in meter  Physical Exam  BP (!) 160/93   Pulse 77   Ht 5\' 6"  (1.676 m)   Wt 255 lb 3.2 oz (115.8 kg)   LMP 08/02/2013 Comment: spotting  SpO2 96%   BMI 41.19 kg/m    Constitutional: well developed, well nourished Head: normocephalic, atraumatic Eyes: sclera anicteric, no redness Neck: supple Lungs: normal respiratory effort Neurology: alert and oriented Skin: dry, no appreciable rashes Musculoskeletal: no appreciable defects Psychiatric: normal mood and affect Diabetic Foot Exam - Simple   Simple Foot Form Diabetic Foot exam was performed with the following findings: Yes 04/07/2023  3:06 PM  Visual Inspection No  deformities, no ulcerations, no other skin breakdown bilaterally: Yes Sensation Testing Intact to touch and monofilament testing bilaterally: Yes Pulse Check Posterior Tibialis and Dorsalis pulse intact bilaterally: Yes Comments 2/3 monofilament B/L       Current Medications Patient's Medications  New Prescriptions   CONTINUOUS GLUCOSE SENSOR (DEXCOM G7 SENSOR) MISC    1 Device by Does not apply route continuous.   GABAPENTIN (NEURONTIN) 100 MG CAPSULE    Take 1 capsule (100 mg total) by mouth 3 (three) times daily.  Previous Medications   ACCU-CHEK GUIDE TEST STRIP    USE AS INSTRUCTED TO CHECK BLOOD SUGAR TWICE DAILY.   ACETAMINOPHEN (TYLENOL) 500 MG TABLET    Take 1,000 mg by mouth every 6 (six) hours as needed for moderate pain.   ALBUTEROL (VENTOLIN HFA) 108 (90 BASE) MCG/ACT INHALER    Inhale 1-2 puffs into the lungs every 6 (six) hours as needed for wheezing or shortness of breath.   AMLODIPINE (NORVASC) 10 MG TABLET  Take 1 tablet by mouth once daily.   ASPIRIN EC 81 MG EC TABLET    Take 1 tablet (81 mg total) by mouth daily. Swallow whole.   BENZONATATE (TESSALON) 100 MG CAPSULE    Take 1 capsule (100 mg total) by mouth every 8 (eight) hours.   CARVEDILOL (COREG) 25 MG TABLET    Take 1.5 tablets (37.5 mg total) by mouth 2 (two) times daily with a meal.   CONTINUOUS BLOOD GLUC SENSOR (DEXCOM G7 SENSOR) MISC    1 DEVICE BY DOES NOT APPLY ROUTE AS DIRECTED. CHANGE SENSOR EVERY 10 DAYS   DOXYCYCLINE (VIBRAMYCIN) 100 MG CAPSULE    Take 1 capsule (100 mg total) by mouth 2 (two) times daily.   EMPAGLIFLOZIN (JARDIANCE) 10 MG TABS TABLET    Take 1 tablet (10 mg total) by mouth daily before breakfast.   FLUTICASONE (FLONASE) 50 MCG/ACT NASAL SPRAY    Place 1 spray into both nostrils daily as needed for allergies or rhinitis.   INSULIN ASPART (FIASP FLEXTOUCH) 100 UNIT/ML FLEXTOUCH PEN    INJECT 8 UNITS INTO THE SKIN 3 (THREE) TIMES DAILY BEFORE MEALS.   INSULIN GLARGINE, 1 UNIT DIAL,  (TOUJEO SOLOSTAR) 300 UNIT/ML SOLOSTAR PEN    INJECT 14 UNITS INTO THE SKIN DAILY.   LOSARTAN (COZAAR) 100 MG TABLET    Take 1 tablet (100 mg total) by mouth daily.   MELATONIN PO    Take 1 capsule by mouth at bedtime as needed (sleep).   METFORMIN (GLUCOPHAGE) 1000 MG TABLET    Take 1 tablet (1,000 mg total) by mouth 2 (two) times daily with a meal.   MULTIPLE VITAMINS-CALCIUM (ONE-A-DAY WOMENS PO)    Take 1 tablet by mouth daily.   ROSUVASTATIN (CRESTOR) 40 MG TABLET    Take 1 tablet (40 mg total) by mouth daily.   SODIUM CHLORIDE, HYPERTONIC, (MURO 128 OP)    Place 1 application into the left eye daily.  Modified Medications   No medications on file  Discontinued Medications   LOSARTAN (COZAAR) 100 MG TABLET    take 1 tablet by mouth every day for 90 days    Allergies Allergies  Allergen Reactions   Canagliflozin     dizziness, stabbing pain in right side of body   Hydrocodone Hypertension    Past Medical History Past Medical History:  Diagnosis Date   Abnormal EKG    LVH with strain   Acid reflux    Asthma    Depression    Diabetes mellitus    A1C over 9   HLD (hyperlipidemia)    Hypertension    Noncompliance    Obesity    Pneumonia    Sleep apnea     Past Surgical History Past Surgical History:  Procedure Laterality Date   CARDIAC CATHETERIZATION     CATARACT EXTRACTION Left 06/04/2021   COLONOSCOPY WITH PROPOFOL N/A 04/29/2015   Procedure: COLONOSCOPY WITH PROPOFOL;  Surgeon: Charolett Bumpers, MD;  Location: [facility];  Service: Endoscopy;  Laterality: N/A;   EYE SURGERY     HYSTEROSCOPY WITH D & C N/A 09/07/2017   Procedure: DILATATION AND CURETTAGE /HYSTEROSCOPY;  Surgeon: Conan Bowens, MD;  Location: [facility];  Service: Gynecology;  Laterality: N/A;   IR ANGIO INTRA EXTRACRAN SEL COM CAROTID INNOMINATE UNI R MOD SED  06/18/2021   IR ANGIO INTRA EXTRACRAN SEL INTERNAL CAROTID BILAT MOD SED  08/19/2021   IR ANGIO INTRA EXTRACRAN SEL  INTERNAL CAROTID UNI  L MOD SED  06/18/2021   IR ANGIO INTRA EXTRACRAN SEL INTERNAL CAROTID UNI R MOD SED  07/20/2021   IR ANGIO VERTEBRAL SEL VERTEBRAL UNI R MOD SED  06/18/2021   IR ANGIO VERTEBRAL SEL VERTEBRAL UNI R MOD SED  08/19/2021   IR ANGIOGRAM FOLLOW UP STUDY  07/20/2021   IR CT HEAD LTD  07/20/2021   IR RADIOLOGIST EVAL & MGMT  06/19/2021   IR TRANSCATH/EMBOLIZ  07/20/2021   IR US GUIDE VASC ACCESS RIGHT  06/18/2021   IR US GUIDE VASC ACCESS RIGHT  08/19/2021   KNEE ARTHROSCOPY W/ MENISCAL REPAIR Right    RADIOLOGY WITH ANESTHESIA N/A 07/20/2021   Procedure: IR WITH ANESTHESIA;  Surgeon: Baldemar Lenis, MD;  Location: [facility];  Service: Radiology;  Laterality: N/A;    Family History family history includes Breast cancer in her cousin and maternal aunt; Cancer in her mother; Diabetes in her mother; Hypertension in her mother.  Social History Social History   Socioeconomic History   Marital status: Married    Spouse name: Not on file   Number of children: Not on file   Years of education: Not on file   Highest education level: Not on file  Occupational History   Not on file  Tobacco Use   Smoking status: Never   Smokeless tobacco: Never  Vaping Use   Vaping status: Never Used  Substance and Sexual Activity   Alcohol use: Not Currently    Comment: occ   Drug use: No   Sexual activity: Not on file  Other Topics Concern   Not on file  Social History Narrative   ** Merged History Encounter **       Social Determinants of Health   Financial Resource Strain: Not on file  Food Insecurity: Not on file  Transportation Needs: Not on file  Physical Activity: Not on file  Stress: Not on file  Social Connections: Unknown (11/15/2021)   [facility] from [facility], [facility]   Social Network    Social Network: Not on file  Intimate Partner Violence: Unknown (10/07/2021)   Received from [employer], [facility]   HITS    Physically Hurt: Not on file     Insult or Talk Down To: Not on file    Threaten Physical Harm: Not on file    Scream or Curse: Not on file    Lab Results  Component Value Date   HGBA1C 11.1 (H) 03/31/2023   HGBA1C 8.6 (H) 03/23/2022   HGBA1C 9.5 (H) 12/21/2021   Lab Results  Component Value Date   CHOL 129 07/21/2021   Lab Results  Component Value Date   HDL 49 07/21/2021   Lab Results  Component Value Date   LDLCALC 69 07/21/2021   Lab Results  Component Value Date   TRIG 53 07/21/2021   Lab Results  Component Value Date   CHOLHDL 2.6 07/21/2021   Lab Results  Component Value Date   CREATININE 0.95 03/31/2023   Lab Results  Component Value Date   GFR 64.41 03/31/2023   Lab Results  Component Value Date   MICROALBUR 4.3 (H) 12/21/2021      Component Value Date/Time   NA 143 03/31/2023 1408   K 3.6 03/31/2023 1408   CL 107 03/31/2023 1408   CO2 27 03/31/2023 1408   GLUCOSE 159 (H) 03/31/2023 1408   BUN 12 03/31/2023 1408   CREATININE 0.95 03/31/2023 1408   CREATININE 1.00 10/16/2013 1759  CALCIUM 8.9 03/31/2023 1408   PROT 6.8 07/24/2021 0536   ALBUMIN 3.5 07/24/2021 0536   AST 18 07/24/2021 0536   ALT 19 07/24/2021 0536   ALKPHOS 40 07/24/2021 0536   BILITOT 0.3 07/24/2021 0536   GFRNONAA >60 07/31/2022 0759   GFRAA >60 08/09/2018 1017      Latest Ref Rng & Units 03/31/2023    2:08 PM 07/31/2022    7:59 AM 03/23/2022    1:16 PM  BMP  Glucose 70 - 99 mg/dL 324  401  027   BUN 6 - 23 mg/dL 12  15  16    Creatinine 0.40 - 1.20 mg/dL 2.53  6.64  4.03   Sodium 135 - 145 mEq/L 143  140  141   Potassium 3.5 - 5.1 mEq/L 3.6  3.6  3.7   Chloride 96 - 112 mEq/L 107  103  107   CO2 19 - 32 mEq/L 27  26  26    Calcium 8.4 - 10.5 mg/dL 8.9  9.3  9.4        Component Value Date/Time   WBC 5.4 07/31/2022 0759   RBC 4.52 07/31/2022 0759   HGB 13.1 07/31/2022 0759   HGB 12.0 05/30/2017 1858   HCT 40.1 07/31/2022 0759   HCT 36.3 05/30/2017 1858   PLT 202 07/31/2022 0759   PLT 248  05/30/2017 1858   MCV 88.7 07/31/2022 0759   MCV 90 05/30/2017 1858   MCH 29.0 07/31/2022 0759   MCHC 32.7 07/31/2022 0759   RDW 14.3 07/31/2022 0759   RDW 13.7 05/30/2017 1858   LYMPHSABS 1.7 07/31/2022 0759   LYMPHSABS 3.2 (H) 05/30/2017 1858   MONOABS 0.8 07/31/2022 0759   EOSABS 0.2 07/31/2022 0759   EOSABS 0.2 05/30/2017 1858   BASOSABS 0.1 07/31/2022 0759   BASOSABS 0.0 05/30/2017 1858     Parts of this note may have been dictated using voice recognition software. There may be variances in spelling and vocabulary which are unintentional. Not all errors are proofread. Please notify the Thereasa Parkin if any discrepancies are noted or if the meaning of any statement is not clear.

## 2023-04-29 ENCOUNTER — Other Ambulatory Visit (INDEPENDENT_AMBULATORY_CARE_PROVIDER_SITE_OTHER): Payer: 59

## 2023-04-29 DIAGNOSIS — Z794 Long term (current) use of insulin: Secondary | ICD-10-CM

## 2023-04-29 DIAGNOSIS — E1165 Type 2 diabetes mellitus with hyperglycemia: Secondary | ICD-10-CM

## 2023-04-29 LAB — MICROALBUMIN / CREATININE URINE RATIO
Creatinine,U: 227.9 mg/dL
Microalb Creat Ratio: 27.3 mg/g (ref 0.0–30.0)
Microalb, Ur: 62.2 mg/dL — ABNORMAL HIGH (ref 0.0–1.9)

## 2023-04-29 LAB — HEPATIC FUNCTION PANEL
ALT: 27 U/L (ref 0–35)
AST: 20 U/L (ref 0–37)
Albumin: 4.3 g/dL (ref 3.5–5.2)
Alkaline Phosphatase: 63 U/L (ref 39–117)
Bilirubin, Direct: 0.1 mg/dL (ref 0.0–0.3)
Total Bilirubin: 0.4 mg/dL (ref 0.2–1.2)
Total Protein: 7.4 g/dL (ref 6.0–8.3)

## 2023-04-29 LAB — LIPID PANEL
Cholesterol: 159 mg/dL (ref 0–200)
HDL: 53.6 mg/dL (ref >39.00)
LDL Cholesterol: 83 mg/dL (ref 0–99)
NonHDL: 105.64
Total CHOL/HDL Ratio: 3
Triglycerides: 113 mg/dL (ref 0.0–149.0)
VLDL: 22.6 mg/dL (ref 0.0–40.0)

## 2023-05-03 ENCOUNTER — Ambulatory Visit: Payer: 59 | Admitting: "Endocrinology

## 2023-05-03 ENCOUNTER — Encounter: Payer: Self-pay | Admitting: "Endocrinology

## 2023-05-03 VITALS — BP 150/100 | HR 75 | Ht 66.0 in | Wt 254.4 lb

## 2023-05-03 DIAGNOSIS — Z794 Long term (current) use of insulin: Secondary | ICD-10-CM

## 2023-05-03 DIAGNOSIS — E7800 Pure hypercholesterolemia, unspecified: Secondary | ICD-10-CM | POA: Diagnosis not present

## 2023-05-03 DIAGNOSIS — Z7984 Long term (current) use of oral hypoglycemic drugs: Secondary | ICD-10-CM | POA: Diagnosis not present

## 2023-05-03 DIAGNOSIS — E1165 Type 2 diabetes mellitus with hyperglycemia: Secondary | ICD-10-CM | POA: Diagnosis not present

## 2023-05-03 MED ORDER — METFORMIN HCL ER 500 MG PO TB24: 500.0000 mg | ORAL_TABLET | Freq: Two times a day (BID) | ORAL | 2 refills | Status: DC

## 2023-05-03 NOTE — Progress Notes (Signed)
Outpatient Endocrinology Note [place], [place]  05/03/23   Shannon [place] [birth year] [id number]  Referring Provider: Lorenda [place],* Primary Care Provider: Lorenda Ishih[place], [place] Reason for consultation: Subjective   Assessment & Plan  Diagnoses and all orders for this visit:  Uncontrolled type 2 diabetes mellitus with hyperglycemia, with long-term current use of insulin (HCC)  Long-term insulin use (HCC)  Long term (current) use of oral hypoglycemic drugs  Pure hypercholesterolemia  Other orders -     metFORMIN (GLUCOPHAGE-XR) 500 MG 24 hr tablet; Take 1 tablet (500 mg total) by mouth 2 (two) times daily with a meal.     Diabetes Type II complicated by neuropathy Lab Results  Component Value Date   GFR 64.41 03/31/2023   Hba1c goal less than 7, current Hba1c is  Lab Results  Component Value Date   HGBA1C 11.1 (H) 03/31/2023   Will recommend the following: On Toujeo 22 units qam  Fiasp 10-12 units before meals  Stop Insulin 70/30  Metformin XR 500mg  upto 4 pills a day Gabapentin for neuropathy  No known contraindications/side effects to any of above medications  -Last LD and Tg are as follows: Lab Results  Component Value Date   LDLCALC 83 04/29/2023    Lab Results  Component Value Date   TRIG 113.0 04/29/2023   -rosuvastatin 40 mg QD -Follow low fat diet and exercise   -Blood pressure goal <140/90 - Microalbumin/creatinine goal is < 30 -Last MA/Cr is as follows: Lab Results  Component Value Date   MICROALBUR 62.2 (H) 04/29/2023   - on ACE/ARB losartan 100 mg qd -diet changes including salt restriction -limit eating outside -counseled BP targets per standards of diabetes care -uncontrolled blood pressure can lead to retinopathy, nephropathy and cardiovascular and atherosclerotic heart disease  Reviewed and counseled on: -A1C target -Blood sugar targets -Complications of uncontrolled diabetes  -Checking blood sugar  before meals and bedtime and bring log next visit -All medications with mechanism of action and side effects -Hypoglycemia management: rule of 15's, Glucagon Emergency Kit and medical alert ID -low-carb low-fat plate-method diet -At least 20 minutes of physical activity per day -Annual dilated retinal eye exam and foot exam -compliance and follow up needs -follow up as scheduled or earlier if problem gets worse  Call if blood sugar is less than 70 or consistently above 250    Take a 15 gm snack of carbohydrate at bedtime before you go to sleep if your blood sugar is less than 100.    If you are going to fast after midnight for a test or procedure, ask your physician for instructions on how to reduce/decrease your insulin dose.    Call if blood sugar is less than 70 or consistently above 250  -Treating a low sugar by rule of 15  (15 gms of sugar every 15 min until sugar is more than 70) If you feel your sugar is low, test your sugar to be sure If your sugar is low (less than 70), then take 15 grams of a fast acting Carbohydrate (3-4 glucose tablets or glucose gel or 4 ounces of juice or regular soda) Recheck your sugar 15 min after treating low to make sure it is more than 70 If sugar is still less than 70, treat again with 15 grams of carbohydrate          Don't drive the hour of hypoglycemia  If unconscious/unable to eat or drink by mouth, use glucagon injection or nasal spray  baqsimi and call 911. Can repeat again in 15 min if still unconscious.  No follow-ups on file.   I have reviewed current medications, nurse's notes, allergies, vital signs, past medical and surgical history, family medical history, and social history for this encounter. Counseled patient on symptoms, examination findings, lab findings, imaging results, treatment decisions and monitoring and prognosis. The patient understood the recommendations and agrees with the treatment plan. All questions regarding treatment  plan were fully answered.  Shannon [place], [place]  05/03/23    History of Present Illness Shannon Oliver is a 62 y.o. year old female who presents for evaluation of Type II diabetes mellitus.  Shannon Oliver was first diagnosed in 2008.   Diabetes education +  Home diabetes regimen: Insulin 70/30 or fiasp 10-12 units bidac Metformin 500mg  1 time a day Toujeo 18 units qam  Couldn't get fiasp 8 units tidac/ farxiga/jardiance 10 mg every day  Background history:   She thinks she has been on mostly oral hypoglycemic drugs notably metformin and Amaryl for most of the duration of her diabetes She was probably started on insulin in 2018 with Levemir insulin when her A1c was 9.6 She has not had an endocrinology follow-up since 03/2017  COMPLICATIONS +  MI/Stroke +  retinopathy +  neuropathy -  nephropathy  BLOOD SUGAR DATA 155-254   Physical Exam  BP (!) 150/100   Pulse 75   Ht 5\' 6"  (1.676 m)   Wt 254 lb 6.4 oz (115.4 kg)   LMP 08/02/2013 Comment: spotting  SpO2 95%   BMI 41.06 kg/m    Constitutional: well developed, well nourished Head: normocephalic, atraumatic Eyes: sclera anicteric, no redness Neck: supple Lungs: normal respiratory effort Neurology: alert and oriented Skin: dry, no appreciable rashes Musculoskeletal: no appreciable defects Psychiatric: normal mood and affect Diabetic Foot Exam - Simple   No data filed      Current Medications Patient's Medications  New Prescriptions   METFORMIN (GLUCOPHAGE-XR) 500 MG 24 HR TABLET    Take 1 tablet (500 mg total) by mouth 2 (two) times daily with a meal.  Previous Medications   ACCU-CHEK GUIDE TEST STRIP    USE AS INSTRUCTED TO CHECK BLOOD SUGAR TWICE DAILY.   ACETAMINOPHEN (TYLENOL) 500 MG TABLET    Take 1,000 mg by mouth every 6 (six) hours as needed for moderate pain.   ALBUTEROL (VENTOLIN HFA) 108 (90 BASE) MCG/ACT INHALER    Inhale 1-2 puffs into the lungs every 6 (six) hours as needed for wheezing or  shortness of breath.   AMLODIPINE (NORVASC) 10 MG TABLET    Take 1 tablet by mouth once daily.   ASPIRIN EC 81 MG EC TABLET    Take 1 tablet (81 mg total) by mouth daily. Swallow whole.   BENZONATATE (TESSALON) 100 MG CAPSULE    Take 1 capsule (100 mg total) by mouth every 8 (eight) hours.   CARVEDILOL (COREG) 25 MG TABLET    Take 1.5 tablets (37.5 mg total) by mouth 2 (two) times daily with a meal.   CONTINUOUS BLOOD GLUC SENSOR (DEXCOM G7 SENSOR) MISC    1 DEVICE BY DOES NOT APPLY ROUTE AS DIRECTED. CHANGE SENSOR EVERY 10 DAYS   CONTINUOUS GLUCOSE SENSOR (DEXCOM G7 SENSOR) MISC    1 Device by Does not apply route continuous.   DOXYCYCLINE (VIBRAMYCIN) 100 MG CAPSULE    Take 1 capsule (100 mg total) by mouth 2 (two) times daily.   EMPAGLIFLOZIN (JARDIANCE) 10 MG TABS  TABLET    Take 1 tablet (10 mg total) by mouth daily before breakfast.   FLUTICASONE (FLONASE) 50 MCG/ACT NASAL SPRAY    Place 1 spray into both nostrils daily as needed for allergies or rhinitis.   GABAPENTIN (NEURONTIN) 100 MG CAPSULE    Take 1 capsule (100 mg total) by mouth 3 (three) times daily.   INSULIN ASPART (FIASP FLEXTOUCH) 100 UNIT/ML FLEXTOUCH PEN    INJECT 8 UNITS INTO THE SKIN 3 (THREE) TIMES DAILY BEFORE MEALS.   INSULIN GLARGINE, 1 UNIT DIAL, (TOUJEO SOLOSTAR) 300 UNIT/ML SOLOSTAR PEN    INJECT 14 UNITS INTO THE SKIN DAILY.   LOSARTAN (COZAAR) 100 MG TABLET    Take 1 tablet (100 mg total) by mouth daily.   MELATONIN PO    Take 1 capsule by mouth at bedtime as needed (sleep).   MULTIPLE VITAMINS-CALCIUM (ONE-A-DAY WOMENS PO)    Take 1 tablet by mouth daily.   ROSUVASTATIN (CRESTOR) 40 MG TABLET    Take 1 tablet (40 mg total) by mouth daily.   SODIUM CHLORIDE, HYPERTONIC, (MURO 128 OP)    Place 1 application into the left eye daily.  Modified Medications   No medications on file  Discontinued Medications   METFORMIN (GLUCOPHAGE) 1000 MG TABLET    Take 1 tablet (1,000 mg total) by mouth 2 (two) times daily with a  meal.    Allergies Allergies  Allergen Reactions   Canagliflozin     dizziness, stabbing pain in right side of body   Hydrocodone Hypertension    Past Medical History Past Medical History:  Diagnosis Date   Abnormal EKG    LVH with strain   Acid reflux    Asthma    Depression    Diabetes mellitus    A1C over 9   HLD (hyperlipidemia)    Hypertension    Noncompliance    Obesity    Pneumonia    Sleep apnea     Past Surgical History Past Surgical History:  Procedure Laterality Date   CARDIAC CATHETERIZATION     CATARACT EXTRACTION Left 06/04/2021   COLONOSCOPY WITH PROPOFOL N/A 04/29/2015   Procedure: COLONOSCOPY WITH PROPOFOL;  Surgeon: Charolett Bumpers, [place];  Location: [facility];  Service: Endoscopy;  Laterality: N/A;   EYE SURGERY     HYSTEROSCOPY WITH D & C N/A 09/07/2017   Procedure: DILATATION AND CURETTAGE /HYSTEROSCOPY;  Surgeon: Conan Bowens, [place];  Location: [facility];  Service: Gynecology;  Laterality: N/A;   IR ANGIO INTRA EXTRACRAN SEL COM CAROTID INNOMINATE UNI R MOD SED  06/18/2021   IR ANGIO INTRA EXTRACRAN SEL INTERNAL CAROTID BILAT MOD SED  08/19/2021   IR ANGIO INTRA EXTRACRAN SEL INTERNAL CAROTID UNI L MOD SED  06/18/2021   IR ANGIO INTRA EXTRACRAN SEL INTERNAL CAROTID UNI R MOD SED  07/20/2021   IR ANGIO VERTEBRAL SEL VERTEBRAL UNI R MOD SED  06/18/2021   IR ANGIO VERTEBRAL SEL VERTEBRAL UNI R MOD SED  08/19/2021   IR ANGIOGRAM FOLLOW UP STUDY  07/20/2021   IR CT HEAD LTD  07/20/2021   IR RADIOLOGIST EVAL & MGMT  06/19/2021   IR TRANSCATH/EMBOLIZ  07/20/2021   IR US GUIDE VASC ACCESS RIGHT  06/18/2021   IR US GUIDE VASC ACCESS RIGHT  08/19/2021   KNEE ARTHROSCOPY W/ MENISCAL REPAIR Right    RADIOLOGY WITH ANESTHESIA N/A 07/20/2021   Procedure: IR WITH ANESTHESIA;  Surgeon: Baldemar Lenis, [place];  Location: [facility];  Service: Radiology;  Laterality: N/A;    Family History family history includes Breast cancer in her cousin  and maternal aunt; Cancer in her mother; Diabetes in her mother; Hypertension in her mother.  Social History Social History   Socioeconomic History   Marital status: Married    Spouse name: Not on file   Number of children: Not on file   Years of education: Not on file   Highest education level: Not on file  Occupational History   Not on file  Tobacco Use   Smoking status: Never   Smokeless tobacco: Never  Vaping Use   Vaping status: Never Used  Substance and Sexual Activity   Alcohol use: Not Currently    Comment: occ   Drug use: No   Sexual activity: Not on file  Other Topics Concern   Not on file  Social History Narrative   ** Merged History Encounter **       Social Determinants of Health   Financial Resource Strain: Not on file  Food Insecurity: Not on file  Transportation Needs: Not on file  Physical Activity: Not on file  Stress: Not on file  Social Connections: Unknown (11/15/2021)   [facility], [facility]   Social Network    Social Network: Not on file  Intimate Partner Violence: Unknown (10/07/2021)   Received from [facility], [facility]   HITS    Physically Hurt: Not on file    Insult or Talk Down To: Not on file    Threaten Physical Harm: Not on file    Scream or Curse: Not on file    Lab Results  Component Value Date   HGBA1C 11.1 (H) 03/31/2023   HGBA1C 8.6 (H) 03/23/2022   HGBA1C 9.5 (H) 12/21/2021   Lab Results  Component Value Date   CHOL 159 04/29/2023   Lab Results  Component Value Date   HDL 53.60 04/29/2023   Lab Results  Component Value Date   LDLCALC 83 04/29/2023   Lab Results  Component Value Date   TRIG 113.0 04/29/2023   Lab Results  Component Value Date   CHOLHDL 3 04/29/2023   Lab Results  Component Value Date   CREATININE 0.95 03/31/2023   Lab Results  Component Value Date   GFR 64.41 03/31/2023   Lab Results  Component Value Date   MICROALBUR 62.2 (H) 04/29/2023      Component  Value Date/Time   NA 143 03/31/2023 1408   K 3.6 03/31/2023 1408   CL 107 03/31/2023 1408   CO2 27 03/31/2023 1408   GLUCOSE 159 (H) 03/31/2023 1408   BUN 12 03/31/2023 1408   CREATININE 0.95 03/31/2023 1408   CREATININE 1.00 10/16/2013 1759   CALCIUM 8.9 03/31/2023 1408   PROT 7.4 04/29/2023 1320   ALBUMIN 4.3 04/29/2023 1320   AST 20 04/29/2023 1320   ALT 27 04/29/2023 1320   ALKPHOS 63 04/29/2023 1320   BILITOT 0.4 04/29/2023 1320   GFRNONAA >60 07/31/2022 0759   GFRAA >60 08/09/2018 1017      Latest Ref Rng & Units 03/31/2023    2:08 PM 07/31/2022    7:59 AM 03/23/2022    1:16 PM  BMP  Glucose 70 - 99 mg/dL 782  956  213   BUN 6 - 23 mg/dL 12  15  16    Creatinine 0.40 - 1.20 mg/dL 0.86  5.78  4.69   Sodium 135 - 145 mEq/L 143  140  141  Potassium 3.5 - 5.1 mEq/L 3.6  3.6  3.7   Chloride 96 - 112 mEq/L 107  103  107   CO2 19 - 32 mEq/L 27  26  26    Calcium 8.4 - 10.5 mg/dL 8.9  9.3  9.4        Component Value Date/Time   WBC 5.4 07/31/2022 0759   RBC 4.52 07/31/2022 0759   HGB 13.1 07/31/2022 0759   HGB 12.0 05/30/2017 1858   HCT 40.1 07/31/2022 0759   HCT 36.3 05/30/2017 1858   PLT 202 07/31/2022 0759   PLT 248 05/30/2017 1858   MCV 88.7 07/31/2022 0759   MCV 90 05/30/2017 1858   MCH 29.0 07/31/2022 0759   MCHC 32.7 07/31/2022 0759   RDW 14.3 07/31/2022 0759   RDW 13.7 05/30/2017 1858   LYMPHSABS 1.7 07/31/2022 0759   LYMPHSABS 3.2 (H) 05/30/2017 1858   MONOABS 0.8 07/31/2022 0759   EOSABS 0.2 07/31/2022 0759   EOSABS 0.2 05/30/2017 1858   BASOSABS 0.1 07/31/2022 0759   BASOSABS 0.0 05/30/2017 1858     Parts of this note may have been dictated using voice recognition software. There may be variances in spelling and vocabulary which are unintentional. Not all errors are proofread. Please notify the Thereasa Parkin if any discrepancies are noted or if the meaning of any statement is not clear.

## 2023-05-03 NOTE — Patient Instructions (Signed)

## 2023-05-24 ENCOUNTER — Emergency Department (HOSPITAL_BASED_OUTPATIENT_CLINIC_OR_DEPARTMENT_OTHER): Payer: BLUE CROSS/BLUE SHIELD | Admitting: Radiology

## 2023-05-24 ENCOUNTER — Other Ambulatory Visit: Payer: Self-pay

## 2023-05-24 ENCOUNTER — Emergency Department (HOSPITAL_BASED_OUTPATIENT_CLINIC_OR_DEPARTMENT_OTHER)
Admission: EM | Admit: 2023-05-24 | Discharge: 2023-05-24 | Disposition: A | Discharge status: Home / Self Care | Payer: BLUE CROSS/BLUE SHIELD | Attending: Emergency Medicine | Admitting: Emergency Medicine

## 2023-05-24 ENCOUNTER — Encounter (HOSPITAL_BASED_OUTPATIENT_CLINIC_OR_DEPARTMENT_OTHER): Payer: Self-pay

## 2023-05-24 DIAGNOSIS — E119 Type 2 diabetes mellitus without complications: Secondary | ICD-10-CM | POA: Diagnosis not present

## 2023-05-24 DIAGNOSIS — Z79899 Other long term (current) drug therapy: Secondary | ICD-10-CM | POA: Insufficient documentation

## 2023-05-24 DIAGNOSIS — Z794 Long term (current) use of insulin: Secondary | ICD-10-CM | POA: Insufficient documentation

## 2023-05-24 DIAGNOSIS — Z7982 Long term (current) use of aspirin: Secondary | ICD-10-CM | POA: Diagnosis not present

## 2023-05-24 DIAGNOSIS — Z7984 Long term (current) use of oral hypoglycemic drugs: Secondary | ICD-10-CM | POA: Insufficient documentation

## 2023-05-24 DIAGNOSIS — R079 Chest pain, unspecified: Secondary | ICD-10-CM | POA: Diagnosis present

## 2023-05-24 DIAGNOSIS — J45909 Unspecified asthma, uncomplicated: Secondary | ICD-10-CM | POA: Insufficient documentation

## 2023-05-24 DIAGNOSIS — R9431 Abnormal electrocardiogram [ECG] [EKG]: Secondary | ICD-10-CM | POA: Insufficient documentation

## 2023-05-24 DIAGNOSIS — I10 Essential (primary) hypertension: Secondary | ICD-10-CM | POA: Diagnosis not present

## 2023-05-24 LAB — CBC
HCT: 37.5 % (ref 36.0–46.0)
Hemoglobin: 12.7 g/dL (ref 12.0–15.0)
MCH: 29.7 pg (ref 26.0–34.0)
MCHC: 33.9 g/dL (ref 30.0–36.0)
MCV: 87.6 fL (ref 80.0–100.0)
Platelets: 238 10*3/uL (ref 150–400)
RBC: 4.28 MIL/uL (ref 3.87–5.11)
RDW: 13.2 % (ref 11.5–15.5)
WBC: 5.6 10*3/uL (ref 4.0–10.5)
nRBC: 0 % (ref 0.0–0.2)

## 2023-05-24 LAB — BASIC METABOLIC PANEL
Anion gap: 9 (ref 5–15)
BUN: 18 mg/dL (ref 8–23)
CO2: 26 mmol/L (ref 22–32)
Calcium: 9.9 mg/dL (ref 8.9–10.3)
Chloride: 106 mmol/L (ref 98–111)
Creatinine, Ser: 0.86 mg/dL (ref 0.44–1.00)
GFR, Estimated: >60 mL/min (ref >60)
Glucose, Bld: 115 mg/dL — ABNORMAL HIGH (ref 70–99)
Potassium: 3.5 mmol/L (ref 3.5–5.1)
Sodium: 141 mmol/L (ref 135–145)

## 2023-05-24 LAB — TROPONIN I (HIGH SENSITIVITY)
Troponin I (High Sensitivity): 29 ng/L — ABNORMAL HIGH (ref <18)
Troponin I (High Sensitivity): 29 ng/L — ABNORMAL HIGH (ref <18)

## 2023-05-24 MED ORDER — ASPIRIN 325 MG PO TABS
325.0000 mg | ORAL_TABLET | Freq: Every day | ORAL | Status: DC
Start: 2023-05-24 — End: 2023-05-25
  Administered 2023-05-24: 325 mg via ORAL
  Filled 2023-05-24: qty 1

## 2023-05-24 NOTE — ED Provider Notes (Signed)
[place] EMERGENCY DEPARTMENT AT [facility] Provider Note   CSN: [id number] Arrival date & time: 05/24/23  1804     History  Chief Complaint  Patient presents with   Chest Pain    Shannon Oliver is a 62 y.o. female.  Pt is a 62 yo female with pmhx significant for HTN, DM2, GERD, HLD, depression, and asthma.  Pt woke up last night around 10 pm with severe cp.  It lasted about 1 hour.  She was able to go to sleep and slept ok.  She's had intermittent pain today.  She went to [facility] and she was told to come to the ED for further eval.  Pt denies cp right now.  She's had some left forearm pain for a few weeks.  She thinks she strained her left arm at work.  Pt denies f/c.        Home Medications Prior to Admission medications   Medication Sig Start Date End Date Taking? Authorizing Provider  ACCU-CHEK GUIDE test strip USE AS INSTRUCTED TO CHECK BLOOD SUGAR TWICE DAILY. 05/31/22   Reather Littler, MD  acetaminophen (TYLENOL) 500 MG tablet Take 1,000 mg by mouth every 6 (six) hours as needed for moderate pain.    [provider]  albuterol (VENTOLIN HFA) 108 (90 Base) MCG/ACT inhaler Inhale 1-2 puffs into the lungs every 6 (six) hours as needed for wheezing or shortness of breath. 07/31/22   Rancour, Jeannett Senior, MD  amLODipine (NORVASC) 10 MG tablet Take 1 tablet by mouth once daily. 07/31/21   Angiulli, Mcarthur Rossetti, PA-C  aspirin EC 81 MG EC tablet Take 1 tablet (81 mg total) by mouth daily. Swallow whole. Patient not taking: Reported on 04/07/2023 07/24/21   Elmer Picker, NP  benzonatate (TESSALON) 100 MG capsule Take 1 capsule (100 mg total) by mouth every 8 (eight) hours. Patient not taking: Reported on 04/07/2023 07/31/22   Glynn Octave, MD  carvedilol (COREG) 25 MG tablet Take 1.5 tablets (37.5 mg total) by mouth 2 (two) times daily with a meal. Patient taking differently: Take 37.5 mg by mouth daily. 07/31/21 05/03/23  Angiulli, Mcarthur Rossetti, PA-C  Continuous Blood Gluc  Sensor (DEXCOM G7 SENSOR) MISC 1 DEVICE BY DOES NOT APPLY ROUTE AS DIRECTED. CHANGE SENSOR EVERY 10 DAYS Patient not taking: Reported on 04/07/2023 05/25/22   Reather Littler, MD  Continuous Glucose Sensor (DEXCOM G7 SENSOR) MISC 1 Device by Does not apply route continuous. Patient not taking: Reported on 05/03/2023 04/07/23   Altamese , MD  doxycycline (VIBRAMYCIN) 100 MG capsule Take 1 capsule (100 mg total) by mouth 2 (two) times daily. Patient not taking: Reported on 04/07/2023 07/31/22   Glynn Octave, MD  empagliflozin (JARDIANCE) 10 MG TABS tablet Take 1 tablet (10 mg total) by mouth daily before breakfast. Patient not taking: Reported on 04/07/2023 11/16/22   Reather Littler, MD  fluticasone [facility]) 50 MCG/ACT nasal spray Place 1 spray into both nostrils daily as needed for allergies or rhinitis.    [provider]  gabapentin (NEURONTIN) 100 MG capsule Take 1 capsule (100 mg total) by mouth 3 (three) times daily. 04/07/23 07/06/23  Motwani, Carin Hock, MD  insulin aspart (FIASP FLEXTOUCH) 100 UNIT/ML FlexTouch Pen INJECT 8 UNITS INTO THE SKIN 3 (THREE) TIMES DAILY BEFORE MEALS. Patient taking differently: Inject 10 Units into the skin 2 (two) times daily with a meal. 11/24/22   Reather Littler, MD  insulin glargine, 1 Unit Dial, (TOUJEO SOLOSTAR) 300 UNIT/ML Solostar Pen INJECT 14 UNITS  INTO THE SKIN DAILY. Patient taking differently: Inject 18 Units into the skin in the morning. 09/17/22   Reather Littler, MD  losartan (COZAAR) 100 MG tablet Take 1 tablet (100 mg total) by mouth daily. 07/31/21   Angiulli, Mcarthur Rossetti, PA-C  MELATONIN PO Take 1 capsule by mouth at bedtime as needed (sleep). Patient not taking: Reported on 05/03/2023    [provider]  metFORMIN (GLUCOPHAGE-XR) 500 MG 24 hr tablet Take 1 tablet (500 mg total) by mouth 2 (two) times daily with a meal. 05/03/23   Motwani, Komal, MD  Multiple Vitamins-Calcium (ONE-A-DAY WOMENS PO) Take 1 tablet by mouth daily.    [provider]  rosuvastatin (CRESTOR) 40 MG tablet Take 1 tablet (40 mg total) by mouth daily. 07/31/21   Angiulli, Mcarthur Rossetti, PA-C  Sodium Chloride, Hypertonic, (MURO 128 OP) Place 1 application into the left eye daily. Patient not taking: Reported on 04/07/2023    [provider]      Allergies    Canagliflozin and Hydrocodone    Review of Systems   Review of Systems  Cardiovascular:  Positive for chest pain.  All other systems reviewed and are negative.   Physical Exam Updated Vital Signs BP (!) 180/87   Pulse 73   Temp 97.9 F (36.6 C)   Resp 13   Ht 5\' 6"  (1.676 m)   Wt 114.3 kg   LMP 08/02/2013 Comment: spotting  SpO2 99%   BMI 40.67 kg/m  Physical Exam Vitals and nursing note reviewed.  Constitutional:      Appearance: She is well-developed.  HENT:     Head: Normocephalic and atraumatic.  Eyes:     Extraocular Movements: Extraocular movements intact.     Pupils: Pupils are equal, round, and reactive to light.  Cardiovascular:     Rate and Rhythm: Normal rate and regular rhythm.     Heart sounds: Normal heart sounds.  Pulmonary:     Effort: Pulmonary effort is normal.     Breath sounds: Normal breath sounds.  Abdominal:     General: Bowel sounds are normal.     Palpations: Abdomen is soft.  Musculoskeletal:        General: Normal range of motion.     Cervical back: Normal range of motion and neck supple.  Skin:    General: Skin is warm.     Capillary Refill: Capillary refill takes less than 2 seconds.  Neurological:     General: No focal deficit present.     Mental Status: She is alert and oriented to person, place, and time.  Psychiatric:        Mood and Affect: Mood normal.        Behavior: Behavior normal.     ED Results / Procedures / Treatments   Labs (all labs ordered are listed, but only abnormal results are displayed) Labs Reviewed  BASIC METABOLIC PANEL - Abnormal; Notable for the following components:      Result Value    Glucose, Bld 115 (*)    All other components within normal limits  TROPONIN I (HIGH SENSITIVITY) - Abnormal; Notable for the following components:   Troponin I (High Sensitivity) 29 (*)    All other components within normal limits  TROPONIN I (HIGH SENSITIVITY) - Abnormal; Notable for the following components:   Troponin I (High Sensitivity) 29 (*)    All other components within normal limits  CBC    EKG EKG Interpretation Date/Time:  Tuesday May 24 2023 18:11:54 EST Ventricular Rate:  80 PR Interval:  170 QRS Duration:  86 QT Interval:  372 QTC Calculation: 429 R Axis:   47  Text Interpretation: Normal sinus rhythm Septal infarct , age undetermined ST & T wave abnormality, consider inferolateral ischemia Abnormal ECG When compared with ECG of 31-Jul-2022 11:30, PREVIOUS ECG IS PRESENT new changes Confirmed by Jacalyn Lefevre [phone]) on 05/24/2023 6:19:51 PM  Radiology No results found.  Procedures Procedures    Medications Ordered in ED Medications  aspirin tablet 325 mg (325 mg Oral Given 05/24/23 1912)    ED Course/ Medical Decision Making/ A&P                                 Medical Decision Making Amount and/or Complexity of Data Reviewed Labs: ordered. Radiology: ordered.  Risk OTC drugs.   This patient presents to the ED for concern of cp, this involves an extensive number of treatment options, and is a complaint that carries with it a high risk of complications and morbidity.  The differential diagnosis includes cardiac, pulm, gi   Co morbidities that complicate the patient evaluation   HTN, DM2, GERD, HLD, depression, and asthma   Additional history obtained:  Additional history obtained from epic chart review  Lab Tests:  I Ordered, and personally interpreted labs.  The pertinent results include:  cbc nl, bmp nl, trop sl elevated at 29, but repeat stayed the same   Imaging Studies ordered:  I ordered imaging studies including cxr  I  independently visualized and interpreted imaging which showed neg Radiology has not yet read cxr at time of d/c   Cardiac Monitoring:  The patient was maintained on a cardiac monitor.  I personally viewed and interpreted the cardiac monitored which showed an underlying rhythm of: nsr   Medicines ordered and prescription drug management:  I ordered medication including asa  for sx  Reevaluation of the patient after these medicines showed that the patient improved I have reviewed the patients home medicines and have made adjustments as needed   Problem List / ED Course:  CP:  no cp for several hours.  EKG is changed, but troponins are stable.  Pt has a cardiologist.  I think she can f/u as an outpatient.  She does know to return if sx worsen.  She knows it is very important to call her cardiologist tomorrow.   Reevaluation:  After the interventions noted above, I reevaluated the patient and found that they have :improved   Social Determinants of Health:  Lives at home   Dispostion:  After consideration of the diagnostic results and the patients response to treatment, I feel that the patent would benefit from discharge with outpatient f/u.          Final Clinical Impression(s) / ED Diagnoses Final diagnoses:  Abnormal EKG  Chest pain, unspecified type    Rx / DC Orders ED Discharge Orders     None         Jacalyn Lefevre, MD 05/24/23 2059

## 2023-05-24 NOTE — ED Triage Notes (Signed)
Pt POV reporting mid chest pain that awoke her from her sleep last night, not as bad as it was but still persistent. Endorses SOB but attributes to asthma. L arm pain due to mechanical injury at work. NAD noted at this time.

## 2023-05-24 NOTE — ED Notes (Signed)
Reviewed AVS with patient, patient expressed understanding of directions, denies further questions at this time. 

## 2023-06-16 ENCOUNTER — Ambulatory Visit: Payer: 59 | Admitting: "Endocrinology

## 2023-06-21 ENCOUNTER — Emergency Department (HOSPITAL_COMMUNITY)
Admission: EM | Admit: 2023-06-21 | Discharge: 2023-06-22 | Disposition: A | Discharge status: Home / Self Care | Payer: Medicaid Other | Attending: Emergency Medicine | Admitting: Emergency Medicine

## 2023-06-21 ENCOUNTER — Emergency Department (HOSPITAL_COMMUNITY): Payer: Medicaid Other

## 2023-06-21 ENCOUNTER — Other Ambulatory Visit: Payer: Self-pay

## 2023-06-21 ENCOUNTER — Encounter (HOSPITAL_COMMUNITY): Payer: Self-pay

## 2023-06-21 DIAGNOSIS — R0602 Shortness of breath: Secondary | ICD-10-CM | POA: Insufficient documentation

## 2023-06-21 DIAGNOSIS — R778 Other specified abnormalities of plasma proteins: Secondary | ICD-10-CM | POA: Insufficient documentation

## 2023-06-21 DIAGNOSIS — Z7984 Long term (current) use of oral hypoglycemic drugs: Secondary | ICD-10-CM | POA: Diagnosis not present

## 2023-06-21 DIAGNOSIS — R0789 Other chest pain: Secondary | ICD-10-CM | POA: Diagnosis present

## 2023-06-21 DIAGNOSIS — Z794 Long term (current) use of insulin: Secondary | ICD-10-CM | POA: Insufficient documentation

## 2023-06-21 DIAGNOSIS — E119 Type 2 diabetes mellitus without complications: Secondary | ICD-10-CM | POA: Insufficient documentation

## 2023-06-21 DIAGNOSIS — I10 Essential (primary) hypertension: Secondary | ICD-10-CM | POA: Diagnosis not present

## 2023-06-21 DIAGNOSIS — R7989 Other specified abnormal findings of blood chemistry: Secondary | ICD-10-CM

## 2023-06-21 DIAGNOSIS — Z79899 Other long term (current) drug therapy: Secondary | ICD-10-CM | POA: Diagnosis not present

## 2023-06-21 DIAGNOSIS — Z7982 Long term (current) use of aspirin: Secondary | ICD-10-CM | POA: Diagnosis not present

## 2023-06-21 LAB — BASIC METABOLIC PANEL
Anion gap: 9 (ref 5–15)
BUN: 19 mg/dL (ref 8–23)
CO2: 20 mmol/L — ABNORMAL LOW (ref 22–32)
Calcium: 8.8 mg/dL — ABNORMAL LOW (ref 8.9–10.3)
Chloride: 107 mmol/L (ref 98–111)
Creatinine, Ser: 0.88 mg/dL (ref 0.44–1.00)
GFR, Estimated: >60 mL/min (ref >60)
Glucose, Bld: 215 mg/dL — ABNORMAL HIGH (ref 70–99)
Potassium: 3.7 mmol/L (ref 3.5–5.1)
Sodium: 136 mmol/L (ref 135–145)

## 2023-06-21 LAB — CBC
HCT: 35.4 % — ABNORMAL LOW (ref 36.0–46.0)
Hemoglobin: 11.5 g/dL — ABNORMAL LOW (ref 12.0–15.0)
MCH: 29.1 pg (ref 26.0–34.0)
MCHC: 32.5 g/dL (ref 30.0–36.0)
MCV: 89.6 fL (ref 80.0–100.0)
Platelets: 223 10*3/uL (ref 150–400)
RBC: 3.95 MIL/uL (ref 3.87–5.11)
RDW: 13.3 % (ref 11.5–15.5)
WBC: 5.5 10*3/uL (ref 4.0–10.5)
nRBC: 0 % (ref 0.0–0.2)

## 2023-06-21 LAB — TROPONIN I (HIGH SENSITIVITY)
Troponin I (High Sensitivity): 33 ng/L — ABNORMAL HIGH (ref <18)
Troponin I (High Sensitivity): 34 ng/L — ABNORMAL HIGH (ref <18)

## 2023-06-21 NOTE — ED Triage Notes (Signed)
Patient sent by provider for elevated troponin level. Patient unable to provide value for labs but reports intermittent chest pain since her previous visit. Patient endorses SOB but thought it was her bronchitis, radiates across upper chest. A&ox4, ambulatory to triage.

## 2023-06-22 NOTE — ED Provider Notes (Signed)
[place] EMERGENCY DEPARTMENT AT [facility] Provider Note   CSN: [id number] Arrival date & time: 06/21/23  1803     History Chief Complaint  Patient presents with   Chest Pain    Shannon Oliver is a 62 y.o. female.  Patient past history significant for hypertension, diabetes, hyperlipidemia, prior elevated troponin levels presents to the emergency department concerns of chest pain.  Patient was advised to come to the emergency department by PCP due to elevated troponin levels.  Patient initially was seen in the emergency department 1 month ago for similar symptoms and reports that the symptoms have not fully resolved and is still continue to have some intermittent sensation of chest pressure in the center of her chest.  Also having some associated shortness of breath.  Denies any hemoptysis, recent surgery, prior history of PE or DVT.  Not currently on blood thinners but does take aspirin.   Chest Pain      Home Medications Prior to Admission medications   Medication Sig Start Date End Date Taking? Authorizing Provider  ACCU-CHEK GUIDE test strip USE AS INSTRUCTED TO CHECK BLOOD SUGAR TWICE DAILY. 05/31/22   Reather Littler, MD  acetaminophen (TYLENOL) 500 MG tablet Take 1,000 mg by mouth every 6 (six) hours as needed for moderate pain.    [provider]  albuterol (VENTOLIN HFA) 108 (90 Base) MCG/ACT inhaler Inhale 1-2 puffs into the lungs every 6 (six) hours as needed for wheezing or shortness of breath. 07/31/22   Rancour, Jeannett Senior, MD  amLODipine (NORVASC) 10 MG tablet Take 1 tablet by mouth once daily. 07/31/21   Angiulli, Mcarthur Rossetti, PA-C  aspirin EC 81 MG EC tablet Take 1 tablet (81 mg total) by mouth daily. Swallow whole. Patient not taking: Reported on 04/07/2023 07/24/21   Elmer Picker, NP  benzonatate (TESSALON) 100 MG capsule Take 1 capsule (100 mg total) by mouth every 8 (eight) hours. Patient not taking: Reported on 04/07/2023 07/31/22   Glynn Octave,  MD  carvedilol (COREG) 25 MG tablet Take 1.5 tablets (37.5 mg total) by mouth 2 (two) times daily with a meal. Patient taking differently: Take 37.5 mg by mouth daily. 07/31/21 05/03/23  Angiulli, Mcarthur Rossetti, PA-C  Continuous Blood Gluc Sensor (DEXCOM G7 SENSOR) MISC 1 DEVICE BY DOES NOT APPLY ROUTE AS DIRECTED. CHANGE SENSOR EVERY 10 DAYS Patient not taking: Reported on 04/07/2023 05/25/22   Reather Littler, MD  Continuous Glucose Sensor (DEXCOM G7 SENSOR) MISC 1 Device by Does not apply route continuous. Patient not taking: Reported on 05/03/2023 04/07/23   Altamese Salem, MD  doxycycline (VIBRAMYCIN) 100 MG capsule Take 1 capsule (100 mg total) by mouth 2 (two) times daily. Patient not taking: Reported on 04/07/2023 07/31/22   Glynn Octave, MD  empagliflozin (JARDIANCE) 10 MG TABS tablet Take 1 tablet (10 mg total) by mouth daily before breakfast. Patient not taking: Reported on 04/07/2023 11/16/22   Reather Littler, MD  fluticasone [facility]) 50 MCG/ACT nasal spray Place 1 spray into both nostrils daily as needed for allergies or rhinitis.    [provider]  gabapentin (NEURONTIN) 100 MG capsule Take 1 capsule (100 mg total) by mouth 3 (three) times daily. 04/07/23 07/06/23  Motwani, Carin Hock, MD  insulin aspart (FIASP FLEXTOUCH) 100 UNIT/ML FlexTouch Pen INJECT 8 UNITS INTO THE SKIN 3 (THREE) TIMES DAILY BEFORE MEALS. Patient taking differently: Inject 10 Units into the skin 2 (two) times daily with a meal. 11/24/22   Reather Littler, MD  insulin glargine,  1 Unit Dial, (TOUJEO SOLOSTAR) 300 UNIT/ML Solostar Pen INJECT 14 UNITS INTO THE SKIN DAILY. Patient taking differently: Inject 18 Units into the skin in the morning. 09/17/22   Reather Littler, MD  losartan (COZAAR) 100 MG tablet Take 1 tablet (100 mg total) by mouth daily. 07/31/21   Angiulli, Mcarthur Rossetti, PA-C  MELATONIN PO Take 1 capsule by mouth at bedtime as needed (sleep). Patient not taking: Reported on 05/03/2023    [provider]  metFORMIN  (GLUCOPHAGE-XR) 500 MG 24 hr tablet Take 1 tablet (500 mg total) by mouth 2 (two) times daily with a meal. 05/03/23   Motwani, Komal, MD  Multiple Vitamins-Calcium (ONE-A-DAY WOMENS PO) Take 1 tablet by mouth daily.    [provider]  rosuvastatin (CRESTOR) 40 MG tablet Take 1 tablet (40 mg total) by mouth daily. 07/31/21   Angiulli, Mcarthur Rossetti, PA-C  Sodium Chloride, Hypertonic, (MURO 128 OP) Place 1 application into the left eye daily. Patient not taking: Reported on 04/07/2023    [provider]      Allergies    Canagliflozin and Hydrocodone    Review of Systems   Review of Systems  Cardiovascular:  Positive for chest pain.  All other systems reviewed and are negative.   Physical Exam Updated Vital Signs BP (!) 173/89   Pulse 76   Temp 98.3 F (36.8 C)   Resp 18   Ht 5\' 6"  (1.676 m)   Wt 116.6 kg   LMP 08/02/2013 Comment: spotting  SpO2 100%   BMI 41.48 kg/m  Physical Exam Vitals and nursing note reviewed.  Constitutional:      General: She is not in acute distress.    Appearance: She is well-developed.  HENT:     Head: Normocephalic and atraumatic.  Eyes:     Conjunctiva/sclera: Conjunctivae normal.  Cardiovascular:     Rate and Rhythm: Normal rate and regular rhythm.     Heart sounds: No murmur heard. Pulmonary:     Effort: Pulmonary effort is normal. No respiratory distress.     Breath sounds: Normal breath sounds.  Abdominal:     Palpations: Abdomen is soft.     Tenderness: There is no abdominal tenderness.  Musculoskeletal:        General: No swelling.     Cervical back: Neck supple.  Skin:    General: Skin is warm and dry.     Capillary Refill: Capillary refill takes less than 2 seconds.  Neurological:     Mental Status: She is alert.  Psychiatric:        Mood and Affect: Mood normal.     ED Results / Procedures / Treatments   Labs (all labs ordered are listed, but only abnormal results are displayed) Labs Reviewed  BASIC  METABOLIC PANEL - Abnormal; Notable for the following components:      Result Value   CO2 20 (*)    Glucose, Bld 215 (*)    Calcium 8.8 (*)    All other components within normal limits  CBC - Abnormal; Notable for the following components:   Hemoglobin 11.5 (*)    HCT 35.4 (*)    All other components within normal limits  TROPONIN I (HIGH SENSITIVITY) - Abnormal; Notable for the following components:   Troponin I (High Sensitivity) 33 (*)    All other components within normal limits  TROPONIN I (HIGH SENSITIVITY) - Abnormal; Notable for the following components:   Troponin I (High Sensitivity) 34 (*)  All other components within normal limits    EKG None  Radiology DG Chest 2 View Result Date: 06/21/2023 CLINICAL DATA:  Chest pain. EXAM: CHEST - 2 VIEW COMPARISON:  05/24/2023 FINDINGS: The cardiomediastinal contours are normal. The lungs are clear. Pulmonary vasculature is normal. No consolidation, pleural effusion, or pneumothorax. No acute osseous abnormalities are seen. IMPRESSION: No active cardiopulmonary disease. Electronically Signed   By: Narda Rutherford M.D.   On: 06/21/2023 20:26    Procedures Procedures   Medications Ordered in ED Medications - No data to display  ED Course/ Medical Decision Making/ A&P                               Medical Decision Making Amount and/or Complexity of Data Reviewed Labs: ordered. Radiology: ordered.   This patient presents to the ED for concern of chest pain.  Differential diagnosis includes ACS, MI, PE, pneumonia, bronchitis, angina   Lab Tests:  I Ordered, and personally interpreted labs.  The pertinent results include: CBC unremarkable, BMP unremarkable, troponin elevated initially at 33 with delta troponin still elevated at 34   Imaging Studies ordered:  I ordered imaging studies including chest x-ray I independently visualized and interpreted imaging which showed no acute cardiopulmonary process seen I agree  with the radiologist interpretation   Problem List / ED Course:  Patient presents to the emergency department concerns of chest pain.  She reports having intermittent chest pain and some shortness of breath for the last month or so off-and-on.  She was told by her primary care provider to come to the emergency department for evaluation given elevated troponin level seen on labs.  She currently reports that she feels largely asymptomatic although she will still have some a typical of symptoms occurring intermittently.  No prior cardiac history although patient has seen cardiology about 10 years ago for poststroke evaluation.  She is not currently on blood thinners but does take aspirin.  Does have a history of hypertension, hyperlipidemia, and type 2 diabetes.  Labs ordered from triage. Initially evaluated patient while in triage and had patient brought back in for further assessment.  EKG shows no significant changes although, troponin is elevated at 33 with delta about 34.  Other labs unremarkable.  Will consult cardiology.  Heart score at 6 on my calculation. Spoke with Dr. Geraldo Pitter, cardiology, who advised that patient requires stress testing but can have this done in the outpatient setting. Will place cardiology referral. Cardiology referral placed.  Given the patient has remained asymptomatic and is otherwise in stable condition, will discharge patient home with plan for outpatient cardiology follow-up.  Discussed strict return precautions.  No other acute or focal concerns at this time.  Patient discharged home in stable condition.  Final Clinical Impression(s) / ED Diagnoses Final diagnoses:  Atypical chest pain  Elevated troponin    Rx / DC Orders ED Discharge Orders          Ordered    Ambulatory referral to Cardiology       Comments: If you have not heard from the Cardiology office within the next 72 hours please call [phone].   06/22/23 0122              Smitty Knudsen,  PA-C 06/22/23 0135    Mesner, Barbara Cower, MD 06/22/23 [phone]

## 2023-06-22 NOTE — Discharge Instructions (Signed)
You were seen in the ER today for concerns of chest discomfort. Your troponin level was elevated. I discussed your case with our on-call cardiologist who advised a stress test is needed, but this can be done outpatient. A referral to [facility]. They will be contacting you to schedule an appointment. If new or worsening symptoms arise, return to the ER.

## 2023-06-22 NOTE — ED Provider Triage Note (Signed)
Emergency Medicine Provider Triage Evaluation Note  Shannon Oliver , a 62 y.o. female  was evaluated in triage.  Pt complains of chest pain. Patient reports that she has been experiencing intermittent chest pain for about the last month or so. Has previously been evaluated by cardiology post-stroke. History of HTN, HLD, and DM2. Was advised to come to the ED by PCP for further evaluation given elevated troponin levels.  Review of Systems  Positive: As above Negative: As above  Physical Exam  BP (!) 145/67 (BP Location: Right Arm)   Pulse 74   Temp 98.3 F (36.8 C)   Resp 16   Ht 5\' 6"  (1.676 m)   Wt 116.6 kg   LMP 08/02/2013 Comment: spotting  SpO2 100%   BMI 41.48 kg/m  Gen:   Awake, no distress   Resp:  Normal effort  MSK:   Moves extremities without difficulty  Other:    Medical Decision Making  Medically screening exam initiated at 12:45 AM.  Appropriate orders placed.  Aella Shackelford Bozman was informed that the remainder of the evaluation will be completed by another provider, this initial triage assessment does not replace that evaluation, and the importance of remaining in the ED until their evaluation is complete.    Smitty Knudsen, PA-C 06/22/23 [phone]

## 2023-06-22 NOTE — ED Notes (Signed)
Patient evaluated by PA at triage .  

## 2023-07-25 ENCOUNTER — Emergency Department (HOSPITAL_COMMUNITY): Payer: BLUE CROSS/BLUE SHIELD

## 2023-07-25 ENCOUNTER — Emergency Department (HOSPITAL_COMMUNITY)
Admission: EM | Admit: 2023-07-25 | Discharge: 2023-07-25 | Disposition: A | Discharge status: Home / Self Care | Payer: BLUE CROSS/BLUE SHIELD | Attending: Emergency Medicine | Admitting: Emergency Medicine

## 2023-07-25 ENCOUNTER — Other Ambulatory Visit: Payer: Self-pay

## 2023-07-25 ENCOUNTER — Encounter (HOSPITAL_COMMUNITY): Payer: Self-pay

## 2023-07-25 DIAGNOSIS — E1165 Type 2 diabetes mellitus with hyperglycemia: Secondary | ICD-10-CM | POA: Diagnosis not present

## 2023-07-25 DIAGNOSIS — R739 Hyperglycemia, unspecified: Secondary | ICD-10-CM

## 2023-07-25 DIAGNOSIS — R29810 Facial weakness: Secondary | ICD-10-CM

## 2023-07-25 DIAGNOSIS — Z7984 Long term (current) use of oral hypoglycemic drugs: Secondary | ICD-10-CM | POA: Diagnosis not present

## 2023-07-25 DIAGNOSIS — Z794 Long term (current) use of insulin: Secondary | ICD-10-CM | POA: Diagnosis not present

## 2023-07-25 DIAGNOSIS — G510 Bell's palsy: Secondary | ICD-10-CM | POA: Insufficient documentation

## 2023-07-25 DIAGNOSIS — Z7982 Long term (current) use of aspirin: Secondary | ICD-10-CM | POA: Diagnosis not present

## 2023-07-25 LAB — CBC
HCT: 36.6 % (ref 36.0–46.0)
Hemoglobin: 12 g/dL (ref 12.0–15.0)
MCH: 29.4 pg (ref 26.0–34.0)
MCHC: 32.8 g/dL (ref 30.0–36.0)
MCV: 89.7 fL (ref 80.0–100.0)
Platelets: 228 10*3/uL (ref 150–400)
RBC: 4.08 MIL/uL (ref 3.87–5.11)
RDW: 13.8 % (ref 11.5–15.5)
WBC: 5.2 10*3/uL (ref 4.0–10.5)
nRBC: 0 % (ref 0.0–0.2)

## 2023-07-25 LAB — I-STAT CHEM 8, ED
BUN: 20 mg/dL (ref 8–23)
Calcium, Ion: 1.06 mmol/L — ABNORMAL LOW (ref 1.15–1.40)
Chloride: 105 mmol/L (ref 98–111)
Creatinine, Ser: 1.1 mg/dL — ABNORMAL HIGH (ref 0.44–1.00)
Glucose, Bld: 217 mg/dL — ABNORMAL HIGH (ref 70–99)
HCT: 36 % (ref 36.0–46.0)
Hemoglobin: 12.2 g/dL (ref 12.0–15.0)
Potassium: 4.2 mmol/L (ref 3.5–5.1)
Sodium: 141 mmol/L (ref 135–145)
TCO2: 26 mmol/L (ref 22–32)

## 2023-07-25 LAB — DIFFERENTIAL
Abs Immature Granulocytes: 0.02 10*3/uL (ref 0.00–0.07)
Basophils Absolute: 0 10*3/uL (ref 0.0–0.1)
Basophils Relative: 1 %
Eosinophils Absolute: 0.2 10*3/uL (ref 0.0–0.5)
Eosinophils Relative: 3 %
Immature Granulocytes: 0 %
Lymphocytes Relative: 39 %
Lymphs Abs: 2 10*3/uL (ref 0.7–4.0)
Monocytes Absolute: 0.6 10*3/uL (ref 0.1–1.0)
Monocytes Relative: 11 %
Neutro Abs: 2.4 10*3/uL (ref 1.7–7.7)
Neutrophils Relative %: 46 %

## 2023-07-25 LAB — COMPREHENSIVE METABOLIC PANEL
ALT: 23 U/L (ref 0–44)
AST: 33 U/L (ref 15–41)
Albumin: 3.8 g/dL (ref 3.5–5.0)
Alkaline Phosphatase: 68 U/L (ref 38–126)
Anion gap: 10 (ref 5–15)
BUN: 14 mg/dL (ref 8–23)
CO2: 22 mmol/L (ref 22–32)
Calcium: 8.8 mg/dL — ABNORMAL LOW (ref 8.9–10.3)
Chloride: 105 mmol/L (ref 98–111)
Creatinine, Ser: 1.1 mg/dL — ABNORMAL HIGH (ref 0.44–1.00)
GFR, Estimated: 57 mL/min — ABNORMAL LOW (ref >60)
Glucose, Bld: 213 mg/dL — ABNORMAL HIGH (ref 70–99)
Potassium: 4.3 mmol/L (ref 3.5–5.1)
Sodium: 137 mmol/L (ref 135–145)
Total Bilirubin: 0.9 mg/dL (ref 0.0–1.2)
Total Protein: 7.4 g/dL (ref 6.5–8.1)

## 2023-07-25 LAB — APTT: aPTT: 29 s (ref 24–36)

## 2023-07-25 LAB — TROPONIN I (HIGH SENSITIVITY): Troponin I (High Sensitivity): 26 ng/L — ABNORMAL HIGH (ref <18)

## 2023-07-25 LAB — ETHANOL: Alcohol, Ethyl (B): <10 mg/dL (ref <10)

## 2023-07-25 LAB — PROTIME-INR
INR: 1 (ref 0.8–1.2)
Prothrombin Time: 13.1 s (ref 11.4–15.2)

## 2023-07-25 MED ORDER — IOHEXOL 350 MG/ML SOLN
75.0000 mL | Freq: Once | INTRAVENOUS | Status: AC | PRN
  Administered 2023-07-25: 75 mL via INTRAVENOUS

## 2023-07-25 MED ORDER — FIASP FLEXTOUCH 100 UNIT/ML ~~LOC~~ SOPN: 12.0000 [IU] | PEN_INJECTOR | Freq: Three times a day (TID) | SUBCUTANEOUS | 1 refills | Status: DC

## 2023-07-25 MED ORDER — SODIUM CHLORIDE 0.9% FLUSH
3.0000 mL | Freq: Once | INTRAVENOUS | Status: AC
  Administered 2023-07-25: 3 mL via INTRAVENOUS

## 2023-07-25 NOTE — Consult Note (Signed)
NEUROLOGY CONSULT NOTE   Date of service: July 25, 2023 Patient Name: Shannon Oliver MRN:  [id number] DOB:  01-Jul-1961 Chief Complaint: Code Stroke History is obtained from: patient, spouse/SO, EMS personnel, and past medical records  History of Present Illness  Shannon Oliver is a 63 y.o. female  has a past medical history of Abnormal EKG, Acid reflux, Asthma, Depression, Diabetes mellitus, HLD (hyperlipidemia), Hypertension, Noncompliance, Obesity, Pneumonia, and Sleep apnea. who presents with weakness, left facial droop, and dysarthria. LKW 1600. Watching TV with her husband, got up and went to the bedroom. When she came back she told him her eye felt swollen and like she could close it. She called a nurse line and they were concerned that she was having a stroke and recommended she call EMS. She was able to ambulate to the ambulance.  At baseline she ambulates independently in the house and the she walks outside with a cane. She does still drive and manage her medications independently, and is working as a Lawyer. She is currently on ASA 81mg . She does have minor memory issues since her previous stroke, for example taking wrong turns when driving to work at times.   BP 155/75, glucose 217. CT head negative for bleed. MRI for decision making.    LKW: 1600 Modified rankin score: 1-No significant post stroke disability and can perform usual duties with stroke symptoms IV Thrombolysis: No, too mild to treat EVT: No, no LVO   1a Level of Conscious.: 1 1b LOC Questions: 0 1c LOC Commands: 0 2 Best Gaze: 0 3 Visual: 0 4 Facial Palsy: 1 5a Motor Arm - left: 0 5b Motor Arm - Right: 0 6a Motor Leg - Left: 2 6b Motor Leg - Right: 2 7 Limb Ataxia: 0 8 Sensory: 0 9 Best Language: 1 10 Dysarthria: 1 11 Extinct. and Inatten.: 0 TOTAL: 8    ROS  Comprehensive ROS performed and pertinent positives documented in HP  Past History   Past Medical History:  Diagnosis Date   Abnormal  EKG    LVH with strain   Acid reflux    Asthma    Depression    Diabetes mellitus    A1C over 9   HLD (hyperlipidemia)    Hypertension    Noncompliance    Obesity    Pneumonia    Sleep apnea     Past Surgical History:  Procedure Laterality Date   CARDIAC CATHETERIZATION     CATARACT EXTRACTION Left 06/04/2021   COLONOSCOPY WITH PROPOFOL N/A 04/29/2015   Procedure: COLONOSCOPY WITH PROPOFOL;  Surgeon: Charolett Bumpers, MD;  Location: [facility];  Service: Endoscopy;  Laterality: N/A;   EYE SURGERY     HYSTEROSCOPY WITH D & C N/A 09/07/2017   Procedure: DILATATION AND CURETTAGE /HYSTEROSCOPY;  Surgeon: Conan Bowens, MD;  Location: [facility];  Service: Gynecology;  Laterality: N/A;   IR ANGIO INTRA EXTRACRAN SEL COM CAROTID INNOMINATE UNI R MOD SED  06/18/2021   IR ANGIO INTRA EXTRACRAN SEL INTERNAL CAROTID BILAT MOD SED  08/19/2021   IR ANGIO INTRA EXTRACRAN SEL INTERNAL CAROTID UNI L MOD SED  06/18/2021   IR ANGIO INTRA EXTRACRAN SEL INTERNAL CAROTID UNI R MOD SED  07/20/2021   IR ANGIO VERTEBRAL SEL VERTEBRAL UNI R MOD SED  06/18/2021   IR ANGIO VERTEBRAL SEL VERTEBRAL UNI R MOD SED  08/19/2021   IR ANGIOGRAM FOLLOW UP STUDY  07/20/2021   IR CT HEAD LTD  07/20/2021  IR RADIOLOGIST EVAL & MGMT  06/19/2021   IR TRANSCATH/EMBOLIZ  07/20/2021   IR US GUIDE VASC ACCESS RIGHT  06/18/2021   IR US GUIDE VASC ACCESS RIGHT  08/19/2021   KNEE ARTHROSCOPY W/ MENISCAL REPAIR Right    RADIOLOGY WITH ANESTHESIA N/A 07/20/2021   Procedure: IR WITH ANESTHESIA;  Surgeon: Baldemar Lenis, MD;  Location: [facility] OR;  Service: Radiology;  Laterality: N/A;    Family History: Family History  Problem Relation Age of Onset   Cancer Mother    Hypertension Mother    Diabetes Mother    Breast cancer Maternal Aunt    Breast cancer Cousin     Social History  reports that she has never smoked. She has never used smokeless tobacco. She reports that she does not currently use  alcohol. She reports that she does not use drugs.  Allergies  Allergen Reactions   Canagliflozin     dizziness, stabbing pain in right side of body   Hydrocodone Hypertension    Medications   Current Facility-Administered Medications:    sodium chloride flush (NS) 0.9 % injection 3 mL, 3 mL, Intravenous, Once, Trifan, Kermit Balo, MD  Current Outpatient Medications:    ACCU-CHEK GUIDE test strip, USE AS INSTRUCTED TO CHECK BLOOD SUGAR TWICE DAILY., Disp: 100 strip, Rfl: 12   acetaminophen (TYLENOL) 500 MG tablet, Take 1,000 mg by mouth every 6 (six) hours as needed for moderate pain., Disp: , Rfl:    albuterol (VENTOLIN HFA) 108 (90 Base) MCG/ACT inhaler, Inhale 1-2 puffs into the lungs every 6 (six) hours as needed for wheezing or shortness of breath., Disp: 1 each, Rfl: 0   amLODipine (NORVASC) 10 MG tablet, Take 1 tablet by mouth once daily., Disp: 30 tablet, Rfl: 1   aspirin EC 81 MG EC tablet, Take 1 tablet (81 mg total) by mouth daily. Swallow whole. (Patient not taking: Reported on 04/07/2023), Disp: 30 tablet, Rfl: 11   benzonatate (TESSALON) 100 MG capsule, Take 1 capsule (100 mg total) by mouth every 8 (eight) hours. (Patient not taking: Reported on 04/07/2023), Disp: 21 capsule, Rfl: 0   carvedilol (COREG) 25 MG tablet, Take 1.5 tablets (37.5 mg total) by mouth 2 (two) times daily with a meal. (Patient taking differently: Take 37.5 mg by mouth daily.), Disp: 60 tablet, Rfl: 0   Continuous Blood Gluc Sensor (DEXCOM G7 SENSOR) MISC, 1 DEVICE BY DOES NOT APPLY ROUTE AS DIRECTED. CHANGE SENSOR EVERY 10 DAYS (Patient not taking: Reported on 04/07/2023), Disp: 3 each, Rfl: 3   Continuous Glucose Sensor (DEXCOM G7 SENSOR) MISC, 1 Device by Does not apply route continuous. (Patient not taking: Reported on 05/03/2023), Disp: 9 each, Rfl: 0   doxycycline (VIBRAMYCIN) 100 MG capsule, Take 1 capsule (100 mg total) by mouth 2 (two) times daily. (Patient not taking: Reported on 04/07/2023), Disp: 20  capsule, Rfl: 0   empagliflozin (JARDIANCE) 10 MG TABS tablet, Take 1 tablet (10 mg total) by mouth daily before breakfast. (Patient not taking: Reported on 04/07/2023), Disp: 90 tablet, Rfl: 0   fluticasone (FLONASE) 50 MCG/ACT nasal spray, Place 1 spray into both nostrils daily as needed for allergies or rhinitis., Disp: , Rfl:    gabapentin (NEURONTIN) 100 MG capsule, Take 1 capsule (100 mg total) by mouth 3 (three) times daily., Disp: 90 capsule, Rfl: 3   insulin aspart (FIASP FLEXTOUCH) 100 UNIT/ML FlexTouch Pen, INJECT 8 UNITS INTO THE SKIN 3 (THREE) TIMES DAILY BEFORE MEALS. (Patient taking differently: Inject 10  Units into the skin 2 (two) times daily with a meal.), Disp: 15 mL, Rfl: 1   insulin glargine, 1 Unit Dial, (TOUJEO SOLOSTAR) 300 UNIT/ML Solostar Pen, INJECT 14 UNITS INTO THE SKIN DAILY. (Patient taking differently: Inject 18 Units into the skin in the morning.), Disp: 3 mL, Rfl: 2   losartan (COZAAR) 100 MG tablet, Take 1 tablet (100 mg total) by mouth daily., Disp: 30 tablet, Rfl: 0   MELATONIN PO, Take 1 capsule by mouth at bedtime as needed (sleep). (Patient not taking: Reported on 05/03/2023), Disp: , Rfl:    metFORMIN (GLUCOPHAGE-XR) 500 MG 24 hr tablet, Take 1 tablet (500 mg total) by mouth 2 (two) times daily with a meal., Disp: 120 tablet, Rfl: 2   Multiple Vitamins-Calcium (ONE-A-DAY WOMENS PO), Take 1 tablet by mouth daily., Disp: , Rfl:    rosuvastatin (CRESTOR) 40 MG tablet, Take 1 tablet (40 mg total) by mouth daily., Disp: 30 tablet, Rfl: 0   Sodium Chloride, Hypertonic, (MURO 128 OP), Place 1 application into the left eye daily. (Patient not taking: Reported on 04/07/2023), Disp: , Rfl:   Vitals   Vitals:   08-20-2023 1800 2023/08/20 1912 20-Aug-2023 1930  BP:   (!) 141/72  Pulse:   77  Resp:   (!) 9  SpO2:   100%  Weight: 122.2 kg 115.2 kg   Height:  5\' 6"  (1.676 m)     Body mass index is 43.48 kg/m.   Constitutional: Appears well-developed and well-nourished.   Psych: Affect appropriate to situation.  Eyes: No scleral injection.  HENT: No OP obstruction.  Head: Normocephalic.  Cardiovascular: Normal rate and regular rhythm.  Respiratory: Effort normal, non-labored breathing.  GI: Soft.  No distension. There is no tenderness.  Skin: WDI.   Neurologic Examination   Neuro: Mental Status: Patient is drowsy  Mild dysarthria, not pronouncing "S's" Cranial Nerves: II: Visual Fields are full. Pupils are equal, round, and reactive to light.   III,IV, VI: EOMI without ptosis or diploplia.  V: Facial sensation is symmetric to temperature VII: Left facial droop, left eye closure weakness, reduced eyebrow raise on the left slightly VIII: Hearing is intact to voice X: Palate elevates symmetrically XI: Shoulder shrug is symmetric. XII: Tongue protrudes midline without atrophy or fasciculations.  Motor: Tone is normal. Bulk is normal.  Bilateral lower extremity weakness which is variable, able to at least raise both legs briefly antigravity but cannot maintain either reliably.  No drift of the bilateral upper extremities Sensory: Sensation is symmetric to light touch and temperature in the arms and legs. No extinction to DSS present.  Cerebellar: FNF intact.    Labs/Imaging/Neurodiagnostic studies   CBC:  Recent Labs  Lab 08-20-23 1824 August 20, 2023 1826  WBC 5.2  --   NEUTROABS 2.4  --   HGB 12.0 12.2  HCT 36.6 36.0  MCV 89.7  --   PLT 228  --     Basic Metabolic Panel:  Lab Results  Component Value Date   NA 141 08/20/23   K 4.2 08/20/2023   CO2 20 (L) 06/21/2023   GLUCOSE 217 (H) 2023/08/20   BUN 20 20-Aug-2023   CREATININE 1.10 (H) 08-20-2023   CALCIUM 8.8 (L) 06/21/2023   GFRNONAA >60 06/21/2023   GFRAA >60 08/09/2018    Lipid Panel:  Lab Results  Component Value Date   LDLCALC 83 04/29/2023    HgbA1c:  Lab Results  Component Value Date   HGBA1C 11.1 (H) 03/31/2023  Urine Drug Screen: No results found for:  "LABOPIA", "COCAINSCRNUR", "LABBENZ", "AMPHETMU", "THCU", "LABBARB"   Alcohol Level No results found for: "ETH"  INR  Lab Results  Component Value Date   INR 1.0 08/19/2021    APTT  Lab Results  Component Value Date   APTT 152 (H) 07/20/2021    AED levels: No results found for: "PHENYTOIN", "ZONISAMIDE", "LAMOTRIGINE", "LEVETIRACETA"   Code Stroke CT Head without contrast(Personally reviewed): Age indeterminate infarct in the right cerebellum, new from 07/27/2021. No hemorrhage.  CT angio Head and Neck with contrast w/ perfusion (Personally reviewed): 1. No intracranial large vessel occlusion. 2. No hemodynamically significant stenosis in the neck. 3. 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 4. Redemonstrated postprocedural changes from prior left ICA terminus aneurysm coiling.  MRI Brain(Personally reviewed): No significant new diffusion restriction on my review, full sequences and radiology report pending   ASSESSMENT   Shalynn Manalac Moffit is a 63 y.o. female  has a past medical history of Abnormal EKG, Acid reflux, Asthma, Depression, Diabetes mellitus, HLD (hyperlipidemia), Hypertension, Noncompliance, Obesity, Pneumonia, and Sleep apnea.  Presenting with left facial droop, particularly left eye closure weakness, dysarthria (difficulty pronouncing "s" sounds predominantly), and variable leg weakness bilaterally  Differential of her symptoms includes -Acute stroke with additional functional overlay/elaboration (acute stroke ruled out by MRI) -Possible Bell's palsy versus diabetic 7th nerve palsy (would not explain speech and leg issues)   Given her uncontrolled diabetes with A1c of 11.2% I would be hesitant to start steroids for relatively mild facial droop, although with Bell's palsy symptoms can worsen over time  RECOMMENDATIONS  -CTA head and neck given history of aneurysm -MRI brain without contrast to exclude any new significant stroke  given variable examination and significant risk factors -If radiology report confirms no acute intracranial process on MRI brain, does not need further inpatient management from a neurologic perspective if she can ambulate at baseline and repeat exam is otherwise reassuring -Return precautions for worsening symptoms, could reconsider addition of steroids if she has having worsening of symptoms within the first 3 days of symptom onset for possible Bell's palsy -Medical clearance per ED -If she is admitted for medical reasons or PT/OT needs etc, neurology will follow in consultation   ______________________________________________________________    Brooke Dare MD-PhD [facility] [phone]  CRITICAL CARE Performed by: Gordy Councilman   Total critical care time: 60 minutes  Critical care time was exclusive of separately billable procedures and treating other patients.  Critical care was necessary to treat or prevent imminent or life-threatening deterioration --emergent evaluation for consideration of thrombolytic or thrombectomy  Critical care was time spent personally by me on the following activities: development of treatment plan with patient and/or surrogate as well as nursing, discussions with consultants, evaluation of patient's response to treatment, examination of patient, obtaining history from patient or surrogate, ordering and performing treatments and interventions, ordering and review of laboratory studies, ordering and review of radiographic studies, pulse oximetry and re-evaluation of patient's condition.

## 2023-07-25 NOTE — ED Triage Notes (Signed)
Pt reported headache all day  (2/10 pain) , at approximately 1600 she began having facial issues. Left sided facial drop, left eye squinting, left mouth (asymmetry). Issues with pronouncing S in words.   Coil procedure  recently reported to em.  Hx diabetes, previous cva, and "chest pain" 3 weeks ago. Cardio appointment on 25th of February.  On ems arrival, bp 170/110 despite reported compliance with hypertension regime.Reported "hard time thinking" since previous aneurysm.   12 leads unremarkable.  Bp 174/102 CBG 317 Spo2 98% Oriented x4  Hr 90  Bilateral clear lung sounds.

## 2023-07-25 NOTE — ED Triage Notes (Signed)
Report given to [facility]  No questions at this time. Pt has returned from CT/MRI.

## 2023-07-25 NOTE — ED Provider Notes (Signed)
[place] EMERGENCY DEPARTMENT AT [facility] Provider Note   CSN: [id number] Arrival date & time: 07/25/23  1821  An emergency department physician performed an initial assessment on this suspected stroke patient at 1822.  History  Chief Complaint  Patient presents with   Code Stroke    Shannon Oliver is a 63 y.o. female presenting from home with concern for left-sided facial droop, difficulty speaking, weakness of the bilateral legs, symptoms noted to develop around 4 PM this afternoon.  Patient reporting a headache most of the day.  History of diabetes and prior stroke.  HPI     Home Medications Prior to Admission medications   Medication Sig Start Date End Date Taking? Authorizing Provider  acetaminophen (TYLENOL) 500 MG tablet Take 1,000 mg by mouth every 6 (six) hours as needed for mild pain (pain score 1-3), moderate pain (pain score 4-6) or headache.   Yes [provider]  albuterol (VENTOLIN HFA) 108 (90 Base) MCG/ACT inhaler Inhale 1-2 puffs into the lungs every 6 (six) hours as needed for wheezing or shortness of breath. Patient taking differently: Inhale 2 puffs into the lungs every 6 (six) hours as needed for shortness of breath or wheezing. 07/31/22  Yes Rancour, Jeannett Senior, MD  amLODipine (NORVASC) 10 MG tablet Take 1 tablet by mouth once daily. Patient taking differently: Take 10 mg by mouth every evening. 07/31/21  Yes Angiulli, Mcarthur Rossetti, PA-C  aspirin EC 81 MG EC tablet Take 1 tablet (81 mg total) by mouth daily. Swallow whole. 07/24/21  Yes Shafer, Ludger Nutting, NP  carvedilol (COREG) 25 MG tablet Take 25 mg by mouth 2 (two) times daily with a meal.   Yes [provider]  Continuous Glucose Sensor (DEXCOM G7 SENSOR) MISC 1 Device by Does not apply route continuous. Patient taking differently: Inject 1 Device into the skin See admin instructions. Place 1 new sensor into the skin every 10 days 04/07/23  Yes Motwani, Komal, MD  gabapentin (NEURONTIN)  100 MG capsule Take 1 capsule (100 mg total) by mouth 3 (three) times daily. Patient taking differently: Take 100-300 mg by mouth daily as needed (for neuropathic pain). 04/07/23 07/25/23 Yes Motwani, Komal, MD  insulin glargine, 1 Unit Dial, (TOUJEO SOLOSTAR) 300 UNIT/ML Solostar Pen INJECT 14 UNITS INTO THE SKIN DAILY. Patient taking differently: Inject 18 Units into the skin in the morning. 09/17/22  Yes Reather Littler, MD  losartan (COZAAR) 100 MG tablet Take 1 tablet (100 mg total) by mouth daily. 07/31/21  Yes Angiulli, Mcarthur Rossetti, PA-C  meloxicam (MOBIC) 15 MG tablet Take 7.5-15 mg by mouth daily as needed (for bilateral knee pain).   Yes [provider]  metFORMIN (GLUCOPHAGE-XR) 500 MG 24 hr tablet Take 1 tablet (500 mg total) by mouth 2 (two) times daily with a meal. Patient taking differently: Take 500 mg by mouth daily with breakfast. 05/03/23  Yes Motwani, Komal, MD  rosuvastatin (CRESTOR) 40 MG tablet Take 1 tablet (40 mg total) by mouth daily. Patient taking differently: Take 40 mg by mouth every evening. 07/31/21  Yes Angiulli, Mcarthur Rossetti, PA-C  tiZANidine (ZANAFLEX) 4 MG tablet Take 4 mg by mouth at bedtime as needed for muscle spasms.   Yes [provider]  ACCU-CHEK GUIDE test strip USE AS INSTRUCTED TO CHECK BLOOD SUGAR TWICE DAILY. 05/31/22   Reather Littler, MD  benzonatate (TESSALON) 100 MG capsule Take 1 capsule (100 mg total) by mouth every 8 (eight) hours. Patient not taking: Reported on 04/07/2023 07/31/22  Rancour, Jeannett Senior, MD  carvedilol (COREG) 25 MG tablet Take 1.5 tablets (37.5 mg total) by mouth 2 (two) times daily with a meal. Patient not taking: Reported on 07/25/2023 07/31/21 05/03/23  Angiulli, Mcarthur Rossetti, PA-C  Continuous Blood Gluc Sensor (DEXCOM G7 SENSOR) MISC 1 DEVICE BY DOES NOT APPLY ROUTE AS DIRECTED. CHANGE SENSOR EVERY 10 DAYS Patient not taking: Reported on 07/25/2023 05/25/22   Reather Littler, MD  doxycycline (VIBRAMYCIN) 100 MG capsule Take 1 capsule (100  mg total) by mouth 2 (two) times daily. Patient not taking: Reported on 07/25/2023 07/31/22   Glynn Octave, MD  empagliflozin (JARDIANCE) 10 MG TABS tablet Take 1 tablet (10 mg total) by mouth daily before breakfast. Patient not taking: Reported on 07/25/2023 11/16/22   Reather Littler, MD  insulin aspart (FIASP FLEXTOUCH) 100 UNIT/ML FlexTouch Pen Inject 12 Units into the skin 3 (three) times daily before meals for 90 doses. 07/25/23 08/24/23  Terald Sleeper, MD      Allergies    Canagliflozin and Hydrocodone    Review of Systems   Review of Systems  Physical Exam Updated Vital Signs BP (!) 166/81   Pulse 74   Temp (!) 97.2 F (36.2 C) (Temporal)   Resp 18   Ht 5\' 6"  (1.676 m)   Wt 115.2 kg   LMP 08/02/2013 Comment: spotting  SpO2 100%   BMI 41.00 kg/m  Physical Exam Constitutional:      General: She is not in acute distress. HENT:     Head: Normocephalic and atraumatic.  Eyes:     Conjunctiva/sclera: Conjunctivae normal.     Pupils: Pupils are equal, round, and reactive to light.  Cardiovascular:     Rate and Rhythm: Normal rate and regular rhythm.  Pulmonary:     Effort: Pulmonary effort is normal. No respiratory distress.  Abdominal:     General: There is no distension.     Tenderness: There is no abdominal tenderness.  Skin:    General: Skin is warm and dry.  Neurological:     Mental Status: She is alert.     Comments: Left sided facial droop involving the forehead muscles, left-sided facial paresthesias in all 3 dermatomes, cranial nerve exam otherwise intact Strength in the upper extremities grossly intact and symmetrical, hip flexion strength in the right lower extremity 4 out of 5, 5 out of 5 in the left lower extremity  Psychiatric:        Mood and Affect: Mood normal.        Behavior: Behavior normal.     ED Results / Procedures / Treatments   Labs (all labs ordered are listed, but only abnormal results are displayed) Labs Reviewed  COMPREHENSIVE  METABOLIC PANEL - Abnormal; Notable for the following components:      Result Value   Glucose, Bld 213 (*)    Creatinine, Ser 1.10 (*)    Calcium 8.8 (*)    GFR, Estimated 57 (*)    All other components within normal limits  I-STAT CHEM 8, ED - Abnormal; Notable for the following components:   Creatinine, Ser 1.10 (*)    Glucose, Bld 217 (*)    Calcium, Ion 1.06 (*)    All other components within normal limits  TROPONIN I (HIGH SENSITIVITY) - Abnormal; Notable for the following components:   Troponin I (High Sensitivity) 26 (*)    All other components within normal limits  PROTIME-INR  APTT  CBC  DIFFERENTIAL  ETHANOL  CBG MONITORING, ED  EKG EKG Interpretation Date/Time:  Monday July 25 2023 20:07:19 EST Ventricular Rate:  73 PR Interval:  179 QRS Duration:  90 QT Interval:  390 QTC Calculation: 430 R Axis:   32  Text Interpretation: Sinus rhythm Anteroseptal infarct, old Nonspecific T abnormalities, lateral leads Confirmed by Alvester Chou [phone]) on 07/25/2023 8:47:44 PM  Radiology MR BRAIN WO CONTRAST Result Date: 07/25/2023 CLINICAL DATA:  Neuro deficit, acute, stroke suspected EXAM: MRI HEAD WITHOUT CONTRAST TECHNIQUE: Multiplanar, multiecho pulse sequences of the brain and surrounding structures were obtained without intravenous contrast. COMPARISON:  Same day CT head.  MRI head 07/27/2021. FINDINGS: Brain: No acute infarction, hemorrhage, hydrocephalus, extra-axial collection or mass lesion. Moderate patchy T2/FLAIR hyperintensities in the white matter nonspecific but compatible with chronic microvascular ischemic change. Remote right cerebellar infarct. Vascular: Major arterial flow voids are maintained the skull base. Skull and upper cervical spine: Normal marrow signal. Sinuses/Orbits: Clear sinuses.  No acute orbital findings. Other: No mastoid effusions. IMPRESSION: 1. No evidence of acute intracranial abnormality. 2. Moderate chronic microvascular ischemic  disease. Electronically Signed   By: Feliberto Harts M.D.   On: 07/25/2023 19:56   CT ANGIO HEAD NECK W WO CM (CODE STROKE) Result Date: 07/25/2023 CLINICAL DATA:  Neuro deficit, acute, stroke suspected EXAM: CT ANGIOGRAPHY HEAD AND NECK WITH AND WITHOUT CONTRAST TECHNIQUE: Multidetector CT imaging of the head and neck was performed using the standard protocol during bolus administration of intravenous contrast. Multiplanar CT image reconstructions and MIPs were obtained to evaluate the vascular anatomy. Carotid stenosis measurements (when applicable) are obtained utilizing NASCET criteria, using the distal internal carotid diameter as the denominator. RADIATION DOSE REDUCTION: This exam was performed according to the departmental dose-optimization program which includes automated exposure control, adjustment of the mA and/or kV according to patient size and/or use of iterative reconstruction technique. CONTRAST:  75mL OMNIPAQUE IOHEXOL 350 MG/ML SOLN COMPARISON:  CT head and neck 07/28/2021 FINDINGS: CT HEAD FINDINGS See same day CT for intracranial findings CTA NECK FINDINGS Aortic arch: Standard branching. Imaged portion shows no evidence of aneurysm or dissection. No significant stenosis of the major arch vessel origins. Right carotid system: No evidence of dissection, stenosis (50% or greater), or occlusion. Mild narrowing of the origin of the right ICA secondary to soft atherosclerotic plaque. Left carotid system: No evidence of dissection, stenosis (50% or greater), or occlusion. Vertebral arteries: Codominant. No evidence of dissection, stenosis (50% or greater), or occlusion. Skeleton: Grade 1 anterolisthesis of C4 on C5 and C5 on C6. Other neck: Negative. Upper chest: Negative. Review of the MIP images confirms the above findings CTA HEAD FINDINGS Anterior circulation: Redemonstrated postprocedural changes from prior left ICA terminus aneurysm coiling. 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 No significant stenosis. No occlusion. Posterior circulation: No significant stenosis, proximal occlusion, aneurysm, or vascular malformation. Venous sinuses: As permitted by contrast timing, patent. Anatomic variants: No Review of the MIP images confirms the above findings IMPRESSION: 1. No intracranial large vessel occlusion. 2. No hemodynamically significant stenosis in the neck. 3. 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 4. Redemonstrated postprocedural changes from prior left ICA terminus aneurysm coiling. Electronically Signed   By: Lorenza Cambridge M.D.   On: 07/25/2023 18:56   CT HEAD CODE STROKE WO CONTRAST Result Date: 07/25/2023 CLINICAL DATA:  Code stroke.  Left facial droop EXAM: CT HEAD WITHOUT CONTRAST TECHNIQUE: Contiguous axial images were obtained from the base of the skull  through the vertex without intravenous contrast. RADIATION DOSE REDUCTION: This exam was performed according to the departmental dose-optimization program which includes automated exposure control, adjustment of the mA and/or kV according to patient size and/or use of iterative reconstruction technique. COMPARISON:  Brain MR 07/27/2021 FINDINGS: Brain: No hemorrhage. No hydrocephalus. No extra-axial fluid collection. Mass effect. No mass lesion. There is age indeterminate infarct in the right cerebellum, new from 07/27/2021. There is a background of moderate chronic microvascular ischemic change. Vascular: No hyperdense vessel or unexpected calcification. Skull: Normal. Negative for fracture or focal lesion. Sinuses/Orbits: No middle ear or mastoid effusion. Paranasal sinuses are clear. Left lens replacement. Orbits are otherwise unremarkable. Other: None. ASPECTS [facility] Stroke Program Early CT Score): 10 IMPRESSION: Age indeterminate infarct in the right cerebellum, new from 07/27/2021. No hemorrhage. Findings were paged to Dr. Iver Nestle on 07/25/23 at 6:44 PM. Electronically Signed    By: Lorenza Cambridge M.D.   On: 07/25/2023 18:45    Procedures Procedures    Medications Ordered in ED Medications  sodium chloride flush (NS) 0.9 % injection 3 mL (3 mLs Intravenous Given 07/25/23 1931)  iohexol (OMNIPAQUE) 350 MG/ML injection 75 mL (75 mLs Intravenous Contrast Given 07/25/23 1835)    ED Course/ Medical Decision Making/ A&P                                 Medical Decision Making Amount and/or Complexity of Data Reviewed Labs: ordered. Radiology: ordered.  Risk Prescription drug management.   Patient presented to ED with concern for left-sided paresthesias and facial droop onset earlier today.  Differential would include Bell's palsy versus CVA versus other.  There was concern about potential speech problems as well as acute on chronic weakness in her lower limbs, therefore the patient arrives as a code stroke.  She was evaluated by the neurologist, I would recommended MRI and CT imaging.  If there are no emergent findings neurologist recommended consideration of treatment of Bell's palsy as a likely cause of facial findings.  I personally viewed interpret the patient's labs, EKG and imaging.  There were no emergent findings at this time.  Upon my evaluation I do agree that her facial presentation is highly consistent with Bell's palsy, likely idiopathic.  No evidence of shingles outbreak.  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  Her sugars are elevated at 300 today.  She does have chronic weakness of the lower extremities which is ongoing from her prior stroke.  She is typically takes her some time to adjust her strength getting out of bed in the morning, and she uses a cane.  This presentation is not abnormal for her.  She does not have any other new neurological deficits to raise concerns.  I have a low suspicion for meningitis.  No indication for lumbar  puncture.  Supplemental history is provided by EMS.  I explained the patient's workup to her and her family at bedside.  They are comfortable going home.  We discussed conservative care for Bell's palsy, lubricating eyedrops, taping the eye as necessary.  No indication for Valtrex at this time        Final Clinical Impression(s) / ED Diagnoses Final diagnoses:  Bell's palsy  Hyperglycemia    Rx / DC Orders ED Discharge Orders  Ordered    insulin aspart (FIASP FLEXTOUCH) 100 UNIT/ML FlexTouch Pen  3 times daily before meals       Note to Pharmacy: Please provide 30 day supply   07/25/23 2121              Terald Sleeper, MD 07/25/23 2326

## 2023-07-25 NOTE — ED Notes (Signed)
Code stroke cancelled 

## 2023-07-25 NOTE — Code Documentation (Addendum)
Stroke Response Nurse Documentation Code Documentation  CHARLEY ISKHAKOV is a 63 y.o. female arriving to [facility]  via [facility] on 1/20 with past medical hx of DM, CVA. On No antithrombotic. Code stroke was activated by EMS.   Patient from home where she was LKW at 1600 and now complaining of difficulty moving L eye and slurred speech. She has had a headache all day and at 1600 thought it was hard to move her eye so she spoke to her husband who said they eye looked normal and also did not notice a facial droop, but she was having trouble pronouncing the "s" sound so EMS was called and code stroke was activated.   Stroke team at the bedside on patient arrival. Labs drawn and patient cleared for CT by Dr. Renaye Rakers. Patient to CT with team. NIHSS 8, see documentation for details and code stroke times. Patient with decreased LOC, left facial droop, bilateral leg weakness, Expressive aphasia , and dysarthria  on exam. The following imaging was completed:  CT Head, CTA, and MRI. Patient is not a candidate for IV Thrombolytic due to no stroke shown on initial MRI imaging per MD. Patient is not a candidate for IR due to no LVO on CTA.   Care Plan: q2 hr NIHSS and vital signs. If MRI negative, can cancel code stroke.   Bedside handoff with ED RN Reita Cliche.    Pearlie Oyster  Stroke Response RN

## 2023-07-26 LAB — CBG MONITORING, ED: Glucose-Capillary: 211 mg/dL — ABNORMAL HIGH (ref 70–99)

## 2023-07-28 ENCOUNTER — Ambulatory Visit (INDEPENDENT_AMBULATORY_CARE_PROVIDER_SITE_OTHER): Payer: BLUE CROSS/BLUE SHIELD | Admitting: "Endocrinology

## 2023-07-28 ENCOUNTER — Encounter: Payer: Self-pay | Admitting: "Endocrinology

## 2023-07-28 VITALS — BP 150/62 | HR 77 | Wt 260.2 lb

## 2023-07-28 DIAGNOSIS — Z794 Long term (current) use of insulin: Secondary | ICD-10-CM | POA: Diagnosis not present

## 2023-07-28 DIAGNOSIS — Z7984 Long term (current) use of oral hypoglycemic drugs: Secondary | ICD-10-CM

## 2023-07-28 DIAGNOSIS — E7800 Pure hypercholesterolemia, unspecified: Secondary | ICD-10-CM | POA: Diagnosis not present

## 2023-07-28 DIAGNOSIS — E1165 Type 2 diabetes mellitus with hyperglycemia: Secondary | ICD-10-CM | POA: Diagnosis not present

## 2023-07-28 LAB — POCT GLYCOSYLATED HEMOGLOBIN (HGB A1C): Hemoglobin A1C: 8.7 % — AB (ref 4.0–5.6)

## 2023-07-28 MED ORDER — METFORMIN HCL ER 500 MG PO TB24: 500.0000 mg | ORAL_TABLET | Freq: Two times a day (BID) | ORAL | 4 refills | Status: AC

## 2023-07-28 MED ORDER — TOUJEO SOLOSTAR 300 UNIT/ML ~~LOC~~ SOPN: 24.0000 [IU] | PEN_INJECTOR | Freq: Every day | SUBCUTANEOUS | 3 refills | Status: DC

## 2023-07-28 MED ORDER — FIASP FLEXTOUCH 100 UNIT/ML ~~LOC~~ SOPN: 16.0000 [IU] | PEN_INJECTOR | Freq: Three times a day (TID) | SUBCUTANEOUS | 3 refills | Status: AC

## 2023-07-28 NOTE — Patient Instructions (Addendum)
Will recommend the following: On Toujeo 24 units qam  Fiasp 16 units before meals three times a day 15 min before meals  Metformin XR 500mg  twice a day

## 2023-07-28 NOTE — Progress Notes (Signed)
Outpatient Endocrinology Note [place], MD  07/28/23   Shannon Oliver Aug 07, 1960 [id number]  Referring Provider: Lorenda Ishihara,* Primary Care Provider: Pcp, No Reason for consultation: Subjective   Assessment & Plan  Diagnoses and all orders for this visit:  Uncontrolled type 2 diabetes mellitus with hyperglycemia, with long-term current use of insulin (HCC) -     POCT glycosylated hemoglobin (Hb A1C)  Other orders -     insulin aspart (FIASP FLEXTOUCH) 100 UNIT/ML FlexTouch Pen; Inject 16 Units into the skin 3 (three) times daily before meals. -     insulin glargine, 1 Unit Dial, (TOUJEO SOLOSTAR) 300 UNIT/ML Solostar Pen; Inject 24 Units into the skin daily. -     metFORMIN (GLUCOPHAGE-XR) 500 MG 24 hr tablet; Take 1 tablet (500 mg total) by mouth 2 (two) times daily with a meal.    Diabetes Type II complicated by neuropathy Lab Results  Component Value Date   GFR 64.41 03/31/2023   Hba1c goal less than 7, current Hba1c is  Lab Results  Component Value Date   HGBA1C 8.7 (A) 07/28/2023   Will recommend the following: On Toujeo 24 units qam  Fiasp 16 units three times a day before each meal  Metformin XR 500mg  twice a day (max preferred dose)  Gabapentin for neuropathy  No known contraindications/side effects to any of above medications  -Last LD and Tg are as follows: Lab Results  Component Value Date   LDLCALC 83 04/29/2023    Lab Results  Component Value Date   TRIG 113.0 04/29/2023   -rosuvastatin 40 mg QD -Follow low fat diet and exercise   -Blood pressure goal <140/90 - Microalbumin/creatinine goal is < 30 -Last MA/Cr is as follows: Lab Results  Component Value Date   MICROALBUR 62.2 (H) 04/29/2023   - on ACE/ARB losartan 100 mg qd -diet changes including salt restriction -limit eating outside -counseled BP targets per standards of diabetes care -uncontrolled blood pressure can lead to retinopathy, nephropathy and  cardiovascular and atherosclerotic heart disease  Reviewed and counseled on: -A1C target -Blood sugar targets -Complications of uncontrolled diabetes  -Checking blood sugar before meals and bedtime and bring log next visit -All medications with mechanism of action and side effects -Hypoglycemia management: rule of 15's, Glucagon Emergency Kit and medical alert ID -low-carb low-fat plate-method diet -At least 20 minutes of physical activity per day -Annual dilated retinal eye exam and foot exam -compliance and follow up needs -follow up as scheduled or earlier if problem gets worse  Call if blood sugar is less than 70 or consistently above 250    Take a 15 gm snack of carbohydrate at bedtime before you go to sleep if your blood sugar is less than 100.    If you are going to fast after midnight for a test or procedure, ask your physician for instructions on how to reduce/decrease your insulin dose.    Call if blood sugar is less than 70 or consistently above 250  -Treating a low sugar by rule of 15  (15 gms of sugar every 15 min until sugar is more than 70) If you feel your sugar is low, test your sugar to be sure If your sugar is low (less than 70), then take 15 grams of a fast acting Carbohydrate (3-4 glucose tablets or glucose gel or 4 ounces of juice or regular soda) Recheck your sugar 15 min after treating low to make sure it is more than 70 If sugar  is still less than 70, treat again with 15 grams of carbohydrate          Don't drive the hour of hypoglycemia  If unconscious/unable to eat or drink by mouth, use glucagon injection or nasal spray baqsimi and call 911. Can repeat again in 15 min if still unconscious.  Return in about 27 days (around 08/24/2023).   I have reviewed current medications, nurse's notes, allergies, vital signs, past medical and surgical history, family medical history, and social history for this encounter. Counseled patient on symptoms, examination  findings, lab findings, imaging results, treatment decisions and monitoring and prognosis. The patient understood the recommendations and agrees with the treatment plan. All questions regarding treatment plan were fully answered.  [place], MD  07/28/23    History of Present Illness Shannon Oliver is a 63 y.o. year old female who presents for evaluation of Type II diabetes mellitus.  Shannon Oliver was first diagnosed in 2008.   Diabetes education +  Home diabetes regimen: On Toujeo 24 units qam  Fiasp 16 units before meals at least twice a day  Metformin XR 500mg -self cut it to current dose   Stopped Insulin 70/30 or fiasp 10-12 units bidac Couldn't get fiasp 8 units tidac/ farxiga/jardiance 10 mg every day  Background history:   She thinks she has been on mostly oral hypoglycemic drugs notably metformin and Amaryl for most of the duration of her diabetes She was probably started on insulin in 2018 with Levemir insulin when her A1c was 9.6 She has not had an endocrinology follow-up since 03/2017  COMPLICATIONS +  MI/Stroke +  retinopathy +  neuropathy -  nephropathy  BLOOD SUGAR DATA  CGM interpretation: At today's visit, we reviewed her CGM downloads. The full report is scanned in the media. Reviewing the CGM trends, BG are elevated after lunch and dinner.  Physical Exam  BP (!) 150/62   Pulse 77   Wt 260 lb 3.2 oz (118 kg)   LMP 08/02/2013 Comment: spotting  SpO2 99%   BMI 42.00 kg/m    Constitutional: well developed, well nourished Head: normocephalic, atraumatic Eyes: sclera anicteric, no redness Neck: supple Lungs: normal respiratory effort Neurology: alert and oriented Skin: dry, no appreciable rashes Musculoskeletal: no appreciable defects Psychiatric: normal mood and affect Diabetic Foot Exam - Simple   No data filed      Current Medications Patient's Medications  New Prescriptions   No medications on file  Previous Medications    ACCU-CHEK GUIDE TEST STRIP    USE AS INSTRUCTED TO CHECK BLOOD SUGAR TWICE DAILY.   ACETAMINOPHEN (TYLENOL) 500 MG TABLET    Take 1,000 mg by mouth every 6 (six) hours as needed for mild pain (pain score 1-3), moderate pain (pain score 4-6) or headache.   ALBUTEROL (VENTOLIN HFA) 108 (90 BASE) MCG/ACT INHALER    Inhale 1-2 puffs into the lungs every 6 (six) hours as needed for wheezing or shortness of breath.   AMLODIPINE (NORVASC) 10 MG TABLET    Take 1 tablet by mouth once daily.   ASPIRIN EC 81 MG EC TABLET    Take 1 tablet (81 mg total) by mouth daily. Swallow whole.   BENZONATATE (TESSALON) 100 MG CAPSULE    Take 1 capsule (100 mg total) by mouth every 8 (eight) hours.   CARVEDILOL (COREG) 25 MG TABLET    Take 1.5 tablets (37.5 mg total) by mouth 2 (two) times daily with a meal.   CARVEDILOL (COREG)  25 MG TABLET    Take 25 mg by mouth 2 (two) times daily with a meal.   CONTINUOUS BLOOD GLUC SENSOR (DEXCOM G7 SENSOR) MISC    1 DEVICE BY DOES NOT APPLY ROUTE AS DIRECTED. CHANGE SENSOR EVERY 10 DAYS   CONTINUOUS GLUCOSE SENSOR (DEXCOM G7 SENSOR) MISC    1 Device by Does not apply route continuous.   DOXYCYCLINE (VIBRAMYCIN) 100 MG CAPSULE    Take 1 capsule (100 mg total) by mouth 2 (two) times daily.   EMPAGLIFLOZIN (JARDIANCE) 10 MG TABS TABLET    Take 1 tablet (10 mg total) by mouth daily before breakfast.   GABAPENTIN (NEURONTIN) 100 MG CAPSULE    Take 1 capsule (100 mg total) by mouth 3 (three) times daily.   LOSARTAN (COZAAR) 100 MG TABLET    Take 1 tablet (100 mg total) by mouth daily.   MELOXICAM (MOBIC) 15 MG TABLET    Take 7.5-15 mg by mouth daily as needed (for bilateral knee pain).   ROSUVASTATIN (CRESTOR) 40 MG TABLET    Take 1 tablet (40 mg total) by mouth daily.   TIZANIDINE (ZANAFLEX) 4 MG TABLET    Take 4 mg by mouth at bedtime as needed for muscle spasms.  Modified Medications   Modified Medication Previous Medication   INSULIN ASPART (FIASP FLEXTOUCH) 100 UNIT/ML FLEXTOUCH PEN  insulin aspart (FIASP FLEXTOUCH) 100 UNIT/ML FlexTouch Pen      Inject 16 Units into the skin 3 (three) times daily before meals.    Inject 12 Units into the skin 3 (three) times daily before meals for 90 doses.   INSULIN GLARGINE, 1 UNIT DIAL, (TOUJEO SOLOSTAR) 300 UNIT/ML SOLOSTAR PEN insulin glargine, 1 Unit Dial, (TOUJEO SOLOSTAR) 300 UNIT/ML Solostar Pen      Inject 24 Units into the skin daily.    INJECT 14 UNITS INTO THE SKIN DAILY.   METFORMIN (GLUCOPHAGE-XR) 500 MG 24 HR TABLET metFORMIN (GLUCOPHAGE-XR) 500 MG 24 hr tablet      Take 1 tablet (500 mg total) by mouth 2 (two) times daily with a meal.    Take 1 tablet (500 mg total) by mouth 2 (two) times daily with a meal.  Discontinued Medications   No medications on file    Allergies Allergies  Allergen Reactions   Canagliflozin Other (See Comments)    "Dizziness, stabbing pain in right side of body"   Hydrocodone Hypertension    Past Medical History Past Medical History:  Diagnosis Date   Abnormal EKG    LVH with strain   Acid reflux    Asthma    Depression    Diabetes mellitus    A1C over 9   HLD (hyperlipidemia)    Hypertension    Noncompliance    Obesity    Pneumonia    Sleep apnea     Past Surgical History Past Surgical History:  Procedure Laterality Date   CARDIAC CATHETERIZATION     CATARACT EXTRACTION Left 06/04/2021   COLONOSCOPY WITH PROPOFOL N/A 04/29/2015   Procedure: COLONOSCOPY WITH PROPOFOL;  Surgeon: Charolett Bumpers, MD;  Location: [facility];  Service: Endoscopy;  Laterality: N/A;   EYE SURGERY     HYSTEROSCOPY WITH D & C N/A 09/07/2017   Procedure: DILATATION AND CURETTAGE /HYSTEROSCOPY;  Surgeon: Conan Bowens, MD;  Location: [facility];  Service: Gynecology;  Laterality: N/A;   IR ANGIO INTRA EXTRACRAN SEL COM CAROTID INNOMINATE UNI R MOD SED  06/18/2021   IR ANGIO INTRA  EXTRACRAN SEL INTERNAL CAROTID BILAT MOD SED  08/19/2021   IR ANGIO INTRA EXTRACRAN SEL INTERNAL CAROTID  UNI L MOD SED  06/18/2021   IR ANGIO INTRA EXTRACRAN SEL INTERNAL CAROTID UNI R MOD SED  07/20/2021   IR ANGIO VERTEBRAL SEL VERTEBRAL UNI R MOD SED  06/18/2021   IR ANGIO VERTEBRAL SEL VERTEBRAL UNI R MOD SED  08/19/2021   IR ANGIOGRAM FOLLOW UP STUDY  07/20/2021   IR CT HEAD LTD  07/20/2021   IR RADIOLOGIST EVAL & MGMT  06/19/2021   IR TRANSCATH/EMBOLIZ  07/20/2021   IR US GUIDE VASC ACCESS RIGHT  06/18/2021   IR US GUIDE VASC ACCESS RIGHT  08/19/2021   KNEE ARTHROSCOPY W/ MENISCAL REPAIR Right    RADIOLOGY WITH ANESTHESIA N/A 07/20/2021   Procedure: IR WITH ANESTHESIA;  Surgeon: Baldemar Lenis, MD;  Location: [facility];  Service: Radiology;  Laterality: N/A;    Family History family history includes Breast cancer in her cousin and maternal aunt; Cancer in her mother; Diabetes in her mother; Hypertension in her mother.  Social History Social History   Socioeconomic History   Marital status: Married    Spouse name: Not on file   Number of children: Not on file   Years of education: Not on file   Highest education level: Not on file  Occupational History   Not on file  Tobacco Use   Smoking status: Never   Smokeless tobacco: Never  Vaping Use   Vaping status: Never Used  Substance and Sexual Activity   Alcohol use: Not Currently    Comment: occ   Drug use: No   Sexual activity: Not on file  Other Topics Concern   Not on file  Social History Narrative   ** Merged History Encounter **       Social Drivers of Health   Financial Resource Strain: Not on file  Food Insecurity: Not on file  Transportation Needs: Not on file  Physical Activity: Not on file  Stress: Not on file  Social Connections: Unknown (11/15/2021)   Received from [facility], [facility]   Social Network    Social Network: Not on file  Intimate Partner Violence: Unknown (10/07/2021)   Received from [facility], [facility]   HITS    Physically Hurt: Not on file    Insult or Talk Down  To: Not on file    Threaten Physical Harm: Not on file    Scream or Curse: Not on file    Lab Results  Component Value Date   HGBA1C 8.7 (A) 07/28/2023   HGBA1C 11.1 (H) 03/31/2023   HGBA1C 8.6 (H) 03/23/2022   Lab Results  Component Value Date   CHOL 159 04/29/2023   Lab Results  Component Value Date   HDL 53.60 04/29/2023   Lab Results  Component Value Date   LDLCALC 83 04/29/2023   Lab Results  Component Value Date   TRIG 113.0 04/29/2023   Lab Results  Component Value Date   CHOLHDL 3 04/29/2023   Lab Results  Component Value Date   CREATININE 1.10 (H) 07/25/2023   Lab Results  Component Value Date   GFR 64.41 03/31/2023   Lab Results  Component Value Date   MICROALBUR 62.2 (H) 04/29/2023      Component Value Date/Time   NA 141 07/25/2023 1826   K 4.2 07/25/2023 1826   CL 105 07/25/2023 1826   CO2 22 07/25/2023 1824   GLUCOSE 217 (H)  07/25/2023 1826   BUN 20 07/25/2023 1826   CREATININE 1.10 (H) 07/25/2023 1826   CREATININE 1.00 10/16/2013 1759   CALCIUM 8.8 (L) 07/25/2023 1824   PROT 7.4 07/25/2023 1824   ALBUMIN 3.8 07/25/2023 1824   AST 33 07/25/2023 1824   ALT 23 07/25/2023 1824   ALKPHOS 68 07/25/2023 1824   BILITOT 0.9 07/25/2023 1824   GFRNONAA 57 (L) 07/25/2023 1824   GFRAA >60 08/09/2018 1017      Latest Ref Rng & Units 07/25/2023    6:26 PM 07/25/2023    6:24 PM 06/21/2023    6:00 PM  BMP  Glucose 70 - 99 mg/dL 409  811  914   BUN 8 - 23 mg/dL 20  14  19    Creatinine 0.44 - 1.00 mg/dL 7.82  9.56  2.13   Sodium 135 - 145 mmol/L 141  137  136   Potassium 3.5 - 5.1 mmol/L 4.2  4.3  3.7   Chloride 98 - 111 mmol/L 105  105  107   CO2 22 - 32 mmol/L  22  20   Calcium 8.9 - 10.3 mg/dL  8.8  8.8        Component Value Date/Time   WBC 5.2 07/25/2023 1824   RBC 4.08 07/25/2023 1824   HGB 12.2 07/25/2023 1826   HGB 12.0 05/30/2017 1858   HCT 36.0 07/25/2023 1826   HCT 36.3 05/30/2017 1858   PLT 228 07/25/2023 1824   PLT 248  05/30/2017 1858   MCV 89.7 07/25/2023 1824   MCV 90 05/30/2017 1858   MCH 29.4 07/25/2023 1824   MCHC 32.8 07/25/2023 1824   RDW 13.8 07/25/2023 1824   RDW 13.7 05/30/2017 1858   LYMPHSABS 2.0 07/25/2023 1824   LYMPHSABS 3.2 (H) 05/30/2017 1858   MONOABS 0.6 07/25/2023 1824   EOSABS 0.2 07/25/2023 1824   EOSABS 0.2 05/30/2017 1858   BASOSABS 0.0 07/25/2023 1824   BASOSABS 0.0 05/30/2017 1858     Parts of this note may have been dictated using voice recognition software. There may be variances in spelling and vocabulary which are unintentional. Not all errors are proofread. Please notify the Thereasa Parkin if any discrepancies are noted or if the meaning of any statement is not clear.

## 2023-07-29 ENCOUNTER — Other Ambulatory Visit: Payer: Self-pay

## 2023-07-29 ENCOUNTER — Encounter: Payer: Self-pay | Admitting: "Endocrinology

## 2023-08-08 DIAGNOSIS — G510 Bell's palsy: Secondary | ICD-10-CM | POA: Insufficient documentation

## 2023-08-27 ENCOUNTER — Other Ambulatory Visit: Payer: Self-pay | Admitting: "Endocrinology

## 2023-08-29 ENCOUNTER — Encounter: Payer: Self-pay | Admitting: Cardiovascular Disease

## 2023-08-29 ENCOUNTER — Telehealth: Payer: Self-pay

## 2023-08-29 ENCOUNTER — Other Ambulatory Visit: Payer: Self-pay

## 2023-08-29 DIAGNOSIS — E1165 Type 2 diabetes mellitus with hyperglycemia: Secondary | ICD-10-CM

## 2023-08-29 MED ORDER — DEXCOM G7 SENSOR MISC: 1.0000 | 3 refills | Status: DC

## 2023-08-29 MED ORDER — TOUJEO SOLOSTAR 300 UNIT/ML ~~LOC~~ SOPN: 24.0000 [IU] | PEN_INJECTOR | Freq: Every day | SUBCUTANEOUS | 3 refills | Status: DC

## 2023-08-29 NOTE — Telephone Encounter (Signed)
Patient came in to office today and picked up 2 samples of Dexcom G7 sensors.

## 2023-08-29 NOTE — Telephone Encounter (Signed)
 [facility]com G7 sent [facility] current [facility]. Patient left Dexcom G7 x 2 at front desk for patient.

## 2023-08-29 NOTE — Progress Notes (Unsigned)
  Cardiology Office Note:  .   Date:  08/30/2023  ID:  Shannon Oliver, DOB [birth year], MRN [id number] PCP: Pcp, No  [facility] Providers Cardiologist:  None    History of Present Illness: .   Shannon Oliver is a 63 y.o. female with HTN, HLD Obesity  We were asked to see her for further evaluation of an abnormal ECG ( NSR, poor R wave progression, NS ST wave abn in lateral leads ) - not significantly changed from previous ecgs   Echo from Jan. 2023 : Normal LV systolic function with EF 65-70%,  grade I DD Trivial MR  Mild AV sclerosis  She saw her primary MD for chest pain  Troponin was elevated and she was sent to the ER   In looking back at his history,  he troponins have been chronically elevated in the mid 20s   She has a history of cerebral aneurysm.  She had coiling of this aneurysm in 2023.  This resulted in a stroke.  Bilateral scattered small and punctate hemispheric infarcts, likely embolic in setting of endovascular procedure. Status post coiling of left terminus ICA aneurysm   Has occasional CP , not exertional Sometimes associated with a cough .   She has occasional episodes of orthostatic hypotension She cut her carvedilol   Admits to eating more salt than she should  Will give her some low salt recommendations ( DASH diet)  We discussed the importance of weight loss         ROS:    Studies Reviewed: .         Risk Assessment/Calculations:             Physical Exam:   VS:  BP 138/68   Pulse 87   Ht 5\' 6"  (1.676 m)   Wt 259 lb 9.6 oz (117.8 kg)   LMP 08/02/2013 Comment: spotting  SpO2 96%   BMI 41.90 kg/m    Wt Readings from Last 3 Encounters:  08/30/23 259 lb 9.6 oz (117.8 kg)  07/28/23 260 lb 3.2 oz (118 kg)  07/25/23 254 lb (115.2 kg)    GEN: middle age, morbidly obese female,  in no acute distress NECK: No JVD; No carotid bruits CARDIAC: RRR, no murmurs, rubs, gallops RESPIRATORY:  Clear to auscultation without rales,  wheezing or rhonchi  ABDOMEN: Soft, non-tender, non-distended EXTREMITIES:  No edema; No deformity   ASSESSMENT AND PLAN: .   1.  History history of chest pain with abnormal troponin levels: She has fairly chronic chest pain but does have some elevated troponin levels.  She has numerous risk factors including history of stroke, obesity, hypertension.  Will get a coronary CT angiogram for further evaluation.  2.  Abnormal EKG: Her EKG is remained basically unchanged since 2013.  I suspect this is due to her hypertension and left ventricular hypertrophy.  Repeat echo   I have encouraged her to work on weight loss, regular exercise.  Her blood pressure is well-controlled at this point.  Will defer to her primary medical doctor for management of her hypertension.       Dispo: PRN    Signed, Kristeen Miss, MD

## 2023-08-30 ENCOUNTER — Encounter: Payer: Self-pay | Admitting: Cardiovascular Disease

## 2023-08-30 ENCOUNTER — Ambulatory Visit: Payer: BLUE CROSS/BLUE SHIELD | Attending: Cardiovascular Disease | Admitting: Cardiovascular Disease

## 2023-08-30 VITALS — BP 138/68 | HR 87 | Ht 66.0 in | Wt 259.6 lb

## 2023-08-30 DIAGNOSIS — R7989 Other specified abnormal findings of blood chemistry: Secondary | ICD-10-CM

## 2023-08-30 DIAGNOSIS — R079 Chest pain, unspecified: Secondary | ICD-10-CM | POA: Diagnosis not present

## 2023-08-30 DIAGNOSIS — Z01810 Encounter for preprocedural cardiovascular examination: Secondary | ICD-10-CM

## 2023-08-30 MED ORDER — METOPROLOL TARTRATE 100 MG PO TABS: ORAL_TABLET | ORAL | 0 refills | Status: DC

## 2023-08-30 NOTE — Patient Instructions (Addendum)
 Lab Work: BMET today If you have labs (blood work) drawn today and your tests are completely normal, you will receive your results only by: [employer] (if you have MyChart) OR A paper copy in the mail If you have any lab test that is abnormal or we need to change your treatment, we will call you to review the results.   Testing/Procedures: ECHO Your physician has requested that you have an echocardiogram. Echocardiography is a painless test that uses sound waves to create images of your heart. It provides your doctor with information about the size and shape of your heart and how well your heart's chambers and valves are working. This procedure takes approximately one hour. There are no restrictions for this procedure. Please do NOT wear cologne, perfume, aftershave, or lotions (deodorant is allowed). Please arrive 15 minutes prior to your appointment time.  Please note: We ask at that you not bring children with you during ultrasound (echo/ vascular) testing. Due to room size and safety concerns, children are not allowed in the ultrasound rooms during exams. Our front office staff cannot provide observation of children in our lobby area while testing is being conducted. An adult accompanying a patient to their appointment will only be allowed in the ultrasound room at the discretion of the ultrasound technician under special circumstances. We apologize for any inconvenience.  Coronary CT Angiogram Your physician has requested that you have cardiac CT. Cardiac computed tomography (CT) is a painless test that uses an x-ray machine to take clear, detailed pictures of your heart. For further information please visit [email or web address] Please follow instruction sheet as given.  Follow-Up: At [facility], you and your health needs are our priority.  As part of our continuing mission to provide you with exceptional heart care, we have created designated Provider Care Teams.  These Care  Teams include your primary Cardiologist (physician) and Advanced Practice Providers (APPs -  Physician Assistants and Nurse Practitioners) who all work together to provide you with the care you need, when you need it.  Your next appointment:   As Needed  Provider:   Kristeen Miss, MD     Your cardiac CT will be scheduled at:  [facility] [address] [place], [place] [address] [phone] please arrive at the [facility] and Children's Entrance (Entrance C2) of [facility] 30 minutes prior to test start time. You can use the FREE valet parking offered at entrance C (encouraged to control the heart rate for the test)  Proceed to the [facility] Radiology Department (first floor) to check-in and test prep.  All radiology patients and guests should use entrance C2 at [facility], accessed from [facility], even though the hospital's physical address listed is [address].   Please follow these instructions carefully (unless otherwise directed): An IV will be required for this test and Nitroglycerin will be given.  On the Night Before the Test: Be sure to Drink plenty of water. Do not consume any caffeinated/decaffeinated beverages or chocolate 12 hours prior to your test. Do not take any antihistamines 12 hours prior to your test. On the Day of the Test: Drink plenty of water until 1 hour prior to the test. Do not eat any food 1 hour prior to test. You may take your regular medications prior to the test.  Take metoprolol (Lopressor) two hours prior to test. If you take Furosemide/Hydrochlorothiazide/Spironolactone/Chlorthalidone, please HOLD on the morning of the test. Patients who wear  a continuous glucose monitor MUST remove the device prior to scanning. FEMALES- please wear underwire-free bra if available, avoid dresses & tight clothing   After the Test: Drink plenty of water. After receiving IV contrast, you may experience a  mild flushed feeling. This is normal. On occasion, you may experience a mild rash up to 24 hours after the test. This is not dangerous. If this occurs, you can take Benadryl 25 mg, Zyrtec, Claritin, or Allegra and increase your fluid intake. (Patients taking Tikosyn should avoid Benadryl, and may take Zyrtec, Claritin, or Allegra) If you experience trouble breathing, this can be serious. If it is severe call 911 IMMEDIATELY. If it is mild, please call our office.  We will call to schedule your test 2-4 weeks out understanding that some insurance companies will need an authorization prior to the service being performed.   For more information and frequently asked questions, please visit our website : [email or web address]  For non-scheduling related questions, please contact the cardiac imaging nurse navigator should you have any questions/concerns: Cardiac Imaging Nurse Navigators Direct Office Dial: [phone]   For scheduling needs, including cancellations and rescheduling, please call [place], [phone].

## 2023-08-31 LAB — BASIC METABOLIC PANEL
BUN/Creatinine Ratio: 18 (ref 12–28)
BUN: 19 mg/dL (ref 8–27)
CO2: 25 mmol/L (ref 20–29)
Calcium: 9.4 mg/dL (ref 8.7–10.3)
Chloride: 104 mmol/L (ref 96–106)
Creatinine, Ser: 1.05 mg/dL — ABNORMAL HIGH (ref 0.57–1.00)
Glucose: 281 mg/dL — ABNORMAL HIGH (ref 70–99)
Potassium: 4.3 mmol/L (ref 3.5–5.2)
Sodium: 142 mmol/L (ref 134–144)
eGFR: 60 mL/min/{1.73_m2} (ref >59)

## 2023-09-01 ENCOUNTER — Encounter: Payer: Self-pay | Admitting: Cardiovascular Disease

## 2023-09-08 ENCOUNTER — Encounter: Payer: Self-pay | Admitting: "Endocrinology

## 2023-09-08 ENCOUNTER — Ambulatory Visit (INDEPENDENT_AMBULATORY_CARE_PROVIDER_SITE_OTHER): Payer: BLUE CROSS/BLUE SHIELD | Admitting: "Endocrinology

## 2023-09-08 VITALS — BP 136/84 | HR 73 | Ht 66.0 in | Wt 262.4 lb

## 2023-09-08 DIAGNOSIS — Z794 Long term (current) use of insulin: Secondary | ICD-10-CM

## 2023-09-08 DIAGNOSIS — Z7984 Long term (current) use of oral hypoglycemic drugs: Secondary | ICD-10-CM

## 2023-09-08 DIAGNOSIS — E1165 Type 2 diabetes mellitus with hyperglycemia: Secondary | ICD-10-CM

## 2023-09-08 DIAGNOSIS — E7800 Pure hypercholesterolemia, unspecified: Secondary | ICD-10-CM | POA: Diagnosis not present

## 2023-09-08 NOTE — Patient Instructions (Signed)
 Will recommend the following: On Toujeo 22 units qam  Fiasp 18 units three times a day before each meal (put alarms to remind 15 min before meals) Metformin XR 500mg  twice a day (max preferred dose)

## 2023-09-08 NOTE — Progress Notes (Signed)
 Outpatient Endocrinology Note Shannon [place], MD  09/08/23   Shannon Oliver 07-Nov-1960 [id number]  Referring Provider: No ref. provider found Primary Care Provider: Pcp, No Reason for consultation: Subjective   Assessment & Plan  Diagnoses and all orders for this visit:  Uncontrolled type 2 diabetes mellitus with hyperglycemia, with long-term current use of insulin (HCC)  Long-term insulin use (HCC)  Long term (current) use of oral hypoglycemic drugs  Pure hypercholesterolemia     Diabetes Type II complicated by neuropathy Lab Results  Component Value Date   GFR 64.41 03/31/2023   Hba1c goal less than 7, current Hba1c is  Lab Results  Component Value Date   HGBA1C 8.7 (A) 07/28/2023   Will recommend the following: On Toujeo 22 units qam  Fiasp 18 units three times a day before each meal (put alarms to remind 15 min before meals) Metformin XR 500mg  twice a day (max preferred dose)  Gabapentin for neuropathy  No known contraindications/side effects to any of above medications  -Last LD and Tg are as follows: Lab Results  Component Value Date   LDLCALC 83 04/29/2023    Lab Results  Component Value Date   TRIG 113.0 04/29/2023   -rosuvastatin 40 mg QD -Follow low fat diet and exercise   -Blood pressure goal <140/90 - Microalbumin/creatinine goal is < 30 -Last MA/Cr is as follows: Lab Results  Component Value Date   MICROALBUR 62.2 (H) 04/29/2023   - on ACE/ARB losartan 100 mg qd -diet changes including salt restriction -limit eating outside -counseled BP targets per standards of diabetes care -uncontrolled blood pressure can lead to retinopathy, nephropathy and cardiovascular and atherosclerotic heart disease  Reviewed and counseled on: -A1C target -Blood sugar targets -Complications of uncontrolled diabetes  -Checking blood sugar before meals and bedtime and bring log next visit -All medications with mechanism of action and side  effects -Hypoglycemia management: rule of 15's, Glucagon Emergency Kit and medical alert ID -low-carb low-fat plate-method diet -At least 20 minutes of physical activity per day -Annual dilated retinal eye exam and foot exam -compliance and follow up needs -follow up as scheduled or earlier if problem gets worse  Call if blood sugar is less than 70 or consistently above 250    Take a 15 gm snack of carbohydrate at bedtime before you go to sleep if your blood sugar is less than 100.    If you are going to fast after midnight for a test or procedure, ask your physician for instructions on how to reduce/decrease your insulin dose.    Call if blood sugar is less than 70 or consistently above 250  -Treating a low sugar by rule of 15  (15 gms of sugar every 15 min until sugar is more than 70) If you feel your sugar is low, test your sugar to be sure If your sugar is low (less than 70), then take 15 grams of a fast acting Carbohydrate (3-4 glucose tablets or glucose gel or 4 ounces of juice or regular soda) Recheck your sugar 15 min after treating low to make sure it is more than 70 If sugar is still less than 70, treat again with 15 grams of carbohydrate          Don't drive the hour of hypoglycemia  If unconscious/unable to eat or drink by mouth, use glucagon injection or nasal spray baqsimi and call 911. Can repeat again in 15 min if still unconscious.  No follow-ups on file.  I have reviewed current medications, nurse's notes, allergies, vital signs, past medical and surgical history, family medical history, and social history for this encounter. Counseled patient on symptoms, examination findings, lab findings, imaging results, treatment decisions and monitoring and prognosis. The patient understood the recommendations and agrees with the treatment plan. All questions regarding treatment plan were fully answered.  Shannon [place], MD  09/08/23    History of Present Illness Shannon Oliver is a 63 y.o. year old female who presents for evaluation of Type II diabetes mellitus.  Dane Bloch Herst was first diagnosed in 2008.   Diabetes education +  Home diabetes regimen: On Toujeo 22 units qam  Fiasp 16 units before meals at least twice a day  Metformin XR 500mg -self cut it to current dose   Stopped Insulin 70/30 or fiasp 10-12 units bidac Couldn't get fiasp 8 units tidac/ farxiga/jardiance 10 mg every day  Background history:   She thinks she has been on mostly oral hypoglycemic drugs notably metformin and Amaryl for most of the duration of her diabetes She was probably started on insulin in 2018 with Levemir insulin when her A1c was 9.6 She has not had an endocrinology follow-up since 03/2017  COMPLICATIONS +  MI/Stroke +  retinopathy +  neuropathy -  nephropathy  BLOOD SUGAR DATA  CGM interpretation: At today's visit, we reviewed her CGM downloads. The full report is scanned in the media. Reviewing the CGM trends, BG are elevated after lunch and dinner and overnight.  Physical Exam  BP 136/84 (BP Location: Left Arm, Patient Position: Sitting)   Pulse 73   Ht 5\' 6"  (1.676 m)   Wt 262 lb 6.4 oz (119 kg)   LMP 08/02/2013 Comment: spotting  SpO2 97%   BMI 42.35 kg/m    Constitutional: well developed, well nourished Head: normocephalic, atraumatic Eyes: sclera anicteric, no redness Neck: supple Lungs: normal respiratory effort Neurology: alert and oriented Skin: dry, no appreciable rashes Musculoskeletal: no appreciable defects Psychiatric: normal mood and affect Diabetic Foot Exam - Simple   No data filed      Current Medications Patient's Medications  New Prescriptions   No medications on file  Previous Medications   ACCU-CHEK GUIDE TEST STRIP    USE AS INSTRUCTED TO CHECK BLOOD SUGAR TWICE DAILY.   ACETAMINOPHEN (TYLENOL) 500 MG TABLET    Take 1,000 mg by mouth every 6 (six) hours as needed for mild pain (pain score 1-3), moderate pain  (pain score 4-6) or headache.   ALBUTEROL (VENTOLIN HFA) 108 (90 BASE) MCG/ACT INHALER    Inhale 1-2 puffs into the lungs every 6 (six) hours as needed for wheezing or shortness of breath.   AMLODIPINE (NORVASC) 10 MG TABLET    Take 1 tablet by mouth once daily.   ASPIRIN EC 81 MG EC TABLET    Take 1 tablet (81 mg total) by mouth daily. Swallow whole.   CARVEDILOL (COREG) 25 MG TABLET    Take 25 mg by mouth. 1 tab in the morning, 1/2 tab at night   CONTINUOUS BLOOD GLUC SENSOR (DEXCOM G7 SENSOR) MISC    1 DEVICE BY DOES NOT APPLY ROUTE AS DIRECTED. CHANGE SENSOR EVERY 10 DAYS   CONTINUOUS GLUCOSE SENSOR (DEXCOM G7 SENSOR) MISC    Inject 1 Device into the skin See admin instructions. Place 1 new sensor into the skin every 10 days   EMPAGLIFLOZIN (JARDIANCE) 10 MG TABS TABLET    Take 1 tablet (10 mg total) by mouth  daily before breakfast.   GABAPENTIN (NEURONTIN) 100 MG CAPSULE    Take 1 capsule (100 mg total) by mouth 3 (three) times daily.   HYDRALAZINE (APRESOLINE) 25 MG TABLET    Take 25 mg by mouth daily.   INSULIN ASPART (FIASP FLEXTOUCH) 100 UNIT/ML FLEXTOUCH PEN    Inject 16 Units into the skin 3 (three) times daily before meals.   INSULIN GLARGINE, 1 UNIT DIAL, (TOUJEO SOLOSTAR) 300 UNIT/ML SOLOSTAR PEN    Inject 24 Units into the skin daily.   LOSARTAN (COZAAR) 100 MG TABLET    Take 1 tablet (100 mg total) by mouth daily.   METFORMIN (GLUCOPHAGE-XR) 500 MG 24 HR TABLET    Take 1 tablet (500 mg total) by mouth 2 (two) times daily with a meal.   METOPROLOL TARTRATE (LOPRESSOR) 100 MG TABLET    Take 1 tablet by mouth two hours prior to scan   ROSUVASTATIN (CRESTOR) 40 MG TABLET    Take 1 tablet (40 mg total) by mouth daily.   TIZANIDINE (ZANAFLEX) 4 MG TABLET    Take 4 mg by mouth at bedtime as needed for muscle spasms.  Modified Medications   No medications on file  Discontinued Medications   No medications on file    Allergies Allergies  Allergen Reactions   Canagliflozin Other (See  Comments)    "Dizziness, stabbing pain in right side of body"   Hydrocodone Hypertension    Past Medical History Past Medical History:  Diagnosis Date   Abnormal EKG    LVH with strain   Acid reflux    Asthma    Depression    Diabetes mellitus    A1C over 9   HLD (hyperlipidemia)    Hypertension    Noncompliance    Obesity    Pneumonia    Sleep apnea     Past Surgical History Past Surgical History:  Procedure Laterality Date   CARDIAC CATHETERIZATION     CATARACT EXTRACTION Left 06/04/2021   COLONOSCOPY WITH PROPOFOL N/A 04/29/2015   Procedure: COLONOSCOPY WITH PROPOFOL;  Surgeon: Charolett Bumpers, MD;  Location: [facility];  Service: Endoscopy;  Laterality: N/A;   EYE SURGERY     HYSTEROSCOPY WITH D & C N/A 09/07/2017   Procedure: DILATATION AND CURETTAGE /HYSTEROSCOPY;  Surgeon: Conan Bowens, MD;  Location: [facility];  Service: Gynecology;  Laterality: N/A;   IR ANGIO INTRA EXTRACRAN SEL COM CAROTID INNOMINATE UNI R MOD SED  06/18/2021   IR ANGIO INTRA EXTRACRAN SEL INTERNAL CAROTID BILAT MOD SED  08/19/2021   IR ANGIO INTRA EXTRACRAN SEL INTERNAL CAROTID UNI L MOD SED  06/18/2021   IR ANGIO INTRA EXTRACRAN SEL INTERNAL CAROTID UNI R MOD SED  07/20/2021   IR ANGIO VERTEBRAL SEL VERTEBRAL UNI R MOD SED  06/18/2021   IR ANGIO VERTEBRAL SEL VERTEBRAL UNI R MOD SED  08/19/2021   IR ANGIOGRAM FOLLOW UP STUDY  07/20/2021   IR CT HEAD LTD  07/20/2021   IR RADIOLOGIST EVAL & MGMT  06/19/2021   IR TRANSCATH/EMBOLIZ  07/20/2021   IR US GUIDE VASC ACCESS RIGHT  06/18/2021   IR US GUIDE VASC ACCESS RIGHT  08/19/2021   KNEE ARTHROSCOPY W/ MENISCAL REPAIR Right    RADIOLOGY WITH ANESTHESIA N/A 07/20/2021   Procedure: IR WITH ANESTHESIA;  Surgeon: Baldemar Lenis, MD;  Location: [facility] OR;  Service: Radiology;  Laterality: N/A;    Family History family history includes Breast cancer in her cousin  and maternal aunt; Cancer in her mother; Diabetes in her  mother; Hypertension in her mother.  Social History Social History   Socioeconomic History   Marital status: Married    Spouse name: Not on file   Number of children: Not on file   Years of education: Not on file   Highest education level: Not on file  Occupational History   Not on file  Tobacco Use   Smoking status: Never   Smokeless tobacco: Never  Vaping Use   Vaping status: Never Used  Substance and Sexual Activity   Alcohol use: Not Currently    Comment: occ   Drug use: No   Sexual activity: Not on file  Other Topics Concern   Not on file  Social History Narrative   ** Merged History Encounter **       Social Drivers of Health   Financial Resource Strain: Not on file  Food Insecurity: Not on file  Transportation Needs: Not on file  Physical Activity: Not on file  Stress: Not on file  Social Connections: Unknown (11/15/2021)   [facility]   Social Network    Social Network: Not on file  Intimate Partner Violence: Unknown (10/07/2021)   Received from [facility], [facility]   HITS    Physically Hurt: Not on file    Insult or Talk Down To: Not on file    Threaten Physical Harm: Not on file    Scream or Curse: Not on file    Lab Results  Component Value Date   HGBA1C 8.7 (A) 07/28/2023   HGBA1C 11.1 (H) 03/31/2023   HGBA1C 8.6 (H) 03/23/2022   Lab Results  Component Value Date   CHOL 159 04/29/2023   Lab Results  Component Value Date   HDL 53.60 04/29/2023   Lab Results  Component Value Date   LDLCALC 83 04/29/2023   Lab Results  Component Value Date   TRIG 113.0 04/29/2023   Lab Results  Component Value Date   CHOLHDL 3 04/29/2023   Lab Results  Component Value Date   CREATININE 1.05 (H) 08/30/2023   Lab Results  Component Value Date   GFR 64.41 03/31/2023   Lab Results  Component Value Date   MICROALBUR 62.2 (H) 04/29/2023      Component Value Date/Time   NA 142 08/30/2023 1537   K 4.3  08/30/2023 1537   CL 104 08/30/2023 1537   CO2 25 08/30/2023 1537   GLUCOSE 281 (H) 08/30/2023 1537   GLUCOSE 217 (H) 07/25/2023 1826   BUN 19 08/30/2023 1537   CREATININE 1.05 (H) 08/30/2023 1537   CREATININE 1.00 10/16/2013 1759   CALCIUM 9.4 08/30/2023 1537   PROT 7.4 07/25/2023 1824   ALBUMIN 3.8 07/25/2023 1824   AST 33 07/25/2023 1824   ALT 23 07/25/2023 1824   ALKPHOS 68 07/25/2023 1824   BILITOT 0.9 07/25/2023 1824   GFRNONAA 57 (L) 07/25/2023 1824   GFRAA >60 08/09/2018 1017      Latest Ref Rng & Units 08/30/2023    3:37 PM 07/25/2023    6:26 PM 07/25/2023    6:24 PM  BMP  Glucose 70 - 99 mg/dL 865  784  696   BUN 8 - 27 mg/dL 19  20  14    Creatinine 0.57 - 1.00 mg/dL 2.95  2.84  1.32   BUN/Creat Ratio 12 - 28 18     Sodium 134 - 144 mmol/L 142  141  137   Potassium 3.5 - 5.2 mmol/L 4.3  4.2  4.3   Chloride 96 - 106 mmol/L 104  105  105   CO2 20 - 29 mmol/L 25   22   Calcium 8.7 - 10.3 mg/dL 9.4   8.8        Component Value Date/Time   WBC 5.2 07/25/2023 1824   RBC 4.08 07/25/2023 1824   HGB 12.2 07/25/2023 1826   HGB 12.0 05/30/2017 1858   HCT 36.0 07/25/2023 1826   HCT 36.3 05/30/2017 1858   PLT 228 07/25/2023 1824   PLT 248 05/30/2017 1858   MCV 89.7 07/25/2023 1824   MCV 90 05/30/2017 1858   MCH 29.4 07/25/2023 1824   MCHC 32.8 07/25/2023 1824   RDW 13.8 07/25/2023 1824   RDW 13.7 05/30/2017 1858   LYMPHSABS 2.0 07/25/2023 1824   LYMPHSABS 3.2 (H) 05/30/2017 1858   MONOABS 0.6 07/25/2023 1824   EOSABS 0.2 07/25/2023 1824   EOSABS 0.2 05/30/2017 1858   BASOSABS 0.0 07/25/2023 1824   BASOSABS 0.0 05/30/2017 1858     Parts of this note may have been dictated using voice recognition software. There may be variances in spelling and vocabulary which are unintentional. Not all errors are proofread. Please notify the Thereasa Parkin if any discrepancies are noted or if the meaning of any statement is not clear.

## 2023-09-15 ENCOUNTER — Other Ambulatory Visit (HOSPITAL_COMMUNITY): Payer: Self-pay

## 2023-09-15 ENCOUNTER — Telehealth: Payer: Self-pay

## 2023-09-15 NOTE — Telephone Encounter (Signed)
 Pt need PA done for Dexcom 7.

## 2023-09-15 NOTE — Telephone Encounter (Signed)
 Pharmacy Patient Advocate Encounter   Received notification from Pt Calls Messages that prior authorization for Dexcom G7 sensor is required/requested.   Insurance verification completed.   The patient is insured through [facility] .   Per test claim: PA required; PA started via CoverMyMeds. KEY [id number] . Waiting for clinical questions to populate.   Pa cancelled: Submit Bill To Other Processor Or Primary Payer  Unable to verify other insurance

## 2023-09-19 ENCOUNTER — Ambulatory Visit (HOSPITAL_COMMUNITY): Payer: BLUE CROSS/BLUE SHIELD

## 2023-09-20 ENCOUNTER — Other Ambulatory Visit: Payer: Self-pay | Admitting: Cardiovascular Disease

## 2023-09-20 DIAGNOSIS — R7989 Other specified abnormal findings of blood chemistry: Secondary | ICD-10-CM

## 2023-09-20 DIAGNOSIS — Z01810 Encounter for preprocedural cardiovascular examination: Secondary | ICD-10-CM

## 2023-09-20 DIAGNOSIS — R079 Chest pain, unspecified: Secondary | ICD-10-CM

## 2023-09-21 ENCOUNTER — Telehealth: Payer: Self-pay

## 2023-09-21 NOTE — Telephone Encounter (Signed)
 Spoke to pt regarding Dexcom G7. Informed pt that Dexcom was on back over that if she would like she can come back the office and pick up a sample.

## 2023-09-22 ENCOUNTER — Ambulatory Visit (HOSPITAL_COMMUNITY): Payer: BLUE CROSS/BLUE SHIELD | Attending: Cardiovascular Disease

## 2023-09-22 ENCOUNTER — Encounter: Payer: Self-pay | Admitting: Cardiovascular Disease

## 2023-09-22 DIAGNOSIS — R7989 Other specified abnormal findings of blood chemistry: Secondary | ICD-10-CM | POA: Diagnosis present

## 2023-09-22 DIAGNOSIS — Z01810 Encounter for preprocedural cardiovascular examination: Secondary | ICD-10-CM | POA: Insufficient documentation

## 2023-09-22 DIAGNOSIS — R079 Chest pain, unspecified: Secondary | ICD-10-CM | POA: Diagnosis not present

## 2023-09-22 LAB — ECHOCARDIOGRAM COMPLETE
Area-P 1/2: 2.93 cm2
P 1/2 time: 501 ms
S' Lateral: 2.7 cm

## 2023-09-28 ENCOUNTER — Telehealth (HOSPITAL_COMMUNITY): Payer: Self-pay | Admitting: *Deleted

## 2023-09-28 NOTE — Telephone Encounter (Signed)
 Attempted to call patient regarding upcoming cardiac CT appointment. Left message on voicemail with name and callback number  Larey Brick RN Navigator Cardiac Imaging [facility] [phone] Office [phone] Cell

## 2023-09-29 ENCOUNTER — Ambulatory Visit (HOSPITAL_COMMUNITY)
Admission: RE | Admit: 2023-09-29 | Discharge: 2023-09-29 | Disposition: A | Discharge status: Home / Self Care | Source: Ambulatory Visit | Attending: Cardiovascular Disease | Admitting: Cardiovascular Disease

## 2023-09-29 DIAGNOSIS — R7989 Other specified abnormal findings of blood chemistry: Secondary | ICD-10-CM | POA: Insufficient documentation

## 2023-09-29 DIAGNOSIS — I2510 Atherosclerotic heart disease of native coronary artery without angina pectoris: Secondary | ICD-10-CM

## 2023-09-29 DIAGNOSIS — Z01810 Encounter for preprocedural cardiovascular examination: Secondary | ICD-10-CM | POA: Insufficient documentation

## 2023-09-29 DIAGNOSIS — R079 Chest pain, unspecified: Secondary | ICD-10-CM | POA: Diagnosis present

## 2023-09-29 MED ORDER — NITROGLYCERIN 0.4 MG SL SUBL
0.8000 mg | SUBLINGUAL_TABLET | Freq: Once | SUBLINGUAL | Status: AC
  Administered 2023-09-29: 0.8 mg via SUBLINGUAL

## 2023-09-29 MED ORDER — IOHEXOL 350 MG/ML SOLN
95.0000 mL | Freq: Once | INTRAVENOUS | Status: AC | PRN
  Administered 2023-09-29: 95 mL via INTRAVENOUS

## 2023-09-29 MED ORDER — NITROGLYCERIN 0.4 MG SL SUBL
SUBLINGUAL_TABLET | SUBLINGUAL | Status: AC
  Filled 2023-09-29: qty 2

## 2023-10-02 ENCOUNTER — Encounter: Payer: Self-pay | Admitting: Cardiovascular Disease

## 2023-10-03 ENCOUNTER — Telehealth: Payer: Self-pay

## 2023-10-03 DIAGNOSIS — Z79899 Other long term (current) drug therapy: Secondary | ICD-10-CM

## 2023-10-03 DIAGNOSIS — E782 Mixed hyperlipidemia: Secondary | ICD-10-CM

## 2023-10-03 NOTE — Telephone Encounter (Signed)
-----   Message from Kristeen Miss sent at 10/02/2023  7:33 AM EDT ----- CAC is 16  . 77th percentile for age / sex matched controls  Miild coronary artery diease   Her CP appears to be from a noncardiac etiology  Her last LDL is 83. Her goal LDL is < 70 Continue rosuvastatin 40 mg a day .   Add Zetia 10 mg a day Continue with healthy diet, weight loss, exercise  Check lipids , ALT, BMP in 6 months  Follow up with her primary MD or or [place] in 1 year

## 2023-10-03 NOTE — Telephone Encounter (Signed)
 Called and spoke with patient who states she's only been taking her Crestor about twice weekly due to her not remembering to take it. She would like the opportunity to get on it daily and she what her labs show, then if needed, is agreeable to starting zetia. Repeat labs entered and released so she can have drawn in July.

## 2023-10-10 ENCOUNTER — Telehealth: Payer: Self-pay

## 2023-10-10 ENCOUNTER — Other Ambulatory Visit (HOSPITAL_COMMUNITY): Payer: Self-pay

## 2023-10-10 NOTE — Telephone Encounter (Signed)
 Pt called office and left a VM om my phone regarding having trouble getting her Rx for dexcom G 7 filled. I reached out to pharmacy to need what the problem was, pharmacy informed me that pt needed a PA done. PA sent to out PA team. Pt notify.

## 2023-10-10 NOTE — Telephone Encounter (Signed)
 Pharmacy Patient Advocate Encounter   Received notification from Pt Calls Messages that prior authorization for Dexcom G7 sensor is required/requested.   Insurance verification completed.   The patient is insured through [facility] .   Per test claim: PA required and submitted KEY/EOC/Request #: [id number] APPROVED from 10/10/23 to 04/07/2024   PA Case ID #: [id number]

## 2023-10-10 NOTE — Telephone Encounter (Signed)
 Pt needs PA done for  Dexcom G7.

## 2023-10-20 ENCOUNTER — Ambulatory Visit: Attending: Physician Assistant | Admitting: Physical Therapy

## 2023-10-20 ENCOUNTER — Other Ambulatory Visit: Payer: Self-pay

## 2023-10-20 ENCOUNTER — Encounter: Payer: Self-pay | Admitting: Physical Therapy

## 2023-10-20 DIAGNOSIS — M7099 Unspecified soft tissue disorder related to use, overuse and pressure multiple sites: Secondary | ICD-10-CM | POA: Insufficient documentation

## 2023-10-20 DIAGNOSIS — M6281 Muscle weakness (generalized): Secondary | ICD-10-CM | POA: Diagnosis present

## 2023-10-20 DIAGNOSIS — M5459 Other low back pain: Secondary | ICD-10-CM | POA: Diagnosis present

## 2023-10-20 NOTE — Therapy (Addendum)
 OUTPATIENT PHYSICAL THERAPY THORACOLUMBAR EVALUATION   Patient Name: Shannon Oliver MRN: [id number] DOB:05-Aug-1960, 63 y.o., female Today's Date: 10/20/2023  END OF SESSION:  PT End of Session - 10/20/23 0827     Visit Number 1    Number of Visits 7    Date for PT Re-Evaluation 12/01/23    PT Start Time 0830    PT Stop Time 0908    PT Time Calculation (min) 38 min    Activity Tolerance Patient tolerated treatment well    Behavior During Therapy WFL for tasks assessed/performed           PHYSICAL THERAPY DISCHARGE SUMMARY  Visits from Start of Care: 1  Current functional level related to goals / functional outcomes: see assessment   Remaining deficits: see assessment   Education / Equipment: see assessment   Patient agrees to discharge. Patient goals were unknown. Patient is being discharged due to not returning since the last visit.  Albesa Huguenin, PT, DPT 12/08/2023, 10:34 AM    Past Medical History:  Diagnosis Date   Abnormal EKG    LVH with strain   Acid reflux    Asthma    Depression    Diabetes mellitus    A1C over 9   HLD (hyperlipidemia)    Hypertension    Noncompliance    Obesity    Pneumonia    Sleep apnea    Past Surgical History:  Procedure Laterality Date   CARDIAC CATHETERIZATION     CATARACT EXTRACTION Left 06/04/2021   COLONOSCOPY WITH PROPOFOL  N/A 04/29/2015   Procedure: COLONOSCOPY WITH PROPOFOL ;  Surgeon: Garrett Kallman, MD;  Location: [facility];  Service: Endoscopy;  Laterality: N/A;   EYE SURGERY     HYSTEROSCOPY WITH D & C N/A 09/07/2017   Procedure: DILATATION AND CURETTAGE /HYSTEROSCOPY;  Surgeon: Jan Mcgill, MD;  Location: [facility];  Service: Gynecology;  Laterality: N/A;   IR ANGIO INTRA EXTRACRAN SEL COM CAROTID INNOMINATE UNI R MOD SED  06/18/2021   IR ANGIO INTRA EXTRACRAN SEL INTERNAL CAROTID BILAT MOD SED  08/19/2021   IR ANGIO INTRA EXTRACRAN SEL INTERNAL CAROTID UNI L MOD SED  06/18/2021    IR ANGIO INTRA EXTRACRAN SEL INTERNAL CAROTID UNI R MOD SED  07/20/2021   IR ANGIO VERTEBRAL SEL VERTEBRAL UNI R MOD SED  06/18/2021   IR ANGIO VERTEBRAL SEL VERTEBRAL UNI R MOD SED  08/19/2021   IR ANGIOGRAM FOLLOW UP STUDY  07/20/2021   IR CT HEAD LTD  07/20/2021   IR RADIOLOGIST EVAL & MGMT  06/19/2021   IR TRANSCATH/EMBOLIZ  07/20/2021   IR US  GUIDE VASC ACCESS RIGHT  06/18/2021   IR US  GUIDE VASC ACCESS RIGHT  08/19/2021   KNEE ARTHROSCOPY W/ MENISCAL REPAIR Right    RADIOLOGY WITH ANESTHESIA N/A 07/20/2021   Procedure: IR WITH ANESTHESIA;  Surgeon: de Macedo Rodrigues, Katyucia, MD;  Location: [facility] OR;  Service: Radiology;  Laterality: N/A;   Patient Active Problem List   Diagnosis Date Noted   Cerebral thrombosis with cerebral infarction 07/27/2021   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 07/23/2021   Brain aneurysm 07/20/2021   bilateral scattered small and punctate infarcts likely due to embolism s/p procedure of endovascular  coiling of left terminal ICA aneurysm 07/20/2021   Hyperlipidemia 04/30/2021   BMI 39.0-39.9,adult 04/17/2021   Elevated troponin 10/30/2020   AKI (acute kidney injury) (HCC) 10/30/2020   Palpitations    Chest pain 10/29/2020  Encounter for orthopedic follow-up care 08/19/2020   Pain in joint of left shoulder 05/14/2020   Hypertensive left ventricular hypertrophy, without heart failure 12/18/2018   Obesity due to excess calories without serious comorbidity 12/18/2018   Type 2 diabetes mellitus without complication, without long-term current use of insulin  (HCC) 12/15/2018   Postmenopausal bleeding 06/05/2017   Neck pain 03/01/2014   Edema 01/25/2013   DM (diabetes mellitus) (HCC) 01/25/2013   Dyspnea 01/25/2013   Abnormal ECG 01/25/2013   Hypertension    Acid reflux     PCP: no pcp per pt chart  REFERRING PROVIDER: Lucie Ruts, PA-C  REFERRING DIAG: Radiculopathy [M54.10]   Rationale for Evaluation and Treatment:  Rehabilitation  THERAPY DIAG:  Other low back pain  Unspecified soft tissue disorder related to use, overuse and pressure multiple sites  Muscle weakness (generalized)  PERTINENT HISTORY: HTN, DM2, Brain aneurysm, CVA, Bells palsy  WEIGHT BEARING RESTRICTIONS: No  FALLS:  Has patient fallen in last 6 months? No and "always have near misses"  LIVING ENVIRONMENT: Lives with: lives with their spouse Lives in: House/apartment Stairs: Yes: External: 3 steps; mainly uses the ramp Has following equipment at home: Single point cane and Walker - 2 wheeled  OCCUPATION: retired   PRECAUTIONS: None ---------------------------------------------------------------------------------------------  SUBJECTIVE:                                                                                                                                                                                           SUBJECTIVE STATEMENT: Eval statement 10/20/2023: lbp with radicular symptoms onset 2 weeks down LLE, has slowly gotten better, but is disrupting sleep. Currently has to sleep in a recliner which has helped relieve pain in the morning. States that B knees have been bad.  Pain currently 3/10  RED FLAGS: Bowel or bladder incontinence: No and Cauda equina syndrome: No    PLOF: Independent  PATIENT GOALS: see if it helps the pain  NEXT MD VISIT: maybe a month ---------------------------------------------------------------------------------------------  OBJECTIVE:  Note: Objective measures were completed at Evaluation unless otherwise noted.  DIAGNOSTIC FINDINGS:  No recent imaging of low back  PATIENT SURVEYS:  LEFS 23/80  COGNITION: Overall cognitive status: Within functional limits for tasks assessed   PALPATION: Tenderness over L piriformis, reproduced symptoms and throughout lumbar extensors  Lumbar contraction pattern  L Multifidus: poor contraction quality  R Multifidus: Good  contraction quality   SENSATION: Light touch: Impaired   MUSCLE LENGTH: Hamstrings: WNL   POSTURE: rounded shoulders and forward head   LUMBAR ROM:   AROM eval  Flexion 70%  Extension 80%  Right lateral flexion 60%  Left  lateral flexion 50%  Right rotation 50%  Left rotation 70%   (Blank rows = not tested)  ! Indicates pain with testing  LOWER EXTREMITY ROM:     Passive  Right eval Left eval  Hip flexion [facility]  Hip extension [facility]  Hip abduction    Hip adduction    Hip internal rotation Limited! Limited!  Hip external rotation Limited! Limited!  Knee flexion [facility]  Knee extension [facility]  Ankle dorsiflexion    Ankle plantarflexion    Ankle inversion    Ankle eversion     (Blank rows = not tested)  ! Indicates pain with testing  LOWER EXTREMITY MMT:    MMT Right eval Left eval  Hip flexion 3+ 3+  Hip extension 4- 4-  Hip abduction    Hip adduction    Hip internal rotation    Hip external rotation    Knee flexion 4 4  Knee extension 5 4+  Ankle dorsiflexion    Ankle plantarflexion    Ankle inversion    Ankle eversion     (Blank rows = not tested)   ! Indicates pain with testing LUMBAR SPECIAL TESTS:  Prone instability test: Positive and Slump test: Positive  GAIT: Distance walked: 137ft Assistive device utilized: Single point cane Level of assistance: Modified independence  [facility] Adult PT Treatment:                                                DATE: 10/20/2023 Self Care: Pt education POC discussion                                                                                                                                PATIENT EDUCATION:  Education details: Pt received education regarding HEP performance, ADL performance, functional activity tolerance, impairment education, appropriate performance of therapeutic activities.  Person educated: Patient Education method: Explanation, Demonstration, Tactile cues, Verbal cues, and  Handouts Education comprehension: verbalized understanding and returned demonstration  HOME EXERCISE PROGRAM: Access Code: [email or web address] URL: [email or web address] Date: 10/20/2023 Prepared by: Albesa Huguenin  Exercises - Piriformis Mobilization with Small Ball  - 1 x daily - 7 x weekly - 3 sets - 10 reps - Supine Piriformis Stretch with Foot on Ground  - 1 x daily - 7 x weekly - 2 sets - 1 reps - 62m hold - Supine Sciatic Nerve Glide  - 1 x daily - 7 x weekly - 2 sets - 20 reps - 2s hold - Supine Bridge  - 1 x daily - 7 x weekly - 3 sets - 10 reps - Hooklying Single Leg Bent Knee Fallouts with Resistance  - 1 x daily - 7 x weekly - 3 sets - 8 reps - 4s hold ---------------------------------------------------------------------------------------------  ASSESSMENT:  CLINICAL IMPRESSION: Eval  impression (10/20/2023): Pt. attended today's physical therapy session for evaluation of LBP with radicular symptoms down LLE. Pt has complaints of 3/10 pain that gets up to 8/10 in the morning or after laying down for a while with intermittent n/t down LLE. Pt has notable deficits with global hip strength, piriformis motiltiy, dynamic core activation and bed mobility technique.  Pt would benefit from therapeutic focus on piriformis motility, global hip strengthening, dynamic core strengthening and lumbar stability.. Treatment performed today focused on pt education detailed in obj. Pt demonstrated good understanding of education provided. required minimal verbal cues and no physical assistance for appropriate performance with today's activities. Pt requires the intervention of skilled outpatient physical therapy to address the aforementioned deficits and progress towards a functional level in line with therapeutic goals.    OBJECTIVE IMPAIRMENTS: Abnormal gait, cardiopulmonary status limiting activity, decreased activity tolerance, decreased balance, difficulty walking, decreased ROM, decreased  strength, improper body mechanics, postural dysfunction, and pain.   ACTIVITY LIMITATIONS: carrying, bending, sitting, squatting, sleeping, transfers, bed mobility, and locomotion level  PARTICIPATION LIMITATIONS: meal prep, cleaning, interpersonal relationship, shopping, and community activity  PERSONAL FACTORS: Fitness, Past/current experiences, Social background, Time since onset of injury/illness/exacerbation, and 3+ comorbidities: see pmh are also affecting patient's functional outcome.   REHAB POTENTIAL: Fair extensive pmh  CLINICAL DECISION MAKING: Stable/uncomplicated  EVALUATION COMPLEXITY: Low   GOALS: Goals reviewed with patient? YES  SHORT TERM GOALS: Target date: 11/10/2023    Pt will be independent with administered HEP to demonstrate the competency necessary for long term managemnet of symptoms at home. Baseline: Goal status: INITIAL   LONG TERM GOALS: Target date: 12/01/2023    Pt. Will achieve a LEFS score of 40/80 as to demonstrate improvement in self-perceived functional ability with daily activities.  Baseline: 23/80 Goal status: INITIAL  2.  Pt will improve Global hip strength to a 4+/5 to demonstrate improvement in strength for quality of motion and activity performance.  Baseline:  Goal status: INITIAL  3.  Pt will improve Lumbar AROM to 90% of standardized norms with less than 2/10 pain to demonstrate necessary mobility for high quality and safe ADLs  Baseline:  Goal status: INITIAL  4.  Pt will report pain levels improving during ADLs to be less than or equal to 2/10 as to demonstrate improved tolerance with daily functional activities such as sleeping, bed mobility, cleaning.  Baseline:  Goal status: INITIAL  ---------------------------------------------------------------------------------------------  PLAN:  PT FREQUENCY: 1-2x/week  PT DURATION: 6 weeks  PLANNED INTERVENTIONS: 97110-Therapeutic exercises, 97530- Therapeutic activity,  [id number]- Neuromuscular re-education, 97535- Self Care, 16109- Manual therapy, [phone]- Gait training, [phone]- Aquatic Therapy, Patient/Family education, Dry Needling, Joint mobilization, and Spinal mobilization.  PLAN FOR NEXT SESSION: Review HEP, Begin POC as detailed in assessment   Albesa Huguenin, PT, DPT 10/20/2023, 9:12 AM    For all possible CPT codes, reference the Planned Interventions line above.     Check all conditions that are expected to impact treatment: {Conditions expected to impact treatment:Diabetes mellitus   If treatment provided at initial evaluation, no treatment charged due to lack of authorization.

## 2023-12-09 ENCOUNTER — Other Ambulatory Visit: Payer: Self-pay

## 2023-12-09 ENCOUNTER — Ambulatory Visit: Admitting: "Endocrinology

## 2023-12-09 ENCOUNTER — Other Ambulatory Visit: Payer: Self-pay | Admitting: "Endocrinology

## 2023-12-09 DIAGNOSIS — E1165 Type 2 diabetes mellitus with hyperglycemia: Secondary | ICD-10-CM

## 2023-12-09 MED ORDER — DEXCOM G7 SENSOR MISC: 1.0000 | 3 refills | Status: DC

## 2023-12-09 NOTE — Telephone Encounter (Signed)
 Requested Prescriptions   Signed Prescriptions Disp Refills   Continuous Glucose Sensor (DEXCOM G7 SENSOR) MISC 6 each 3    Sig: Inject 1 Device into the skin See admin instructions. Place 1 new sensor into the skin every 10 days    Authorizing Provider: Jorge Newcomer    Ordering User: Waneta Gut

## 2023-12-16 ENCOUNTER — Telehealth: Payer: Self-pay | Admitting: "Endocrinology

## 2023-12-16 NOTE — Telephone Encounter (Signed)
 Patient dropped off patient assistance paperwork.  This was placed in the providers in box at the front desk.  Company is [employer]

## 2023-12-16 NOTE — Telephone Encounter (Signed)
 Picked up paper work. In Dr review basket to sign.

## 2023-12-20 ENCOUNTER — Other Ambulatory Visit: Payer: Self-pay | Admitting: "Endocrinology

## 2023-12-20 MED ORDER — INSULIN ASPART (W/NIACINAMIDE) 100 UNIT/ML ~~LOC~~ SOPN: 18.0000 [IU] | PEN_INJECTOR | Freq: Three times a day (TID) | SUBCUTANEOUS | 3 refills | Status: AC

## 2023-12-20 NOTE — Progress Notes (Signed)
 NEUROLOGY FOLLOW UP OFFICE NOTE  Johnathon Mittal Stracener [id number]  Assessment/Plan:   Left sided Bell's palsy - largely resolved History of bilateral scattered small and punctate hemispheric infarcts, likely embolic in setting of endovascular procedure. Status post coiling of left terminus ICA aneurysm 2-3 mm right cavernous ICA aneurysm, stable Hypertension      1  Secondary stroke prevention as managed by PCP:             - ASA 81mg  daily             - Statin.  LDL goal less than 70             - Glycemic control.  Hgb A1c goal less than 7             - Normotensive blood pressure.  Follow up with PCP regarding elevated blood pressure. 2  Regarding aneurysm, will have her follow up again with interventional/endovascular radiology.  3  Follow up with me as needed.   Subjective:  REYANN TROOP is a 63 year old right-handed female with HTN, DM II, HLD, asthma, cerebral aneurysm and sleep apnea whom I previously saw for stroke presents for Bell's palsy.   MRI of brain and CTA head and neck personally reviewed.  UPDATE: On 07/25/2023, she noticed that she had trouble blinking her left eye.  She then developed left sided facial droop, difficulty closing her eye and slurred speech.  No unilateral numbness or weakness.  She went to the ED at [facility] as stroke code.  CT head showed no acute findings.  CTA of head and neck revealed stable 2 mm aneurysm  at the ophthalmic segment of right ICA and postsurgical changes from prior left ICA terminus aneurysm coiling but no LVO or high-grade stenosis.  Follow up MRI of brain revealed moderate chronic small vessel ischemic changes but no acute stroke.  She was ultimately diagnosed with Bell's palsy.  Patient is a brittle diabetic on insulin  with glucose in the 300s and elevated Hgb A1c, so it was decided risks of prednisone  outweighed the benefits.  It took about a month until she had significant improvement.  She still notes that the left  side of her face feels funny and sometimes liquid may dribble out of the left side of her mouth.  She may also notice occasional left sided facial twitching as well.  Denies altered taste or altered hearing.     HISTORY: Bilateral scattered small and punctate hemispheric infarcts, likely embolic in setting of endovascular procedure for cerebral aneurysm. After cataract surgery in November 2022, .she began having severe headaches.  No prior history of headache.  Right occipital pain that became diffuse.  She was found to have a 4 mm left terminus ICA aneurysm and 3 mm right cavernous ICA aneurysm.  She underwent coiling of the left ICA aneurysm on 07/20/2021.  Afterwards, she developed new left sided weakness and numbness followed by right sided weakness.  She received TNKase  with improvement of symptoms.  CT head showed no acute abnormality but MRI of brain revealed scattered small and punctate bilateral hemispheric ischemic infarcts without hemorrhage or mass effect.  CTA of head and neck showed coiling of the left ICA aneurysm and untreated 3 mm right ICA aneurysm but no LVO or significant stenosis.  TTE showed EF 65-70% with grade 1 diastolic dysfunction and no atrial level shunt.  LDL was 69 and Hgb A1c was 8.4.  She was on ASA 81mg   daily and Brilinta  90mg  BID prior to admission and discharged on same DAPT until 08/26/2021 when Brilinta  was to be discontinued.  Following discharge from inpatient rehab, she had repeat cerebral angiogram on 08/19/2021 which was stable without evidence of device migration or impingement on thep parent artery.  Plan for follow-up diagnostic cerebral angiogram in 6 months but she never scheduled a follow up.     Since the stroke, she reports ongoing fatigue.  Still has some weakness of right hand.  Sometimes she may get slight headaches.  They are still a right dull occipital pressure/slight throbbing headache.  They occur infrequently but it makes her nervous.      Imaging: 06/05/2021 CTA HEAD:  1. 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  2. 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  3. No intracranial large vessel occlusion or proximal high-grade arterial stenosis.  07/20/2021 CTA HEAD/NECK:  1. 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  2.  No intracranial large vessel occlusion or significant stenosis.  3.  No hemodynamically significant stenosis in the neck.  4. Unchanged 3 mm aneurysm projecting from the distal cavernous right ICA. 07/21/2021 MRI BRAIN WO:  1. 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 Among the most conspicuous is a 6 mm lacune in the left external capsule abutting the lentiform. No associated hemorrhage or mass effect.  2. Underlying moderate for age signal changes in the brain compatible with chronic small vessel disease. 07/27/2021 MRI BRAIN WO:  Punctate foci of restricted diffusion, consistent with acute and subacute infarcts, with new infarcts, compared to 07/21/2021, seen in the left occipital lobe, left corona radiata, and left body of the caudate. Given multiple vascular territories, an embolic etiology is suspected 07/28/2021 CTA HEAD/NECK:  1. Unchanged 3 mm aneurysm projecting from the distal cavernous right ICA. No new aneurysm is seen. 2.  No intracranial large vessel occlusion or significant stenosis. 3.  No hemodynamically significant stenosis in the neck.  4. Redemonstrated prior coiling of an aneurysm at the left ICA terminus.  PAST MEDICAL HISTORY: Past Medical History:  Diagnosis Date   Abnormal EKG    LVH with strain   Acid reflux    Asthma    Depression    Diabetes mellitus    A1C over 9   HLD (hyperlipidemia)    Hypertension    Noncompliance    Obesity    Pneumonia    Sleep apnea      MEDICATIONS: Current Outpatient Medications on File Prior to Visit  Medication Sig Dispense Refill   ACCU-CHEK GUIDE test strip USE AS INSTRUCTED TO CHECK BLOOD SUGAR TWICE DAILY. 100 strip 12   acetaminophen  (TYLENOL ) 500 MG tablet Take 1,000 mg by mouth every 6 (six) hours as needed for mild pain (pain score 1-3), moderate pain (pain score 4-6) or headache.     albuterol  (VENTOLIN  HFA) 108 (90 Base) MCG/ACT inhaler Inhale 1-2 puffs into the lungs every 6 (six) hours as needed for wheezing or shortness of breath. (Patient taking differently: Inhale 2 puffs into the lungs every 6 (six) hours as needed for shortness of breath or wheezing.) 1 each 0   amLODipine  (NORVASC ) 10 MG tablet Take 1 tablet by mouth once daily. (Patient taking differently: Take 10 mg by mouth every evening.) 30 tablet 1   aspirin  EC 81  MG EC tablet Take 1 tablet (81 mg total) by mouth daily. Swallow whole. 30 tablet 11   carvedilol  (COREG ) 25 MG tablet Take 25 mg by mouth. 1 tab in the morning, 1/2 tab at night     Continuous Blood Gluc Sensor (DEXCOM G7 SENSOR) MISC 1 DEVICE BY DOES NOT APPLY ROUTE AS DIRECTED. CHANGE SENSOR EVERY 10 DAYS 3 each 3   Continuous Glucose Sensor (DEXCOM G7 SENSOR) MISC Inject 1 Device into the skin See admin instructions. Place 1 new sensor into the skin every 10 days 6 each 3   empagliflozin  (JARDIANCE ) 10 MG TABS tablet Take 1 tablet (10 mg total) by mouth daily before breakfast. (Patient not taking: Reported on 08/30/2023) 90 tablet 0   gabapentin  (NEURONTIN ) 100 MG capsule Take 1 capsule (100 mg total) by mouth 3 (three) times daily. (Patient taking differently: Take 100-300 mg by mouth daily as needed (for neuropathic pain).) 90 capsule 3   hydrALAZINE (APRESOLINE) 25 MG tablet Take 25 mg by mouth daily.     insulin  aspart (FIASP ) 100 UNIT/ML FlexTouch Pen Inject 18 Units into the skin 3 (three) times daily before meals. 60 mL 3   insulin  glargine, 1 Unit Dial, (TOUJEO  SOLOSTAR) 300  UNIT/ML Solostar Pen Inject 24 Units into the skin daily. 30 mL 3   losartan  (COZAAR ) 100 MG tablet Take 1 tablet (100 mg total) by mouth daily. 30 tablet 0   metFORMIN  (GLUCOPHAGE -XR) 500 MG 24 hr tablet Take 1 tablet (500 mg total) by mouth 2 (two) times daily with a meal. 120 tablet 4   metoprolol  tartrate (LOPRESSOR ) 100 MG tablet Take 1 tablet by mouth two hours prior to scan 1 tablet 0   rosuvastatin  (CRESTOR ) 40 MG tablet Take 1 tablet (40 mg total) by mouth daily. (Patient taking differently: Take 40 mg by mouth every evening.) 30 tablet 0   tiZANidine (ZANAFLEX) 4 MG tablet Take 4 mg by mouth at bedtime as needed for muscle spasms.     No current facility-administered medications on file prior to visit.    ALLERGIES: Allergies  Allergen Reactions   Canagliflozin  Other (See Comments)    Dizziness, stabbing pain in right side of body   Hydrocodone  Hypertension    FAMILY HISTORY: Family History  Problem Relation Age of Onset   Cancer Mother    Hypertension Mother    Diabetes Mother    Breast cancer Maternal Aunt    Breast cancer Cousin       Objective:  Blood pressure (!) 140/90, pulse 84, height 5' 6 (1.676 m), weight 263 lb (119.3 kg), last menstrual period 08/02/2013, SpO2 99%. General: No acute distress.  Patient appears well-groomed.   Head:  Normocephalic/atraumatic Eyes:  Fundi examined but not visualized Neck: supple, no paraspinal tenderness, full range of motion Heart:  Regular rate and rhythm Neurological Exam: alert and oriented to person, place, and time.  Speech fluent and not dysarthric, language intact.  Slight left upper and lower facial weakness.  Reports slight altered sensation to left V2-V3 to touch.  Otherwise, CN II-XII intact. Bulk and tone normal, muscle strength 5/5 throughout.  Pinprick sensation slightly reduced in feet. Vibratory sensation reduced in feet. Deep tendon reflexes 2+ throughout, toes downgoing.  Finger to nose testing intact.   Gait steady.  Romberg with sway.     Juliene Dunnings, DO  CC:  Valery Ripple, MD

## 2023-12-22 ENCOUNTER — Ambulatory Visit: Payer: BLUE CROSS/BLUE SHIELD | Admitting: Neurology

## 2023-12-22 ENCOUNTER — Encounter: Payer: Self-pay | Admitting: Neurology

## 2023-12-22 VITALS — BP 140/90 | HR 84 | Ht 66.0 in | Wt 263.0 lb

## 2023-12-22 DIAGNOSIS — I671 Cerebral aneurysm, nonruptured: Secondary | ICD-10-CM

## 2023-12-22 DIAGNOSIS — I6340 Cerebral infarction due to embolism of unspecified cerebral artery: Secondary | ICD-10-CM

## 2023-12-22 DIAGNOSIS — E785 Hyperlipidemia, unspecified: Secondary | ICD-10-CM | POA: Diagnosis not present

## 2023-12-22 DIAGNOSIS — I10 Essential (primary) hypertension: Secondary | ICD-10-CM

## 2023-12-22 DIAGNOSIS — E119 Type 2 diabetes mellitus without complications: Secondary | ICD-10-CM | POA: Diagnosis not present

## 2023-12-22 DIAGNOSIS — G510 Bell's palsy: Secondary | ICD-10-CM | POA: Diagnosis not present

## 2023-12-22 DIAGNOSIS — Z8673 Personal history of transient ischemic attack (TIA), and cerebral infarction without residual deficits: Secondary | ICD-10-CM

## 2023-12-22 NOTE — Patient Instructions (Addendum)
 Check CTA of head. We have sent a referral to [facility] for your MRI and they will call you directly to schedule your appointment. They are located at [address] [facility] If you need to contact them directly please call [phone].  Following results, I will send you back to endovascular radiology for further recommendations Otherwise, follow up as needed   Addendum:  CTA just done, so will cancel.  Refer back to endovascular radiology

## 2023-12-23 ENCOUNTER — Encounter: Payer: Self-pay | Admitting: Neurology

## 2023-12-28 ENCOUNTER — Ambulatory Visit
Admission: RE | Admit: 2023-12-28 | Discharge: 2023-12-28 | Discharge status: Home / Self Care | Source: Ambulatory Visit | Attending: Neurology

## 2023-12-28 DIAGNOSIS — I671 Cerebral aneurysm, nonruptured: Secondary | ICD-10-CM

## 2023-12-28 MED ORDER — IOPAMIDOL (ISOVUE-370) INJECTION 76%
75.0000 mL | Freq: Once | INTRAVENOUS | Status: AC | PRN
  Administered 2023-12-28: 75 mL via INTRAVENOUS

## 2023-12-29 ENCOUNTER — Ambulatory Visit: Payer: Self-pay | Admitting: Neurology

## 2023-12-30 NOTE — Progress Notes (Signed)
 Patient of results. Interventional Radiology number given to patient to call and follow up.

## 2024-01-16 ENCOUNTER — Telehealth: Payer: Self-pay

## 2024-01-16 ENCOUNTER — Other Ambulatory Visit (HOSPITAL_COMMUNITY): Payer: Self-pay

## 2024-01-16 NOTE — Telephone Encounter (Signed)
 Pharmacy Patient Advocate Encounter   Received notification from CoverMyMeds that prior authorization for Dexcom G7 sensor is required/requested.   Insurance verification completed.   The patient is insured through [facility] and [employer] ([facility]).   There is still an active PA on file through [facility]. [employer] is saying they prefer Freestyle libre 3 plus. I dont see either on her med list anymore and it looks like she hasn't picked up Dexcom since 11/03/23 for a 30 day supply. Please advise if a PA is needed and if so, which CGM

## 2024-01-19 ENCOUNTER — Ambulatory Visit (INDEPENDENT_AMBULATORY_CARE_PROVIDER_SITE_OTHER): Admitting: "Endocrinology

## 2024-01-19 ENCOUNTER — Encounter: Payer: Self-pay | Admitting: "Endocrinology

## 2024-01-19 VITALS — BP 140/86 | HR 77 | Ht 66.0 in | Wt 266.0 lb

## 2024-01-19 DIAGNOSIS — Z7984 Long term (current) use of oral hypoglycemic drugs: Secondary | ICD-10-CM | POA: Diagnosis not present

## 2024-01-19 DIAGNOSIS — E1165 Type 2 diabetes mellitus with hyperglycemia: Secondary | ICD-10-CM

## 2024-01-19 DIAGNOSIS — Z794 Long term (current) use of insulin: Secondary | ICD-10-CM | POA: Diagnosis not present

## 2024-01-19 DIAGNOSIS — E7800 Pure hypercholesterolemia, unspecified: Secondary | ICD-10-CM | POA: Diagnosis not present

## 2024-01-19 LAB — POCT GLYCOSYLATED HEMOGLOBIN (HGB A1C): Hemoglobin A1C: 8.8 % — AB (ref 4.0–5.6)

## 2024-01-19 MED ORDER — FREESTYLE LIBRE 3 PLUS SENSOR MISC
1.0000 | 3 refills | Status: AC
Start: 2024-01-19 — End: ?

## 2024-01-19 NOTE — Telephone Encounter (Signed)
 Pt has appt today, will inform her during appt.

## 2024-01-19 NOTE — Progress Notes (Signed)
 Outpatient Endocrinology Note Obadiah Birmingham, MD  01/19/24   Shannon Oliver 1960-09-05 [id number]  Referring Provider: No ref. provider found Primary Care Provider: Elliot Charm, MD Reason for consultation: Subjective   Assessment & Plan  Diagnoses and all orders for this visit:  Uncontrolled type 2 diabetes mellitus with hyperglycemia, with long-term current use of insulin  (HCC) -     POCT glycosylated hemoglobin (Hb A1C)  Long-term insulin  use (HCC)  Long term (current) use of oral hypoglycemic drugs  Pure hypercholesterolemia  Other orders -     Continuous Glucose Sensor (FREESTYLE LIBRE 3 PLUS SENSOR) MISC; Inject 1 Device into the skin continuous. Change every 15 days   Diabetes Type II complicated by neuropathy Lab Results  Component Value Date   GFR 64.41 03/31/2023   Hba1c goal less than 7, current Hba1c is  Lab Results  Component Value Date   HGBA1C 8.8 (A) 01/19/2024   Will recommend the following: On Toujeo  22 units qam  Fiasp  20 units three times a day before each meal (put alarms to remind 15 min before meals) Metformin  XR 500mg  twice a day (max preferred dose)  Gabapentin  for neuropathy Ordered libre 3 +, dexcom is not covered  No known contraindications/side effects to any of above medications  -Last LD and Tg are as follows: Lab Results  Component Value Date   LDLCALC 83 04/29/2023    Lab Results  Component Value Date   TRIG 113.0 04/29/2023   -rosuvastatin  40 mg QD -Follow low fat diet and exercise   -Blood pressure goal <140/90 - Microalbumin/creatinine goal is < 30 -Last MA/Cr is as follows: No results found for: MICROALBUR, MALB24HUR  - on ACE/ARB losartan  100 mg qd -diet changes including salt restriction -limit eating outside -counseled BP targets per standards of diabetes care -uncontrolled blood pressure can lead to retinopathy, nephropathy and cardiovascular and atherosclerotic heart disease  Reviewed  and counseled on: -A1C target -Blood sugar targets -Complications of uncontrolled diabetes  -Checking blood sugar before meals and bedtime and bring log next visit -All medications with mechanism of action and side effects -Hypoglycemia management: rule of 15's, Glucagon Emergency Kit and medical alert ID -low-carb low-fat plate-method diet -At least 20 minutes of physical activity per day -Annual dilated retinal eye exam and foot exam -compliance and follow up needs -follow up as scheduled or earlier if problem gets worse  Call if blood sugar is less than 70 or consistently above 250    Take a 15 gm snack of carbohydrate at bedtime before you go to sleep if your blood sugar is less than 100.    If you are going to fast after midnight for a test or procedure, ask your physician for instructions on how to reduce/decrease your insulin  dose.    Call if blood sugar is less than 70 or consistently above 250  -Treating a low sugar by rule of 15  (15 gms of sugar every 15 min until sugar is more than 70) If you feel your sugar is low, test your sugar to be sure If your sugar is low (less than 70), then take 15 grams of a fast acting Carbohydrate (3-4 glucose tablets or glucose gel or 4 ounces of juice or regular soda) Recheck your sugar 15 min after treating low to make sure it is more than 70 If sugar is still less than 70, treat again with 15 grams of carbohydrate          Don't drive  the hour of hypoglycemia  If unconscious/unable to eat or drink by mouth, use glucagon injection or nasal spray baqsimi and call 911. Can repeat again in 15 min if still unconscious.  No follow-ups on file.   I have reviewed current medications, nurse's notes, allergies, vital signs, past medical and surgical history, family medical history, and social history for this encounter. Counseled patient on symptoms, examination findings, lab findings, imaging results, treatment decisions and monitoring and  prognosis. The patient understood the recommendations and agrees with the treatment plan. All questions regarding treatment plan were fully answered.  Obadiah Birmingham, MD  01/19/24    History of Present Illness Shannon Oliver is a 63 y.o. year old female who presents for evaluation of Type II diabetes mellitus.  Shannon Oliver was first diagnosed in 2008.   Diabetes education +  Home diabetes regimen: On Toujeo  22 units qam  Fiasp  16 units before meals at least twice a day  Metformin  XR 500mg -self cut it to current dose   Stopped Insulin  70/30 or fiasp  10-12 units bidac Couldn't get fiasp  8 units tidac/ farxiga /jardiance  10 mg every day  Background history:   She thinks she has been on mostly oral hypoglycemic drugs notably metformin  and Amaryl for most of the duration of her diabetes She was probably started on insulin  in 2018 with Levemir insulin  when her A1c was 9.6 She has not had an endocrinology follow-up since 03/2017  COMPLICATIONS +  MI/Stroke +  retinopathy +  neuropathy -  nephropathy  BLOOD SUGAR DATA BG 180-370  Physical Exam  BP (!) 140/86   Pulse 77   Ht 5' 6 (1.676 m)   Wt 266 lb (120.7 kg)   LMP 08/02/2013 Comment: spotting  SpO2 96%   BMI 42.93 kg/m    Constitutional: well developed, well nourished Head: normocephalic, atraumatic Eyes: sclera anicteric, no redness Neck: supple Lungs: normal respiratory effort Neurology: alert and oriented Skin: dry, no appreciable rashes Musculoskeletal: no appreciable defects Psychiatric: normal mood and affect Diabetic Foot Exam - Simple   No data filed      Current Medications Patient's Medications  New Prescriptions   CONTINUOUS GLUCOSE SENSOR (FREESTYLE LIBRE 3 PLUS SENSOR) MISC    Inject 1 Device into the skin continuous. Change every 15 days  Previous Medications   ACCU-CHEK GUIDE TEST STRIP    USE AS INSTRUCTED TO CHECK BLOOD SUGAR TWICE DAILY.   ACETAMINOPHEN  (TYLENOL ) 500 MG TABLET     Take 1,000 mg by mouth every 6 (six) hours as needed for mild pain (pain score 1-3), moderate pain (pain score 4-6) or headache.   ALBUTEROL  (VENTOLIN  HFA) 108 (90 BASE) MCG/ACT INHALER    Inhale 1-2 puffs into the lungs every 6 (six) hours as needed for wheezing or shortness of breath.   AMLODIPINE  (NORVASC ) 10 MG TABLET    Take 1 tablet by mouth once daily.   ASPIRIN  EC 81 MG EC TABLET    Take 1 tablet (81 mg total) by mouth daily. Swallow whole.   CARVEDILOL  (COREG ) 25 MG TABLET    Take 25 mg by mouth. 1 tab in the morning, 1/2 tab at night   EMPAGLIFLOZIN  (JARDIANCE ) 10 MG TABS TABLET    Take 1 tablet (10 mg total) by mouth daily before breakfast.   GABAPENTIN  (NEURONTIN ) 100 MG CAPSULE    Take 1 capsule (100 mg total) by mouth 3 (three) times daily.   HYDRALAZINE (APRESOLINE) 25 MG TABLET    Take 25 mg  by mouth daily.   INSULIN  ASPART (FIASP ) 100 UNIT/ML FLEXTOUCH PEN    Inject 18 Units into the skin 3 (three) times daily before meals.   LOSARTAN  (COZAAR ) 100 MG TABLET    Take 1 tablet (100 mg total) by mouth daily.   METFORMIN  (GLUCOPHAGE -XR) 500 MG 24 HR TABLET    Take 1 tablet (500 mg total) by mouth 2 (two) times daily with a meal.   ROSUVASTATIN  (CRESTOR ) 40 MG TABLET    Take 1 tablet (40 mg total) by mouth daily.   TIZANIDINE (ZANAFLEX) 4 MG TABLET    Take 4 mg by mouth at bedtime as needed for muscle spasms.  Modified Medications   No medications on file  Discontinued Medications   No medications on file    Allergies Allergies  Allergen Reactions   Canagliflozin  Other (See Comments)    Dizziness, stabbing pain in right side of body   Hydrocodone  Hypertension    Past Medical History Past Medical History:  Diagnosis Date   Abnormal EKG    LVH with strain   Acid reflux    Asthma    Depression    Diabetes mellitus    A1C over 9   HLD (hyperlipidemia)    Hypertension    Noncompliance    Obesity    Pneumonia    Sleep apnea     Past Surgical History Past Surgical  History:  Procedure Laterality Date   CARDIAC CATHETERIZATION     CATARACT EXTRACTION Left 06/04/2021   COLONOSCOPY WITH PROPOFOL  N/A 04/29/2015   Procedure: COLONOSCOPY WITH PROPOFOL ;  Surgeon: Gladis MARLA Louder, MD;  Location: [facility];  Service: Endoscopy;  Laterality: N/A;   EYE SURGERY     HYSTEROSCOPY WITH D & C N/A 09/07/2017   Procedure: DILATATION AND CURETTAGE /HYSTEROSCOPY;  Surgeon: Nicholaus Burnard HERO, MD;  Location: [facility];  Service: Gynecology;  Laterality: N/A;   IR ANGIO INTRA EXTRACRAN SEL COM CAROTID INNOMINATE UNI R MOD SED  06/18/2021   IR ANGIO INTRA EXTRACRAN SEL INTERNAL CAROTID BILAT MOD SED  08/19/2021   IR ANGIO INTRA EXTRACRAN SEL INTERNAL CAROTID UNI L MOD SED  06/18/2021   IR ANGIO INTRA EXTRACRAN SEL INTERNAL CAROTID UNI R MOD SED  07/20/2021   IR ANGIO VERTEBRAL SEL VERTEBRAL UNI R MOD SED  06/18/2021   IR ANGIO VERTEBRAL SEL VERTEBRAL UNI R MOD SED  08/19/2021   IR ANGIOGRAM FOLLOW UP STUDY  07/20/2021   IR CT HEAD LTD  07/20/2021   IR RADIOLOGIST EVAL & MGMT  06/19/2021   IR TRANSCATH/EMBOLIZ  07/20/2021   IR US  GUIDE VASC ACCESS RIGHT  06/18/2021   IR US  GUIDE VASC ACCESS RIGHT  08/19/2021   KNEE ARTHROSCOPY W/ MENISCAL REPAIR Right    RADIOLOGY WITH ANESTHESIA N/A 07/20/2021   Procedure: IR WITH ANESTHESIA;  Surgeon: de Macedo Rodrigues, Katyucia, MD;  Location: [facility];  Service: Radiology;  Laterality: N/A;    Family History family history includes Breast cancer in her cousin and maternal aunt; Cancer in her mother; Diabetes in her mother; Hypertension in her mother.  Social History Social History   Socioeconomic History   Marital status: Married    Spouse name: Not on file   Number of children: Not on file   Years of education: Not on file   Highest education level: Not on file  Occupational History   Not on file  Tobacco Use   Smoking status: Never   Smokeless tobacco: Never  Vaping Use   Vaping status: Never Used  Substance  and Sexual Activity   Alcohol use: Not Currently    Comment: occ   Drug use: No   Sexual activity: Not on file  Other Topics Concern   Not on file  Social History Narrative   ** Merged History Encounter **       Social Drivers of Health   Financial Resource Strain: Not on file  Food Insecurity: Not on file  Transportation Needs: Not on file  Physical Activity: Not on file  Stress: Not on file  Social Connections: Unknown (11/15/2021)   Received from [employer]   Social Network    Social Network: Not on file  Intimate Partner Violence: Unknown (10/07/2021)   Received from [facility]   HITS    Physically Hurt: Not on file    Insult or Talk Down To: Not on file    Threaten Physical Harm: Not on file    Scream or Curse: Not on file    Lab Results  Component Value Date   HGBA1C 8.8 (A) 01/19/2024   HGBA1C 8.7 (A) 07/28/2023   HGBA1C 11.1 (H) 03/31/2023   Lab Results  Component Value Date   CHOL 159 04/29/2023   Lab Results  Component Value Date   HDL 53.60 04/29/2023   Lab Results  Component Value Date   LDLCALC 83 04/29/2023   Lab Results  Component Value Date   TRIG 113.0 04/29/2023   Lab Results  Component Value Date   CHOLHDL 3 04/29/2023   Lab Results  Component Value Date   CREATININE 1.05 (H) 08/30/2023   Lab Results  Component Value Date   GFR 64.41 03/31/2023   No results found for: MACKEY CURRENT     Component Value Date/Time   NA 142 08/30/2023 1537   K 4.3 08/30/2023 1537   CL 104 08/30/2023 1537   CO2 25 08/30/2023 1537   GLUCOSE 281 (H) 08/30/2023 1537   GLUCOSE 217 (H) 07/25/2023 1826   BUN 19 08/30/2023 1537   CREATININE 1.05 (H) 08/30/2023 1537   CREATININE 1.00 10/16/2013 1759   CALCIUM  9.4 08/30/2023 1537   PROT 7.4 07/25/2023 1824   ALBUMIN 3.8 07/25/2023 1824   AST 33 07/25/2023 1824   ALT 23 07/25/2023 1824   ALKPHOS 68 07/25/2023 1824   BILITOT 0.9 07/25/2023 1824   GFRNONAA 57 (L) 07/25/2023 1824    GFRAA >60 08/09/2018 1017      Latest Ref Rng & Units 08/30/2023    3:37 PM 07/25/2023    6:26 PM 07/25/2023    6:24 PM  BMP  Glucose 70 - 99 mg/dL 718  782  786   BUN 8 - 27 mg/dL 19  20  14    Creatinine 0.57 - 1.00 mg/dL 8.94  8.89  8.89   BUN/Creat Ratio 12 - 28 18     Sodium 134 - 144 mmol/L 142  141  137   Potassium 3.5 - 5.2 mmol/L 4.3  4.2  4.3   Chloride 96 - 106 mmol/L 104  105  105   CO2 20 - 29 mmol/L 25   22   Calcium  8.7 - 10.3 mg/dL 9.4   8.8        Component Value Date/Time   WBC 5.2 07/25/2023 1824   RBC 4.08 07/25/2023 1824   HGB 12.2 07/25/2023 1826   HGB 12.0 05/30/2017 1858   HCT 36.0 07/25/2023 1826   HCT 36.3 05/30/2017 1858  PLT 228 07/25/2023 1824   PLT 248 05/30/2017 1858   MCV 89.7 07/25/2023 1824   MCV 90 05/30/2017 1858   MCH 29.4 07/25/2023 1824   MCHC 32.8 07/25/2023 1824   RDW 13.8 07/25/2023 1824   RDW 13.7 05/30/2017 1858   LYMPHSABS 2.0 07/25/2023 1824   LYMPHSABS 3.2 (H) 05/30/2017 1858   MONOABS 0.6 07/25/2023 1824   EOSABS 0.2 07/25/2023 1824   EOSABS 0.2 05/30/2017 1858   BASOSABS 0.0 07/25/2023 1824   BASOSABS 0.0 05/30/2017 1858     Parts of this note may have been dictated using voice recognition software. There may be variances in spelling and vocabulary which are unintentional. Not all errors are proofread. Please notify the dino if any discrepancies are noted or if the meaning of any statement is not clear.

## 2024-01-23 ENCOUNTER — Telehealth (HOSPITAL_COMMUNITY): Payer: Self-pay

## 2024-01-23 NOTE — Telephone Encounter (Signed)
 Returned pt's call to let her know that Dr. Jammie last day is 8/2. She will either need reach back out to Dr. Skeet about a different referral and I also gave her Dr. Janjua and Dr. Lester. AB

## 2024-02-09 ENCOUNTER — Telehealth: Payer: Self-pay | Admitting: Neurology

## 2024-02-09 DIAGNOSIS — G510 Bell's palsy: Secondary | ICD-10-CM | POA: Diagnosis not present

## 2024-02-09 DIAGNOSIS — I671 Cerebral aneurysm, nonruptured: Secondary | ICD-10-CM

## 2024-02-09 DIAGNOSIS — H9202 Otalgia, left ear: Secondary | ICD-10-CM | POA: Diagnosis not present

## 2024-02-09 NOTE — Telephone Encounter (Signed)
 Pt. Experiencing issues in face still no change, needs to discuss CT scan and other orders put in with [facility] as one [facility] doctor has retired, please call back

## 2024-02-10 MED ORDER — BACLOFEN 5 MG PO TABS: 5.0000 mg | ORAL_TABLET | Freq: Three times a day (TID) | ORAL | 5 refills | Status: DC

## 2024-02-10 NOTE — Telephone Encounter (Signed)
 Per Dr.Jaffe, Bell's palsy doesn't flare up. If she is having new facial weakness then she needs to go to the ED. If she is talking about discomfort or face feeling tight, there may be certain physical therapists that perform treatments but there aren't any medications that are too effective. She is supposed to follow up with interventional radiology regarding the aneurysm . If they can't provide a referral then we can refer to neurosurgery.    Bells Palsy doesn't typically cause new facial weakness. If that is what's happening, she needs to go to the ED. If she is having just facial spasms, she can make an appointment to discuss. Certain therapies may help.    Patient mychart not active right now tried calling patient as well no answer.    LMOVM to call the office back.

## 2024-02-10 NOTE — Telephone Encounter (Signed)
 Patient returned call, Per Patient she only got a message stating that the Provider was retiring but not if they were doing the referring out.    Per patient she thinks what she is having is spasms. Please advise what to do?

## 2024-02-10 NOTE — Telephone Encounter (Signed)
 Per Dr.jaffe, We can refer her to Dr. Lester, the new interventional radiologist   For spasms, we can prescribe baclofen  5mg  three times daily as needed (qty 90, refill 5), but she will need to make a follow up appointment.   LMOVM for patient.

## 2024-02-13 ENCOUNTER — Telehealth: Payer: Self-pay | Admitting: Neurology

## 2024-02-13 DIAGNOSIS — Z794 Long term (current) use of insulin: Secondary | ICD-10-CM | POA: Insufficient documentation

## 2024-02-13 DIAGNOSIS — F332 Major depressive disorder, recurrent severe without psychotic features: Secondary | ICD-10-CM | POA: Insufficient documentation

## 2024-02-13 DIAGNOSIS — E1165 Type 2 diabetes mellitus with hyperglycemia: Secondary | ICD-10-CM | POA: Insufficient documentation

## 2024-02-13 DIAGNOSIS — R400 Somnolence: Secondary | ICD-10-CM | POA: Insufficient documentation

## 2024-02-13 DIAGNOSIS — J322 Chronic ethmoidal sinusitis: Secondary | ICD-10-CM | POA: Insufficient documentation

## 2024-02-13 DIAGNOSIS — H9311 Tinnitus, right ear: Secondary | ICD-10-CM | POA: Insufficient documentation

## 2024-02-13 DIAGNOSIS — E785 Hyperlipidemia, unspecified: Secondary | ICD-10-CM | POA: Insufficient documentation

## 2024-02-13 DIAGNOSIS — N8500 Endometrial hyperplasia, unspecified: Secondary | ICD-10-CM | POA: Insufficient documentation

## 2024-02-13 DIAGNOSIS — J45909 Unspecified asthma, uncomplicated: Secondary | ICD-10-CM | POA: Insufficient documentation

## 2024-02-13 DIAGNOSIS — G44209 Tension-type headache, unspecified, not intractable: Secondary | ICD-10-CM | POA: Insufficient documentation

## 2024-02-13 DIAGNOSIS — K1120 Sialoadenitis, unspecified: Secondary | ICD-10-CM | POA: Insufficient documentation

## 2024-02-13 DIAGNOSIS — N939 Abnormal uterine and vaginal bleeding, unspecified: Secondary | ICD-10-CM | POA: Insufficient documentation

## 2024-02-13 DIAGNOSIS — G4733 Obstructive sleep apnea (adult) (pediatric): Secondary | ICD-10-CM | POA: Insufficient documentation

## 2024-02-13 DIAGNOSIS — F3341 Major depressive disorder, recurrent, in partial remission: Secondary | ICD-10-CM | POA: Insufficient documentation

## 2024-02-13 DIAGNOSIS — M1711 Unilateral primary osteoarthritis, right knee: Secondary | ICD-10-CM | POA: Insufficient documentation

## 2024-02-13 DIAGNOSIS — N921 Excessive and frequent menstruation with irregular cycle: Secondary | ICD-10-CM | POA: Insufficient documentation

## 2024-02-13 DIAGNOSIS — H18513 Endothelial corneal dystrophy, bilateral: Secondary | ICD-10-CM | POA: Insufficient documentation

## 2024-02-13 DIAGNOSIS — F419 Anxiety disorder, unspecified: Secondary | ICD-10-CM | POA: Insufficient documentation

## 2024-02-13 DIAGNOSIS — D508 Other iron deficiency anemias: Secondary | ICD-10-CM | POA: Insufficient documentation

## 2024-02-13 NOTE — Telephone Encounter (Signed)
 Pt. Cld Shannon Oliver bk

## 2024-02-13 NOTE — Telephone Encounter (Signed)
 Patient advised of last week note and the Baclofen  was called in.    Patient wanted to know if she could get a referral for PT.

## 2024-02-14 NOTE — Telephone Encounter (Signed)
 Patient advised per Dr.Jaffe note, Let's see if the baclofen  helps first.

## 2024-02-15 ENCOUNTER — Encounter: Payer: Self-pay | Admitting: Neuroradiology

## 2024-02-15 ENCOUNTER — Ambulatory Visit (INDEPENDENT_AMBULATORY_CARE_PROVIDER_SITE_OTHER): Admitting: Neuroradiology

## 2024-02-15 VITALS — BP 164/91 | HR 84 | Ht 66.0 in | Wt 267.0 lb

## 2024-02-15 DIAGNOSIS — I671 Cerebral aneurysm, nonruptured: Secondary | ICD-10-CM | POA: Diagnosis not present

## 2024-02-15 NOTE — Progress Notes (Signed)
 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 I also reviewed the previous angiogram.    She has a 2 mm paraopthalmic aneurysm which is stable.    I explained that her chance of recurrence at this time is very low.  Chance of SAH from a 2 mm paraopthalmic aneurysm with 2 years of stability also very low.  No further imaging follow up needed.

## 2024-02-24 DIAGNOSIS — M79659 Pain in unspecified thigh: Secondary | ICD-10-CM | POA: Diagnosis not present

## 2024-02-24 DIAGNOSIS — E1165 Type 2 diabetes mellitus with hyperglycemia: Secondary | ICD-10-CM | POA: Diagnosis not present

## 2024-02-24 DIAGNOSIS — E1149 Type 2 diabetes mellitus with other diabetic neurological complication: Secondary | ICD-10-CM | POA: Diagnosis not present

## 2024-03-14 ENCOUNTER — Other Ambulatory Visit: Payer: Self-pay | Admitting: "Endocrinology

## 2024-03-14 DIAGNOSIS — E1165 Type 2 diabetes mellitus with hyperglycemia: Secondary | ICD-10-CM

## 2024-04-23 ENCOUNTER — Ambulatory Visit: Admitting: "Endocrinology

## 2024-04-24 ENCOUNTER — Encounter: Payer: Self-pay | Admitting: Neurology

## 2024-04-24 ENCOUNTER — Other Ambulatory Visit

## 2024-04-24 ENCOUNTER — Ambulatory Visit (INDEPENDENT_AMBULATORY_CARE_PROVIDER_SITE_OTHER): Admitting: Neurology

## 2024-04-24 VITALS — BP 172/79 | HR 98 | Ht 66.0 in | Wt 259.0 lb

## 2024-04-24 DIAGNOSIS — G510 Bell's palsy: Secondary | ICD-10-CM | POA: Diagnosis not present

## 2024-04-24 DIAGNOSIS — I671 Cerebral aneurysm, nonruptured: Secondary | ICD-10-CM

## 2024-04-24 DIAGNOSIS — I10 Essential (primary) hypertension: Secondary | ICD-10-CM

## 2024-04-24 MED ORDER — BACLOFEN 10 MG PO TABS: 10.0000 mg | ORAL_TABLET | Freq: Three times a day (TID) | ORAL | 5 refills | Status: AC

## 2024-04-24 NOTE — Progress Notes (Addendum)
 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 History of post coiling of left terminus ICA aneurysm Hypertension      1  Increase baclofen  to 10mg  three times daily 2  Secondary stroke prevention as managed by PCP:             - ASA 81mg  daily             - Statin.  LDL goal less than 70             - Glycemic control.  Hgb A1c goal less than 7             - Normotensive blood pressure.  Follow up with PCP regarding elevated blood pressure. 2  Extend follow up of cerebral aneurysm monitoring to 3 to 5 years. 3  Follow up 7 months.   Subjective:  Shannon Oliver is a 63 year old right-handed female with HTN, DM II, HLD, asthma, cerebral aneurysm and sleep apnea and history of bilateral embolic strokes in setting of endovascular procedure for cerebral aneurysm follows up for left sided bell's palsy.  CTA head personally reviewed.  UPDATE: Cerebral aneurysm: Follow up CTA of head on 12/28/2023 revealed stable aneurysm.  Left-sided Bell's palsy: She continues to have discomfort on the left side of her face.  When she starts to move her mouth, such as while talking or eating, her left eye with spasm and shut.  When she eats, her left eye will water as well as her mouth.  No new or worsening facial weakness.  Started Baclofen  5mg . Helps somewhat but only takes it twice daily as needed, mostly when she has to eat.   HISTORY: Left sided Bell's palsy: On 07/25/2023, she noticed that she had trouble blinking her left eye.  She then developed left sided facial droop, difficulty closing her eye and slurred speech.  No unilateral numbness or weakness.  She went to the ED at [facility] as stroke code.  CT  head showed no acute findings.  CTA of head and neck revealed stable 2 mm aneurysm  at the ophthalmic segment of right ICA and postsurgical changes from prior left ICA terminus aneurysm coiling but no LVO or high-grade stenosis.  Follow up MRI of brain revealed moderate chronic small vessel ischemic changes but no acute stroke.  She was ultimately diagnosed with Bell's palsy.  Patient is a brittle diabetic on insulin  with glucose in the 300s and elevated Hgb A1c, so it was decided risks of prednisone  outweighed the benefits.  It took about a month until she had significant improvement.  She still notes that the left side of her face feels funny and sometimes liquid may dribble out of the left side of her mouth.  She may also notice occasional left sided facial twitching as well.  Denies altered taste or altered hearing.    Bilateral scattered small and punctate hemispheric infarcts, likely embolic in setting of endovascular procedure for cerebral aneurysm. After cataract surgery in November 2022, .she began having severe headaches.  No prior history of headache.  Right occipital pain that became diffuse.  She was found to have a 4 mm left terminus ICA aneurysm and 3 mm right cavernous ICA aneurysm.  She underwent coiling of  the left ICA aneurysm on 07/20/2021.  Afterwards, she developed new left sided weakness and numbness followed by right sided weakness.  She received TNKase  with improvement of symptoms.  CT head showed no acute abnormality but MRI of brain revealed scattered small and punctate bilateral hemispheric ischemic infarcts without hemorrhage or mass effect.  CTA of head and neck showed coiling of the left ICA aneurysm and untreated 3 mm right ICA aneurysm but no LVO or significant stenosis.  TTE showed EF 65-70% with grade 1 diastolic dysfunction and no atrial level shunt.  LDL was 69 and Hgb A1c was 8.4.  She was on ASA 81mg  daily and Brilinta  90mg  BID prior to admission and discharged on same DAPT  until 08/26/2021 when Brilinta  was to be discontinued.  Following discharge from inpatient rehab, she had repeat cerebral angiogram on 08/19/2021 which was stable without evidence of device migration or impingement on thep parent artery.  Plan for follow-up diagnostic cerebral angiogram in 6 months but she never scheduled a follow up.     Since the stroke, she reports ongoing fatigue.  Still has some weakness of right hand.  Sometimes she may get slight headaches.  They are still a right dull occipital pressure/slight throbbing headache.  They occur infrequently but it makes her nervous.     Imaging: 06/05/2021 CTA HEAD:  1. 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  2. 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  3. No intracranial large vessel occlusion or proximal high-grade arterial stenosis.  07/20/2021 CTA HEAD/NECK:  1. 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  2.  No intracranial large vessel occlusion or significant stenosis.  3.  No hemodynamically significant stenosis in the neck.  4. Unchanged 3 mm aneurysm projecting from the distal cavernous right ICA. 07/21/2021 MRI BRAIN WO:  1. 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 Among the most conspicuous is a 6 mm lacune in the left external capsule abutting the lentiform. No associated hemorrhage or mass effect.  2. Underlying moderate for age signal changes in the brain compatible with chronic small vessel disease. 07/27/2021 MRI BRAIN WO:  Punctate foci of restricted diffusion, consistent with acute and subacute infarcts, with new infarcts, compared to 07/21/2021, seen in the left occipital lobe, left corona radiata, and left body of the caudate. Given multiple vascular territories, an embolic etiology is suspected 07/28/2021 CTA HEAD/NECK:  1.  Unchanged 3 mm aneurysm projecting from the distal cavernous right ICA. No new aneurysm is seen. 2.  No intracranial large vessel occlusion or significant stenosis. 3.  No hemodynamically significant stenosis in the neck.  4. Redemonstrated prior coiling of an aneurysm at the left ICA terminus. 12/28/2023 CTA HEAD:  1. Stable left ICA terminus aneurysm coiling without evidence for residual or recurrent aneurysm. 2. Stable 2 mm medially directed cavernous right ICA aneurysm. 3. Stable remote infarct of the posterior right cerebellum. 4. Stable periventricular and subcortical white matter hypoattenuation bilaterally is moderately advanced for age. This likely reflects the sequela of chronic microvascular ischemia.  PAST MEDICAL HISTORY: Past Medical History:  Diagnosis Date   Abnormal EKG    LVH with strain   Acid reflux    Asthma    Depression    Diabetes mellitus    A1C over 9   HLD (hyperlipidemia)    Hypertension  Noncompliance    Obesity    Pneumonia    Sleep apnea     MEDICATIONS: Current Outpatient Medications on File Prior to Visit  Medication Sig Dispense Refill   ACCU-CHEK GUIDE test strip USE AS INSTRUCTED TO CHECK BLOOD SUGAR TWICE DAILY. 100 strip 12   acetaminophen  (TYLENOL ) 500 MG tablet Take 1,000 mg by mouth every 6 (six) hours as needed for mild pain (pain score 1-3), moderate pain (pain score 4-6) or headache.     albuterol  (VENTOLIN  HFA) 108 (90 Base) MCG/ACT inhaler Inhale 1-2 puffs into the lungs every 6 (six) hours as needed for wheezing or shortness of breath. 1 each 0   amLODipine  (NORVASC ) 10 MG tablet Take 1 tablet by mouth once daily. 30 tablet 1   aspirin  EC 81 MG EC tablet Take 1 tablet (81 mg total) by mouth daily. Swallow whole. 30 tablet 11   Baclofen  5 MG TABS Take 1 tablet (5 mg total) by mouth 3 (three) times daily. 90 tablet 5   carvedilol  (COREG ) 25 MG tablet Take 25 mg by mouth. 1 tab in the morning, 1/2 tab at night     Continuous Glucose Sensor  (FREESTYLE LIBRE 3 PLUS SENSOR) MISC Inject 1 Device into the skin continuous. Change every 15 days 6 each 3   FIASP  FLEXTOUCH 100 UNIT/ML FlexTouch Pen Inject into the skin.     gabapentin  (NEURONTIN ) 100 MG capsule Take 1 capsule (100 mg total) by mouth 3 (three) times daily. (Patient taking differently: Take 100 mg by mouth 3 (three) times daily. 200 mg at bedtime) 90 capsule 3   hydrALAZINE (APRESOLINE) 25 MG tablet Take 25 mg by mouth daily.     losartan  (COZAAR ) 100 MG tablet Take 1 tablet (100 mg total) by mouth daily. 30 tablet 0   metFORMIN  (GLUCOPHAGE -XR) 500 MG 24 hr tablet Take 1 tablet (500 mg total) by mouth 2 (two) times daily with a meal. 120 tablet 4   rosuvastatin  (CRESTOR ) 40 MG tablet Take 1 tablet (40 mg total) by mouth daily. 30 tablet 0   TOUJEO  SOLOSTAR 300 UNIT/ML Solostar Pen Inject into the skin.     tiZANidine (ZANAFLEX) 4 MG tablet Take 4 mg by mouth at bedtime as needed for muscle spasms. (Patient not taking: Reported on 04/24/2024)     No current facility-administered medications on file prior to visit.    ALLERGIES: Allergies  Allergen Reactions   Canagliflozin  Other (See Comments)    Dizziness, stabbing pain in right side of body  Other Reaction(s): dizziness, stabbing pain in right side of body  Other Reaction(s): Other (See Comments)    Dizziness, stabbing pain in right side of body   Hydrocodone  Hypertension    Other Reaction(s): Hypertension    FAMILY HISTORY: Family History  Problem Relation Age of Onset   Cancer Mother    Hypertension Mother    Diabetes Mother    Breast cancer Maternal Aunt    Breast cancer Cousin       Objective:  Blood pressure (!) 140/90, pulse 84, height 5' 6 (1.676 m), weight 263 lb (119.3 kg), last menstrual period 08/02/2013, SpO2 99%. General: No acute distress.  Patient appears well-groomed.   Head:  Normocephalic/atraumatic Eyes:  Fundi examined but not visualized Neck: supple, no paraspinal tenderness,  full range of motion Heart:  Regular rate and rhythm Neurological Exam: alert and oriented to person, place, and time.  Speech fluent and not dysarthric, language intact.  Slight left upper and lower  facial weakness.  Reports slight altered sensation to left V2-V3 to touch.  Otherwise, CN II-XII intact. Bulk and tone normal, muscle strength 5/5 throughout.  Pinprick sensation slightly reduced in feet. Vibratory sensation reduced in feet. Deep tendon reflexes 2+ throughout, toes downgoing.  Finger to nose testing intact.  Gait steady.  Romberg with sway.     Juliene Dunnings, DO  CC:  Valery Ripple, MD

## 2024-04-24 NOTE — Patient Instructions (Addendum)
 You are experiencing what is called synkinesis, which is a chronic residual symptom that we see after some people have a bell's palsy.  Increase baclofen  to 10mg  three times daily.

## 2024-04-25 LAB — COMPREHENSIVE METABOLIC PANEL WITH GFR
AG Ratio: 1.4 (calc) (ref 1.0–2.5)
ALT: 15 U/L (ref 6–29)
AST: 18 U/L (ref 10–35)
Albumin: 4.4 g/dL (ref 3.6–5.1)
Alkaline phosphatase (APISO): 57 U/L (ref 37–153)
BUN: 13 mg/dL (ref 7–25)
CO2: 29 mmol/L (ref 20–32)
Calcium: 9.4 mg/dL (ref 8.6–10.4)
Chloride: 104 mmol/L (ref 98–110)
Creat: 0.67 mg/dL (ref 0.50–1.05)
Globulin: 3.2 g/dL (ref 1.9–3.7)
Glucose, Bld: 201 mg/dL — ABNORMAL HIGH (ref 65–99)
Potassium: 4.1 mmol/L (ref 3.5–5.3)
Sodium: 140 mmol/L (ref 135–146)
Total Bilirubin: 0.4 mg/dL (ref 0.2–1.2)
Total Protein: 7.6 g/dL (ref 6.1–8.1)
eGFR: 98 mL/min/1.73m2 (ref >=60)

## 2024-04-25 LAB — MICROALBUMIN / CREATININE URINE RATIO
Creatinine, Urine: 35 mg/dL (ref 20–275)
Microalb Creat Ratio: 931 mg/g{creat} — ABNORMAL HIGH (ref <30)
Microalb, Ur: 32.6 mg/dL

## 2024-04-25 LAB — LIPID PANEL
Cholesterol: 135 mg/dL (ref <200)
HDL: 59 mg/dL (ref >=50)
LDL Cholesterol (Calc): 60 mg/dL
Non-HDL Cholesterol (Calc): 76 mg/dL (ref <130)
Total CHOL/HDL Ratio: 2.3 (calc) (ref <5.0)
Triglycerides: 76 mg/dL (ref <150)

## 2024-04-25 LAB — HEMOGLOBIN A1C
Hgb A1c MFr Bld: 8.7 % — ABNORMAL HIGH (ref <5.7)
Mean Plasma Glucose: 203 mg/dL
eAG (mmol/L): 11.2 mmol/L

## 2024-05-02 ENCOUNTER — Other Ambulatory Visit: Payer: Self-pay

## 2024-05-02 ENCOUNTER — Encounter: Payer: Self-pay | Admitting: "Endocrinology

## 2024-05-02 ENCOUNTER — Ambulatory Visit (INDEPENDENT_AMBULATORY_CARE_PROVIDER_SITE_OTHER): Admitting: "Endocrinology

## 2024-05-02 VITALS — BP 106/80 | HR 84 | Ht 66.0 in | Wt 260.0 lb

## 2024-05-02 DIAGNOSIS — E1165 Type 2 diabetes mellitus with hyperglycemia: Secondary | ICD-10-CM

## 2024-05-02 DIAGNOSIS — Z7984 Long term (current) use of oral hypoglycemic drugs: Secondary | ICD-10-CM

## 2024-05-02 DIAGNOSIS — Z794 Long term (current) use of insulin: Secondary | ICD-10-CM | POA: Diagnosis not present

## 2024-05-02 DIAGNOSIS — E7800 Pure hypercholesterolemia, unspecified: Secondary | ICD-10-CM

## 2024-05-02 MED ORDER — EMPAGLIFLOZIN 10 MG PO TABS: 10.0000 mg | ORAL_TABLET | Freq: Every day | ORAL | Status: DC

## 2024-05-02 MED ORDER — EMPAGLIFLOZIN 10 MG PO TABS: 10.0000 mg | ORAL_TABLET | Freq: Every day | ORAL | 3 refills | Status: DC

## 2024-05-02 NOTE — Progress Notes (Signed)
 Outpatient Endocrinology Note Obadiah Birmingham, MD  05/02/24   Shannon Oliver 1961/06/05 [id number]  Referring Provider: Elliot Charm,* Primary Care Provider: Elliot Charm, MD Reason for consultation: Subjective   Assessment & Plan  There are no diagnoses linked to this encounter.  Diabetes Type II complicated by neuropathy Lab Results  Component Value Date   GFR 64.41 03/31/2023   Hba1c goal less than 7, current Hba1c is  Lab Results  Component Value Date   HGBA1C 8.7 (H) 04/24/2024   Will recommend the following: Toujeo  22 units qam  Fiasp  20 units before break fast and 18-20 units before supper (put alarms to remind 15 min before meals) Metformin  XR 500mg  twice a day (max preferred dose)  05/02/24: Start Jardiance  10 mg every day for microalbuminuria  Gabapentin  for neuropathy Has libre 3 +, dexcom is not covered  No known contraindications/side effects to any of above medications  -Last LD and Tg are as follows: Lab Results  Component Value Date   LDLCALC 60 04/24/2024    Lab Results  Component Value Date   TRIG 76 04/24/2024   -rosuvastatin  40 mg QD -Follow low fat diet and exercise   -Blood pressure goal <140/90 - Microalbumin/creatinine goal is < 30 -Last MA/Cr is as follows: Lab Results  Component Value Date   MICROALBUR 32.6 04/24/2024    - on ACE/ARB losartan  100 mg qd -diet changes including salt restriction -limit eating outside -counseled BP targets per standards of diabetes care -uncontrolled blood pressure can lead to retinopathy, nephropathy and cardiovascular and atherosclerotic heart disease  Reviewed and counseled on: -A1C target -Blood sugar targets -Complications of uncontrolled diabetes  -Checking blood sugar before meals and bedtime and bring log next visit -All medications with mechanism of action and side effects -Hypoglycemia management: rule of 15's, Glucagon Emergency Kit and medical alert  ID -low-carb low-fat plate-method diet -At least 20 minutes of physical activity per day -Annual dilated retinal eye exam and foot exam -compliance and follow up needs -follow up as scheduled or earlier if problem gets worse  Call if blood sugar is less than 70 or consistently above 250    Take a 15 gm snack of carbohydrate at bedtime before you go to sleep if your blood sugar is less than 100.    If you are going to fast after midnight for a test or procedure, ask your physician for instructions on how to reduce/decrease your insulin  dose.    Call if blood sugar is less than 70 or consistently above 250  -Treating a low sugar by rule of 15  (15 gms of sugar every 15 min until sugar is more than 70) If you feel your sugar is low, test your sugar to be sure If your sugar is low (less than 70), then take 15 grams of a fast acting Carbohydrate (3-4 glucose tablets or glucose gel or 4 ounces of juice or regular soda) Recheck your sugar 15 min after treating low to make sure it is more than 70 If sugar is still less than 70, treat again with 15 grams of carbohydrate          Don't drive the hour of hypoglycemia  If unconscious/unable to eat or drink by mouth, use glucagon injection or nasal spray baqsimi and call 911. Can repeat again in 15 min if still unconscious.  Return in about 3 months (around 08/02/2024).   I have reviewed current medications, nurse's notes, allergies, vital signs, past medical and  surgical history, family medical history, and social history for this encounter. Counseled patient on symptoms, examination findings, lab findings, imaging results, treatment decisions and monitoring and prognosis. The patient understood the recommendations and agrees with the treatment plan. All questions regarding treatment plan were fully answered.  Obadiah Birmingham, MD  05/02/24    History of Present Illness Shannon Oliver is a 63 y.o. year old female who presents for evaluation of  Type II diabetes mellitus.  Shannon Oliver was first diagnosed in 2008.   Diabetes education +  Home diabetes regimen: Toujeo  18 units qam  Fiasp  16-20 units before meals  Metformin  XR 500mg  qd-self cut it to current dose   Stopped Insulin  70/30 or fiasp  10-12 units bidac Couldn't get fiasp  8 units tidac/ farxiga /jardiance  10 mg every day  Background history:   She thinks she has been on mostly oral hypoglycemic drugs notably metformin  and Amaryl for most of the duration of her diabetes She was probably started on insulin  in 2018 with Levemir insulin  when her A1c was 9.6 She has not had an endocrinology follow-up since 03/2017  COMPLICATIONS +  MI/Stroke +  retinopathy +  neuropathy -  nephropathy  BLOOD SUGAR DATA CGM interpretation: At today's visit, we reviewed her CGM downloads. The full report is scanned in the media. Reviewing the CGM trends, BG are elevated after break fast and supper.   Physical Exam  BP 106/80   Pulse 84   Ht 5' 6 (1.676 m)   Wt 260 lb (117.9 kg)   LMP 08/02/2013 Comment: spotting  SpO2 96%   BMI 41.97 kg/m    Constitutional: well developed, well nourished Head: normocephalic, atraumatic Eyes: sclera anicteric, no redness Neck: supple Lungs: normal respiratory effort Neurology: alert and oriented Skin: dry, no appreciable rashes Musculoskeletal: no appreciable defects Psychiatric: normal mood and affect Diabetic Foot Exam - Simple   Simple Foot Form Diabetic Foot exam was performed with the following findings: Yes 05/02/2024 11:21 AM  Visual Inspection No deformities, no ulcerations, no other skin breakdown bilaterally: Yes Sensation Testing Intact to touch and monofilament testing bilaterally: Yes Pulse Check Posterior Tibialis and Dorsalis pulse intact bilaterally: Yes Comments + callus, denies podiatry referral      Current Medications Patient's Medications  New Prescriptions   EMPAGLIFLOZIN  (JARDIANCE ) 10 MG TABS TABLET     Take 1 tablet (10 mg total) by mouth daily before breakfast.  Previous Medications   ACCU-CHEK GUIDE TEST STRIP    USE AS INSTRUCTED TO CHECK BLOOD SUGAR TWICE DAILY.   ACETAMINOPHEN  (TYLENOL ) 500 MG TABLET    Take 1,000 mg by mouth every 6 (six) hours as needed for mild pain (pain score 1-3), moderate pain (pain score 4-6) or headache.   ALBUTEROL  (VENTOLIN  HFA) 108 (90 BASE) MCG/ACT INHALER    Inhale 1-2 puffs into the lungs every 6 (six) hours as needed for wheezing or shortness of breath.   AMLODIPINE  (NORVASC ) 10 MG TABLET    Take 1 tablet by mouth once daily.   ASPIRIN  EC 81 MG EC TABLET    Take 1 tablet (81 mg total) by mouth daily. Swallow whole.   BACLOFEN  (LIORESAL ) 10 MG TABLET    Take 1 tablet (10 mg total) by mouth 3 (three) times daily.   CARVEDILOL  (COREG ) 25 MG TABLET    Take 25 mg by mouth. 1 tab in the morning, 1/2 tab at night   CONTINUOUS GLUCOSE SENSOR (FREESTYLE LIBRE 3 PLUS SENSOR) MISC    Inject  1 Device into the skin continuous. Change every 15 days   FIASP  FLEXTOUCH 100 UNIT/ML FLEXTOUCH PEN    Inject into the skin.   GABAPENTIN  (NEURONTIN ) 100 MG CAPSULE    Take 1 capsule (100 mg total) by mouth 3 (three) times daily.   HYDRALAZINE (APRESOLINE) 25 MG TABLET    Take 25 mg by mouth daily.   LOSARTAN  (COZAAR ) 100 MG TABLET    Take 1 tablet (100 mg total) by mouth daily.   METFORMIN  (GLUCOPHAGE -XR) 500 MG 24 HR TABLET    Take 1 tablet (500 mg total) by mouth 2 (two) times daily with a meal.   ROSUVASTATIN  (CRESTOR ) 40 MG TABLET    Take 1 tablet (40 mg total) by mouth daily.   TIZANIDINE (ZANAFLEX) 4 MG TABLET    Take 4 mg by mouth at bedtime as needed for muscle spasms.   TOUJEO  SOLOSTAR 300 UNIT/ML SOLOSTAR PEN    Inject into the skin.  Modified Medications   No medications on file  Discontinued Medications   No medications on file    Allergies Allergies  Allergen Reactions   Canagliflozin  Other (See Comments)    Dizziness, stabbing pain in right side of  body  Other Reaction(s): dizziness, stabbing pain in right side of body  Other Reaction(s): Other (See Comments)    Dizziness, stabbing pain in right side of body   Hydrocodone  Hypertension    Other Reaction(s): Hypertension    Past Medical History Past Medical History:  Diagnosis Date   Abnormal EKG    LVH with strain   Acid reflux    Asthma    Depression    Diabetes mellitus    A1C over 9   HLD (hyperlipidemia)    Hypertension    Noncompliance    Obesity    Pneumonia    Sleep apnea     Past Surgical History Past Surgical History:  Procedure Laterality Date   CARDIAC CATHETERIZATION     CATARACT EXTRACTION Left 06/04/2021   COLONOSCOPY WITH PROPOFOL  N/A 04/29/2015   Procedure: COLONOSCOPY WITH PROPOFOL ;  Surgeon: Gladis MARLA Louder, MD;  Location: [facility];  Service: Endoscopy;  Laterality: N/A;   EYE SURGERY     HYSTEROSCOPY WITH D & C N/A 09/07/2017   Procedure: DILATATION AND CURETTAGE /HYSTEROSCOPY;  Surgeon: Nicholaus Burnard HERO, MD;  Location: [facility];  Service: Gynecology;  Laterality: N/A;   IR ANGIO INTRA EXTRACRAN SEL COM CAROTID INNOMINATE UNI R MOD SED  06/18/2021   IR ANGIO INTRA EXTRACRAN SEL INTERNAL CAROTID BILAT MOD SED  08/19/2021   IR ANGIO INTRA EXTRACRAN SEL INTERNAL CAROTID UNI L MOD SED  06/18/2021   IR ANGIO INTRA EXTRACRAN SEL INTERNAL CAROTID UNI R MOD SED  07/20/2021   IR ANGIO VERTEBRAL SEL VERTEBRAL UNI R MOD SED  06/18/2021   IR ANGIO VERTEBRAL SEL VERTEBRAL UNI R MOD SED  08/19/2021   IR ANGIOGRAM FOLLOW UP STUDY  07/20/2021   IR CT HEAD LTD  07/20/2021   IR RADIOLOGIST EVAL & MGMT  06/19/2021   IR TRANSCATH/EMBOLIZ  07/20/2021   IR US  GUIDE VASC ACCESS RIGHT  06/18/2021   IR US  GUIDE VASC ACCESS RIGHT  08/19/2021   KNEE ARTHROSCOPY W/ MENISCAL REPAIR Right    RADIOLOGY WITH ANESTHESIA N/A 07/20/2021   Procedure: IR WITH ANESTHESIA;  Surgeon: de Macedo Rodrigues, Katyucia, MD;  Location[facility];  Service: Radiology;   Laterality: N/A;    Family History family history includes Breast cancer  in her cousin and maternal aunt; Cancer in her mother; Diabetes in her mother; Hypertension in her mother.  Social History Social History   Socioeconomic History   Marital status: Married    Spouse name: Not on file   Number of children: Not on file   Years of education: Not on file   Highest education level: Not on file  Occupational History   Not on file  Tobacco Use   Smoking status: Never   Smokeless tobacco: Never  Vaping Use   Vaping status: Never Used  Substance and Sexual Activity   Alcohol use: Not Currently    Comment: occ   Drug use: No   Sexual activity: Not on file  Other Topics Concern   Not on file  Social History Narrative   ** Merged History Encounter **       Social Drivers of Health   Financial Resource Strain: Not on file  Food Insecurity: Not on file  Transportation Needs: Not on file  Physical Activity: Not on file  Stress: Not on file  Social Connections: Unknown (11/15/2021)   Received from [employer]   Social Network    Social Network: Not on file  Intimate Partner Violence: Unknown (10/07/2021)   Received from [facility]   HITS    Physically Hurt: Not on file    Insult or Talk Down To: Not on file    Threaten Physical Harm: Not on file    Scream or Curse: Not on file    Lab Results  Component Value Date   HGBA1C 8.7 (H) 04/24/2024   HGBA1C 8.8 (A) 01/19/2024   HGBA1C 8.7 (A) 07/28/2023   Lab Results  Component Value Date   CHOL 135 04/24/2024   Lab Results  Component Value Date   HDL 59 04/24/2024   Lab Results  Component Value Date   LDLCALC 60 04/24/2024   Lab Results  Component Value Date   TRIG 76 04/24/2024   Lab Results  Component Value Date   CHOLHDL 2.3 04/24/2024   Lab Results  Component Value Date   CREATININE 0.67 04/24/2024   Lab Results  Component Value Date   GFR 64.41 03/31/2023   Lab Results  Component Value Date    MICROALBUR 32.6 04/24/2024       Component Value Date/Time   NA 140 04/24/2024 0918   NA 142 08/30/2023 1537   K 4.1 04/24/2024 0918   CL 104 04/24/2024 0918   CO2 29 04/24/2024 0918   GLUCOSE 201 (H) 04/24/2024 0918   BUN 13 04/24/2024 0918   BUN 19 08/30/2023 1537   CREATININE 0.67 04/24/2024 0918   CALCIUM  9.4 04/24/2024 0918   PROT 7.6 04/24/2024 0918   ALBUMIN 3.8 07/25/2023 1824   AST 18 04/24/2024 0918   ALT 15 04/24/2024 0918   ALKPHOS 68 07/25/2023 1824   BILITOT 0.4 04/24/2024 0918   GFRNONAA 57 (L) 07/25/2023 1824   GFRAA >60 08/09/2018 1017      Latest Ref Rng & Units 04/24/2024    9:18 AM 08/30/2023    3:37 PM 07/25/2023    6:26 PM  BMP  Glucose 65 - 99 mg/dL 798  718  782   BUN 7 - 25 mg/dL 13  19  20    Creatinine 0.50 - 1.05 mg/dL 9.32  8.94  8.89   BUN/Creat Ratio 6 - 22 (calc) SEE NOTE:  18    Sodium 135 - 146 mmol/L 140  142  141  Potassium 3.5 - 5.3 mmol/L 4.1  4.3  4.2   Chloride 98 - 110 mmol/L 104  104  105   CO2 20 - 32 mmol/L 29  25    Calcium  8.6 - 10.4 mg/dL 9.4  9.4         Component Value Date/Time   WBC 5.2 07/25/2023 1824   RBC 4.08 07/25/2023 1824   HGB 12.2 07/25/2023 1826   HGB 12.0 05/30/2017 1858   HCT 36.0 07/25/2023 1826   HCT 36.3 05/30/2017 1858   PLT 228 07/25/2023 1824   PLT 248 05/30/2017 1858   MCV 89.7 07/25/2023 1824   MCV 90 05/30/2017 1858   MCH 29.4 07/25/2023 1824   MCHC 32.8 07/25/2023 1824   RDW 13.8 07/25/2023 1824   RDW 13.7 05/30/2017 1858   LYMPHSABS 2.0 07/25/2023 1824   LYMPHSABS 3.2 (H) 05/30/2017 1858   MONOABS 0.6 07/25/2023 1824   EOSABS 0.2 07/25/2023 1824   EOSABS 0.2 05/30/2017 1858   BASOSABS 0.0 07/25/2023 1824   BASOSABS 0.0 05/30/2017 1858     Parts of this note may have been dictated using voice recognition software. There may be variances in spelling and vocabulary which are unintentional. Not all errors are proofread. Please notify the dino if any discrepancies are noted or if  the meaning of any statement is not clear.

## 2024-05-04 ENCOUNTER — Encounter: Payer: Self-pay | Admitting: "Endocrinology

## 2024-05-07 ENCOUNTER — Other Ambulatory Visit: Payer: Self-pay | Admitting: Internal Medicine

## 2024-05-07 DIAGNOSIS — Z1231 Encounter for screening mammogram for malignant neoplasm of breast: Secondary | ICD-10-CM

## 2024-05-08 ENCOUNTER — Other Ambulatory Visit: Payer: Self-pay | Admitting: "Endocrinology

## 2024-05-10 LAB — HM DIABETES EYE EXAM

## 2024-05-23 ENCOUNTER — Ambulatory Visit: Admitting: Neurology

## 2024-05-24 ENCOUNTER — Encounter: Payer: Self-pay | Admitting: Neurology

## 2024-05-28 ENCOUNTER — Ambulatory Visit
Admission: RE | Admit: 2024-05-28 | Discharge: 2024-05-28 | Disposition: A | Source: Ambulatory Visit | Attending: Internal Medicine | Admitting: Internal Medicine

## 2024-05-28 DIAGNOSIS — Z1231 Encounter for screening mammogram for malignant neoplasm of breast: Secondary | ICD-10-CM

## 2024-06-05 ENCOUNTER — Telehealth: Payer: Self-pay

## 2024-06-05 DIAGNOSIS — Z794 Long term (current) use of insulin: Secondary | ICD-10-CM

## 2024-06-05 MED ORDER — DAPAGLIFLOZIN PROPANEDIOL 5 MG PO TABS: 5.0000 mg | ORAL_TABLET | Freq: Every day | ORAL | 3 refills | Status: DC

## 2024-06-05 NOTE — Telephone Encounter (Signed)
 Pt called to report that she does not like taking jardiance  due to too many side effects. Pt is requesting to me put on farxiga .

## 2024-06-05 NOTE — Addendum Note (Signed)
 Addended by: ARLOA JEOFFREY SAILOR on: 06/05/2024 11:58 AM   Modules accepted: Orders

## 2024-06-05 NOTE — Telephone Encounter (Signed)
 Requested Prescriptions   Signed Prescriptions Disp Refills   dapagliflozin  propanediol (FARXIGA ) 5 MG TABS tablet 90 tablet 3    Sig: Take 1 tablet (5 mg total) by mouth daily.    Authorizing Provider: DARTHA ERNST    Ordering User: ARLOA JEOFFREY SAILOR

## 2024-06-13 ENCOUNTER — Telehealth: Payer: Self-pay

## 2024-06-13 NOTE — Telephone Encounter (Signed)
 Error

## 2024-06-15 ENCOUNTER — Telehealth: Payer: Self-pay | Admitting: "Endocrinology

## 2024-06-15 NOTE — Telephone Encounter (Signed)
 Patient came in to office and states that there was a form for her to pick up.  Patient states that she will come back by this afternoon to get the forms since they were not found and all clinical staff were at lunch.  Patient also would like to know if she can get a sample of a meter.

## 2024-06-15 NOTE — Telephone Encounter (Signed)
 Patient came in to office and completed the [employer] Application for Free [employer] Medicines.  The application completed is in Dr. Elliot folder in the front office.

## 2024-06-15 NOTE — Telephone Encounter (Signed)
 I have spoke to pt regarding meter sample, I ask what kind of meter she informed me herlene I told her that the office is currently out of libre meters. Pt assistance paper picked up and I have filled out prescriber part. Paper work on Dr desk to sign.

## 2024-06-19 ENCOUNTER — Telehealth: Payer: Self-pay

## 2024-06-19 NOTE — Telephone Encounter (Signed)
 Pt patient assistance was delivered today( [employer] ) . Pt notified

## 2024-06-21 NOTE — Telephone Encounter (Signed)
 Patient picked up patient assistance - Log Noted [employer]  06/21/24

## 2024-07-09 DIAGNOSIS — E1165 Type 2 diabetes mellitus with hyperglycemia: Secondary | ICD-10-CM

## 2024-07-09 MED ORDER — DAPAGLIFLOZIN PROPANEDIOL 5 MG PO TABS: 5.0000 mg | ORAL_TABLET | Freq: Every day | ORAL | 3 refills | Status: AC

## 2024-08-02 ENCOUNTER — Other Ambulatory Visit: Payer: Self-pay | Admitting: Neurology

## 2024-08-02 ENCOUNTER — Ambulatory Visit: Admitting: "Endocrinology

## 2024-09-04 ENCOUNTER — Ambulatory Visit: Admitting: "Endocrinology

## 2024-11-19 ENCOUNTER — Ambulatory Visit: Admitting: Neurology

## 2024-11-27 ENCOUNTER — Ambulatory Visit: Admitting: Neurology
